# Patient Record
Sex: Female | Born: 1985 | Race: Black or African American | Hispanic: No | Marital: Married | Smoking: Never smoker
Health system: Southern US, Community
[De-identification: ages and names within clinical notes are randomized; demographics above are authoritative.]

## PROBLEM LIST (undated history)

## (undated) ENCOUNTER — Inpatient Hospital Stay (HOSPITAL_COMMUNITY): Payer: Self-pay

## (undated) DIAGNOSIS — B009 Herpesviral infection, unspecified: Secondary | ICD-10-CM

## (undated) DIAGNOSIS — F419 Anxiety disorder, unspecified: Secondary | ICD-10-CM

## (undated) DIAGNOSIS — I2699 Other pulmonary embolism without acute cor pulmonale: Secondary | ICD-10-CM

## (undated) DIAGNOSIS — I10 Essential (primary) hypertension: Secondary | ICD-10-CM

## (undated) DIAGNOSIS — IMO0002 Reserved for concepts with insufficient information to code with codable children: Secondary | ICD-10-CM

## (undated) DIAGNOSIS — R0602 Shortness of breath: Secondary | ICD-10-CM

## (undated) DIAGNOSIS — J189 Pneumonia, unspecified organism: Secondary | ICD-10-CM

## (undated) DIAGNOSIS — A64 Unspecified sexually transmitted disease: Secondary | ICD-10-CM

## (undated) DIAGNOSIS — M329 Systemic lupus erythematosus, unspecified: Secondary | ICD-10-CM

## (undated) DIAGNOSIS — N390 Urinary tract infection, site not specified: Secondary | ICD-10-CM

## (undated) DIAGNOSIS — B999 Unspecified infectious disease: Secondary | ICD-10-CM

## (undated) DIAGNOSIS — F32A Depression, unspecified: Secondary | ICD-10-CM

## (undated) DIAGNOSIS — R51 Headache: Secondary | ICD-10-CM

## (undated) HISTORY — DX: Unspecified sexually transmitted disease: A64

## (undated) HISTORY — DX: Shortness of breath: R06.02

## (undated) HISTORY — DX: Systemic lupus erythematosus, unspecified: M32.9

## (undated) HISTORY — PX: WISDOM TOOTH EXTRACTION: SHX21

## (undated) HISTORY — DX: Herpesviral infection, unspecified: B00.9

## (undated) HISTORY — DX: Other pulmonary embolism without acute cor pulmonale: I26.99

## (undated) HISTORY — DX: Reserved for concepts with insufficient information to code with codable children: IMO0002

## (undated) HISTORY — PX: ADENOIDECTOMY: SUR15

## (undated) SURGERY — SALPINGECTOMY, ROBOT-ASSISTED
Anesthesia: General | Laterality: Bilateral

---
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
## 2008-09-29 ENCOUNTER — Emergency Department (HOSPITAL_COMMUNITY): Admission: EM | Admit: 2008-09-29 | Discharge: 2008-09-29 | Payer: Self-pay | Admitting: Emergency Medicine

## 2008-11-15 ENCOUNTER — Emergency Department (HOSPITAL_COMMUNITY): Admission: EM | Admit: 2008-11-15 | Discharge: 2008-11-15 | Payer: Self-pay | Admitting: Emergency Medicine

## 2008-11-21 ENCOUNTER — Inpatient Hospital Stay (HOSPITAL_COMMUNITY): Admission: AD | Admit: 2008-11-21 | Discharge: 2008-11-21 | Payer: Self-pay | Admitting: Family Medicine

## 2008-11-22 ENCOUNTER — Inpatient Hospital Stay (HOSPITAL_COMMUNITY): Admission: AD | Admit: 2008-11-22 | Discharge: 2008-11-22 | Payer: Self-pay | Admitting: Family Medicine

## 2008-11-23 ENCOUNTER — Inpatient Hospital Stay (HOSPITAL_COMMUNITY): Admission: AD | Admit: 2008-11-23 | Discharge: 2008-11-23 | Payer: Self-pay | Admitting: Obstetrics & Gynecology

## 2008-12-10 ENCOUNTER — Ambulatory Visit: Payer: Self-pay | Admitting: Obstetrics and Gynecology

## 2008-12-17 ENCOUNTER — Ambulatory Visit: Payer: Self-pay | Admitting: Obstetrics and Gynecology

## 2009-01-16 ENCOUNTER — Ambulatory Visit: Payer: Self-pay | Admitting: Physician Assistant

## 2009-01-16 ENCOUNTER — Inpatient Hospital Stay (HOSPITAL_COMMUNITY): Admission: AD | Admit: 2009-01-16 | Discharge: 2009-01-16 | Payer: Self-pay | Admitting: Obstetrics and Gynecology

## 2009-02-21 DIAGNOSIS — A64 Unspecified sexually transmitted disease: Secondary | ICD-10-CM

## 2009-02-21 HISTORY — DX: Unspecified sexually transmitted disease: A64

## 2009-03-17 ENCOUNTER — Encounter: Payer: Self-pay | Admitting: Women's Health

## 2009-03-17 ENCOUNTER — Ambulatory Visit: Payer: Self-pay | Admitting: Women's Health

## 2009-03-17 ENCOUNTER — Other Ambulatory Visit: Admission: RE | Admit: 2009-03-17 | Discharge: 2009-03-17 | Payer: Self-pay | Admitting: Obstetrics and Gynecology

## 2009-04-21 ENCOUNTER — Ambulatory Visit: Payer: Self-pay | Admitting: Women's Health

## 2009-06-08 ENCOUNTER — Ambulatory Visit: Payer: Self-pay | Admitting: Women's Health

## 2009-08-19 ENCOUNTER — Inpatient Hospital Stay (HOSPITAL_COMMUNITY): Admission: AD | Admit: 2009-08-19 | Discharge: 2009-08-19 | Payer: Self-pay | Admitting: Obstetrics & Gynecology

## 2009-12-27 ENCOUNTER — Ambulatory Visit: Payer: Self-pay | Admitting: Women's Health

## 2010-04-12 ENCOUNTER — Emergency Department (HOSPITAL_COMMUNITY): Admission: EM | Admit: 2010-04-12 | Discharge: 2010-04-13 | Payer: Self-pay | Admitting: Emergency Medicine

## 2010-09-15 ENCOUNTER — Encounter: Payer: Self-pay | Admitting: Women's Health

## 2010-10-09 LAB — URINALYSIS, ROUTINE W REFLEX MICROSCOPIC
Bilirubin Urine: NEGATIVE
Glucose, UA: NEGATIVE mg/dL
Hgb urine dipstick: NEGATIVE
Specific Gravity, Urine: 1.025 (ref 1.005–1.030)
pH: 5.5 (ref 5.0–8.0)

## 2010-10-09 LAB — GC/CHLAMYDIA PROBE AMP, GENITAL: GC Probe Amp, Genital: NEGATIVE

## 2010-10-09 LAB — WET PREP, GENITAL
Trich, Wet Prep: NONE SEEN
Yeast Wet Prep HPF POC: NONE SEEN

## 2010-10-09 LAB — POCT PREGNANCY, URINE: Preg Test, Ur: NEGATIVE

## 2010-10-31 LAB — URINALYSIS, ROUTINE W REFLEX MICROSCOPIC
Bilirubin Urine: NEGATIVE
Glucose, UA: NEGATIVE mg/dL
Hgb urine dipstick: NEGATIVE
Protein, ur: NEGATIVE mg/dL
Specific Gravity, Urine: >1.03 — ABNORMAL HIGH (ref 1.005–1.030)
Urobilinogen, UA: 1 mg/dL (ref 0.0–1.0)

## 2010-10-31 LAB — WET PREP, GENITAL
Trich, Wet Prep: NONE SEEN
Yeast Wet Prep HPF POC: NONE SEEN

## 2010-10-31 LAB — GC/CHLAMYDIA PROBE AMP, GENITAL: GC Probe Amp, Genital: NEGATIVE

## 2010-11-01 LAB — URINALYSIS, ROUTINE W REFLEX MICROSCOPIC
Glucose, UA: NEGATIVE mg/dL
Glucose, UA: NEGATIVE mg/dL
Ketones, ur: NEGATIVE mg/dL
Leukocytes, UA: NEGATIVE
Protein, ur: NEGATIVE mg/dL
Protein, ur: NEGATIVE mg/dL
Specific Gravity, Urine: 1.02 (ref 1.005–1.030)
Urobilinogen, UA: 0.2 mg/dL (ref 0.0–1.0)
pH: 6 (ref 5.0–8.0)

## 2010-11-01 LAB — URINE MICROSCOPIC-ADD ON

## 2010-11-01 LAB — CBC
Hemoglobin: 13.1 g/dL (ref 12.0–15.0)
RBC: 4.31 MIL/uL (ref 3.87–5.11)
WBC: 5.3 10*3/uL (ref 4.0–10.5)

## 2010-11-01 LAB — HCG, QUANTITATIVE, PREGNANCY: hCG, Beta Chain, Quant, S: 638 m[IU]/mL — ABNORMAL HIGH (ref <5)

## 2010-11-02 LAB — HCG, QUANTITATIVE, PREGNANCY: hCG, Beta Chain, Quant, S: 2670 m[IU]/mL — ABNORMAL HIGH (ref <5)

## 2010-11-02 LAB — URINE MICROSCOPIC-ADD ON

## 2010-11-02 LAB — COMPREHENSIVE METABOLIC PANEL
CO2: 28 mEq/L (ref 19–32)
Calcium: 9.2 mg/dL (ref 8.4–10.5)
Creatinine, Ser: 0.64 mg/dL (ref 0.4–1.2)
GFR calc Af Amer: >60 mL/min (ref >60)
GFR calc non Af Amer: >60 mL/min (ref >60)
Glucose, Bld: 91 mg/dL (ref 70–99)
Sodium: 137 mEq/L (ref 135–145)
Total Protein: 7.5 g/dL (ref 6.0–8.3)

## 2010-11-02 LAB — URINALYSIS, ROUTINE W REFLEX MICROSCOPIC
Glucose, UA: NEGATIVE mg/dL
Ketones, ur: NEGATIVE mg/dL
Nitrite: NEGATIVE
Specific Gravity, Urine: 1.026 (ref 1.005–1.030)
pH: 7.5 (ref 5.0–8.0)

## 2010-11-02 LAB — GC/CHLAMYDIA PROBE AMP, GENITAL
Chlamydia, DNA Probe: NEGATIVE
GC Probe Amp, Genital: NEGATIVE

## 2010-11-02 LAB — DIFFERENTIAL
Lymphocytes Relative: 43 % (ref 12–46)
Lymphs Abs: 3 10*3/uL (ref 0.7–4.0)
Monocytes Relative: 7 % (ref 3–12)
Neutrophils Relative %: 46 % (ref 43–77)

## 2010-11-02 LAB — CBC
MCHC: 33.3 g/dL (ref 30.0–36.0)
MCV: 89.5 fL (ref 78.0–100.0)
RBC: 4.23 MIL/uL (ref 3.87–5.11)
RDW: 14.4 % (ref 11.5–15.5)

## 2010-11-02 LAB — WET PREP, GENITAL: Yeast Wet Prep HPF POC: NONE SEEN

## 2010-11-03 LAB — POCT PREGNANCY, URINE: Preg Test, Ur: NEGATIVE

## 2010-12-06 NOTE — Group Therapy Note (Signed)
NAME:  Hayley Webb, Hayley Webb NO.:  [id number]   MEDICAL RECORD NO.:  [id number]          PATIENT TYPE:  WOC   LOCATION:  [facility]                   FACILITY:  [facility]   PHYSICIAN:  Argentina Donovan, MD        DATE OF BIRTH:  1986/03/30   DATE OF SERVICE:                                  CLINIC NOTE   REASON FOR VISIT:  Followup SAB on Nov 23, 2008.   HISTORY:  This is a G1, P0-0-1-0, who desires pregnancy and was seen Nov 21, 2008, cramping, had a viable IUP 5 weeks 6 days.  Then on Nov 23, 2008, she was seen with heavy bleeding and her ultrasound then showed  distended endocervical canal with 1.6 cm debris consistent with SAB in  progress.  Since that time, she continued to have some bleeding and  spotting until it stopped a week ago.  Then after a few days, she again  had some moderate amount of bleeding for couple of days, and then light  spotting which stopped yesterday.  She has not seen anything today.   ALLERGIES:  None.   MEDICATIONS:  Albuterol p.r.n.   IMMUNIZATIONS:  All the usual childhood immunizations including flu.   OB HISTORY:  Her menses are q.28 days in regular with moderate flow.  No  intermenstrual bleeding usually.  She is not using contraception.  She  has never had abnormal Pap smears or STIs.  No surgeries.   FAMILY HISTORY:  Significant for grandmother with diabetes.  Grandfather  with hypertension.  Grandmother, paternal, and grandmother, maternal  both with pancreas and lung cancer.   PAST MEDICAL HISTORY:  Essentially negative except for asthma.   SOCIAL HISTORY:  She lives with her mother and brother, is employed,  nonsmoker, nondrinker.  Drinks 1-2 caffeinated beverages a day.  No  history of abuse.   REVIEW OF SYSTEMS:  Essentially negative, although she has had some  nausea, vomiting with the pregnancy.  As noted above, has had some brown  vaginal spotting that she feels as a bad odor.   PHYSICAL EXAMINATION:  VITAL SIGNS:  BP  124/85, weight 286 pounds,  height 5 feet 8 inches.  GENERAL:  Pleasant, NAD.  HEENT:  Normocephalic.  ABDOMEN:  Obese, soft, and nontender.  PELVIC:  NEFG, BUS negative.  Speculum exam, vagina and cervix clean  except for scant of amount brown old blood noted. On insertion of Q-Tip  at external os, essentially no vaginal discharge.  Bimanual cervix,  nulliparous, long, closed.  Uterus, NSSP.  Adnexa, no tenderness or  masses.   ASSESSMENT:  Status post completed spontaneous abortion.   PLAN:  RX, prenatal vitamin to start one a day, advised to keep a  menstrual calendar and to wait one normal cycle before attempting  pregnancy again. Of note, she plans to go to [facility] when  she becomes pregnant.     ______________________________  Caren Griffins, [name]    ______________________________  Argentina Donovan, MD    DP/MEDQ  D:  12/10/2008  T:  12/11/2008  Job:  [id number]

## 2011-01-08 ENCOUNTER — Emergency Department (HOSPITAL_COMMUNITY)
Admission: EM | Admit: 2011-01-08 | Discharge: 2011-01-08 | Disposition: A | Discharge status: Home / Self Care | Payer: BC Managed Care – PPO | Attending: Emergency Medicine | Admitting: Emergency Medicine

## 2011-01-08 ENCOUNTER — Emergency Department (HOSPITAL_COMMUNITY): Payer: BC Managed Care – PPO

## 2011-01-08 DIAGNOSIS — IMO0001 Reserved for inherently not codable concepts without codable children: Secondary | ICD-10-CM | POA: Insufficient documentation

## 2011-01-08 DIAGNOSIS — J069 Acute upper respiratory infection, unspecified: Secondary | ICD-10-CM | POA: Insufficient documentation

## 2011-01-08 DIAGNOSIS — J45909 Unspecified asthma, uncomplicated: Secondary | ICD-10-CM | POA: Insufficient documentation

## 2011-01-08 DIAGNOSIS — M329 Systemic lupus erythematosus, unspecified: Secondary | ICD-10-CM | POA: Insufficient documentation

## 2011-01-08 DIAGNOSIS — R0602 Shortness of breath: Secondary | ICD-10-CM | POA: Insufficient documentation

## 2011-01-26 ENCOUNTER — Ambulatory Visit (INDEPENDENT_AMBULATORY_CARE_PROVIDER_SITE_OTHER): Payer: BC Managed Care – PPO | Admitting: Gynecology

## 2011-01-26 DIAGNOSIS — N898 Other specified noninflammatory disorders of vagina: Secondary | ICD-10-CM

## 2011-01-26 DIAGNOSIS — Z113 Encounter for screening for infections with a predominantly sexual mode of transmission: Secondary | ICD-10-CM

## 2011-01-26 DIAGNOSIS — R82998 Other abnormal findings in urine: Secondary | ICD-10-CM

## 2011-01-26 DIAGNOSIS — N949 Unspecified condition associated with female genital organs and menstrual cycle: Secondary | ICD-10-CM

## 2011-01-26 DIAGNOSIS — B373 Candidiasis of vulva and vagina: Secondary | ICD-10-CM

## 2011-02-03 ENCOUNTER — Encounter: Payer: BC Managed Care – PPO | Admitting: Women's Health

## 2011-03-21 DIAGNOSIS — B009 Herpesviral infection, unspecified: Secondary | ICD-10-CM | POA: Insufficient documentation

## 2011-03-21 DIAGNOSIS — J45909 Unspecified asthma, uncomplicated: Secondary | ICD-10-CM | POA: Insufficient documentation

## 2011-03-29 ENCOUNTER — Other Ambulatory Visit: Payer: Self-pay

## 2011-03-29 ENCOUNTER — Ambulatory Visit (INDEPENDENT_AMBULATORY_CARE_PROVIDER_SITE_OTHER): Payer: BC Managed Care – PPO | Admitting: Women's Health

## 2011-03-29 ENCOUNTER — Encounter: Payer: Self-pay | Admitting: Women's Health

## 2011-03-29 ENCOUNTER — Other Ambulatory Visit (HOSPITAL_COMMUNITY)
Admission: RE | Admit: 2011-03-29 | Discharge: 2011-03-29 | Disposition: A | Discharge status: Home / Self Care | Payer: BC Managed Care – PPO | Source: Ambulatory Visit | Attending: Women's Health | Admitting: Women's Health

## 2011-03-29 VITALS — BP 130/72 | Ht 69.25 in | Wt 310.0 lb

## 2011-03-29 DIAGNOSIS — F419 Anxiety disorder, unspecified: Secondary | ICD-10-CM

## 2011-03-29 DIAGNOSIS — Z23 Encounter for immunization: Secondary | ICD-10-CM

## 2011-03-29 DIAGNOSIS — N920 Excessive and frequent menstruation with regular cycle: Secondary | ICD-10-CM

## 2011-03-29 DIAGNOSIS — Z01419 Encounter for gynecological examination (general) (routine) without abnormal findings: Secondary | ICD-10-CM | POA: Insufficient documentation

## 2011-03-29 DIAGNOSIS — F411 Generalized anxiety disorder: Secondary | ICD-10-CM

## 2011-03-29 DIAGNOSIS — B009 Herpesviral infection, unspecified: Secondary | ICD-10-CM

## 2011-03-29 DIAGNOSIS — B373 Candidiasis of vulva and vagina: Secondary | ICD-10-CM

## 2011-03-29 DIAGNOSIS — N760 Acute vaginitis: Secondary | ICD-10-CM

## 2011-03-29 DIAGNOSIS — R110 Nausea: Secondary | ICD-10-CM

## 2011-03-29 DIAGNOSIS — A499 Bacterial infection, unspecified: Secondary | ICD-10-CM

## 2011-03-29 DIAGNOSIS — M329 Systemic lupus erythematosus, unspecified: Secondary | ICD-10-CM | POA: Insufficient documentation

## 2011-03-29 DIAGNOSIS — N898 Other specified noninflammatory disorders of vagina: Secondary | ICD-10-CM

## 2011-03-29 DIAGNOSIS — Z113 Encounter for screening for infections with a predominantly sexual mode of transmission: Secondary | ICD-10-CM

## 2011-03-29 MED ORDER — METRONIDAZOLE 0.75 % VA GEL: VAGINAL | Status: AC

## 2011-03-29 MED ORDER — CITALOPRAM HYDROBROMIDE 40 MG PO TABS: 40.0000 mg | ORAL_TABLET | Freq: Every day | ORAL | Status: DC

## 2011-03-29 MED ORDER — VALACYCLOVIR HCL 500 MG PO TABS: 500.0000 mg | ORAL_TABLET | Freq: Two times a day (BID) | ORAL | Status: DC

## 2011-03-29 MED ORDER — FLUCONAZOLE 150 MG PO TABS: 150.0000 mg | ORAL_TABLET | Freq: Once | ORAL | Status: AC

## 2011-03-29 NOTE — Progress Notes (Signed)
Hayley Webb 08/21/1985 [id number]    History:    The patient presents for annual exam.  She has been diagnosed with lupus this past year. She has a history of asthma and depression. She is a CNA in an Alzheimer's unit.  Past medical history, past surgical history, family history and social history were all reviewed and documented in the EPIC chart.   ROS:  A  ROS was performed and pertinent positives and negatives are included in the history.  Exam:  There were no vitals filed for this visit.  General appearance: Obese Head/Neck:  Normal, without cervical or supraclavicular adenopathy. Thyroid:  Symmetrical, normal in size, without palpable masses or nodularity. Respiratory  Effort:  Normal  Auscultation:  Clear without wheezing or rhonchi Cardiovascular  Auscultation:  Regular rate, without rubs, murmurs or gallops  Edema/varicosities:  Not grossly evident Abdominal  Soft,nontender, without masses, guarding or rebound.  Liver/spleen:  No organomegaly noted  Hernia:  None appreciated  Skin  Inspection:  Grossly normal  Palpation:  Grossly normal Neurologic/psychiatric  Orientation:  Normal with appropriate conversation.  Mood/affect:  Normal  Genitourinary    Breasts: Examined lying and sitting.     Right: Without masses, retractions, discharge or axillary adenopathy.     Left: Without masses, retractions, discharge or axillary adenopathy.   Inguinal/mons:  Normal without inguinal adenopathy  External genitalia:  Normal  BUS/Urethra/Skene's glands:  Normal  Bladder:  Normal  Vagina:  Normal  Cervix:  Normal  Uterus:  difficult to palpate size due to obesity,  Midline and mobile  Adnexa/parametria:     Rt: Without masses or tenderness.   Lt: Without masses or tenderness.  Anus and perineum: Normal  Digital rectal exam: Normal sphincter tone without palpated masses or tenderness  Assessment/Plan:  25 y.o. SBF G1P0 for annual exam.  Monthly 7 day cycle 4 of which  are heavy, condoms for contraception. She states her cycles have become heavier in the past 2 years. Also complained of increased discharge.  BV and yeast, obesity, Lupus, Asthma, Depression  Plan: MetroGel vaginal cream 1 applicator at bedtime x5, and Diflucan 150 by mouth x1 dose. Prescriptions and instructions were given and reviewed. She does have a history of lupus and is on plaque on no twice a day, and did review Mirena IUDs, to help  menorrhagia and for contraception. Reviewed slight risk for perforation hemorrhage infection. Will schedule an ultrasound after her next cycle to assess size of uterus due to an inadequate pelvic exam. She does have a history of HSV, rare outbreaks, prescription for Valtrex 500 by mouth twice a day for 3-5 days when necessary was given. Third and final Gardasil was given today. SBEs, exercise, cutting calories, Weight Watchers for weight loss was encouraged. Pap, GC and Chlamydia only today declines need for HIV hep B,C or RPR she has had them drawn at work and were negative. She is currently on Celexa 40 daily which has kept her anxiety depression stable, and plaqunil for lupus per PC. Labs and medications are through her primary care.  Harrington Challenger [facility], 9:34 AM 03/29/2011

## 2011-03-30 LAB — POCT URINE PREGNANCY: Preg Test, Ur: NEGATIVE

## 2011-07-24 ENCOUNTER — Emergency Department (HOSPITAL_COMMUNITY)
Admission: EM | Admit: 2011-07-24 | Discharge: 2011-07-25 | Discharge status: Against Medical Advice | Payer: BC Managed Care – PPO | Attending: Emergency Medicine | Admitting: Emergency Medicine

## 2011-07-24 ENCOUNTER — Encounter (HOSPITAL_COMMUNITY): Payer: Self-pay | Admitting: Nurse Practitioner

## 2011-07-24 DIAGNOSIS — Z0389 Encounter for observation for other suspected diseases and conditions ruled out: Secondary | ICD-10-CM | POA: Insufficient documentation

## 2011-07-24 NOTE — ED Notes (Signed)
Pt c/o body aches, chills, cough onset yesterday. A&Ox4, resp e/u

## 2011-07-24 NOTE — ED Notes (Signed)
Called patient no answer.

## 2011-09-10 ENCOUNTER — Inpatient Hospital Stay (HOSPITAL_COMMUNITY): Payer: BC Managed Care – PPO

## 2011-09-10 ENCOUNTER — Encounter (HOSPITAL_COMMUNITY): Payer: Self-pay | Admitting: *Deleted

## 2011-09-10 ENCOUNTER — Inpatient Hospital Stay (HOSPITAL_COMMUNITY)
Admission: AD | Admit: 2011-09-10 | Discharge: 2011-09-10 | Disposition: A | Discharge status: Home / Self Care | Payer: BC Managed Care – PPO | Source: Ambulatory Visit | Attending: Gynecology | Admitting: Gynecology

## 2011-09-10 DIAGNOSIS — B009 Herpesviral infection, unspecified: Secondary | ICD-10-CM | POA: Insufficient documentation

## 2011-09-10 DIAGNOSIS — J45909 Unspecified asthma, uncomplicated: Secondary | ICD-10-CM | POA: Insufficient documentation

## 2011-09-10 DIAGNOSIS — N938 Other specified abnormal uterine and vaginal bleeding: Secondary | ICD-10-CM | POA: Insufficient documentation

## 2011-09-10 DIAGNOSIS — N949 Unspecified condition associated with female genital organs and menstrual cycle: Secondary | ICD-10-CM | POA: Insufficient documentation

## 2011-09-10 DIAGNOSIS — N926 Irregular menstruation, unspecified: Secondary | ICD-10-CM

## 2011-09-10 DIAGNOSIS — M329 Systemic lupus erythematosus, unspecified: Secondary | ICD-10-CM | POA: Insufficient documentation

## 2011-09-10 LAB — URINALYSIS, ROUTINE W REFLEX MICROSCOPIC
Ketones, ur: NEGATIVE mg/dL
Leukocytes, UA: NEGATIVE
Nitrite: NEGATIVE
Protein, ur: NEGATIVE mg/dL
Urobilinogen, UA: 0.2 mg/dL (ref 0.0–1.0)
pH: 6 (ref 5.0–8.0)

## 2011-09-10 LAB — WET PREP, GENITAL
Clue Cells Wet Prep HPF POC: NONE SEEN
Trich, Wet Prep: NONE SEEN

## 2011-09-10 LAB — CBC
MCHC: 31.6 g/dL (ref 30.0–36.0)
Platelets: 335 10*3/uL (ref 150–400)
RDW: 13.9 % (ref 11.5–15.5)
WBC: 6 10*3/uL (ref 4.0–10.5)

## 2011-09-10 MED ORDER — IBUPROFEN 800 MG PO TABS: 800.0000 mg | ORAL_TABLET | Freq: Three times a day (TID) | ORAL | Status: AC | PRN

## 2011-09-10 MED ORDER — OXYCODONE-ACETAMINOPHEN 5-325 MG PO TABS
1.0000 | ORAL_TABLET | Freq: Once | ORAL | Status: AC
  Administered 2011-09-10: 1 via ORAL
  Filled 2011-09-10: qty 2

## 2011-09-10 MED ORDER — KETOROLAC TROMETHAMINE 60 MG/2ML IM SOLN
60.0000 mg | Freq: Once | INTRAMUSCULAR | Status: AC
  Administered 2011-09-10: 60 mg via INTRAMUSCULAR
  Filled 2011-09-10: qty 2

## 2011-09-10 NOTE — ED Provider Notes (Signed)
History     Chief Complaint  Patient presents with  . Vaginal Bleeding  . Abdominal Pain  . Back Pain   HPI Care assumed from Pamelia Hoit, NP, please see her note.     Past Medical History  Diagnosis Date  . 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 HSV infection   . Asthma   . Lupus     Past Surgical History  Procedure Date  . Tonsillectomy and adenoidectomy 1998  . Mouth surgery     2 teeth removed- 1 wisdom and 1 in front of it    Family History  Problem Relation Age of Onset  . Diabetes Maternal Aunt   . Cancer Maternal Grandmother 72    COLON CA  . Diabetes Maternal Grandmother   . Hypertension Maternal Grandfather   . Asthma Mother   . Asthma Brother   . Anesthesia problems Neg Hx   . Hypotension Neg Hx   . Malignant hyperthermia Neg Hx   . Pseudochol deficiency Neg Hx     History  Substance Use Topics  . Smoking status: Never Smoker   . Smokeless tobacco: Never Used  . Alcohol Use: No    Allergies:  Allergies  Allergen Reactions  . Prednisone Swelling and Rash    Prescriptions prior to admission  Medication Sig Dispense Refill  . albuterol (PROVENTIL HFA;VENTOLIN HFA) 108 (90 BASE) MCG/ACT inhaler Inhale 2 puffs into the lungs every 6 (six) hours as needed. For shortness of breath       . citalopram (CELEXA) 40 MG tablet Take 40 mg by mouth daily.        . diphenhydramine-acetaminophen (TYLENOL PM) 25-500 MG TABS Take 1 tablet by mouth at bedtime as needed. For sleep      . Hydroxychloroquine Sulfate (PLAQUENIL PO) Take 10 mg by mouth daily.       . methylPREDNISolone (MEDROL) 8 MG tablet Take 8 mg by mouth daily.        ROS Physical Exam   Blood pressure 136/83, pulse 83, temperature 97.1 F (36.2 C), temperature source Oral, resp. rate 18, height 5\' 9"  (1.753 m), last menstrual period 08/26/2011.  Physical Exam  MAU Course  Procedures Results for orders placed during the hospital encounter of 09/10/11 (from  the past 24 hour(s))  URINALYSIS, ROUTINE W REFLEX MICROSCOPIC     Status: Abnormal   Collection Time   09/10/11  4:25 PM      Component Value Range   Color, Urine YELLOW  YELLOW    APPearance HAZY (*) CLEAR    Specific Gravity, Urine >1.030 (*) 1.005 - 1.030    pH 6.0  5.0 - 8.0    Glucose, UA NEGATIVE  NEGATIVE (mg/dL)   Hgb urine dipstick LARGE (*) NEGATIVE    Bilirubin Urine NEGATIVE  NEGATIVE    Ketones, ur NEGATIVE  NEGATIVE (mg/dL)   Protein, ur NEGATIVE  NEGATIVE (mg/dL)   Urobilinogen, UA 0.2  0.0 - 1.0 (mg/dL)   Nitrite NEGATIVE  NEGATIVE    Leukocytes, UA NEGATIVE  NEGATIVE   URINE MICROSCOPIC-ADD ON     Status: Abnormal   Collection Time   09/10/11  4:25 PM      Component Value Range   Squamous Epithelial / LPF FEW (*) RARE    WBC, UA 0-2  <3 (WBC/hpf)   RBC / HPF 21-50  <3 (RBC/hpf)   Bacteria, UA FEW (*) RARE   POCT PREGNANCY, URINE  Status: Normal   Collection Time   09/10/11  4:29 PM      Component Value Range   Preg Test, Ur NEGATIVE  NEGATIVE   WET PREP, GENITAL     Status: Abnormal   Collection Time   09/10/11  5:27 PM      Component Value Range   Yeast Wet Prep HPF POC NONE SEEN  NONE SEEN    Trich, Wet Prep NONE SEEN  NONE SEEN    Clue Cells Wet Prep HPF POC NONE SEEN  NONE SEEN    WBC, Wet Prep HPF POC FEW (*) NONE SEEN   CBC     Status: Abnormal   Collection Time   09/10/11  5:45 PM      Component Value Range   WBC 6.0  4.0 - 10.5 (K/uL)   RBC 4.09  3.87 - 5.11 (MIL/uL)   Hemoglobin 11.5 (*) 12.0 - 15.0 (g/dL)   HCT 16.1  09.6 - 04.5 (%)   MCV 89.0  78.0 - 100.0 (fL)   MCH 28.1  26.0 - 34.0 (pg)   MCHC 31.6  30.0 - 36.0 (g/dL)   RDW 40.9  81.1 - 91.4 (%)   Platelets 335  150 - 400 (K/uL)   US Transvaginal Non-ob  09/10/2011  *RADIOLOGY REPORT*  Clinical Data: Hematuria.  Pelvic pain.  TRANSABDOMINAL AND TRANSVAGINAL ULTRASOUND OF PELVIS Technique:  Both transabdominal and transvaginal ultrasound examinations of the pelvis were performed.  Transabdominal technique was performed for global imaging of the pelvis including uterus, ovaries, adnexal regions, and pelvic cul-de-sac.  Comparison: Previous OB ultrasound.   It was necessary to proceed with endovaginal exam following the transabdominal exam to visualize the .  Findings:  Uterus: Normal in size and appearance  Endometrium: Normal in thickness and appearance  Right ovary:  Normal appearance/no adnexal mass  Left ovary: Normal appearance/no adnexal mass  Other findings: No free fluid  IMPRESSION: Normal study. No evidence of pelvic mass or other significant abnormality.  Original Report Authenticated By: Darrol Angel, M.D.   US Pelvis Complete  09/10/2011  *RADIOLOGY REPORT*  Clinical Data: Hematuria.  Pelvic pain.  TRANSABDOMINAL AND TRANSVAGINAL ULTRASOUND OF PELVIS Technique:  Both transabdominal and transvaginal ultrasound examinations of the pelvis were performed. Transabdominal technique was performed for global imaging of the pelvis including uterus, ovaries, adnexal regions, and pelvic cul-de-sac.  Comparison: Previous OB ultrasound.   It was necessary to proceed with endovaginal exam following the transabdominal exam to visualize the .  Findings:  Uterus: Normal in size and appearance  Endometrium: Normal in thickness and appearance  Right ovary:  Normal appearance/no adnexal mass  Left ovary: Normal appearance/no adnexal mass  Other findings: No free fluid  IMPRESSION: Normal study. No evidence of pelvic mass or other significant abnormality.  Original Report Authenticated By: Darrol Angel, M.D.   Pain improved with Toradol 60 mg IM, but increased after ultrasound, gave Percocet 5/325 x 1 prior to d/c  Attempted to contact Dr. Tresa Res by pager - no response, pt not acute, all results normal, she states that she usually sees Maryelizabeth Rowan, NP in the office and could follow up with her    Assessment and Plan  26 y.o. G1P0010 with DUB, pelvic pain - normal labs and u/s  today D/C home with Motrin 800 mg tid for pain F/U in office if pain does not improve or if irregular bleeding continues FRAZIER,NATALIE 09/10/2011, 8:30 PM I never received a page despite confirming beeper  was turned on and MAU has my correct contact info.

## 2011-09-10 NOTE — Progress Notes (Signed)
Pt reports lmp as 08/26/2011. Pt now reports bleeding again on Friday 2/15. Pt states that there is blood in her urine. Pt reports sharp pain on L side of abdomen since today. Back pain for one month.

## 2011-09-10 NOTE — Discharge Instructions (Signed)
Today, your labs and ultrasound are completely normal. You can take Motrin for your pain. If your symptoms continue or worsen, you should follow up with your regular provider in the office.

## 2011-09-10 NOTE — ED Provider Notes (Signed)
History     Chief Complaint  Patient presents with  . Vaginal Bleeding  . Abdominal Pain  . Back Pain   HPI Pt is not pregnant and presents with abnormal uterine bleeding with LMP 2/2 bled 6 days normal heavy; on 2/15 she started bleeding with urination with clots- dark clots/  Her cramping started today.  She took a Tylenol PM last night which helped her sleep but has not taken anything for pain today.  She denies burning or pain with urination.  Pt's history is significant for Lupus and is on Plaquenil and Medrol.  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   Past Medical History  Diagnosis Date  . 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 HSV infection   . Asthma   . Lupus     Past Surgical History  Procedure Date  . Tonsillectomy and adenoidectomy 1998  . Mouth surgery     2 teeth removed- 1 wisdom and 1 in front of it    Family History  Problem Relation Age of Onset  . Diabetes Maternal Aunt   . Cancer Maternal Grandmother 72    COLON CA  . Diabetes Maternal Grandmother   . Hypertension Maternal Grandfather   . Asthma Mother   . Asthma Brother   . Anesthesia problems Neg Hx   . Hypotension Neg Hx   . Malignant hyperthermia Neg Hx   . Pseudochol deficiency Neg Hx     History  Substance Use Topics  . Smoking status: Never Smoker   . Smokeless tobacco: Never Used  . Alcohol Use: No    Allergies:  Allergies  Allergen Reactions  . Prednisone Swelling and Rash    Prescriptions prior to admission  Medication Sig Dispense Refill  . albuterol (PROVENTIL HFA;VENTOLIN HFA) 108 (90 BASE) MCG/ACT inhaler Inhale 2 puffs into the lungs every 6 (six) hours as needed. For shortness of breath       . citalopram (CELEXA) 40 MG tablet Take 40 mg by mouth daily.        . diphenhydramine-acetaminophen (TYLENOL PM) 25-500 MG TABS Take 1 tablet by mouth at bedtime as needed. For sleep      . Hydroxychloroquine Sulfate (PLAQUENIL PO) Take 10 mg by mouth daily.         . methylPREDNISolone (MEDROL) 8 MG tablet Take 8 mg by mouth daily.        Review of Systems  Constitutional: Negative for fever and chills.  Gastrointestinal: Negative for nausea, vomiting, abdominal pain, diarrhea and constipation.  Genitourinary: Negative for dysuria, urgency and frequency.   Physical Exam   Blood pressure 136/83, pulse 83, temperature 97.1 F (36.2 C), temperature source Oral, resp. rate 18, height 5\' 9"  (1.753 m), last menstrual period 08/26/2011.  Physical Exam  Constitutional: She is oriented to person, place, and time. She appears well-developed and well-nourished.  HENT:  Head: Normocephalic.  Eyes: Pupils are equal, round, and reactive to light.  Neck: Normal range of motion. Neck supple.  Cardiovascular: Normal rate.   Respiratory: Effort normal.  GI: Soft. She exhibits no distension. There is no tenderness. There is no rebound and no guarding.  Genitourinary:       Mod amount of blood in vault; cervix clean; uterus NSSC adnexal tenderness bilaterally without rebound  Musculoskeletal: Normal range of motion.  Neurological: She is alert and oriented to person, place, and time.  Skin: Skin is warm and dry.  Psychiatric: She has a normal  mood and affect.    MAU Course  Procedures  Care transferred to Georges Mouse, [name]   Assessment and Plan    [facility] 09/10/2011, 5:12 PM

## 2011-09-11 LAB — GC/CHLAMYDIA PROBE AMP, GENITAL: Chlamydia, DNA Probe: NEGATIVE

## 2011-10-10 ENCOUNTER — Other Ambulatory Visit: Payer: Self-pay

## 2011-10-10 ENCOUNTER — Encounter (HOSPITAL_COMMUNITY): Payer: Self-pay | Admitting: Emergency Medicine

## 2011-10-10 ENCOUNTER — Emergency Department (HOSPITAL_COMMUNITY): Payer: BC Managed Care – PPO

## 2011-10-10 ENCOUNTER — Emergency Department (HOSPITAL_COMMUNITY)
Admission: EM | Admit: 2011-10-10 | Discharge: 2011-10-10 | Discharge status: Against Medical Advice | Payer: BC Managed Care – PPO | Attending: Emergency Medicine | Admitting: Emergency Medicine

## 2011-10-10 DIAGNOSIS — R0602 Shortness of breath: Secondary | ICD-10-CM | POA: Insufficient documentation

## 2011-10-10 DIAGNOSIS — R079 Chest pain, unspecified: Secondary | ICD-10-CM | POA: Insufficient documentation

## 2011-10-10 HISTORY — DX: Depression, unspecified: F32.A

## 2011-10-10 LAB — COMPREHENSIVE METABOLIC PANEL
AST: 14 U/L (ref 0–37)
Albumin: 3.5 g/dL (ref 3.5–5.2)
Alkaline Phosphatase: 107 U/L (ref 39–117)
BUN: 9 mg/dL (ref 6–23)
Chloride: 100 mEq/L (ref 96–112)
Creatinine, Ser: 0.85 mg/dL (ref 0.50–1.10)
Potassium: 4 mEq/L (ref 3.5–5.1)
Total Bilirubin: 0.2 mg/dL — ABNORMAL LOW (ref 0.3–1.2)
Total Protein: 7.5 g/dL (ref 6.0–8.3)

## 2011-10-10 LAB — TROPONIN I: Troponin I: <0.3 ng/mL (ref <0.30)

## 2011-10-10 NOTE — ED Notes (Signed)
Pt's aunt reported that pt's oxygen was low; looked in chart, and pt was previously 99% on RA in triage; updated aunt and rechecked spo2; spo2 100% and HR 81

## 2011-10-10 NOTE — ED Notes (Signed)
Chest pain that started yesterday some sob has some nausea has hx of lupus

## 2011-10-10 NOTE — ED Notes (Signed)
Patient spoke with Charge RN, signed AMA form.   Patient to come back in an hour to be examined.

## 2011-11-15 ENCOUNTER — Ambulatory Visit (INDEPENDENT_AMBULATORY_CARE_PROVIDER_SITE_OTHER): Payer: BC Managed Care – PPO | Admitting: Women's Health

## 2011-11-15 ENCOUNTER — Encounter: Payer: Self-pay | Admitting: Women's Health

## 2011-11-15 DIAGNOSIS — N926 Irregular menstruation, unspecified: Secondary | ICD-10-CM

## 2011-11-15 DIAGNOSIS — Z8742 Personal history of other diseases of the female genital tract: Secondary | ICD-10-CM

## 2011-11-15 LAB — TSH: TSH: 1.211 u[IU]/mL (ref 0.350–4.500)

## 2011-11-15 NOTE — Patient Instructions (Signed)
Lupus Lupus (also called systemic lupus erythematosus, SLE) is a disorder of the body's natural defense system (immune system). In lupus, the immune system attacks various areas of the body (autoimmune disease). CAUSES The cause is unknown. However, lupus runs in families. Certain genes can make you more likely to develop lupus. It is 10 times more common in women than in men. Lupus is also more common in African Americans and Asians. Other factors also play a role, such as viruses (Epstein-Barr virus, EBV), stress, hormones, cigarette smoke, and certain drugs. SYMPTOMS Lupus can affect many parts of the body, including the joints, skin, kidneys, lungs, heart, nervous system, and blood vessels. The signs and symptoms of lupus differ from person to person. The disease can range from mild to life-threatening. Typical features of lupus include:  Butterfly-shaped rash over the face.   Arthritis involving one or more joints.   Kidney disease.   Fever, weight loss, hair loss, fatigue.   Poor circulation in the fingers and toes (Raynaud's disease).   Chest pain when taking deep breaths. Abdominal pain may also occur.   Skin rash in areas exposed to the sun.   Sores in the mouth and nose.  DIAGNOSIS Diagnosing lupus can take a long time and is often difficult. An exam and an accurate account of your symptoms and health problems is very important. Blood tests are necessary, though no single test can confirm or rule out lupus. Most people with lupus test positive for antinuclear antibodies (ANA) on a blood test. Additional blood tests, a urine test (urinalysis), and sometimes a kidney or skin tissue sample (biopsy) can help to confirm or rule out lupus. TREATMENT There is no cure for lupus. Your caregiver will develop a treatment plan based on your age, sex, health, symptoms, and lifestyle. The goals are to prevent flares, to treat them when they do occur, and to minimize organ damage and  complications. How the disease may affect each person varies widely. Most people with lupus can live normal lives, but this disorder must be carefully monitored. Treatment must be adjusted as necessary to prevent serious complications. Medicines used for treatment:  Nonsteroidal anti-inflammatory drugs (NSAIDs) decrease inflammation and can help with chest pain, joint pain, and fevers. Examples include ibuprofen and naproxen.   Antimalarial drugs were designed to treat malaria. They also treat fatigue, joint pain, skin rashes, and inflammation of the lungs in patients with lupus.   Corticosteroids are powerful hormones that rapidly suppress inflammation. The lowest dose with the highest benefit will be chosen. They can be given by cream, pills, injections, and through the vein (intravenously).   Immunosuppressive drugs block the making of immune cells. They may be used for kidney or nerve disease.  HOME CARE INSTRUCTIONS  Exercise. Low-impact activities can usually help keep joints flexible without being too strenuous.   Rest after periods of exercise.   Avoid excessive sun exposure.   Follow proper nutrition and take supplements as recommended by your caregiver.   Stress management can be helpful.  SEEK MEDICAL CARE IF:  You have increased fatigue.   You develop pain.   You develop a rash.   You have an oral temperature above 102 F (38.9 C).   You develop abdominal discomfort.   You develop a headache.   You experience dizziness.  FOR MORE INFORMATION National Institute of Neurological Disorders and Stroke: www.ninds.nih.gov American College of Rheumatology: www.rheumatology.org National Institute of Arthritis and Musculoskeletal and Skin Diseases: www.niams.nih.gov Document Released: 06/30/2002 Document Revised:   06/29/2011 Document Reviewed: 10/21/2009 Exit[employer] Patient Information 2012 [employer]. 

## 2011-11-15 NOTE — Progress Notes (Signed)
Patient ID: Hayley Webb, female   DOB: 06/27/1986, 26 y.o.   MRN: [id number] Presents for followup, was noted to have a 2 cm left ovarian cyst most likely follicular in nature that was noted on a CT scan performed on 11/10/11 for left lower abdominal pain. CT scan showed normal liver, spleen, gallbladder, pancreas, adrenal glands, kidneys, with no inflammation noted at the appendix. History of monthly cycles, states last several months cycles are closer than every 28 days, LMP 2 weeks ago.  Uses condoms for contraception, negative cultures with same partner, diagnosed with lupus and is having some problems/does have followup scheduled. Currently on plaquenil.  Exam: No CVAT, abdomen obese nontender, external genitalia within normal limits, speculum exam cervix is pink healthy with no visible blood. Bimanual no CMT or fullness noted.  Left ovarian cyst Irregular cycles  Plan: TSH and prolactin. Return to office in 2 months week after cycle for repeat ultrasound. Reviewed ultrasound better  for assessing ovarian cysts. Encouraged MVI daily, Hemoglobin 11.5 at ER visit. Instructed to keep menstrual record, continue condoms, Plan B emergency contraception reviewed.

## 2011-12-27 ENCOUNTER — Ambulatory Visit (INDEPENDENT_AMBULATORY_CARE_PROVIDER_SITE_OTHER): Payer: BC Managed Care – PPO | Admitting: Women's Health

## 2011-12-27 ENCOUNTER — Encounter: Payer: Self-pay | Admitting: Women's Health

## 2011-12-27 DIAGNOSIS — Z113 Encounter for screening for infections with a predominantly sexual mode of transmission: Secondary | ICD-10-CM

## 2011-12-27 DIAGNOSIS — N898 Other specified noninflammatory disorders of vagina: Secondary | ICD-10-CM

## 2011-12-27 LAB — WET PREP FOR TRICH, YEAST, CLUE
Clue Cells Wet Prep HPF POC: NONE SEEN
Trich, Wet Prep: NONE SEEN
Yeast Wet Prep HPF POC: NONE SEEN

## 2011-12-27 LAB — RPR

## 2011-12-27 NOTE — Progress Notes (Signed)
Patient ID: Hayley Webb, female   DOB: 04-06-1986, 26 y.o.   MRN: [id number] Presents with the complaint of a discharge, states partner has been unfaithful. Having regular monthly cycle, used condoms prior. Denies any urinary symptoms or fever. Occasional slight lower abdominal cramping.  Exam: External genitalia within normal limits, speculum exam cervix pink healthy without lesion and minimal discharge no odor noted. Wet prep negative. GC Chlamydia culture taken and is pending. Bimanual no CMT or adnexal fullness or tenderness. Morbid obesity  STD screen  Plan: GC/Chlamydia, HIV, hep B and C., RPR. Reviewed importance of condoms if become sexually active. Discussed cutting calories and increasing exercise for weight loss for health.

## 2011-12-28 ENCOUNTER — Other Ambulatory Visit: Payer: Self-pay | Admitting: Women's Health

## 2011-12-28 DIAGNOSIS — B999 Unspecified infectious disease: Secondary | ICD-10-CM

## 2011-12-28 LAB — GC/CHLAMYDIA PROBE AMP, GENITAL: GC Probe Amp, Genital: NEGATIVE

## 2011-12-28 LAB — HEPATITIS B SURFACE ANTIGEN: Hepatitis B Surface Ag: NEGATIVE

## 2011-12-28 MED ORDER — AZITHROMYCIN 1 G PO PACK: 1.0000 | PACK | Freq: Once | ORAL | Status: DC

## 2011-12-28 NOTE — Progress Notes (Signed)
Quick Note:  Telephone call: informed positive chlamydia. HIV, hepatitis B,C, RPR negative. Need to inform ex to be treated, abstain, RTO in 3 weeks for TOC. Zithromax 1 gm po to be called in. ______

## 2011-12-30 ENCOUNTER — Telehealth: Payer: Self-pay | Admitting: Gynecology

## 2011-12-30 MED ORDER — DOXYCYCLINE MONOHYDRATE 100 MG PO TABS: 100.0000 mg | ORAL_TABLET | Freq: Two times a day (BID) | ORAL | Status: AC

## 2011-12-30 NOTE — Telephone Encounter (Signed)
Patient calls that azithromycin is too expensive.  Will prescribe doxycycline 100 mg bid x 7.

## 2012-01-01 ENCOUNTER — Other Ambulatory Visit: Payer: Self-pay | Admitting: Women's Health

## 2012-01-18 ENCOUNTER — Ambulatory Visit (INDEPENDENT_AMBULATORY_CARE_PROVIDER_SITE_OTHER): Payer: BC Managed Care – PPO | Admitting: Women's Health

## 2012-01-18 ENCOUNTER — Encounter: Payer: Self-pay | Admitting: Women's Health

## 2012-01-18 DIAGNOSIS — Z113 Encounter for screening for infections with a predominantly sexual mode of transmission: Secondary | ICD-10-CM

## 2012-01-18 DIAGNOSIS — A64 Unspecified sexually transmitted disease: Secondary | ICD-10-CM | POA: Insufficient documentation

## 2012-01-18 DIAGNOSIS — N83209 Unspecified ovarian cyst, unspecified side: Secondary | ICD-10-CM

## 2012-01-18 HISTORY — DX: Unspecified sexually transmitted disease: A64

## 2012-01-18 NOTE — Progress Notes (Signed)
Presents for test of cure for Chlamydia. Diagnosed 12/27/11, treated with Azithromycin 1 gm. Denies urinary symptoms or vaginal discharge. Monthly cycles/7days/condoms. Partner was treated. Intercourse X 1 since diagnosis/different partner/condom. Hx  Left ovarian cyst, found CT scan 11/10/11. Hx lupus/plaquenil per rheumatologist.   Exam: External genitalia wnl. Speculum exam: Small amount of white discharge around cervix,GC/Chlaymydia cultures taken.  Test of cure for Chlamydia Hx Left ovarian cyst  Plan: GC/Chlamydia pending. Will call with results. Ultrasound to check resolution of left ovarian cyst, will call to schedule. Encouraged condoms for infection control and pregnancy prevention.

## 2012-01-19 LAB — GC/CHLAMYDIA PROBE AMP, GENITAL: GC Probe Amp, Genital: NEGATIVE

## 2012-01-22 ENCOUNTER — Other Ambulatory Visit: Payer: Self-pay | Admitting: Women's Health

## 2012-01-22 DIAGNOSIS — B999 Unspecified infectious disease: Secondary | ICD-10-CM

## 2012-01-22 MED ORDER — AZITHROMYCIN 500 MG PO TABS: 1000.0000 mg | ORAL_TABLET | Freq: Every day | ORAL | Status: AC

## 2012-02-08 ENCOUNTER — Encounter: Payer: Self-pay | Admitting: *Deleted

## 2012-02-08 NOTE — Progress Notes (Signed)
Patient ID: Hayley Webb, female   DOB: 02/18/1986, 26 y.o.   MRN: [id number] Positive Chlamydia reported on both 6/7 results and 7/1 results

## 2012-02-12 ENCOUNTER — Ambulatory Visit (INDEPENDENT_AMBULATORY_CARE_PROVIDER_SITE_OTHER): Payer: BC Managed Care – PPO | Admitting: Women's Health

## 2012-02-12 ENCOUNTER — Encounter: Payer: Self-pay | Admitting: Women's Health

## 2012-02-12 DIAGNOSIS — N949 Unspecified condition associated with female genital organs and menstrual cycle: Secondary | ICD-10-CM

## 2012-02-12 DIAGNOSIS — Z113 Encounter for screening for infections with a predominantly sexual mode of transmission: Secondary | ICD-10-CM

## 2012-02-12 DIAGNOSIS — N898 Other specified noninflammatory disorders of vagina: Secondary | ICD-10-CM

## 2012-02-12 NOTE — Progress Notes (Signed)
Patient ID: Hayley Webb, female   DOB: [birth year], 26 y.o.   MRN: [id number] Presents for a test of cure Chlamydia,  has abstained, finished medication, informed partner for treatment. Also states has had vaginal discharge with odor. Denies any abdominal pain or UTI symptoms.  Exam: External genitalia within normal limits, speculum exam cervix is pink healthy without discharge or lesion test of cure GC/Chlamydia culture taken and is pending. Wet prep was negative. Bimanual no CMT or adnexal fullness or tenderness.  Plan: Continue condoms, reviewed importance of consistent use for birth control and infection control. Reviewed normality of exam and wet prep.

## 2012-02-13 LAB — GC/CHLAMYDIA PROBE AMP, GENITAL
Chlamydia, DNA Probe: NEGATIVE
GC Probe Amp, Genital: NEGATIVE

## 2012-04-05 ENCOUNTER — Ambulatory Visit (INDEPENDENT_AMBULATORY_CARE_PROVIDER_SITE_OTHER): Payer: BC Managed Care – PPO | Admitting: Women's Health

## 2012-04-05 ENCOUNTER — Encounter: Payer: Self-pay | Admitting: Women's Health

## 2012-04-05 VITALS — BP 116/98 | Ht 69.0 in | Wt 327.0 lb

## 2012-04-05 DIAGNOSIS — Z01419 Encounter for gynecological examination (general) (routine) without abnormal findings: Secondary | ICD-10-CM

## 2012-04-05 DIAGNOSIS — N898 Other specified noninflammatory disorders of vagina: Secondary | ICD-10-CM

## 2012-04-05 LAB — WET PREP FOR TRICH, YEAST, CLUE
Trich, Wet Prep: NONE SEEN
WBC, Wet Prep HPF POC: NONE SEEN

## 2012-04-05 MED ORDER — METRONIDAZOLE 0.75 % VA GEL: VAGINAL | Status: AC

## 2012-04-05 NOTE — Progress Notes (Signed)
Hayley Webb 27-Jun-1986 [id number]    History:    The patient presents for annual exam.  Monthly cycle/condoms. New partner. History of normal Paps. Gardasil series completed 2012. History of lupus on plaquenil.  Past medical history, past surgical history, family history and social history were all reviewed and documented in the EPIC chart. Works as a Lawyer. History of positive GC 2010, Chlamydia 2012.   ROS:  A  ROS was performed and pertinent positives and negatives are included in the history.  Exam:  Filed Vitals:   04/05/12 0923  BP: 116/98    General appearance:  Obese  Head/Neck:  Normal, without cervical or supraclavicular adenopathy. Thyroid:  Symmetrical, normal in size, without palpable masses or nodularity. Respiratory  Effort:  Normal  Auscultation:  Clear without wheezing or rhonchi Cardiovascular  Auscultation:  Regular rate, without rubs, murmurs or gallops  Edema/varicosities:  Not grossly evident Abdominal  Soft,nontender, without masses, guarding or rebound.  Liver/spleen:  No organomegaly noted  Hernia:  None appreciated  Skin  Inspection:  Grossly normal  Palpation:  Grossly normal Neurologic/psychiatric  Orientation:  Normal with appropriate conversation.  Mood/affect:  Normal  Genitourinary    Breasts: Examined lying and sitting.     Right: Without masses, retractions, discharge or axillary adenopathy.     Left: Without masses, retractions, discharge or axillary adenopathy.   Inguinal/mons:  Normal without inguinal adenopathy  External genitalia:  Normal  BUS/Urethra/Skene's glands:  Normal  Bladder:  Normal  Vagina:  Wet prep positive for amines, clue cells and TNTC bacteria   Cervix:  Normal  Uterus:   normal in size, shape and contour.  Midline and mobile/ limited to 2 obesity  Adnexa/parametria:     Rt: Without masses or tenderness.   Lt: Without masses or tenderness.  Anus and perineum: Normal  Digital rectal exam: Normal sphincter  tone without palpated masses or tenderness  Assessment/Plan:  26 y.o. SBF G1 P0  for annual exam with complaint of a vaginal discharge.  BV STD screen Contraception management Lupus-primary care labs and meds Morbid obesity  Plan: MetroGel vaginal cream 1 applicator at bedtime x5, prescription, proper use reviewed, instructed  to call if no relief of symptoms. Contraception options reviewed, reviewed Mirena IUD, reviewed slight risk for infection, perforation, hemorrhage. Reviewed place with a.. Will check coverage, continue condoms, land the emergency contraception reviewed.  SBE's, exercise, importance of weight loss reviewed, Weight Watchers encouraged. MVI daily. No Pap, history of normal Paps, and new screening guidelines reviewed. GC/Chlamydia, declines need for HIV hepatitis or RPR.   [facility], 10:52 AM 04/05/2012

## 2012-04-05 NOTE — Patient Instructions (Signed)

## 2012-04-06 LAB — GC/CHLAMYDIA PROBE AMP, GENITAL
Chlamydia, DNA Probe: NEGATIVE
GC Probe Amp, Genital: NEGATIVE

## 2012-05-20 ENCOUNTER — Emergency Department (HOSPITAL_COMMUNITY): Admission: EM | Admit: 2012-05-20 | Payer: BC Managed Care – PPO | Source: Home / Self Care

## 2012-06-05 ENCOUNTER — Ambulatory Visit (INDEPENDENT_AMBULATORY_CARE_PROVIDER_SITE_OTHER): Payer: BC Managed Care – PPO | Admitting: Women's Health

## 2012-06-05 ENCOUNTER — Encounter: Payer: Self-pay | Admitting: Women's Health

## 2012-06-05 DIAGNOSIS — N898 Other specified noninflammatory disorders of vagina: Secondary | ICD-10-CM

## 2012-06-05 DIAGNOSIS — R350 Frequency of micturition: Secondary | ICD-10-CM

## 2012-06-05 LAB — URINALYSIS W MICROSCOPIC + REFLEX CULTURE
Casts: NONE SEEN
Ketones, ur: 15 mg/dL — AB
Leukocytes, UA: NEGATIVE
Nitrite: NEGATIVE
Urobilinogen, UA: 0.2 mg/dL (ref 0.0–1.0)
pH: 5.5 (ref 5.0–8.0)

## 2012-06-05 LAB — WET PREP FOR TRICH, YEAST, CLUE
Trich, Wet Prep: NONE SEEN
Yeast Wet Prep HPF POC: NONE SEEN

## 2012-06-05 MED ORDER — METRONIDAZOLE 0.75 % VA GEL: VAGINAL | Status: DC

## 2012-06-05 NOTE — Patient Instructions (Addendum)
Bacterial Vaginosis Bacterial vaginosis (BV) is a vaginal infection where the normal balance of bacteria in the vagina is disrupted. The normal balance is then replaced by an overgrowth of certain bacteria. There are several different kinds of bacteria that can cause BV. BV is the most common vaginal infection in women of childbearing age. CAUSES   The cause of BV is not fully understood. BV develops when there is an increase or imbalance of harmful bacteria.  Some activities or behaviors can upset the normal balance of bacteria in the vagina and put women at increased risk including:  Having a new sex partner or multiple sex partners.  Douching.  Using an intrauterine device (IUD) for contraception.  It is not clear what role sexual activity plays in the development of BV. However, women that have never had sexual intercourse are rarely infected with BV. Women do not get BV from toilet seats, bedding, swimming pools or from touching objects around them.  SYMPTOMS   Grey vaginal discharge.  A fish-like odor with discharge, especially after sexual intercourse.  Itching or burning of the vagina and vulva.  Burning or pain with urination.  Some women have no signs or symptoms at all. DIAGNOSIS  Your caregiver must examine the vagina for signs of BV. Your caregiver will perform lab tests and look at the sample of vaginal fluid through a microscope. They will look for bacteria and abnormal cells (clue cells), a pH test higher than 4.5, and a positive amine test all associated with BV.  RISKS AND COMPLICATIONS   Pelvic inflammatory disease (PID).  Infections following gynecology surgery.  Developing HIV.  Developing herpes virus. TREATMENT  Sometimes BV will clear up without treatment. However, all women with symptoms of BV should be treated to avoid complications, especially if gynecology surgery is planned. Female partners generally do not need to be treated. However, BV may spread  between female sex partners so treatment is helpful in preventing a recurrence of BV.   BV may be treated with antibiotics. The antibiotics come in either pill or vaginal cream forms. Either can be used with nonpregnant or pregnant women, but the recommended dosages differ. These antibiotics are not harmful to the baby.  BV can recur after treatment. If this happens, a second round of antibiotics will often be prescribed.  Treatment is important for pregnant women. If not treated, BV can cause a premature delivery, especially for a pregnant woman who had a premature birth in the past. All pregnant women who have symptoms of BV should be checked and treated.  For chronic reoccurrence of BV, treatment with a type of prescribed gel vaginally twice a week is helpful. HOME CARE INSTRUCTIONS   Finish all medication as directed by your caregiver.  Do not have sex until treatment is completed.  Tell your sexual partner that you have a vaginal infection. They should see their caregiver and be treated if they have problems, such as a mild rash or itching.  Practice safe sex. Use condoms. Only have 1 sex partner. PREVENTION  Basic prevention steps can help reduce the risk of upsetting the natural balance of bacteria in the vagina and developing BV:  Do not have sexual intercourse (be abstinent).  Do not douche.  Use all of the medicine prescribed for treatment of BV, even if the signs and symptoms go away.  Tell your sex partner if you have BV. That way, they can be treated, if needed, to prevent reoccurrence. SEEK MEDICAL CARE IF:     Your symptoms are not improving after 3 days of treatment.  You have increased discharge, pain, or fever. MAKE SURE YOU:   Understand these instructions.  Will watch your condition.  Will get help right away if you are not doing well or get worse. FOR MORE INFORMATION  Division of STD Prevention (DSTDP), Centers for Disease Control and Prevention:  www.cdc.gov/std American Social Health Association (ASHA): www.ashastd.org  Document Released: 07/10/2005 Document Revised: 10/02/2011 Document Reviewed: 12/31/2008 ExitCare Patient Information 2013 [employer].  

## 2012-06-05 NOTE — Progress Notes (Signed)
Patient ID: Hayley Webb, female   DOB: [birth year], 26 y.o.   MRN: [id number] Presents with the complaint of bilateral lower pelvic pain/cramping rates "8" today, states pain was worse last week when it started. States has minimal discharge with slight odor. Has had some recent problems with constipation. Reports increased  urinary frequency. Denies any pain or burning with urination, fever, same partner. Condoms for contraception. Cycle due next week. History of lupus.  Exam: Appears well. UA: Negative leukocytes, few bacteria. External genitalia within normal limits, speculum exam moderate amount of a white adherent discharge with odor noted. Wet prep positive for amines, clues, TNTC bacteria. Bimanual no CMT, slight tenderness in right lower quadrant, abdomen obese difficult to palpate.  BV Questionable ovulation pain versus ovarian cyst versus constipation  Plan: Samples of MiraLax given instructed instructed to increase fiber rich foods and diet. MetroGel vaginal cream 1 applicator at bedtime x5, alcohol precautions reviewed. Urine culture pending. Continue condoms for contraception. Reviewed if pelvic pain continues after cycle to return to office for ultrasound.

## 2012-06-07 LAB — URINE CULTURE: Colony Count: NO GROWTH

## 2012-06-19 ENCOUNTER — Encounter (HOSPITAL_COMMUNITY): Payer: Self-pay

## 2012-06-19 ENCOUNTER — Inpatient Hospital Stay (HOSPITAL_COMMUNITY)
Admission: AD | Admit: 2012-06-19 | Discharge: 2012-06-19 | Disposition: A | Discharge status: Home / Self Care | Payer: BC Managed Care – PPO | Source: Ambulatory Visit | Attending: Family Medicine | Admitting: Family Medicine

## 2012-06-19 DIAGNOSIS — B379 Candidiasis, unspecified: Secondary | ICD-10-CM

## 2012-06-19 DIAGNOSIS — L293 Anogenital pruritus, unspecified: Secondary | ICD-10-CM | POA: Insufficient documentation

## 2012-06-19 DIAGNOSIS — B373 Candidiasis of vulva and vagina: Secondary | ICD-10-CM | POA: Insufficient documentation

## 2012-06-19 DIAGNOSIS — B49 Unspecified mycosis: Secondary | ICD-10-CM

## 2012-06-19 DIAGNOSIS — B3731 Acute candidiasis of vulva and vagina: Secondary | ICD-10-CM | POA: Insufficient documentation

## 2012-06-19 HISTORY — DX: Essential (primary) hypertension: I10

## 2012-06-19 LAB — WET PREP, GENITAL: Trich, Wet Prep: NONE SEEN

## 2012-06-19 MED ORDER — FLUCONAZOLE 150 MG PO TABS: 150.0000 mg | ORAL_TABLET | Freq: Once | ORAL | Status: DC

## 2012-06-19 NOTE — MAU Note (Signed)
Pt states ex boyfriend's sister told her he put small hole in condom and has been sleeping with multiple people. Pt was unaware of this info until yesterday. Last intercourse was Sunday. Pt notes itching and burning, was recently dx'd with bv and given metrogel. Same s/s have returned. Denies abnormal vaginal discharge. Does note abnormal vaginal odor.

## 2012-06-19 NOTE — MAU Provider Note (Signed)
Chart reviewed and agree with management and plan.  

## 2012-06-19 NOTE — MAU Provider Note (Signed)
History     CSN: [id number]  Arrival date and time: 06/19/12 0740   First Provider Initiated Contact with Patient 06/19/12 [phone]      Chief Complaint  Patient presents with  . Exposure to STD   HPI Hayley Webb 26 y.o. Comes to MAU with vaginal itching.  Had a condom break last weekend.  Began having odor 2 days ago and itching internally and externally yesterday.  Is worried and came to be checked.  OB History    Grav Para Term Preterm Abortions TAB SAB Ect Mult Living   1    1  1          Past Medical History  Diagnosis Date  . 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 HSV infection   . Asthma   . Lupus   . Depression   . Hypertension     Past Surgical History  Procedure Date  . Tonsillectomy and adenoidectomy 1998  . Mouth surgery     2 teeth removed- 1 wisdom and 1 in front of it    Family History  Problem Relation Age of Onset  . Diabetes Maternal Aunt   . Cancer Maternal Grandmother 72    COLON CA  . Diabetes Maternal Grandmother   . Hypertension Maternal Grandfather   . Asthma Mother   . Asthma Brother   . Anesthesia problems Neg Hx   . Hypotension Neg Hx   . Malignant hyperthermia Neg Hx   . Pseudochol deficiency Neg Hx     History  Substance Use Topics  . Smoking status: Never Smoker   . Smokeless tobacco: Never Used  . Alcohol Use: No    Allergies:  Allergies  Allergen Reactions  . Bactrim Anaphylaxis and Hives  . Prednisone Swelling and Rash    Prescriptions prior to admission  Medication Sig Dispense Refill  . citalopram (CELEXA) 40 MG tablet Take 40 mg by mouth daily.        . cyclobenzaprine (FLEXERIL) 10 MG tablet Take 10 mg by mouth 3 (three) times daily as needed. For muscle spasms      . diphenhydramine-acetaminophen (TYLENOL PM) 25-500 MG TABS Take 1 tablet by mouth at bedtime as needed. For sleep      . Hydroxychloroquine Sulfate (PLAQUENIL PO) Take 10 mg by mouth daily.       . methylPREDNISolone  (MEDROL) 8 MG tablet Take 8 mg by mouth daily.      . metroNIDAZOLE (METROGEL VAGINAL) 0.75 % vaginal gel 1 applicator per vagina at HS x 5  70 g  0  . Prenatal Vit-Fe Fumarate-FA (PRENATAL MULTIVITAMIN) TABS Take 1 tablet by mouth daily.      Marland Kitchen albuterol (PROVENTIL HFA;VENTOLIN HFA) 108 (90 BASE) MCG/ACT inhaler Inhale 2 puffs into the lungs every 6 (six) hours as needed. For shortness of breath         Review of Systems  Constitutional: Negative for fever.  Gastrointestinal: Negative for nausea, vomiting, abdominal pain, diarrhea and constipation.  Genitourinary:       No vaginal discharge. No vaginal bleeding. No dysuria. Vaginal itching   Physical Exam   Blood pressure 146/90, pulse 91, temperature 97.8 F (36.6 C), temperature source Oral, resp. rate 20, height 5' 8.5" (1.74 m), weight 146.512 kg (323 lb), last menstrual period 05/17/2012, SpO2 100.00%.  Physical Exam  Nursing note and vitals reviewed. Constitutional: She is oriented to person, place, and time. She appears well-developed and well-nourished. No  distress.       obese  HENT:  Head: Normocephalic.  Eyes: EOM are normal.  Neck: Neck supple.  GI: Soft. Bowel sounds are normal. There is no tenderness.  Genitourinary:       Speculum exam: Vulva - negative Vagina - Small amount of creamy discharge, some adherent discharge, no odor Cervix - No contact bleeding Bimanual exam: Cervix closed Uterus - exam limited by habitus Adnexa - exam limited by habitus GC/Chlam, wet prep done Chaperone present for exam.  Musculoskeletal: Normal range of motion.  Neurological: She is alert and oriented to person, place, and time.  Skin: Skin is warm and dry.  Psychiatric: She has a normal mood and affect.    MAU Course  Procedures  MDM Results for orders placed during the hospital encounter of 06/19/12 (from the past 24 hour(s))  POCT PREGNANCY, URINE     Status: Normal   Collection Time   06/19/12  8:08 AM       Component Value Range   Preg Test, Ur NEGATIVE  NEGATIVE  WET PREP, GENITAL     Status: Abnormal   Collection Time   06/19/12  9:22 AM      Component Value Range   Yeast Wet Prep HPF POC NONE SEEN  NONE SEEN   Trich, Wet Prep NONE SEEN  NONE SEEN   Clue Cells Wet Prep HPF POC FEW (*) NONE SEEN   WBC, Wet Prep HPF POC FEW (*) NONE SEEN    Assessment and Plan  Vaginal itching - clinical yeast  Plan Cultures pending Rx diflucan 150 mg PO single dose Tiombe Tomeo 06/19/2012, 9:39 AM

## 2012-06-19 NOTE — MAU Note (Signed)
Patient states she might have been exposed to STI's. Has been having vaginal itching and odor.

## 2012-06-21 LAB — GC/CHLAMYDIA PROBE AMP
CT Probe RNA: NEGATIVE
GC Probe RNA: NEGATIVE

## 2012-07-12 ENCOUNTER — Other Ambulatory Visit: Payer: BC Managed Care – PPO

## 2012-07-12 ENCOUNTER — Ambulatory Visit: Payer: BC Managed Care – PPO | Admitting: Women's Health

## 2012-07-21 ENCOUNTER — Emergency Department (HOSPITAL_BASED_OUTPATIENT_CLINIC_OR_DEPARTMENT_OTHER)
Admission: EM | Admit: 2012-07-21 | Discharge: 2012-07-21 | Disposition: A | Discharge status: Home / Self Care | Payer: BC Managed Care – PPO | Attending: Emergency Medicine | Admitting: Emergency Medicine

## 2012-07-21 ENCOUNTER — Emergency Department (HOSPITAL_BASED_OUTPATIENT_CLINIC_OR_DEPARTMENT_OTHER): Payer: BC Managed Care – PPO

## 2012-07-21 ENCOUNTER — Encounter (HOSPITAL_BASED_OUTPATIENT_CLINIC_OR_DEPARTMENT_OTHER): Payer: Self-pay

## 2012-07-21 DIAGNOSIS — J189 Pneumonia, unspecified organism: Secondary | ICD-10-CM

## 2012-07-21 DIAGNOSIS — Z79899 Other long term (current) drug therapy: Secondary | ICD-10-CM | POA: Insufficient documentation

## 2012-07-21 DIAGNOSIS — F3289 Other specified depressive episodes: Secondary | ICD-10-CM | POA: Insufficient documentation

## 2012-07-21 DIAGNOSIS — J45901 Unspecified asthma with (acute) exacerbation: Secondary | ICD-10-CM | POA: Insufficient documentation

## 2012-07-21 DIAGNOSIS — R1110 Vomiting, unspecified: Secondary | ICD-10-CM | POA: Insufficient documentation

## 2012-07-21 DIAGNOSIS — F329 Major depressive disorder, single episode, unspecified: Secondary | ICD-10-CM | POA: Insufficient documentation

## 2012-07-21 DIAGNOSIS — Z8619 Personal history of other infectious and parasitic diseases: Secondary | ICD-10-CM | POA: Insufficient documentation

## 2012-07-21 DIAGNOSIS — A64 Unspecified sexually transmitted disease: Secondary | ICD-10-CM | POA: Insufficient documentation

## 2012-07-21 DIAGNOSIS — I10 Essential (primary) hypertension: Secondary | ICD-10-CM | POA: Insufficient documentation

## 2012-07-21 DIAGNOSIS — B009 Herpesviral infection, unspecified: Secondary | ICD-10-CM | POA: Insufficient documentation

## 2012-07-21 LAB — URINALYSIS, ROUTINE W REFLEX MICROSCOPIC
Glucose, UA: NEGATIVE mg/dL
Ketones, ur: NEGATIVE mg/dL
Leukocytes, UA: NEGATIVE
Nitrite: NEGATIVE
Protein, ur: NEGATIVE mg/dL
pH: 7 (ref 5.0–8.0)

## 2012-07-21 LAB — PREGNANCY, URINE: Preg Test, Ur: NEGATIVE

## 2012-07-21 MED ORDER — AMOXICILLIN-POT CLAVULANATE 875-125 MG PO TABS: 1.0000 | ORAL_TABLET | Freq: Two times a day (BID) | ORAL | Status: DC

## 2012-07-21 MED ORDER — GUAIFENESIN ER 600 MG PO TB12: 1200.0000 mg | ORAL_TABLET | Freq: Two times a day (BID) | ORAL | Status: DC

## 2012-07-21 NOTE — ED Provider Notes (Addendum)
History     CSN: [id number]  Arrival date & time 07/21/12  0138   First MD Initiated Contact with Patient 07/21/12 0255      Chief Complaint  Patient presents with  . chest fullness-vomiting-hx of asthma     (Consider location/radiation/quality/duration/timing/severity/associated sxs/prior treatment) Patient is a 26 y.o. female presenting with wheezing. The history is provided by the patient. No language interpreter was used.  Wheezing  The current episode started yesterday. The onset was gradual. The problem occurs continuously. The problem has been resolved. The problem is moderate. Nothing relieves the symptoms. Nothing aggravates the symptoms. Associated symptoms include cough and wheezing. Pertinent negatives include no chest pressure, no fever and no shortness of breath. There was no intake of a foreign body. She has not inhaled smoke recently. Her past medical history is significant for asthma. She has been behaving normally. Urine output has been normal. The last void occurred less than 6 hours ago. She has received no recent medical care.  Has been congested in both nose and chest since yesterday with a "rattle" in her chest.  Had an episode of vomiting 2 days ago.  No f/c/r.  Eating and drinking normally.  No OCP no swelling of the lower extremities  Past Medical History  Diagnosis Date  . 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 HSV infection   . Asthma   . Lupus   . Depression   . Hypertension     Past Surgical History  Procedure Date  . Tonsillectomy and adenoidectomy 1998  . Mouth surgery     2 teeth removed- 1 wisdom and 1 in front of it    Family History  Problem Relation Age of Onset  . Diabetes Maternal Aunt   . Cancer Maternal Grandmother 72    COLON CA  . Diabetes Maternal Grandmother   . Hypertension Maternal Grandfather   . Asthma Mother   . Asthma Brother   . Anesthesia problems Neg Hx   . Hypotension Neg Hx   . Malignant  hyperthermia Neg Hx   . Pseudochol deficiency Neg Hx     History  Substance Use Topics  . Smoking status: Never Smoker   . Smokeless tobacco: Never Used  . Alcohol Use: No    OB History    Grav Para Term Preterm Abortions TAB SAB Ect Mult Living   1    1  1          Review of Systems  Constitutional: Negative for fever.  Respiratory: Positive for cough and wheezing. Negative for shortness of breath.   All other systems reviewed and are negative.    Allergies  Bactrim and Prednisone  Home Medications   Current Outpatient Rx  Name  Route  Sig  Dispense  Refill  . ALBUTEROL SULFATE HFA 108 (90 BASE) MCG/ACT IN AERS   Inhalation   Inhale 2 puffs into the lungs every 6 (six) hours as needed. For shortness of breath          . CITALOPRAM HYDROBROMIDE 40 MG PO TABS   Oral   Take 40 mg by mouth daily.           Marland Kitchen PLAQUENIL PO   Oral   Take 10 mg by mouth daily.          . METHYLPREDNISOLONE 8 MG PO TABS   Oral   Take 8 mg by mouth daily.         Marland Kitchen  PRENATAL MULTIVITAMIN CH   Oral   Take 1 tablet by mouth daily.           BP 147/101  Pulse 93  Temp 98 F (36.7 C) (Oral)  Resp 18  Wt 315 lb (142.883 kg)  SpO2 100%  LMP 06/22/2012  Physical Exam  Constitutional: She is oriented to person, place, and time. She appears well-developed and well-nourished. No distress.  HENT:  Head: Normocephalic and atraumatic.  Mouth/Throat: Oropharynx is clear and moist.  Eyes: Conjunctivae normal are normal. Pupils are equal, round, and reactive to light.  Neck: Normal range of motion. Neck supple.  Cardiovascular: Normal rate, regular rhythm and intact distal pulses.   Pulmonary/Chest: Effort normal and breath sounds normal. No stridor. She has no wheezes. She has no rales.  Abdominal: Soft. Bowel sounds are normal. There is no tenderness. There is no rebound and no guarding.  Musculoskeletal: Normal range of motion.  Lymphadenopathy:    She has no cervical  adenopathy.  Neurological: She is alert and oriented to person, place, and time.  Skin: Skin is warm and dry.  Psychiatric: She has a normal mood and affect.    ED Course  Procedures (including critical care time)  Labs Reviewed  URINALYSIS, ROUTINE W REFLEX MICROSCOPIC - Abnormal; Notable for the following:    Specific Gravity, Urine 1.040 (*)     All other components within normal limits  PREGNANCY, URINE   Dg Chest 2 View  07/21/2012  *RADIOLOGY REPORT*  Clinical Data: Generalized body aches and cough.  History of asthma.  CHEST - 2 VIEW  Comparison: Chest radiograph performed 10/10/2011  Findings: The lungs are well-aerated.  Minimal right upper lung zone opacity could reflect mild pneumonia.  There is no evidence of pleural effusion or pneumothorax.  The heart is normal in size; the mediastinal contour is within normal limits.  No acute osseous abnormalities are seen.  IMPRESSION: Minimal right upper lung zone opacity could reflect mild pneumonia.   Original Report Authenticated By: Tonia Ghent, M.D.      No diagnosis found.    MDM  Perc negative Wells 0  COugh and congestion started yesterday, will treat for community acquired pneumonia.  Return for wheezing chest pain shortness of breath or any concerns.  Follow up Monday with your regular doctor for a recheck        Illa Enlow K Duane Earnshaw-Rasch, MD 07/21/12 0348  Jazsmine Macari K Jahzion Brogden-Rasch, MD 07/21/12 [phone]

## 2012-07-21 NOTE — ED Notes (Signed)
Patent reports that she developed chest tightness this past Wednesday, dry cough, reports vomiting x 1 day. No other associated symptoms.

## 2012-08-08 ENCOUNTER — Encounter: Payer: Self-pay | Admitting: Women's Health

## 2012-08-08 ENCOUNTER — Ambulatory Visit (INDEPENDENT_AMBULATORY_CARE_PROVIDER_SITE_OTHER): Payer: BC Managed Care – PPO | Admitting: Women's Health

## 2012-08-08 DIAGNOSIS — N898 Other specified noninflammatory disorders of vagina: Secondary | ICD-10-CM

## 2012-08-08 LAB — WET PREP FOR TRICH, YEAST, CLUE

## 2012-08-08 MED ORDER — METRONIDAZOLE 500 MG PO TABS: ORAL_TABLET | ORAL | Status: DC

## 2012-08-08 NOTE — Patient Instructions (Addendum)
Trichomoniasis Trichomoniasis is an infection, caused by the Trichomonas organism, that affects both women and men. In women, the outer female genitalia and the vagina are affected. In men, the penis is mainly affected, but the prostate and other reproductive organs can also be involved. Trichomoniasis is a sexually transmitted disease (STD) and is most often passed to another person through sexual contact. The majority of people who get trichomoniasis do so from a sexual encounter and are also at risk for other STDs. CAUSES   Sexual intercourse with an infected partner.  It can be present in swimming pools or hot tubs. SYMPTOMS   Abnormal gray-green frothy vaginal discharge in women.  Vaginal itching and irritation in women.  Itching and irritation of the area outside the vagina in women.  Penile discharge with or without pain in males.  Inflammation of the urethra (urethritis), causing painful urination.  Bleeding after sexual intercourse. RELATED COMPLICATIONS  Pelvic inflammatory disease.  Infection of the uterus (endometritis).  Infertility.  Tubal (ectopic) pregnancy.  It can be associated with other STDs, including gonorrhea and chlamydia, hepatitis B, and HIV. COMPLICATIONS DURING PREGNANCY  Early (premature) delivery.  Premature rupture of the membranes (PROM).  Low birth weight. DIAGNOSIS   Visualization of Trichomonas under the microscope from the vagina discharge.  Ph of the vagina greater than 4.5, tested with a test tape.  Trich Rapid Test.  Culture of the organism, but this is not usually needed.  It may be found on a Pap test.  Having a "strawberry cervix,"which means the cervix looks very red like a strawberry. TREATMENT   You may be given medication to fight the infection. Inform your caregiver if you could be or are pregnant. Some medications used to treat the infection should not be taken during pregnancy.  Over-the-counter medications or  creams to decrease itching or irritation may be recommended.  Your sexual partner will need to be treated if infected. HOME CARE INSTRUCTIONS   Take all medication prescribed by your caregiver.  Take over-the-counter medication for itching or irritation as directed by your caregiver.  Do not have sexual intercourse while you have the infection.  Do not douche or wear tampons.  Discuss your infection with your partner, as your partner may have acquired the infection from you. Or, your partner may have been the person who transmitted the infection to you.  Have your sex partner examined and treated if necessary.  Practice safe, informed, and protected sex.  See your caregiver for other STD testing. SEEK MEDICAL CARE IF:   You still have symptoms after you finish the medication.  You have an oral temperature above 102 F (38.9 C).  You develop belly (abdominal) pain.  You have pain when you urinate.  You have bleeding after sexual intercourse.  You develop a rash.  The medication makes you sick or makes you throw up (vomit). Document Released: 01/03/2001 Document Revised: 10/02/2011 Document Reviewed: 01/29/2009 Exit[employer] Patient Information 2013 [employer].  

## 2012-08-08 NOTE — Progress Notes (Signed)
Patient ID: LENIA HOUSLEY, female   DOB: 1985/11/30, 27 y.o.   MRN: [id number] Presents with the complaint of a vaginal discharge with slight odor. Same partner negative cultures. Contraceptives with condoms. Denies urinary symptoms, fever or abdominal pain.  Exam: External genitalia erythematous at introitus, speculum exam scant amount of a white discharge, wet prep positive for trichomonas. Bimanual no CMT or adnexal tenderness.  Trichomonas  Plan: Flagyl 2 g by mouth for patient and partner. Instructed to abstain, alcohol precautions reviewed, instructed to call if no relief of discharge.

## 2012-09-02 ENCOUNTER — Encounter (HOSPITAL_COMMUNITY): Payer: Self-pay | Admitting: *Deleted

## 2012-09-02 ENCOUNTER — Emergency Department (HOSPITAL_COMMUNITY)
Admission: EM | Admit: 2012-09-02 | Discharge: 2012-09-02 | Disposition: A | Discharge status: Home / Self Care | Payer: BC Managed Care – PPO | Attending: Emergency Medicine | Admitting: Emergency Medicine

## 2012-09-02 ENCOUNTER — Emergency Department (HOSPITAL_COMMUNITY): Payer: BC Managed Care – PPO

## 2012-09-02 DIAGNOSIS — R059 Cough, unspecified: Secondary | ICD-10-CM | POA: Insufficient documentation

## 2012-09-02 DIAGNOSIS — J45909 Unspecified asthma, uncomplicated: Secondary | ICD-10-CM | POA: Insufficient documentation

## 2012-09-02 DIAGNOSIS — M329 Systemic lupus erythematosus, unspecified: Secondary | ICD-10-CM | POA: Insufficient documentation

## 2012-09-02 DIAGNOSIS — R0789 Other chest pain: Secondary | ICD-10-CM

## 2012-09-02 DIAGNOSIS — R05 Cough: Secondary | ICD-10-CM | POA: Insufficient documentation

## 2012-09-02 DIAGNOSIS — Z8659 Personal history of other mental and behavioral disorders: Secondary | ICD-10-CM | POA: Insufficient documentation

## 2012-09-02 DIAGNOSIS — I10 Essential (primary) hypertension: Secondary | ICD-10-CM | POA: Insufficient documentation

## 2012-09-02 DIAGNOSIS — Z79899 Other long term (current) drug therapy: Secondary | ICD-10-CM | POA: Insufficient documentation

## 2012-09-02 DIAGNOSIS — R071 Chest pain on breathing: Secondary | ICD-10-CM | POA: Insufficient documentation

## 2012-09-02 DIAGNOSIS — Z8619 Personal history of other infectious and parasitic diseases: Secondary | ICD-10-CM | POA: Insufficient documentation

## 2012-09-02 MED ORDER — IBUPROFEN 200 MG PO TABS
600.0000 mg | ORAL_TABLET | Freq: Once | ORAL | Status: AC
  Administered 2012-09-02: 600 mg via ORAL
  Filled 2012-09-02: qty 3

## 2012-09-02 NOTE — ED Notes (Signed)
Pt states is having chest pain with productive cough x 2 days; states pain is all the time; no fever  ;

## 2012-09-02 NOTE — ED Notes (Signed)
Pt states she has been having sternal chest pain w/ productive cough x 2d. States that the pain is intermittent and gets worse when she takes a deep breath or coughes. Pt also states that sitting up helps reduce the pain. Pt has been taking Mucinex for the congestion.

## 2012-09-05 NOTE — ED Provider Notes (Signed)
History    26yf with CP. Intermittent for past 2d. No pain at rest. Pain in L/center of chest when coughs, laughs or breaths deeply. Feels best when still and sitting up. Denies trauma. Productive cough. No sob. No fever or chills. No unusual leg pain or swelling.   CSN: [id number]  Arrival date & time 09/02/12  0324   First MD Initiated Contact with Patient 09/02/12 [phone]      Chief Complaint  Patient presents with  . Cough  . chest wall pain     (Consider location/radiation/quality/duration/timing/severity/associated sxs/prior treatment) HPI  Past Medical History  Diagnosis Date  . 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 HSV infection   . Asthma   . Lupus   . Depression   . Hypertension     Past Surgical History  Procedure Laterality Date  . Tonsillectomy and adenoidectomy  1998  . Mouth surgery      2 teeth removed- 1 wisdom and 1 in front of it    Family History  Problem Relation Age of Onset  . Diabetes Maternal Aunt   . Cancer Maternal Grandmother 72    COLON CA  . Diabetes Maternal Grandmother   . Hypertension Maternal Grandfather   . Asthma Mother   . Asthma Brother   . Anesthesia problems Neg Hx   . Hypotension Neg Hx   . Malignant hyperthermia Neg Hx   . Pseudochol deficiency Neg Hx     History  Substance Use Topics  . Smoking status: Never Smoker   . Smokeless tobacco: Never Used  . Alcohol Use: No    OB History   Grav Para Term Preterm Abortions TAB SAB Ect Mult Living   1    1  1          Review of Systems  All systems reviewed and negative, other than as noted in HPI.   Allergies  Bactrim and Prednisone  Home Medications   Current Outpatient Rx  Name  Route  Sig  Dispense  Refill  . albuterol (PROVENTIL HFA;VENTOLIN HFA) 108 (90 BASE) MCG/ACT inhaler   Inhalation   Inhale 2 puffs into the lungs every 6 (six) hours as needed. For shortness of breath          . hydroxychloroquine (PLAQUENIL) 200 MG  tablet   Oral   Take 200 mg by mouth daily.         . methylPREDNISolone (MEDROL) 8 MG tablet   Oral   Take 8 mg by mouth daily.         . Prenatal Vit-Fe Fumarate-FA (PRENATAL MULTIVITAMIN) TABS   Oral   Take 1 tablet by mouth daily.           BP 127/79  Pulse 71  Temp(Src) 97.8 F (36.6 C) (Oral)  Resp 18  Ht 5\' 9"  (1.753 m)  Wt 280 lb (127.007 kg)  BMI 41.33 kg/m2  SpO2 100%  LMP 08/19/2012  Physical Exam  Nursing note and vitals reviewed. Constitutional: She appears well-developed and well-nourished. No distress.  HENT:  Head: Normocephalic and atraumatic.  Eyes: Conjunctivae are normal. Right eye exhibits no discharge. Left eye exhibits no discharge.  Neck: Neck supple.  Cardiovascular: Normal rate, regular rhythm and normal heart sounds.  Exam reveals no gallop and no friction rub.   No murmur heard. Pulmonary/Chest: Effort normal and breath sounds normal. No respiratory distress. She exhibits tenderness.  Tenderness to palpation along L mid/upper parasternal border  Abdominal: Soft. She exhibits no distension. There is no tenderness.  Musculoskeletal: She exhibits no edema and no tenderness.  Lower extremities symmetric as compared to each other. No calf tenderness. Negative Homan's. No palpable cords.   Neurological: She is alert.  Skin: Skin is warm and dry.  Psychiatric: She has a normal mood and affect. Her behavior is normal. Thought content normal.    ED Course  Procedures (including critical care time)  Labs Reviewed - No data to display No results found.  Dg Chest 2 View  09/02/2012  *RADIOLOGY REPORT*  Clinical Data: Chest pain for 24 hours.  CHEST - 2 VIEW  Comparison: Chest radiograph performed 07/21/2012  Findings: The lungs are well-aerated and clear.  There is no evidence of focal opacification, pleural effusion or pneumothorax.  The heart is normal in size; the mediastinal contour is within normal limits.  No acute osseous abnormalities  are seen.  IMPRESSION: No acute cardiopulmonary process seen.   Original Report Authenticated By: Tonia Ghent, M.D.     EKG:  Rhythm: normal sinus Rate: 82 Axis: normal Intervals: normal ST segments: NS ST changes   1. Chest wall pain       MDM  26yf with CP. Very likely chest wall pain with such reproducibility with palpation. No concerning skin lesions noted. CXR clear. EKG reassuring. Low suspicion for PE. Plan symptomatic tx. Emergent return precautions discussed.         Raeford Razor, MD 09/05/12 [phone]

## 2012-09-20 ENCOUNTER — Encounter: Payer: Self-pay | Admitting: Gynecology

## 2012-09-20 ENCOUNTER — Ambulatory Visit (INDEPENDENT_AMBULATORY_CARE_PROVIDER_SITE_OTHER): Payer: BC Managed Care – PPO | Admitting: Gynecology

## 2012-09-20 DIAGNOSIS — Z113 Encounter for screening for infections with a predominantly sexual mode of transmission: Secondary | ICD-10-CM

## 2012-09-20 DIAGNOSIS — IMO0001 Reserved for inherently not codable concepts without codable children: Secondary | ICD-10-CM

## 2012-09-20 DIAGNOSIS — N390 Urinary tract infection, site not specified: Secondary | ICD-10-CM

## 2012-09-20 DIAGNOSIS — N912 Amenorrhea, unspecified: Secondary | ICD-10-CM

## 2012-09-20 DIAGNOSIS — R300 Dysuria: Secondary | ICD-10-CM

## 2012-09-20 DIAGNOSIS — N760 Acute vaginitis: Secondary | ICD-10-CM

## 2012-09-20 DIAGNOSIS — R102 Pelvic and perineal pain: Secondary | ICD-10-CM

## 2012-09-20 DIAGNOSIS — N898 Other specified noninflammatory disorders of vagina: Secondary | ICD-10-CM

## 2012-09-20 DIAGNOSIS — R350 Frequency of micturition: Secondary | ICD-10-CM

## 2012-09-20 DIAGNOSIS — A499 Bacterial infection, unspecified: Secondary | ICD-10-CM

## 2012-09-20 DIAGNOSIS — N949 Unspecified condition associated with female genital organs and menstrual cycle: Secondary | ICD-10-CM | POA: Insufficient documentation

## 2012-09-20 LAB — URINALYSIS W MICROSCOPIC + REFLEX CULTURE
Bilirubin Urine: NEGATIVE
Nitrite: NEGATIVE
Protein, ur: NEGATIVE mg/dL
Specific Gravity, Urine: >1.03 — ABNORMAL HIGH (ref 1.005–1.030)
Urobilinogen, UA: 0.2 mg/dL (ref 0.0–1.0)

## 2012-09-20 LAB — PREGNANCY, URINE: Preg Test, Ur: NEGATIVE

## 2012-09-20 LAB — WET PREP FOR TRICH, YEAST, CLUE: Trich, Wet Prep: NONE SEEN

## 2012-09-20 MED ORDER — NITROFURANTOIN MONOHYD MACRO 100 MG PO CAPS: 100.0000 mg | ORAL_CAPSULE | Freq: Two times a day (BID) | ORAL | Status: DC

## 2012-09-20 MED ORDER — METRONIDAZOLE 500 MG PO TABS: 500.0000 mg | ORAL_TABLET | Freq: Two times a day (BID) | ORAL | Status: DC

## 2012-09-20 MED ORDER — METRONIDAZOLE 0.75 % VA GEL: 1.0000 | Freq: Two times a day (BID) | VAGINAL | Status: DC

## 2012-09-20 NOTE — Progress Notes (Signed)
The patient is a 27 year old who presented to the office complaining of the past few days of dysuria and frequency. She was also having vaginal discharge since last week and tried over-the-counter antifungal agent for 2 days. She has had the same sexual partner for 3 months. She states she has been using condoms at times they do not use condoms. Her last menstrual period was January 27. She had some nausea but denied any fever chills or vomiting. Patient denied any back pain. She states that for several months she's been having left lower quadrant pains worse and more frequent in the past few weeks.  Exam: Back: No CVA tenderness Abdomen: Soft nontender no rebound or guarding Pelvic: Bartholin urethra Skene glands within normal limits Vagina: White fishy odor discharge was noted Cervix: No lesions or discharge Bimanual exam: Uterus upper limits of normal no palpable masses or tenderness adnexa no palpable masses or tenderness although limited due to patient's abdominal girth and wait  Urinalysis: Many bacteria, 3-6 WBC, 0-2 RBC  Urine pregnancy test negative  Wet prep: Positive amine, moderate clue cell, too numerous to count bacteria  Assessment/plan: Problem #1 bacterial vaginosis patient will be placed on MetroGel vaginal cream to apply twice a day for 5 days. Problem #2 urinary tract infection patient will be placed on Macrobid one by mouth twice a day for 7 days. She was given samples of Uribell to take 1 by mouth 4 times a day for 2 days for bladder spasm and dysuria. She was encouraged to increase her fluid intake. Problem #3 chronic left lower quadrant pain worsening over the past few weeks. Limited pelvic exam due to patient's abdominal girth. Patient will return to the office next week for an ultrasound for better assessment of her uterus and ovaries. Problem #4 patient today's late on her menses soon to start urine pregnancy test negative. Problem #5: STD screen pending at time of  this dictation

## 2012-09-20 NOTE — Patient Instructions (Addendum)
Bacterial Vaginosis Bacterial vaginosis (BV) is a vaginal infection where the normal balance of bacteria in the vagina is disrupted. The normal balance is then replaced by an overgrowth of certain bacteria. There are several different kinds of bacteria that can cause BV. BV is the most common vaginal infection in women of childbearing age. CAUSES   The cause of BV is not fully understood. BV develops when there is an increase or imbalance of harmful bacteria.  Some activities or behaviors can upset the normal balance of bacteria in the vagina and put women at increased risk including:  Having a new sex partner or multiple sex partners.  Douching.  Using an intrauterine device (IUD) for contraception.  It is not clear what role sexual activity plays in the development of BV. However, women that have never had sexual intercourse are rarely infected with BV. Women do not get BV from toilet seats, bedding, swimming pools or from touching objects around them.  SYMPTOMS   Grey vaginal discharge.  A fish-like odor with discharge, especially after sexual intercourse.  Itching or burning of the vagina and vulva.  Burning or pain with urination.  Some women have no signs or symptoms at all. DIAGNOSIS  Your caregiver must examine the vagina for signs of BV. Your caregiver will perform lab tests and look at the sample of vaginal fluid through a microscope. They will look for bacteria and abnormal cells (clue cells), a pH test higher than 4.5, and a positive amine test all associated with BV.  RISKS AND COMPLICATIONS   Pelvic inflammatory disease (PID).  Infections following gynecology surgery.  Developing HIV.  Developing herpes virus. TREATMENT  Sometimes BV will clear up without treatment. However, all women with symptoms of BV should be treated to avoid complications, especially if gynecology surgery is planned. Female partners generally do not need to be treated. However, BV may spread  between female sex partners so treatment is helpful in preventing a recurrence of BV.   BV may be treated with antibiotics. The antibiotics come in either pill or vaginal cream forms. Either can be used with nonpregnant or pregnant women, but the recommended dosages differ. These antibiotics are not harmful to the baby.  BV can recur after treatment. If this happens, a second round of antibiotics will often be prescribed.  Treatment is important for pregnant women. If not treated, BV can cause a premature delivery, especially for a pregnant woman who had a premature birth in the past. All pregnant women who have symptoms of BV should be checked and treated.  For chronic reoccurrence of BV, treatment with a type of prescribed gel vaginally twice a week is helpful. HOME CARE INSTRUCTIONS   Finish all medication as directed by your caregiver.  Do not have sex until treatment is completed.  Tell your sexual partner that you have a vaginal infection. They should see their caregiver and be treated if they have problems, such as a mild rash or itching.  Practice safe sex. Use condoms. Only have 1 sex partner. PREVENTION  Basic prevention steps can help reduce the risk of upsetting the natural balance of bacteria in the vagina and developing BV:  Do not have sexual intercourse (be abstinent).  Do not douche.  Use all of the medicine prescribed for treatment of BV, even if the signs and symptoms go away.  Tell your sex partner if you have BV. That way, they can be treated, if needed, to prevent reoccurrence. SEEK MEDICAL CARE IF:     prescribed for treatment of BV, even if the signs and symptoms go away.   Tell your sex partner if you have BV. That way, they can be treated, if needed, to prevent reoccurrence.  SEEK MEDICAL CARE IF:    Your symptoms are not improving after 3 days of treatment.   You have increased discharge, pain, or fever.  MAKE SURE YOU:    Understand these instructions.   Will watch your condition.   Will get help right away if you are not doing well or get worse.  FOR MORE INFORMATION   Division of STD Prevention (DSTDP), Centers for Disease Control and Prevention:  www.cdc.gov/std  American Social Health Association (ASHA): www.ashastd.org   Document Released: 07/10/2005 Document Revised: 10/02/2011 Document Reviewed: 12/31/2008  ExitCare Patient Information 2013 [employer].  Urinary Tract Infection  Urinary tract infections (UTIs) can develop anywhere along your urinary tract. Your urinary tract is your body's drainage system for removing wastes and extra water. Your urinary tract includes two kidneys, two ureters, a bladder, and a urethra. Your kidneys are a pair of bean-shaped organs. Each kidney is about the size of your fist. They are located below your ribs, one on each side of your spine.  CAUSES  Infections are caused by microbes, which are microscopic organisms, including fungi, viruses, and bacteria. These organisms are so small that they can only be seen through a microscope. Bacteria are the microbes that most commonly cause UTIs.  SYMPTOMS   Symptoms of UTIs may vary by age and gender of the patient and by the location of the infection. Symptoms in young women typically include a frequent and intense urge to urinate and a painful, burning feeling in the bladder or urethra during urination. Older women and men are more likely to be tired, shaky, and weak and have muscle aches and abdominal pain. A fever may mean the infection is in your kidneys. Other symptoms of a kidney infection include pain in your back or sides below the ribs, nausea, and vomiting.  DIAGNOSIS  To diagnose a UTI, your caregiver will ask you about your symptoms. Your caregiver also will ask to provide a urine sample. The urine sample will be tested for bacteria and white blood cells. White blood cells are made by your body to help fight infection.  TREATMENT   Typically, UTIs can be treated with medication. Because most UTIs are caused by a bacterial infection, they usually can be treated with the use of antibiotics. The choice of antibiotic and length of treatment depend on your symptoms  and the type of bacteria causing your infection.  HOME CARE INSTRUCTIONS   If you were prescribed antibiotics, take them exactly as your caregiver instructs you. Finish the medication even if you feel better after you have only taken some of the medication.   Drink enough water and fluids to keep your urine clear or pale yellow.   Avoid caffeine, tea, and carbonated beverages. They tend to irritate your bladder.   Empty your bladder often. Avoid holding urine for long periods of time.   Empty your bladder before and after sexual intercourse.   After a bowel movement, women should cleanse from front to back. Use each tissue only once.  SEEK MEDICAL CARE IF:    You have back pain.   You develop a fever.   Your symptoms do not begin to resolve within 3 days.  SEEK IMMEDIATE MEDICAL CARE IF:    You have severe back pain or lower

## 2012-09-21 LAB — GC/CHLAMYDIA PROBE AMP
CT Probe RNA: NEGATIVE
GC Probe RNA: NEGATIVE

## 2012-09-21 LAB — URINE CULTURE: Colony Count: 7000

## 2012-10-02 ENCOUNTER — Ambulatory Visit (INDEPENDENT_AMBULATORY_CARE_PROVIDER_SITE_OTHER): Payer: BC Managed Care – PPO | Admitting: Gynecology

## 2012-10-02 ENCOUNTER — Other Ambulatory Visit: Payer: Self-pay | Admitting: Gynecology

## 2012-10-02 ENCOUNTER — Ambulatory Visit (INDEPENDENT_AMBULATORY_CARE_PROVIDER_SITE_OTHER): Payer: BC Managed Care – PPO

## 2012-10-02 DIAGNOSIS — R635 Abnormal weight gain: Secondary | ICD-10-CM | POA: Insufficient documentation

## 2012-10-02 DIAGNOSIS — R102 Pelvic and perineal pain: Secondary | ICD-10-CM

## 2012-10-02 DIAGNOSIS — N979 Female infertility, unspecified: Secondary | ICD-10-CM

## 2012-10-02 DIAGNOSIS — L680 Hirsutism: Secondary | ICD-10-CM

## 2012-10-02 DIAGNOSIS — R198 Other specified symptoms and signs involving the digestive system and abdomen: Secondary | ICD-10-CM

## 2012-10-02 DIAGNOSIS — R1900 Intra-abdominal and pelvic swelling, mass and lump, unspecified site: Secondary | ICD-10-CM

## 2012-10-02 DIAGNOSIS — N949 Unspecified condition associated with female genital organs and menstrual cycle: Secondary | ICD-10-CM

## 2012-10-02 DIAGNOSIS — N920 Excessive and frequent menstruation with regular cycle: Secondary | ICD-10-CM

## 2012-10-02 DIAGNOSIS — N8300 Follicular cyst of ovary, unspecified side: Secondary | ICD-10-CM

## 2012-10-02 HISTORY — DX: Hirsutism: L68.0

## 2012-10-02 MED ORDER — DOXYCYCLINE HYCLATE 100 MG PO CAPS: 100.0000 mg | ORAL_CAPSULE | Freq: Two times a day (BID) | ORAL | Status: DC

## 2012-10-02 NOTE — Patient Instructions (Addendum)
Hysterosalpingography  Hysterosalpingography is a procedure using an X-ray and contrast dye to look at the inside of your uterus and fallopian tubes. The contrast dye is injected into the uterus through the vagina and cervix while X-ray pictures are taken. This procedure may help your caregiver determine whether you have uterine tumors, adhesions, or structural abnormalities. It is commonly used to help determine reasons why a woman is unable to have children (infertile).  LET YOUR CAREGIVER KNOW ABOUT:  · Allergies to medicine or food, especially shellfish.  · Medicines taken, including vitamins, herbs, eyedrops, over-the-counter medicines, and creams.  · Use of steroids (by mouth or creams).  · Previous problems with anesthetics or numbing medicines.  · History of bleeding problems or blood clots.  · Previous pelvic surgery.  · Other health problems, including diabetes and kidney problems.  · Possibility of pregnancy.  · Any allergies to iodine or X-ray dye (contrast dye).  · Any recent pelvic infections or sexually transmitted diseases (STDs).  RISKS AND COMPLICATIONS   · Infection in the lining of the uterus (endometritis) or fallopian tubes (salpingitis).  · Damage or puncturing of the uterus or fallopian tubes.  · An allergic reaction to the contrast dye used to perform the X-ray.  BEFORE THE PROCEDURE   · Schedule the procedure after your period stops, but before your next ovulation. This is usually between day 5 and 10 of your last period. Day 1 is the first day of your period.  · Ask your caregiver about changing or stopping your regular medicines.  · You may eat and drink as normal.  · You may be given a medicine to relax you (sedative) or an over-the-counter pain medicine to lessen any discomfort during the procedure.  · Empty your bladder before the procedure begins.  PROCEDURE  · You will lie down on an X-ray table with your feet in stirrups.  · A device called a speculum will be placed into your  vagina. This allows your caregiver to see inside your vagina to the cervix.  · The cervix will be washed with a special soap.  · A thin, flexible tube will be passed through the cervix into the uterus.  · Contrast dye will be put into this tube.  · Several X-rays will be taken as the contrast dye spreads through the uterus and fallopian tubes.  · The tube will be taken out after the procedure.  · The procedure usually lasts about 15 to 30 minutes.  AFTER THE PROCEDURE   · Most of the contrast dye will flow out naturally. You may want to wear a sanitary pad.  · You may have cramping and spotting. This should go away in 24 hours.  · Ask when your test results will be ready. Make sure you get your test results.   Document Released: 08/12/2004 Document Revised: 10/02/2011 Document Reviewed: 05/30/2011  ExitCare® Patient Information ©2013 [employer].

## 2012-10-02 NOTE — Progress Notes (Signed)
Patient presented to the office today to discuss an ultrasound that was ordered from last visit on February 28 as a result of chronic lower pelvic discomfort. Review of her record indicated she was treated for trichomoniasis on 08/08/2012 with Flagyl 500 mg twice a day for 7 days and then an office visit February 28 she was treated for bacterial vaginosis with MetroGel vaginal cream for 5 days. Due to her abdominal girth she is here to have an ultrasound with the following results noted: Uterus measured 8.5 x 4.3 x 3.2 cm with endometrial stripe of 5.7 mm. Right and left ovary numerous small follicles.  Patient has voiced today that she has been using unprotected intercourse for several years with the same partner. Her partner has had children with another partner in the past. Patient also has had history of Chlamydia infection and HSV in the past as well. She had one pregnancy resulting in a miscarriage with a prior relationship.  Patient just completed her last menstrual cycle a few days ago. We are going to schedule a sonohysterogram for assessment of her fallopian tube to see if this is the cause of her infertility. We are also going to schedule a semen analysis for her partner and she will see me in one week after the above tests are completed. She also had voiced that she has noticed some scant hair underneath her chin nipple and peri-umbilicus region. We are going to draw the following labs today: 17 hydroxyprogesterone, DHEAS, total testosterone, as well as hemoglobin A1c and TSH. Although patient is menstruating she has a PCO S. Like appearance. She will be placed on Vibramycin 100 mg twice a day the day before the procedure the day of the procedure and the day after the procedure for prophylaxis.

## 2012-10-03 ENCOUNTER — Other Ambulatory Visit: Payer: Self-pay | Admitting: Gynecology

## 2012-10-03 DIAGNOSIS — R7309 Other abnormal glucose: Secondary | ICD-10-CM

## 2012-10-03 LAB — TESTOSTERONE: Testosterone: 49 ng/dL (ref 10–70)

## 2012-10-03 LAB — TSH: TSH: 0.98 u[IU]/mL (ref 0.350–4.500)

## 2012-10-04 ENCOUNTER — Other Ambulatory Visit: Payer: BC Managed Care – PPO

## 2012-10-07 ENCOUNTER — Telehealth: Payer: Self-pay | Admitting: *Deleted

## 2012-10-07 NOTE — Telephone Encounter (Signed)
Spoke with patient and HSG couldn't be schedule due to pt LMP. I told her to call back on 1st day of her cycle and we will schedule HSG. Pt informed not to take Vibramycin 100 mg .

## 2012-10-07 NOTE — Telephone Encounter (Signed)
Message copied by Aura Camps on Mon Oct 07, 2012  9:51 AM ------      Message from: Ok Edwards      Created: Wed Oct 02, 2012 12:52 PM       Hayley Webb patient needs to be scheduled for HSG at [facility]. She finished her menses 3 days ago. Check with Sherrilyn Rist to schedule SA for partner. I need to see her in consultation the week after the test. ------

## 2012-10-08 LAB — 17-HYDROXYPROGESTERONE: 17-OH-Progesterone, LC/MS/MS: 22 ng/dL

## 2012-10-18 ENCOUNTER — Ambulatory Visit (INDEPENDENT_AMBULATORY_CARE_PROVIDER_SITE_OTHER): Payer: BC Managed Care – PPO | Admitting: Gynecology

## 2012-10-18 ENCOUNTER — Encounter: Payer: Self-pay | Admitting: Gynecology

## 2012-10-18 DIAGNOSIS — R300 Dysuria: Secondary | ICD-10-CM

## 2012-10-18 DIAGNOSIS — N899 Noninflammatory disorder of vagina, unspecified: Secondary | ICD-10-CM

## 2012-10-18 DIAGNOSIS — N760 Acute vaginitis: Secondary | ICD-10-CM

## 2012-10-18 DIAGNOSIS — A499 Bacterial infection, unspecified: Secondary | ICD-10-CM

## 2012-10-18 DIAGNOSIS — B9689 Other specified bacterial agents as the cause of diseases classified elsewhere: Secondary | ICD-10-CM

## 2012-10-18 DIAGNOSIS — N898 Other specified noninflammatory disorders of vagina: Secondary | ICD-10-CM

## 2012-10-18 LAB — URINALYSIS W MICROSCOPIC + REFLEX CULTURE
Bacteria, UA: NEGATIVE
Bilirubin Urine: NEGATIVE
Nitrite: NEGATIVE
Protein, ur: 30 mg/dL — AB
Specific Gravity, Urine: >1.03 — ABNORMAL HIGH (ref 1.005–1.030)
Urobilinogen, UA: 0.2 mg/dL (ref 0.0–1.0)

## 2012-10-18 LAB — WET PREP FOR TRICH, YEAST, CLUE: Trich, Wet Prep: NONE SEEN

## 2012-10-18 MED ORDER — CLINDAMYCIN HCL 300 MG PO CAPS: 300.0000 mg | ORAL_CAPSULE | Freq: Three times a day (TID) | ORAL | Status: DC

## 2012-10-18 MED ORDER — FLUCONAZOLE 100 MG PO TABS: ORAL_TABLET | ORAL | Status: DC

## 2012-10-18 NOTE — Progress Notes (Signed)
Patient presented to the office today complaining of vaginal burning irritation. She was seen in February 12 of this year for an ultrasound as a result of patient having complaint of chronic nonspecific low pelvic discomfort. The ultrasound was normal. Review of her record indicated she was treated for trichomoniasis on 08/08/2012 with Flagyl 500 mg twice a day for 7 days and then an office visit February 28 she was treated for bacterial vaginosis with MetroGel vaginal cream for 5 days. She is sexually active and not using any form of contraception with her fianc. Patient also has had history of Chlamydia infection and HSV in the past as well. Every 28 2014 patient had a negative GC and Chlamydia culture.  Exam today: Bartholin urethra Skene was within normal limits Vagina: Thick white discharge with hyperemic vaginal mucosa Cervix: Same as above Uterus: Not examined Adnexa: Not examined Rectal exam: Not examined  Urinalysis 7-10 WBC, 3-6 RBC, many bacteria urine culture pending.  Wet prep moderate yeast, many clue cells, too numerous to count WBC, too numerous to count bacteria,, Positive Amine  Assessment/plan: Since patient's last menstrual period was early part of this month and she has been having unprotected intercourse for her yeast infection she could use Monistat for 3 days vaginally. For her BV she will be placed on clindamycin 300 mg one by mouth twice a day for 7 days. If her period or discharge and she does not complete the Monistat I have given her prescription of Diflucan 100 mg which she can take only if she starts her menses. Will notify her if there is any growth on her urine culture.

## 2012-10-18 NOTE — Patient Instructions (Addendum)
Monilial Vaginitis Vaginitis in a soreness, swelling and redness (inflammation) of the vagina and vulva. Monilial vaginitis is not a sexually transmitted infection. CAUSES  Yeast vaginitis is caused by yeast (candida) that is normally found in your vagina. With a yeast infection, the candida has overgrown in number to a point that upsets the chemical balance. SYMPTOMS   White, thick vaginal discharge.  Swelling, itching, redness and irritation of the vagina and possibly the lips of the vagina (vulva).  Burning or painful urination.  Painful intercourse. DIAGNOSIS  Things that may contribute to monilial vaginitis are:  Postmenopausal and virginal states.  Pregnancy.  Infections.  Being tired, sick or stressed, especially if you had monilial vaginitis in the past.  Diabetes. Good control will help lower the chance.  Birth control pills.  Tight fitting garments.  Using bubble bath, feminine sprays, douches or deodorant tampons.  Taking certain medications that kill germs (antibiotics).  Sporadic recurrence can occur if you become ill. TREATMENT  Your caregiver will give you medication.  There are several kinds of anti monilial vaginal creams and suppositories specific for monilial vaginitis. For recurrent yeast infections, use a suppository or cream in the vagina 2 times a week, or as directed.  Anti-monilial or steroid cream for the itching or irritation of the vulva may also be used. Get your caregiver's permission.  Painting the vagina with methylene blue solution may help if the monilial cream does not work.  Eating yogurt may help prevent monilial vaginitis. HOME CARE INSTRUCTIONS   Finish all medication as prescribed.  Do not have sex until treatment is completed or after your caregiver tells you it is okay.  Take warm sitz baths.  Do not douche.  Do not use tampons, especially scented ones.  Wear cotton underwear.  Avoid tight pants and panty  hose.  Tell your sexual partner that you have a yeast infection. They should go to their caregiver if they have symptoms such as mild rash or itching.  Your sexual partner should be treated as well if your infection is difficult to eliminate.  Practice safer sex. Use condoms.  Some vaginal medications cause latex condoms to fail. Vaginal medications that harm condoms are:  Cleocin cream.  Butoconazole (Femstat).  Terconazole (Terazol) vaginal suppository.  Miconazole (Monistat) (may be purchased over the counter). SEEK MEDICAL CARE IF:   You have a temperature by mouth above 102 F (38.9 C).  The infection is getting worse after 2 days of treatment.  The infection is not getting better after 3 days of treatment.  You develop blisters in or around your vagina.  You develop vaginal bleeding, and it is not your menstrual period.  You have pain when you urinate.  You develop intestinal problems.  You have pain with sexual intercourse. Document Released: 04/19/2005 Document Revised: 10/02/2011 Document Reviewed: 01/01/2009 [facility] Patient Information 2013 [place], [place].   Bacterial Vaginosis Bacterial vaginosis (BV) is a vaginal infection where the normal balance of bacteria in the vagina is disrupted. The normal balance is then replaced by an overgrowth of certain bacteria. There are several different kinds of bacteria that can cause BV. BV is the most common vaginal infection in women of childbearing age. CAUSES   The cause of BV is not fully understood. BV develops when there is an increase or imbalance of harmful bacteria.  Some activities or behaviors can upset the normal balance of bacteria in the vagina and put women at increased risk including:  Having a new sex partner  or multiple sex partners.  Douching.  Using an intrauterine device (IUD) for contraception.  It is not clear what role sexual activity plays in the development of BV. However, women that  have never had sexual intercourse are rarely infected with BV. Women do not get BV from toilet seats, bedding, swimming pools or from touching objects around them.  SYMPTOMS   Grey vaginal discharge.  A fish-like odor with discharge, especially after sexual intercourse.  Itching or burning of the vagina and vulva.  Burning or pain with urination.  Some women have no signs or symptoms at all. DIAGNOSIS  Your caregiver must examine the vagina for signs of BV. Your caregiver will perform lab tests and look at the sample of vaginal fluid through a microscope. They will look for bacteria and abnormal cells (clue cells), a pH test higher than 4.5, and a positive amine test all associated with BV.  RISKS AND COMPLICATIONS   Pelvic inflammatory disease (PID).  Infections following gynecology surgery.  Developing HIV.  Developing herpes virus. TREATMENT  Sometimes BV will clear up without treatment. However, all women with symptoms of BV should be treated to avoid complications, especially if gynecology surgery is planned. Female partners generally do not need to be treated. However, BV may spread between female sex partners so treatment is helpful in preventing a recurrence of BV.   BV may be treated with antibiotics. The antibiotics come in either pill or vaginal cream forms. Either can be used with nonpregnant or pregnant women, but the recommended dosages differ. These antibiotics are not harmful to the baby.  BV can recur after treatment. If this happens, a second round of antibiotics will often be prescribed.  Treatment is important for pregnant women. If not treated, BV can cause a premature delivery, especially for a pregnant woman who had a premature birth in the past. All pregnant women who have symptoms of BV should be checked and treated.  For chronic reoccurrence of BV, treatment with a type of prescribed gel vaginally twice a week is helpful. HOME CARE INSTRUCTIONS   Finish all  medication as directed by your caregiver.  Do not have sex until treatment is completed.  Tell your sexual partner that you have a vaginal infection. They should see their caregiver and be treated if they have problems, such as a mild rash or itching.  Practice safe sex. Use condoms. Only have 1 sex partner. PREVENTION  Basic prevention steps can help reduce the risk of upsetting the natural balance of bacteria in the vagina and developing BV:  Do not have sexual intercourse (be abstinent).  Do not douche.  Use all of the medicine prescribed for treatment of BV, even if the signs and symptoms go away.  Tell your sex partner if you have BV. That way, they can be treated, if needed, to prevent reoccurrence. SEEK MEDICAL CARE IF:   Your symptoms are not improving after 3 days of treatment.  You have increased discharge, pain, or fever. MAKE SURE YOU:   Understand these instructions.  Will watch your condition.  Will get help right away if you are not doing well or get worse. FOR MORE INFORMATION  Division of STD Prevention (DSTDP), Centers for Disease Control and Prevention: [employer] American Social Health Association (ASHA): www.ashastd.org  Document Released: 07/10/2005 Document Revised: 10/02/2011 Document Reviewed: 12/31/2008 [facility] Patient Information 2013 [place], [place].

## 2012-10-19 LAB — URINE CULTURE: Organism ID, Bacteria: NO GROWTH

## 2012-11-01 ENCOUNTER — Other Ambulatory Visit: Payer: Self-pay

## 2012-11-25 ENCOUNTER — Encounter (HOSPITAL_BASED_OUTPATIENT_CLINIC_OR_DEPARTMENT_OTHER): Payer: Self-pay | Admitting: *Deleted

## 2012-11-25 ENCOUNTER — Emergency Department (HOSPITAL_BASED_OUTPATIENT_CLINIC_OR_DEPARTMENT_OTHER)
Admission: EM | Admit: 2012-11-25 | Discharge: 2012-11-26 | Disposition: A | Discharge status: Home / Self Care | Payer: BC Managed Care – PPO | Attending: Emergency Medicine | Admitting: Emergency Medicine

## 2012-11-25 DIAGNOSIS — M791 Myalgia, unspecified site: Secondary | ICD-10-CM

## 2012-11-25 DIAGNOSIS — J45909 Unspecified asthma, uncomplicated: Secondary | ICD-10-CM | POA: Insufficient documentation

## 2012-11-25 DIAGNOSIS — IMO0001 Reserved for inherently not codable concepts without codable children: Secondary | ICD-10-CM | POA: Insufficient documentation

## 2012-11-25 DIAGNOSIS — F3289 Other specified depressive episodes: Secondary | ICD-10-CM | POA: Insufficient documentation

## 2012-11-25 DIAGNOSIS — Z79899 Other long term (current) drug therapy: Secondary | ICD-10-CM | POA: Insufficient documentation

## 2012-11-25 DIAGNOSIS — I10 Essential (primary) hypertension: Secondary | ICD-10-CM | POA: Insufficient documentation

## 2012-11-25 DIAGNOSIS — Z3202 Encounter for pregnancy test, result negative: Secondary | ICD-10-CM | POA: Insufficient documentation

## 2012-11-25 DIAGNOSIS — Z8739 Personal history of other diseases of the musculoskeletal system and connective tissue: Secondary | ICD-10-CM | POA: Insufficient documentation

## 2012-11-25 DIAGNOSIS — F329 Major depressive disorder, single episode, unspecified: Secondary | ICD-10-CM | POA: Insufficient documentation

## 2012-11-25 DIAGNOSIS — Z8619 Personal history of other infectious and parasitic diseases: Secondary | ICD-10-CM | POA: Insufficient documentation

## 2012-11-25 LAB — PREGNANCY, URINE: Preg Test, Ur: NEGATIVE

## 2012-11-25 LAB — URINALYSIS, ROUTINE W REFLEX MICROSCOPIC
Bilirubin Urine: NEGATIVE
Ketones, ur: NEGATIVE mg/dL
Leukocytes, UA: NEGATIVE
Nitrite: NEGATIVE
Specific Gravity, Urine: 1.03 (ref 1.005–1.030)
Urobilinogen, UA: 1 mg/dL (ref 0.0–1.0)

## 2012-11-25 LAB — CBC WITH DIFFERENTIAL/PLATELET
Basophils Absolute: 0 10*3/uL (ref 0.0–0.1)
Basophils Relative: 1 % (ref 0–1)
Lymphocytes Relative: 36 % (ref 12–46)
MCHC: 33.2 g/dL (ref 30.0–36.0)
Neutro Abs: 3.2 10*3/uL (ref 1.7–7.7)
Neutrophils Relative %: 53 % (ref 43–77)
Platelets: 325 10*3/uL (ref 150–400)
RDW: 13.6 % (ref 11.5–15.5)
WBC: 5.9 10*3/uL (ref 4.0–10.5)

## 2012-11-25 LAB — COMPREHENSIVE METABOLIC PANEL
ALT: 14 U/L (ref 0–35)
AST: 19 U/L (ref 0–37)
Albumin: 3.8 g/dL (ref 3.5–5.2)
Chloride: 101 mEq/L (ref 96–112)
Creatinine, Ser: 0.8 mg/dL (ref 0.50–1.10)
Potassium: 3.5 mEq/L (ref 3.5–5.1)
Sodium: 139 mEq/L (ref 135–145)
Total Bilirubin: 0.2 mg/dL — ABNORMAL LOW (ref 0.3–1.2)

## 2012-11-25 LAB — D-DIMER, QUANTITATIVE: D-Dimer, Quant: 0.36 ug/mL-FEU (ref 0.00–0.48)

## 2012-11-25 MED ORDER — OXYCODONE-ACETAMINOPHEN 5-325 MG PO TABS
2.0000 | ORAL_TABLET | Freq: Once | ORAL | Status: AC
  Administered 2012-11-25: 2 via ORAL
  Filled 2012-11-25 (×2): qty 2

## 2012-11-25 MED ORDER — IBUPROFEN 800 MG PO TABS
800.0000 mg | ORAL_TABLET | Freq: Once | ORAL | Status: AC
  Administered 2012-11-25: 800 mg via ORAL
  Filled 2012-11-25: qty 1

## 2012-11-25 NOTE — ED Notes (Signed)
Pain in her legs feet and lower back x 1 week.

## 2012-11-25 NOTE — ED Provider Notes (Signed)
History  This chart was scribed for Glynn Octave, MD by Shari Heritage, ED Scribe. The patient was seen in room MH12/MH12. Patient's care was started at 2252.   CSN: [id number]  Arrival date & time 11/25/12  2150   First MD Initiated Contact with Patient 11/25/12 2252      Chief Complaint  Patient presents with  . Leg Pain    The history is provided by the patient. No language interpreter was used.     HPI Comments: Hayley Webb is a 27 y.o. female who presents to the Emergency Department complaining of moderate to severe, constant aching pain to entirety of both legs onset 1 week ago. Pain is worse with ambulation. She states that she has been having pain with bowel movements. Patient denies fever, vomiting, dysuria, hematuria, chest pain, shortness of breath, decreased activity bowel incontinence or bladder incontinence. Patient reports that she saw her doctor on Friday who prescribed a muscle relaxant, but she denies any relief after taking. She denies any history of DVT/PE. She hasn't been taking prednisone. has a medical history of lupus, hypertension, asthma and depression. She takes Plaquenil to treat Lupus.  Rheumatologist - Francee Gentile  Past Medical History  Diagnosis Date  . 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 HSV infection   . Asthma   . Lupus   . Depression   . Hypertension     Past Surgical History  Procedure Laterality Date  . Tonsillectomy and adenoidectomy  1998  . Mouth surgery      2 teeth removed- 1 wisdom and 1 in front of it    Family History  Problem Relation Age of Onset  . Diabetes Maternal Aunt   . Cancer Maternal Grandmother 72    COLON CA  . Diabetes Maternal Grandmother   . Hypertension Maternal Grandfather   . Asthma Mother   . Asthma Brother   . Anesthesia problems Neg Hx   . Hypotension Neg Hx   . Malignant hyperthermia Neg Hx   . Pseudochol deficiency Neg Hx     History  Substance Use Topics  .  Smoking status: Never Smoker   . Smokeless tobacco: Never Used  . Alcohol Use: No    OB History   Grav Para Term Preterm Abortions TAB SAB Ect Mult Living   1    1  1          Review of Systems A complete 10 system review of systems was obtained and all systems are negative except as noted in the HPI and PMH.   Allergies  Bactrim and Prednisone  Home Medications   Current Outpatient Rx  Name  Route  Sig  Dispense  Refill  . albuterol (PROVENTIL HFA;VENTOLIN HFA) 108 (90 BASE) MCG/ACT inhaler   Inhalation   Inhale 2 puffs into the lungs every 6 (six) hours as needed. For shortness of breath          . Biotin 1 MG CAPS   Oral   Take by mouth.         . clindamycin (CLEOCIN) 300 MG capsule   Oral   Take 1 capsule (300 mg total) by mouth 3 (three) times daily.   14 capsule   0   . doxycycline (VIBRAMYCIN) 100 MG capsule   Oral   Take 1 capsule (100 mg total) by mouth 2 (two) times daily. Take one tablet twice a day for three days starting the day before the  procedure.   6 capsule   0   . fluconazole (DIFLUCAN) 100 MG tablet      Take one tablet today may repeat in 2 days   2 tablet   1   . HYDROcodone-acetaminophen (NORCO/VICODIN) 5-325 MG per tablet   Oral   Take 2 tablets by mouth every 4 (four) hours as needed for pain.   10 tablet   0   . hydroxychloroquine (PLAQUENIL) 200 MG tablet   Oral   Take 200 mg by mouth daily.         Marland Kitchen ibuprofen (ADVIL,MOTRIN) 800 MG tablet   Oral   Take 1 tablet (800 mg total) by mouth 3 (three) times daily.   21 tablet   0   . methylPREDNISolone (MEDROL) 8 MG tablet   Oral   Take 8 mg by mouth daily.         . metroNIDAZOLE (METROGEL VAGINAL) 0.75 % vaginal gel   Vaginal   Place 1 Applicatorful vaginally 2 (two) times daily. For 5 days   70 g   0   . nitrofurantoin, macrocrystal-monohydrate, (MACROBID) 100 MG capsule   Oral   Take 1 capsule (100 mg total) by mouth 2 (two) times daily.   14 capsule   0    . Prenatal Vit-Fe Fumarate-FA (PRENATAL MULTIVITAMIN) TABS   Oral   Take 1 tablet by mouth daily.           BP 141/94  Pulse 86  Temp(Src) 97.9 F (36.6 C) (Oral)  Resp 18  Wt 280 lb (127.007 kg)  BMI 41.33 kg/m2  SpO2 98%  Physical Exam  Constitutional: She is oriented to person, place, and time. She appears well-developed and well-nourished.  HENT:  Head: Normocephalic and atraumatic.  Mouth/Throat: Oropharynx is clear and moist.  Eyes: Conjunctivae and EOM are normal. Pupils are equal, round, and reactive to light.  Neck: Normal range of motion. Neck supple.  Cardiovascular: Normal rate and regular rhythm.   Pulmonary/Chest: Effort normal and breath sounds normal.  Abdominal: Soft. There is no tenderness.  Musculoskeletal: Normal range of motion.  No calf asymmetry. No palpable cords. No erythema. Full ROM of all joints.   Neurological: She is alert and oriented to person, place, and time.  5/5 strength in bilateral lower extremities. Ankle plantar and dorsiflexion intact. Great toe extension intact bilaterally. +2 DP and PT pulses. +2 patellar reflexes bilaterally. Normal gait.  Skin: Skin is warm and dry. No rash noted.  Psychiatric: She has a normal mood and affect. Her behavior is normal.    ED Course  Procedures (including critical care time) DIAGNOSTIC STUDIES: Oxygen Saturation is 98% on room air, normal by my interpretation.    COORDINATION OF CARE: 10:53 PM- Patient informed of current plan for treatment and evaluation and agrees with plan at this time.   12:03 AM- Patient states she is having continued tingling but other discomfort improved after Percocet and ibuprofen. Updated patient on results of labs which did not indicate DVT/PE. Suspect nerve related pain. Will treat. Patient advised to follow up with rheumatologist.   Labs Reviewed  COMPREHENSIVE METABOLIC PANEL - Abnormal; Notable for the following:    Total Bilirubin 0.2 (*)    All other  components within normal limits  CK - Abnormal; Notable for the following:    Total CK 220 (*)    All other components within normal limits  PREGNANCY, URINE  URINALYSIS, ROUTINE W REFLEX MICROSCOPIC  CBC WITH DIFFERENTIAL  D-DIMER, QUANTITATIVE     1. Myalgia       MDM  Bilateral lower extremity muscle aches with tingling for the past one week. No injury. No back pain contrary to triage note. No incontinence, weakness, fever chills. No injury. No chest pain or SOB.  Full range of motion of all major joints. No erythema.  electrolytes normal. CK 220. D-dimer negative.   Suspect neuropathic pain.  DVT unlikely with negative D-dimer.  Electrolytes wnl. CK only mildly elevated. Short course of pain meds given, followup with rheumatologist.  I personally performed the services described in this documentation, which was scribed in my presence. The recorded information has been reviewed and is accurate.   Glynn Octave, MD 11/26/12 [phone]

## 2012-11-26 MED ORDER — IBUPROFEN 800 MG PO TABS: 800.0000 mg | ORAL_TABLET | Freq: Three times a day (TID) | ORAL | Status: DC

## 2012-11-26 MED ORDER — HYDROCODONE-ACETAMINOPHEN 5-325 MG PO TABS: 2.0000 | ORAL_TABLET | ORAL | Status: DC | PRN

## 2012-11-26 NOTE — ED Notes (Signed)
MD at bedside. 

## 2012-12-20 ENCOUNTER — Ambulatory Visit: Payer: BC Managed Care – PPO | Admitting: Gynecology

## 2012-12-26 ENCOUNTER — Encounter: Payer: Self-pay | Admitting: Women's Health

## 2012-12-26 ENCOUNTER — Ambulatory Visit (INDEPENDENT_AMBULATORY_CARE_PROVIDER_SITE_OTHER): Payer: BC Managed Care – PPO | Admitting: Women's Health

## 2012-12-26 DIAGNOSIS — B9689 Other specified bacterial agents as the cause of diseases classified elsewhere: Secondary | ICD-10-CM

## 2012-12-26 DIAGNOSIS — A499 Bacterial infection, unspecified: Secondary | ICD-10-CM

## 2012-12-26 DIAGNOSIS — N760 Acute vaginitis: Secondary | ICD-10-CM

## 2012-12-26 LAB — WET PREP FOR TRICH, YEAST, CLUE

## 2012-12-26 MED ORDER — METRONIDAZOLE 0.75 % VA GEL: VAGINAL | Status: DC

## 2012-12-26 NOTE — Patient Instructions (Addendum)
Bacterial Vaginosis Bacterial vaginosis (BV) is a vaginal infection where the normal balance of bacteria in the vagina is disrupted. The normal balance is then replaced by an overgrowth of certain bacteria. There are several different kinds of bacteria that can cause BV. BV is the most common vaginal infection in women of childbearing age. CAUSES   The cause of BV is not fully understood. BV develops when there is an increase or imbalance of harmful bacteria.  Some activities or behaviors can upset the normal balance of bacteria in the vagina and put women at increased risk including:  Having a new sex partner or multiple sex partners.  Douching.  Using an intrauterine device (IUD) for contraception.  It is not clear what role sexual activity plays in the development of BV. However, women that have never had sexual intercourse are rarely infected with BV. Women do not get BV from toilet seats, bedding, swimming pools or from touching objects around them.  SYMPTOMS   Grey vaginal discharge.  A fish-like odor with discharge, especially after sexual intercourse.  Itching or burning of the vagina and vulva.  Burning or pain with urination.  Some women have no signs or symptoms at all. DIAGNOSIS  Your caregiver must examine the vagina for signs of BV. Your caregiver will perform lab tests and look at the sample of vaginal fluid through a microscope. They will look for bacteria and abnormal cells (clue cells), a pH test higher than 4.5, and a positive amine test all associated with BV.  RISKS AND COMPLICATIONS   Pelvic inflammatory disease (PID).  Infections following gynecology surgery.  Developing HIV.  Developing herpes virus. TREATMENT  Sometimes BV will clear up without treatment. However, all women with symptoms of BV should be treated to avoid complications, especially if gynecology surgery is planned. Female partners generally do not need to be treated. However, BV may spread  between female sex partners so treatment is helpful in preventing a recurrence of BV.   BV may be treated with antibiotics. The antibiotics come in either pill or vaginal cream forms. Either can be used with nonpregnant or pregnant women, but the recommended dosages differ. These antibiotics are not harmful to the baby.  BV can recur after treatment. If this happens, a second round of antibiotics will often be prescribed.  Treatment is important for pregnant women. If not treated, BV can cause a premature delivery, especially for a pregnant woman who had a premature birth in the past. All pregnant women who have symptoms of BV should be checked and treated.  For chronic reoccurrence of BV, treatment with a type of prescribed gel vaginally twice a week is helpful. HOME CARE INSTRUCTIONS   Finish all medication as directed by your caregiver.  Do not have sex until treatment is completed.  Tell your sexual partner that you have a vaginal infection. They should see their caregiver and be treated if they have problems, such as a mild rash or itching.  Practice safe sex. Use condoms. Only have 1 sex partner. PREVENTION  Basic prevention steps can help reduce the risk of upsetting the natural balance of bacteria in the vagina and developing BV:  Do not have sexual intercourse (be abstinent).  Do not douche.  Use all of the medicine prescribed for treatment of BV, even if the signs and symptoms go away.  Tell your sex partner if you have BV. That way, they can be treated, if needed, to prevent reoccurrence. SEEK MEDICAL CARE IF:     Your symptoms are not improving after 3 days of treatment.  You have increased discharge, pain, or fever. MAKE SURE YOU:   Understand these instructions.  Will watch your condition.  Will get help right away if you are not doing well or get worse. FOR MORE INFORMATION  Division of STD Prevention (DSTDP), Centers for Disease Control and Prevention:  www.cdc.gov/std American Social Health Association (ASHA): www.ashastd.org  Document Released: 07/10/2005 Document Revised: 10/02/2011 Document Reviewed: 12/31/2008 [employer] Patient Information 2014 [employer].  

## 2012-12-26 NOTE — Progress Notes (Signed)
Patient ID: Hayley Webb, female   DOB: [birth year], 27 y.o.   MRN: [id number] Presents with complaint of external and vaginal irritation with itching denies discharge, no urinary symptoms. Same partner greater than one year with a negative STD screen February 2014. Lupus on plaquenil. Condoms consistently for contraception.  Exam: Obese, external genitalia mild erythema at introitus, speculum exam scant discharge with  erythema, wet prep positive for clues and bacteria. Bimanual Limited no CMT.  Bacteria vaginosis  Plan: MetroGel vaginal cream 1 applicator at bedtime x5, instructed to call if no relief of irritation. Contraception options reviewed, will continue condoms, Mirena IUD information given and reviewed.

## 2012-12-26 NOTE — Addendum Note (Signed)
Addended by: WEBB, JENNIFER L on: 12/26/2012 03:24 PM   Modules accepted: Orders  

## 2013-02-25 ENCOUNTER — Ambulatory Visit (INDEPENDENT_AMBULATORY_CARE_PROVIDER_SITE_OTHER): Payer: BC Managed Care – PPO | Admitting: Gynecology

## 2013-02-25 ENCOUNTER — Encounter: Payer: Self-pay | Admitting: Gynecology

## 2013-02-25 DIAGNOSIS — N898 Other specified noninflammatory disorders of vagina: Secondary | ICD-10-CM

## 2013-02-25 DIAGNOSIS — N912 Amenorrhea, unspecified: Secondary | ICD-10-CM

## 2013-02-25 DIAGNOSIS — Z113 Encounter for screening for infections with a predominantly sexual mode of transmission: Secondary | ICD-10-CM

## 2013-02-25 LAB — WET PREP FOR TRICH, YEAST, CLUE: Trich, Wet Prep: NONE SEEN

## 2013-02-25 LAB — PREGNANCY, URINE: Preg Test, Ur: POSITIVE

## 2013-02-25 NOTE — Addendum Note (Signed)
Addended by: Dayna Barker on: 02/25/2013 11:08 AM   Modules accepted: Orders

## 2013-02-25 NOTE — Patient Instructions (Signed)
Make a new OB appointment with an obstetrical group in [place] over the next 4 weeks. Continue on her prenatal vitamin.

## 2013-02-25 NOTE — Progress Notes (Signed)
Patient presents with LMP 01/20/2013 and positive home pregnancy test x2. Notes a slight vaginal discharge. Was treated for bacterial vaginosis by Harriett Sine with MetroGel early June. The course was stopped early because of her menses and then she continued will remain after her menses. Seemed to transiently get better but now has returned.  Exam with Selena Batten assistant External BUS vagina grossly normal with scant discharge. Cervix normal. GC/chlamydia done Uterus grossly normal midline mobile nontender. Adnexa without masses or tenderness  Wet prep negative. UPT positive  Assessment and plan: Vaginal discharge. Wet prep unimpressive. Given that she is [redacted] weeks pregnant at this point a asked her to hold on treatment and will monitor. If her discharge which were sent or develop other symptoms such as over then we may consider treatment at that time. Patient is comfortable with this. STD screen was GC chlamydia done. UPT positive. Options to do ultrasound now for viability versus referral to an obstetrical group discussed. She's not having any pain or bleeding and prefers to wait and schedule an appointment to see an obstetrician for ongoing care. Names were provided in the [place] area. She will call to make an appointment in knows I want her seen over the next 4 weeks or so. On a prenatal vitamin and will continue on this.

## 2013-02-26 ENCOUNTER — Telehealth: Payer: Self-pay

## 2013-02-26 LAB — GC/CHLAMYDIA PROBE AMP: GC Probe RNA: NEGATIVE

## 2013-02-26 NOTE — Telephone Encounter (Signed)
Patient saw you yesterday and found out she was pregnant.  She said she is having a problem with "gas" and is feeling very bloated and is constipated. She is asking what is safe for her to take to help her feel better.

## 2013-02-26 NOTE — Telephone Encounter (Signed)
I would start with Metamucil or FiberCon and drink plenty of fluids.

## 2013-02-26 NOTE — Telephone Encounter (Signed)
Left detailed message in voicemail.

## 2013-02-27 ENCOUNTER — Encounter (HOSPITAL_COMMUNITY): Payer: Self-pay | Admitting: Emergency Medicine

## 2013-02-27 ENCOUNTER — Emergency Department (HOSPITAL_COMMUNITY)
Admission: EM | Admit: 2013-02-27 | Discharge: 2013-02-28 | Disposition: A | Discharge status: Home / Self Care | Payer: BC Managed Care – PPO | Attending: Emergency Medicine | Admitting: Emergency Medicine

## 2013-02-27 DIAGNOSIS — Z8619 Personal history of other infectious and parasitic diseases: Secondary | ICD-10-CM | POA: Insufficient documentation

## 2013-02-27 DIAGNOSIS — J45909 Unspecified asthma, uncomplicated: Secondary | ICD-10-CM | POA: Insufficient documentation

## 2013-02-27 DIAGNOSIS — R350 Frequency of micturition: Secondary | ICD-10-CM | POA: Insufficient documentation

## 2013-02-27 DIAGNOSIS — R110 Nausea: Secondary | ICD-10-CM | POA: Insufficient documentation

## 2013-02-27 DIAGNOSIS — Z8739 Personal history of other diseases of the musculoskeletal system and connective tissue: Secondary | ICD-10-CM | POA: Insufficient documentation

## 2013-02-27 DIAGNOSIS — R079 Chest pain, unspecified: Secondary | ICD-10-CM

## 2013-02-27 DIAGNOSIS — Z79899 Other long term (current) drug therapy: Secondary | ICD-10-CM | POA: Insufficient documentation

## 2013-02-27 DIAGNOSIS — I10 Essential (primary) hypertension: Secondary | ICD-10-CM | POA: Insufficient documentation

## 2013-02-27 DIAGNOSIS — R0602 Shortness of breath: Secondary | ICD-10-CM | POA: Insufficient documentation

## 2013-02-27 DIAGNOSIS — F411 Generalized anxiety disorder: Secondary | ICD-10-CM | POA: Insufficient documentation

## 2013-02-27 DIAGNOSIS — Z8659 Personal history of other mental and behavioral disorders: Secondary | ICD-10-CM | POA: Insufficient documentation

## 2013-02-27 DIAGNOSIS — F419 Anxiety disorder, unspecified: Secondary | ICD-10-CM

## 2013-02-27 NOTE — ED Notes (Signed)
[id number]<AV> Expected date:02/27/13<BR> Expected time:11:25 PM<BR> Means of arrival:Ambulance<BR> Comments:<BR> ems

## 2013-02-27 NOTE — ED Notes (Signed)
Pt here via ems for c/o anxiety attack per ems pt is 5 wks pregnancy

## 2013-02-28 ENCOUNTER — Other Ambulatory Visit: Payer: Self-pay

## 2013-02-28 LAB — URINALYSIS, ROUTINE W REFLEX MICROSCOPIC
Bilirubin Urine: NEGATIVE
Glucose, UA: NEGATIVE mg/dL
Hgb urine dipstick: NEGATIVE
Ketones, ur: NEGATIVE mg/dL
Leukocytes, UA: NEGATIVE
Nitrite: NEGATIVE
Protein, ur: NEGATIVE mg/dL
Specific Gravity, Urine: 1.027 (ref 1.005–1.030)
Urobilinogen, UA: 1 mg/dL (ref 0.0–1.0)
pH: 7.5 (ref 5.0–8.0)

## 2013-02-28 MED ORDER — ACETAMINOPHEN 325 MG PO TABS
650.0000 mg | ORAL_TABLET | Freq: Once | ORAL | Status: AC
  Administered 2013-02-28: 650 mg via ORAL
  Filled 2013-02-28: qty 2

## 2013-02-28 NOTE — ED Provider Notes (Signed)
Medical screening examination/treatment/procedure(s) were performed by non-physician practitioner and as supervising physician I was immediately available for consultation/collaboration.  Olivia Mackie, MD 02/28/13 [phone]

## 2013-02-28 NOTE — ED Provider Notes (Signed)
CSN: [id number]     Arrival date & time 02/27/13  2350 History     First MD Initiated Contact with Patient 02/28/13 [phone]     Chief Complaint  Patient presents with  . Anxiety   (Consider location/radiation/quality/duration/timing/severity/associated sxs/prior Treatment) HPI Pt is a 27yo female BIB ems c/o anxiety attack.  Pt c/o SOB that started around 2200 this evening, associated with left sided, pinpoint chest pain, constant, sharp in nature, 7/10, radiated intermittently down left arm with some numbness and tingling.  Pt states SOB has resolved since being in ED but CP is still there.  Reports hx of anxiety attacks but states this did not feel like her normal attack because she usually feels like "the world is ending." Denies hx of heart problems.  Does have asthma but has not needed to use inhaler recently.  Denies fever.   Past Medical History  Diagnosis Date  . 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 HSV infection   . Asthma   . Lupus   . Depression   . Hypertension    Past Surgical History  Procedure Laterality Date  . Tonsillectomy and adenoidectomy  1998  . Mouth surgery      2 teeth removed- 1 wisdom and 1 in front of it   Family History  Problem Relation Age of Onset  . Diabetes Maternal Aunt   . Cancer Maternal Grandmother 72    COLON CA  . Diabetes Maternal Grandmother   . Hypertension Maternal Grandfather   . Asthma Mother   . Asthma Brother   . Anesthesia problems Neg Hx   . Hypotension Neg Hx   . Malignant hyperthermia Neg Hx   . Pseudochol deficiency Neg Hx    History  Substance Use Topics  . Smoking status: Never Smoker   . Smokeless tobacco: Never Used  . Alcohol Use: No   OB History   Grav Para Term Preterm Abortions TAB SAB Ect Mult Living   2    1  1         Review of Systems  Constitutional: Negative for fever.  Respiratory: Positive for shortness of breath. Negative for cough, chest tightness and wheezing.    Cardiovascular: Positive for chest pain.  Gastrointestinal: Positive for nausea. Negative for vomiting, abdominal pain and diarrhea.  Genitourinary: Positive for frequency. Negative for dysuria.  All other systems reviewed and are negative.    Allergies  Bactrim; Other; Pineapple; and Prednisone  Home Medications   Current Outpatient Rx  Name  Route  Sig  Dispense  Refill  . methylPREDNISolone (MEDROL) 4 MG tablet   Oral   Take 4 mg by mouth daily.         . Prenatal Vit-Fe Fumarate-FA (PRENATAL MULTIVITAMIN) TABS   Oral   Take 1 tablet by mouth daily.         Marland Kitchen albuterol (PROVENTIL HFA;VENTOLIN HFA) 108 (90 BASE) MCG/ACT inhaler   Inhalation   Inhale 2 puffs into the lungs every 6 (six) hours as needed. For shortness of breath          . hydroxychloroquine (PLAQUENIL) 200 MG tablet   Oral   Take 200 mg by mouth daily.          BP 129/83  Pulse 91  Resp 13  Ht 5\' 9"  (1.753 m)  Wt 320 lb (145.151 kg)  BMI 47.23 kg/m2  SpO2 100%  LMP 01/20/2013 Physical Exam  Nursing note and vitals reviewed.  Constitutional: She appears well-developed and well-nourished. No distress.  Pt sitting upright, comfortably in exam bed, NAD.  HENT:  Head: Normocephalic and atraumatic.  Eyes: Conjunctivae are normal. No scleral icterus.  Neck: Normal range of motion.  Cardiovascular: Normal rate, regular rhythm and normal heart sounds.   Pulmonary/Chest: Effort normal and breath sounds normal. No respiratory distress. She has no wheezes. She has no rales. She exhibits no tenderness.  No respiratory distress, able to speak in full sentences.   Abdominal: Soft. Bowel sounds are normal. She exhibits no distension and no mass. There is no tenderness. There is no rebound and no guarding.  Musculoskeletal: Normal range of motion.  Neurological: She is alert.  Skin: Skin is warm and dry. She is not diaphoretic.  Psychiatric: She has a normal mood and affect. Her behavior is normal.     ED Course   Procedures (including critical care time)  Labs Reviewed  URINALYSIS, ROUTINE W REFLEX MICROSCOPIC    Date: 02/28/2013  Rate: 86  Rhythm: normal sinus rhythm  QRS Axis: normal  Intervals: normal  ST/T Wave abnormalities: normal  Conduction Disutrbances:none  Narrative Interpretation:   Old EKG Reviewed: unchanged    No results found. 1. Anxiety   2. Chest pain     MDM  Pt presenting with symptoms of anxiety.  Will tx CP with acetaminophen check UA and reevaluate.  Vitals: unremarkable. EKG: NSR.  UA: unremarkable Tx: acetaminophen.    2:39 AM Pt states she is feeling better and feels comfortable being discharged home.   Will discharge pt home and have her f/u with Dr. Jacqlyn Larsen, PCP, and her OB/GYN for ongoing prenatal care.  Return precautions given.  Pt verbalized understanding and agreement with tx plan. Vitals: unremarkable. Discharged in stable condition.    Discussed pt with attending during ED encounter.   Junius Finner, PA-C 02/28/13 [phone]

## 2013-03-01 ENCOUNTER — Inpatient Hospital Stay (HOSPITAL_COMMUNITY)
Admission: AD | Admit: 2013-03-01 | Discharge: 2013-03-01 | Disposition: A | Discharge status: Home / Self Care | Payer: BC Managed Care – PPO | Source: Ambulatory Visit | Attending: Gynecology | Admitting: Gynecology

## 2013-03-01 ENCOUNTER — Encounter (HOSPITAL_COMMUNITY): Payer: Self-pay | Admitting: *Deleted

## 2013-03-01 DIAGNOSIS — O99891 Other specified diseases and conditions complicating pregnancy: Secondary | ICD-10-CM | POA: Insufficient documentation

## 2013-03-01 DIAGNOSIS — R350 Frequency of micturition: Secondary | ICD-10-CM | POA: Insufficient documentation

## 2013-03-01 DIAGNOSIS — K5900 Constipation, unspecified: Secondary | ICD-10-CM | POA: Insufficient documentation

## 2013-03-01 DIAGNOSIS — N949 Unspecified condition associated with female genital organs and menstrual cycle: Secondary | ICD-10-CM | POA: Insufficient documentation

## 2013-03-01 LAB — URINALYSIS, ROUTINE W REFLEX MICROSCOPIC
Nitrite: NEGATIVE
Specific Gravity, Urine: 1.02 (ref 1.005–1.030)
Urobilinogen, UA: 0.2 mg/dL (ref 0.0–1.0)

## 2013-03-01 NOTE — MAU Note (Addendum)
Pt reports she had sharp pains in her vagina this morning. Feels like there is a lump on the inside. Denies vag bleeding or discharge.

## 2013-03-01 NOTE — MAU Provider Note (Signed)
History     CSN: [id number]  Arrival date and time: 03/01/13 6295   First Provider Initiated Contact with Patient 03/01/13 0825      Chief Complaint  Patient presents with  . Groin Swelling   HPI Hayley Webb 27 y.o. [redacted] week gestation.  Begins prenatal care later this week with [facility].  Has been urinating frequently in the past 24 hours.  Felt a lump inside the vagina and is very worried.  Denies vaginal bleeding or problems with vaginal discharge.  No itching or burning.   OB History   Grav Para Term Preterm Abortions TAB SAB Ect Mult Living   2    1  1          Past Medical History  Diagnosis Date  . 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 HSV infection   . Asthma   . Lupus   . Depression   . Hypertension   Trichomonas in Jan. 2014  Past Surgical History  Procedure Laterality Date  . Tonsillectomy and adenoidectomy  1998  . Mouth surgery      2 teeth removed- 1 wisdom and 1 in front of it    Family History  Problem Relation Age of Onset  . Diabetes Maternal Aunt   . Cancer Maternal Grandmother 72    COLON CA  . Diabetes Maternal Grandmother   . Hypertension Maternal Grandfather   . Asthma Mother   . Asthma Brother   . Anesthesia problems Neg Hx   . Hypotension Neg Hx   . Malignant hyperthermia Neg Hx   . Pseudochol deficiency Neg Hx     History  Substance Use Topics  . Smoking status: Never Smoker   . Smokeless tobacco: Never Used  . Alcohol Use: No    Allergies:  Allergies  Allergen Reactions  . Bactrim Anaphylaxis and Hives  . Prednisone Anaphylaxis and Hives  . Other Other (See Comments)    Shrimp:throat itching "can eat other shellfish"  . Pineapple Itching    Prescriptions prior to admission  Medication Sig Dispense Refill  . Prenatal Vit-Fe Fumarate-FA (PRENATAL MULTIVITAMIN) TABS Take 1 tablet by mouth daily.      . Simethicone (GAS-X PO) Take 1 tablet by mouth daily as needed (gas).      Marland Kitchen  albuterol (PROVENTIL HFA;VENTOLIN HFA) 108 (90 BASE) MCG/ACT inhaler Inhale 2 puffs into the lungs every 6 (six) hours as needed. For shortness of breath         Review of Systems  Constitutional: Negative for fever.  Gastrointestinal: Positive for constipation. Negative for nausea, vomiting, abdominal pain and diarrhea.  Genitourinary: Positive for frequency. Negative for hematuria.       No vaginal discharge. No vaginal bleeding. No dysuria.   Physical Exam   Blood pressure 129/73, pulse 89, temperature 97.4 F (36.3 C), temperature source Oral, resp. rate 18, height 5\' 9"  (1.753 m), last menstrual period 01/20/2013.  Physical Exam  Nursing note and vitals reviewed. Constitutional: She is oriented to person, place, and time. She appears well-developed and well-nourished. No distress.  HENT:  Head: Normocephalic.  Eyes: EOM are normal.  Neck: Neck supple.  GI: Soft. There is no tenderness. There is no rebound and no guarding.  Genitourinary:  Speculum exam: Vulva:  No abnormalities seen Vagina - Minimal amount of creamy discharge, no odor Cervix - No contact bleeding, appears closed Bimanual exam: Vagina - very prominent "cording" of urethral tissue, no masses, nontender  Cervix closed Uterus unable to size due to habitus Adnexa unable to size due to habitus Chaperone present for exam.  Musculoskeletal: Normal range of motion.  Neurological: She is alert and oriented to person, place, and time.  Skin: Skin is warm and dry.  Psychiatric: She has a normal mood and affect.    MAU Course  Procedures Results for orders placed during the hospital encounter of 03/01/13 (from the past 24 hour(s))  URINALYSIS, ROUTINE W REFLEX MICROSCOPIC     Status: None   Collection Time    03/01/13  7:30 AM      Result Value Range   Color, Urine YELLOW  YELLOW   APPearance CLEAR  CLEAR   Specific Gravity, Urine 1.020  1.005 - 1.030   pH 7.0  5.0 - 8.0   Glucose, UA NEGATIVE  NEGATIVE  mg/dL   Hgb urine dipstick NEGATIVE  NEGATIVE   Bilirubin Urine NEGATIVE  NEGATIVE   Ketones, ur NEGATIVE  NEGATIVE mg/dL   Protein, ur NEGATIVE  NEGATIVE mg/dL   Urobilinogen, UA 0.2  0.0 - 1.0 mg/dL   Nitrite NEGATIVE  NEGATIVE   Leukocytes, UA NEGATIVE  NEGATIVE    MDM Was seen at [facility] on Tuesday and had STD testing done there.  Will not repeat today.  Vaginal exam within normal limits.  Will be rechecked at prenatal visit.  Assessment and Plan  Early pregnancy Normal exam No UTI found Urinary frequency Constipation  Plan Client will check with provider for STD results Keep appointment for prenatal care this week. No smoking, no drugs, no alcohol.  Take a prenatal vitamin one by mouth every day.  Eat small frequent snacks to avoid nausea.  Begin prenatal care as soon as possible. Reviewed stool softeners and other measures to decrease constipation. Jacoby Ritsema 03/01/2013, 8:39 AM

## 2013-03-03 NOTE — MAU Provider Note (Signed)
Note reviewed, agree with plan.  Nazariah Cadet, MD  

## 2013-03-05 LAB — OB RESULTS CONSOLE RPR: RPR: NONREACTIVE

## 2013-03-05 LAB — OB RESULTS CONSOLE RUBELLA ANTIBODY, IGM: Rubella: IMMUNE

## 2013-03-05 LAB — OB RESULTS CONSOLE ABO/RH: RH Type: POSITIVE

## 2013-03-05 LAB — OB RESULTS CONSOLE GC/CHLAMYDIA
Chlamydia: NEGATIVE
Gonorrhea: NEGATIVE

## 2013-03-05 LAB — OB RESULTS CONSOLE ANTIBODY SCREEN: Antibody Screen: NEGATIVE

## 2013-04-25 ENCOUNTER — Inpatient Hospital Stay (HOSPITAL_COMMUNITY)
Admission: AD | Admit: 2013-04-25 | Discharge: 2013-04-25 | Disposition: A | Discharge status: Home / Self Care | Payer: BC Managed Care – PPO | Source: Ambulatory Visit | Attending: Obstetrics and Gynecology | Admitting: Obstetrics and Gynecology

## 2013-04-25 ENCOUNTER — Encounter (HOSPITAL_COMMUNITY): Payer: Self-pay | Admitting: *Deleted

## 2013-04-25 DIAGNOSIS — E162 Hypoglycemia, unspecified: Secondary | ICD-10-CM | POA: Diagnosis not present

## 2013-04-25 DIAGNOSIS — R42 Dizziness and giddiness: Secondary | ICD-10-CM | POA: Diagnosis not present

## 2013-04-25 DIAGNOSIS — O99891 Other specified diseases and conditions complicating pregnancy: Secondary | ICD-10-CM | POA: Insufficient documentation

## 2013-04-25 DIAGNOSIS — O210 Mild hyperemesis gravidarum: Secondary | ICD-10-CM | POA: Insufficient documentation

## 2013-04-25 LAB — URINE MICROSCOPIC-ADD ON

## 2013-04-25 LAB — URINALYSIS, ROUTINE W REFLEX MICROSCOPIC
Leukocytes, UA: NEGATIVE
Protein, ur: NEGATIVE mg/dL
Urobilinogen, UA: 0.2 mg/dL (ref 0.0–1.0)

## 2013-04-25 MED ORDER — DOXYLAMINE-PYRIDOXINE 10-10 MG PO TBEC: 2.0000 | DELAYED_RELEASE_TABLET | Freq: Every day | ORAL | Status: DC

## 2013-04-25 NOTE — MAU Note (Signed)
Patient presents to MAU with c/o of increasing nausea, weakness and dizziness x 2 days. Denies pain at this time.

## 2013-04-25 NOTE — MAU Provider Note (Signed)
History   27yo G2P0 at [redacted]w[redacted]d presents unannounced for dizziness and nausea.  Nausea has been persistent since the beginning of the pregnancy.  The dizziness began within the last couple of days.  Reports that she has not been eating well or staying hydrated, d/t many food aversions at this time.  Denies VB, recent fever, resp or GI c/o's, UTI s/s.  Chief Complaint  Patient presents with  . Nausea  . Dizziness  . Fatigue    OB History   Grav Para Term Preterm Abortions TAB SAB Ect Mult Living   2    1  1          Past Medical History  Diagnosis Date  . 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 HSV infection   . Asthma   . Lupus   . Depression   . Hypertension     Past Surgical History  Procedure Laterality Date  . Tonsillectomy and adenoidectomy  1998  . Mouth surgery      2 teeth removed- 1 wisdom and 1 in front of it    Family History  Problem Relation Age of Onset  . Diabetes Maternal Aunt   . Cancer Maternal Grandmother 72    COLON CA  . Diabetes Maternal Grandmother   . Hypertension Maternal Grandfather   . Asthma Mother   . Asthma Brother   . Anesthesia problems Neg Hx   . Hypotension Neg Hx   . Malignant hyperthermia Neg Hx   . Pseudochol deficiency Neg Hx     History  Substance Use Topics  . Smoking status: Never Smoker   . Smokeless tobacco: Never Used  . Alcohol Use: No    Allergies:  Allergies  Allergen Reactions  . Bactrim Anaphylaxis and Hives  . Prednisone Anaphylaxis and Hives  . Other Other (See Comments)    Shrimp:throat itching "can eat other shellfish"  . Pineapple Itching    Prescriptions prior to admission  Medication Sig Dispense Refill  . bisacodyl (DULCOLAX) 5 MG EC tablet Take 5 mg by mouth daily as needed for constipation.      . calcium carbonate (TUMS - DOSED IN MG ELEMENTAL CALCIUM) 500 MG chewable tablet Chew 2 tablets by mouth daily as needed for heartburn.      . diphenhydramine-acetaminophen  (TYLENOL PM) 25-500 MG TABS Take 0.5 tablets by mouth at bedtime as needed (For sleep.).      Marland Kitchen Menthol-Methyl Salicylate (ICY HOT EXTRA STRENGTH) 10-30 % CREA Apply 1 application topically daily as needed (For joint pain.).      Marland Kitchen Prenatal Vit-Fe Fumarate-FA (PRENATAL MULTIVITAMIN) TABS Take 1 tablet by mouth daily.      Marland Kitchen albuterol (PROVENTIL HFA;VENTOLIN HFA) 108 (90 BASE) MCG/ACT inhaler Inhale 2 puffs into the lungs every 6 (six) hours as needed. For shortness of breath         ROS: see HPI above, all other systems are negative  Physical Exam   Blood pressure 138/94, pulse 98, temperature 97.6 F (36.4 C), temperature source Oral, resp. rate 20, height 5' 8.5" (1.74 m), weight 317 lb 12.8 oz (144.153 kg), last menstrual period 01/20/2013, SpO2 100.00%.  Chest: Clear Heart: RRR Abdomen: gravid, NT Extremities: WNL  FHT: 140 doppler UCs: none  ED Course  IUP at [redacted]w[redacted]d Nausea of pregnancy Hypoglycemia Poor nutrition d/t food aversions  D/c home with extensive nutritional education, encouraging protein throughout the day to maintain a euglycemic state Diclegis rx Encouraged to call  if sx get worse F/u 10/10 at already scheduled NOB w/u   Haroldine Laws [name], MSN 04/25/2013 7:09 PM

## 2013-05-01 ENCOUNTER — Encounter (HOSPITAL_COMMUNITY): Payer: Self-pay | Admitting: Obstetrics and Gynecology

## 2013-05-09 ENCOUNTER — Ambulatory Visit (HOSPITAL_COMMUNITY)
Admission: RE | Admit: 2013-05-09 | Discharge: 2013-05-09 | Disposition: A | Discharge status: Home / Self Care | Payer: BC Managed Care – PPO | Source: Ambulatory Visit | Attending: Obstetrics and Gynecology | Admitting: Obstetrics and Gynecology

## 2013-05-09 ENCOUNTER — Encounter (HOSPITAL_COMMUNITY): Payer: Self-pay

## 2013-05-09 VITALS — BP 154/108 | HR 86 | Wt 317.8 lb

## 2013-05-09 DIAGNOSIS — M329 Systemic lupus erythematosus, unspecified: Secondary | ICD-10-CM | POA: Insufficient documentation

## 2013-05-09 DIAGNOSIS — E669 Obesity, unspecified: Secondary | ICD-10-CM | POA: Insufficient documentation

## 2013-05-09 DIAGNOSIS — O99891 Other specified diseases and conditions complicating pregnancy: Secondary | ICD-10-CM | POA: Insufficient documentation

## 2013-05-09 HISTORY — DX: Morbid (severe) obesity due to excess calories: E66.01

## 2013-05-09 NOTE — Addendum Note (Signed)
Encounter addended by: Molly Maduro, MD on: 05/09/2013  4:19 PM<BR>     Documentation filed: Notes Section

## 2013-05-09 NOTE — Progress Notes (Addendum)
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  The patient is a 27 y.o. G2P0010,at [redacted]w[redacted]d with an EDD of 11/03/2013.  [name] reports being diagnosed with SLE in 2011 after having her fingernail fall off and several hand arthralgias.  She mainly has flares with her hands and knees.  She reports having increased flare frequency in pregnancy with 2/week the most recent being yesterday.  She does report that the severity of her flares has been mild though and they have been manageable without taking any prescription medications.  She continues to be followed with rheumatology.  [name] is here today to discuss the implications of this and the positive SSA and SSB antibodies.   Review of Systems A comprehensive review of systems was negative.  Patient History OB History  Gravida Para Term Preterm AB SAB TAB Ectopic Multiple Living  2    1 1         # Outcome Date GA Lbr Len/2nd Weight Sex Delivery Anes PTL Lv  2 CUR           1 SAB               Past Medical History  Diagnosis Date  . 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 HSV infection   . Asthma   . Lupus   . Depression   . Hypertension     Past Surgical History  Procedure Laterality Date  . Tonsillectomy and adenoidectomy  1998  . Mouth surgery      2 teeth removed- 1 wisdom and 1 in front of it    History   Social History  . Marital Status: Single    Spouse Name: N/A    Number of Children: N/A  . Years of Education: N/A   Social History Main Topics  . Smoking status: Never  Smoker   . Smokeless tobacco: Never Used  . Alcohol Use: No  . Drug Use: No  . Sexual Activity: Yes    Partners: Male    Birth Control/ Protection: Condom   Other Topics Concern  . None   Social History Narrative  . None    Family History  Problem Relation Age of Onset  . Diabetes Maternal Aunt   . Cancer Maternal Grandmother 72    COLON CA  . Diabetes Maternal Grandmother   . Hypertension Maternal Grandfather   . Asthma Mother   . Asthma Brother   . Anesthesia problems Neg Hx   . Hypotension Neg Hx   . Malignant hyperthermia Neg Hx   . Pseudochol deficiency Neg Hx    In addition, the patient has no family history of mental retardation, birth defects, or genetic diseases.  Physical Examination Filed Vitals:   05/09/13 1342  BP: 154/108, 135/97  Pulse:    General appearance - alert, well appearing, and in no distress Mental status - alert, oriented to person, place, and time  Assessment and Recommendations 1. SLE- [name] was counseled that SLE in pregnancy can have a variable course.  She has no history renal  compromise (Creatinine WNL 5 months ago) which is a good prognostic indicator although 24 hour urine protein collection should be performed for baseline. The patient understands the risks of disease progression during pregnancy, risk of fetal heart block, and risk of neonatal Lupus. It was explained that patients with SLE have an increased risk of morbidity and a 20 fold increase in mortality during pregnancy compared to the general population. In addition Lupus patients are at increased risk for developing preeclampsia, growth restriction, and oligohydramnios and should be monitored very closely for development of any of these. Fetal testing in the form of twice weekly fetal non-stress test should be instituted at [redacted] weeks gestation until delivery in addition to serial ultrasound evaluation of fetal growth every 3-4 weeks.  2. +SSA/SSB antibodies- Anti-SSA and SSB  (Ro and La) are associated with congenital heart block and she will require frequent evaluation and possible steroid administration during the pregnancy if fetal heart block develops.  ,She will need to be evaluated by fetal cardiology beginning at 16 weeks for fetal heart block and continue weekly until [redacted] weeks gestation with pediatric cardiology with interval fetal growth evaluation every 3-4 weeks with either her primary obgyn or our office. Neonatal lupus syndrome - Neonatal lupus is a passively transferred autoimmune disease that occurs in some babies born to mothers with anti-Ro and/or anti-La antibodies. The most serious complication in the neonate is complete heart block, which occurs in approximately 1 to 2 percent of such pregnancies and in 15 percent of subsequent pregnancies in a mother who has given birth to a child with congenital heart block. Neonatal lupus accounts for 90 to 95 percent of cases of congenital heart block occurring in utero or in the neonatal period.    3. Obesity- Obesity (BMI > 30) and overweight (25 < BMI < 30) afflicts over 67% of Americans and is the fastest growing health problem in the [place]. Obesity in pregnancy is associated with an increased risk of both early fetal loss and stillbirth. In a [employer], the risk of stillbirth was 1/200 for non-obese mothers and increases to 1/100 for morbid obesity (BMI > 40). The reason for this increased still-birth risk with advancing gestation is likely multifactorial, but associated with placental dysfunction. The obese gravida is also at increased risk for preeclampsia, gestational or overt diabetes, other endocrinopathies (hypothyroidism), anesthetic complications related to sleep apnea, psychiatric disease including depression, risk for dysfunctional labor leading to a higher Cesarean delivery rate, and increased risk for thromboembolic disease.  Therefore, we recommend: early glucola (this patient already had  this and passed with 117) thyroid function, preeclampsia "panel" with 24 hour urine, first trimester aneuploidy screening which was within normal limits in this patient. Ultrasound evaluation for growth surveillance with NST or BPP beginning at [redacted] weeks gestation. Serial compression devices during labor/post-partum until ambulating (versus lovenox or heparin) and limited weight gain during gestation should also be recommended.   50 minutes was spent with the patient greater than 50% of which was face to face counseling discussing the above.   Molly Maduro, MD

## 2013-05-09 NOTE — ED Notes (Signed)
Dr. Casper Harrison aware of BP findings. No additional orders given.

## 2013-05-12 ENCOUNTER — Encounter (HOSPITAL_COMMUNITY): Payer: Self-pay | Admitting: *Deleted

## 2013-05-12 ENCOUNTER — Inpatient Hospital Stay (HOSPITAL_COMMUNITY)
Admission: AD | Admit: 2013-05-12 | Discharge: 2013-05-12 | Disposition: A | Discharge status: Home / Self Care | Payer: BC Managed Care – PPO | Source: Ambulatory Visit | Attending: Obstetrics and Gynecology | Admitting: Obstetrics and Gynecology

## 2013-05-12 DIAGNOSIS — R51 Headache: Secondary | ICD-10-CM | POA: Insufficient documentation

## 2013-05-12 DIAGNOSIS — O99891 Other specified diseases and conditions complicating pregnancy: Secondary | ICD-10-CM | POA: Insufficient documentation

## 2013-05-12 HISTORY — DX: Anxiety disorder, unspecified: F41.9

## 2013-05-12 HISTORY — DX: Headache: R51

## 2013-05-12 HISTORY — DX: Unspecified infectious disease: B99.9

## 2013-05-12 LAB — COMPREHENSIVE METABOLIC PANEL
Albumin: 3.3 g/dL — ABNORMAL LOW (ref 3.5–5.2)
BUN: 8 mg/dL (ref 6–23)
Chloride: 103 mEq/L (ref 96–112)
Creatinine, Ser: 0.55 mg/dL (ref 0.50–1.10)
Total Bilirubin: 0.2 mg/dL — ABNORMAL LOW (ref 0.3–1.2)
Total Protein: 7.1 g/dL (ref 6.0–8.3)

## 2013-05-12 LAB — URINALYSIS, ROUTINE W REFLEX MICROSCOPIC
Glucose, UA: NEGATIVE mg/dL
Leukocytes, UA: NEGATIVE
Nitrite: NEGATIVE
Protein, ur: NEGATIVE mg/dL
Urobilinogen, UA: 0.2 mg/dL (ref 0.0–1.0)

## 2013-05-12 LAB — CBC
HCT: 36.8 % (ref 36.0–46.0)
MCHC: 33.7 g/dL (ref 30.0–36.0)
MCV: 83.8 fL (ref 78.0–100.0)
RDW: 14.1 % (ref 11.5–15.5)

## 2013-05-12 MED ORDER — ACETAMINOPHEN 325 MG PO TABS
650.0000 mg | ORAL_TABLET | Freq: Once | ORAL | Status: AC
  Administered 2013-05-12: 650 mg via ORAL
  Filled 2013-05-12: qty 2

## 2013-05-12 MED ORDER — OXYCODONE-ACETAMINOPHEN 5-325 MG PO TABS: 1.0000 | ORAL_TABLET | Freq: Once | ORAL | Status: DC

## 2013-05-12 MED ORDER — PROMETHAZINE HCL 25 MG PO TABS: 25.0000 mg | ORAL_TABLET | Freq: Once | ORAL | Status: DC

## 2013-05-12 MED ORDER — ONDANSETRON HCL 4 MG PO TABS: 4.0000 mg | ORAL_TABLET | Freq: Three times a day (TID) | ORAL | Status: DC | PRN

## 2013-05-12 NOTE — MAU Provider Note (Signed)
History     CSN: [id number]  Arrival date and time: 05/12/13 1753   None     Chief Complaint  Patient presents with  . Headache  . Dizziness  . Blurred Vision   HPI Comments: Hayley Webb 27 y.o. [id number] presents to MAU for headache that has been off and on for 3 weeks. She has pain on both side of her head, no photophobia, some nausea and dizziness. She has a history of anxiety and depression.      Patient is a 27 y.o. female presenting with headaches.  Headache The primary symptoms include headaches, dizziness, nausea and vomiting.  Dizziness also occurs with blurred vision, nausea and vomiting.      Past Medical History  Diagnosis Date  . 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 HSV infection   . Asthma   . Lupus     dx age 89  . Headache(784.0)   . Hypertension     hx of elevations, never been on meds  . Infection     UTI  . Anxiety     doing good now    Past Surgical History  Procedure Laterality Date  . Tonsillectomy and adenoidectomy  1998  . Mouth surgery      2 teeth removed- 1 wisdom and 1 in front of it  . Wisdom tooth extraction      Family History  Problem Relation Age of Onset  . Diabetes Maternal Aunt   . Cancer Maternal Grandmother 72    COLON CA  . Diabetes Maternal Grandmother   . Hypertension Maternal Grandfather   . Cancer Maternal Grandfather     prostate  . Asthma Mother   . Asthma Brother   . Anesthesia problems Neg Hx   . Hypotension Neg Hx   . Malignant hyperthermia Neg Hx   . Pseudochol deficiency Neg Hx   . Cancer Paternal Grandmother     breast    History  Substance Use Topics  . Smoking status: Never Smoker   . Smokeless tobacco: Never Used  . Alcohol Use: Yes     Comment: not with preg    Allergies:  Allergies  Allergen Reactions  . Bactrim Anaphylaxis and Hives  . Prednisone Anaphylaxis and Hives  . Other Other (See Comments)    Shrimp:throat itching "can eat other shellfish"   . Pineapple Itching    Prescriptions prior to admission  Medication Sig Dispense Refill  . albuterol (PROVENTIL HFA;VENTOLIN HFA) 108 (90 BASE) MCG/ACT inhaler Inhale 2 puffs into the lungs every 6 (six) hours as needed. For shortness of breath       . bisacodyl (DULCOLAX) 5 MG EC tablet Take 5 mg by mouth daily as needed for constipation.      . calcium carbonate (TUMS - DOSED IN MG ELEMENTAL CALCIUM) 500 MG chewable tablet Chew 2 tablets by mouth daily as needed for heartburn.      . diphenhydramine-acetaminophen (TYLENOL PM) 25-500 MG TABS Take 0.5 tablets by mouth at bedtime as needed (For sleep.).      Marland Kitchen Doxylamine-Pyridoxine (DICLEGIS) 10-10 MG TBEC Take 2 tablets by mouth at bedtime.  100 tablet  2  . Menthol-Methyl Salicylate (ICY HOT EXTRA STRENGTH) 10-30 % CREA Apply 1 application topically daily as needed (For joint pain.).      Marland Kitchen Prenatal Vit-Fe Fumarate-FA (PRENATAL MULTIVITAMIN) TABS Take 1 tablet by mouth daily.        Review of Systems  Constitutional: Negative.   Eyes: Positive for blurred vision.  Respiratory: Negative.   Cardiovascular: Negative.   Gastrointestinal: Positive for nausea and vomiting.  Skin: Negative.   Neurological: Positive for dizziness and headaches.  Psychiatric/Behavioral: The patient is nervous/anxious.    Physical Exam   Blood pressure 123/86, pulse 92, temperature 98.6 F (37 C), temperature source Oral, resp. rate 20, height 5\' 9"  (1.753 m), weight 315 lb 3.2 oz (142.974 kg), last menstrual period 01/20/2013, SpO2 100.00%.  Physical Exam  Constitutional: She is oriented to person, place, and time. She appears well-developed and well-nourished. No distress.  HENT:  Head: Normocephalic.  Cardiovascular: Normal rate, regular rhythm and normal heart sounds.   Respiratory: Effort normal and breath sounds normal. No respiratory distress. She has no wheezes. She has no rales. She exhibits no tenderness.  GI: Soft. Bowel sounds are normal.  There is no tenderness.  Genitourinary:  Not done  Musculoskeletal: Normal range of motion.  Neurological: She is alert and oriented to person, place, and time. She has normal reflexes.  Skin: Skin is warm and dry.  Psychiatric: Her mood appears anxious.   Results for orders placed during the hospital encounter of 05/12/13 (from the past 24 hour(s))  URINALYSIS, ROUTINE W REFLEX MICROSCOPIC     Status: None   Collection Time    05/12/13  6:00 PM      Result Value Range   Color, Urine YELLOW  YELLOW   APPearance CLEAR  CLEAR   Specific Gravity, Urine 1.020  1.005 - 1.030   pH 7.0  5.0 - 8.0   Glucose, UA NEGATIVE  NEGATIVE mg/dL   Hgb urine dipstick NEGATIVE  NEGATIVE   Bilirubin Urine NEGATIVE  NEGATIVE   Ketones, ur NEGATIVE  NEGATIVE mg/dL   Protein, ur NEGATIVE  NEGATIVE mg/dL   Urobilinogen, UA 0.2  0.0 - 1.0 mg/dL   Nitrite NEGATIVE  NEGATIVE   Leukocytes, UA NEGATIVE  NEGATIVE  CBC     Status: None   Collection Time    05/12/13  6:04 PM      Result Value Range   WBC 5.9  4.0 - 10.5 K/uL   RBC 4.39  3.87 - 5.11 MIL/uL   Hemoglobin 12.4  12.0 - 15.0 g/dL   HCT 21.3  08.6 - 57.8 %   MCV 83.8  78.0 - 100.0 fL   MCH 28.2  26.0 - 34.0 pg   MCHC 33.7  30.0 - 36.0 g/dL   RDW 46.9  62.9 - 52.8 %   Platelets 288  150 - 400 K/uL  COMPREHENSIVE METABOLIC PANEL     Status: Abnormal   Collection Time    05/12/13  6:04 PM      Result Value Range   Sodium 132 (*) 135 - 145 mEq/L   Potassium 3.7  3.5 - 5.1 mEq/L   Chloride 103  96 - 112 mEq/L   CO2 20  19 - 32 mEq/L   Glucose, Bld 90  70 - 99 mg/dL   BUN 8  6 - 23 mg/dL   Creatinine, Ser 4.13  0.50 - 1.10 mg/dL   Calcium 9.5  8.4 - 24.4 mg/dL   Total Protein 7.1  6.0 - 8.3 g/dL   Albumin 3.3 (*) 3.5 - 5.2 g/dL   AST 15  0 - 37 U/L   ALT 14  0 - 35 U/L   Alkaline Phosphatase 68  39 - 117 U/L   Total Bilirubin 0.2 (*) 0.3 -  1.2 mg/dL   GFR calc non Af Amer >90  >90 mL/min   GFR calc Af Amer >90  >90 mL/min      MAU  Course  Procedures  MDM  Passed care to Haroldine Laws, CNMW at 8pm We discussed her being placed on an SSRI for anxiety/ depression  Assessment and Plan    Carolynn Serve 05/12/2013, 7:47 PM

## 2013-05-12 NOTE — MAU Note (Signed)
Patient states she started having dizziness, headache and blurred vision this am when she woke up this am. Micah Flesher to work but has not gotten better, usually gets better after eating. Denies bleeding or leaking or discharge.

## 2013-05-12 NOTE — MAU Note (Signed)
Increase in headache occurrence last few weeks, only took tylenol once .  Did quit caffeine with pregnancy.

## 2013-05-12 NOTE — MAU Provider Note (Signed)
History   27yo, G2P0010 at [redacted]w[redacted]d who presents with a mild HA behind "her checks and eyes" for the last few weeks.  Has not taken medication for these HAs.  Also reports some mild blurry vision.  Denies VB, UCs, LOF, recent fever, resp or GI c/o's, UTI s/s.  Chief Complaint  Patient presents with  . Headache  . Dizziness  . Blurred Vision   The history is provided by the patient.    OB History   Grav Para Term Preterm Abortions TAB SAB Ect Mult Living   2    1  1          Past Medical History  Diagnosis Date  . 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 HSV infection   . Asthma   . Lupus     dx age 74  . Headache(784.0)   . Hypertension     hx of elevations, never been on meds  . Infection     UTI  . Anxiety     doing good now    Past Surgical History  Procedure Laterality Date  . Tonsillectomy and adenoidectomy  1998  . Mouth surgery      2 teeth removed- 1 wisdom and 1 in front of it  . Wisdom tooth extraction      Family History  Problem Relation Age of Onset  . Diabetes Maternal Aunt   . Cancer Maternal Grandmother 72    COLON CA  . Diabetes Maternal Grandmother   . Hypertension Maternal Grandfather   . Cancer Maternal Grandfather     prostate  . Asthma Mother   . Asthma Brother   . Anesthesia problems Neg Hx   . Hypotension Neg Hx   . Malignant hyperthermia Neg Hx   . Pseudochol deficiency Neg Hx   . Cancer Paternal Grandmother     breast    History  Substance Use Topics  . Smoking status: Never Smoker   . Smokeless tobacco: Never Used  . Alcohol Use: Yes     Comment: not with preg    Allergies:  Allergies  Allergen Reactions  . Bactrim Anaphylaxis and Hives  . Prednisone Anaphylaxis and Hives  . Other Other (See Comments)    Shrimp:throat itching "can eat other shellfish"  . Pineapple Itching    Prescriptions prior to admission  Medication Sig Dispense Refill  . albuterol (PROVENTIL HFA;VENTOLIN HFA) 108 (90  BASE) MCG/ACT inhaler Inhale 2 puffs into the lungs every 6 (six) hours as needed. For shortness of breath       . bisacodyl (DULCOLAX) 5 MG EC tablet Take 5 mg by mouth daily as needed for constipation.      . calcium carbonate (TUMS - DOSED IN MG ELEMENTAL CALCIUM) 500 MG chewable tablet Chew 2 tablets by mouth daily as needed for heartburn.      . diphenhydramine-acetaminophen (TYLENOL PM) 25-500 MG TABS Take 0.5 tablets by mouth at bedtime as needed (For sleep.).      Marland Kitchen Doxylamine-Pyridoxine (DICLEGIS) 10-10 MG TBEC Take 2 tablets by mouth at bedtime.  100 tablet  2  . Menthol-Methyl Salicylate (ICY HOT EXTRA STRENGTH) 10-30 % CREA Apply 1 application topically daily as needed (For joint pain.).      Marland Kitchen Prenatal Vit-Fe Fumarate-FA (PRENATAL MULTIVITAMIN) TABS Take 1 tablet by mouth daily.        ROS: see HPI above, all other systems are negative  Physical Exam   Blood pressure 123/86,  pulse 92, temperature 98.6 F (37 C), temperature source Oral, resp. rate 20, height 5\' 9"  (1.753 m), weight 315 lb 3.2 oz (142.974 kg), last menstrual period 01/20/2013, SpO2 100.00%.  Chest: Clear Heart: RRR Abdomen: gravid, NT Extremities: WNL  Pelvic: Deferred  FHT: Doppler 150 UCs: none  ED Course  IUP at [redacted]w[redacted]d HA likely d/t allergies  Tylenol 650mg  once in MAU D/C home with precautions Encouraged pt to take OTC allergy medication F/u 11/6 for ROB in the office   Haroldine Laws [name], MSN 05/12/2013 7:44 PM

## 2013-05-27 NOTE — Addendum Note (Signed)
Encounter addended by: Alessandra Bevels. Chase Picket, RN on: 05/27/2013 10:54 AM<BR>     Documentation filed: Charges VN

## 2013-05-29 ENCOUNTER — Other Ambulatory Visit: Payer: Self-pay

## 2013-05-30 ENCOUNTER — Other Ambulatory Visit: Payer: Self-pay

## 2013-05-30 DIAGNOSIS — M329 Systemic lupus erythematosus, unspecified: Secondary | ICD-10-CM | POA: Insufficient documentation

## 2013-06-05 ENCOUNTER — Other Ambulatory Visit: Payer: Self-pay | Admitting: *Deleted

## 2013-06-05 ENCOUNTER — Ambulatory Visit (HOSPITAL_COMMUNITY)
Admission: RE | Admit: 2013-06-05 | Discharge: 2013-06-05 | Disposition: A | Discharge status: Home / Self Care | Payer: BC Managed Care – PPO | Source: Ambulatory Visit | Attending: Cardiovascular Disease | Admitting: Cardiovascular Disease

## 2013-06-05 DIAGNOSIS — M79609 Pain in unspecified limb: Secondary | ICD-10-CM

## 2013-06-05 DIAGNOSIS — M79605 Pain in left leg: Secondary | ICD-10-CM

## 2013-06-05 DIAGNOSIS — I82402 Acute embolism and thrombosis of unspecified deep veins of left lower extremity: Secondary | ICD-10-CM

## 2013-06-05 NOTE — Progress Notes (Signed)
Left Lower Extremity Venous Duplex Completed. Negative for DVT and SVT. °Brianna L Mazza,RVT °

## 2013-07-01 ENCOUNTER — Other Ambulatory Visit: Payer: Self-pay

## 2013-07-01 ENCOUNTER — Ambulatory Visit (HOSPITAL_COMMUNITY)
Admission: RE | Admit: 2013-07-01 | Discharge: 2013-07-01 | Disposition: A | Discharge status: Home / Self Care | Payer: BC Managed Care – PPO | Source: Ambulatory Visit | Attending: Obstetrics and Gynecology | Admitting: Obstetrics and Gynecology

## 2013-07-01 DIAGNOSIS — M329 Systemic lupus erythematosus, unspecified: Secondary | ICD-10-CM | POA: Insufficient documentation

## 2013-07-01 DIAGNOSIS — O99891 Other specified diseases and conditions complicating pregnancy: Secondary | ICD-10-CM | POA: Insufficient documentation

## 2013-07-01 NOTE — Progress Notes (Signed)
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  The patient is a 27 y.o. G2P0010,at [redacted]w[redacted]d with an EDD of 11/03/2013, Alternate EDD Entry dating method.  Hayley Webb was seen here at the [facility] for a consult with Dr. Casper Harrison regarding her SLE and the implications of the SS/A and SS/B antibodies on 05/09/13.  For full details of that consult please see the accompanying note in [facility].  Hayley Webb returns today after a recent visit with Rheumatology where she reported progressively worsening joint pain in her left leg and both upper extremities and was told some of her antibodies levels were more elevated.  She reports that she was told that hydroxychloroquine, which she has taken with good results in the past, was not safe to use in pregnancy.  She also reports she was told to take prednisone even though she is allergic to that medication.  She returns again for another consult to discuss her recent test result and to see what medications that are safe in pregnancy she can take for relief of her joint pain/  Review of Systems Pertinent items are noted in HPI.  Patient History OB History  Gravida Para Term Preterm AB SAB TAB Ectopic Multiple Living  2    1 1         # Outcome Date GA Lbr Len/2nd Weight Sex Delivery Anes PTL Lv  2 CUR           1 SAB               Past Medical History  Diagnosis Date  . 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 HSV infection   . Asthma   . Lupus     dx age 64  . Headache(784.0)   . Hypertension     hx of elevations, never been on meds  . Infection     UTI  .  Anxiety     doing good now    Past Surgical History  Procedure Laterality Date  . Tonsillectomy and adenoidectomy  1998  . Mouth surgery      2 teeth removed- 1 wisdom and 1 in front of it  . Wisdom tooth extraction      History   Social History  . Marital Status: Single    Spouse Name: N/A    Number of Children: N/A  . Years of Education: N/A   Social History Main Topics  . Smoking status: Never Smoker   . Smokeless tobacco: Never Used  . Alcohol Use: Yes     Comment: not with preg  . Drug Use: No  . Sexual Activity: Yes    Partners: Male    Birth Control/ Protection: Condom   Other Topics Concern  . Not on file   Social History Narrative  . No narrative on file    Family History  Problem Relation Age of Onset  . Diabetes Maternal Aunt   . Cancer Maternal Grandmother 72    COLON CA  . Diabetes Maternal Grandmother   . Hypertension Maternal Grandfather   . Cancer Maternal Grandfather  prostate  . Asthma Mother   . Asthma Brother   . Anesthesia problems Neg Hx   . Hypotension Neg Hx   . Malignant hyperthermia Neg Hx   . Pseudochol deficiency Neg Hx   . Cancer Paternal Grandmother     breast   In addition, the patient has no family history of mental retardation, birth defects, or genetic diseases.  Assessment and Recommendations 1.  SLE.  Review of Hayley Webb's recent lab works shows a persistent elevation of SS/A and SS/B but the remainder of her autoantibodies, including anti-dsDNA are negative.  However, her CRP is highly elevated.  Atlhough this is a non-specific marker it does suggest active inflammation.  Her anti-dsDNA is normal which often more specifically correlates with lupus activity.  I have ordered a C3 and C4 level which are also known to be more specific markers of lupus activity.  I recommend that she restart hydroxychloroquine as it is safe to use in pregnancy.  I would hold off on starting steroids, reserving their use if  hydroxychloroquine fails to control her joint pain.  If her C3 and C4 levels are decreased they can be used to monitor her disease activity.  I recommend repeating these approximately one month after starting hydroxychloroquine to see if therapy restores the vales to normal.  Moving forward her pregnancy should be managed as described in Dr. Timothy Lasso consult note from 05/09/13. 2. SS/A and SS/B antibodies.  The persistence of these antibodies will not alter clinical management, as they are often present from extending periods of time and in some cases indefinitely.  Hayley Webb has started fetal echo surveillance for congenital heart block and should continue to do so.    I spent 30 minutes with Hayley Webb today of which 50% was face-to-face counseling.  Thank you for referring Hayley Webb to the [facility].  We are happy to see her at any time if desired, simply call to schedule a follow up visit.  Do not hesitate to contact us with questions.   Rema Fendt, MD

## 2013-07-01 NOTE — Addendum Note (Signed)
Encounter addended by: Alessandra Bevels. Chase Picket, RN on: 07/01/2013 12:40 PM<BR>     Documentation filed: Charges VN, Episodes, Chief Complaint Section

## 2013-07-02 ENCOUNTER — Emergency Department (HOSPITAL_BASED_OUTPATIENT_CLINIC_OR_DEPARTMENT_OTHER)
Admission: EM | Admit: 2013-07-02 | Discharge: 2013-07-02 | Disposition: A | Discharge status: Home / Self Care | Payer: BC Managed Care – PPO | Attending: Emergency Medicine | Admitting: Emergency Medicine

## 2013-07-02 ENCOUNTER — Encounter (HOSPITAL_BASED_OUTPATIENT_CLINIC_OR_DEPARTMENT_OTHER): Payer: Self-pay | Admitting: Emergency Medicine

## 2013-07-02 DIAGNOSIS — Z8739 Personal history of other diseases of the musculoskeletal system and connective tissue: Secondary | ICD-10-CM | POA: Insufficient documentation

## 2013-07-02 DIAGNOSIS — O210 Mild hyperemesis gravidarum: Secondary | ICD-10-CM | POA: Insufficient documentation

## 2013-07-02 DIAGNOSIS — O169 Unspecified maternal hypertension, unspecified trimester: Secondary | ICD-10-CM | POA: Insufficient documentation

## 2013-07-02 DIAGNOSIS — Z87440 Personal history of urinary (tract) infections: Secondary | ICD-10-CM | POA: Insufficient documentation

## 2013-07-02 DIAGNOSIS — Z8659 Personal history of other mental and behavioral disorders: Secondary | ICD-10-CM | POA: Insufficient documentation

## 2013-07-02 DIAGNOSIS — R05 Cough: Secondary | ICD-10-CM

## 2013-07-02 DIAGNOSIS — Z8619 Personal history of other infectious and parasitic diseases: Secondary | ICD-10-CM | POA: Insufficient documentation

## 2013-07-02 DIAGNOSIS — J45909 Unspecified asthma, uncomplicated: Secondary | ICD-10-CM | POA: Insufficient documentation

## 2013-07-02 DIAGNOSIS — R109 Unspecified abdominal pain: Secondary | ICD-10-CM | POA: Insufficient documentation

## 2013-07-02 DIAGNOSIS — Z79899 Other long term (current) drug therapy: Secondary | ICD-10-CM | POA: Insufficient documentation

## 2013-07-02 DIAGNOSIS — O219 Vomiting of pregnancy, unspecified: Secondary | ICD-10-CM

## 2013-07-02 DIAGNOSIS — R059 Cough, unspecified: Secondary | ICD-10-CM

## 2013-07-02 LAB — URINALYSIS, ROUTINE W REFLEX MICROSCOPIC
Glucose, UA: NEGATIVE mg/dL
Hgb urine dipstick: NEGATIVE
Leukocytes, UA: NEGATIVE
Nitrite: NEGATIVE
Protein, ur: NEGATIVE mg/dL
Specific Gravity, Urine: 1.024 (ref 1.005–1.030)

## 2013-07-02 MED ORDER — GUAIFENESIN-DM 100-10 MG/5ML PO SYRP: 5.0000 mL | ORAL_SOLUTION | ORAL | Status: DC | PRN

## 2013-07-02 MED ORDER — ALBUTEROL SULFATE HFA 108 (90 BASE) MCG/ACT IN AERS: 1.0000 | INHALATION_SPRAY | Freq: Four times a day (QID) | RESPIRATORY_TRACT | Status: DC | PRN

## 2013-07-02 MED ORDER — ONDANSETRON 4 MG PO TBDP
4.0000 mg | ORAL_TABLET | Freq: Once | ORAL | Status: DC
  Filled 2013-07-02: qty 1

## 2013-07-02 NOTE — ED Provider Notes (Signed)
CSN: [id number]     Arrival date & time 07/02/13  1734 History   First MD Initiated Contact with Patient 07/02/13 1743     Chief Complaint  Patient presents with  . Emesis    HPI  Patient presents with concern of one episode of emesis that was posttussive, and ongoing cough. Patient is [redacted] weeks pregnant. She states that the pregnancy has been unremarkable thus far. Patient had ultrasound today, just prior to emergency evaluation.  An ultrasound was performed due to the patient's history of lupus, for fetal cardiac evaluation. Patient states that today she has had minor cough, 2 episodes of production of green sputum. Following one of these episodes the patient had an episode of emesis. She denies ongoing nausea, any dyspnea, chest pain, lightheadedness, syncope, fever, chills. The cough has been present for several days. No attempts at relief with anything.  Past Medical History  Diagnosis Date  . 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 HSV infection   . Asthma   . Lupus     dx age 37  . Headache(784.0)   . Hypertension     hx of elevations, never been on meds  . Infection     UTI  . Anxiety     doing good now   Past Surgical History  Procedure Laterality Date  . Tonsillectomy and adenoidectomy  1998  . Mouth surgery      2 teeth removed- 1 wisdom and 1 in front of it  . Wisdom tooth extraction     Family History  Problem Relation Age of Onset  . Diabetes Maternal Aunt   . Cancer Maternal Grandmother 72    COLON CA  . Diabetes Maternal Grandmother   . Hypertension Maternal Grandfather   . Cancer Maternal Grandfather     prostate  . Asthma Mother   . Asthma Brother   . Anesthesia problems Neg Hx   . Hypotension Neg Hx   . Malignant hyperthermia Neg Hx   . Pseudochol deficiency Neg Hx   . Cancer Paternal Grandmother     breast   History  Substance Use Topics  . Smoking status: Never Smoker   . Smokeless tobacco: Never Used  . Alcohol  Use: Yes     Comment: not with preg   OB History   Grav Para Term Preterm Abortions TAB SAB Ect Mult Living   2    1  1         Review of Systems  Constitutional:       Per HPI, otherwise negative  HENT:       Per HPI, otherwise negative  Respiratory: Positive for cough. Negative for chest tightness, shortness of breath and wheezing.   Cardiovascular:       Per HPI, otherwise negative  Gastrointestinal: Positive for nausea, vomiting and abdominal pain. Negative for diarrhea.  Endocrine:       Negative aside from HPI  Genitourinary:       Neg aside from HPI   Musculoskeletal:       Per HPI, otherwise negative  Skin: Negative.   Neurological: Negative for syncope.    Allergies  Bactrim; Prednisone; Other; and Pineapple  Home Medications   Current Outpatient Rx  Name  Route  Sig  Dispense  Refill  . acetaminophen (TYLENOL) 325 MG tablet   Oral   Take 650 mg by mouth every 6 (six) hours as needed for pain (For headache.).         Marland Kitchen  albuterol (PROVENTIL HFA;VENTOLIN HFA) 108 (90 BASE) MCG/ACT inhaler   Inhalation   Inhale 1-2 puffs into the lungs every 6 (six) hours as needed. For shortness of breath   3.7 g   2   . bisacodyl (DULCOLAX) 5 MG EC tablet   Oral   Take 5 mg by mouth daily as needed for constipation.         . calcium carbonate (TUMS - DOSED IN MG ELEMENTAL CALCIUM) 500 MG chewable tablet   Oral   Chew 2 tablets by mouth daily as needed for heartburn.         Marland Kitchen guaiFENesin-dextromethorphan (ROBITUSSIN DM) 100-10 MG/5ML syrup   Oral   Take 5 mLs by mouth every 4 (four) hours as needed for cough.   118 mL   0   . Menthol-Methyl Salicylate (ICY HOT EXTRA STRENGTH) 10-30 % CREA   Apply externally   Apply 1 application topically daily as needed (For joint pain.).         Marland Kitchen ondansetron (ZOFRAN) 4 MG tablet   Oral   Take 1 tablet (4 mg total) by mouth every 8 (eight) hours as needed for nausea.   20 tablet   0   . Prenatal Vit-Fe Fumarate-FA  (PRENATAL MULTIVITAMIN) TABS   Oral   Take 1 tablet by mouth daily.          BP 133/85  Pulse 93  Temp(Src) 97.9 F (36.6 C) (Oral)  Resp 18  Ht 5\' 9"  (1.753 m)  Wt 316 lb (143.337 kg)  BMI 46.64 kg/m2  SpO2 100%  LMP 01/20/2013 Physical Exam  Nursing note and vitals reviewed. Constitutional: She is oriented to person, place, and time. She appears well-developed and well-nourished. No distress.  HENT:  Head: Normocephalic and atraumatic.  Eyes: Conjunctivae and EOM are normal.  Cardiovascular: Normal rate and regular rhythm.   Pulmonary/Chest: Effort normal and breath sounds normal. No stridor. No respiratory distress.  Abdominal: She exhibits no distension.  soft, non-tender gravid abdomen  Musculoskeletal: She exhibits no edema.  Neurological: She is alert and oriented to person, place, and time. No cranial nerve deficit.  Skin: Skin is warm and dry.  Psychiatric: She has a normal mood and affect.    ED Course  Procedures (including critical care time) Labs Review Labs Reviewed  URINALYSIS, ROUTINE W REFLEX MICROSCOPIC - Abnormal; Notable for the following:    Color, Urine AMBER (*)    Bilirubin Urine SMALL (*)    Ketones, ur >80 (*)    All other components within normal limits   Imaging Review No results found.  EKG Interpretation   None      Following return of the patient's urinalysis discussed the findings with her and her mother.  Patient is intolerant of oral hydration, and with no ongoing nausea, nor any vomiting, she will have oral rehydration, IV fluid resuscitation not required. We discussed the need for next OB followup. We had a lengthy conversation on the absence of clinical signs of pneumonia, which is the patient's primary concern.   MDM   1. Cough   2. Nausea and vomiting in pregnancy    This [redacted] weeks pregnant female presents with concerns of cough, one episode of emesis.  On exam she is awake and alert, in no distress, hemodynamically  stable, with no hypoxia, tachycardia, tachypnea.  There is no clinical evidence of pneumonia.  Patient had ultrasound that just prior to my evaluation which is reassuring for fetal status. With  ketonuria, I encouraged fluid resuscitation at home, next a followup period with no evidence of distress, no nausea or vomiting, there is low suspicion for imminent decompensation.  All results were discussed the patient and her mother prior to discharge.    Gerhard Munch, MD 07/02/13 Ernestina [place]

## 2013-07-02 NOTE — ED Notes (Signed)
Pt reports that she is [redacted] weeks pregnant.  Reports that she has had chest and nasal congestion that started today after waking up from a nap.  Reports vomiting x 2 with no nausea and no pain today.  No distress noted.

## 2013-07-07 ENCOUNTER — Inpatient Hospital Stay (HOSPITAL_COMMUNITY)
Admission: AD | Admit: 2013-07-07 | Discharge: 2013-07-07 | Disposition: A | Discharge status: Home / Self Care | Payer: BC Managed Care – PPO | Source: Ambulatory Visit | Attending: Obstetrics and Gynecology | Admitting: Obstetrics and Gynecology

## 2013-07-07 ENCOUNTER — Encounter (HOSPITAL_COMMUNITY): Payer: Self-pay | Admitting: *Deleted

## 2013-07-07 DIAGNOSIS — R1032 Left lower quadrant pain: Secondary | ICD-10-CM | POA: Insufficient documentation

## 2013-07-07 DIAGNOSIS — O99891 Other specified diseases and conditions complicating pregnancy: Secondary | ICD-10-CM | POA: Insufficient documentation

## 2013-07-07 DIAGNOSIS — R300 Dysuria: Secondary | ICD-10-CM | POA: Insufficient documentation

## 2013-07-07 DIAGNOSIS — M545 Low back pain, unspecified: Secondary | ICD-10-CM | POA: Insufficient documentation

## 2013-07-07 LAB — URINALYSIS, ROUTINE W REFLEX MICROSCOPIC
Glucose, UA: NEGATIVE mg/dL
Ketones, ur: NEGATIVE mg/dL
Leukocytes, UA: NEGATIVE
Nitrite: NEGATIVE
Protein, ur: NEGATIVE mg/dL
Urobilinogen, UA: 0.2 mg/dL (ref 0.0–1.0)
pH: 6 (ref 5.0–8.0)

## 2013-07-07 MED ORDER — IBUPROFEN 600 MG PO TABS
600.0000 mg | ORAL_TABLET | Freq: Once | ORAL | Status: AC
  Administered 2013-07-07: 600 mg via ORAL
  Filled 2013-07-07: qty 1

## 2013-07-07 NOTE — MAU Note (Signed)
Pt reports sharp pains left lower abd and left lower back x 3 hours, denies bleeding. Pain with urination.

## 2013-07-07 NOTE — MAU Provider Note (Signed)
History     CSN: [id number]  Arrival date and time: 07/07/13 0211   None     Chief Complaint  Patient presents with  . Dysuria  . Abdominal Pain   HPI Comments: Pt is a G2P0 at 23wks arrives after calling [name] on call c/o LLQ pain that was initially intermittent, now feels constant. Pt denies ctx, VB or LOF. Reports +FM.   Dysuria   Abdominal Pain Associated symptoms include dysuria.      Past Medical History  Diagnosis Date  . 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 HSV infection   . Asthma   . Lupus     dx age 71  . Headache(784.0)   . Hypertension     hx of elevations, never been on meds  . Infection     UTI  . Anxiety     doing good now    Past Surgical History  Procedure Laterality Date  . Tonsillectomy and adenoidectomy  1998  . Mouth surgery      2 teeth removed- 1 wisdom and 1 in front of it  . Wisdom tooth extraction      Family History  Problem Relation Age of Onset  . Diabetes Maternal Aunt   . Cancer Maternal Grandmother 72    COLON CA  . Diabetes Maternal Grandmother   . Hypertension Maternal Grandfather   . Cancer Maternal Grandfather     prostate  . Asthma Mother   . Asthma Brother   . Anesthesia problems Neg Hx   . Hypotension Neg Hx   . Malignant hyperthermia Neg Hx   . Pseudochol deficiency Neg Hx   . Cancer Paternal Grandmother     breast    History  Substance Use Topics  . Smoking status: Never Smoker   . Smokeless tobacco: Never Used  . Alcohol Use: Yes     Comment: not with preg    Allergies:  Allergies  Allergen Reactions  . Bactrim Anaphylaxis and Hives  . Prednisone Anaphylaxis and Hives  . Other Other (See Comments)    Shrimp:throat itching "can eat other shellfish"  . Pineapple Itching    Prescriptions prior to admission  Medication Sig Dispense Refill  . acetaminophen (TYLENOL) 325 MG tablet Take 650 mg by mouth every 6 (six) hours as needed for pain (For headache.).      Marland Kitchen  Menthol-Methyl Salicylate (ICY HOT EXTRA STRENGTH) 10-30 % CREA Apply 1 application topically daily as needed (For joint pain.).      Marland Kitchen Prenatal Vit-Fe Fumarate-FA (PRENATAL MULTIVITAMIN) TABS Take 1 tablet by mouth daily.      Marland Kitchen albuterol (PROVENTIL HFA;VENTOLIN HFA) 108 (90 BASE) MCG/ACT inhaler Inhale 1-2 puffs into the lungs every 6 (six) hours as needed. For shortness of breath  3.7 g  2  . bisacodyl (DULCOLAX) 5 MG EC tablet Take 5 mg by mouth daily as needed for constipation.      . calcium carbonate (TUMS - DOSED IN MG ELEMENTAL CALCIUM) 500 MG chewable tablet Chew 2 tablets by mouth daily as needed for heartburn.      Marland Kitchen guaiFENesin-dextromethorphan (ROBITUSSIN DM) 100-10 MG/5ML syrup Take 5 mLs by mouth every 4 (four) hours as needed for cough.  118 mL  0  . ondansetron (ZOFRAN) 4 MG tablet Take 1 tablet (4 mg total) by mouth every 8 (eight) hours as needed for nausea.  20 tablet  0    Review of Systems  Gastrointestinal: Positive  for abdominal pain.  Genitourinary: Positive for dysuria.  All other systems reviewed and are negative.   Physical Exam   Blood pressure 131/79, pulse 96, temperature 97.3 F (36.3 C), temperature source Oral, resp. rate 20, height 5\' 9"  (1.753 m), weight 316 lb (143.337 kg), last menstrual period 01/20/2013, SpO2 100.00%.  Physical Exam  Nursing note and vitals reviewed. Constitutional: She is oriented to person, place, and time. She appears well-developed and well-nourished.  HENT:  Head: Normocephalic.  Eyes: Pupils are equal, round, and reactive to light.  Neck: Normal range of motion.  Cardiovascular: Normal rate, regular rhythm and normal heart sounds.   Respiratory: Effort normal and breath sounds normal.  GI: Soft. Bowel sounds are normal.  Genitourinary: Vagina normal.  Musculoskeletal: Normal range of motion.  Neurological: She is alert and oriented to person, place, and time. She has normal reflexes.  Skin: Skin is warm and dry.   Psychiatric: She has a normal mood and affect. Her behavior is normal.    MAU Course  Procedures      Assessment and Plan  IUP at 23wks FHR reassuring for GA toco quiet UA WNL, will send for culture secondary to pt c/o dysuria  Will give Motrin 600mg  PO Check cervix   Tavin Vernet M 07/07/2013, 3:12 AM   Addendum: at 0330  Cervix cl/th/high Motrin PO given Likely RLP Will dc home if motrin helps  S.Sonu Kruckenberg, [name]   Addendum at 59  S: pt reports much improvement of pain after PO motrin  O: VSS  A: IUP at 23wks, round ligament pain, SLE,   P: dc home w instructions to take motrin 600mg  PO q6h prn, rv'd comfort measures for RLP rv'd PTL  Keep f/u as scheduled   S.Adilynne Fitzwater, [name]

## 2013-07-08 LAB — CULTURE, OB URINE: Culture: NO GROWTH

## 2013-07-10 DIAGNOSIS — M329 Systemic lupus erythematosus, unspecified: Secondary | ICD-10-CM | POA: Insufficient documentation

## 2013-07-12 ENCOUNTER — Encounter (HOSPITAL_COMMUNITY): Payer: Self-pay | Admitting: *Deleted

## 2013-07-12 ENCOUNTER — Inpatient Hospital Stay (HOSPITAL_COMMUNITY)
Admission: AD | Admit: 2013-07-12 | Discharge: 2013-07-13 | DRG: 778 | Disposition: A | Discharge status: Home / Self Care | Payer: BC Managed Care – PPO | Source: Ambulatory Visit | Attending: Obstetrics and Gynecology | Admitting: Obstetrics and Gynecology

## 2013-07-12 DIAGNOSIS — O4700 False labor before 37 completed weeks of gestation, unspecified trimester: Principal | ICD-10-CM | POA: Diagnosis present

## 2013-07-12 DIAGNOSIS — A64 Unspecified sexually transmitted disease: Secondary | ICD-10-CM

## 2013-07-12 DIAGNOSIS — O4702 False labor before 37 completed weeks of gestation, second trimester: Secondary | ICD-10-CM

## 2013-07-12 DIAGNOSIS — L680 Hirsutism: Secondary | ICD-10-CM

## 2013-07-12 DIAGNOSIS — M329 Systemic lupus erythematosus, unspecified: Secondary | ICD-10-CM | POA: Diagnosis present

## 2013-07-12 DIAGNOSIS — O479 False labor, unspecified: Secondary | ICD-10-CM | POA: Diagnosis present

## 2013-07-12 DIAGNOSIS — O358XX0 Maternal care for other (suspected) fetal abnormality and damage, not applicable or unspecified: Secondary | ICD-10-CM | POA: Diagnosis present

## 2013-07-12 DIAGNOSIS — R109 Unspecified abdominal pain: Secondary | ICD-10-CM | POA: Diagnosis present

## 2013-07-12 DIAGNOSIS — B009 Herpesviral infection, unspecified: Secondary | ICD-10-CM

## 2013-07-12 DIAGNOSIS — R635 Abnormal weight gain: Secondary | ICD-10-CM

## 2013-07-12 DIAGNOSIS — R102 Pelvic and perineal pain: Secondary | ICD-10-CM

## 2013-07-12 DIAGNOSIS — O99891 Other specified diseases and conditions complicating pregnancy: Secondary | ICD-10-CM | POA: Diagnosis present

## 2013-07-12 LAB — URINE MICROSCOPIC-ADD ON: WBC, UA: NONE SEEN WBC/hpf (ref <3)

## 2013-07-12 LAB — URINALYSIS, ROUTINE W REFLEX MICROSCOPIC
Glucose, UA: NEGATIVE mg/dL
Ketones, ur: NEGATIVE mg/dL
Leukocytes, UA: NEGATIVE
pH: 6 (ref 5.0–8.0)

## 2013-07-12 NOTE — MAU Note (Signed)
Pt reports she was at [facility] for the past 2 days to receive IV IG for her Lupus. Reports abd cramping increasing and noticed some pink spotting. Cervix was checke last night and it was closed had mild ctx through out the night but now are more intense.

## 2013-07-13 ENCOUNTER — Encounter (HOSPITAL_COMMUNITY): Payer: Self-pay | Admitting: Obstetrics and Gynecology

## 2013-07-13 ENCOUNTER — Inpatient Hospital Stay (HOSPITAL_COMMUNITY): Payer: BC Managed Care – PPO

## 2013-07-13 DIAGNOSIS — O4700 False labor before 37 completed weeks of gestation, unspecified trimester: Secondary | ICD-10-CM | POA: Diagnosis present

## 2013-07-13 DIAGNOSIS — O479 False labor, unspecified: Secondary | ICD-10-CM | POA: Diagnosis present

## 2013-07-13 LAB — WET PREP, GENITAL
Trich, Wet Prep: NONE SEEN
Yeast Wet Prep HPF POC: NONE SEEN

## 2013-07-13 MED ORDER — NIFEDIPINE 10 MG PO CAPS
20.0000 mg | ORAL_CAPSULE | Freq: Once | ORAL | Status: AC
  Administered 2013-07-13: 20 mg via ORAL
  Filled 2013-07-13: qty 2

## 2013-07-13 MED ORDER — NIFEDIPINE 10 MG PO CAPS
10.0000 mg | ORAL_CAPSULE | ORAL | Status: DC | PRN
Start: 2013-07-13 — End: 2013-07-13

## 2013-07-13 MED ORDER — PRENATAL MULTIVITAMIN CH
1.0000 | ORAL_TABLET | Freq: Every day | ORAL | Status: DC
  Administered 2013-07-13: 1 via ORAL
  Filled 2013-07-13: qty 1

## 2013-07-13 MED ORDER — NIFEDIPINE 10 MG PO CAPS
10.0000 mg | ORAL_CAPSULE | Freq: Four times a day (QID) | ORAL | Status: DC
  Administered 2013-07-13: 10 mg via ORAL
  Filled 2013-07-13: qty 1

## 2013-07-13 MED ORDER — HYDROXYCHLOROQUINE SULFATE 200 MG PO TABS
100.0000 mg | ORAL_TABLET | Freq: Every day | ORAL | Status: DC
  Administered 2013-07-13: 100 mg via ORAL
  Filled 2013-07-13: qty 0.5

## 2013-07-13 MED ORDER — LACTATED RINGERS IV SOLN: INTRAVENOUS | Status: DC

## 2013-07-13 MED ORDER — ACETAMINOPHEN 325 MG PO TABS
650.0000 mg | ORAL_TABLET | Freq: Four times a day (QID) | ORAL | Status: DC | PRN
  Administered 2013-07-13: 650 mg via ORAL
  Filled 2013-07-13: qty 2

## 2013-07-13 MED ORDER — ZOLPIDEM TARTRATE 5 MG PO TABS: 5.0000 mg | ORAL_TABLET | Freq: Every evening | ORAL | Status: DC | PRN

## 2013-07-13 MED ORDER — CALCIUM CARBONATE ANTACID 500 MG PO CHEW: 2.0000 | CHEWABLE_TABLET | Freq: Every day | ORAL | Status: DC | PRN

## 2013-07-13 MED ORDER — ALBUTEROL SULFATE HFA 108 (90 BASE) MCG/ACT IN AERS: 1.0000 | INHALATION_SPRAY | Freq: Four times a day (QID) | RESPIRATORY_TRACT | Status: DC | PRN

## 2013-07-13 MED ORDER — DOCUSATE SODIUM 100 MG PO CAPS
100.0000 mg | ORAL_CAPSULE | Freq: Every day | ORAL | Status: DC
  Filled 2013-07-13 (×2): qty 1

## 2013-07-13 MED ORDER — NIFEDIPINE 10 MG PO CAPS: 10.0000 mg | ORAL_CAPSULE | ORAL | Status: DC | PRN

## 2013-07-13 MED ORDER — DEXAMETHASONE 4 MG PO TABS
4.0000 mg | ORAL_TABLET | Freq: Every day | ORAL | Status: DC
  Administered 2013-07-13: 4 mg via ORAL
  Filled 2013-07-13: qty 1

## 2013-07-13 MED ORDER — ONDANSETRON HCL 4 MG PO TABS: 4.0000 mg | ORAL_TABLET | Freq: Three times a day (TID) | ORAL | Status: DC | PRN

## 2013-07-13 MED ORDER — LACTATED RINGERS IV SOLN
INTRAVENOUS | Status: DC
  Administered 2013-07-13 (×2): via INTRAVENOUS

## 2013-07-13 MED ORDER — NIFEDIPINE 10 MG PO CAPS
10.0000 mg | ORAL_CAPSULE | ORAL | Status: DC | PRN
  Administered 2013-07-13 (×2): 10 mg via ORAL
  Filled 2013-07-13 (×2): qty 1

## 2013-07-13 NOTE — Progress Notes (Signed)
Pt. Is stable and ready to be discharged home. All discharge instructions and prescriptions reviewed with the patient and all questions answered. Pt.'s mother at bedside to review discharge instructions as well. Pt. Will f/u with Dr. Estanislado Pandy in 2 days. All belongings with the patient. Pt. Wheeled out via wheelchair to her mother's car.

## 2013-07-13 NOTE — Progress Notes (Signed)
Utilization Review completed.  

## 2013-07-13 NOTE — Discharge Summary (Signed)
Physician Discharge Summary  Patient ID: Hayley Webb MRN: [id number] DOB/AGE: May 14, 1986 27 y.o.  Admit date: 07/12/2013 Discharge date: 07/13/2013  Admission Diagnoses: 1 preterm contractions 2 lupus 3 fetal heart block   Discharge Diagnoses: Same  Active Problems:   Preterm contractions   Discharged Condition: good  Hospital Course: pt presented at 23 wks and 6 days complaining of contractions. Cervix was closed on exam cervical length was 3.3 cm . Contractions resolved with iv fluids and procardia . Pt was discharged on on procardia and modified bedrest   Consults: None  Significant Diagnostic Studies: radiology: Ultrasound: per cnm cervical length 3.3 cm.   Treatments: IV hydration  Discharge Exam: Blood pressure 120/66, pulse 91, temperature 97.4 F (36.3 C), temperature source Oral, resp. rate 18, height 5\' 9"  (1.753 m), weight 137.893 kg (304 lb), last menstrual period 01/20/2013. General appearance: alert and cooperative GI: soft, non-tender; bowel sounds normal; no masses,  no organomegaly Extremities: extremities normal, atraumatic, no cyanosis or edema  Disposition: 01-Home or Self Care  Discharge Orders   Future Orders Complete By Expires   Discharge activity:  As directed    Comments:     Modified bedrest   Discharge diet:  No restrictions  As directed    Do not have sex or do anything that might make you have an orgasm  As directed    Fetal Kick Count:  Lie on our left side for one hour after a meal, and count the number of times your baby kicks.  If it is less than 5 times, get up, move around and drink some juice.  Repeat the test 30 minutes later.  If it is still less than 5 kicks in an hour, notify your doctor.  As directed    Notify physician for a general feeling that "something is not right"  As directed    Notify physician for increase or change in vaginal discharge  As directed    Notify physician for intestinal cramps, with or without diarrhea,  sometimes described as "gas pain"  As directed    Notify physician for leaking of fluid  As directed    Notify physician for low, dull backache, unrelieved by heat or Tylenol  As directed    Notify physician for menstrual like cramps  As directed    Notify physician for pelvic pressure  As directed    Notify physician for uterine contractions.  These may be painless and feel like the uterus is tightening or the baby is  "balling up"  As directed    Notify physician for vaginal bleeding  As directed    PRETERM LABOR:  Includes any of the follwing symptoms that occur between 20 - [redacted] weeks gestation.  If these symptoms are not stopped, preterm labor can result in preterm delivery, placing your baby at risk  As directed        Medication List         acetaminophen 325 MG tablet  Commonly known as:  TYLENOL  Take 650 mg by mouth every 6 (six) hours as needed for pain (For headache.).     albuterol 108 (90 BASE) MCG/ACT inhaler  Commonly known as:  PROVENTIL HFA;VENTOLIN HFA  Inhale 1-2 puffs into the lungs every 6 (six) hours as needed. For shortness of breath     calcium carbonate 500 MG chewable tablet  Commonly known as:  TUMS - dosed in mg elemental calcium  Chew 2 tablets by mouth daily as needed for heartburn.  dexamethasone 4 MG tablet  Commonly known as:  DECADRON  Take 4 mg by mouth daily.     guaiFENesin-dextromethorphan 100-10 MG/5ML syrup  Commonly known as:  ROBITUSSIN DM  Take 5 mLs by mouth every 4 (four) hours as needed for cough.     hydroxychloroquine 200 MG tablet  Commonly known as:  PLAQUENIL  Take 100 mg by mouth daily.     ICY HOT EXTRA STRENGTH 10-30 % Crea  Apply 1 application topically daily as needed (For joint pain.).     NIFEdipine 10 MG capsule  Commonly known as:  PROCARDIA  Take 1 capsule (10 mg total) by mouth every 4 (four) hours as needed (contractions).     prenatal multivitamin Tabs tablet  Take 1 tablet by mouth daily.            Follow-up Information   Follow up with [facility] A, MD. Schedule an appointment as soon as possible for a visit in 2 days.   Specialty:  Obstetrics and Gynecology   Contact information:   [address] [place] [address] [phone]       Signed: Jessee Avers. 07/13/2013, 11:42 AM

## 2013-07-13 NOTE — MAU Provider Note (Signed)
History   CSN: [id number]  Arrival date and time: 07/12/13 2242   None     Chief Complaint  Patient presents with  . Abdominal Cramping   HPI Pt presents to MAU at 23w 6d with c/o abdominal/pelvic cramping approximately every 5 mins over the past few hours.  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  Pt states that she had cramping in the early morning hours today and she was eval with SVE and treated with IV fluids.  She also reports having a small amt of vag spotting earlier this evening.  Denies recent intercourse.  Denies ROM.  Has noted vag discharge but denies itch/burn/odor.  Notes normal fetal movement pattern.  She contacted [facility] MFM prior to presenting and was told to come to [facility]    OB History   Grav Para Term Preterm Abortions TAB SAB Ect Mult Living   2    1  1          Past Medical History  Diagnosis Date  . 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 HSV infection   . Asthma   . Lupus     dx age 9  . Headache(784.0)   . Hypertension     hx of elevations, never been on meds  . Infection     UTI  . Anxiety     doing good now    Past Surgical History  Procedure Laterality Date  . Tonsillectomy and adenoidectomy  1998  . Mouth surgery      2 teeth removed- 1 wisdom and 1 in front of it  . Wisdom tooth extraction      Family History  Problem Relation Age of Onset  . Diabetes Maternal Aunt   . Cancer Maternal Grandmother 72    COLON CA  . Diabetes Maternal Grandmother   . Hypertension Maternal Grandfather   . Cancer Maternal Grandfather     prostate  . Asthma Mother   . Asthma Brother   . Anesthesia problems Neg Hx   . Hypotension Neg Hx   . Malignant hyperthermia Neg Hx   . Pseudochol deficiency Neg Hx   . Cancer Paternal Grandmother     breast    History  Substance  Use Topics  . Smoking status: Never Smoker   . Smokeless tobacco: Never Used  . Alcohol Use: Yes     Comment: not with preg    Allergies:  Allergies  Allergen Reactions  . Bactrim Anaphylaxis and Hives  . Prednisone Anaphylaxis and Hives  . Sulfamethoxazole-Tmp Ds Anaphylaxis  . Other Other (See Comments)    Shrimp:throat itching "can eat other shellfish"  . Pineapple Itching    Prescriptions prior to admission  Medication Sig Dispense Refill  . dexamethasone (DECADRON) 4 MG tablet 4 mg.      . hydroxychloroquine (PLAQUENIL) 200 MG tablet 100 mg.      . acetaminophen (TYLENOL) 325 MG tablet Take 650 mg by mouth every 6 (six) hours as needed for pain (For headache.).      Marland Kitchen albuterol (PROVENTIL HFA;VENTOLIN HFA) 108 (90 BASE) MCG/ACT inhaler Inhale 1-2 puffs into the lungs every 6 (six) hours as needed. For shortness of breath  3.7 g  2  . calcium carbonate (TUMS - DOSED IN MG ELEMENTAL CALCIUM) 500 MG chewable tablet Chew 2  tablets by mouth daily as needed for heartburn.      Marland Kitchen guaiFENesin-dextromethorphan (ROBITUSSIN DM) 100-10 MG/5ML syrup Take 5 mLs by mouth every 4 (four) hours as needed for cough.  118 mL  0  . Menthol-Methyl Salicylate (ICY HOT EXTRA STRENGTH) 10-30 % CREA Apply 1 application topically daily as needed (For joint pain.).      Marland Kitchen Multiple Vitamin (THERA) TABS 1 tablet.      . ondansetron (ZOFRAN) 4 MG tablet Take 1 tablet (4 mg total) by mouth every 8 (eight) hours as needed for nausea.  20 tablet  0  . Prenatal Vit-Fe Fumarate-FA (PRENATAL MULTIVITAMIN) TABS Take 1 tablet by mouth daily.        Review of Systems  Constitutional: Negative.   HENT: Negative.   Eyes: Negative.   Respiratory: Negative.   Cardiovascular: Negative.   Gastrointestinal: Negative.   Genitourinary: Negative.   Musculoskeletal: Positive for joint pain.  Skin: Negative.   Neurological: Negative.   Endo/Heme/Allergies: Negative.   Psychiatric/Behavioral: Negative.    Physical  Exam   Blood pressure 131/81, pulse 88, temperature 98.2 F (36.8 C), temperature source Oral, resp. rate 18, last menstrual period 01/20/2013.  Physical Exam  Nursing note and vitals reviewed. Constitutional: She is oriented to person, place, and time. She appears well-developed and well-nourished.  HENT:  Head: Normocephalic and atraumatic.  Right Ear: External ear normal.  Left Ear: External ear normal.  Nose: Nose normal.  Eyes: Pupils are equal, round, and reactive to light.  Neck: Normal range of motion. Neck supple.  Cardiovascular: Normal rate, regular rhythm and intact distal pulses.   Respiratory: Effort normal and breath sounds normal.  GI: Soft. Bowel sounds are normal. She exhibits no distension. There is no tenderness. There is no rebound and no guarding.  Genitourinary: Uterus normal. Vaginal discharge found.  Ut soft, NT.  Mod amt white discharge in vault.  Clinically c/w yeast.  SVE: closed/80% effaced/-3/? Presentation/posterior.    Musculoskeletal: Normal range of motion.  Neurological: She is alert and oriented to person, place, and time. She has normal reflexes.  Skin: Skin is warm and dry.  Psychiatric: She has a normal mood and affect. Her behavior is normal.   FHR baseline 145bpm.  Variability: Moderate.  FHR appropriate for gestational age. Toco:  UCs every 2-4 mins and mild to palpation.  MAU Course  Procedures GC/Chl obtained and pending Ultrasound to eval EFW and cervical length IV fluids Procardia No FFN obtained due to SVE in less than 24hrs prior to admission. Results for orders placed during the hospital encounter of 07/12/13 (from the past 24 hour(s))  URINALYSIS, ROUTINE W REFLEX MICROSCOPIC     Status: Abnormal   Collection Time    07/12/13 10:50 PM      Result Value Range   Color, Urine YELLOW  YELLOW   APPearance CLEAR  CLEAR   Specific Gravity, Urine 1.025  1.005 - 1.030   pH 6.0  5.0 - 8.0   Glucose, UA NEGATIVE  NEGATIVE mg/dL   Hgb  urine dipstick TRACE (*) NEGATIVE   Bilirubin Urine NEGATIVE  NEGATIVE   Ketones, ur NEGATIVE  NEGATIVE mg/dL   Protein, ur NEGATIVE  NEGATIVE mg/dL   Urobilinogen, UA 0.2  0.0 - 1.0 mg/dL   Nitrite NEGATIVE  NEGATIVE   Leukocytes, UA NEGATIVE  NEGATIVE  URINE MICROSCOPIC-ADD ON     Status: Abnormal   Collection Time    07/12/13 10:50 PM  Result Value Range   Squamous Epithelial / LPF RARE  RARE   WBC, UA    <3 WBC/hpf   Value: NO FORMED ELEMENTS SEEN ON URINE MICROSCOPIC EXAMINATION   RBC / HPF 0-2  <3 RBC/hpf   Bacteria, UA FEW (*) RARE  WET PREP, GENITAL     Status: Abnormal   Collection Time    07/13/13 12:05 AM      Result Value Range   Yeast Wet Prep HPF POC NONE SEEN  NONE SEEN   Trich, Wet Prep NONE SEEN  NONE SEEN   Clue Cells Wet Prep HPF POC FEW (*) NONE SEEN   WBC, Wet Prep HPF POC FEW (*) NONE SEEN   Assessment and Plan  IUP at 23w 6d Preterm contractions SLE/+SSA/+SSB Fetal first degree heartblock (per MFM)   Consult obtained with Dr. [name] Dopp.   Will await ultrasound results for further disposition - Cx length 3.39cm and EFW 647gm, normal amniotic fluid Due to persistent cramping/contractions, will admit to antepartum for observation.    Lamone Ferrelli O. 07/13/2013, 12:12 AM

## 2013-07-13 NOTE — H&P (Addendum)
Hayley Webb is a 27 y.o. female presenting for contractions at 23w 6d.  History  Pt presents to MAU at 23w 6d with c/o abdominal/pelvic cramping approximately every 5 mins over the past few hours. 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 Pt states that she had cramping in the early morning hours today and she was eval with SVE and treated with IV fluids. She also reports having a small amt of vag spotting earlier this evening. Denies recent intercourse. Denies ROM. Has noted vag discharge but denies itch/burn/odor. Notes normal fetal movement pattern. She contacted [facility] MFM prior to presenting and was told to come to [facility]. Negative U/A obtained at [facility] earlier today.   OB History   Grav Para Term Preterm Abortions TAB SAB Ect Mult Living   2    1  1         Past Medical History  Diagnosis Date  . 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 HSV infection   . Asthma   . Lupus     dx age 25  . Headache(784.0)   . Hypertension     hx of elevations, never been on meds  . Infection     UTI  . Anxiety     doing good now   Past Surgical History  Procedure Laterality Date  . Tonsillectomy and adenoidectomy  1998  . Mouth surgery      2 teeth removed- 1 wisdom and 1 in front of it  . Wisdom tooth extraction     Family History: family history includes Asthma in her brother and mother; Cancer in her maternal grandfather and paternal grandmother; Cancer (age of onset: 67) in her maternal grandmother; Diabetes in her maternal aunt and maternal grandmother; Hypertension in her maternal grandfather. There is no history of Anesthesia problems, Hypotension, Malignant hyperthermia, or Pseudochol deficiency. Social History:  reports that she has never smoked. She has never used smokeless tobacco. She reports  that she drinks alcohol. She reports that she does not use illicit drugs.  Hx Present Preg:  Entered prenatal care at [redacted]wks gestation.  Pt referred to MFM at [redacted]wks gestation.  Pt has been followed weekly by peds cardiology since 16wks due to +SSA/+SSB.  Pt with complaints of joint pain throughout her pregnancy course to date.    Prenatal Transfer Tool  Maternal Diabetes: No Genetic Screening: Declined Maternal Ultrasounds/Referrals: Normal Fetal Ultrasounds or other Referrals:  Fetal echo, Referred to Materal Fetal Medicine  Maternal Substance Abuse:  No Significant Maternal Medications:  Meds include: Other: Plaquenil, Dexamethasone Significant Maternal Lab Results:  Lab values include: Other: SLE/+SSA/+SSB Other Comments:  None  Review of Systems  Constitutional: Negative.   HENT: Negative.   Eyes: Negative.   Respiratory: Negative.   Cardiovascular: Negative.   Gastrointestinal: Negative.   Genitourinary: Negative.   Musculoskeletal: Positive for joint pain.  Skin: Negative.   Neurological: Negative.   Endo/Heme/Allergies: Negative.   Psychiatric/Behavioral: Negative.     Blood pressure 131/81, pulse 88, temperature 98.2 F (36.8 C), temperature source Oral, resp. rate 18, last menstrual period 01/20/2013. Exam Physical Exam  Nursing note and vitals reviewed. Constitutional: She is oriented to person, place, and time. She appears well-developed and well-nourished.  HENT:  Head: Normocephalic and atraumatic.  Right Ear: External ear normal.  Left Ear: External  ear normal.  Nose: Nose normal.  Eyes: Pupils are equal, round, and reactive to light.  Neck: Normal range of motion. Neck supple.  Cardiovascular: Normal rate, regular rhythm and intact distal pulses.   Respiratory: Effort normal and breath sounds normal.  GI: Soft. Bowel sounds are normal. She exhibits no distension. There is no tenderness. There is no rebound and no guarding.  Genitourinary: Uterus normal.  Vaginal discharge found.  Ut soft, NT.  Mod amt white discharge in vault.  Clinically c/w yeast.  SVE: closed/80% effaced/-3/? Presentation/posterior.    Musculoskeletal: Normal range of motion.  Neurological: She is alert and oriented to person, place, and time. She has normal reflexes.  Skin: Skin is warm and dry.  Psychiatric: She has a normal mood and affect. Her behavior is normal.   FHR baseline 145 bpm; variabilty-moderate; fetal heart rate appropriate for gestational age Toco: UCs every 2-4 mins, mild to palpation.   Prenatal labs: ABO, Rh: A positive Antibody: Negative Rubella: Immune RPR: Non reactive HBsAg: Negative HIV: Non reactive GBS: N/A  Assessment/Plan: IUP at 23w 6d Preterm contractions  Per consult with Dr. [name] Dopp, will place in observation on Antepartum for IVF and procardia for preterm contractions.     SMITH,NONA O. 07/13/2013, 2:51 AM   As above cervical length of ultrasound 3.3 cm.  Patient seen and examined  FHR reassuring with baseline of 140 no decelerations  Toco no contractions  A/P 23 wks 6 days with preterm contractions resolved with procardia  Pt home on modified bedrest with procardia and encouraged IV fluids  Follow up with [facility] in 2-3 days.

## 2013-07-14 LAB — GC/CHLAMYDIA PROBE AMP: CT Probe RNA: NEGATIVE

## 2013-07-15 ENCOUNTER — Ambulatory Visit (HOSPITAL_COMMUNITY): Admission: RE | Admit: 2013-07-15 | Payer: BC Managed Care – PPO | Source: Ambulatory Visit

## 2013-07-21 ENCOUNTER — Ambulatory Visit (HOSPITAL_COMMUNITY)
Admission: RE | Admit: 2013-07-21 | Discharge: 2013-07-21 | Disposition: A | Discharge status: Home / Self Care | Payer: BC Managed Care – PPO | Source: Ambulatory Visit | Attending: Obstetrics and Gynecology | Admitting: Obstetrics and Gynecology

## 2013-07-24 ENCOUNTER — Inpatient Hospital Stay (HOSPITAL_COMMUNITY)
Admission: AD | Admit: 2013-07-24 | Discharge: 2013-07-25 | Disposition: A | Discharge status: Home / Self Care | Payer: BC Managed Care – PPO | Source: Ambulatory Visit | Attending: Obstetrics and Gynecology | Admitting: Obstetrics and Gynecology

## 2013-07-24 ENCOUNTER — Encounter (HOSPITAL_COMMUNITY): Payer: Self-pay | Admitting: *Deleted

## 2013-07-24 DIAGNOSIS — R109 Unspecified abdominal pain: Secondary | ICD-10-CM | POA: Insufficient documentation

## 2013-07-24 DIAGNOSIS — O4700 False labor before 37 completed weeks of gestation, unspecified trimester: Secondary | ICD-10-CM | POA: Insufficient documentation

## 2013-07-24 DIAGNOSIS — O26859 Spotting complicating pregnancy, unspecified trimester: Secondary | ICD-10-CM | POA: Insufficient documentation

## 2013-07-24 LAB — URINALYSIS, ROUTINE W REFLEX MICROSCOPIC
BILIRUBIN URINE: NEGATIVE
Glucose, UA: NEGATIVE mg/dL
Hgb urine dipstick: NEGATIVE
KETONES UR: 40 mg/dL — AB
Leukocytes, UA: NEGATIVE
Nitrite: NEGATIVE
PROTEIN: NEGATIVE mg/dL
Specific Gravity, Urine: 1.01 (ref 1.005–1.030)
UROBILINOGEN UA: 0.2 mg/dL (ref 0.0–1.0)
pH: 6 (ref 5.0–8.0)

## 2013-07-24 LAB — WET PREP, GENITAL
CLUE CELLS WET PREP: NONE SEEN
Trich, Wet Prep: NONE SEEN
Yeast Wet Prep HPF POC: NONE SEEN

## 2013-07-24 LAB — FETAL FIBRONECTIN: FETAL FIBRONECTIN: NEGATIVE

## 2013-07-24 MED ORDER — NIFEDIPINE 10 MG PO CAPS
10.0000 mg | ORAL_CAPSULE | ORAL | Status: AC | PRN
  Administered 2013-07-24 (×4): 10 mg via ORAL
  Filled 2013-07-24 (×4): qty 1

## 2013-07-24 MED ORDER — LACTATED RINGERS IV SOLN: INTRAVENOUS | Status: DC

## 2013-07-24 MED ORDER — LACTATED RINGERS IV BOLUS (SEPSIS)
500.0000 mL | Freq: Once | INTRAVENOUS | Status: AC
  Administered 2013-07-24: 500 mL via INTRAVENOUS

## 2013-07-24 NOTE — MAU Note (Signed)
Cramping started at 1400. Noticed pink mucous around 1600.

## 2013-07-24 NOTE — Progress Notes (Signed)
Received orders for FFN & wet prep. States will come down to see patient.

## 2013-07-24 NOTE — L&D Delivery Note (Signed)
Operative Delivery Note At 5:38 PM a viable female was delivered via Vaginal, [employer], operational(Extractor).  Presentation: vertex; ; Station: +4 - crowning.  Loose nuchal cord noted with delivery of head, reduced over head prior to delivery of body.  Pt was complete and +3 to +4, pt pushed adequately however variables were noted with each UC.  Dr. Charlotta Newtonzan was called into room for VAVB.  Verbal consent: obtained from patient.  Risks and benefits discussed in detail.  Risks include, but are not limited to the risks of anesthesia, bleeding, infection, damage to maternal tissues, fetal cephalhematoma.  There is also the risk of inability to effect vaginal delivery of the head, or shoulder dystocia that cannot be resolved by established maneuvers, leading to the need for emergency cesarean section.  APGAR: 9, 9; weight .   Placenta status: Intact, Spontaneous; to Pathology.   Cord: 3 vessels with the following complications: None.  Cord pH: not collected  Anesthesia: Epidural  Instruments: see Dr. Lawana Chamberszan's additional note Episiotomy: None Lacerations: 1st degree Suture Repair: 3.0 vicryl Est. Blood Loss (mL): 300  Mom to postpartum.  Baby to Nursery for observation and EKG per MFM consult.  Routine PP orders Breastfeeding Placenta to pathology Circ in office    Hayley Webb, Hayley Webb 10/25/2013, 6:12 PM

## 2013-07-24 NOTE — MAU Provider Note (Signed)
History   27yo, G2P0010 at [redacted]w[redacted]d presents to MAU for cramping which started at 1400. Noticed pink mucous around 1600.  Denies LOF, recent fever, resp or GI c/o's, UTI or PIH s/s. GFM.    Chief Complaint  Patient presents with  . Abdominal Cramping  . pink mucous    HPI  OB History   Grav Para Term Preterm Abortions TAB SAB Ect Mult Living   2    1  1          Past Medical History  Diagnosis Date  . 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 HSV infection   . Asthma   . Lupus     dx age 50  . Headache(784.0)   . Hypertension     hx of elevations, never been on meds  . Infection     UTI  . Anxiety     doing good now    Past Surgical History  Procedure Laterality Date  . Tonsillectomy and adenoidectomy  1998  . Mouth surgery      2 teeth removed- 1 wisdom and 1 in front of it  . Wisdom tooth extraction      Family History  Problem Relation Age of Onset  . Diabetes Maternal Aunt   . Cancer Maternal Grandmother 72    COLON CA  . Diabetes Maternal Grandmother   . Hypertension Maternal Grandfather   . Cancer Maternal Grandfather     prostate  . Asthma Mother   . Asthma Brother   . Anesthesia problems Neg Hx   . Hypotension Neg Hx   . Malignant hyperthermia Neg Hx   . Pseudochol deficiency Neg Hx   . Cancer Paternal Grandmother     breast    History  Substance Use Topics  . Smoking status: Never Smoker   . Smokeless tobacco: Never Used  . Alcohol Use: Yes     Comment: not with preg    Allergies:  Allergies  Allergen Reactions  . Bactrim Anaphylaxis and Hives  . Prednisone Anaphylaxis and Hives  . Sulfamethoxazole-Tmp Ds Anaphylaxis  . Other Other (See Comments)    Shrimp:throat itching "can eat other shellfish"  . Pineapple Itching    Prescriptions prior to admission  Medication Sig Dispense Refill  . albuterol (PROVENTIL HFA;VENTOLIN HFA) 108 (90 BASE) MCG/ACT inhaler Inhale 1-2 puffs into the lungs every 6 (six) hours as  needed. For shortness of breath  3.7 g  2  . calcium carbonate (TUMS - DOSED IN MG ELEMENTAL CALCIUM) 500 MG chewable tablet Chew 2 tablets by mouth daily as needed for heartburn.      . dexamethasone (DECADRON) 4 MG tablet Take 4 mg by mouth daily.      . diphenhydramine-acetaminophen (TYLENOL PM) 25-500 MG TABS Take 2 tablets by mouth daily as needed (sleep).      Marland Kitchen guaiFENesin-dextromethorphan (ROBITUSSIN DM) 100-10 MG/5ML syrup Take 5 mLs by mouth every 4 (four) hours as needed for cough.  118 mL  0  . hydroxychloroquine (PLAQUENIL) 200 MG tablet Take 100 mg by mouth daily.      Marland Kitchen NIFEdipine (PROCARDIA) 10 MG capsule Take 1 capsule (10 mg total) by mouth every 4 (four) hours as needed (contractions).  30 capsule  0  . Prenatal Vit-Fe Fumarate-FA (PRENATAL MULTIVITAMIN) TABS Take 1 tablet by mouth daily.        ROS ROS: see HPI above, all other systems are negative  Physical Exam   Blood  pressure 134/87, pulse 114, temperature 97.4 F (36.3 C), temperature source Oral, resp. rate 18, height 5\' 9"  (1.753 m), weight 301 lb (136.533 kg), last menstrual period 01/20/2013.  Physical Exam Chest: Clear Heart: RRR Abdomen: gravid, NT Extremities: WNL  Pelvic exam: normal external genitalia, vulva, vagina, cervix, uterus and adnexa. Cervix: closed / TH / High  FHT: Doppler 135 UCs: Occasional, mild  ED Course  IUP at 3972w3d Cramping and pink spotting  UA Wet prep - neg FFN - neg  C/w Dr. Estanislado Pandyivard Procardia 20 mg Q 6 until 28 weeks Discharge home with precautions Follow up on 1/7 at an already scheduled [facility]   Haroldine LawsOXLEY, Syeda Prickett [name], MSN 07/24/2013 9:15 PM

## 2013-07-25 ENCOUNTER — Encounter: Payer: Self-pay | Admitting: Obstetrics and Gynecology

## 2013-07-25 ENCOUNTER — Inpatient Hospital Stay (HOSPITAL_COMMUNITY)
Admission: AD | Admit: 2013-07-25 | Discharge: 2013-07-26 | Disposition: A | Discharge status: Home / Self Care | Payer: BC Managed Care – PPO | Source: Ambulatory Visit | Attending: Obstetrics and Gynecology | Admitting: Obstetrics and Gynecology

## 2013-07-25 DIAGNOSIS — O4700 False labor before 37 completed weeks of gestation, unspecified trimester: Secondary | ICD-10-CM | POA: Insufficient documentation

## 2013-07-25 MED ORDER — BETAMETHASONE SOD PHOS & ACET 6 (3-3) MG/ML IJ SUSP
12.0000 mg | Freq: Once | INTRAMUSCULAR | Status: AC
  Administered 2013-07-25: 12 mg via INTRAMUSCULAR
  Filled 2013-07-25: qty 2

## 2013-07-25 MED ORDER — NIFEDIPINE 10 MG PO CAPS: 20.0000 mg | ORAL_CAPSULE | Freq: Four times a day (QID) | ORAL | Status: DC

## 2013-07-25 NOTE — Discharge Instructions (Signed)
Preterm Labor Information Preterm labor is when labor starts at less than 37 weeks of pregnancy. The normal length of a pregnancy is 39 to 41 weeks. CAUSES Often, there is no identifiable underlying cause as to why a woman goes into preterm labor. One of the most common known causes of preterm labor is infection. Infections of the uterus, cervix, vagina, amniotic sac, bladder, kidney, or even the lungs (pneumonia) can cause labor to start. Other suspected causes of preterm labor include:   Urogenital infections, such as yeast infections and bacterial vaginosis.   Uterine abnormalities (uterine shape, uterine septum, fibroids, or bleeding from the placenta).   A cervix that has been operated on (it may fail to stay closed).   Malformations in the fetus.   Multiple gestations (twins, triplets, and so on).   Breakage of the amniotic sac.  RISK FACTORS  Having a previous history of preterm labor.   Having premature rupture of membranes (PROM).   Having a placenta that covers the opening of the cervix (placenta previa).   Having a placenta that separates from the uterus (placental abruption).   Having a cervix that is too weak to hold the fetus in the uterus (incompetent cervix).   Having too much fluid in the amniotic sac (polyhydramnios).   Taking illegal drugs or smoking while pregnant.   Not gaining enough weight while pregnant.   Being younger than 18 and older than 26 years old.   Having a low socioeconomic status.   Being African American. SYMPTOMS Signs and symptoms of preterm labor include:   Menstrual-like cramps, abdominal pain, or back pain.  Uterine contractions that are regular, as frequent as six in an hour, regardless of their intensity (may be mild or painful).  Contractions that start on the top of the uterus and spread down to the lower abdomen and back.   A sense of increased pelvic pressure.   A watery or bloody mucus discharge that  comes from the vagina.  TREATMENT Depending on the length of the pregnancy and other circumstances, your health care provider may suggest bed rest. If necessary, there are medicines that can be given to stop contractions and to mature the fetal lungs. If labor happens before 34 weeks of pregnancy, a prolonged hospital stay may be recommended. Treatment depends on the condition of both you and the fetus.  WHAT SHOULD YOU DO IF YOU THINK YOU ARE IN PRETERM LABOR? Call your health care provider right away. You will need to go to the hospital to get checked immediately. HOW CAN YOU PREVENT PRETERM LABOR IN FUTURE PREGNANCIES? You should:   Stop smoking if you smoke.  Maintain healthy weight gain and avoid chemicals and drugs that are not necessary.  Be watchful for any type of infection.  Inform your health care provider if you have a known history of preterm labor. Document Released: 09/30/2003 Document Revised: 03/12/2013 Document Reviewed: 08/12/2012 [employer] Patient Information 2014 [employer].    

## 2013-07-26 MED ORDER — BETAMETHASONE SOD PHOS & ACET 6 (3-3) MG/ML IJ SUSP
12.5000 mg | Freq: Once | INTRAMUSCULAR | Status: AC
  Administered 2013-07-26: 12.5 mg via INTRAMUSCULAR
  Filled 2013-07-26: qty 2.1

## 2013-07-26 NOTE — MAU Note (Signed)
Here for 2nd Betamethasone inj.

## 2013-07-26 NOTE — MAU Note (Signed)
Manfred ArchV. Latham [name] on unit and aware of pt's B/P and pulse. Pt given water and reminded to drink lots of flds daily. Inj given and pt home

## 2013-08-08 ENCOUNTER — Encounter: Payer: Self-pay | Admitting: Obstetrics and Gynecology

## 2013-08-12 ENCOUNTER — Inpatient Hospital Stay (HOSPITAL_COMMUNITY)
Admission: AD | Admit: 2013-08-12 | Discharge: 2013-08-13 | DRG: 778 | Disposition: A | Discharge status: Home / Self Care | Payer: Medicaid Other | Source: Ambulatory Visit | Attending: Obstetrics and Gynecology | Admitting: Obstetrics and Gynecology

## 2013-08-12 ENCOUNTER — Inpatient Hospital Stay (HOSPITAL_COMMUNITY): Payer: Medicaid Other

## 2013-08-12 ENCOUNTER — Encounter (HOSPITAL_COMMUNITY): Payer: Self-pay

## 2013-08-12 DIAGNOSIS — O99419 Diseases of the circulatory system complicating pregnancy, unspecified trimester: Secondary | ICD-10-CM

## 2013-08-12 DIAGNOSIS — O4700 False labor before 37 completed weeks of gestation, unspecified trimester: Principal | ICD-10-CM | POA: Diagnosis present

## 2013-08-12 DIAGNOSIS — Z6841 Body Mass Index (BMI) 40.0 and over, adult: Secondary | ICD-10-CM

## 2013-08-12 DIAGNOSIS — I440 Atrioventricular block, first degree: Secondary | ICD-10-CM | POA: Diagnosis present

## 2013-08-12 DIAGNOSIS — R109 Unspecified abdominal pain: Secondary | ICD-10-CM | POA: Diagnosis present

## 2013-08-12 DIAGNOSIS — M329 Systemic lupus erythematosus, unspecified: Secondary | ICD-10-CM | POA: Diagnosis present

## 2013-08-12 DIAGNOSIS — O99210 Obesity complicating pregnancy, unspecified trimester: Secondary | ICD-10-CM

## 2013-08-12 DIAGNOSIS — O99891 Other specified diseases and conditions complicating pregnancy: Secondary | ICD-10-CM | POA: Diagnosis present

## 2013-08-12 DIAGNOSIS — O9989 Other specified diseases and conditions complicating pregnancy, childbirth and the puerperium: Secondary | ICD-10-CM

## 2013-08-12 DIAGNOSIS — I2510 Atherosclerotic heart disease of native coronary artery without angina pectoris: Secondary | ICD-10-CM | POA: Diagnosis present

## 2013-08-12 DIAGNOSIS — E669 Obesity, unspecified: Secondary | ICD-10-CM | POA: Diagnosis present

## 2013-08-12 LAB — WET PREP, GENITAL
CLUE CELLS WET PREP: NONE SEEN
Trich, Wet Prep: NONE SEEN
Yeast Wet Prep HPF POC: NONE SEEN

## 2013-08-12 LAB — URINE MICROSCOPIC-ADD ON

## 2013-08-12 LAB — URINALYSIS, ROUTINE W REFLEX MICROSCOPIC
Bilirubin Urine: NEGATIVE
Glucose, UA: NEGATIVE mg/dL
Ketones, ur: 15 mg/dL — AB
LEUKOCYTES UA: NEGATIVE
Nitrite: NEGATIVE
PH: 6.5 (ref 5.0–8.0)
Protein, ur: NEGATIVE mg/dL
SPECIFIC GRAVITY, URINE: 1.02 (ref 1.005–1.030)
Urobilinogen, UA: 0.2 mg/dL (ref 0.0–1.0)

## 2013-08-12 LAB — CBC
HCT: 33.5 % — ABNORMAL LOW (ref 36.0–46.0)
Hemoglobin: 11.2 g/dL — ABNORMAL LOW (ref 12.0–15.0)
MCH: 28.7 pg (ref 26.0–34.0)
MCHC: 33.4 g/dL (ref 30.0–36.0)
MCV: 85.9 fL (ref 78.0–100.0)
Platelets: 231 10*3/uL (ref 150–400)
RBC: 3.9 MIL/uL (ref 3.87–5.11)
RDW: 14.1 % (ref 11.5–15.5)
WBC: 6.8 10*3/uL (ref 4.0–10.5)

## 2013-08-12 LAB — FETAL FIBRONECTIN: FETAL FIBRONECTIN: NEGATIVE

## 2013-08-12 MED ORDER — ZOLPIDEM TARTRATE 5 MG PO TABS: 5.0000 mg | ORAL_TABLET | Freq: Every evening | ORAL | Status: DC | PRN

## 2013-08-12 MED ORDER — ACETAMINOPHEN 325 MG PO TABS: 650.0000 mg | ORAL_TABLET | ORAL | Status: DC | PRN

## 2013-08-12 MED ORDER — NIFEDIPINE 10 MG PO CAPS
10.0000 mg | ORAL_CAPSULE | Freq: Four times a day (QID) | ORAL | Status: DC
  Administered 2013-08-12 – 2013-08-13 (×4): 10 mg via ORAL
  Filled 2013-08-12 (×4): qty 1

## 2013-08-12 MED ORDER — NIFEDIPINE 10 MG PO CAPS
10.0000 mg | ORAL_CAPSULE | ORAL | Status: AC | PRN
  Administered 2013-08-12 (×4): 10 mg via ORAL
  Filled 2013-08-12 (×4): qty 1

## 2013-08-12 MED ORDER — PRENATAL MULTIVITAMIN CH
1.0000 | ORAL_TABLET | Freq: Every day | ORAL | Status: DC
  Administered 2013-08-13: 1 via ORAL
  Filled 2013-08-12: qty 1

## 2013-08-12 MED ORDER — ACETAMINOPHEN 325 MG PO TABS
650.0000 mg | ORAL_TABLET | Freq: Four times a day (QID) | ORAL | Status: DC | PRN
  Administered 2013-08-12: 650 mg via ORAL
  Filled 2013-08-12: qty 2

## 2013-08-12 MED ORDER — CALCIUM CARBONATE ANTACID 500 MG PO CHEW: 2.0000 | CHEWABLE_TABLET | ORAL | Status: DC | PRN

## 2013-08-12 MED ORDER — DEXAMETHASONE 4 MG PO TABS
4.0000 mg | ORAL_TABLET | Freq: Every day | ORAL | Status: DC
  Administered 2013-08-12 – 2013-08-13 (×2): 4 mg via ORAL
  Filled 2013-08-12 (×2): qty 1

## 2013-08-12 MED ORDER — NIFEDIPINE 10 MG PO CAPS
20.0000 mg | ORAL_CAPSULE | Freq: Once | ORAL | Status: AC
  Administered 2013-08-12: 20 mg via ORAL
  Filled 2013-08-12: qty 2

## 2013-08-12 MED ORDER — HYDROXYCHLOROQUINE SULFATE 200 MG PO TABS
100.0000 mg | ORAL_TABLET | Freq: Every day | ORAL | Status: DC
  Administered 2013-08-12: 16:00:00 via ORAL
  Administered 2013-08-13: 100 mg via ORAL
  Filled 2013-08-12 (×2): qty 0.5

## 2013-08-12 MED ORDER — ALBUTEROL SULFATE HFA 108 (90 BASE) MCG/ACT IN AERS: 1.0000 | INHALATION_SPRAY | Freq: Four times a day (QID) | RESPIRATORY_TRACT | Status: DC | PRN

## 2013-08-12 MED ORDER — ALBUTEROL SULFATE (2.5 MG/3ML) 0.083% IN NEBU
2.5000 mg | INHALATION_SOLUTION | Freq: Four times a day (QID) | RESPIRATORY_TRACT | Status: DC | PRN
Start: 2013-08-12 — End: 2013-08-13
  Filled 2013-08-12: qty 3

## 2013-08-12 MED ORDER — OXYCODONE-ACETAMINOPHEN 5-325 MG PO TABS
1.0000 | ORAL_TABLET | ORAL | Status: DC | PRN
  Administered 2013-08-12 – 2013-08-13 (×2): 2 via ORAL
  Filled 2013-08-12 (×2): qty 2

## 2013-08-12 MED ORDER — DOCUSATE SODIUM 100 MG PO CAPS: 100.0000 mg | ORAL_CAPSULE | Freq: Every day | ORAL | Status: DC

## 2013-08-12 MED ORDER — LACTATED RINGERS IV SOLN
INTRAVENOUS | Status: DC
  Administered 2013-08-12 – 2013-08-13 (×3): via INTRAVENOUS

## 2013-08-12 NOTE — MAU Note (Signed)
Bedside sono by cnm confirms vtx .

## 2013-08-12 NOTE — MAU Note (Signed)
"  don't know if it the way I am laying, but it feels a little hard to breath". Pt repositioned, HOB elevated. o2 sat applied. Feels easier per pt

## 2013-08-12 NOTE — MAU Note (Addendum)
Discharge noted since Sat, no bleeding or leaking. Increased crampiness and pressure, last dose Procardia 2130

## 2013-08-12 NOTE — MAU Provider Note (Signed)
History   28 yo G2P0010 at 6928 1/7 weeks presented unannounced c/o cramping and vaginal pressure x 4 days, worse during the night.  On Procardia prn, with last dose at 9:30pm.  Reports increased d/c this week, denies leaking or bleeding,  Reports +FM.  Was admitted 12/21 for PT contractions s/p IV IG x 24 hours--had normal cervical length, with resolution of UCs with IV hydration and Procardia.  FFN negative 07/24/13.  No recent IC.  Reports last US 1 week ago, with normal growth.  Patient Active Problem List   Diagnosis Date Noted  . Preterm contractions 07/13/2013  . Systemic lupus erythematosus 07/10/2013  . Obesity-BMI 47 05/09/2013  . Hirsutism 10/02/2012  . Weight gain 10/02/2012  . Female pelvic pain 09/20/2012  . STD (female) 01/18/2012  . Lupus   . Asthma   . 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 Being followed by MFM.    Chief Complaint  Patient presents with  . Vaginal Pain  . Contractions   HPI:  See above  OB History   Grav Para Term Preterm Abortions TAB SAB Ect Mult Living   2    1  1          Past Medical History  Diagnosis Date  . 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 HSV infection   . Asthma   . Lupus     dx age 28  . Headache(784.0)   . Hypertension     hx of elevations, never been on meds  . Infection     UTI  . Anxiety     doing good now    Past Surgical History  Procedure Laterality Date  . Tonsillectomy and adenoidectomy  1998  . Mouth surgery      2 teeth removed- 1 wisdom and 1 in front of it  . Wisdom tooth extraction      Family History  Problem Relation Age of Onset  . Diabetes Maternal Aunt   . Cancer Maternal Grandmother 72    COLON CA  . Diabetes Maternal Grandmother   . Hypertension Maternal Grandfather   . Cancer Maternal Grandfather     prostate  . Asthma Mother   . Asthma Brother   . Anesthesia problems Neg Hx   . Hypotension Neg Hx   .  Malignant hyperthermia Neg Hx   . Pseudochol deficiency Neg Hx   . Cancer Paternal Grandmother     breast    History  Substance Use Topics  . Smoking status: Never Smoker   . Smokeless tobacco: Never Used  . Alcohol Use: No     Comment: not with preg    Allergies:  Allergies  Allergen Reactions  . Bactrim Anaphylaxis and Hives  . Prednisone Anaphylaxis and Hives  . Sulfamethoxazole-Tmp Ds Anaphylaxis  . Other Other (See Comments)    Shrimp:throat itching "can eat other shellfish"  . Pineapple Itching    Prescriptions prior to admission  Medication Sig Dispense Refill  . albuterol (PROVENTIL HFA;VENTOLIN HFA) 108 (90 BASE) MCG/ACT inhaler Inhale 1-2 puffs into the lungs every 6 (six) hours as needed. For shortness of breath  3.7 g  2  . calcium carbonate (TUMS - DOSED IN MG ELEMENTAL CALCIUM) 500 MG chewable tablet Chew 2 tablets by mouth daily as needed for heartburn.      . dexamethasone (DECADRON) 4 MG tablet Take 4 mg by mouth  daily.      . diphenhydramine-acetaminophen (TYLENOL PM) 25-500 MG TABS Take 2 tablets by mouth daily as needed (sleep).      . hydroxychloroquine (PLAQUENIL) 200 MG tablet Take 100 mg by mouth daily.      Marland Kitchen NIFEdipine (PROCARDIA) 10 MG capsule Take 2 capsules (20 mg total) by mouth every 6 (six) hours.  30 capsule  0  . Prenatal Vit-Fe Fumarate-FA (PRENATAL MULTIVITAMIN) TABS Take 1 tablet by mouth daily.        ROS:  Cramping, d/c, +FM Physical Exam   Blood pressure 127/79, pulse 107, temperature 97.7 F (36.5 C), temperature source Oral, resp. rate 18, height 5\' 9"  (1.753 m), weight 306 lb (138.801 kg), last menstrual period 01/20/2013, SpO2 98.00%.  Physical Exam Chest clear Heart RRR without murmur Abd gravid, NT Pelvic--small amount white, mucusy d/c in vault.  Cervix 1 cm, thick, ? Presenting part.  Vtx presentation verified by BS [place]. Ext WNL  FHR Category 2--10 beat accels, no decel, decreased variability on initial tracing. UCs  irritability noted, with occasional more defined UCs.  ED Course  IUP at 28 1/7 weeks PT uterine activity Lupus  Plan: PO hydration FFN GC, chlamydia, wet prep, GBS UA Procardia regimen in MAU. Dr. Normand Sloop will f/u.   Nigel Bridgeman [name], [place] 08/12/2013 8:23 AM

## 2013-08-12 NOTE — MAU Note (Signed)
Pt reports vaginal pressure x 4 days, contractions off/on for a while but worsening this am. Denies bleeding.

## 2013-08-12 NOTE — H&P (Signed)
28 yo G2P0010 at 28 1/7 weeks presented unannounced c/o cramping and vaginal pressure x 4 days, worse during the night. On Procardia prn, with last dose at 9:30pm. Reports increased d/c this week, denies leaking or bleeding, Reports +FM. Was admitted 12/21 for PT contractions s/p IV IG x 24 hours--had normal cervical length, with resolution of UCs with IV hydration and Procardia.  FFN negative 07/24/13.  No recent IC.  Reports last [place] 1 week ago, with normal growth.  Patient Active Problem List    Diagnosis  Date Noted   .  Preterm contractions  07/13/2013   .  Systemic lupus erythematosus  07/10/2013   .  Obesity-BMI 47  05/09/2013   .  Hirsutism  10/02/2012   .  Weight gain  10/02/2012   .  Female pelvic pain  09/20/2012   .  STD (female)  01/18/2012   .  Lupus    .  Asthma    .  HSV infection    Received [facility] at [facility] on 12/20 for fetus with 1st degree heart block.  Being followed by MFM.  Chief Complaint   Patient presents with   .  Vaginal Pain   .  Contractions    HPI: See above  OB History    Grav  Para  Term  Preterm  Abortions  TAB  SAB  Ect  Mult  Living    2     1   1          Past Medical History   Diagnosis  Date   .  STD (sexually transmitted disease)  02/2009     POSITIVE GC   .  HSV infection    .  Asthma    .  Lupus      dx age 71   .  Headache(784.0)    .  Hypertension      hx of elevations, never been on meds   .  Infection      UTI   .  Anxiety      doing good now    Past Surgical History   Procedure  Laterality  Date   .  Tonsillectomy and adenoidectomy   1998   .  Mouth surgery       2 teeth removed- 1 wisdom and 1 in front of it   .  Wisdom tooth extraction      Family History   Problem  Relation  Age of Onset   .  Diabetes  Maternal Aunt    .  Cancer  Maternal Grandmother  72     COLON CA   .  Diabetes  Maternal Grandmother    .  Hypertension  Maternal Grandfather    .  Cancer  Maternal Grandfather      prostate   .  Asthma   Mother    .  Asthma  Brother    .  Anesthesia problems  Neg Hx    .  Hypotension  Neg Hx    .  Malignant hyperthermia  Neg Hx    .  Pseudochol deficiency  Neg Hx    .  Cancer  Paternal Grandmother      breast    History   Substance Use Topics   .  Smoking status:  Never Smoker   .  Smokeless tobacco:  Never Used   .  Alcohol Use:  No      Comment: not with preg  Allergies:  Allergies   Allergen  Reactions   .  Bactrim  Anaphylaxis and Hives   .  Prednisone  Anaphylaxis and Hives   .  Sulfamethoxazole-Tmp Ds  Anaphylaxis   .  Other  Other (See Comments)     Shrimp:throat itching  "can eat other shellfish"   .  Pineapple  Itching    Prescriptions prior to admission   Medication  Sig  Dispense  Refill   .  albuterol (PROVENTIL HFA;VENTOLIN HFA) 108 (90 BASE) MCG/ACT inhaler  Inhale 1-2 puffs into the lungs every 6 (six) hours as needed. For shortness of breath  3.7 g  2   .  calcium carbonate (TUMS - DOSED IN MG ELEMENTAL CALCIUM) 500 MG chewable tablet  Chew 2 tablets by mouth daily as needed for heartburn.     .  dexamethasone (DECADRON) 4 MG tablet  Take 4 mg by mouth daily.     .  diphenhydramine-acetaminophen (TYLENOL PM) 25-500 MG TABS  Take 2 tablets by mouth daily as needed (sleep).     .  hydroxychloroquine (PLAQUENIL) 200 MG tablet  Take 100 mg by mouth daily.     Marland Kitchen.  NIFEdipine (PROCARDIA) 10 MG capsule  Take 2 capsules (20 mg total) by mouth every 6 (six) hours.  30 capsule  0   .  Prenatal Vit-Fe Fumarate-FA (PRENATAL MULTIVITAMIN) TABS  Take 1 tablet by mouth daily.      ROS: Cramping, d/c, +FM  Physical Exam   Blood pressure 127/79, pulse 107, temperature 97.7 F (36.5 C), temperature source Oral, resp. rate 18, height 5\' 9"  (1.753 m), weight 306 lb (138.801 kg), last menstrual period 01/20/2013, SpO2 98.00%.  Physical Exam  Chest clear  Heart RRR without murmur  Abd gravid, NT Pelvic--small amount white, mucusy d/c in vault. Cervix 1 cm, thick, ? Presenting  part. Vtx presentation verified by BS US.  Ext WNL  FHR Category 2--10 beat accels, no decel, decreased variability on initial tracing.  UCs irritability noted, with occasional more defined UCs.  ED Course   IUP at 28 1/7 weeks  PT uterine activity  Lupus  Plan: Pt still uncomfortable with contractions She received steroids last week Will bring in for observation, procardia and IVF FFN neg

## 2013-08-12 NOTE — Progress Notes (Signed)
Pt still c/o headache and requesting more pain medication

## 2013-08-12 NOTE — MAU Note (Signed)
Pt called out, had been up to the bathroom, starting to feel pain and pressure again.  Ctx noted on monitor. Waiting on MD call back.

## 2013-08-12 NOTE — Progress Notes (Signed)
Pt off the cardio after reassurring FHR  

## 2013-08-13 ENCOUNTER — Ambulatory Visit (HOSPITAL_COMMUNITY)
Admission: RE | Admit: 2013-08-13 | Discharge: 2013-08-13 | Disposition: A | Discharge status: Home / Self Care | Payer: Medicaid Other | Source: Ambulatory Visit | Attending: Obstetrics and Gynecology | Admitting: Obstetrics and Gynecology

## 2013-08-13 DIAGNOSIS — M329 Systemic lupus erythematosus, unspecified: Secondary | ICD-10-CM

## 2013-08-13 LAB — GC/CHLAMYDIA PROBE AMP
CT Probe RNA: NEGATIVE
GC PROBE AMP APTIMA: NEGATIVE

## 2013-08-13 MED ORDER — DOCUSATE SODIUM 100 MG PO CAPS: 100.0000 mg | ORAL_CAPSULE | Freq: Every day | ORAL | Status: DC

## 2013-08-13 MED ORDER — ZOLPIDEM TARTRATE 5 MG PO TABS: 5.0000 mg | ORAL_TABLET | Freq: Every evening | ORAL | Status: DC | PRN

## 2013-08-13 MED ORDER — NIFEDIPINE 10 MG PO CAPS
10.0000 mg | ORAL_CAPSULE | Freq: Once | ORAL | Status: AC
  Administered 2013-08-13: 10 mg via ORAL
  Filled 2013-08-13: qty 1

## 2013-08-13 MED ORDER — DSS 100 MG PO CAPS: 100.0000 mg | ORAL_CAPSULE | Freq: Every day | ORAL | Status: DC

## 2013-08-13 MED ORDER — DOCUSATE SODIUM 100 MG PO CAPS
100.0000 mg | ORAL_CAPSULE | Freq: Once | ORAL | Status: AC
  Administered 2013-08-13: 100 mg via ORAL
  Filled 2013-08-13: qty 1

## 2013-08-13 NOTE — Discharge Instructions (Signed)
Follow up with pediatric cardiology as scheduled With MFM 09/10/13 at 10:00 Preterm Labor Information Preterm labor is when labor starts at less than 37 weeks of pregnancy. The normal length of a pregnancy is 39 to 41 weeks. CAUSES Often, there is no identifiable underlying cause as to why a woman goes into preterm labor. One of the most common known causes of preterm labor is infection. Infections of the uterus, cervix, vagina, amniotic sac, bladder, kidney, or even the lungs (pneumonia) can cause labor to start. Other suspected causes of preterm labor include:   Urogenital infections, such as yeast infections and bacterial vaginosis.   Uterine abnormalities (uterine shape, uterine septum, fibroids, or bleeding from the placenta).   A cervix that has been operated on (it may fail to stay closed).   Malformations in the fetus.   Multiple gestations (twins, triplets, and so on).   Breakage of the amniotic sac.  RISK FACTORS  Having a previous history of preterm labor.   Having premature rupture of membranes (PROM).   Having a placenta that covers the opening of the cervix (placenta previa).   Having a placenta that separates from the uterus (placental abruption).   Having a cervix that is too weak to hold the fetus in the uterus (incompetent cervix).   Having too much fluid in the amniotic sac (polyhydramnios).   Taking illegal drugs or smoking while pregnant.   Not gaining enough weight while pregnant.   Being younger than 3818 and older than 26 years old.   Having a low socioeconomic status.   Being African American. SYMPTOMS Signs and symptoms of preterm labor include:   Menstrual-like cramps, abdominal pain, or back pain.  Uterine contractions that are regular, as frequent as six in an hour, regardless of their intensity (may be mild or painful).  Contractions that start on the top of the uterus and spread down to the lower abdomen and back.   A  sense of increased pelvic pressure.   A watery or bloody mucus discharge that comes from the vagina.  TREATMENT Depending on the length of the pregnancy and other circumstances, your health care provider may suggest bed rest. If necessary, there are medicines that can be given to stop contractions and to mature the fetal lungs. If labor happens before 34 weeks of pregnancy, a prolonged hospital stay may be recommended. Treatment depends on the condition of both you and the fetus.  WHAT SHOULD YOU DO IF YOU THINK YOU ARE IN PRETERM LABOR? Call your health care provider right away. You will need to go to the hospital to get checked immediately. HOW CAN YOU PREVENT PRETERM LABOR IN FUTURE PREGNANCIES? You should:   Stop smoking if you smoke.  Maintain healthy weight gain and avoid chemicals and drugs that are not necessary.  Be watchful for any type of infection.  Inform your health care provider if you have a known history of preterm labor. Document Released: 09/30/2003 Document Revised: 03/12/2013 Document Reviewed: 08/12/2012 [facility] Patient Information 2014 [place], [place].

## 2013-08-13 NOTE — Consult Note (Signed)
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 She is currently admitted due to preterm contractions and pelvic pressure/ pain.  Her fetal fibronectin at the time of admission was negative and she was noted to have a normal cervical length by transvaginal ultrasound.  [name] is currently followed by Rheumatology - she is currently on Plaquenil 200 mg daily.  Additionally, she is followed by [facility] cardiology - she completed a course of IVIG and is currently on daily Dexamethasone - the mechanical PR intervals have improved on her current medications.  [name] reports that she had a Lupus exacerbation in December 2014 - associated with hand/ finger pain and swelling, dry mouth and overall feeling very tired.  She reports subjectively that her symptoms have improved on Plaquenil that was started at that time.  [name] also reports that she has some baseline proteinuria, but no significant manifestations of Lupus nephritis.  Thus far in her pregnancy, her blood pressures have been well controlled without medications and serial growth scans have been appropriate.  OB History: OB History   Grav Para Term Preterm Abortions TAB SAB Ect Mult Living   2    1  1          PMH:  Past Medical History  Diagnosis Date  . 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 HSV infection   . Asthma   . Lupus     dx age 56  . Headache(784.0)   . Hypertension     hx of elevations, never been on meds  . Infection     UTI  . Anxiety     doing good now    PSH:  Past Surgical History  Procedure Laterality Date  . Tonsillectomy and adenoidectomy  1998  . Mouth surgery      2 teeth removed- 1  wisdom and 1 in front of it  . Wisdom tooth extraction     Meds:  Current Outpatient Prescriptions on File Prior to Encounter  Medication Sig Dispense Refill  . albuterol (PROVENTIL HFA;VENTOLIN HFA) 108 (90 BASE) MCG/ACT inhaler Inhale 1-2 puffs into the lungs every 6 (six) hours as needed. For shortness of breath  3.7 g  2  . calcium carbonate (TUMS - DOSED IN MG ELEMENTAL CALCIUM) 500 MG chewable tablet Chew 2 tablets by mouth daily as needed for heartburn.      . dexamethasone (DECADRON) 4 MG tablet Take 4 mg by mouth daily.      . diphenhydramine-acetaminophen (TYLENOL PM) 25-500 MG TABS Take 2 tablets by mouth daily as needed (sleep).      . docusate sodium 100 MG CAPS Take 100 mg by mouth daily.  10 capsule  0  . hydroxychloroquine (PLAQUENIL) 200 MG tablet Take 100 mg by mouth daily.      Marland Kitchen NIFEdipine (PROCARDIA) 10 MG capsule Take 2 capsules (20 mg total) by mouth every 6 (six) hours.  30 capsule  0  . Prenatal Vit-Fe Fumarate-FA (PRENATAL MULTIVITAMIN) TABS Take 1 tablet by mouth daily.      Marland Kitchen zolpidem (AMBIEN) 5 MG tablet Take 1 tablet (5 mg total) by mouth at bedtime as needed for sleep.  30 tablet  0   No current facility-administered medications on file prior to encounter.   Allergies:  Allergies  Allergen Reactions  . Bactrim Anaphylaxis and Hives  . Prednisone Anaphylaxis and Hives  . Sulfamethoxazole-Tmp Ds Anaphylaxis  . Other Other (See Comments)    Shrimp:throat itching "can eat other shellfish"  . Pineapple Itching   FH:  Family History  Problem Relation Age of Onset  . Diabetes Maternal Aunt   . Cancer Maternal Grandmother 72    COLON CA  . Diabetes Maternal Grandmother   . Hypertension Maternal Grandfather   . Cancer Maternal Grandfather     prostate  . Asthma Mother   . Asthma Brother   . Anesthesia problems Neg Hx   . Hypotension Neg Hx   . Malignant hyperthermia Neg Hx   . Pseudochol deficiency Neg Hx   . Cancer Paternal Grandmother     breast    Soc:  History  Smoking status  . Never Smoker   Smokeless tobacco  . Never Used   History  Alcohol Use No    Comment: not with preg    Review of Systems: no vaginal bleeding or cramping/contractions, no LOF, no nausea/vomiting. All other systems reviewed and are negative.  PE: 138/70, 109,18   Labs:  CBC    Component Value Date/Time   WBC 6.8 08/12/2013 1150   RBC 3.90 08/12/2013 1150   HGB 11.2* 08/12/2013 1150   HCT 33.5* 08/12/2013 1150   PLT 231 08/12/2013 1150   MCV 85.9 08/12/2013 1150   MCH 28.7 08/12/2013 1150   MCHC 33.4 08/12/2013 1150   RDW 14.1 08/12/2013 1150   LYMPHSABS 2.1 11/25/2012 2310   MONOABS 0.4 11/25/2012 2310   EOSABS 0.3 11/25/2012 2310   BASOSABS 0.0 11/25/2012 2310   C3 (12/09) - 165 (90-180) C4 (12/09) - 39 (10-40)   A/P: 1) Single IUP at 28 2/7 weeks         2) Preterm contractions - feel that risk of preterm delivery presently is low given a negative FFN and normal cervical length.  She is currently on Procardia for preterm contractions         3) SLE on Plaquenil          4) First degree fetal heart block - improved s/p single course of IVIG and po Dexamethasone  Recommendations  1) Feel that the patient is stable regarding preterm delivery standpoint and may be discharged - completed a course of betamethasone on 1/2 and 1/3 2) Recommend continued serial growth scans every 4 weeks due to SLE - please contact our office if you would prefer that these procedures be performed in our office 3) Antepartum fetal testing beginning at 32 week; if testing is otherwise reassuring, would recommend delivery at [redacted] weeks gestation. 4) Concur with checking C3/C4 values today - were minimally suppressed in December - this can be used to monitor lupus activity and my be useful in distinguishing SLE exacerbation compared to preeclampsia.  Recommend continued follow up with Rheumatology.  Would anticipate some benefit in lupus activity with the addition of po  dexamethasone for first degree fetal heart block. 5) Continued close follow up with Peds cardiology - if dexamethasone is continued for the remainder of pregnancy, would check (or repeat) glucose tolerance testing as she may be at higher risk for glucose intolerance.   Thank you for the opportunity to be a part of the care of Marin Wisner. Please contact our office if we  can be of further assistance.   I spent approximately 30 minutes with this patient with over 50% of time spent in face-to-face counseling.  Alpha GulaPaul Tanay Massiah, MD Maternal-Fetal Medicine

## 2013-08-13 NOTE — Discharge Summary (Signed)
  Obstetric Discharge Summary Reason for Admission: preterm labor Prenatal Procedures: NST and ultrasound Intrapartum Procedures: na Postpartum Procedures: none Complications-Operative and Postpartum: none Hemoglobin  Date Value Range Status  08/12/2013 11.2* 12.0 - 15.0 g/dL Final     HCT  Date Value Range Status  08/12/2013 33.5* 36.0 - 46.0 % Final    Physical Exam:  Physical Examination: General appearance - alert, well appearing, and in no distress Mental status - alert, oriented to person, place, and time Chest - clear to auscultation, no wheezes, rales or rhonchi, symmetric air entry Heart - normal rate and regular rhythm Abdomen - soft, nontender, nondistended, no masses or organomegaly gravid Extremities - Homan's sign negative bilaterally  Pt was admitted for contractions and dilation of 1 cm.  She was given procardia and IVF.  She had a limited US which demonstrated normal fluid and vtx.  She will fu with MFM in one week. She has an appt in our office with growth US in 2 weeks.  We will refer pt to rheumatology per her request to change her current physician.  She will go home on modified bed rest and procardia with preterm labor precautions   Discharge Diagnoses: Premature labor  Discharge Information: Date: 08/13/2013 Activity: pelvic rest and modified bed rest Diet: routine Medications: PNV and Colace Condition: stable Instructions: refer to practice specific booklet Discharge to: home Follow-up Information   [facility] On 08/28/2013. (at 11:00 am for US and visit at 1130)    Specialty:  Obstetrics and Gynecology   Contact information:   [address] [place] [id number] [phone]      Newborn Data: <<This patient has not yet delivered during this pregnancy.>> Home with pt still pregnant.  Hayley Webb A 08/13/2013, 2:30 PM

## 2013-08-13 NOTE — Progress Notes (Signed)
Ur chart reviewed completed.

## 2013-08-13 NOTE — Progress Notes (Signed)
Pt. Is stable and ready to be discharged. All belongings are with the patient. Discharge instructions and medications reviewed. All questions answered. Pt. Verbalizes understanding of modified bedrest and will f/u with MD Feb. 5. Pt. Wheeled out via wheelchair to [employer].

## 2013-08-13 NOTE — Progress Notes (Signed)
  [facility] and pt feeling like she needs to push after going to the bathroom.  Pt reports cramping is slightly worse right now.     Objective: BP 107/65  Pulse 92  Temp(Src) 98.2 F (36.8 C) (Oral)  Resp 18  Ht 5\' 9"  (1.753 m)  Wt 306 lb (138.801 kg)  BMI 45.17 kg/m2  SpO2 98%  LMP 01/20/2013 I/O last 3 completed shifts: In: 1615.8 [P.O.:720; I.V.:895.8] Out: 650 [Urine:650]    FHT:  BL 135  UC:   Uterine irritability noted on toco but no UCs, pt reports cramping q 2-3 min  SVE:   Dilation: 1 Effacement (%): 50 Station: -3 Exam by:: [employer]ennifer Berlin Mokry  Assessment / Plan: IUP at 8263w2d Admitted for PT uterine activity Continues to cramp in lower abdomen and back Lupus On Procardia 10 mg Q 6 hr  SVE BS u/s for presentation-vertex C/w Dr. Su Hiltoberts - Procardia 10 mg dose now   Merie Wulf 08/13/2013, 1:22 AM

## 2013-08-14 ENCOUNTER — Inpatient Hospital Stay (HOSPITAL_COMMUNITY)
Admission: AD | Admit: 2013-08-14 | Discharge: 2013-08-15 | Disposition: A | Discharge status: Home / Self Care | Payer: BC Managed Care – PPO | Source: Ambulatory Visit | Attending: Obstetrics and Gynecology | Admitting: Obstetrics and Gynecology

## 2013-08-14 ENCOUNTER — Encounter (HOSPITAL_COMMUNITY): Payer: Self-pay | Admitting: *Deleted

## 2013-08-14 DIAGNOSIS — M329 Systemic lupus erythematosus, unspecified: Secondary | ICD-10-CM | POA: Insufficient documentation

## 2013-08-14 DIAGNOSIS — I2510 Atherosclerotic heart disease of native coronary artery without angina pectoris: Secondary | ICD-10-CM | POA: Insufficient documentation

## 2013-08-14 DIAGNOSIS — O99419 Diseases of the circulatory system complicating pregnancy, unspecified trimester: Secondary | ICD-10-CM

## 2013-08-14 DIAGNOSIS — I440 Atrioventricular block, first degree: Secondary | ICD-10-CM | POA: Insufficient documentation

## 2013-08-14 DIAGNOSIS — O4700 False labor before 37 completed weeks of gestation, unspecified trimester: Secondary | ICD-10-CM | POA: Insufficient documentation

## 2013-08-14 LAB — EXTRACTABLE NUCLEAR ANTIGEN ANTIBODY
ENA SM AB SER-ACNC: NEGATIVE
SM/RNP: NEGATIVE
SSA (Ro) (ENA) Antibody, IgG: >8
SSB (La) (ENA) Antibody, IgG: 1.1
Scleroderma (Scl-70) (ENA) Antibody, IgG: <1
ds DNA Ab: <1 IU/mL

## 2013-08-14 LAB — CULTURE, BETA STREP (GROUP B ONLY)

## 2013-08-14 LAB — C4 COMPLEMENT: Complement C4, Body Fluid: 39 mg/dL (ref 10–40)

## 2013-08-14 LAB — C3 COMPLEMENT: C3 COMPLEMENT: 174 mg/dL (ref 90–180)

## 2013-08-14 MED ORDER — LACTATED RINGERS IV SOLN: INTRAVENOUS | Status: DC

## 2013-08-14 MED ORDER — NIFEDIPINE 10 MG PO CAPS
20.0000 mg | ORAL_CAPSULE | Freq: Once | ORAL | Status: AC
  Administered 2013-08-14: 20 mg via ORAL
  Filled 2013-08-14: qty 2

## 2013-08-14 MED ORDER — LACTATED RINGERS IV BOLUS (SEPSIS)
500.0000 mL | Freq: Once | INTRAVENOUS | Status: AC
  Administered 2013-08-14: 1000 mL via INTRAVENOUS

## 2013-08-14 NOTE — MAU Note (Signed)
Pt G2 P0, at 28.3wks having contractions since this am and clear watery discharge at 1630.  Pt discharged from antenatal 08/13/2013, SVE 1cm.

## 2013-08-14 NOTE — MAU Note (Signed)
Pt discharged home from Antenatal yesterday. Pt states she has been having contractions Pt placed on the monitor.Pt states she has been having contractions since last night.Pt states she have gotten worse

## 2013-08-14 NOTE — MAU Provider Note (Signed)
History   27yo, G2P0010 at 3166w3d presents with UCs about Q 5 min since about 1200 today.  Reports UCs since last night but became more intense at 1200.  Recent Antenatal admission.  Discharged on 08/13/13 on modified bedrest with Procardia 20 mg Q 6 hours.  Pregnancy is complicated by SLE with SSB antibodies and 1st degree fetal heart block.  MFM consult on 1/21.  Recent labs: 1/20 FFN = Neg GC/CT = Neg/Neg Wet prep = Neg GBS = pos 1/21 Extractable Nuclear antigen ab Complement, total C4 Complement C3 Complement C2 Complement  Chief Complaint  Patient presents with  . Contractions   HPI  OB History   Grav Para Term Preterm Abortions TAB SAB Ect Mult Living   2    1  1          Past Medical History  Diagnosis Date  . 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 HSV infection   . Asthma   . Lupus     dx age 28  . Headache(784.0)   . Hypertension     hx of elevations, never been on meds  . Infection     UTI  . Anxiety     doing good now    Past Surgical History  Procedure Laterality Date  . Tonsillectomy and adenoidectomy  1998  . Mouth surgery      2 teeth removed- 1 wisdom and 1 in front of it  . Wisdom tooth extraction      Family History  Problem Relation Age of Onset  . Diabetes Maternal Aunt   . Cancer Maternal Grandmother 72    COLON CA  . Diabetes Maternal Grandmother   . Hypertension Maternal Grandfather   . Cancer Maternal Grandfather     prostate  . Asthma Mother   . Asthma Brother   . Anesthesia problems Neg Hx   . Hypotension Neg Hx   . Malignant hyperthermia Neg Hx   . Pseudochol deficiency Neg Hx   . Cancer Paternal Grandmother     breast    History  Substance Use Topics  . Smoking status: Never Smoker   . Smokeless tobacco: Never Used  . Alcohol Use: No     Comment: not with preg    Allergies:  Allergies  Allergen Reactions  . Bactrim Anaphylaxis and Hives  . Prednisone Anaphylaxis and Hives  . Other  Itching    Shrimp:throat itching "can eat other shellfish"  . Pineapple Itching    Prescriptions prior to admission  Medication Sig Dispense Refill  . albuterol (PROVENTIL HFA;VENTOLIN HFA) 108 (90 BASE) MCG/ACT inhaler Inhale 1-2 puffs into the lungs every 6 (six) hours as needed for wheezing or shortness of breath.      . calcium carbonate (TUMS - DOSED IN MG ELEMENTAL CALCIUM) 500 MG chewable tablet Chew 2 tablets by mouth 3 (three) times daily as needed for indigestion or heartburn.      . dexamethasone (DECADRON) 4 MG tablet Take 4 mg by mouth daily.      . diphenhydramine-acetaminophen (TYLENOL PM) 25-500 MG TABS Take 2 tablets by mouth daily as needed (for sleep).       . docusate sodium 100 MG CAPS Take 100 mg by mouth daily.  10 capsule  0  . Hydroxychloroquine Sulfate (PLAQUENIL PO) Take 100 mg by mouth daily.      Marland Kitchen. NIFEdipine (PROCARDIA) 10 MG capsule Take 2 capsules (20 mg total) by mouth every 6 (  six) hours.  30 capsule  0  . Prenatal Vit-Fe Fumarate-FA (PRENATAL MULTIVITAMIN) TABS Take 1 tablet by mouth daily.        ROS ROS: see HPI above, all other systems are negative  Physical Exam   Blood pressure 133/78, pulse 103, temperature 98 F (36.7 C), temperature source Oral, resp. rate 18, height 5\' 9"  (1.753 m), weight 308 lb 9.6 oz (139.98 kg), last menstrual period 01/20/2013.  Physical Exam Chest: Clear Heart: RRR Abdomen: gravid, NT Extremities: WNL  Dilation: 1 Effacement (%): 30 Cervical Position: Middle Station: -2 Presentation: Vertex Exam by:: Haroldine Laws [name]  Dilation: Fingertip Effacement (%): 30 Cervical Position: Middle Station: -2;-3 Presentation: Vertex Exam by:: J Hardy Harcum [name]  FHT: Reassuring for GA UCs: Rare  ED Course  IUP at [redacted]w[redacted]d PTL evaluation  IVF Scheduled dose of Procardia due while in MAU, dose given  No cervical change while in MAU  F/u in office scheduled 2/5, encouraged pt to call office and see if she could be seen  sooner.     Haroldine Laws [name], MSN 08/14/2013 8:33 PM

## 2013-08-14 NOTE — Discharge Instructions (Signed)
Preterm Labor Information Preterm labor is when labor starts at less than 37 weeks of pregnancy. The normal length of a pregnancy is 39 to 41 weeks. CAUSES Often, there is no identifiable underlying cause as to why a woman goes into preterm labor. One of the most common known causes of preterm labor is infection. Infections of the uterus, cervix, vagina, amniotic sac, bladder, kidney, or even the lungs (pneumonia) can cause labor to start. Other suspected causes of preterm labor include:   Urogenital infections, such as yeast infections and bacterial vaginosis.   Uterine abnormalities (uterine shape, uterine septum, fibroids, or bleeding from the placenta).   A cervix that has been operated on (it may fail to stay closed).   Malformations in the fetus.   Multiple gestations (twins, triplets, and so on).   Breakage of the amniotic sac.  RISK FACTORS  Having a previous history of preterm labor.   Having premature rupture of membranes (PROM).   Having a placenta that covers the opening of the cervix (placenta previa).   Having a placenta that separates from the uterus (placental abruption).   Having a cervix that is too weak to hold the fetus in the uterus (incompetent cervix).   Having too much fluid in the amniotic sac (polyhydramnios).   Taking illegal drugs or smoking while pregnant.   Not gaining enough weight while pregnant.   Being younger than 18 and older than 26 years old.   Having a low socioeconomic status.   Being African American. SYMPTOMS Signs and symptoms of preterm labor include:   Menstrual-like cramps, abdominal pain, or back pain.  Uterine contractions that are regular, as frequent as six in an hour, regardless of their intensity (may be mild or painful).  Contractions that start on the top of the uterus and spread down to the lower abdomen and back.   A sense of increased pelvic pressure.   A watery or bloody mucus discharge that  comes from the vagina.  TREATMENT Depending on the length of the pregnancy and other circumstances, your health care provider may suggest bed rest. If necessary, there are medicines that can be given to stop contractions and to mature the fetal lungs. If labor happens before 34 weeks of pregnancy, a prolonged hospital stay may be recommended. Treatment depends on the condition of both you and the fetus.  WHAT SHOULD YOU DO IF YOU THINK YOU ARE IN PRETERM LABOR? Call your health care provider right away. You will need to go to the hospital to get checked immediately. HOW CAN YOU PREVENT PRETERM LABOR IN FUTURE PREGNANCIES? You should:   Stop smoking if you smoke.  Maintain healthy weight gain and avoid chemicals and drugs that are not necessary.  Be watchful for any type of infection.  Inform your health care provider if you have a known history of preterm labor. Document Released: 09/30/2003 Document Revised: 03/12/2013 Document Reviewed: 08/12/2012 [employer] Patient Information 2014 [employer].    

## 2013-08-15 LAB — COMPLEMENT, TOTAL: Compl, Total (CH50): >60 U/mL — ABNORMAL HIGH (ref 31–60)

## 2013-08-18 LAB — C2 COMPLEMENT: C2 COMPLEMENT: 3.4 mg/dL (ref 1.6–3.5)

## 2013-09-09 ENCOUNTER — Other Ambulatory Visit: Payer: Self-pay | Admitting: Obstetrics and Gynecology

## 2013-09-09 DIAGNOSIS — O35BXX0 Maternal care for other (suspected) fetal abnormality and damage, fetal cardiac anomalies, not applicable or unspecified: Secondary | ICD-10-CM

## 2013-09-09 DIAGNOSIS — O9989 Other specified diseases and conditions complicating pregnancy, childbirth and the puerperium: Principal | ICD-10-CM

## 2013-09-09 DIAGNOSIS — O358XX0 Maternal care for other (suspected) fetal abnormality and damage, not applicable or unspecified: Secondary | ICD-10-CM

## 2013-09-09 DIAGNOSIS — M329 Systemic lupus erythematosus, unspecified: Secondary | ICD-10-CM

## 2013-09-10 ENCOUNTER — Ambulatory Visit (HOSPITAL_COMMUNITY)
Admission: RE | Admit: 2013-09-10 | Discharge: 2013-09-10 | Disposition: A | Discharge status: Home / Self Care | Payer: Medicaid Other | Attending: Obstetrics and Gynecology | Admitting: Obstetrics and Gynecology

## 2013-09-10 ENCOUNTER — Other Ambulatory Visit: Payer: Self-pay | Admitting: Obstetrics and Gynecology

## 2013-09-10 VITALS — BP 124/83 | HR 92 | Wt 311.0 lb

## 2013-09-10 DIAGNOSIS — O9989 Other specified diseases and conditions complicating pregnancy, childbirth and the puerperium: Principal | ICD-10-CM

## 2013-09-10 DIAGNOSIS — A6000 Herpesviral infection of urogenital system, unspecified: Secondary | ICD-10-CM | POA: Insufficient documentation

## 2013-09-10 DIAGNOSIS — O358XX0 Maternal care for other (suspected) fetal abnormality and damage, not applicable or unspecified: Secondary | ICD-10-CM

## 2013-09-10 DIAGNOSIS — O10019 Pre-existing essential hypertension complicating pregnancy, unspecified trimester: Secondary | ICD-10-CM | POA: Insufficient documentation

## 2013-09-10 DIAGNOSIS — O99210 Obesity complicating pregnancy, unspecified trimester: Secondary | ICD-10-CM

## 2013-09-10 DIAGNOSIS — O35BXX0 Maternal care for other (suspected) fetal abnormality and damage, fetal cardiac anomalies, not applicable or unspecified: Secondary | ICD-10-CM

## 2013-09-10 DIAGNOSIS — IMO0002 Reserved for concepts with insufficient information to code with codable children: Secondary | ICD-10-CM

## 2013-09-10 DIAGNOSIS — O98519 Other viral diseases complicating pregnancy, unspecified trimester: Secondary | ICD-10-CM | POA: Insufficient documentation

## 2013-09-10 DIAGNOSIS — O368390 Maternal care for abnormalities of the fetal heart rate or rhythm, unspecified trimester, not applicable or unspecified: Secondary | ICD-10-CM | POA: Insufficient documentation

## 2013-09-10 DIAGNOSIS — E669 Obesity, unspecified: Secondary | ICD-10-CM | POA: Insufficient documentation

## 2013-09-10 DIAGNOSIS — O99891 Other specified diseases and conditions complicating pregnancy: Secondary | ICD-10-CM | POA: Insufficient documentation

## 2013-09-10 DIAGNOSIS — J45909 Unspecified asthma, uncomplicated: Secondary | ICD-10-CM | POA: Insufficient documentation

## 2013-09-10 DIAGNOSIS — M329 Systemic lupus erythematosus, unspecified: Secondary | ICD-10-CM

## 2013-09-20 ENCOUNTER — Encounter (HOSPITAL_COMMUNITY): Payer: Self-pay | Admitting: *Deleted

## 2013-09-20 ENCOUNTER — Inpatient Hospital Stay (HOSPITAL_COMMUNITY)
Admission: AD | Admit: 2013-09-20 | Discharge: 2013-09-20 | Disposition: A | Discharge status: Home / Self Care | Payer: Medicaid Other | Source: Ambulatory Visit | Attending: Obstetrics and Gynecology | Admitting: Obstetrics and Gynecology

## 2013-09-20 DIAGNOSIS — M329 Systemic lupus erythematosus, unspecified: Secondary | ICD-10-CM | POA: Insufficient documentation

## 2013-09-20 DIAGNOSIS — O4700 False labor before 37 completed weeks of gestation, unspecified trimester: Secondary | ICD-10-CM | POA: Insufficient documentation

## 2013-09-20 DIAGNOSIS — J45909 Unspecified asthma, uncomplicated: Secondary | ICD-10-CM | POA: Insufficient documentation

## 2013-09-20 LAB — URINALYSIS, ROUTINE W REFLEX MICROSCOPIC
BILIRUBIN URINE: NEGATIVE
GLUCOSE, UA: NEGATIVE mg/dL
HGB URINE DIPSTICK: NEGATIVE
KETONES UR: NEGATIVE mg/dL
LEUKOCYTES UA: NEGATIVE
Nitrite: NEGATIVE
PROTEIN: NEGATIVE mg/dL
Specific Gravity, Urine: 1.02 (ref 1.005–1.030)
Urobilinogen, UA: 0.2 mg/dL (ref 0.0–1.0)
pH: 6.5 (ref 5.0–8.0)

## 2013-09-20 LAB — WET PREP, GENITAL
Clue Cells Wet Prep HPF POC: NONE SEEN
Trich, Wet Prep: NONE SEEN
Yeast Wet Prep HPF POC: NONE SEEN

## 2013-09-20 LAB — OB RESULTS CONSOLE GBS: STREP GROUP B AG: NEGATIVE

## 2013-09-20 LAB — FETAL FIBRONECTIN: FETAL FIBRONECTIN: NEGATIVE

## 2013-09-20 MED ORDER — NIFEDIPINE 10 MG PO CAPS: 10.0000 mg | ORAL_CAPSULE | Freq: Three times a day (TID) | ORAL | Status: DC

## 2013-09-20 MED ORDER — NIFEDIPINE 10 MG PO CAPS
10.0000 mg | ORAL_CAPSULE | ORAL | Status: DC | PRN
  Administered 2013-09-20 (×2): 10 mg via ORAL
  Filled 2013-09-20 (×2): qty 1

## 2013-09-20 NOTE — MAU Note (Signed)
I've been having contractions and take procardia. Took Procardia 2hrs ago and did not help. Baby has first degree heartblock. I'm 1cm

## 2013-09-20 NOTE — MAU Provider Note (Signed)
History     CSN: [id number]  Arrival date and time: 09/20/13 [id number]   First Provider Initiated Contact with Patient 09/20/13 2113      Chief Complaint  Patient presents with  . Contractions   HPI Comments: Pt is a G2P0 at 6165w5d, arrives unannounced w c/o ctx since yesterday, now more regular and painful. Denies any VB or LOF, reports GFM, c/o some discharge. Has been on BR since 27wks for PTL, states she's only taken procardia once today, hasn't taken it for a few days.        Past Medical History  Diagnosis Date  . 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 HSV infection   . Asthma   . Lupus     dx age 28  . Headache(784.0)   . Hypertension     hx of elevations, never been on meds  . Infection     UTI  . Anxiety     doing good now    Past Surgical History  Procedure Laterality Date  . Tonsillectomy and adenoidectomy  1998  . Mouth surgery      2 teeth removed- 1 wisdom and 1 in front of it  . Wisdom tooth extraction      Family History  Problem Relation Age of Onset  . Diabetes Maternal Aunt   . Cancer Maternal Grandmother 72    COLON CA  . Diabetes Maternal Grandmother   . Hypertension Maternal Grandfather   . Cancer Maternal Grandfather     prostate  . Asthma Mother   . Asthma Brother   . Anesthesia problems Neg Hx   . Hypotension Neg Hx   . Malignant hyperthermia Neg Hx   . Pseudochol deficiency Neg Hx   . Cancer Paternal Grandmother     breast    History  Substance Use Topics  . Smoking status: Never Smoker   . Smokeless tobacco: Never Used  . Alcohol Use: No     Comment: not with preg    Allergies:  Allergies  Allergen Reactions  . Bactrim Anaphylaxis and Hives  . Prednisone Anaphylaxis and Hives  . Other Itching    Shrimp:throat itching "can eat other shellfish"  . Pineapple Itching    Prescriptions prior to admission  Medication Sig Dispense Refill  . albuterol (PROVENTIL HFA;VENTOLIN HFA) 108 (90 BASE)  MCG/ACT inhaler Inhale 1-2 puffs into the lungs every 6 (six) hours as needed for wheezing or shortness of breath.      . calcium carbonate (TUMS - DOSED IN MG ELEMENTAL CALCIUM) 500 MG chewable tablet Chew 2 tablets by mouth 3 (three) times daily as needed for indigestion or heartburn.      . dexamethasone (DECADRON) 4 MG tablet Take 4 mg by mouth daily.      Marland Kitchen. docusate sodium 100 MG CAPS Take 100 mg by mouth daily.  10 capsule  0  . HYDROcodone-acetaminophen (NORCO/VICODIN) 5-325 MG per tablet Take 1 tablet by mouth every 6 (six) hours as needed for moderate pain.      Marland Kitchen. Hydroxychloroquine Sulfate (PLAQUENIL PO) Take 100 mg by mouth daily.      Marland Kitchen. NIFEdipine (PROCARDIA) 10 MG capsule Take 2 capsules (20 mg total) by mouth every 6 (six) hours.  30 capsule  0  . Prenatal Vit-Fe Fumarate-FA (PRENATAL MULTIVITAMIN) TABS Take 1 tablet by mouth daily.        Review of Systems  All other systems reviewed and are negative.  Physical Exam   Blood pressure 129/81, pulse 115, temperature 98 F (36.7 C), resp. rate 20, last menstrual period 01/20/2013, SpO2 98.00%.  Physical Exam  Nursing note and vitals reviewed. Constitutional: She is oriented to person, place, and time. She appears well-developed and well-nourished.  HENT:  Head: Normocephalic.  Eyes: Pupils are equal, round, and reactive to light.  Neck: Normal range of motion.  Cardiovascular:  Tachycardia noted  Respiratory: Effort normal.  GI: Soft. She exhibits no distension. There is no tenderness.  Genitourinary: Vagina normal.  Spec exam, sm amt d/c,  VE FT/th/high   Musculoskeletal: Normal range of motion.  Neurological: She is alert and oriented to person, place, and time. She has normal reflexes.  Skin: Skin is warm and dry.  Psychiatric: She has a normal mood and affect. Her behavior is normal.    MAU Course  Procedures   Assessment and Plan  IUP at [redacted]w[redacted]d FHR cat 1 toco - none FFN neg Wet prep neg GBS sent UA  clear  Pt given 2 doses of procardia Feels better dc'd home in stable condition rv'd [facility]KC and labor F/u office routine    Juliyah Mergen M 09/20/2013, 9:18 PM

## 2013-09-20 NOTE — MAU Note (Signed)
Pt called out and stated felt SOB and like may pass out. Pt was alittle flat in bed. HOB up and helped to sit up more. Felt better after sitting up.

## 2013-09-20 NOTE — Discharge Instructions (Signed)
Fetal Movement Counts °Patient Name: __________________________________________________ Patient Due Date: ____________________ °Performing a fetal movement count is highly recommended in high-risk pregnancies, but it is good for every pregnant woman to do. Your caregiver may ask you to start counting fetal movements at 28 weeks of the pregnancy. Fetal movements often increase: °· After eating a full meal. °· After physical activity. °· After eating or drinking something sweet or cold. °· At rest. °Pay attention to when you feel the baby is most active. This will help you notice a pattern of your baby's sleep and wake cycles and what factors contribute to an increase in fetal movement. It is important to perform a fetal movement count at the same time each day when your baby is normally most active.  °HOW TO COUNT FETAL MOVEMENTS °1. Find a quiet and comfortable area to sit or lie down on your left side. Lying on your left side provides the best blood and oxygen circulation to your baby. °2. Write down the day and time on a sheet of paper or in a journal. °3. Start counting kicks, flutters, swishes, rolls, or jabs in a 2 hour period. You should feel at least 10 movements within 2 hours. °4. If you do not feel 10 movements in 2 hours, wait 2 3 hours and count again. Look for a change in the pattern or not enough counts in 2 hours. °SEEK MEDICAL CARE IF: °· You feel less than 10 counts in 2 hours, tried twice. °· There is no movement in over an hour. °· The pattern is changing or taking longer each day to reach 10 counts in 2 hours. °· You feel the baby is not moving as he or she usually does. °Date: ____________ Movements: ____________ Start time: ____________ Finish time: ____________  °Date: ____________ Movements: ____________ Start time: ____________ Finish time: ____________ °Date: ____________ Movements: ____________ Start time: ____________ Finish time: ____________ °Date: ____________ Movements: ____________  Start time: ____________ Finish time: ____________ °Date: ____________ Movements: ____________ Start time: ____________ Finish time: ____________ °Date: ____________ Movements: ____________ Start time: ____________ Finish time: ____________ °Date: ____________ Movements: ____________ Start time: ____________ Finish time: ____________ °Date: ____________ Movements: ____________ Start time: ____________ Finish time: ____________  °Date: ____________ Movements: ____________ Start time: ____________ Finish time: ____________ °Date: ____________ Movements: ____________ Start time: ____________ Finish time: ____________ °Date: ____________ Movements: ____________ Start time: ____________ Finish time: ____________ °Date: ____________ Movements: ____________ Start time: ____________ Finish time: ____________ °Date: ____________ Movements: ____________ Start time: ____________ Finish time: ____________ °Date: ____________ Movements: ____________ Start time: ____________ Finish time: ____________ °Date: ____________ Movements: ____________ Start time: ____________ Finish time: ____________  °Date: ____________ Movements: ____________ Start time: ____________ Finish time: ____________ °Date: ____________ Movements: ____________ Start time: ____________ Finish time: ____________ °Date: ____________ Movements: ____________ Start time: ____________ Finish time: ____________ °Date: ____________ Movements: ____________ Start time: ____________ Finish time: ____________ °Date: ____________ Movements: ____________ Start time: ____________ Finish time: ____________ °Date: ____________ Movements: ____________ Start time: ____________ Finish time: ____________ °Date: ____________ Movements: ____________ Start time: ____________ Finish time: ____________  °Date: ____________ Movements: ____________ Start time: ____________ Finish time: ____________ °Date: ____________ Movements: ____________ Start time: ____________ Finish time:  ____________ °Date: ____________ Movements: ____________ Start time: ____________ Finish time: ____________ °Date: ____________ Movements: ____________ Start time: ____________ Finish time: ____________ °Date: ____________ Movements: ____________ Start time: ____________ Finish time: ____________ °Date: ____________ Movements: ____________ Start time: ____________ Finish time: ____________ °Date: ____________ Movements: ____________ Start time: ____________ Finish time: ____________  °Date: ____________ Movements: ____________ Start time: ____________ Finish   time: ____________ °Date: ____________ Movements: ____________ Start time: ____________ Finish time: ____________ °Date: ____________ Movements: ____________ Start time: ____________ Finish time: ____________ °Date: ____________ Movements: ____________ Start time: ____________ Finish time: ____________ °Date: ____________ Movements: ____________ Start time: ____________ Finish time: ____________ °Date: ____________ Movements: ____________ Start time: ____________ Finish time: ____________ °Date: ____________ Movements: ____________ Start time: ____________ Finish time: ____________  °Date: ____________ Movements: ____________ Start time: ____________ Finish time: ____________ °Date: ____________ Movements: ____________ Start time: ____________ Finish time: ____________ °Date: ____________ Movements: ____________ Start time: ____________ Finish time: ____________ °Date: ____________ Movements: ____________ Start time: ____________ Finish time: ____________ °Date: ____________ Movements: ____________ Start time: ____________ Finish time: ____________ °Date: ____________ Movements: ____________ Start time: ____________ Finish time: ____________ °Date: ____________ Movements: ____________ Start time: ____________ Finish time: ____________  °Date: ____________ Movements: ____________ Start time: ____________ Finish time: ____________ °Date: ____________ Movements:  ____________ Start time: ____________ Finish time: ____________ °Date: ____________ Movements: ____________ Start time: ____________ Finish time: ____________ °Date: ____________ Movements: ____________ Start time: ____________ Finish time: ____________ °Date: ____________ Movements: ____________ Start time: ____________ Finish time: ____________ °Date: ____________ Movements: ____________ Start time: ____________ Finish time: ____________ °Date: ____________ Movements: ____________ Start time: ____________ Finish time: ____________  °Date: ____________ Movements: ____________ Start time: ____________ Finish time: ____________ °Date: ____________ Movements: ____________ Start time: ____________ Finish time: ____________ °Date: ____________ Movements: ____________ Start time: ____________ Finish time: ____________ °Date: ____________ Movements: ____________ Start time: ____________ Finish time: ____________ °Date: ____________ Movements: ____________ Start time: ____________ Finish time: ____________ °Date: ____________ Movements: ____________ Start time: ____________ Finish time: ____________ °Document Released: 08/09/2006 Document Revised: 06/26/2012 Document Reviewed: 05/06/2012 °[employer]® Patient Information ©2014 [employer]. ° °Preterm Labor Information °Preterm labor is when labor starts at less than 37 weeks of pregnancy. The normal length of a pregnancy is 39 to 41 weeks. °CAUSES °Often, there is no identifiable underlying cause as to why a woman goes into preterm labor. One of the most common known causes of preterm labor is infection. Infections of the uterus, cervix, vagina, amniotic sac, bladder, kidney, or even the lungs (pneumonia) can cause labor to start. Other suspected causes of preterm labor include:  °· Urogenital infections, such as yeast infections and bacterial vaginosis.   °· Uterine abnormalities (uterine shape, uterine septum, fibroids, or bleeding from the placenta).   °· A cervix that  has been operated on (it may fail to stay closed).   °· Malformations in the fetus.   °· Multiple gestations (twins, triplets, and so on).   °· Breakage of the amniotic sac.   °RISK FACTORS °· Having a previous history of preterm labor.   °· Having premature rupture of membranes (PROM).   °· Having a placenta that covers the opening of the cervix (placenta previa).   °· Having a placenta that separates from the uterus (placental abruption).   °· Having a cervix that is too weak to hold the fetus in the uterus (incompetent cervix).   °· Having too much fluid in the amniotic sac (polyhydramnios).   °· Taking illegal drugs or smoking while pregnant.   °· Not gaining enough weight while pregnant.   °· Being younger than 18 and older than 26 years old.   °· Having a low socioeconomic status.   °· Being African American. °SYMPTOMS °Signs and symptoms of preterm labor include:  °· Menstrual-like cramps, abdominal pain, or back pain. °· Uterine contractions that are regular, as frequent as six in an hour, regardless of their intensity (may be mild or painful). °· Contractions that start on the top of the uterus and spread down to the lower abdomen and   back.   °· A sense of increased pelvic pressure.   °· A watery or bloody mucus discharge that comes from the vagina.   °TREATMENT °Depending on the length of the pregnancy and other circumstances, your health care provider may suggest bed rest. If necessary, there are medicines that can be given to stop contractions and to mature the fetal lungs. If labor happens before 34 weeks of pregnancy, a prolonged hospital stay may be recommended. Treatment depends on the condition of both you and the fetus.  °WHAT SHOULD YOU DO IF YOU THINK YOU ARE IN PRETERM LABOR? °Call your health care provider right away. You will need to go to the hospital to get checked immediately. °HOW CAN YOU PREVENT PRETERM LABOR IN FUTURE PREGNANCIES? °You should:  °· Stop smoking if you smoke.  °· Maintain  healthy weight gain and avoid chemicals and drugs that are not necessary. °· Be watchful for any type of infection. °· Inform your health care provider if you have a known history of preterm labor. °Document Released: 09/30/2003 Document Revised: 03/12/2013 Document Reviewed: 08/12/2012 °[employer]® Patient Information ©2014 [employer]. ° °

## 2013-09-22 LAB — CULTURE, OB URINE

## 2013-09-22 LAB — GC/CHLAMYDIA PROBE AMP
CT Probe RNA: NEGATIVE
GC Probe RNA: NEGATIVE

## 2013-09-23 LAB — CULTURE, BETA STREP (GROUP B ONLY)

## 2013-09-24 ENCOUNTER — Other Ambulatory Visit: Payer: Self-pay | Admitting: Obstetrics and Gynecology

## 2013-09-24 ENCOUNTER — Encounter (HOSPITAL_COMMUNITY): Payer: Self-pay | Admitting: *Deleted

## 2013-09-24 ENCOUNTER — Inpatient Hospital Stay (HOSPITAL_COMMUNITY)
Admission: AD | Admit: 2013-09-24 | Discharge: 2013-09-24 | Disposition: A | Discharge status: Home / Self Care | Payer: Medicaid Other | Source: Ambulatory Visit | Attending: Obstetrics and Gynecology | Admitting: Obstetrics and Gynecology

## 2013-09-24 DIAGNOSIS — O358XX0 Maternal care for other (suspected) fetal abnormality and damage, not applicable or unspecified: Secondary | ICD-10-CM | POA: Insufficient documentation

## 2013-09-24 DIAGNOSIS — O4700 False labor before 37 completed weeks of gestation, unspecified trimester: Secondary | ICD-10-CM | POA: Insufficient documentation

## 2013-09-24 DIAGNOSIS — O212 Late vomiting of pregnancy: Secondary | ICD-10-CM | POA: Insufficient documentation

## 2013-09-24 LAB — URINALYSIS, ROUTINE W REFLEX MICROSCOPIC
Bilirubin Urine: NEGATIVE
Glucose, UA: NEGATIVE mg/dL
Hgb urine dipstick: NEGATIVE
KETONES UR: 15 mg/dL — AB
LEUKOCYTES UA: NEGATIVE
Nitrite: NEGATIVE
PROTEIN: NEGATIVE mg/dL
Specific Gravity, Urine: 1.015 (ref 1.005–1.030)
UROBILINOGEN UA: 0.2 mg/dL (ref 0.0–1.0)
pH: 6.5 (ref 5.0–8.0)

## 2013-09-24 MED ORDER — NIFEDIPINE 10 MG PO CAPS
10.0000 mg | ORAL_CAPSULE | Freq: Once | ORAL | Status: AC
  Administered 2013-09-24: 10 mg via ORAL
  Filled 2013-09-24: qty 1

## 2013-09-24 MED ORDER — ZOLPIDEM TARTRATE 5 MG PO TABS: 5.0000 mg | ORAL_TABLET | Freq: Every evening | ORAL | Status: DC | PRN

## 2013-09-24 MED ORDER — BUTORPHANOL TARTRATE 1 MG/ML IJ SOLN
2.0000 mg | Freq: Once | INTRAMUSCULAR | Status: AC
  Administered 2013-09-24: 2 mg via INTRAVENOUS
  Filled 2013-09-24: qty 2

## 2013-09-24 MED ORDER — LACTATED RINGERS IV BOLUS (SEPSIS)
500.0000 mL | Freq: Once | INTRAVENOUS | Status: AC
  Administered 2013-09-24: 1000 mL via INTRAVENOUS

## 2013-09-24 NOTE — Discharge Instructions (Signed)
Preterm Labor Information °Preterm labor is when labor starts before you are [redacted] weeks pregnant. The normal length of pregnancy is 39 to 41 weeks.  °CAUSES  °The cause of preterm labor is not often known. The most common known cause is infection. °RISK FACTORS °· Having a history of preterm labor. °· Having your water break before it should. °· Having a placenta that covers the opening of the cervix. °· Having a placenta that breaks away from the uterus. °· Having a cervix that is too weak to hold the baby in the uterus. °· Having too much fluid in the amniotic sac. °· Taking drugs or smoking while pregnant. °· Not gaining enough weight while pregnant. °· Being younger than 18 and older than 26 years old. °· Having a low income. °· Being African American. °SYMPTOMS °· Period-like cramps, belly (abdominal) pain, or back pain. °· Contractions that are regular, as often as six in an hour. They may be mild or painful. °· Contractions that start at the top of the belly. They then move to the lower belly and back. °· Lower belly pressure that seems to get stronger. °· Bleeding from the vagina. °· Fluid leaking from the vagina. °TREATMENT  °Treatment depends on: °· Your condition. °· The condition of your baby. °· How many weeks pregnant you are. °Your doctor may have you: °· Take medicine to stop contractions. °· Stay in bed except to use the restroom (bed rest). °· Stay in the hospital. °WHAT SHOULD YOU DO IF YOU THINK YOU ARE IN PRETERM LABOR? °Call your doctor right away. You need to go to the hospital right away.  °HOW CAN YOU PREVENT PRETERM LABOR IN FUTURE PREGNANCIES? °· Stop smoking, if you smoke. °· Maintain healthy weight gain. °· Do not take drugs or be around chemicals that are not needed. °· Tell your doctor if you think you have an infection. °· Tell your doctor if you had a preterm labor before. °Document Released: 10/06/2008 Document Revised: 04/30/2013 Document Reviewed: 10/06/2008 °[employer]® Patient  Information ©2014 [employer]. ° °

## 2013-09-24 NOTE — MAU Note (Addendum)
PT SAYS SHE STARTED HURTING BAD  AT 0530.   PT WAS HERE ON Sunday FOR UC- VE - 1/2 CM.   HAS HX OF HSV-  DENIES ANY OUTBREAK NOW- NOT TAKING  ANY MEDS.   DENIES MRSA.   TAKES PROCARDIA  Q4 HRS-  BUT PT SAYS  SHE TAKES IT WHEN SHE NEEDS ITS- BECAUSE IT DROPS HER BP -    LAST TAKEN WAS ON Monday AT 5PM-  DID NOT REALIZE UNTIL THIS AM  THAT SHE DID NOT HAVE ANYMORE TABS.

## 2013-09-24 NOTE — MAU Provider Note (Addendum)
History     CSN: [id number]  Arrival date and time: 09/24/13 [id number]   None     No chief complaint on file.  HPI Comments: Pt is G2P0 at 2884w2d arrives after calling [name] w c/o ctx since yesterday, now about 2-4 min apart and stronger, denies any VB or LOF. Reports GFM. Pt was seen on 2/28 and FFN was neg. Pt has been on BR and procardia since about 27wks, pt states she hasn't taken procardia since Monday because she doesn't like the way it makes her feel and now she states she's doesn't have anymore procardia at home.  C/o nausea, last ate at 4am, reports reg BM's.  Pt requesting pain meds, also asking if she can eat. States she took 1/2 vicodin around 2am, without much relief.      Past Medical History  Diagnosis Date  . 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 HSV infection   . Asthma   . Lupus     dx age 28  . Headache(784.0)   . Hypertension     hx of elevations, never been on meds  . Infection     UTI  . Anxiety     doing good now    Past Surgical History  Procedure Laterality Date  . Tonsillectomy and adenoidectomy  1998  . Mouth surgery      2 teeth removed- 1 wisdom and 1 in front of it  . Wisdom tooth extraction      Family History  Problem Relation Age of Onset  . Diabetes Maternal Aunt   . Cancer Maternal Grandmother 72    COLON CA  . Diabetes Maternal Grandmother   . Hypertension Maternal Grandfather   . Cancer Maternal Grandfather     prostate  . Asthma Mother   . Asthma Brother   . Anesthesia problems Neg Hx   . Hypotension Neg Hx   . Malignant hyperthermia Neg Hx   . Pseudochol deficiency Neg Hx   . Cancer Paternal Grandmother     breast    History  Substance Use Topics  . Smoking status: Never Smoker   . Smokeless tobacco: Never Used  . Alcohol Use: No     Comment: not with preg    Allergies:  Allergies  Allergen Reactions  . Bactrim Anaphylaxis and Hives  . Prednisone Anaphylaxis and Hives  . Other Itching   Shrimp:throat itching "can eat other shellfish"  . Pineapple Itching    Prescriptions prior to admission  Medication Sig Dispense Refill  . albuterol (PROVENTIL HFA;VENTOLIN HFA) 108 (90 BASE) MCG/ACT inhaler Inhale 1-2 puffs into the lungs every 6 (six) hours as needed for wheezing or shortness of breath.      . calcium carbonate (TUMS - DOSED IN MG ELEMENTAL CALCIUM) 500 MG chewable tablet Chew 2 tablets by mouth 3 (three) times daily as needed for indigestion or heartburn.      Marland Kitchen. HYDROcodone-acetaminophen (NORCO/VICODIN) 5-325 MG per tablet Take 1 tablet by mouth every 6 (six) hours as needed for moderate pain.      Marland Kitchen. Hydroxychloroquine Sulfate (PLAQUENIL PO) Take 100 mg by mouth daily.      Marland Kitchen. NIFEdipine (PROCARDIA) 10 MG capsule Take 2 capsules (20 mg total) by mouth every 6 (six) hours.  30 capsule  0  . Prenatal Vit-Fe Fumarate-FA (PRENATAL MULTIVITAMIN) TABS Take 1 tablet by mouth daily.      Marland Kitchen. docusate sodium 100 MG CAPS Take 100 mg by  mouth daily.  10 capsule  0    Review of Systems  Respiratory: Positive for shortness of breath.        When abdomen gets tight, feels more difficulty taking a deep breath  Cardiovascular: Negative for chest pain.  Gastrointestinal: Positive for nausea and abdominal pain.  Neurological: Positive for weakness.  Psychiatric/Behavioral: The patient is nervous/anxious.   All other systems reviewed and are negative.   Physical Exam   Blood pressure 123/81, pulse 123, temperature 97.5 F (36.4 C), temperature source Oral, resp. rate 20, height 5\' 8"  (1.727 m), weight 314 lb 2 oz (142.486 kg), last menstrual period 01/20/2013.  Physical Exam  Nursing note and vitals reviewed. Constitutional: She is oriented to person, place, and time. She appears well-developed and well-nourished. She appears distressed.  Wincing and appears in pain   HENT:  Head: Normocephalic.  Eyes: Pupils are equal, round, and reactive to light.  Neck: Normal range of motion.   Cardiovascular: Regular rhythm and normal heart sounds.   Tachycardia   Respiratory: Effort normal and breath sounds normal.  GI: Soft. Bowel sounds are normal. She exhibits no distension. There is no tenderness.  Genitourinary: Vagina normal.  Vag exam, 1.5/70/-1, very posterior, vtx   Musculoskeletal: Normal range of motion.  Neurological: She is alert and oriented to person, place, and time. She has normal reflexes.  Skin: Skin is warm and dry.  Psychiatric: She has a normal mood and affect. Her behavior is normal.    MAU Course  Procedures    Assessment and Plan  IUP at [redacted]w[redacted]d Preterm ctx SLE Infant w heart block - has f/u appt w MFM and ped cardiology tmrw  FHR cat 1 toco initially w ctx 2-3 min, then tracing irritability  UA - 15ketons, otherwise nl  Will start IVF's w LR bolus Stadol 2mg  IVP  Dr Estanislado Pandy to assume care   LILLARD,SHELLEY M 09/24/2013, 7:20 AM   Stadol resolved pain for 1 hour and recurred. Patient was given Procardia 10 mg and now feels much better D/C home with Ambien Follow-up as scheduled

## 2013-09-28 ENCOUNTER — Encounter (HOSPITAL_COMMUNITY): Payer: Self-pay

## 2013-09-28 ENCOUNTER — Inpatient Hospital Stay (HOSPITAL_COMMUNITY)
Admission: AD | Admit: 2013-09-28 | Discharge: 2013-09-28 | Disposition: A | Discharge status: Home / Self Care | Payer: Medicaid Other | Source: Ambulatory Visit | Attending: Obstetrics and Gynecology | Admitting: Obstetrics and Gynecology

## 2013-09-28 ENCOUNTER — Inpatient Hospital Stay (HOSPITAL_COMMUNITY): Payer: Medicaid Other

## 2013-09-28 DIAGNOSIS — O358XX0 Maternal care for other (suspected) fetal abnormality and damage, not applicable or unspecified: Secondary | ICD-10-CM | POA: Diagnosis present

## 2013-09-28 DIAGNOSIS — M329 Systemic lupus erythematosus, unspecified: Secondary | ICD-10-CM | POA: Insufficient documentation

## 2013-09-28 DIAGNOSIS — O4700 False labor before 37 completed weeks of gestation, unspecified trimester: Secondary | ICD-10-CM | POA: Insufficient documentation

## 2013-09-28 DIAGNOSIS — O99891 Other specified diseases and conditions complicating pregnancy: Secondary | ICD-10-CM | POA: Insufficient documentation

## 2013-09-28 DIAGNOSIS — O9989 Other specified diseases and conditions complicating pregnancy, childbirth and the puerperium: Principal | ICD-10-CM

## 2013-09-28 DIAGNOSIS — O35BXX0 Maternal care for other (suspected) fetal abnormality and damage, fetal cardiac anomalies, not applicable or unspecified: Secondary | ICD-10-CM | POA: Diagnosis present

## 2013-09-28 DIAGNOSIS — O368190 Decreased fetal movements, unspecified trimester, not applicable or unspecified: Secondary | ICD-10-CM | POA: Insufficient documentation

## 2013-09-28 LAB — URINALYSIS, ROUTINE W REFLEX MICROSCOPIC
BILIRUBIN URINE: NEGATIVE
GLUCOSE, UA: NEGATIVE mg/dL
KETONES UR: NEGATIVE mg/dL
Nitrite: NEGATIVE
PH: 6 (ref 5.0–8.0)
Protein, ur: NEGATIVE mg/dL
SPECIFIC GRAVITY, URINE: 1.02 (ref 1.005–1.030)
Urobilinogen, UA: 1 mg/dL (ref 0.0–1.0)

## 2013-09-28 LAB — URINE MICROSCOPIC-ADD ON

## 2013-09-28 LAB — WET PREP, GENITAL
Clue Cells Wet Prep HPF POC: NONE SEEN
Trich, Wet Prep: NONE SEEN
YEAST WET PREP: NONE SEEN

## 2013-09-28 MED ORDER — OXYCODONE-ACETAMINOPHEN 5-325 MG PO TABS
1.0000 | ORAL_TABLET | ORAL | Status: DC | PRN
  Administered 2013-09-28: 1 via ORAL
  Filled 2013-09-28: qty 1

## 2013-09-28 MED ORDER — OXYCODONE-ACETAMINOPHEN 5-325 MG PO TABS: 1.0000 | ORAL_TABLET | ORAL | Status: DC | PRN

## 2013-09-28 NOTE — MAU Note (Signed)
Pt states here for decreased fetal movement and is having a lupus flare up. Denies bleeding, does note vaginal discharge that has been more mucus like.

## 2013-09-28 NOTE — Discharge Instructions (Signed)
Monitor fetal movement every day--call if less than 10 movements during your waking hours.

## 2013-09-28 NOTE — MAU Provider Note (Signed)
History   28 yo G2P0010 at 7 6/7 weeks presented after calling c/o decreased FM today (noted FM x 2 today) and continuing episodes of contractions.  Seen 2/28 and 3/4 in MAU for UCs, with negative FFN, GBS, urine culture, and pelvic cultures on 2/28.  Cervix FT, thick, high on 2/28; cervix 1.5, 70, vtx, -1, very posterior on 09/24/13.  Has been on Procardia, but this made her tongue swell when she took it today.  Has also had Vicodin for use, but reports makes her feel the same way, with similar rxn to Benadryl.    Reports feeling crampy, then with more defined UCs 4-6x/hr.  Has appt with cardiologist tomorrow--anticipates a recommendation for timing of delivery, based on presence of 1st degree fetal heart block.  Per original MFM consult in early pregnancy, currently anticipate induction at 39 weeks, unless circumstances change.  Received betamethasone course 1/2 and 1/3.  Patient Active Problem List   Diagnosis Date Noted  . 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 Preterm labor 08/12/2013  . Obesity-BMI 47 05/09/2013  . Hirsutism 10/02/2012  . STD (female) 01/18/2012  . Lupus   . Asthma   . HSV infection      Chief Complaint  Patient presents with  . Decreased Fetal Movement  . Lupus   HPI:  See above  OB History   Grav Para Term Preterm Abortions TAB SAB Ect Mult Living   2    1  1          Past Medical History  Diagnosis Date  . 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 HSV infection   . Asthma   . Lupus     dx age 89  . Headache(784.0)   . Hypertension     hx of elevations, never been on meds  . Infection     UTI  . Anxiety     doing good now    Past Surgical History  Procedure Laterality Date  . Tonsillectomy and adenoidectomy  1998  . Mouth surgery      2 teeth removed- 1 wisdom and 1 in front of it  . Wisdom tooth extraction      Family History  Problem Relation  Age of Onset  . Diabetes Maternal Aunt   . Cancer Maternal Grandmother 72    COLON CA  . Diabetes Maternal Grandmother   . Hypertension Maternal Grandfather   . Cancer Maternal Grandfather     prostate  . Asthma Mother   . Asthma Brother   . Anesthesia problems Neg Hx   . Hypotension Neg Hx   . Malignant hyperthermia Neg Hx   . Pseudochol deficiency Neg Hx   . Cancer Paternal Grandmother     breast    History  Substance Use Topics  . Smoking status: Never Smoker   . Smokeless tobacco: Never Used  . Alcohol Use: No     Comment: not with preg    Allergies:  Allergies  Allergen Reactions  . Bactrim Anaphylaxis and Hives  . Prednisone Anaphylaxis and Hives  . Other Itching    Shrimp:throat itching "can eat other shellfish"  . Pineapple Itching  . Procardia [Nifedipine] Swelling    Swelling of tongue  . Vicodin [Hydrocodone-Acetaminophen] Hives and Swelling    Prescriptions prior to admission  Medication Sig Dispense Refill  . calcium carbonate (TUMS - DOSED IN MG ELEMENTAL CALCIUM) 500 MG  chewable tablet Chew 2 tablets by mouth 3 (three) times daily as needed for indigestion or heartburn.      . diphenhydrAMINE (BENADRYL) 25 MG tablet Take 25 mg by mouth every 6 (six) hours as needed for allergies.      Marland Kitchen HYDROcodone-acetaminophen (NORCO/VICODIN) 5-325 MG per tablet Take 1 tablet by mouth every 6 (six) hours as needed for moderate pain.      Marland Kitchen Hydroxychloroquine Sulfate (PLAQUENIL PO) Take 100 mg by mouth daily.      Marland Kitchen NIFEdipine (PROCARDIA) 10 MG capsule Take 2 capsules (20 mg total) by mouth every 6 (six) hours.  30 capsule  0  . Prenatal Vit-Fe Fumarate-FA (PRENATAL MULTIVITAMIN) TABS Take 1 tablet by mouth daily.      Marland Kitchen albuterol (PROVENTIL HFA;VENTOLIN HFA) 108 (90 BASE) MCG/ACT inhaler Inhale 1-2 puffs into the lungs every 6 (six) hours as needed for wheezing or shortness of breath.      . zolpidem (AMBIEN) 5 MG tablet Take 1 tablet (5 mg total) by mouth at bedtime  as needed for sleep.  12 tablet  0    ROS:  Decreased FM, contractions Physical Exam   Blood pressure 126/85, pulse 110, temperature 97.7 F (36.5 C), temperature source Oral, resp. rate 18, height 5\' 8"  (1.727 m), last menstrual period 01/20/2013.  Physical Exam Chest clear Heart RRR without murmur Abd gravid, NT Pelvic--mucusy d/c in vault, and thin white d/c, cervix 1.5 cm, 70%, vtx, -1 (no change from exam on 09/24/13). Ext WNL  FHR Category 2 at present--baseline 140, minimal variability, no decels, 2 accels in 30 min. UCs:  None on monitor  ED Course  IUP at 34 6/7 weeks Decreased FM PT UCs Lupus 1st degree heart block of fetus  Plan: NST/BPP Wet prep Monitor for contractions   Analee Montee [name], [place] 09/28/2013 3:57 PM  Addendum: Returned from US--Vtx, AFI WNL, BPP 8/8  FHR now reactive, with patient feeling much FM. No UCs.  Results for orders placed during the hospital encounter of 09/28/13 (from the past 24 hour(s))  URINALYSIS, ROUTINE W REFLEX MICROSCOPIC     Status: Abnormal   Collection Time    09/28/13  2:53 PM      Result Value Ref Range   Color, Urine YELLOW  YELLOW   APPearance CLEAR  CLEAR   Specific Gravity, Urine 1.020  1.005 - 1.030   pH 6.0  5.0 - 8.0   Glucose, UA NEGATIVE  NEGATIVE mg/dL   Hgb urine dipstick TRACE (*) NEGATIVE   Bilirubin Urine NEGATIVE  NEGATIVE   Ketones, ur NEGATIVE  NEGATIVE mg/dL   Protein, ur NEGATIVE  NEGATIVE mg/dL   Urobilinogen, UA 1.0  0.0 - 1.0 mg/dL   Nitrite NEGATIVE  NEGATIVE   Leukocytes, UA SMALL (*) NEGATIVE  URINE MICROSCOPIC-ADD ON     Status: Abnormal   Collection Time    09/28/13  2:53 PM      Result Value Ref Range   Squamous Epithelial / LPF MANY (*) RARE   WBC, UA 7-10  <3 WBC/hpf   RBC / HPF 0-2  <3 RBC/hpf   Bacteria, UA MANY (*) RARE   Urine-Other MUCOUS PRESENT    WET PREP, GENITAL     Status: Abnormal   Collection Time    09/28/13  3:30 PM      Result Value Ref Range   Yeast Wet  Prep HPF POC NONE SEEN  NONE SEEN   Trich, Wet Prep NONE SEEN  NONE SEEN   Clue Cells Wet Prep HPF POC NONE SEEN  NONE SEEN   WBC, Wet Prep HPF POC FEW (*) NONE SEEN    Consulted with Dr. Su Hiltoberts Will d/c home with [facility] instructions and s/s labor reviewed. Encouraged hydration and rest. Reviewed original recommendations from MFM--patient will keep scheduled [facility] cardiology on Wednesday [facility]with [facility] scheduled). Changed Vicodin Rx to Percocet (patient has tolerated before without difficulty). Rx Percocet 5/325 1 po q 4 hours prn pain, #30, no refills.  Nigel BridgemanVicki Hameed Kolar, [name] 09/28/13 6p

## 2013-09-29 NOTE — Progress Notes (Signed)
MFM ultrasound  Indication: 28 yr old G2P0010 at 532w6d with lupus, +ssA and ssB antibodies, and fetal 1st degree heart block now with decreased fetal movement and contractions for BPP. Remote read.  Findings: 1. Single intrauterine pregnancy. 2. Posterior placenta without evidence of previa. 3. Normal amniotic fluid index. 4. Normal biophysical profile of 8/8.  Recommendations: 1. Lupus: - previously counseled - recommend fetal growth every 4 weeks - recommend antenatal testing - recommend close surveillance for the development of signs/symptoms of preeclampsia 2. Fetal 1st degree heart block: - followed by Pediatric Cardiology: 3. Normal BPP  Eulis FosterKristen Kerin Kren, MD

## 2013-10-01 ENCOUNTER — Ambulatory Visit (HOSPITAL_COMMUNITY)
Admission: RE | Admit: 2013-10-01 | Discharge: 2013-10-01 | Disposition: A | Discharge status: Home / Self Care | Payer: Medicaid Other | Source: Ambulatory Visit | Attending: Obstetrics and Gynecology | Admitting: Obstetrics and Gynecology

## 2013-10-01 DIAGNOSIS — O358XX0 Maternal care for other (suspected) fetal abnormality and damage, not applicable or unspecified: Secondary | ICD-10-CM

## 2013-10-01 DIAGNOSIS — IMO0002 Reserved for concepts with insufficient information to code with codable children: Secondary | ICD-10-CM

## 2013-10-01 DIAGNOSIS — O99891 Other specified diseases and conditions complicating pregnancy: Secondary | ICD-10-CM | POA: Insufficient documentation

## 2013-10-01 DIAGNOSIS — E669 Obesity, unspecified: Secondary | ICD-10-CM | POA: Insufficient documentation

## 2013-10-01 DIAGNOSIS — M329 Systemic lupus erythematosus, unspecified: Secondary | ICD-10-CM

## 2013-10-01 DIAGNOSIS — O35BXX0 Maternal care for other (suspected) fetal abnormality and damage, fetal cardiac anomalies, not applicable or unspecified: Secondary | ICD-10-CM

## 2013-10-01 DIAGNOSIS — O10019 Pre-existing essential hypertension complicating pregnancy, unspecified trimester: Secondary | ICD-10-CM | POA: Insufficient documentation

## 2013-10-01 DIAGNOSIS — O368390 Maternal care for abnormalities of the fetal heart rate or rhythm, unspecified trimester, not applicable or unspecified: Secondary | ICD-10-CM | POA: Insufficient documentation

## 2013-10-01 DIAGNOSIS — O9989 Other specified diseases and conditions complicating pregnancy, childbirth and the puerperium: Secondary | ICD-10-CM

## 2013-10-01 DIAGNOSIS — O99210 Obesity complicating pregnancy, unspecified trimester: Secondary | ICD-10-CM

## 2013-10-01 DIAGNOSIS — J45909 Unspecified asthma, uncomplicated: Secondary | ICD-10-CM | POA: Insufficient documentation

## 2013-10-01 DIAGNOSIS — A6000 Herpesviral infection of urogenital system, unspecified: Secondary | ICD-10-CM | POA: Insufficient documentation

## 2013-10-09 ENCOUNTER — Encounter (HOSPITAL_COMMUNITY): Payer: Self-pay | Admitting: *Deleted

## 2013-10-09 ENCOUNTER — Inpatient Hospital Stay (HOSPITAL_COMMUNITY)
Admission: AD | Admit: 2013-10-09 | Discharge: 2013-10-09 | Disposition: A | Discharge status: Home / Self Care | Payer: Medicaid Other | Source: Ambulatory Visit | Attending: Obstetrics and Gynecology | Admitting: Obstetrics and Gynecology

## 2013-10-09 ENCOUNTER — Inpatient Hospital Stay (HOSPITAL_COMMUNITY)
Admission: AD | Admit: 2013-10-09 | Discharge: 2013-10-10 | Disposition: A | Discharge status: Home / Self Care | Payer: Medicaid Other | Source: Ambulatory Visit | Attending: Obstetrics and Gynecology | Admitting: Obstetrics and Gynecology

## 2013-10-09 DIAGNOSIS — O4700 False labor before 37 completed weeks of gestation, unspecified trimester: Secondary | ICD-10-CM | POA: Insufficient documentation

## 2013-10-09 DIAGNOSIS — M329 Systemic lupus erythematosus, unspecified: Secondary | ICD-10-CM | POA: Insufficient documentation

## 2013-10-09 DIAGNOSIS — J45909 Unspecified asthma, uncomplicated: Secondary | ICD-10-CM | POA: Insufficient documentation

## 2013-10-09 DIAGNOSIS — O98519 Other viral diseases complicating pregnancy, unspecified trimester: Secondary | ICD-10-CM | POA: Insufficient documentation

## 2013-10-09 DIAGNOSIS — O99891 Other specified diseases and conditions complicating pregnancy: Secondary | ICD-10-CM | POA: Insufficient documentation

## 2013-10-09 DIAGNOSIS — A6000 Herpesviral infection of urogenital system, unspecified: Secondary | ICD-10-CM | POA: Insufficient documentation

## 2013-10-09 DIAGNOSIS — O9989 Other specified diseases and conditions complicating pregnancy, childbirth and the puerperium: Secondary | ICD-10-CM

## 2013-10-09 LAB — URINALYSIS, ROUTINE W REFLEX MICROSCOPIC
Bilirubin Urine: NEGATIVE
Glucose, UA: NEGATIVE mg/dL
Hgb urine dipstick: NEGATIVE
KETONES UR: 15 mg/dL — AB
Leukocytes, UA: NEGATIVE
Nitrite: NEGATIVE
Protein, ur: NEGATIVE mg/dL
Specific Gravity, Urine: 1.015 (ref 1.005–1.030)
UROBILINOGEN UA: 0.2 mg/dL (ref 0.0–1.0)
pH: 6 (ref 5.0–8.0)

## 2013-10-09 LAB — WET PREP, GENITAL
Clue Cells Wet Prep HPF POC: NONE SEEN
Trich, Wet Prep: NONE SEEN
Yeast Wet Prep HPF POC: NONE SEEN

## 2013-10-09 NOTE — MAU Note (Signed)
Pt states she" feels water dripping after I go to the bathroom and it doesn't look like urine and it was dripping when I was in bed"

## 2013-10-09 NOTE — MAU Provider Note (Signed)
History   27yo, G2P0010 at [redacted]w[redacted]d presents with c/o LOF.  Denies VB, UCs, recent fever, resp or GI c/o's, UTI or PIH s/s. GFM.  Chief Complaint  Patient presents with  . Labor Eval  . Rupture of Membranes   HPI  OB History   Grav Para Term Preterm Abortions TAB SAB Ect Mult Living   2    1  1          Past Medical History  Diagnosis Date  . 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 HSV infection   . Asthma   . Lupus     dx age 82  . Headache(784.0)   . Hypertension     hx of elevations, never been on meds  . Infection     UTI  . Anxiety     doing good now    Past Surgical History  Procedure Laterality Date  . Tonsillectomy and adenoidectomy  1998  . Mouth surgery      2 teeth removed- 1 wisdom and 1 in front of it  . Wisdom tooth extraction      Family History  Problem Relation Age of Onset  . Diabetes Maternal Aunt   . Cancer Maternal Grandmother 72    COLON CA  . Diabetes Maternal Grandmother   . Hypertension Maternal Grandfather   . Cancer Maternal Grandfather     prostate  . Asthma Mother   . Asthma Brother   . Anesthesia problems Neg Hx   . Hypotension Neg Hx   . Malignant hyperthermia Neg Hx   . Pseudochol deficiency Neg Hx   . Cancer Paternal Grandmother     breast    History  Substance Use Topics  . Smoking status: Never Smoker   . Smokeless tobacco: Never Used  . Alcohol Use: No     Comment: not with preg    Allergies:  Allergies  Allergen Reactions  . Bactrim Anaphylaxis and Hives  . Prednisone Anaphylaxis and Hives  . Other Itching    Shrimp:throat itching "can eat other shellfish"  . Pineapple Itching  . Procardia [Nifedipine] Swelling    Swelling of tongue  . Vicodin [Hydrocodone-Acetaminophen] Hives and Swelling    Can take Percocet without difficulty    Prescriptions prior to admission  Medication Sig Dispense Refill  . albuterol (PROVENTIL HFA;VENTOLIN HFA) 108 (90 BASE) MCG/ACT inhaler Inhale 1-2  puffs into the lungs every 6 (six) hours as needed for wheezing or shortness of breath.      . calcium carbonate (TUMS - DOSED IN MG ELEMENTAL CALCIUM) 500 MG chewable tablet Chew 2 tablets by mouth 3 (three) times daily as needed for indigestion or heartburn.      . diphenhydrAMINE (BENADRYL) 25 MG tablet Take 25 mg by mouth every 6 (six) hours as needed for allergies.      . Hydroxychloroquine Sulfate (PLAQUENIL PO) Take 100 mg by mouth daily.      Marland Kitchen oxyCODONE-acetaminophen (PERCOCET/ROXICET) 5-325 MG per tablet Take 1 tablet by mouth every 4 (four) hours as needed for severe pain.  30 tablet  0  . Prenatal Vit-Fe Fumarate-FA (PRENATAL MULTIVITAMIN) TABS Take 1 tablet by mouth daily.      Marland Kitchen zolpidem (AMBIEN) 5 MG tablet Take 1 tablet (5 mg total) by mouth at bedtime as needed for sleep.  12 tablet  0    ROS ROS: see HPI above, all other systems are negative  Physical Exam   Blood pressure  133/88, pulse 97, temperature 97.5 F (36.4 C), temperature source Oral, resp. rate 20, height 5\' 8"  (1.727 m), weight 142.429 kg (314 lb), last menstrual period 01/20/2013, SpO2 100.00%.  Physical Exam Chest: Clear Heart: RRR Abdomen: gravid, NT Extremities: WNL  Pelvic: 1.5 / 70 / -1 Posterior  FHT: Reactive NST UCs: Occational   ED Course  IUP at 5885w3d Labor check  No fluid available for fern slide Wet prep neg  No cervical change  D/c home with precautions F/u - next ROB and NST scheduled 3/20    Haroldine LawsOXLEY, Dannell Gortney [name], MSN 10/09/2013 3:20 AM

## 2013-10-09 NOTE — MAU Note (Addendum)
PT  SAYS SHE WAS HERE LAST NIGHT   .  VE 1 CM.      HAS HX  HSV-   BLOOD TEST-  IN 2011.    IS TAKING VALTREX.    SAYS UC HURT BAD   AT 9PM.

## 2013-10-09 NOTE — MAU Note (Signed)
Pt presents with complaint of pelvic pressure and contractions.

## 2013-10-09 NOTE — Discharge Instructions (Signed)

## 2013-10-10 NOTE — MAU Provider Note (Signed)
History   27yo, G2P0010 at [redacted]w[redacted]d presents with c/o frequent and intense UCs since 2100.  Denies VB, LOF, recent fever, resp or GI c/o's, UTI or PIH s/s. GFM.   No chief complaint on file.  HPI  OB History   Grav Para Term Preterm Abortions TAB SAB Ect Mult Living   2    1  1          Past Medical History  Diagnosis Date  . 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 HSV infection   . Asthma   . Lupus     dx age 88  . Headache(784.0)   . Hypertension     hx of elevations, never been on meds  . Infection     UTI  . Anxiety     doing good now    Past Surgical History  Procedure Laterality Date  . Tonsillectomy and adenoidectomy  1998  . Mouth surgery      2 teeth removed- 1 wisdom and 1 in front of it  . Wisdom tooth extraction      Family History  Problem Relation Age of Onset  . Diabetes Maternal Aunt   . Cancer Maternal Grandmother 72    COLON CA  . Diabetes Maternal Grandmother   . Hypertension Maternal Grandfather   . Cancer Maternal Grandfather     prostate  . Asthma Mother   . Asthma Brother   . Anesthesia problems Neg Hx   . Hypotension Neg Hx   . Malignant hyperthermia Neg Hx   . Pseudochol deficiency Neg Hx   . Cancer Paternal Grandmother     breast    History  Substance Use Topics  . Smoking status: Never Smoker   . Smokeless tobacco: Never Used  . Alcohol Use: No     Comment: not with preg    Allergies:  Allergies  Allergen Reactions  . Bactrim Anaphylaxis and Hives  . Prednisone Anaphylaxis and Hives  . Other Itching    Shrimp:throat itching "can eat other shellfish"  . Pineapple Itching  . Procardia [Nifedipine] Swelling    Swelling of tongue  . Vicodin [Hydrocodone-Acetaminophen] Hives and Swelling    Can take Percocet without difficulty    Prescriptions prior to admission  Medication Sig Dispense Refill  . albuterol (PROVENTIL HFA;VENTOLIN HFA) 108 (90 BASE) MCG/ACT inhaler Inhale 1-2 puffs into the lungs  every 6 (six) hours as needed for wheezing or shortness of breath.      . calcium carbonate (TUMS - DOSED IN MG ELEMENTAL CALCIUM) 500 MG chewable tablet Chew 2 tablets by mouth 3 (three) times daily as needed for indigestion or heartburn.      . diphenhydrAMINE (BENADRYL) 25 MG tablet Take 25 mg by mouth every 6 (six) hours as needed for allergies.      . Hydroxychloroquine Sulfate (PLAQUENIL PO) Take 100 mg by mouth daily.      Marland Kitchen oxyCODONE-acetaminophen (PERCOCET/ROXICET) 5-325 MG per tablet Take 1 tablet by mouth every 4 (four) hours as needed for severe pain.  30 tablet  0  . Prenatal Vit-Fe Fumarate-FA (PRENATAL MULTIVITAMIN) TABS Take 1 tablet by mouth daily.      Marland Kitchen zolpidem (AMBIEN) 5 MG tablet Take 1 tablet (5 mg total) by mouth at bedtime as needed for sleep.  12 tablet  0    ROS ROS: see HPI above, all other systems are negative  Physical Exam   Blood pressure 120/85, pulse 98, temperature 98  F (36.7 C), temperature source Oral, resp. rate 18, height 5\' 8"  (1.727 m), weight 142.94 kg (315 lb 2 oz), last menstrual period 01/20/2013.  Physical Exam Chest: Clear Heart: RRR Abdomen: gravid, NT Extremities: WNL  Dilation: 1 Effacement (%): 80 Cervical Position: Posterior Station: -1 Presentation: Vertex Exam by:: Annamary RummageJ. Staphanie Harbison, [name]  FHT: Reactive NST UCs: None traced on toco   ED Course  IUP at 5744w4d Labor check  No cervical change  D/c home with precaution    Haroldine LawsOXLEY, Jesi Jurgens [name], MSN 10/10/2013 12:49 AM

## 2013-10-10 NOTE — Discharge Instructions (Signed)

## 2013-10-12 ENCOUNTER — Encounter (HOSPITAL_COMMUNITY): Payer: Self-pay | Admitting: *Deleted

## 2013-10-12 ENCOUNTER — Inpatient Hospital Stay (HOSPITAL_COMMUNITY)
Admission: AD | Admit: 2013-10-12 | Discharge: 2013-10-12 | Disposition: A | Discharge status: Home / Self Care | Payer: Medicaid Other | Source: Ambulatory Visit | Attending: Obstetrics and Gynecology | Admitting: Obstetrics and Gynecology

## 2013-10-12 DIAGNOSIS — O4700 False labor before 37 completed weeks of gestation, unspecified trimester: Secondary | ICD-10-CM | POA: Insufficient documentation

## 2013-10-12 NOTE — MAU Note (Signed)
Pt G2 P0 at 36.6wk with contractions every 1-892min since 1830.  Denies leaking or bleeding.

## 2013-10-12 NOTE — Discharge Instructions (Signed)

## 2013-10-15 ENCOUNTER — Observation Stay (HOSPITAL_COMMUNITY): Payer: Medicaid Other

## 2013-10-15 ENCOUNTER — Encounter (HOSPITAL_COMMUNITY): Payer: Self-pay | Admitting: *Deleted

## 2013-10-15 ENCOUNTER — Observation Stay (HOSPITAL_COMMUNITY)
Admission: AD | Admit: 2013-10-15 | Discharge: 2013-10-16 | Disposition: A | Discharge status: Home / Self Care | Payer: Medicaid Other | Source: Ambulatory Visit | Attending: Obstetrics and Gynecology | Admitting: Obstetrics and Gynecology

## 2013-10-15 DIAGNOSIS — O368390 Maternal care for abnormalities of the fetal heart rate or rhythm, unspecified trimester, not applicable or unspecified: Principal | ICD-10-CM | POA: Insufficient documentation

## 2013-10-15 MED ORDER — CALCIUM CARBONATE ANTACID 500 MG PO CHEW
2.0000 | CHEWABLE_TABLET | ORAL | Status: DC | PRN
  Filled 2013-10-15: qty 2

## 2013-10-15 MED ORDER — ALBUTEROL SULFATE (2.5 MG/3ML) 0.083% IN NEBU: 3.0000 mL | INHALATION_SOLUTION | Freq: Four times a day (QID) | RESPIRATORY_TRACT | Status: DC | PRN

## 2013-10-15 MED ORDER — OXYCODONE-ACETAMINOPHEN 5-325 MG PO TABS: 1.0000 | ORAL_TABLET | ORAL | Status: DC | PRN

## 2013-10-15 MED ORDER — HYDROXYCHLOROQUINE SULFATE 200 MG PO TABS
100.0000 mg | ORAL_TABLET | Freq: Every day | ORAL | Status: DC
  Administered 2013-10-15 – 2013-10-16 (×2): 100 mg via ORAL
  Filled 2013-10-15 (×2): qty 0.5

## 2013-10-15 MED ORDER — PRENATAL MULTIVITAMIN CH
1.0000 | ORAL_TABLET | Freq: Every day | ORAL | Status: DC
  Administered 2013-10-16: 1 via ORAL
  Filled 2013-10-15 (×2): qty 1

## 2013-10-15 MED ORDER — DIPHENHYDRAMINE HCL 25 MG PO TABS
25.0000 mg | ORAL_TABLET | Freq: Four times a day (QID) | ORAL | Status: DC | PRN
  Filled 2013-10-15: qty 1

## 2013-10-15 MED ORDER — DOCUSATE SODIUM 100 MG PO CAPS
100.0000 mg | ORAL_CAPSULE | Freq: Every day | ORAL | Status: DC
  Administered 2013-10-15 – 2013-10-16 (×2): 100 mg via ORAL
  Filled 2013-10-15 (×4): qty 1

## 2013-10-15 MED ORDER — ZOLPIDEM TARTRATE 5 MG PO TABS
5.0000 mg | ORAL_TABLET | Freq: Every evening | ORAL | Status: DC | PRN
  Administered 2013-10-16: 5 mg via ORAL
  Filled 2013-10-15: qty 1

## 2013-10-15 NOTE — MAU Note (Signed)
Pt sent from MD office for monitoring, BPP was 6/8, also non-reactive NST.  Pt denies bleeding or LOF.

## 2013-10-15 NOTE — Progress Notes (Signed)
Antenatal Unit called to give report on pt for admission. Unable to give report, Nurse  Unable to take report.

## 2013-10-15 NOTE — MAU Note (Signed)
Pt sent over from office for monitoring

## 2013-10-15 NOTE — Progress Notes (Signed)
Pt states she was having spotting 2 days ago

## 2013-10-16 NOTE — Discharge Summary (Signed)
Obstetric Discharge Summary Reason for Admission: NRF Testing-Failed BPP Prenatal Procedures: none Intrapartum Procedures: Not Delivered-Extended Monitoring Postpartum Procedures: N/A Complications-Operative and Postpartum: N/A Hemoglobin  Date Value Ref Range Status  08/12/2013 11.2* 12.0 - 15.0 g/dL Final     HCT  Date Value Ref Range Status  08/12/2013 33.5* 36.0 - 46.0 % Final    Physical Exam:  General: alert and no distress  Discharge Diagnoses: Extended Fetal Monitoring-Not Delivered  Discharge Information: Date: 10/16/2013 Activity: unrestricted Diet: routine Medications: Vicodin and Continue home meds as prescribed Condition: stable Instructions: refer to practice specific booklet and Precautions Discharge to: home Follow-up Information   [facility]. (Our office will contact you to schedule F/U appts.)    Specialty:  Obstetrics and Gynecology   Contact information:   [address] [place] [id number] [phone]      Newborn Data: <<This patient has not yet delivered during this pregnancy.>>  Hayley Webb LYNN 10/16/2013, 8:17 AM

## 2013-10-16 NOTE — Discharge Instructions (Signed)
Fetal Movement Counts Patient Name: __________________________________________________ Patient Due Date: ____________________ Performing a fetal movement count is highly recommended in high-risk pregnancies, but it is good for every pregnant woman to do. Your caregiver may ask you to start counting fetal movements at 28 weeks of the pregnancy. Fetal movements often increase:  After eating a full meal.  After physical activity.  After eating or drinking something sweet or cold.  At rest. Pay attention to when you feel the baby is most active. This will help you notice a pattern of your baby's sleep and wake cycles and what factors contribute to an increase in fetal movement. It is important to perform a fetal movement count at the same time each day when your baby is normally most active.  HOW TO COUNT FETAL MOVEMENTS 1. Find a quiet and comfortable area to sit or lie down on your left side. Lying on your left side provides the best blood and oxygen circulation to your baby. 2. Write down the day and time on a sheet of paper or in a journal. 3. Start counting kicks, flutters, swishes, rolls, or jabs in a 2 hour period. You should feel at least 10 movements within 2 hours. 4. If you do not feel 10 movements in 2 hours, wait 2 3 hours and count again. Look for a change in the pattern or not enough counts in 2 hours. SEEK MEDICAL CARE IF:  You feel less than 10 counts in 2 hours, tried twice.  There is no movement in over an hour.  The pattern is changing or taking longer each day to reach 10 counts in 2 hours.  You feel the baby is not moving as he or she usually does. Date: ____________ Movements: ____________ Start time: ____________ Finish time: ____________  Date: ____________ Movements: ____________ Start time: ____________ Finish time: ____________ Date: ____________ Movements: ____________ Start time: ____________ Finish time: ____________ Date: ____________ Movements: ____________  Start time: ____________ Finish time: ____________ Date: ____________ Movements: ____________ Start time: ____________ Finish time: ____________ Date: ____________ Movements: ____________ Start time: ____________ Finish time: ____________ Date: ____________ Movements: ____________ Start time: ____________ Finish time: ____________ Date: ____________ Movements: ____________ Start time: ____________ Finish time: ____________  Date: ____________ Movements: ____________ Start time: ____________ Finish time: ____________ Date: ____________ Movements: ____________ Start time: ____________ Finish time: ____________ Date: ____________ Movements: ____________ Start time: ____________ Finish time: ____________ Date: ____________ Movements: ____________ Start time: ____________ Finish time: ____________ Date: ____________ Movements: ____________ Start time: ____________ Finish time: ____________ Date: ____________ Movements: ____________ Start time: ____________ Finish time: ____________ Date: ____________ Movements: ____________ Start time: ____________ Finish time: ____________  Date: ____________ Movements: ____________ Start time: ____________ Finish time: ____________ Date: ____________ Movements: ____________ Start time: ____________ Finish time: ____________ Date: ____________ Movements: ____________ Start time: ____________ Finish time: ____________ Date: ____________ Movements: ____________ Start time: ____________ Finish time: ____________ Date: ____________ Movements: ____________ Start time: ____________ Finish time: ____________ Date: ____________ Movements: ____________ Start time: ____________ Finish time: ____________ Date: ____________ Movements: ____________ Start time: ____________ Finish time: ____________  Date: ____________ Movements: ____________ Start time: ____________ Finish time: ____________ Date: ____________ Movements: ____________ Start time: ____________ Finish time:  ____________ Date: ____________ Movements: ____________ Start time: ____________ Finish time: ____________ Date: ____________ Movements: ____________ Start time: ____________ Finish time: ____________ Date: ____________ Movements: ____________ Start time: ____________ Finish time: ____________ Date: ____________ Movements: ____________ Start time: ____________ Finish time: ____________ Date: ____________ Movements: ____________ Start time: ____________ Finish time: ____________  Date: ____________ Movements: ____________ Start time: ____________ Finish   time: ____________ Date: ____________ Movements: ____________ Start time: ____________ Finish time: ____________ Date: ____________ Movements: ____________ Start time: ____________ Finish time: ____________ Date: ____________ Movements: ____________ Start time: ____________ Finish time: ____________ Date: ____________ Movements: ____________ Start time: ____________ Finish time: ____________ Date: ____________ Movements: ____________ Start time: ____________ Finish time: ____________ Date: ____________ Movements: ____________ Start time: ____________ Finish time: ____________  Date: ____________ Movements: ____________ Start time: ____________ Finish time: ____________ Date: ____________ Movements: ____________ Start time: ____________ Finish time: ____________ Date: ____________ Movements: ____________ Start time: ____________ Finish time: ____________ Date: ____________ Movements: ____________ Start time: ____________ Finish time: ____________ Date: ____________ Movements: ____________ Start time: ____________ Finish time: ____________ Date: ____________ Movements: ____________ Start time: ____________ Finish time: ____________ Date: ____________ Movements: ____________ Start time: ____________ Finish time: ____________  Date: ____________ Movements: ____________ Start time: ____________ Finish time: ____________ Date: ____________ Movements:  ____________ Start time: ____________ Finish time: ____________ Date: ____________ Movements: ____________ Start time: ____________ Finish time: ____________ Date: ____________ Movements: ____________ Start time: ____________ Finish time: ____________ Date: ____________ Movements: ____________ Start time: ____________ Finish time: ____________ Date: ____________ Movements: ____________ Start time: ____________ Finish time: ____________ Date: ____________ Movements: ____________ Start time: ____________ Finish time: ____________  Date: ____________ Movements: ____________ Start time: ____________ Finish time: ____________ Date: ____________ Movements: ____________ Start time: ____________ Finish time: ____________ Date: ____________ Movements: ____________ Start time: ____________ Finish time: ____________ Date: ____________ Movements: ____________ Start time: ____________ Finish time: ____________ Date: ____________ Movements: ____________ Start time: ____________ Finish time: ____________ Date: ____________ Movements: ____________ Start time: ____________ Finish time: ____________ Document Released: 08/09/2006 Document Revised: 06/26/2012 Document Reviewed: 05/06/2012 [employer] Patient Information 2014 [employer].  

## 2013-10-16 NOTE — Progress Notes (Signed)
Pt given discharge instructions and verlbalizes understanding 

## 2013-10-23 ENCOUNTER — Inpatient Hospital Stay (HOSPITAL_COMMUNITY): Payer: Medicaid Other

## 2013-10-23 ENCOUNTER — Inpatient Hospital Stay (HOSPITAL_COMMUNITY)
Admission: AD | Admit: 2013-10-23 | Discharge: 2013-10-23 | Disposition: A | Discharge status: Home / Self Care | Payer: Medicaid Other | Source: Ambulatory Visit | Attending: Obstetrics and Gynecology | Admitting: Obstetrics and Gynecology

## 2013-10-23 ENCOUNTER — Encounter (HOSPITAL_COMMUNITY): Payer: Self-pay | Admitting: *Deleted

## 2013-10-23 DIAGNOSIS — O368390 Maternal care for abnormalities of the fetal heart rate or rhythm, unspecified trimester, not applicable or unspecified: Secondary | ICD-10-CM

## 2013-10-23 DIAGNOSIS — O98519 Other viral diseases complicating pregnancy, unspecified trimester: Secondary | ICD-10-CM | POA: Diagnosis not present

## 2013-10-23 DIAGNOSIS — O358XX0 Maternal care for other (suspected) fetal abnormality and damage, not applicable or unspecified: Secondary | ICD-10-CM | POA: Diagnosis not present

## 2013-10-23 DIAGNOSIS — E669 Obesity, unspecified: Secondary | ICD-10-CM | POA: Diagnosis not present

## 2013-10-23 DIAGNOSIS — O99210 Obesity complicating pregnancy, unspecified trimester: Secondary | ICD-10-CM | POA: Diagnosis not present

## 2013-10-23 DIAGNOSIS — O9989 Other specified diseases and conditions complicating pregnancy, childbirth and the puerperium: Principal | ICD-10-CM

## 2013-10-23 DIAGNOSIS — A6000 Herpesviral infection of urogenital system, unspecified: Secondary | ICD-10-CM | POA: Insufficient documentation

## 2013-10-23 DIAGNOSIS — M329 Systemic lupus erythematosus, unspecified: Secondary | ICD-10-CM | POA: Insufficient documentation

## 2013-10-23 DIAGNOSIS — O99891 Other specified diseases and conditions complicating pregnancy: Secondary | ICD-10-CM | POA: Diagnosis present

## 2013-10-23 NOTE — OB Triage Note (Signed)
Pt sent here from office for NST.

## 2013-10-23 NOTE — Progress Notes (Signed)
S:Patient presents from office for BPP after NR NST.    O: BPP 8/8.  FHR-140 VSS   A: Lupus HSV Infection Morbid Obesity per BMI Fetal heart defect/disease   P: Scheduled for induction on 4/7.   Discussed IOL procedure.  All questions and concerns addressed.   BPP on Monday.   DC to home. Call if you have any questions or concerns prior to your next visit.   Erhard Senske LYNN, [name]

## 2013-10-23 NOTE — Discharge Instructions (Signed)
Braxton Hicks Contractions Pregnancy is commonly associated with contractions of the uterus throughout the pregnancy. Towards the end of pregnancy (32 to 34 weeks), these contractions [facility] Willa Rough) can develop more often and may become more forceful. This is not true labor because these contractions do not result in opening (dilatation) and thinning of the cervix. They are sometimes difficult to tell apart from true labor because these contractions can be forceful and people have different pain tolerances. You should not feel embarrassed if you go to the hospital with false labor. Sometimes, the only way to tell if you are in true labor is for your caregiver to follow the changes in the cervix. How to tell the difference between true and false labor:  False labor.  The contractions of false labor are usually shorter, irregular and not as hard as those of true labor.  They are often felt in the front of the lower abdomen and in the groin.  They may leave with walking around or changing positions while lying down.  They get weaker and are shorter lasting as time goes on.  These contractions are usually irregular.  They do not usually become progressively stronger, regular and closer together as with true labor. True labor.Labor Induction  Labor induction is when steps are taken to cause a pregnant woman to begin the labor process. Most women go into labor on their own between 37 weeks and 42 weeks of the pregnancy. When this does not happen or when there is a medical need, methods may be used to induce labor. Labor induction causes a pregnant woman's uterus to contract. It also causes the cervix to soften (ripen), open (dilate), and thin out (efface). Usually, labor is not induced before 39 weeks of the pregnancy unless there is a problem with the baby or mother.  Before inducing labor, your health care provider will consider a number of factors, including the following: The medical condition of  you and the baby.  How many weeks along you are.  The status of the baby's lung maturity.  The condition of the cervix.  The position of the baby.  WHAT ARE THE REASONS FOR LABOR INDUCTION? Labor may be induced for the following reasons: The health of the baby or mother is at risk.  The pregnancy is overdue by 1 week or more.  The water breaks but labor does not start on its own.  The mother has a health condition or serious illness, such as high blood pressure, infection, placental abruption, or diabetes. The amniotic fluid amounts are low around the baby.  The baby is distressed.  Convenience or wanting the baby to be born on a certain date is not a reason for inducing labor. WHAT METHODS ARE USED FOR LABOR INDUCTION? Several methods of labor induction may be used, such as:  Prostaglandin medicine. This medicine causes the cervix to dilate and ripen. The medicine will also start contractions. It can be taken by mouth or by inserting a suppository into the vagina.  Inserting a thin tube (catheter) with a balloon on the end into the vagina to dilate the cervix. Once inserted, the balloon is expanded with water, which causes the cervix to open.  Stripping the membranes. Your health care provider separates amniotic sac tissue from the cervix, causing the cervix to be stretched and causing the release of a hormone called progesterone. This may cause the uterus to contract. It is often done during an office visit. You will be sent home to wait  for the contractions to begin. You will then come in for an induction.  Breaking the water. Your health care provider makes a hole in the amniotic sac using a small instrument. Once the amniotic sac breaks, contractions should begin. This may still take hours to see an effect.  Medicine to trigger or strengthen contractions. This medicine is given through an IV access tube inserted into a vein in your arm.  All of the methods of induction,  besides stripping the membranes, will be done in the hospital. Induction is done in the hospital so that you and the baby can be carefully monitored.  HOW LONG DOES IT TAKE FOR LABOR TO BE INDUCED? Some inductions can take up to 2 3 days. Depending on the cervix, it usually takes less time. It takes longer when you are induced early in the pregnancy or if this is your first pregnancy. If a mother is still pregnant and the induction has been going on for 2 3 days, either the mother will be sent home or a cesarean delivery will be needed. WHAT ARE THE RISKS ASSOCIATED WITH LABOR INDUCTION? Some of the risks of induction include:  Changes in fetal heart rate, such as too high, too low, or erratic.  Fetal distress.  Chance of infection for the mother and baby.  Increased chance of having a cesarean delivery.  Breaking off (abruption) of the placenta from the uterus (rare).  Uterine rupture (very rare).  When induction is needed for medical reasons, the benefits of induction may outweigh the risks. WHAT ARE SOME REASONS FOR NOT INDUCING LABOR? Labor induction should not be done if:  It is shown that your baby does not tolerate labor.  You have had previous surgeries on your uterus, such as a myomectomy or the removal of fibroids.  Your placenta lies very low in the uterus and blocks the opening of the cervix (placenta previa).  Your baby is not in a head-down position.  The umbilical cord drops down into the birth canal in front of the baby. This could cut off the baby's blood and oxygen supply.  You have had a previous cesarean delivery.  There are unusual circumstances, such as the baby being extremely premature.  Document Released: 11/29/2006 Document Revised: 03/12/2013 Document Reviewed: 02/06/2013 [facility] Patient Information 2014 [place], [place].    Contractions in true labor last 30 to 70 seconds, become very regular, usually become more intense, and increase in  frequency.  They do not go away with walking.  The discomfort is usually felt in the top of the uterus and spreads to the lower abdomen and low back.  True labor can be determined by your caregiver with an exam. This will show that the cervix is dilating and getting thinner. If there are no prenatal problems or other health problems associated with the pregnancy, it is completely safe to be sent home with false labor and await the onset of true labor. HOME CARE INSTRUCTIONS   Keep up with your usual exercises and instructions.  Take medications as directed.  Keep your regular prenatal appointment.  Eat and drink lightly if you think you are going into labor.  If BH contractions are making you uncomfortable:  Change your activity position from lying down or resting to walking/walking to resting.  Sit and rest in a tub of warm water.  Drink 2 to 3 glasses of water. Dehydration may cause B-H contractions.  Do slow and deep breathing several times an hour. SEEK IMMEDIATE MEDICAL  CARE IF:   Your contractions continue to become stronger, more regular, and closer together.  You have a gushing, burst or leaking of fluid from the vagina.  An oral temperature above 102 F (38.9 C) develops.  You have passage of blood-tinged mucus.  You develop vaginal bleeding.  You develop continuous belly (abdominal) pain.  You have low back pain that you never had before.  You feel the baby's head pushing down causing pelvic pressure.  The baby is not moving as much as it used to. Document Released: 07/10/2005 Document Revised: 10/02/2011 Document Reviewed: 04/21/2013 [facility] Patient Information 2014 [place].

## 2013-10-24 ENCOUNTER — Inpatient Hospital Stay (HOSPITAL_COMMUNITY)
Admission: AD | Admit: 2013-10-24 | Discharge: 2013-10-27 | DRG: 774 | Disposition: A | Discharge status: Home / Self Care | Payer: Medicaid Other | Source: Ambulatory Visit | Attending: Obstetrics and Gynecology | Admitting: Obstetrics and Gynecology

## 2013-10-24 ENCOUNTER — Telehealth (HOSPITAL_COMMUNITY): Payer: Self-pay | Admitting: *Deleted

## 2013-10-24 DIAGNOSIS — O358XX0 Maternal care for other (suspected) fetal abnormality and damage, not applicable or unspecified: Secondary | ICD-10-CM | POA: Diagnosis present

## 2013-10-24 DIAGNOSIS — O99892 Other specified diseases and conditions complicating childbirth: Secondary | ICD-10-CM | POA: Diagnosis present

## 2013-10-24 DIAGNOSIS — A6000 Herpesviral infection of urogenital system, unspecified: Secondary | ICD-10-CM | POA: Diagnosis present

## 2013-10-24 DIAGNOSIS — M329 Systemic lupus erythematosus, unspecified: Secondary | ICD-10-CM | POA: Diagnosis present

## 2013-10-24 DIAGNOSIS — L680 Hirsutism: Secondary | ICD-10-CM | POA: Diagnosis present

## 2013-10-24 DIAGNOSIS — O1002 Pre-existing essential hypertension complicating childbirth: Secondary | ICD-10-CM | POA: Diagnosis present

## 2013-10-24 DIAGNOSIS — R000 Tachycardia, unspecified: Secondary | ICD-10-CM | POA: Diagnosis present

## 2013-10-24 DIAGNOSIS — O99214 Obesity complicating childbirth: Secondary | ICD-10-CM

## 2013-10-24 DIAGNOSIS — E669 Obesity, unspecified: Secondary | ICD-10-CM | POA: Diagnosis present

## 2013-10-24 DIAGNOSIS — Z22330 Carrier of Group B streptococcus: Secondary | ICD-10-CM

## 2013-10-24 DIAGNOSIS — O98519 Other viral diseases complicating pregnancy, unspecified trimester: Secondary | ICD-10-CM | POA: Diagnosis present

## 2013-10-24 DIAGNOSIS — O9989 Other specified diseases and conditions complicating pregnancy, childbirth and the puerperium: Secondary | ICD-10-CM

## 2013-10-24 NOTE — MAU Note (Signed)
Contractions all day, worse this evening, every minutes. Bloody discharge. Denies LOF. Positive fetal movement.

## 2013-10-24 NOTE — Telephone Encounter (Signed)
Preadmission screen  

## 2013-10-25 ENCOUNTER — Inpatient Hospital Stay (HOSPITAL_COMMUNITY): Payer: Medicaid Other | Admitting: Anesthesiology

## 2013-10-25 ENCOUNTER — Encounter (HOSPITAL_COMMUNITY): Payer: Self-pay

## 2013-10-25 ENCOUNTER — Encounter (HOSPITAL_COMMUNITY): Payer: Medicaid Other | Admitting: Anesthesiology

## 2013-10-25 LAB — CBC
HCT: 35.3 % — ABNORMAL LOW (ref 36.0–46.0)
HEMOGLOBIN: 12.1 g/dL (ref 12.0–15.0)
MCH: 29.7 pg (ref 26.0–34.0)
MCHC: 34.3 g/dL (ref 30.0–36.0)
MCV: 86.7 fL (ref 78.0–100.0)
Platelets: 284 10*3/uL (ref 150–400)
RBC: 4.07 MIL/uL (ref 3.87–5.11)
RDW: 14.3 % (ref 11.5–15.5)
WBC: 6.5 10*3/uL (ref 4.0–10.5)

## 2013-10-25 LAB — PROTEIN / CREATININE RATIO, URINE
Creatinine, Urine: 297.06 mg/dL
Protein Creatinine Ratio: 0.1 (ref 0.00–0.15)
TOTAL PROTEIN, URINE: 28.4 mg/dL

## 2013-10-25 LAB — COMPREHENSIVE METABOLIC PANEL
ALT: 11 U/L (ref 0–35)
AST: 19 U/L (ref 0–37)
Albumin: 2.9 g/dL — ABNORMAL LOW (ref 3.5–5.2)
Alkaline Phosphatase: 93 U/L (ref 39–117)
BILIRUBIN TOTAL: 0.3 mg/dL (ref 0.3–1.2)
BUN: 9 mg/dL (ref 6–23)
CALCIUM: 9.6 mg/dL (ref 8.4–10.5)
CO2: 19 mEq/L (ref 19–32)
Chloride: 100 mEq/L (ref 96–112)
Creatinine, Ser: 0.56 mg/dL (ref 0.50–1.10)
GFR calc non Af Amer: >90 mL/min (ref >90)
GLUCOSE: 102 mg/dL — AB (ref 70–99)
Potassium: 3.8 mEq/L (ref 3.7–5.3)
Sodium: 136 mEq/L — ABNORMAL LOW (ref 137–147)
Total Protein: 6.7 g/dL (ref 6.0–8.3)

## 2013-10-25 LAB — ABO/RH: ABO/RH(D): O POS

## 2013-10-25 LAB — RPR: RPR Ser Ql: NONREACTIVE

## 2013-10-25 LAB — TYPE AND SCREEN
ABO/RH(D): O POS
ANTIBODY SCREEN: NEGATIVE

## 2013-10-25 LAB — LACTATE DEHYDROGENASE: LDH: 190 U/L (ref 94–250)

## 2013-10-25 LAB — URIC ACID: Uric Acid, Serum: 3.3 mg/dL (ref 2.4–7.0)

## 2013-10-25 LAB — OB RESULTS CONSOLE GBS: GBS: POSITIVE

## 2013-10-25 MED ORDER — LABETALOL HCL 5 MG/ML IV SOLN: 10.0000 mg | INTRAVENOUS | Status: DC | PRN

## 2013-10-25 MED ORDER — DIPHENHYDRAMINE HCL 50 MG/ML IJ SOLN
12.5000 mg | INTRAMUSCULAR | Status: DC | PRN
  Administered 2013-10-25: 12.5 mg via INTRAVENOUS
  Filled 2013-10-25: qty 1

## 2013-10-25 MED ORDER — OXYTOCIN 40 UNITS IN LACTATED RINGERS INFUSION - SIMPLE MED
62.5000 mL/h | INTRAVENOUS | Status: DC
  Administered 2013-10-25: 62.5 mL/h via INTRAVENOUS

## 2013-10-25 MED ORDER — SENNOSIDES-DOCUSATE SODIUM 8.6-50 MG PO TABS
2.0000 | ORAL_TABLET | ORAL | Status: DC
  Administered 2013-10-25 – 2013-10-27 (×2): 2 via ORAL
  Filled 2013-10-25 (×2): qty 2

## 2013-10-25 MED ORDER — PHENYLEPHRINE 40 MCG/ML (10ML) SYRINGE FOR IV PUSH (FOR BLOOD PRESSURE SUPPORT)
80.0000 ug | PREFILLED_SYRINGE | INTRAVENOUS | Status: DC | PRN
  Filled 2013-10-25: qty 10
  Filled 2013-10-25: qty 2

## 2013-10-25 MED ORDER — FENTANYL 2.5 MCG/ML BUPIVACAINE 1/10 % EPIDURAL INFUSION (WH - ANES)
14.0000 mL/h | INTRAMUSCULAR | Status: DC | PRN
  Administered 2013-10-25: 14 mL/h via EPIDURAL
  Filled 2013-10-25 (×2): qty 125

## 2013-10-25 MED ORDER — OXYCODONE-ACETAMINOPHEN 5-325 MG PO TABS: 1.0000 | ORAL_TABLET | ORAL | Status: DC | PRN

## 2013-10-25 MED ORDER — BENZOCAINE-MENTHOL 20-0.5 % EX AERO
1.0000 "application " | INHALATION_SPRAY | CUTANEOUS | Status: DC | PRN
  Filled 2013-10-25: qty 56

## 2013-10-25 MED ORDER — PRENATAL MULTIVITAMIN CH
1.0000 | ORAL_TABLET | Freq: Every day | ORAL | Status: DC
  Administered 2013-10-26: 1 via ORAL
  Filled 2013-10-25: qty 1

## 2013-10-25 MED ORDER — PHENYLEPHRINE 40 MCG/ML (10ML) SYRINGE FOR IV PUSH (FOR BLOOD PRESSURE SUPPORT)
80.0000 ug | PREFILLED_SYRINGE | INTRAVENOUS | Status: DC | PRN
Start: 2013-10-25 — End: 2013-10-25
  Filled 2013-10-25: qty 2

## 2013-10-25 MED ORDER — PENICILLIN G POTASSIUM 5000000 UNITS IJ SOLR
2.5000 10*6.[IU] | INTRAMUSCULAR | Status: DC
  Administered 2013-10-25 (×4): 2.5 10*6.[IU] via INTRAVENOUS
  Filled 2013-10-25 (×7): qty 2.5

## 2013-10-25 MED ORDER — DEXAMETHASONE 4 MG PO TABS
4.0000 mg | ORAL_TABLET | Freq: Once | ORAL | Status: AC
  Administered 2013-10-25: 4 mg via ORAL
  Filled 2013-10-25: qty 1

## 2013-10-25 MED ORDER — TERBUTALINE SULFATE 1 MG/ML IJ SOLN: 0.2500 mg | Freq: Once | INTRAMUSCULAR | Status: DC | PRN

## 2013-10-25 MED ORDER — ONDANSETRON HCL 4 MG PO TABS: 4.0000 mg | ORAL_TABLET | ORAL | Status: DC | PRN

## 2013-10-25 MED ORDER — CITRIC ACID-SODIUM CITRATE 334-500 MG/5ML PO SOLN: 30.0000 mL | ORAL | Status: DC | PRN

## 2013-10-25 MED ORDER — LANOLIN HYDROUS EX OINT: TOPICAL_OINTMENT | CUTANEOUS | Status: DC | PRN

## 2013-10-25 MED ORDER — LACTATED RINGERS IV SOLN
INTRAVENOUS | Status: DC
  Administered 2013-10-25: 15:00:00 via INTRAUTERINE

## 2013-10-25 MED ORDER — WITCH HAZEL-GLYCERIN EX PADS: 1.0000 "application " | MEDICATED_PAD | CUTANEOUS | Status: DC | PRN

## 2013-10-25 MED ORDER — LACTATED RINGERS IV SOLN: 500.0000 mL | Freq: Once | INTRAVENOUS | Status: DC

## 2013-10-25 MED ORDER — ZOLPIDEM TARTRATE 5 MG PO TABS: 5.0000 mg | ORAL_TABLET | Freq: Every evening | ORAL | Status: DC | PRN

## 2013-10-25 MED ORDER — OXYTOCIN BOLUS FROM INFUSION: 500.0000 mL | INTRAVENOUS | Status: DC

## 2013-10-25 MED ORDER — IBUPROFEN 600 MG PO TABS
600.0000 mg | ORAL_TABLET | Freq: Four times a day (QID) | ORAL | Status: DC
  Administered 2013-10-25 – 2013-10-27 (×6): 600 mg via ORAL
  Filled 2013-10-25 (×6): qty 1

## 2013-10-25 MED ORDER — LACTATED RINGERS IV SOLN
500.0000 mL | INTRAVENOUS | Status: DC | PRN
  Administered 2013-10-25: 500 mL via INTRAVENOUS

## 2013-10-25 MED ORDER — ONDANSETRON HCL 4 MG/2ML IJ SOLN: 4.0000 mg | INTRAMUSCULAR | Status: DC | PRN

## 2013-10-25 MED ORDER — ACETAMINOPHEN 325 MG PO TABS
650.0000 mg | ORAL_TABLET | ORAL | Status: DC | PRN
  Filled 2013-10-25: qty 2

## 2013-10-25 MED ORDER — FENTANYL 2.5 MCG/ML BUPIVACAINE 1/10 % EPIDURAL INFUSION (WH - ANES)
INTRAMUSCULAR | Status: DC | PRN
  Administered 2013-10-25: 14 mL/h via EPIDURAL

## 2013-10-25 MED ORDER — SIMETHICONE 80 MG PO CHEW: 80.0000 mg | CHEWABLE_TABLET | ORAL | Status: DC | PRN

## 2013-10-25 MED ORDER — FLEET ENEMA 7-19 GM/118ML RE ENEM: 1.0000 | ENEMA | RECTAL | Status: DC | PRN

## 2013-10-25 MED ORDER — LIDOCAINE HCL (PF) 1 % IJ SOLN
30.0000 mL | INTRAMUSCULAR | Status: DC | PRN
  Filled 2013-10-25: qty 30

## 2013-10-25 MED ORDER — DIPHENHYDRAMINE HCL 25 MG PO CAPS: 25.0000 mg | ORAL_CAPSULE | Freq: Four times a day (QID) | ORAL | Status: DC | PRN

## 2013-10-25 MED ORDER — LACTATED RINGERS IV SOLN
INTRAVENOUS | Status: DC
  Administered 2013-10-25 (×4): via INTRAVENOUS

## 2013-10-25 MED ORDER — EPHEDRINE 5 MG/ML INJ
10.0000 mg | INTRAVENOUS | Status: DC | PRN
  Filled 2013-10-25: qty 2
  Filled 2013-10-25: qty 4

## 2013-10-25 MED ORDER — DIBUCAINE 1 % RE OINT: 1.0000 "application " | TOPICAL_OINTMENT | RECTAL | Status: DC | PRN

## 2013-10-25 MED ORDER — OXYTOCIN 40 UNITS IN LACTATED RINGERS INFUSION - SIMPLE MED
1.0000 m[IU]/min | INTRAVENOUS | Status: DC
  Administered 2013-10-25: 1 m[IU]/min via INTRAVENOUS
  Filled 2013-10-25: qty 1000

## 2013-10-25 MED ORDER — ONDANSETRON HCL 4 MG/2ML IJ SOLN: 4.0000 mg | Freq: Four times a day (QID) | INTRAMUSCULAR | Status: DC | PRN

## 2013-10-25 MED ORDER — LIDOCAINE HCL (PF) 1 % IJ SOLN
INTRAMUSCULAR | Status: DC | PRN
  Administered 2013-10-25: 8 mL
  Administered 2013-10-25: 9 mL

## 2013-10-25 MED ORDER — BUTORPHANOL TARTRATE 1 MG/ML IJ SOLN: 1.0000 mg | INTRAMUSCULAR | Status: DC | PRN

## 2013-10-25 MED ORDER — METHYLPREDNISOLONE SODIUM SUCC 40 MG IJ SOLR
25.0000 mg | Freq: Four times a day (QID) | INTRAMUSCULAR | Status: DC
  Administered 2013-10-25 – 2013-10-26 (×4): 25 mg via INTRAVENOUS
  Filled 2013-10-25 (×5): qty 0.63

## 2013-10-25 MED ORDER — PENICILLIN G POTASSIUM 5000000 UNITS IJ SOLR
5.0000 10*6.[IU] | Freq: Once | INTRAVENOUS | Status: AC
  Administered 2013-10-25: 5 10*6.[IU] via INTRAVENOUS
  Filled 2013-10-25: qty 5

## 2013-10-25 MED ORDER — HYDROXYCHLOROQUINE SULFATE 200 MG PO TABS
100.0000 mg | ORAL_TABLET | Freq: Every day | ORAL | Status: DC
  Administered 2013-10-25 – 2013-10-27 (×3): 100 mg via ORAL
  Filled 2013-10-25 (×3): qty 0.5

## 2013-10-25 MED ORDER — EPHEDRINE 5 MG/ML INJ
10.0000 mg | INTRAVENOUS | Status: DC | PRN
  Filled 2013-10-25: qty 2

## 2013-10-25 MED ORDER — TETANUS-DIPHTH-ACELL PERTUSSIS 5-2.5-18.5 LF-MCG/0.5 IM SUSP
0.5000 mL | Freq: Once | INTRAMUSCULAR | Status: AC
  Administered 2013-10-26: 0.5 mL via INTRAMUSCULAR
  Filled 2013-10-25: qty 0.5

## 2013-10-25 MED ORDER — IBUPROFEN 600 MG PO TABS: 600.0000 mg | ORAL_TABLET | Freq: Four times a day (QID) | ORAL | Status: DC | PRN

## 2013-10-25 NOTE — Progress Notes (Signed)
Called to delivery room due to recurrent variable decels to 60bpm for 1min with contractions.  SVE: C/C/+4 with head visible at the introitus.  Kiwi vacuum applied during contraction with pushing, 2 pulls with delivery of fetal head.  Tight nuchal reduced followed by delivery of viable female infant.  For further information please see Delivery summary.  Myna HidalgoJennifer Kenishia Plack, DO [phone] (pager) [phone] (office)

## 2013-10-25 NOTE — Progress Notes (Addendum)
  Subjective: Pt feeling pressure.  Family at bedside and supportive.  Discussed R&B of IUPC and FSE, pt verbalizes understanding and wishes to proceed.  Objective: BP 141/89  Pulse 109  Temp(Src) 98.5 F (36.9 C) (Oral)  Resp 16  Ht 5\' 8"  (1.727 m)  Wt 314 lb (142.429 kg)  BMI 47.75 kg/m2  SpO2 100%  LMP 01/20/2013      FHT: Category II UC:   regular, every 1-2 minutes  Dilation: 8 cm Effacement (%): 100 Cervical Position: Posterior Station: -1 Presentation: Vertex Exam by:: JOxley,cnm  Reviewed strip with Dr. Charlotta Newtonzan Augmentation of labor, progressing well  Labor: Progressing on Pitocin, Pitocin at 13 miliU  Preeclampsia: no signs or symptoms of toxicity  Fetal Wellbeing: Category II  Pain Control: Epidural  I/D: GBS pos; PCN; SROM at 0539; Afebrile    Anticipated MOD: NSVD    Grason Brailsford 10/25/2013, 7:23 AM

## 2013-10-25 NOTE — Progress Notes (Signed)
  Subjective: Contractions still painful, now slightly more irregular in pattern.  Objective: BP 132/72  Pulse 89  Temp(Src) 97.8 F (36.6 C) (Oral)  Resp 18  Ht 5\' 8"  (1.727 m)  Wt 314 lb (142.429 kg)  BMI 47.75 kg/m2  LMP 01/20/2013      Filed Vitals:   10/25/13 0051 10/25/13 0102 10/25/13 0305 10/25/13 0405  BP: 144/98  133/72 132/72  Pulse: 116  93 89  Temp: 97.7 F (36.5 C)  97.8 F (36.6 C)   TempSrc: Oral  Oral   Resp: 18  16 18   Height:  5\' 8"  (1.727 m)    Weight:  314 lb (142.429 kg)       FHT: Category 1 UC:   irregular, every 2-4 minutes SVE:   Dilation: 2 Effacement (%): 70 Station: -1 Exam by:: Chip BoerVicki [name]  Cervix very posterior  Assessment:  Latent phase Lupus Elevated BP GBS positive  Plan: Start augmentation with low-dose pitocin. Pain medication prn.  Nigel BridgemanLATHAM, Klein Willcox 10/25/2013, 4:48 AM

## 2013-10-25 NOTE — Lactation Note (Signed)
This note was copied from the chart of Hayley Webb. Lactation Consultation Note  Patient Name: Hayley Webb QMVHQ'IToday's Date: 10/25/2013 Reason for consult: Initial assessment Called by RN to see Mom as she was having trouble latching baby, he will not sustain the latch. Mom has large breasts, nipples are semi-flat, short nipple shaft but very compressible. Prior to my arrival RN had Mom pre-pump using hand pump on left breast and Mom received approx 3 ml of EBM. Baby sleepy but would suckle off an on with suck exam with my finger. He is noted to tongue thrust. With full assist and breast compression baby was able to latch and take a few suckles, but if LC did not continue full assist baby would lose latch and fall asleep. Demonstrated to Mom how to give supplement EBM via curved tipped syringe. BF basics reviewed with Mom. Encouraged to BF with feeding ques, but if baby is not latching she can hand express or pump and give baby back EBM as we did today. Advised Mom as baby gets more awake his ability to latch may improve, if not we have other tools we can use to help with latch. Lactation brochure left for review. Advised of OP services and support group. Advised to call for assist as needed.   Maternal Data Formula Feeding for Exclusion: No Infant to breast within first hour of birth: No Breastfeeding delayed due to:: Infant status (Plan by MFM - infant for observation & EKG) Has patient been taught Hand Expression?: No Does the patient have breastfeeding experience prior to this delivery?: No  Feeding Feeding Type: Breast Fed Length of feed: 10 min (off and on)  LATCH Score/Interventions Latch: Repeated attempts needed to sustain latch, nipple held in mouth throughout feeding, stimulation needed to elicit sucking reflex. Intervention(s): Adjust position;Assist with latch;Breast massage;Breast compression  Audible Swallowing: None  Type of Nipple: Flat  Comfort (Breast/Nipple): Soft  / non-tender     Hold (Positioning): Full assist, staff holds infant at breast  LATCH Score: 4  Lactation Tools Discussed/Used Tools: Pump;Other (comment) (curved tipped syringe) Breast pump type: Manual   Consult Status Consult Status: Follow-up Date: 10/26/13 Follow-up type: In-patient    Alfred LevinsGranger, Marlayna Bannister Ann 10/25/2013, 10:06 PM

## 2013-10-25 NOTE — H&P (Signed)
Hayley Webb is a 28 y.o. female, G2P0010 at 7438 5/7 weeks, presenting for contractions and bloody show since early afternoon.  Denies leaking, reports +FM.    Has been followed at MFM and Pediatric Cardiology--on Plaquenil 200 mg daily. [facility] and has been on Dexamethsone daily--to complete course today.  [facility] under "Care Everywhere" tab--can find progress notes under "summary" and "documents" subsection.  Recommendations from Dr. Viviano SimasMaurer (ped cardiology), baby should get an EKG after delivery.  If fetal heart block is present after delivery, baby to be treated with steroids for at least 4-6 weeks.   Patient Active Problem List   Diagnosis Date Noted  . Antepartum non-reassuring fetal heart rate or rhythm affecting care of mother 10/15/2013  . 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 Preterm labor 08/12/2013  . Obesity-BMI 47 05/09/2013  . Hirsutism 10/02/2012  . STD (female) 01/18/2012  . Lupus   . Asthma   . HSV infection   Fetal heart block has resolved per fetal echo of 10/01/13  History of present pregnancy: Patient entered care at 8 1/7 weeks.   EDC of 11/03/13 was established by 7 week US.   Anatomy scan:  19 weeks, with normal findings and an posterior placenta.   Additional US evaluations:   Growth q 4 weeks--WNL 37 2/7 weeks--EFW 2869 gm, 46%ile, AFI 14.5, 55%ile, vtx, AC measuring 1 week behind.  BPP + NST  6/10--sent to MAU for monitoring.   Significant prenatal events:  Last lupus flare 09/28/13, seen in MAU.  Doppler studies at 19 weeks for leg pain--WNL.    Declined flu vaccine.  Evaluated for PTL in January, on Procardia prn.  Received betamethasone 1/2, 1/3.  GBS positive in January.  Started on Valtrex at 34 weeks, with no hx of outbreaks.  Plan made for induction of labor at 39 weeks. Last evaluation:  3/31--had  periodic spotting.  Cervix 2 cm, 70%, vtx, -1, BP 90/68.    Pregnancy Care Plan: MFM consult 05/09/13, due to lupus, +SSA/SSB antibodies, and obesity, with following recommendations: 24 hour urine and PIH labs as baseline--WNL NST twice weekly beginning at 32 weeks, with BPP if non-reactive. US for growth and fluid q 3-4 weeks--normal growth noted during pregnancy. Referred to pediatric cardiology at 16 weeks for risk of fetal heart block and continue weekly with pediatric cardiology until 28 weeks: 1st degree heart block s/p IV IGG and steroid treatment.  C3 and C4 elevated 07/18/14, normal 07/2013. Early glucola WNL Thyroid testing WNL SCDs during labor/post-partum until ambulating (vs Lovenox or heparin) Recommended limited weight gain GBS positive at 28 weeks--Rx in labor BMZ x 2 doses at 26 weeks.   OB History   Grav Para Term Preterm Abortions TAB SAB Ect Mult Living   2    1  1        2010--SAB at 6 weeks.  Past Medical History  Diagnosis Date  . 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 HSV infection   . Asthma   . Lupus     dx age 28  . Headache(784.0)   . Hypertension     hx of elevations, never been on meds  . Infection     UTI  . Anxiety     doing good now   Past Surgical History  Procedure Laterality Date  .  Tonsillectomy and adenoidectomy  1998  . Mouth surgery      2 teeth removed- 1 wisdom and 1 in front of it  . Wisdom tooth extraction     Family History: family history includes Asthma in her brother and mother; Cancer in her maternal grandfather and paternal grandmother; Cancer (age of onset: 56) in her maternal grandmother; Diabetes in her maternal aunt and maternal grandmother; Hypertension in her maternal grandfather. There is no history of Anesthesia problems, Hypotension, Malignant hyperthermia, or Pseudochol deficiency.  Social History:  reports that she has never smoked. She has never used smokeless tobacco. She reports that she  does not drink alcohol or use illicit drugs.  Her mother is present and supportive.  Patient is employed as a Museum/gallery conservator.  FOB is also present and supportive.   Prenatal Transfer Tool  Maternal Diabetes: No Genetic Screening: Normal 1st trimester screen, declined AFP Maternal Ultrasounds/Referrals: Normal Fetal Ultrasounds or other Referrals:  Fetal echo WNL 10/01/13, with resolution of 1st degree fetal heart block (was treated with IVIG and Dexamethasone) Maternal Substance Abuse:  No Significant Maternal Medications:  Meds include: Other: Plaquenil, Dexamethasone course 4 mg daily--has single dose left to complete course. Significant Maternal Lab Results: Lab values include: Group B Strep positive    ROS:  Contractions, bloody show, +FM.  Allergies  Allergen Reactions  . Bactrim Anaphylaxis and Hives  . Prednisone Anaphylaxis and Hives  . Other Itching    Shrimp:throat itching "can eat other shellfish"  . Pineapple Itching  . Procardia [Nifedipine] Swelling    Swelling of tongue  . Vicodin [Hydrocodone-Acetaminophen] Hives and Swelling    Can take Percocet without difficulty     Dilation: 2 Effacement (%): 70 Station: -1 Exam by:: Nigel Bridgeman [name] Last menstrual period 01/20/2013.  Chest clear Heart RRR without murmur Abd gravid, NT, FH 39 cm Pelvic: As above Ext: WNL  FHR: Category 2--decreased variability, no decels. UCs:  q 3 min, moderate  Prenatal labs: ABO, Rh: O/Positive/-- (08/13 0000) Antibody: Negative (08/13 0000) Rubella:    RPR: Nonreactive (08/13 0000)  HBsAg: Negative (08/13 0000)  HIV: Non-reactive (08/13 0000)  GBS: Positive (04/04 0000) Sickle cell/Hgb electrophoresis:  WNL Pap:  03/2012 WNL GC:  Negative 02/2013, 05/2013, 08/2013, 09/2013 Chlamydia:   Negative 02/2013, 05/2013, 08/2013, 09/2013 Genetic screenings:  Normal 1st trimester screen, declined AFP Glucola:  WNL Other:  C3 191 and C4 44 (elevated) 07/18/14; Complement C3 and  C4 WNL 08/13/2013, SSRO +, 24 hour urine protein negative 08/07/13.  FFN negative 07/15/14.  + Ecoli urine culture  Hgb 12.1 at NOB, 12.7 at glucola TSH WNL 111/11/14 PIH labs WNL 05/29/14 24 hour urine WNL 08/07/13.   Assessment/Plan: IUP at 38 5/7 weeks Early labor GBS positive Elevated BP Lupus Fetus with 1st degree heart block--resolved on last echo 10/01/13. Hx HSV, no recent or current lesions  Plan: Admitted to [facility] per consult with Dr. Charlotta Newton Routine [facility] orders PIH labs, protein/creatnine ratio Plans epidural    Hayley Webb, [place], [place] 10/25/2013, 12:18 AM

## 2013-10-25 NOTE — Anesthesia Preprocedure Evaluation (Signed)
Anesthesia Evaluation  Patient identified by MRN, date of birth, ID band Patient awake    Reviewed: Allergy & Precautions, H&P , NPO status , Patient's Chart, lab work & pertinent test results  Airway Mallampati: II TM Distance: >3 FB Neck ROM: full    Dental no notable dental hx.    Pulmonary    Pulmonary exam normal       Cardiovascular hypertension, negative cardio ROS      Neuro/Psych    GI/Hepatic negative GI ROS, Neg liver ROS,   Endo/Other  Morbid obesity  Renal/GU negative Renal ROS     Musculoskeletal   Abdominal (+) + obese,   Peds  Hematology negative hematology ROS (+)   Anesthesia Other Findings   Reproductive/Obstetrics (+) Pregnancy                           Anesthesia Physical Anesthesia Plan  ASA: III  Anesthesia Plan: Epidural   Post-op Pain Management:    Induction:   Airway Management Planned:   Additional Equipment:   Intra-op Plan:   Post-operative Plan:   Informed Consent: I have reviewed the patients History and Physical, chart, labs and discussed the procedure including the risks, benefits and alternatives for the proposed anesthesia with the patient or authorized representative who has indicated his/her understanding and acceptance.     Plan Discussed with:   Anesthesia Plan Comments:         Anesthesia Quick Evaluation

## 2013-10-25 NOTE — Anesthesia Procedure Notes (Signed)
Epidural Patient location during procedure: OB Start time: 10/25/2013 6:08 AM End time: 10/25/2013 6:12 AM  Staffing Anesthesiologist: Leilani AbleHATCHETT, Malvina Schadler Performed by: anesthesiologist   Preanesthetic Checklist Completed: patient identified, surgical consent, pre-op evaluation, timeout performed, IV checked, risks and benefits discussed and monitors and equipment checked  Epidural Patient position: sitting Prep: site prepped and draped and DuraPrep Patient monitoring: continuous pulse ox and blood pressure Approach: midline Location: L3-L4 Injection technique: LOR air  Needle:  Needle type: Tuohy  Needle gauge: 17 G Needle length: 9 cm and 9 Needle insertion depth: 9 cm Catheter type: closed end flexible Catheter size: 19 Gauge Catheter at skin depth: 15 cm Test dose: negative and Other  Assessment Sensory level: T9 Events: blood not aspirated, injection not painful, no injection resistance, negative IV test and no paresthesia  Additional Notes Reason for block:procedure for pain

## 2013-10-25 NOTE — Progress Notes (Signed)
Subjective: Breathing with contractions, coping well.  Declines need for epidural at present. Family at bedside, FOB also accompanying.  Patient and FOB not currently in relationship.  Objective: BP 144/98  Pulse 116  Temp(Src) 97.7 F (36.5 C) (Oral)  Resp 18  Ht 5\' 8"  (1.727 m)  Wt 314 lb (142.429 kg)  BMI 47.75 kg/m2  LMP 01/20/2013      Filed Vitals:   10/25/13 0018 10/25/13 0051 10/25/13 0102  BP: 142/96 144/98   Pulse: 123 116   Temp:  97.7 F (36.5 C)   TempSrc:  Oral   Resp:  18   Height:   5\' 8"  (1.727 m)  Weight:   314 lb (142.429 kg)   Received 1st dose PCN at 0107  FHT: Category 1 UC:   irregular, every 2-5 minutes, moderate--difficult to trace due to body habitus. SVE:  Deferred at present  Results for orders placed during the hospital encounter of 10/24/13 (from the past 24 hour(s))  OB RESULTS CONSOLE GBS     Status: None   Collection Time    10/25/13 12:00 AM      Result Value Ref Range   GBS Positive    CBC     Status: Abnormal   Collection Time    10/25/13  1:00 AM      Result Value Ref Range   WBC 6.5  4.0 - 10.5 K/uL   RBC 4.07  3.87 - 5.11 MIL/uL   Hemoglobin 12.1  12.0 - 15.0 g/dL   HCT 16.1 (*) 09.6 - 04.5 %   MCV 86.7  78.0 - 100.0 fL   MCH 29.7  26.0 - 34.0 pg   MCHC 34.3  30.0 - 36.0 g/dL   RDW 40.9  81.1 - 91.4 %   Platelets 284  150 - 400 K/uL  COMPREHENSIVE METABOLIC PANEL     Status: Abnormal   Collection Time    10/25/13  1:00 AM      Result Value Ref Range   Sodium 136 (*) 137 - 147 mEq/L   Potassium 3.8  3.7 - 5.3 mEq/L   Chloride 100  96 - 112 mEq/L   CO2 19  19 - 32 mEq/L   Glucose, Bld 102 (*) 70 - 99 mg/dL   BUN 9  6 - 23 mg/dL   Creatinine, Ser 7.82  0.50 - 1.10 mg/dL   Calcium 9.6  8.4 - 95.6 mg/dL   Total Protein 6.7  6.0 - 8.3 g/dL   Albumin 2.9 (*) 3.5 - 5.2 g/dL   AST 19  0 - 37 U/L   ALT 11  0 - 35 U/L   Alkaline Phosphatase 93  39 - 117 U/L   Total Bilirubin 0.3  0.3 - 1.2 mg/dL   GFR calc non Af Amer  >90  >90 mL/min   GFR calc Af Amer >90  >90 mL/min  LACTATE DEHYDROGENASE     Status: None   Collection Time    10/25/13  1:00 AM      Result Value Ref Range   LDH 190  94 - 250 U/L  URIC ACID     Status: None   Collection Time    10/25/13  1:00 AM      Result Value Ref Range   Uric Acid, Serum 3.3  2.4 - 7.0 mg/dL  PROTEIN / CREATININE RATIO, URINE     Status: None   Collection Time    10/25/13  1:25 AM  Result Value Ref Range   Creatinine, Urine 297.06     Total Protein, Urine 28.4     PROTEIN CREATININE RATIO 0.10  0.00 - 0.15    Assessment:  Early labor GBS positive Mild hypertension--normal labs Lupus  Plan: Continue to observe at present Recheck cervix 4-4:30am for update--plan augmentation if no change.  Hayley Webb 10/25/2013, 3:00 AM

## 2013-10-25 NOTE — Progress Notes (Addendum)
  Subjective: Epidural just placed.  SROM occurred at 0535, with clear fluid with bloody show intermixed.  Objective: BP 148/74  Pulse 123  Temp(Src) 98.1 F (36.7 C) (Oral)  Resp 18  Ht 5\' 8"  (1.727 m)  Wt 314 lb (142.429 kg)  BMI 47.75 kg/m2  SpO2 100%  LMP 01/20/2013      Filed Vitals:   10/25/13 0613 10/25/13 0615 10/25/13 0617 10/25/13 0618  BP: 156/102 148/78 148/74   Pulse: 120 117 156 123  Temp:      TempSrc:      Resp:      Height:      Weight:      SpO2:    100%     FHT: Category 1 UC:   regular, every 3 minutes SVE:   Deferred at present Pitocin at 2 mu/min  Assessment:  Early labor GBS positive Lupus Elevated BP Maternal tachycardia  Plan: Await completion of initial epidural monitoring. Labetalol prn for elevated BP Monitor maternal HR. Per consult with Dr. Charlotta Newtonzan, will give Dexamethasone 4 mg dose today to complete that course, and Plaquenil 100 mg po q day. Dr. Charlotta Newtonzan will determine if stress dose of steroid is needed during labor.  Nigel BridgemanLATHAM, Hayley Webb 10/25/2013, 6:21 AM

## 2013-10-25 NOTE — MAU Note (Signed)
Assisted J. Oxley, [name] with access to this record/FHR strip/order entry while we were in OR and she was sterile.

## 2013-10-25 NOTE — Progress Notes (Signed)
  Subjective: Pt is comfortable with epidural.  Family at bedside and supportive.  Objective: BP 141/89  Pulse 109  Temp(Src) 98.5 F (36.9 C) (Oral)  Resp 16  Ht 5\' 8"  (1.727 m)  Wt 314 lb (142.429 kg)  BMI 47.75 kg/m2  SpO2 100%  LMP 01/20/2013      FHT: Category I UC:   regular, every 2-3 minutes  SVE:   Dilation: 2 Effacement (%): 70 Station: -1 Exam by:: Chip BoerVicki [name]     Augmentation of labor, progressing well  Labor: Progressing on Pitocin, Pitocin at 3 miliU  Preeclampsia: no signs or symptoms of toxicity, labs stable and [facility]elol order [facility]  Fetal Wellbeing: Category I  Pain Control: Epidural  I/D: GBS pos; PCN x 2 doses; SROM at 0539; Afebrile  Anticipated MOD: NSVD    Mckinnon Glick 10/25/2013, 7:23 AM

## 2013-10-25 NOTE — Progress Notes (Signed)
  Subjective: Feeling pressure, encouraged to push epidural button.  Family at bedside and supportive.  Objective: BP 141/89  Pulse 109  Temp(Src) 98.5 F (36.9 C) (Oral)  Resp 16  Ht 5\' 8"  (1.727 m)  Wt 314 lb (142.429 kg)  BMI 47.75 kg/m2  SpO2 100%  LMP 01/20/2013      FHT: Category II UC:   regular, every 1-2 minutes  Dilation: Lip/rim Effacement (%): 100 Cervical Position: Posterior Station: +2 Presentation: Vertex Exam by:: JOxley,cnm   Augmentation of labor, progressing well  Labor: Progressing on Pitocin, Pitocin at 6 miliU  Preeclampsia: no signs or symptoms of toxicity  Fetal Wellbeing: Category II  Pain Control: Epidural  I/D: GBS pos; PCN; SROM at 0539; Afebrile    Anticipated MOD: NSVD    Hayley Webb 10/25/2013, 7:23 AM

## 2013-10-25 NOTE — Progress Notes (Signed)
  Subjective: Pt is comfortable with epidural.  Family at bedside and supportive.  Pt reports some pressure.  Objective: BP 141/89  Pulse 109  Temp(Src) 98.5 F (36.9 C) (Oral)  Resp 16  Ht 5\' 8"  (1.727 m)  Wt 314 lb (142.429 kg)  BMI 47.75 kg/m2  SpO2 100%  LMP 01/20/2013      FHT: Category I UC:   regular, every 1-3 minutes Dilation: 4 Effacement (%): 100 Cervical Position: Posterior Station: -1 Presentation: Vertex Exam by:: j Cherylyn Sundby,cnm  Augmentation of labor, progressing well  Labor: Progressing on Pitocin, Pitocin 13 miliU Preeclampsia: no signs or symptoms of toxicity  Fetal Wellbeing: Category I  Pain Control: Epidural  I/D: GBS pos; PCN x 2 doses; SROM at 0539; Afebrile    Anticipated MOD: NSVD    Hayley Webb 10/25/2013, 7:23 AM

## 2013-10-26 LAB — CBC
HEMATOCRIT: 33.4 % — AB (ref 36.0–46.0)
HEMOGLOBIN: 11.4 g/dL — AB (ref 12.0–15.0)
MCH: 29.8 pg (ref 26.0–34.0)
MCHC: 34.1 g/dL (ref 30.0–36.0)
MCV: 87.2 fL (ref 78.0–100.0)
Platelets: 296 10*3/uL (ref 150–400)
RBC: 3.83 MIL/uL — ABNORMAL LOW (ref 3.87–5.11)
RDW: 14.4 % (ref 11.5–15.5)
WBC: 17.1 10*3/uL — AB (ref 4.0–10.5)

## 2013-10-26 MED ORDER — DEXAMETHASONE 4 MG PO TABS
4.0000 mg | ORAL_TABLET | Freq: Four times a day (QID) | ORAL | Status: AC
  Administered 2013-10-26 (×2): 4 mg via ORAL
  Filled 2013-10-26 (×2): qty 1

## 2013-10-26 MED ORDER — OXYCODONE-ACETAMINOPHEN 5-325 MG PO TABS
1.0000 | ORAL_TABLET | ORAL | Status: DC | PRN
  Administered 2013-10-26 (×2): 1 via ORAL
  Filled 2013-10-26: qty 1

## 2013-10-26 NOTE — Progress Notes (Signed)
Clinical Social Work Department PSYCHOSOCIAL ASSESSMENT - MATERNAL/CHILD 10/26/2013  Patient:  Hayley Webb,Hayley Webb  Account Number:  [id number]  Admit Date:  10/24/2013  Childs Name:   Hayley Webb    Clinical Social Worker:   , LCSW   Date/Time:  10/26/2013 04:00 PM  Date Referred:  10/25/2013      Referred reason  Behavioral Health Issues   Other referral source:    I:  FAMILY / HOME ENVIRONMENT Child's legal guardian:  PARENT  Guardian - Name Guardian - Age Guardian - Address  Hayley Webb,[name] [address]  [place], [place] [address]  Hayley Webb 30    Other household support members/support persons Other support:    II  PSYCHOSOCIAL DATA Information Source:    Financial and Community Resources Employment:   Both parents employed   Financial resources:  Medicaid If Medicaid - County:   Other  WIC  Food Stamps   School / Grade:   Maternity Care Coordinator / Child Services Coordination / Early Interventions:  Cultural issues impacting care:    III  STRENGTHS Strengths  Supportive family/friends  Home prepared for Child (including basic supplies)  Adequate Resources   Strength comment:    IV  RISK FACTORS AND CURRENT PROBLEMS Current Problem:       V  SOCIAL WORK ASSESSMENT Acknowledged order for Social Work consult to assess mother's history of anxiety.  Met mother who is a single parent with no other dependents.   Mother states that she and FOB are no longer in a relationship and that although he is listed as the support person, and has been to the hospital, he is of little support.  She describes her pregnancy as being stressful due to FOB behavior.  Mother states that she was also stress because she was told during pregnancy that her baby had a heart block and FOB was of no support.  Allowed her to vent.  Provided supportive feedback. Informed that security was called yesterday because he started yelling in the room.  She reports no hx of  domestic violence.   Maternal grandmother is living with her and is supportive, and will take care of newborn when she returns to work.   Mother reports hx of anxiety noting that she was diagnosed with this around the time that she was also diagnosed with lupus.   She denies any hx of psychiatric hospitalization, or need for anti-anxiety medication. Mother reports no currently symptoms of anxiety or depression.   Discussed signs/symptoms of PP Depression, and provided her with information/resources if needed. She also denies any hx of substance abuse.  No acute social concerns noted at this time.   Informed parents of CSW availability.      VI SOCIAL WORK PLAN Social Work Plan  No Further Intervention Required / No Barriers to Discharge     

## 2013-10-26 NOTE — Progress Notes (Signed)
Patient ID: Hayley Sheffieldndrea Foster, female   DOB: [birth year], 28 y.o.   MRN: [id number] Post Partum Day 1, VAVD, w 1st degree laceration  Subjective: no complaints, up ad lib without syncope, voiding, tolerating PO,  Pain well controlled with po meds,  BF initiated  Mood stable, bonding well Contraception: undecided but considering mirena    Objective: Blood pressure 133/82, pulse 101, temperature 98.3 F (36.8 C), temperature source Oral, resp. rate 18, height 5\' 8"  (1.727 m), weight 314 lb (142.429 kg), last menstrual period 01/20/2013, SpO2 100.00%, unknown if currently breastfeeding.  Physical Exam:  General: NAD  Lochia: appropriate Uterine Fundus: firm Perineum: healing  DVT Evaluation: No evidence of DVT seen on physical exam. Negative Homan's sign Mild bilateral LEE    Recent Labs  10/25/13 0100 10/26/13 0607  HGB 12.1 11.4*  HCT 35.3* 33.4*    Assessment/Plan:  Stable PPD1 Lupus - stable, has plan to f/u rheumatology, was in process of switching to new group C/w Dr Charlotta Newtonzan, will stay on steroids until 24hrs after   Plan for discharge tomorrow and Breastfeeding Plans outpatient circ       LOS: 2 days   Jakhai Fant M 10/26/2013, 9:35 AM

## 2013-10-26 NOTE — Lactation Note (Signed)
This note was copied from the chart of Hayley Webb. Lactation Consultation Note  Patient Name: Hayley Webb ZOXWR'UToday's Date: 10/26/2013 Reason for consult: Follow-up assessment  Mom reports baby is nursing well, denies tenderness, reports hearing swallows. Baby asleep at this visit. Advised Mom to call if she would like assist.  Maternal Data    Feeding Feeding Type: Breast Fed Length of feed: 23 min  LATCH Score/Interventions                      Lactation Tools Discussed/Used     Consult Status Date: 10/27/13 Follow-up type: In-patient    Alfred LevinsGranger, Gurjot Brisco Ann 10/26/2013, 9:04 PM

## 2013-10-26 NOTE — Anesthesia Postprocedure Evaluation (Signed)
Anesthesia Post Note  Patient: Hayley Webb  Procedure(s) Performed: * No procedures listed *  Anesthesia type: Epidural  Patient location: Mother/Baby  Post pain: Pain level controlled  Post assessment: Post-op Vital signs reviewed  Last Vitals:  Filed Vitals:   10/26/13 0645  BP: 133/82  Pulse: 101  Temp: 36.8 C  Resp: 18    Post vital signs: Reviewed  Level of consciousness:alert  Complications: No apparent anesthesia complications

## 2013-10-27 NOTE — Progress Notes (Signed)
Ur chart review completed post discharge.  

## 2013-10-27 NOTE — Discharge Summary (Signed)
Vaginal Delivery Discharge Summary  Hayley Webb  DOB:    [birth year] MRN:    [id number] CSN:    [id number]  Date of admission:                  10/24/2013   Date of discharge:                   10/27/2013  Procedures this admission:  Date of Delivery: 10/25/2013  Newborn Data:  Live born female  Birth Weight: 6 lb 11.9 oz (3060 g) APGAR: 9, 9  Home with mother. Name: [facility]  Circumcision Plan: Outpatient  History of Present Illness:  Ms. Kajuana Shareef is a 28 y.o. female, G2P1011, who presents at [redacted]w[redacted]d weeks gestation. The patient has been followed at the [facility] division of [facility]. She was admitted onset of labor. Her pregnancy has been complicated by:  Patient Active Problem List   Diagnosis Date Noted  . Vaginal delivery--VE assist 10/25/2013  . 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 Obesity-BMI 47 05/09/2013  . Hirsutism 10/02/2012  . STD (female) 01/18/2012  . Lupus   . Asthma   . HSV infection     Hospital course:  The patient was admitted for active labor.   Her labor was not complicated. She proceeded to have a vaginal delivery of a healthy infant. Her delivery was not complicated, but assisted with vacuum extraction. Her postpartum course was not complicated.  She was discharged to home on postpartum day 2 doing well.  Feeding:  breast  Contraception:  IUD  Discharge hemoglobin:  Hemoglobin  Date Value Ref Range Status  10/26/2013 11.4* 12.0 - 15.0 g/dL Final     HCT  Date Value Ref Range Status  10/26/2013 33.4* 36.0 - 46.0 % Final    Discharge Physical Exam:   General: alert, cooperative and no distress Lochia: appropriate Uterine Fundus: firm, U/-2 Incision: N/A DVT Evaluation: No evidence of DVT seen on physical exam. Negative Homan's sign. Calf/Ankle edema is present.  Intrapartum Procedures: vacuum and GBS  prophylaxis Postpartum Procedures: none Complications-Operative and Postpartum: 1st degree perineal laceration  Discharge Diagnoses: Term Pregnancy-delivered  Discharge Information:  Activity:           pelvic rest Diet:                routine Medications: None Condition:      stable Instructions:   Postpartum Care After Vaginal Delivery  After you deliver your newborn (postpartum period), the usual stay in the hospital is 24 72 hours. If there were problems with your labor or delivery, or if you have other medical problems, you might be in the hospital longer.  While you are in the hospital, you will receive help and instructions on how to care for yourself and your newborn during the postpartum period.  While you are in the hospital:  Be sure to tell your nurses if you have pain or discomfort, as well as where you feel the pain and what makes the pain worse.  If you had an incision made near your vagina (episiotomy) or if you had some tearing during delivery, the nurses may put ice packs on your episiotomy or tear. The ice packs may help to reduce the pain and swelling.  If you are breastfeeding, you may feel uncomfortable contractions of your uterus for a couple of weeks. This is normal. The contractions  help your uterus get back to normal size.  It is normal to have some bleeding after delivery.  For the first 1 3 days after delivery, the flow is red and the amount may be similar to a period.  It is common for the flow to start and stop.  In the first few days, you may pass some small clots. Let your nurses know if you begin to pass large clots or your flow increases.  Do not  flush blood clots down the toilet before having the nurse look at them.  During the next 3 10 days after delivery, your flow should become more watery and pink or brown-tinged in color.  Ten to fourteen days after delivery, your flow should be a small amount of yellowish-white discharge.  The amount  of your flow will decrease over the first few weeks after delivery. Your flow may stop in 6 8 weeks. Most women have had their flow stop by 12 weeks after delivery.  You should change your sanitary pads frequently.  Wash your hands thoroughly with soap and water for at least 20 seconds after changing pads, using the toilet, or before holding or feeding your newborn.  You should feel like you need to empty your bladder within the first 6 8 hours after delivery.  In case you become weak, lightheaded, or faint, call your nurse before you get out of bed for the first time and before you take a shower for the first time.  Within the first few days after delivery, your breasts may begin to feel tender and full. This is called engorgement. Breast tenderness usually goes away within 48 72 hours after engorgement occurs. You may also notice milk leaking from your breasts. If you are not breastfeeding, do not stimulate your breasts. Breast stimulation can make your breasts produce more milk.  Spending as much time as possible with your newborn is very important. During this time, you and your newborn can feel close and get to know each other. Having your newborn stay in your room (rooming in) will help to strengthen the bond with your newborn. It will give you time to get to know your newborn and become comfortable caring for your newborn.  Your hormones change after delivery. Sometimes the hormone changes can temporarily cause you to feel sad or tearful. These feelings should not last more than a few days. If these feelings last longer than that, you should talk to your caregiver.  If desired, talk to your caregiver about methods of family planning or contraception.  Talk to your caregiver about immunizations. Your caregiver may want you to have the following immunizations before leaving the hospital:  Tetanus, diphtheria, and pertussis (Tdap) or tetanus and diphtheria (Td) immunization. It is very  important that you and your family (including grandparents) or others caring for your newborn are up-to-date with the Tdap or Td immunizations. The Tdap or Td immunization can help protect your newborn from getting ill.  Rubella immunization.  Varicella (chickenpox) immunization.  Influenza immunization. You should receive this annual immunization if you did not receive the immunization during your pregnancy. Document Released: 05/07/2007 Document Revised: 04/03/2012 Document Reviewed: 03/06/2012 [facility] Patient Information 2014 [place], [place].   Postpartum Depression and Baby Blues  The postpartum period begins right after the birth of a baby. During this time, there is often a great amount of joy and excitement. It is also a time of considerable changes in the life of the parent(s). Regardless of how many  times a mother gives birth, each child brings new challenges and dynamics to the family. It is not unusual to have feelings of excitement accompanied by confusing shifts in moods, emotions, and thoughts. All mothers are at risk of developing postpartum depression or the "baby blues." These mood changes can occur right after giving birth, or they may occur many months after giving birth. The baby blues or postpartum depression can be mild or severe. Additionally, postpartum depression can resolve rather quickly, or it can be a long-term condition. CAUSES Elevated hormones and their rapid decline are thought to be a main cause of postpartum depression and the baby blues. There are a number of hormones that radically change during and after pregnancy. Estrogen and progesterone usually decrease immediately after delivering your baby. The level of thyroid hormone and various cortisol steroids also rapidly drop. Other factors that play a major role in these changes include major life events and genetics.  RISK FACTORS If you have any of the following risks for the baby blues or postpartum depression,  know what symptoms to watch out for during the postpartum period. Risk factors that may increase the likelihood of getting the baby blues or postpartum depression include:  Havinga personal or family history of depression.  Having depression while being pregnant.  Having premenstrual or oral contraceptive-associated mood issues.  Having exceptional life stress.  Having marital conflict.  Lacking a social support network.  Having a baby with special needs.  Having health problems such as diabetes. SYMPTOMS Baby blues symptoms include:  Brief fluctuations in mood, such as going from extreme happiness to sadness.  Decreased concentration.  Difficulty sleeping.  Crying spells, tearfulness.  Irritability.  Anxiety. Postpartum depression symptoms typically begin within the first month after giving birth. These symptoms include:  Difficulty sleeping or excessive sleepiness.  Marked weight loss.  Agitation.  Feelings of worthlessness.  Lack of interest in activity or food. Postpartum psychosis is a very concerning condition and can be dangerous. Fortunately, it is rare. Displaying any of the following symptoms is cause for immediate medical attention. Postpartum psychosis symptoms include:  Hallucinations and delusions.  Bizarre or disorganized behavior.  Confusion or disorientation. DIAGNOSIS  A diagnosis is made by an evaluation of your symptoms. There are no medical or lab tests that lead to a diagnosis, but there are various questionnaires that a caregiver may use to identify those with the baby blues, postpartum depression, or psychosis. Often times, a screening tool called the [place] Postnatal Depression Scale is used to diagnose depression in the postpartum period.  TREATMENT The baby blues usually goes away on its own in 1 to 2 weeks. Social support is often all that is needed. You should be encouraged to get adequate sleep and rest. Occasionally, you may be  given medicines to help you sleep.  Postpartum depression requires treatment as it can last several months or longer if it is not treated. Treatment may include individual or group therapy, medicine, or both to address any social, physiological, and psychological factors that may play a role in the depression. Regular exercise, a healthy diet, rest, and social support may also be strongly recommended.  Postpartum psychosis is more serious and needs treatment right away. Hospitalization is often needed. HOME CARE INSTRUCTIONS  Get as much rest as you can. Nap when the baby sleeps.  Exercise regularly. Some women find yoga and walking to be beneficial.  Eat a balanced and nourishing diet.  Do little things that you enjoy. Have a  cup of tea, take a bubble bath, read your favorite magazine, or listen to your favorite music.  Avoid alcohol.  Ask for help with household chores, cooking, grocery shopping, or running errands as needed. Do not try to do everything.  Talk to people close to you about how you are feeling. Get support from your partner, family members, friends, or other new moms.  Try to stay positive in how you think. Think about the things you are grateful for.  Do not spend a lot of time alone.  Only take medicine as directed by your caregiver.  Keep all your postpartum appointments.  Let your caregiver know if you have any concerns. SEEK MEDICAL CARE IF: You are having a reaction or problems with your medicine. SEEK IMMEDIATE MEDICAL CARE IF:  You have suicidal feelings.  You feel you may harm the baby or someone else. Document Released: 04/13/2004 Document Revised: 10/02/2011 Document Reviewed: 05/16/2011 [facility] Patient Information 2014 [place], [place].   Discharge to: home  Follow-up Information   Follow up with [facility] In 5 weeks. (Call if you have any questions or concerns prior to your visit. )    Specialty:  Obstetrics and  Gynecology   Contact information:   [address] [place] [address] [phone]       Gerrit Heck [facility] 10/27/2013

## 2013-10-27 NOTE — Discharge Instructions (Signed)
Levonorgestrel intrauterine device (IUD) What is this medicine? LEVONORGESTREL IUD (LEE voe nor jes trel) is a contraceptive (birth control) device. The device is placed inside the uterus by a healthcare professional. It is used to prevent pregnancy and can also be used to treat heavy bleeding that occurs during your period. Depending on the device, it can be used for 3 to 5 years. This medicine may be used for other purposes; ask your health care provider or pharmacist if you have questions. COMMON BRAND NAME(S): Gretta Cool What should I tell my health care provider before I take this medicine? They need to know if you have any of these conditions: -abnormal Pap smear -cancer of the breast, uterus, or cervix -diabetes -endometritis -genital or pelvic infection now or in the past -have more than one sexual partner or your partner has more than one partner -heart disease -history of an ectopic or tubal pregnancy -immune system problems -IUD in place -liver disease or tumor -problems with blood clots or take blood-thinners -use intravenous drugs -uterus of unusual shape -vaginal bleeding that has not been explained -an unusual or allergic reaction to levonorgestrel, other hormones, silicone, or polyethylene, medicines, foods, dyes, or preservatives -pregnant or trying to get pregnant -breast-feeding How should I use this medicine? This device is placed inside the uterus by a health care professional. Talk to your pediatrician regarding the use of this medicine in children. Special care may be needed. Overdosage: If you think you have taken too much of this medicine contact a poison control center or emergency room at once. NOTE: This medicine is only for you. Do not share this medicine with others. What if I miss a dose? This does not apply. What may interact with this medicine? Do not take this medicine with any of the following  medications: -amprenavir -bosentan -fosamprenavir This medicine may also interact with the following medications: -aprepitant -barbiturate medicines for inducing sleep or treating seizures -bexarotene -griseofulvin -medicines to treat seizures like carbamazepine, ethotoin, felbamate, oxcarbazepine, phenytoin, topiramate -modafinil -pioglitazone -rifabutin -rifampin -rifapentine -some medicines to treat HIV infection like atazanavir, indinavir, lopinavir, nelfinavir, tipranavir, ritonavir -St. John's wort -warfarin This list may not describe all possible interactions. Give your health care provider a list of all the medicines, herbs, non-prescription drugs, or dietary supplements you use. Also tell them if you smoke, drink alcohol, or use illegal drugs. Some items may interact with your medicine. What should I watch for while using this medicine? Visit your doctor or health care professional for regular check ups. See your doctor if you or your partner has sexual contact with others, becomes HIV positive, or gets a sexual transmitted disease. This product does not protect you against HIV infection (AIDS) or other sexually transmitted diseases. You can check the placement of the IUD yourself by reaching up to the top of your vagina with clean fingers to feel the threads. Do not pull on the threads. It is a good habit to check placement after each menstrual period. Call your doctor right away if you feel more of the IUD than just the threads or if you cannot feel the threads at all. The IUD may come out by itself. You may become pregnant if the device comes out. If you notice that the IUD has come out use a backup birth control method like condoms and call your health care provider. Using tampons will not change the position of the IUD and are okay to use during your period. What side effects may I  notice from receiving this medicine? Side effects that you should report to your doctor or  health care professional as soon as possible: -allergic reactions like skin rash, itching or hives, swelling of the face, lips, or tongue -fever, flu-like symptoms -genital sores -high blood pressure -no menstrual period for 6 weeks during use -pain, swelling, warmth in the leg -pelvic pain or tenderness -severe or sudden headache -signs of pregnancy -stomach cramping -sudden shortness of breath -trouble with balance, talking, or walking -unusual vaginal bleeding, discharge -yellowing of the eyes or skin Side effects that usually do not require medical attention (report to your doctor or health care professional if they continue or are bothersome): -acne -breast pain -change in sex drive or performance -changes in weight -cramping, dizziness, or faintness while the device is being inserted -headache -irregular menstrual bleeding within first 3 to 6 months of use -nausea This list may not describe all possible side effects. Call your doctor for medical advice about side effects. You may report side effects to FDA at 1-800-FDA-1088. Where should I keep my medicine? This does not apply. NOTE: This sheet is a summary. It may not cover all possible information. If you have questions about this medicine, talk to your doctor, pharmacist, or health care provider.  2014, [employer]vier/Gold Standard. (2011-08-10 13:54:04) Postpartum Depression and Baby Blues The postpartum period begins right after the birth of a baby. During this time, there is often a great amount of joy and excitement. It is also a time of considerable changes in the life of the parent(s). Regardless of how many times a mother gives birth, each child brings new challenges and dynamics to the family. It is not unusual to have feelings of excitement accompanied by confusing shifts in moods, emotions, and thoughts. All mothers are at risk of developing postpartum depression or the "baby blues." These mood changes can occur right  after giving birth, or they may occur many months after giving birth. The baby blues or postpartum depression can be mild or severe. Additionally, postpartum depression can resolve rather quickly, or it can be a long-term condition. CAUSES Elevated hormones and their rapid decline are thought to be a main cause of postpartum depression and the baby blues. There are a number of hormones that radically change during and after pregnancy. Estrogen and progesterone usually decrease immediately after delivering your baby. The level of thyroid hormone and various cortisol steroids also rapidly drop. Other factors that play a major role in these changes include major life events and genetics.  RISK FACTORS If you have any of the following risks for the baby blues or postpartum depression, know what symptoms to watch out for during the postpartum period. Risk factors that may increase the likelihood of getting the baby blues or postpartum depression include:  Havinga personal or family history of depression.  Having depression while being pregnant.  Having premenstrual or oral contraceptive-associated mood issues.  Having exceptional life stress.  Having marital conflict.  Lacking a social support network.  Having a baby with special needs.  Having health problems such as diabetes. SYMPTOMS Baby blues symptoms include:  Brief fluctuations in mood, such as going from extreme happiness to sadness.  Decreased concentration.  Difficulty sleeping.  Crying spells, tearfulness.  Irritability.  Anxiety. Postpartum depression symptoms typically begin within the first month after giving birth. These symptoms include:  Difficulty sleeping or excessive sleepiness.  Marked weight loss.  Agitation.  Feelings of worthlessness.  Lack of interest in activity or food. Postpartum  psychosis is a very concerning condition and can be dangerous. Fortunately, it is rare. Displaying any of the following  symptoms is cause for immediate medical attention. Postpartum psychosis symptoms include:  Hallucinations and delusions.  Bizarre or disorganized behavior.  Confusion or disorientation. DIAGNOSIS  A diagnosis is made by an evaluation of your symptoms. There are no medical or lab tests that lead to a diagnosis, but there are various questionnaires that a caregiver may use to identify those with the baby blues, postpartum depression, or psychosis. Often times, a screening tool called the [place] Postnatal Depression Scale is used to diagnose depression in the postpartum period.  TREATMENT The baby blues usually goes away on its own in 1 to 2 weeks. Social support is often all that is needed. You should be encouraged to get adequate sleep and rest. Occasionally, you may be given medicines to help you sleep.  Postpartum depression requires treatment as it can last several months or longer if it is not treated. Treatment may include individual or group therapy, medicine, or both to address any social, physiological, and psychological factors that may play a role in the depression. Regular exercise, a healthy diet, rest, and social support may also be strongly recommended.  Postpartum psychosis is more serious and needs treatment right away. Hospitalization is often needed. HOME CARE INSTRUCTIONS  Get as much rest as you can. Nap when the baby sleeps.  Exercise regularly. Some women find yoga and walking to be beneficial.  Eat a balanced and nourishing diet.  Do little things that you enjoy. Have a cup of tea, take a bubble bath, read your favorite magazine, or listen to your favorite music.  Avoid alcohol.  Ask for help with household chores, cooking, grocery shopping, or running errands as needed. Do not try to do everything.  Talk to people close to you about how you are feeling. Get support from your partner, family members, friends, or other new moms.  Try to stay positive in how you  think. Think about the things you are grateful for.  Do not spend a lot of time alone.  Only take medicine as directed by your caregiver.  Keep all your postpartum appointments.  Let your caregiver know if you have any concerns. SEEK MEDICAL CARE IF: You are having a reaction or problems with your medicine. SEEK IMMEDIATE MEDICAL CARE IF:  You have suicidal feelings.  You feel you may harm the baby or someone else. Document Released: 04/13/2004 Document Revised: 10/02/2011 Document Reviewed: 04/21/2013 [facility] Patient Information 2014 [place], [place]. Postpartum Care After Vaginal Delivery After you deliver your newborn (postpartum period), the usual stay in the hospital is 24 72 hours. If there were problems with your labor or delivery, or if you have other medical problems, you might be in the hospital longer.  While you are in the hospital, you will receive help and instructions on how to care for yourself and your newborn during the postpartum period.  While you are in the hospital:  Be sure to tell your nurses if you have pain or discomfort, as well as where you feel the pain and what makes the pain worse.  If you had an incision made near your vagina (episiotomy) or if you had some tearing during delivery, the nurses may put ice packs on your episiotomy or tear. The ice packs may help to reduce the pain and swelling.  If you are breastfeeding, you may feel uncomfortable contractions of your uterus for a couple of  weeks. This is normal. The contractions help your uterus get back to normal size.  It is normal to have some bleeding after delivery.  For the first 1 3 days after delivery, the flow is red and the amount may be similar to a period.  It is common for the flow to start and stop.  In the first few days, you may pass some small clots. Let your nurses know if you begin to pass large clots or your flow increases.  Do not  flush blood clots down the toilet before having  the nurse look at them.  During the next 3 10 days after delivery, your flow should become more watery and pink or brown-tinged in color.  Ten to fourteen days after delivery, your flow should be a small amount of yellowish-white discharge.  The amount of your flow will decrease over the first few weeks after delivery. Your flow may stop in 6 8 weeks. Most women have had their flow stop by 12 weeks after delivery.  You should change your sanitary pads frequently.  Wash your hands thoroughly with soap and water for at least 20 seconds after changing pads, using the toilet, or before holding or feeding your newborn.  You should feel like you need to empty your bladder within the first 6 8 hours after delivery.  In case you become weak, lightheaded, or faint, call your nurse before you get out of bed for the first time and before you take a shower for the first time.  Within the first few days after delivery, your breasts may begin to feel tender and full. This is called engorgement. Breast tenderness usually goes away within 48 72 hours after engorgement occurs. You may also notice milk leaking from your breasts. If you are not breastfeeding, do not stimulate your breasts. Breast stimulation can make your breasts produce more milk.  Spending as much time as possible with your newborn is very important. During this time, you and your newborn can feel close and get to know each other. Having your newborn stay in your room (rooming in) will help to strengthen the bond with your newborn. It will give you time to get to know your newborn and become comfortable caring for your newborn.  Your hormones change after delivery. Sometimes the hormone changes can temporarily cause you to feel sad or tearful. These feelings should not last more than a few days. If these feelings last longer than that, you should talk to your caregiver.  If desired, talk to your caregiver about methods of family planning or  contraception.  Talk to your caregiver about immunizations. Your caregiver may want you to have the following immunizations before leaving the hospital:  Tetanus, diphtheria, and pertussis (Tdap) or tetanus and diphtheria (Td) immunization. It is very important that you and your family (including grandparents) or others caring for your newborn are up-to-date with the Tdap or Td immunizations. The Tdap or Td immunization can help protect your newborn from getting ill.  Rubella immunization.  Varicella (chickenpox) immunization.  Influenza immunization. You should receive this annual immunization if you did not receive the immunization during your pregnancy. Document Released: 05/07/2007 Document Revised: 04/03/2012 Document Reviewed: 03/06/2012 [facility] Patient Information 2014 [place], [place].

## 2013-10-28 ENCOUNTER — Inpatient Hospital Stay (HOSPITAL_COMMUNITY): Admission: RE | Admit: 2013-10-28 | Payer: Medicaid Other | Source: Ambulatory Visit

## 2014-03-08 ENCOUNTER — Encounter (HOSPITAL_BASED_OUTPATIENT_CLINIC_OR_DEPARTMENT_OTHER): Payer: Self-pay | Admitting: Emergency Medicine

## 2014-03-08 ENCOUNTER — Emergency Department (HOSPITAL_BASED_OUTPATIENT_CLINIC_OR_DEPARTMENT_OTHER)
Admission: EM | Admit: 2014-03-08 | Discharge: 2014-03-08 | Disposition: A | Discharge status: Home / Self Care | Payer: Medicaid Other | Attending: Emergency Medicine | Admitting: Emergency Medicine

## 2014-03-08 DIAGNOSIS — J45909 Unspecified asthma, uncomplicated: Secondary | ICD-10-CM | POA: Insufficient documentation

## 2014-03-08 DIAGNOSIS — R52 Pain, unspecified: Secondary | ICD-10-CM | POA: Diagnosis present

## 2014-03-08 DIAGNOSIS — M329 Systemic lupus erythematosus, unspecified: Secondary | ICD-10-CM | POA: Diagnosis not present

## 2014-03-08 DIAGNOSIS — Z8659 Personal history of other mental and behavioral disorders: Secondary | ICD-10-CM | POA: Diagnosis not present

## 2014-03-08 DIAGNOSIS — Z8619 Personal history of other infectious and parasitic diseases: Secondary | ICD-10-CM | POA: Diagnosis not present

## 2014-03-08 DIAGNOSIS — Z79899 Other long term (current) drug therapy: Secondary | ICD-10-CM | POA: Insufficient documentation

## 2014-03-08 DIAGNOSIS — R5381 Other malaise: Secondary | ICD-10-CM | POA: Insufficient documentation

## 2014-03-08 DIAGNOSIS — R5383 Other fatigue: Secondary | ICD-10-CM

## 2014-03-08 MED ORDER — ACETAMINOPHEN 500 MG PO TABS
1000.0000 mg | ORAL_TABLET | Freq: Once | ORAL | Status: AC
  Administered 2014-03-08: 1000 mg via ORAL
  Filled 2014-03-08: qty 2

## 2014-03-08 MED ORDER — METHYLPREDNISOLONE 4 MG PO KIT: PACK | ORAL | Status: DC

## 2014-03-08 NOTE — ED Notes (Signed)
Pt reports lupus flair up that began yesterday - pt c/o generalized body aches and fatigue. Pt also admits to rash to feet as well pt associates w/ lupus flair up.

## 2014-03-08 NOTE — Discharge Instructions (Signed)
Take the Medrol dosepak as prescribed. Take Tylenol as directed for aches. Followup with your primary care physician if not improved by the end of this week. Return if your condition worsens for any reason

## 2014-03-08 NOTE — ED Notes (Signed)
Pt discharged to home with family. NAD.  

## 2014-03-08 NOTE — ED Provider Notes (Signed)
CSN: [id number]     Arrival date & time 03/08/14  2006 History   This chart was scribed for Doug Sou, MD by Julian Hy, ED Scribe. The patient was seen in MH07/[facility]07. The patient's care was started at 10:42 PM.     Chief Complaint  Patient presents with  . Generalized Body Aches  . Fatigue   Patient is a 28 y.o. female presenting with weakness. The history is provided by the patient. No language interpreter was used.  Weakness This is a chronic problem. The current episode started 12 to 24 hours ago. The problem occurs constantly. The problem has been gradually worsening.   HPI Comments: Hayley Webb is a 28 y.o. female who presents to the Emergency Department complaining of intermittent, generalized body aches onset 4 months ago. Pt also reports associated generalized weakness. Pt reports this feels like a Lupus flare-up. Pt reports her symptoms have increased since having her son 4 months ago. Pt reports her flare-ups have lasted longer since having her son increasing from 2-3 days to up to 2 weeks. Pt reports she previously saw a rheumatologist but has not seen her due to a change in insurance. Pt reports she is allergic to Prednisone, Vicodin, and Procardia. She does use Medrol dose packs for her lupus flareups. Pt reports tongue swelling when she takes VIcodin and Prednisone. Pt denies she takes ibuprofen due to kidney issues. Pt reports she has asthma. Pt reports she takes Plaquenil daily. Pt reports she was taken off of Prednisone due to side effects and was put on Medrol.  Pt reports she has had Lupus since 2011.  LMP: 7/24 and normal PCP: Doreen Salvage, PA-C  Past Medical History  Diagnosis Date  . 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 HSV infection   . Asthma   . Lupus     dx age 27  . Headache(784.0)   . Infection     UTI  . Anxiety     doing good now   Past Surgical History  Procedure Laterality Date  . Tonsillectomy and adenoidectomy   1998  . Mouth surgery      2 teeth removed- 1 wisdom and 1 in front of it  . Wisdom tooth extraction     Family History  Problem Relation Age of Onset  . Diabetes Maternal Aunt   . Cancer Maternal Grandmother 72    COLON CA  . Diabetes Maternal Grandmother   . Hypertension Maternal Grandfather   . Cancer Maternal Grandfather     prostate  . Asthma Mother   . Asthma Brother   . Anesthesia problems Neg Hx   . Hypotension Neg Hx   . Malignant hyperthermia Neg Hx   . Pseudochol deficiency Neg Hx   . Cancer Paternal Grandmother     breast   History  Substance Use Topics  . Smoking status: Never Smoker   . Smokeless tobacco: Never Used  . Alcohol Use: No     Comment: not with preg   OB History   Grav Para Term Preterm Abortions TAB SAB Ect Mult Living   2 1 1  1  1   1      Review of Systems  HENT: Negative.   Respiratory: Negative.   Cardiovascular: Negative.   Gastrointestinal: Negative.   Musculoskeletal: Positive for myalgias.  Skin: Negative.   Neurological: Positive for weakness.  Psychiatric/Behavioral: Negative.   All other systems reviewed and are negative.  Allergies  Bactrim; Prednisone; Other; Pineapple; Procardia; and Vicodin  Home Medications   Prior to Admission medications   Medication Sig Start Date End Date Taking? Authorizing Provider  albuterol (PROVENTIL HFA;VENTOLIN HFA) 108 (90 BASE) MCG/ACT inhaler Inhale 1-2 puffs into the lungs every 6 (six) hours as needed for wheezing or shortness of breath.   Yes Historical Provider, MD  Hydroxychloroquine Sulfate (PLAQUENIL PO) Take 100 mg by mouth daily.   Yes Historical Provider, MD  Prenatal Vit-Fe Fumarate-FA (PRENATAL MULTIVITAMIN) TABS Take 1 tablet by mouth daily.   Yes Historical Provider, MD  oxyCODONE-acetaminophen (PERCOCET/ROXICET) 5-325 MG per tablet Take 1 tablet by mouth every 4 (four) hours as needed for severe pain. 09/28/13   Nigel BridgemanVicki Latham, [name]   Triage Vitals: BP 139/90  Pulse 88   Temp(Src) 97.9 F (36.6 C) (Oral)  Resp 18  Ht 5\' 8"  (1.727 m)  Wt 296 lb (134.265 kg)  BMI 45.02 kg/m2  SpO2 100%  LMP 02/13/2014 Physical Exam  Nursing note and vitals reviewed. Constitutional: She is oriented to person, place, and time. She appears well-developed and well-nourished.  HENT:  Head: Normocephalic and atraumatic.  Eyes: Conjunctivae are normal. Pupils are equal, round, and reactive to light.  Neck: Neck supple. No tracheal deviation present. No thyromegaly present.  Cardiovascular: Normal rate and regular rhythm.   No murmur heard. Pulmonary/Chest: Effort normal and breath sounds normal.  Abdominal: Soft. Bowel sounds are normal. She exhibits no distension. There is no tenderness.  obese  Musculoskeletal: Normal range of motion. She exhibits no edema and no tenderness.  Neurological: She is alert and oriented to person, place, and time. No cranial nerve deficit. Coordination normal.  Gait normal call walks without limp. Motor strength 5 over 5 overall  Skin: Skin is warm and dry. No rash noted.  Psychiatric: She has a normal mood and affect.    ED Course  Procedures (including critical care time) DIAGNOSTIC STUDIES: Oxygen Saturation is 100% on RA, normal by my interpretation.    COORDINATION OF CARE: 10:45 PM- Patient informed of current plan for treatment and evaluation and agrees with plan at this time.  MDM   Final diagnoses:  None   Plan Tylenol for pain prescription Medrol Dosepak. Followup with primary care doctor this week  Dx  Lupus exacerbation     Doug SouSam Isobella Ascher, MD 03/08/14 [id number]

## 2014-03-22 ENCOUNTER — Inpatient Hospital Stay (HOSPITAL_COMMUNITY)
Admission: AD | Admit: 2014-03-22 | Discharge: 2014-03-23 | Disposition: A | Discharge status: Home / Self Care | Payer: Medicaid Other | Source: Ambulatory Visit | Attending: Obstetrics & Gynecology | Admitting: Obstetrics & Gynecology

## 2014-03-22 ENCOUNTER — Encounter (HOSPITAL_COMMUNITY): Payer: Self-pay | Admitting: *Deleted

## 2014-03-22 DIAGNOSIS — R109 Unspecified abdominal pain: Secondary | ICD-10-CM | POA: Diagnosis present

## 2014-03-22 DIAGNOSIS — N898 Other specified noninflammatory disorders of vagina: Secondary | ICD-10-CM | POA: Diagnosis not present

## 2014-03-22 LAB — URINALYSIS, ROUTINE W REFLEX MICROSCOPIC
Bilirubin Urine: NEGATIVE
Glucose, UA: NEGATIVE mg/dL
Hgb urine dipstick: NEGATIVE
Ketones, ur: NEGATIVE mg/dL
LEUKOCYTES UA: NEGATIVE
Nitrite: NEGATIVE
PROTEIN: NEGATIVE mg/dL
SPECIFIC GRAVITY, URINE: 1.02 (ref 1.005–1.030)
UROBILINOGEN UA: 1 mg/dL (ref 0.0–1.0)
pH: 6 (ref 5.0–8.0)

## 2014-03-22 LAB — POCT PREGNANCY, URINE: PREG TEST UR: NEGATIVE

## 2014-03-22 NOTE — MAU Note (Signed)
Pt reports lower abd pain and lower back pain off/on for 3 months but now she has burning with urination and vaginal itching.

## 2014-03-22 NOTE — MAU Provider Note (Signed)
History     CSN: [id number]  Arrival date and time: 03/22/14 2222   First Provider Initiated Contact with Patient 03/22/14 2358      Chief Complaint  Patient presents with  . Abdominal Pain  . Back Pain  . Vaginal Itching   HPI  Hayley Webb is a 28 y.o. G2P1011 who presents today with lower abdominal pain x 3 months. She states that she has also developed with discharge and burning for about 2 weeks. She called her PCP, and they would not see her due to her insurance, and she was told to come here for evaluation.   Past Medical History  Diagnosis Date  . 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 HSV infection   . Asthma   . Lupus     dx age 77  . Headache(784.0)   . Infection     UTI  . Anxiety     doing good now    Past Surgical History  Procedure Laterality Date  . Tonsillectomy and adenoidectomy  1998  . Mouth surgery      2 teeth removed- 1 wisdom and 1 in front of it  . Wisdom tooth extraction      Family History  Problem Relation Age of Onset  . Diabetes Maternal Aunt   . Cancer Maternal Grandmother 72    COLON CA  . Diabetes Maternal Grandmother   . Hypertension Maternal Grandfather   . Cancer Maternal Grandfather     prostate  . Asthma Mother   . Asthma Brother   . Anesthesia problems Neg Hx   . Hypotension Neg Hx   . Malignant hyperthermia Neg Hx   . Pseudochol deficiency Neg Hx   . Cancer Paternal Grandmother     breast    History  Substance Use Topics  . Smoking status: Never Smoker   . Smokeless tobacco: Never Used  . Alcohol Use: No     Comment: not with preg    Allergies:  Allergies  Allergen Reactions  . Bactrim Anaphylaxis and Hives  . Prednisone Anaphylaxis and Hives    Has been on Dexamethasone without issue.  . Other Itching    Shrimp:throat itching "can eat other shellfish"  . Pineapple Itching  . Procardia [Nifedipine] Swelling    Swelling of tongue  . Vicodin [Hydrocodone-Acetaminophen] Hives  and Swelling    Can take Percocet without difficulty    Prescriptions prior to admission  Medication Sig Dispense Refill  . albuterol (PROVENTIL HFA;VENTOLIN HFA) 108 (90 BASE) MCG/ACT inhaler Inhale 1-2 puffs into the lungs every 6 (six) hours as needed for wheezing or shortness of breath.      . Hydroxychloroquine Sulfate (PLAQUENIL PO) Take 100 mg by mouth daily.      . methylPREDNISolone (MEDROL DOSEPAK) 4 MG tablet Take as directed on packet #21 tablets  21 tablet  0  . Prenatal Vit-Fe Fumarate-FA (PRENATAL MULTIVITAMIN) TABS Take 1 tablet by mouth daily.      Marland Kitchen oxyCODONE-acetaminophen (PERCOCET/ROXICET) 5-325 MG per tablet Take 1 tablet by mouth every 4 (four) hours as needed for severe pain.  30 tablet  0    ROS Physical Exam   Blood pressure 140/96, pulse 86, temperature 98.3 F (36.8 C), temperature source Oral, resp. rate 18, height 5' 8.5" (1.74 m), weight 135.626 kg (299 lb), last menstrual period 02/13/2014, SpO2 100.00%, unknown if currently breastfeeding.  Physical Exam  Nursing note and vitals reviewed. Constitutional: She is oriented to  person, place, and time. She appears well-developed and well-nourished. No distress.  Cardiovascular: Normal rate.   Respiratory: Effort normal.  GI: Soft. There is no tenderness. There is no rebound.  Genitourinary:   External: no lesion Vagina: small amount of white discharge Cervix: pink, smooth, no CMT Uterus: NSSC Adnexa: NT   Neurological: She is alert and oriented to person, place, and time.  Skin: Skin is warm and dry.  Psychiatric: She has a normal mood and affect.    MAU Course  Procedures  Results for orders placed during the hospital encounter of 03/22/14 (from the past 24 hour(s))  URINALYSIS, ROUTINE W REFLEX MICROSCOPIC     Status: None   Collection Time    03/22/14 10:35 PM      Result Value Ref Range   Color, Urine YELLOW  YELLOW   APPearance CLEAR  CLEAR   Specific Gravity, Urine 1.020  1.005 - 1.030    pH 6.0  5.0 - 8.0   Glucose, UA NEGATIVE  NEGATIVE mg/dL   Hgb urine dipstick NEGATIVE  NEGATIVE   Bilirubin Urine NEGATIVE  NEGATIVE   Ketones, ur NEGATIVE  NEGATIVE mg/dL   Protein, ur NEGATIVE  NEGATIVE mg/dL   Urobilinogen, UA 1.0  0.0 - 1.0 mg/dL   Nitrite NEGATIVE  NEGATIVE   Leukocytes, UA NEGATIVE  NEGATIVE  POCT PREGNANCY, URINE     Status: None   Collection Time    03/22/14 10:43 PM      Result Value Ref Range   Preg Test, Ur NEGATIVE  NEGATIVE  WET PREP, GENITAL     Status: Abnormal   Collection Time    03/23/14 12:25 AM      Result Value Ref Range   Yeast Wet Prep HPF POC NONE SEEN  NONE SEEN   Trich, Wet Prep NONE SEEN  NONE SEEN   Clue Cells Wet Prep HPF POC NONE SEEN  NONE SEEN   WBC, Wet Prep HPF POC FEW (*) NONE SEEN    Assessment and Plan   1. Vaginal discharge    Return to MAU as needed  Follow-up Information   Schedule an appointment as soon as possible for a visit with [facility].   Specialty:  Obstetrics and Gynecology   Contact information:   [address] [place] [address] [phone]       [name] 03/22/2014, 11:59 PM

## 2014-03-23 DIAGNOSIS — N898 Other specified noninflammatory disorders of vagina: Secondary | ICD-10-CM

## 2014-03-23 LAB — WET PREP, GENITAL
Clue Cells Wet Prep HPF POC: NONE SEEN
Trich, Wet Prep: NONE SEEN
YEAST WET PREP: NONE SEEN

## 2014-03-24 LAB — URINE CULTURE

## 2014-03-24 LAB — GC/CHLAMYDIA PROBE AMP
CT Probe RNA: NEGATIVE
GC Probe RNA: NEGATIVE

## 2014-05-11 ENCOUNTER — Encounter (HOSPITAL_COMMUNITY): Payer: Self-pay | Admitting: *Deleted

## 2014-05-11 ENCOUNTER — Inpatient Hospital Stay (HOSPITAL_COMMUNITY)
Admission: AD | Admit: 2014-05-11 | Discharge: 2014-05-11 | Disposition: A | Discharge status: Home / Self Care | Payer: Medicaid Other | Source: Ambulatory Visit | Attending: Family Medicine | Admitting: Family Medicine

## 2014-05-11 DIAGNOSIS — N760 Acute vaginitis: Secondary | ICD-10-CM | POA: Diagnosis not present

## 2014-05-11 DIAGNOSIS — N898 Other specified noninflammatory disorders of vagina: Secondary | ICD-10-CM | POA: Diagnosis present

## 2014-05-11 DIAGNOSIS — B9689 Other specified bacterial agents as the cause of diseases classified elsewhere: Secondary | ICD-10-CM | POA: Diagnosis not present

## 2014-05-11 DIAGNOSIS — A499 Bacterial infection, unspecified: Secondary | ICD-10-CM

## 2014-05-11 LAB — URINE MICROSCOPIC-ADD ON

## 2014-05-11 LAB — URINALYSIS, ROUTINE W REFLEX MICROSCOPIC
Bilirubin Urine: NEGATIVE
Glucose, UA: NEGATIVE mg/dL
KETONES UR: NEGATIVE mg/dL
Leukocytes, UA: NEGATIVE
Nitrite: NEGATIVE
PROTEIN: NEGATIVE mg/dL
Specific Gravity, Urine: 1.02 (ref 1.005–1.030)
Urobilinogen, UA: 1 mg/dL (ref 0.0–1.0)
pH: 6 (ref 5.0–8.0)

## 2014-05-11 LAB — WET PREP, GENITAL
TRICH WET PREP: NONE SEEN
YEAST WET PREP: NONE SEEN

## 2014-05-11 LAB — POCT PREGNANCY, URINE: Preg Test, Ur: NEGATIVE

## 2014-05-11 MED ORDER — METRONIDAZOLE 500 MG PO TABS: 500.0000 mg | ORAL_TABLET | Freq: Two times a day (BID) | ORAL | Status: DC

## 2014-05-11 NOTE — MAU Provider Note (Signed)
Attestation of Attending Supervision of Advanced Practitioner (PA/[name]/NP): Evaluation and management procedures were performed by the Advanced Practitioner under my supervision and collaboration.  I have reviewed the Advanced Practitioner's note and chart, and I agree with the management and plan.  Jacob Stinson, DO Attending Physician Faculty Practice, [facility]  

## 2014-05-11 NOTE — MAU Provider Note (Signed)
History     CSN: [id number]  Arrival date and time: 05/11/14 1821   First Provider Initiated Contact with Patient 05/11/14 1919      Chief Complaint  Patient presents with  . Dysuria  . Vaginal Discharge   HPI Hayley Webb 28 y.o. [id number] presents to MAU complaining of vaginal discharge with odor x 2 weeks.  Today with wiping she noticed bleeding.  She also has abdominal pain, dysuria.  She denies fever, weakness, chills, sweats, nausea, vomiting.  LMP was 05/01/14.  No contraceptive at present.  Not presently breastfeeding.   OB History   Grav Para Term Preterm Abortions TAB SAB Ect Mult Living   2 1 1  1  1   1       Past Medical History  Diagnosis Date  . 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 HSV infection   . Asthma   . Lupus     dx age 28  . Headache(784.0)   . Infection     UTI  . Anxiety     doing good now    Past Surgical History  Procedure Laterality Date  . Tonsillectomy and adenoidectomy  1998  . Mouth surgery      2 teeth removed- 1 wisdom and 1 in front of it  . Wisdom tooth extraction      Family History  Problem Relation Age of Onset  . Diabetes Maternal Aunt   . Cancer Maternal Grandmother 72    COLON CA  . Diabetes Maternal Grandmother   . Hypertension Maternal Grandfather   . Cancer Maternal Grandfather     prostate  . Asthma Mother   . Asthma Brother   . Anesthesia problems Neg Hx   . Hypotension Neg Hx   . Malignant hyperthermia Neg Hx   . Pseudochol deficiency Neg Hx   . Cancer Paternal Grandmother     breast    History  Substance Use Topics  . Smoking status: Never Smoker   . Smokeless tobacco: Never Used  . Alcohol Use: No     Comment: not with preg    Allergies:  Allergies  Allergen Reactions  . Bactrim Anaphylaxis and Hives  . Prednisone Anaphylaxis and Hives    Has been on Dexamethasone without issue.  . Other Itching    Shrimp:throat itching "can eat other shellfish"  . Pineapple  Itching  . Procardia [Nifedipine] Swelling    Swelling of tongue  . Vicodin [Hydrocodone-Acetaminophen] Hives and Swelling    Can take Percocet without difficulty    Prescriptions prior to admission  Medication Sig Dispense Refill  . albuterol (PROVENTIL HFA;VENTOLIN HFA) 108 (90 BASE) MCG/ACT inhaler Inhale 1-2 puffs into the lungs every 6 (six) hours as needed for wheezing or shortness of breath.      . Hydroxychloroquine Sulfate (PLAQUENIL PO) Take 100 mg by mouth daily.      . methylPREDNISolone (MEDROL DOSEPAK) 4 MG tablet Take as directed on packet #21 tablets  21 tablet  0  . oxyCODONE-acetaminophen (PERCOCET/ROXICET) 5-325 MG per tablet Take 1 tablet by mouth every 4 (four) hours as needed for severe pain.  30 tablet  0  . Prenatal Vit-Fe Fumarate-FA (PRENATAL MULTIVITAMIN) TABS Take 1 tablet by mouth daily.        ROS Pertinent HPI in ROS Physical Exam   Blood pressure 156/95, pulse 80, temperature 98.2 F (36.8 C), temperature source Oral, resp. rate 20, height 5\' 9"  (1.753 m), weight 304  lb (137.893 kg), last menstrual period 05/01/2014, SpO2 100.00%, unknown if currently breastfeeding.  Physical Exam  Constitutional: She is oriented to person, place, and time. She appears well-developed and well-nourished.  HENT:  Head: Normocephalic and atraumatic.  Eyes: EOM are normal.  Neck: Normal range of motion.  Cardiovascular: Normal rate and regular rhythm.  Exam reveals no gallop and no friction rub.   No murmur heard. Respiratory: Breath sounds normal. No respiratory distress.  GI: Soft. Bowel sounds are normal. She exhibits no distension. There is no tenderness.  Genitourinary:  Thin white malodorous homogenous discharge present in vagina. Small amt of blood also seen in vagina.  No CMT, no adnexal mass or tenderness  Musculoskeletal: Normal range of motion.  Neurological: She is alert and oriented to person, place, and time.  Skin: Skin is warm and dry.  Psychiatric:  She has a normal mood and affect.   Results for orders placed during the hospital encounter of 05/11/14 (from the past 24 hour(s))  URINALYSIS, ROUTINE W REFLEX MICROSCOPIC     Status: Abnormal   Collection Time    05/11/14  7:05 PM      Result Value Ref Range   Color, Urine YELLOW  YELLOW   APPearance CLEAR  CLEAR   Specific Gravity, Urine 1.020  1.005 - 1.030   pH 6.0  5.0 - 8.0   Glucose, UA NEGATIVE  NEGATIVE mg/dL   Hgb urine dipstick TRACE (*) NEGATIVE   Bilirubin Urine NEGATIVE  NEGATIVE   Ketones, ur NEGATIVE  NEGATIVE mg/dL   Protein, ur NEGATIVE  NEGATIVE mg/dL   Urobilinogen, UA 1.0  0.0 - 1.0 mg/dL   Nitrite NEGATIVE  NEGATIVE   Leukocytes, UA NEGATIVE  NEGATIVE  URINE MICROSCOPIC-ADD ON     Status: Abnormal   Collection Time    05/11/14  7:05 PM      Result Value Ref Range   Squamous Epithelial / LPF RARE  RARE   WBC, UA 3-6  <3 WBC/hpf   RBC / HPF 3-6  <3 RBC/hpf   Bacteria, UA FEW (*) RARE  POCT PREGNANCY, URINE     Status: None   Collection Time    05/11/14  7:12 PM      Result Value Ref Range   Preg Test, Ur NEGATIVE  NEGATIVE  WET PREP, GENITAL     Status: Abnormal   Collection Time    05/11/14  7:49 PM      Result Value Ref Range   Yeast Wet Prep HPF POC NONE SEEN  NONE SEEN   Trich, Wet Prep NONE SEEN  NONE SEEN   Clue Cells Wet Prep HPF POC FEW (*) NONE SEEN   WBC, Wet Prep HPF POC FEW (*) NONE SEEN    MAU Course  Procedures  MDM Exam and wet prep consistent with BV  Assessment and Plan  A: 1. Bacterial vaginosis    P: Discharge to home Flagyl x 1 week No sex/alcohol x  10 days Follow up with PCP for HTN management Return to MAU for emergency  Bertram Denvereague Clark, Karen E 05/11/2014, 7:21 PM

## 2014-05-11 NOTE — MAU Note (Signed)
Patient states she has had a milky vaginal discharge with an odor for 2 weeks. Today states she started having pain with urinating and had blood on a pad and tissue with wiping. Not sure if in urine or vaginal. Denies N/V/D.

## 2014-05-11 NOTE — Discharge Instructions (Signed)
Bacterial Vaginosis Bacterial vaginosis is a vaginal infection that occurs when the normal balance of bacteria in the vagina is disrupted. It results from an overgrowth of certain bacteria. This is the most common vaginal infection in women of childbearing age. Treatment is important to prevent complications, especially in pregnant women, as it can cause a premature delivery. CAUSES  Bacterial vaginosis is caused by an increase in harmful bacteria that are normally present in smaller amounts in the vagina. Several different kinds of bacteria can cause bacterial vaginosis. However, the reason that the condition develops is not fully understood. RISK FACTORS Certain activities or behaviors can put you at an increased risk of developing bacterial vaginosis, including:  Having a new sex partner or multiple sex partners.  Douching.  Using an intrauterine device (IUD) for contraception. Women do not get bacterial vaginosis from toilet seats, bedding, swimming pools, or contact with objects around them. SIGNS AND SYMPTOMS  Some women with bacterial vaginosis have no signs or symptoms. Common symptoms include:  Grey vaginal discharge.  A fishlike odor with discharge, especially after sexual intercourse.  Itching or burning of the vagina and vulva.  Burning or pain with urination. DIAGNOSIS  Your health care provider will take a medical history and examine the vagina for signs of bacterial vaginosis. A sample of vaginal fluid may be taken. Your health care provider will look at this sample under a microscope to check for bacteria and abnormal cells. A vaginal pH test may also be done.  TREATMENT  Bacterial vaginosis may be treated with antibiotic medicines. These may be given in the form of a pill or a vaginal cream. A second round of antibiotics may be prescribed if the condition comes back after treatment.  HOME CARE INSTRUCTIONS   Only take over-the-counter or prescription medicines as  directed by your health care provider.  If antibiotic medicine was prescribed, take it as directed. Make sure you finish it even if you start to feel better.  Do not have sex until treatment is completed.  Tell all sexual partners that you have a vaginal infection. They should see their health care provider and be treated if they have problems, such as a mild rash or itching.  Practice safe sex by using condoms and only having one sex partner. SEEK MEDICAL CARE IF:   Your symptoms are not improving after 3 days of treatment.  You have increased discharge or pain.  You have a fever. MAKE SURE YOU:   Understand these instructions.  Will watch your condition.  Will get help right away if you are not doing well or get worse. FOR MORE INFORMATION  Centers for Disease Control and Prevention, Division of STD Prevention: www.cdc.gov/std American Sexual Health Association (ASHA): www.ashastd.org  Document Released: 07/10/2005 Document Revised: 04/30/2013 Document Reviewed: 02/19/2013 Exit[employer] Patient Information 2015 [employer]. This information is not intended to replace advice given to you by your health care provider. Make sure you discuss any questions you have with your health care provider.  

## 2014-05-12 LAB — GC/CHLAMYDIA PROBE AMP
CT PROBE, AMP APTIMA: NEGATIVE
GC Probe RNA: NEGATIVE

## 2014-05-12 LAB — RPR

## 2014-05-12 LAB — HIV ANTIBODY (ROUTINE TESTING W REFLEX): HIV 1&2 Ab, 4th Generation: NONREACTIVE

## 2014-05-25 ENCOUNTER — Encounter (HOSPITAL_COMMUNITY): Payer: Self-pay | Admitting: *Deleted

## 2014-09-01 ENCOUNTER — Inpatient Hospital Stay (HOSPITAL_COMMUNITY)
Admission: AD | Admit: 2014-09-01 | Discharge: 2014-09-01 | Disposition: A | Discharge status: Home / Self Care | Payer: Medicaid Other | Source: Ambulatory Visit | Attending: Family Medicine | Admitting: Family Medicine

## 2014-09-01 ENCOUNTER — Inpatient Hospital Stay (HOSPITAL_COMMUNITY): Payer: Medicaid Other

## 2014-09-01 ENCOUNTER — Encounter (HOSPITAL_COMMUNITY): Payer: Self-pay

## 2014-09-01 DIAGNOSIS — R1032 Left lower quadrant pain: Secondary | ICD-10-CM | POA: Insufficient documentation

## 2014-09-01 DIAGNOSIS — N898 Other specified noninflammatory disorders of vagina: Secondary | ICD-10-CM | POA: Diagnosis not present

## 2014-09-01 LAB — WET PREP, GENITAL
Trich, Wet Prep: NONE SEEN
Yeast Wet Prep HPF POC: NONE SEEN

## 2014-09-01 LAB — URINE MICROSCOPIC-ADD ON

## 2014-09-01 LAB — POCT PREGNANCY, URINE: Preg Test, Ur: POSITIVE — AB

## 2014-09-01 LAB — CBC
HCT: 38.6 % (ref 36.0–46.0)
Hemoglobin: 12.9 g/dL (ref 12.0–15.0)
MCH: 28.6 pg (ref 26.0–34.0)
MCHC: 33.4 g/dL (ref 30.0–36.0)
MCV: 85.6 fL (ref 78.0–100.0)
PLATELETS: 340 10*3/uL (ref 150–400)
RBC: 4.51 MIL/uL (ref 3.87–5.11)
RDW: 13.8 % (ref 11.5–15.5)
WBC: 6.2 10*3/uL (ref 4.0–10.5)

## 2014-09-01 LAB — URINALYSIS, ROUTINE W REFLEX MICROSCOPIC
Bilirubin Urine: NEGATIVE
GLUCOSE, UA: NEGATIVE mg/dL
Ketones, ur: NEGATIVE mg/dL
Leukocytes, UA: NEGATIVE
NITRITE: NEGATIVE
PH: 6 (ref 5.0–8.0)
PROTEIN: NEGATIVE mg/dL
Specific Gravity, Urine: >1.03 — ABNORMAL HIGH (ref 1.005–1.030)
UROBILINOGEN UA: 0.2 mg/dL (ref 0.0–1.0)

## 2014-09-01 LAB — HCG, QUANTITATIVE, PREGNANCY

## 2014-09-01 LAB — ABO/RH: ABO/RH(D): O POS

## 2014-09-01 NOTE — MAU Note (Signed)
Vaginal discharge; x 2 days; clear with foul odor. LLQ pain x2-3 days; cramping. Denies vaginal bleeding. Denies urinary complaints. LMP 08/17/14.

## 2014-09-01 NOTE — Discharge Instructions (Signed)
Etonogestrel implant What is this medicine? ETONOGESTREL (et oh noe JES trel) is a contraceptive (birth control) device. It is used to prevent pregnancy. It can be used for up to 3 years. This medicine may be used for other purposes; ask your health care provider or pharmacist if you have questions. COMMON BRAND NAME(S): Implanon, Nexplanon What should I tell my health care provider before I take this medicine? They need to know if you have any of these conditions: -abnormal vaginal bleeding -blood vessel disease or blood clots -cancer of the breast, cervix, or liver -depression -diabetes -gallbladder disease -headaches -heart disease or recent heart attack -high blood pressure -high cholesterol -kidney disease -liver disease -renal disease -seizures -tobacco smoker -an unusual or allergic reaction to etonogestrel, other hormones, anesthetics or antiseptics, medicines, foods, dyes, or preservatives -pregnant or trying to get pregnant -breast-feeding How should I use this medicine? This device is inserted just under the skin on the inner side of your upper arm by a health care professional. Talk to your pediatrician regarding the use of this medicine in children. Special care may be needed. Overdosage: If you think you've taken too much of this medicine contact a poison control center or emergency room at once. Overdosage: If you think you have taken too much of this medicine contact a poison control center or emergency room at once. NOTE: This medicine is only for you. Do not share this medicine with others. What if I miss a dose? This does not apply. What may interact with this medicine? Do not take this medicine with any of the following medications: -amprenavir -bosentan -fosamprenavir This medicine may also interact with the following medications: -barbiturate medicines for inducing sleep or treating seizures -certain medicines for fungal infections like ketoconazole and  itraconazole -griseofulvin -medicines to treat seizures like carbamazepine, felbamate, oxcarbazepine, phenytoin, topiramate -modafinil -phenylbutazone -rifampin -some medicines to treat HIV infection like atazanavir, indinavir, lopinavir, nelfinavir, tipranavir, ritonavir -St. John's wort This list may not describe all possible interactions. Give your health care provider a list of all the medicines, herbs, non-prescription drugs, or dietary supplements you use. Also tell them if you smoke, drink alcohol, or use illegal drugs. Some items may interact with your medicine. What should I watch for while using this medicine? This product does not protect you against HIV infection (AIDS) or other sexually transmitted diseases. You should be able to feel the implant by pressing your fingertips over the skin where it was inserted. Tell your doctor if you cannot feel the implant. What side effects may I notice from receiving this medicine? Side effects that you should report to your doctor or health care professional as soon as possible: -allergic reactions like skin rash, itching or hives, swelling of the face, lips, or tongue -breast lumps -changes in vision -confusion, trouble speaking or understanding -dark urine -depressed mood -general ill feeling or flu-like symptoms -light-colored stools -loss of appetite, nausea -right upper belly pain -severe headaches -severe pain, swelling, or tenderness in the abdomen -shortness of breath, chest pain, swelling in a leg -signs of pregnancy -sudden numbness or weakness of the face, arm or leg -trouble walking, dizziness, loss of balance or coordination -unusual vaginal bleeding, discharge -unusually weak or tired -yellowing of the eyes or skin Side effects that usually do not require medical attention (Report these to your doctor or health care professional if they continue or are bothersome.): -acne -breast pain -changes in  weight -cough -fever or chills -headache -irregular menstrual bleeding -itching, burning, and   vaginal discharge -pain or difficulty passing urine -sore throat This list may not describe all possible side effects. Call your doctor for medical advice about side effects. You may report side effects to FDA at 1-800-FDA-1088. Where should I keep my medicine? This drug is given in a hospital or clinic and will not be stored at home. NOTE: This sheet is a summary. It may not cover all possible information. If you have questions about this medicine, talk to your doctor, pharmacist, or health care provider.  2015, Elsevier/Gold Standard. (2012-01-15 15:37:45) Levonorgestrel intrauterine device (IUD) What is this medicine? LEVONORGESTREL IUD (LEE voe nor jes trel) is a contraceptive (birth control) device. The device is placed inside the uterus by a healthcare professional. It is used to prevent pregnancy and can also be used to treat heavy bleeding that occurs during your period. Depending on the device, it can be used for 3 to 5 years. This medicine may be used for other purposes; ask your health care provider or pharmacist if you have questions. COMMON BRAND NAME(S): LILETTA, Mirena, Skyla What should I tell my health care provider before I take this medicine? They need to know if you have any of these conditions: -abnormal Pap smear -cancer of the breast, uterus, or cervix -diabetes -endometritis -genital or pelvic infection now or in the past -have more than one sexual partner or your partner has more than one partner -heart disease -history of an ectopic or tubal pregnancy -immune system problems -IUD in place -liver disease or tumor -problems with blood clots or take blood-thinners -use intravenous drugs -uterus of unusual shape -vaginal bleeding that has not been explained -an unusual or allergic reaction to levonorgestrel, other hormones, silicone, or polyethylene, medicines,  foods, dyes, or preservatives -pregnant or trying to get pregnant -breast-feeding How should I use this medicine? This device is placed inside the uterus by a health care professional. Talk to your pediatrician regarding the use of this medicine in children. Special care may be needed. Overdosage: If you think you have taken too much of this medicine contact a poison control center or emergency room at once. NOTE: This medicine is only for you. Do not share this medicine with others. What if I miss a dose? This does not apply. What may interact with this medicine? Do not take this medicine with any of the following medications: -amprenavir -bosentan -fosamprenavir This medicine may also interact with the following medications: -aprepitant -barbiturate medicines for inducing sleep or treating seizures -bexarotene -griseofulvin -medicines to treat seizures like carbamazepine, ethotoin, felbamate, oxcarbazepine, phenytoin, topiramate -modafinil -pioglitazone -rifabutin -rifampin -rifapentine -some medicines to treat HIV infection like atazanavir, indinavir, lopinavir, nelfinavir, tipranavir, ritonavir -St. John's wort -warfarin This list may not describe all possible interactions. Give your health care provider a list of all the medicines, herbs, non-prescription drugs, or dietary supplements you use. Also tell them if you smoke, drink alcohol, or use illegal drugs. Some items may interact with your medicine. What should I watch for while using this medicine? Visit your doctor or health care professional for regular check ups. See your doctor if you or your partner has sexual contact with others, becomes HIV positive, or gets a sexual transmitted disease. This product does not protect you against HIV infection (AIDS) or other sexually transmitted diseases. You can check the placement of the IUD yourself by reaching up to the top of your vagina with clean fingers to feel the threads. Do  not pull on the threads. It is a good   habit to check placement after each menstrual period. Call your doctor right away if you feel more of the IUD than just the threads or if you cannot feel the threads at all. The IUD may come out by itself. You may become pregnant if the device comes out. If you notice that the IUD has come out use a backup birth control method like condoms and call your health care provider. Using tampons will not change the position of the IUD and are okay to use during your period. What side effects may I notice from receiving this medicine? Side effects that you should report to your doctor or health care professional as soon as possible: -allergic reactions like skin rash, itching or hives, swelling of the face, lips, or tongue -fever, flu-like symptoms -genital sores -high blood pressure -no menstrual period for 6 weeks during use -pain, swelling, warmth in the leg -pelvic pain or tenderness -severe or sudden headache -signs of pregnancy -stomach cramping -sudden shortness of breath -trouble with balance, talking, or walking -unusual vaginal bleeding, discharge -yellowing of the eyes or skin Side effects that usually do not require medical attention (report to your doctor or health care professional if they continue or are bothersome): -acne -breast pain -change in sex drive or performance -changes in weight -cramping, dizziness, or faintness while the device is being inserted -headache -irregular menstrual bleeding within first 3 to 6 months of use -nausea This list may not describe all possible side effects. Call your doctor for medical advice about side effects. You may report side effects to FDA at 1-800-FDA-1088. Where should I keep my medicine? This does not apply. NOTE: This sheet is a summary. It may not cover all possible information. If you have questions about this medicine, talk to your doctor, pharmacist, or health care provider.  2015,  Elsevier/Gold Standard. (2011-08-10 13:54:04)  

## 2014-09-01 NOTE — MAU Provider Note (Signed)
History     CSN: [id number]  Arrival date and time: 09/01/14 1925   None     Chief Complaint  Patient presents with  . Abdominal Pain   HPI  Hayley Webb is a 29 y.o. G3P1011 at [id number] hwo presents today with LLQ pain and back pain for a couple of days. She denies any vaginal bleeding . She states her LMP was 08/17/14 and she states that was a normal period.   Past Medical History  Diagnosis Date  . 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 HSV infection   . Asthma   . Lupus     dx age 89  . Headache(784.0)   . Infection     UTI  . Anxiety     doing good now    Past Surgical History  Procedure Laterality Date  . Tonsillectomy and adenoidectomy  1998  . Mouth surgery      2 teeth removed- 1 wisdom and 1 in front of it  . Wisdom tooth extraction      Family History  Problem Relation Age of Onset  . Diabetes Maternal Aunt   . Cancer Maternal Grandmother 72    COLON CA  . Diabetes Maternal Grandmother   . Hypertension Maternal Grandfather   . Cancer Maternal Grandfather     prostate  . Asthma Mother   . Asthma Brother   . Anesthesia problems Neg Hx   . Hypotension Neg Hx   . Malignant hyperthermia Neg Hx   . Pseudochol deficiency Neg Hx   . Cancer Paternal Grandmother     breast    History  Substance Use Topics  . Smoking status: Never Smoker   . Smokeless tobacco: Never Used  . Alcohol Use: No     Comment: not with preg    Allergies:  Allergies  Allergen Reactions  . Bactrim Anaphylaxis and Hives  . Prednisone Anaphylaxis and Hives    Has been on Dexamethasone without issue.  . Other Itching    Shrimp:throat itching "can eat other shellfish"  . Pineapple Itching  . Procardia [Nifedipine] Swelling    Swelling of tongue  . Vicodin [Hydrocodone-Acetaminophen] Hives and Swelling    Can take Percocet without difficulty    Prescriptions prior to admission  Medication Sig Dispense Refill Last Dose  . albuterol (PROVENTIL  HFA;VENTOLIN HFA) 108 (90 BASE) MCG/ACT inhaler Inhale 1-2 puffs into the lungs every 6 (six) hours as needed for wheezing or shortness of breath.   Past Month at Unknown time  . aspirin-acetaminophen-caffeine (EXCEDRIN MIGRAINE) 250-250-65 MG per tablet Take 2 tablets by mouth every 6 (six) hours as needed for headache or migraine.   Past Week at Unknown time  . Hydroxychloroquine Sulfate (PLAQUENIL PO) Take 100 mg by mouth daily.   05/11/2014 at 0800  . metroNIDAZOLE (FLAGYL) 500 MG tablet Take 1 tablet (500 mg total) by mouth 2 (two) times daily. 14 tablet 0   . traMADol (ULTRAM) 50 MG tablet Take 50-150 mg by mouth daily as needed for severe pain.   Past Week at Unknown time    ROS Physical Exam   Blood pressure 153/97, pulse 77, temperature 97.9 F (36.6 C), temperature source Oral, resp. rate 20, weight 142.089 kg (313 lb 4 oz), last menstrual period 08/17/2014, SpO2 100 %, not currently breastfeeding.  Physical Exam  Nursing note and vitals reviewed. Constitutional: She is oriented to person, place, and time. She appears well-developed and well-nourished. No distress.  Cardiovascular: Normal rate.   Respiratory: Effort normal.  GI: Soft. There is no tenderness. There is no rebound.  Neurological: She is alert and oriented to person, place, and time.  Skin: Skin is warm and dry.  Psychiatric: She has a normal mood and affect.    MAU Course  Procedures  Results for orders placed or performed during the hospital encounter of 09/01/14 (from the past 24 hour(s))  Urinalysis, Routine w reflex microscopic     Status: Abnormal   Collection Time: 09/01/14  7:42 PM  Result Value Ref Range   Color, Urine YELLOW YELLOW   APPearance CLEAR CLEAR   Specific Gravity, Urine >1.030 (H) 1.005 - 1.030   pH 6.0 5.0 - 8.0   Glucose, UA NEGATIVE NEGATIVE mg/dL   Hgb urine dipstick TRACE (A) NEGATIVE   Bilirubin Urine NEGATIVE NEGATIVE   Ketones, ur NEGATIVE NEGATIVE mg/dL   Protein, ur  NEGATIVE NEGATIVE mg/dL   Urobilinogen, UA 0.2 0.0 - 1.0 mg/dL   Nitrite NEGATIVE NEGATIVE   Leukocytes, UA NEGATIVE NEGATIVE  Urine microscopic-add on     Status: Abnormal   Collection Time: 09/01/14  7:42 PM  Result Value Ref Range   Squamous Epithelial / LPF RARE RARE   RBC / HPF 0-2 <3 RBC/hpf   Bacteria, UA FEW (A) RARE   Urine-Other MUCOUS PRESENT   Pregnancy, urine POC     Status: Abnormal   Collection Time: 09/01/14  7:50 PM  Result Value Ref Range   Preg Test, Ur POSITIVE (A) NEGATIVE  CBC     Status: None   Collection Time: 09/01/14  8:22 PM  Result Value Ref Range   WBC 6.2 4.0 - 10.5 K/uL   RBC 4.51 3.87 - 5.11 MIL/uL   Hemoglobin 12.9 12.0 - 15.0 g/dL   HCT 19.1 47.8 - 29.5 %   MCV 85.6 78.0 - 100.0 fL   MCH 28.6 26.0 - 34.0 pg   MCHC 33.4 30.0 - 36.0 g/dL   RDW 62.1 30.8 - 65.7 %   Platelets 340 150 - 400 K/uL  ABO/Rh     Status: None   Collection Time: 09/01/14  8:22 PM  Result Value Ref Range   ABO/RH(D) O POS   hCG, quantitative, pregnancy     Status: None   Collection Time: 09/01/14  8:22 PM  Result Value Ref Range   hCG, Beta Chain, Quant, S <1 <5 mIU/mL  Wet prep, genital     Status: Abnormal   Collection Time: 09/01/14 10:55 PM  Result Value Ref Range   Yeast Wet Prep HPF POC NONE SEEN NONE SEEN   Trich, Wet Prep NONE SEEN NONE SEEN   Clue Cells Wet Prep HPF POC FEW (A) NONE SEEN   WBC, Wet Prep HPF POC FEW (A) NONE SEEN   DW the patient re: false positive UPT. HCG was negative. Reassured patient that she is not pregnant. D/W the patient at length about contraception choices. She plans to call her PCP ASAP to start something for contraception.   Assessment and Plan   1. Vaginal discharge    Contraception options discussed at length Patient given information on Mirena and Nexplanon   Follow-up Information    Follow up with [facility] [place].   Why:  As needed   Contact information:   [address] [facility] [place]  [address] [phone]     '  Hayley Webb 09/01/2014, 8:38 PM

## 2014-09-01 NOTE — MAU Note (Signed)
Pt states she has been having abdominal pain that started 2 days ago

## 2014-09-02 LAB — GC/CHLAMYDIA PROBE AMP (~~LOC~~) NOT AT ARMC
CHLAMYDIA, DNA PROBE: NEGATIVE
NEISSERIA GONORRHEA: NEGATIVE

## 2014-09-03 LAB — HIV ANTIBODY (ROUTINE TESTING W REFLEX): HIV SCREEN 4TH GENERATION: NONREACTIVE

## 2014-09-03 LAB — POCT PREGNANCY, URINE: Preg Test, Ur: NEGATIVE

## 2014-09-17 ENCOUNTER — Encounter (HOSPITAL_COMMUNITY): Payer: Self-pay

## 2014-09-17 ENCOUNTER — Emergency Department (HOSPITAL_COMMUNITY)
Admission: EM | Admit: 2014-09-17 | Discharge: 2014-09-17 | Disposition: A | Discharge status: Home / Self Care | Payer: Medicaid Other | Attending: Emergency Medicine | Admitting: Emergency Medicine

## 2014-09-17 DIAGNOSIS — Z79899 Other long term (current) drug therapy: Secondary | ICD-10-CM | POA: Diagnosis not present

## 2014-09-17 DIAGNOSIS — Z3202 Encounter for pregnancy test, result negative: Secondary | ICD-10-CM | POA: Diagnosis not present

## 2014-09-17 DIAGNOSIS — J45909 Unspecified asthma, uncomplicated: Secondary | ICD-10-CM | POA: Insufficient documentation

## 2014-09-17 DIAGNOSIS — Z8659 Personal history of other mental and behavioral disorders: Secondary | ICD-10-CM | POA: Diagnosis not present

## 2014-09-17 DIAGNOSIS — Z792 Long term (current) use of antibiotics: Secondary | ICD-10-CM | POA: Diagnosis not present

## 2014-09-17 DIAGNOSIS — M329 Systemic lupus erythematosus, unspecified: Secondary | ICD-10-CM | POA: Diagnosis present

## 2014-09-17 DIAGNOSIS — N898 Other specified noninflammatory disorders of vagina: Secondary | ICD-10-CM | POA: Diagnosis not present

## 2014-09-17 DIAGNOSIS — Z8619 Personal history of other infectious and parasitic diseases: Secondary | ICD-10-CM | POA: Diagnosis not present

## 2014-09-17 DIAGNOSIS — N390 Urinary tract infection, site not specified: Secondary | ICD-10-CM

## 2014-09-17 LAB — CBC WITH DIFFERENTIAL/PLATELET
BASOS PCT: 0 % (ref 0–1)
Basophils Absolute: 0 10*3/uL (ref 0.0–0.1)
Eosinophils Absolute: 0.2 10*3/uL (ref 0.0–0.7)
Eosinophils Relative: 3 % (ref 0–5)
HCT: 38.9 % (ref 36.0–46.0)
HEMOGLOBIN: 12.4 g/dL (ref 12.0–15.0)
LYMPHS ABS: 3 10*3/uL (ref 0.7–4.0)
Lymphocytes Relative: 45 % (ref 12–46)
MCH: 27.6 pg (ref 26.0–34.0)
MCHC: 31.9 g/dL (ref 30.0–36.0)
MCV: 86.4 fL (ref 78.0–100.0)
Monocytes Absolute: 0.4 10*3/uL (ref 0.1–1.0)
Monocytes Relative: 7 % (ref 3–12)
NEUTROS PCT: 45 % (ref 43–77)
Neutro Abs: 3 10*3/uL (ref 1.7–7.7)
PLATELETS: 354 10*3/uL (ref 150–400)
RBC: 4.5 MIL/uL (ref 3.87–5.11)
RDW: 14 % (ref 11.5–15.5)
WBC: 6.6 10*3/uL (ref 4.0–10.5)

## 2014-09-17 LAB — WET PREP, GENITAL
CLUE CELLS WET PREP: NONE SEEN
Trich, Wet Prep: NONE SEEN
YEAST WET PREP: NONE SEEN

## 2014-09-17 LAB — URINALYSIS, ROUTINE W REFLEX MICROSCOPIC
Bilirubin Urine: NEGATIVE
Glucose, UA: NEGATIVE mg/dL
Hgb urine dipstick: NEGATIVE
Ketones, ur: NEGATIVE mg/dL
Leukocytes, UA: NEGATIVE
Nitrite: NEGATIVE
Protein, ur: NEGATIVE mg/dL
Specific Gravity, Urine: 1.036 — ABNORMAL HIGH (ref 1.005–1.030)
Urobilinogen, UA: 1 mg/dL (ref 0.0–1.0)
pH: 6.5 (ref 5.0–8.0)

## 2014-09-17 LAB — POC URINE PREG, ED: PREG TEST UR: NEGATIVE

## 2014-09-17 LAB — I-STAT CHEM 8, ED
BUN: 16 mg/dL (ref 6–23)
CALCIUM ION: 1.13 mmol/L (ref 1.12–1.23)
Chloride: 105 mmol/L (ref 96–112)
Creatinine, Ser: 0.7 mg/dL (ref 0.50–1.10)
Glucose, Bld: 86 mg/dL (ref 70–99)
HEMATOCRIT: 39 % (ref 36.0–46.0)
HEMOGLOBIN: 13.3 g/dL (ref 12.0–15.0)
POTASSIUM: 3.6 mmol/L (ref 3.5–5.1)
Sodium: 140 mmol/L (ref 135–145)
TCO2: 20 mmol/L (ref 0–100)

## 2014-09-17 MED ORDER — DOXYCYCLINE HYCLATE 100 MG PO CAPS: 100.0000 mg | ORAL_CAPSULE | Freq: Two times a day (BID) | ORAL | Status: DC

## 2014-09-17 MED ORDER — OXYCODONE-ACETAMINOPHEN 5-325 MG PO TABS: 1.0000 | ORAL_TABLET | Freq: Once | ORAL | Status: DC

## 2014-09-17 MED ORDER — CEPHALEXIN 500 MG PO CAPS: 500.0000 mg | ORAL_CAPSULE | Freq: Four times a day (QID) | ORAL | Status: DC

## 2014-09-17 MED ORDER — OXYCODONE-ACETAMINOPHEN 5-325 MG PO TABS: 1.0000 | ORAL_TABLET | Freq: Four times a day (QID) | ORAL | Status: DC | PRN

## 2014-09-17 MED ORDER — HYDROCODONE-ACETAMINOPHEN 5-325 MG PO TABS: 1.0000 | ORAL_TABLET | Freq: Four times a day (QID) | ORAL | Status: DC | PRN

## 2014-09-17 MED ORDER — OXYCODONE-ACETAMINOPHEN 5-325 MG PO TABS
1.0000 | ORAL_TABLET | Freq: Once | ORAL | Status: AC
  Administered 2014-09-17: 1 via ORAL
  Filled 2014-09-17: qty 1

## 2014-09-17 MED ORDER — FLUCONAZOLE 150 MG PO TABS: 150.0000 mg | ORAL_TABLET | Freq: Every day | ORAL | Status: AC

## 2014-09-17 NOTE — ED Notes (Signed)
Patient ambulated to restroom unassisted.

## 2014-09-17 NOTE — ED Notes (Addendum)
Patient reports she is having a lupus excacerbation with severe bilateral kidney pain.  Also ncomplains of generalized pain to all extremities.

## 2014-09-17 NOTE — ED Notes (Signed)
Patient states she is here for a lupus flare up. Patient states this symptoms started on Tuesday and worsened on Wednesday night. Patient also c/o bilateral kidney pain. Patient states she feels like she "still needs to go" after she uses the restroom. Patient is alert and oriented, mother at bedside.

## 2014-09-17 NOTE — Discharge Instructions (Signed)
We are sending you home with meds for a Urinary tract infection. Take pain meds as needed.  DOXYCLINE to be taken only if symptoms not getting better, despite taking the antibiotics. DIFLUCAN to be taken only if there is YEAST infection.  ALL OF THESE MEDICATION AFFECTS LACTATION, so we recommend STOPPING THE BREAST FEEDING and not to resume until cleared by your doctors.   Urinary Tract Infection Urinary tract infections (UTIs) can develop anywhere along your urinary tract. Your urinary tract is your body's drainage system for removing wastes and extra water. Your urinary tract includes two kidneys, two ureters, a bladder, and a urethra. Your kidneys are a pair of bean-shaped organs. Each kidney is about the size of your fist. They are located below your ribs, one on each side of your spine. CAUSES Infections are caused by microbes, which are microscopic organisms, including fungi, viruses, and bacteria. These organisms are so small that they can only be seen through a microscope. Bacteria are the microbes that most commonly cause UTIs. SYMPTOMS  Symptoms of UTIs may vary by age and gender of the patient and by the location of the infection. Symptoms in young women typically include a frequent and intense urge to urinate and a painful, burning feeling in the bladder or urethra during urination. Older women and men are more likely to be tired, shaky, and weak and have muscle aches and abdominal pain. A fever may mean the infection is in your kidneys. Other symptoms of a kidney infection include pain in your back or sides below the ribs, nausea, and vomiting. DIAGNOSIS To diagnose a UTI, your caregiver will ask you about your symptoms. Your caregiver also will ask to provide a urine sample. The urine sample will be tested for bacteria and white blood cells. White blood cells are made by your body to help fight infection. TREATMENT  Typically, UTIs can be treated with medication. Because most UTIs  are caused by a bacterial infection, they usually can be treated with the use of antibiotics. The choice of antibiotic and length of treatment depend on your symptoms and the type of bacteria causing your infection. HOME CARE INSTRUCTIONS  If you were prescribed antibiotics, take them exactly as your caregiver instructs you. Finish the medication even if you feel better after you have only taken some of the medication.  Drink enough water and fluids to keep your urine clear or pale yellow.  Avoid caffeine, tea, and carbonated beverages. They tend to irritate your bladder.  Empty your bladder often. Avoid holding urine for long periods of time.  Empty your bladder before and after sexual intercourse.  After a bowel movement, women should cleanse from front to back. Use each tissue only once. SEEK MEDICAL CARE IF:   You have back pain.  You develop a fever.  Your symptoms do not begin to resolve within 3 days. SEEK IMMEDIATE MEDICAL CARE IF:   You have severe back pain or lower abdominal pain.  You develop chills.  You have nausea or vomiting.  You have continued burning or discomfort with urination. MAKE SURE YOU:   Understand these instructions.  Will watch your condition.  Will get help right away if you are not doing well or get worse. Document Released: 04/19/2005 Document Revised: 01/09/2012 Document Reviewed: 08/18/2011 [facility] LPExitCare Patient Information 2015 [place], [place]. This information is not intended to replace advice given to you by your health care provider. Make sure you discuss any questions you have with your health care provider.

## 2014-09-17 NOTE — ED Provider Notes (Signed)
CSN: [id number]     Arrival date & time 09/17/14  0113 History   First MD Initiated Contact with Patient 09/17/14 [phone]     Chief Complaint  Patient presents with  . Lupus     (Consider location/radiation/quality/duration/timing/severity/associated sxs/prior Treatment) HPI Comments: Pt comes in with cc of LUPUS FLARE UP. Pt is on immunosupressive therapy for the SLE. Reports that she started having some back pain, both sides 3 days ago. Pt also has urgency to urinate. In addition, she feels that her couple of joints are hurting more than usual. There is no n/v/f/c. Pt hasnt seen her Rheumatologist since September. Pt has had similar pain symptoms with SLE, and usually she is treated with pain meds.   ROS 10 Systems reviewed and are negative for acute change except as noted in the HPI.     The history is provided by the patient.    Past Medical History  Diagnosis Date  . 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 HSV infection   . Asthma   . Lupus     dx age 78  . Headache(784.0)   . Infection     UTI  . Anxiety     doing good now   Past Surgical History  Procedure Laterality Date  . Tonsillectomy and adenoidectomy  1998  . Mouth surgery      2 teeth removed- 1 wisdom and 1 in front of it  . Wisdom tooth extraction     Family History  Problem Relation Age of Onset  . Diabetes Maternal Aunt   . Cancer Maternal Grandmother 72    COLON CA  . Diabetes Maternal Grandmother   . Hypertension Maternal Grandfather   . Cancer Maternal Grandfather     prostate  . Asthma Mother   . Asthma Brother   . Anesthesia problems Neg Hx   . Hypotension Neg Hx   . Malignant hyperthermia Neg Hx   . Pseudochol deficiency Neg Hx   . Cancer Paternal Grandmother     breast   History  Substance Use Topics  . Smoking status: Never Smoker   . Smokeless tobacco: Never Used  . Alcohol Use: No     Comment: not with preg   OB History    Gravida Para Term Preterm AB  TAB SAB Ectopic Multiple Living   Review of Systems  Genitourinary: Positive for flank pain and pelvic pain.  All other systems reviewed and are negative.     Allergies  Bactrim; Prednisone; Other; Pineapple; Procardia; and Vicodin  Home Medications   Prior to Admission medications   Medication Sig Start Date End Date Taking? Authorizing Provider  albuterol (PROVENTIL HFA;VENTOLIN HFA) 108 (90 BASE) MCG/ACT inhaler Inhale 1-2 puffs into the lungs every 6 (six) hours as needed for wheezing or shortness of breath.   Yes Historical Provider, MD  aspirin-acetaminophen-caffeine (EXCEDRIN MIGRAINE) [phone] MG per tablet Take 2 tablets by mouth every 6 (six) hours as needed for headache or migraine.   Yes Historical Provider, MD  Hydroxychloroquine Sulfate (PLAQUENIL PO) Take 100 mg by mouth daily.   Yes Historical Provider, MD  methylPREDNISolone (MEDROL) 4 MG tablet Take 4 mg by mouth daily.   Yes Historical Provider, MD  cephALEXin (KEFLEX) 500 MG capsule Take 1 capsule (500 mg total) by mouth 4 (four) times daily. 09/17/14   Derwood Kaplan, MD  doxycycline (VIBRAMYCIN) 100  MG capsule Take 1 capsule (100 mg total) by mouth 2 (two) times daily. 09/17/14   Derwood KaplanAnkit Odyssey Vasbinder, MD  fluconazole (DIFLUCAN) 150 MG tablet Take 1 tablet (150 mg total) by mouth daily. 09/17/14 09/24/14  Derwood KaplanAnkit Sakira Dahmer, MD  HYDROcodone-acetaminophen (NORCO/VICODIN) 5-325 MG per tablet Take 1 tablet by mouth every 6 (six) hours as needed. 09/17/14   Derwood KaplanAnkit Ricci Dirocco, MD  metroNIDAZOLE (FLAGYL) 500 MG tablet Take 1 tablet (500 mg total) by mouth 2 (two) times daily. Patient not taking: Reported on 09/17/2014 05/11/14   Bertram DenverKaren E Teague Clark, PA-C  oxyCODONE-acetaminophen (PERCOCET/ROXICET) 5-325 MG per tablet Take 1 tablet by mouth every 6 (six) hours as needed for severe pain. 09/17/14   Connor Meacham Rhunette CroftNanavati, MD   BP 129/90 mmHg  Pulse 83  Temp(Src) 97.8 F (36.6 C) (Oral)  Resp 18  SpO2 97%  LMP 08/17/2014  (Exact Date) Physical Exam  Constitutional: She is oriented to person, place, and time. She appears well-developed.  HENT:  Head: Normocephalic and atraumatic.  Eyes: Conjunctivae and EOM are normal. Pupils are equal, round, and reactive to light.  Neck: Normal range of motion. Neck supple.  Cardiovascular: Normal rate, regular rhythm, normal heart sounds and intact distal pulses.   No murmur heard. Pulmonary/Chest: Effort normal. No respiratory distress. She has no wheezes.  Abdominal: Soft. Bowel sounds are normal. She exhibits no distension. There is tenderness. There is no rebound and no guarding.  Lower quadrant and diffuse lower back tenderness  Genitourinary: Vagina normal and uterus normal.  External exam - normal, no lesions Speculum exam: Pt has some white discharge, no blood Bimanual exam: Patient has DISCOMFORT with cervical manipulation, no adnexal tenderness or fullness and cervical os is closed  Neurological: She is alert and oriented to person, place, and time.  Skin: Skin is warm and dry.  Nursing note and vitals reviewed.   ED Course  Procedures (including critical care time) Labs Review Labs Reviewed  WET PREP, GENITAL - Abnormal; Notable for the following:    WBC, Wet Prep HPF POC FEW (*)    All other components within normal limits  URINALYSIS, ROUTINE W REFLEX MICROSCOPIC - Abnormal; Notable for the following:    Specific Gravity, Urine 1.036 (*)    All other components within normal limits  CBC WITH DIFFERENTIAL/PLATELET  POC URINE PREG, ED  I-STAT CHEM 8, ED  GC/CHLAMYDIA PROBE AMP ([place])    Imaging Review No results found.   EKG Interpretation None      MDM   Final diagnoses:  UTI (lower urinary tract infection)  Lupus   Pt comes in with cc of back pain, has hx of SLE. UA is clean, however, pt has classic UTI like sx, and she is immunocompromised. Will treat as a clinical UTI given that the UA has 85% sensitivity. Pelvic exam - pt  has some cervical discomfort, certainly no cervical motion tenderness - in case keflex doesn't help her, she has been given doxy. Pain meds prescribed. 0 SIRS criteria, no n/v/f/c. Pt has no CVA tenderness, so unlikely to be pyelo, as there are no systemic signs. Return precautions discussed.   Derwood KaplanAnkit Folasade Mooty, MD 09/17/14 [phone]

## 2014-09-18 LAB — GC/CHLAMYDIA PROBE AMP (~~LOC~~) NOT AT ARMC
Chlamydia: NEGATIVE
Neisseria Gonorrhea: NEGATIVE

## 2014-10-12 ENCOUNTER — Other Ambulatory Visit: Payer: Self-pay

## 2014-10-12 ENCOUNTER — Encounter (HOSPITAL_BASED_OUTPATIENT_CLINIC_OR_DEPARTMENT_OTHER): Payer: Self-pay | Admitting: Emergency Medicine

## 2014-10-12 ENCOUNTER — Emergency Department (HOSPITAL_BASED_OUTPATIENT_CLINIC_OR_DEPARTMENT_OTHER)
Admission: EM | Admit: 2014-10-12 | Discharge: 2014-10-13 | Disposition: A | Discharge status: Home / Self Care | Payer: Medicaid Other | Attending: Emergency Medicine | Admitting: Emergency Medicine

## 2014-10-12 ENCOUNTER — Emergency Department (HOSPITAL_BASED_OUTPATIENT_CLINIC_OR_DEPARTMENT_OTHER): Payer: Medicaid Other

## 2014-10-12 DIAGNOSIS — Z8619 Personal history of other infectious and parasitic diseases: Secondary | ICD-10-CM | POA: Diagnosis not present

## 2014-10-12 DIAGNOSIS — Z79899 Other long term (current) drug therapy: Secondary | ICD-10-CM | POA: Diagnosis not present

## 2014-10-12 DIAGNOSIS — Z8659 Personal history of other mental and behavioral disorders: Secondary | ICD-10-CM | POA: Insufficient documentation

## 2014-10-12 DIAGNOSIS — Z792 Long term (current) use of antibiotics: Secondary | ICD-10-CM | POA: Diagnosis not present

## 2014-10-12 DIAGNOSIS — J029 Acute pharyngitis, unspecified: Secondary | ICD-10-CM | POA: Diagnosis present

## 2014-10-12 DIAGNOSIS — J45901 Unspecified asthma with (acute) exacerbation: Secondary | ICD-10-CM

## 2014-10-12 DIAGNOSIS — Z87440 Personal history of urinary (tract) infections: Secondary | ICD-10-CM | POA: Diagnosis not present

## 2014-10-12 LAB — RAPID STREP SCREEN (MED CTR MEBANE ONLY): Streptococcus, Group A Screen (Direct): NEGATIVE

## 2014-10-12 LAB — BASIC METABOLIC PANEL
ANION GAP: 9 (ref 5–15)
BUN: 17 mg/dL (ref 6–23)
CALCIUM: 8.9 mg/dL (ref 8.4–10.5)
CO2: 22 mmol/L (ref 19–32)
CREATININE: 0.68 mg/dL (ref 0.50–1.10)
Chloride: 105 mmol/L (ref 96–112)
Glucose, Bld: 98 mg/dL (ref 70–99)
Potassium: 3.7 mmol/L (ref 3.5–5.1)
SODIUM: 136 mmol/L (ref 135–145)

## 2014-10-12 LAB — CBC WITH DIFFERENTIAL/PLATELET
BASOS ABS: 0 10*3/uL (ref 0.0–0.1)
BASOS PCT: 0 % (ref 0–1)
EOS ABS: 0.4 10*3/uL (ref 0.0–0.7)
EOS PCT: 5 % (ref 0–5)
HEMATOCRIT: 39.4 % (ref 36.0–46.0)
Hemoglobin: 12.8 g/dL (ref 12.0–15.0)
Lymphocytes Relative: 38 % (ref 12–46)
Lymphs Abs: 3.1 10*3/uL (ref 0.7–4.0)
MCH: 27.6 pg (ref 26.0–34.0)
MCHC: 32.5 g/dL (ref 30.0–36.0)
MCV: 84.9 fL (ref 78.0–100.0)
MONO ABS: 0.6 10*3/uL (ref 0.1–1.0)
Monocytes Relative: 7 % (ref 3–12)
Neutro Abs: 4.1 10*3/uL (ref 1.7–7.7)
Neutrophils Relative %: 50 % (ref 43–77)
PLATELETS: 314 10*3/uL (ref 150–400)
RBC: 4.64 MIL/uL (ref 3.87–5.11)
RDW: 14 % (ref 11.5–15.5)
WBC: 8.2 10*3/uL (ref 4.0–10.5)

## 2014-10-12 LAB — TROPONIN I: Troponin I: <0.03 ng/mL (ref <0.031)

## 2014-10-12 MED ORDER — IPRATROPIUM-ALBUTEROL 0.5-2.5 (3) MG/3ML IN SOLN
3.0000 mL | Freq: Once | RESPIRATORY_TRACT | Status: AC
  Administered 2014-10-12: 3 mL via RESPIRATORY_TRACT
  Filled 2014-10-12: qty 3

## 2014-10-12 MED ORDER — IPRATROPIUM BROMIDE 0.02 % IN SOLN: 0.5000 mg | Freq: Once | RESPIRATORY_TRACT | Status: DC

## 2014-10-12 MED ORDER — IBUPROFEN 800 MG PO TABS
800.0000 mg | ORAL_TABLET | Freq: Once | ORAL | Status: AC
  Administered 2014-10-12: 800 mg via ORAL
  Filled 2014-10-12: qty 1

## 2014-10-12 MED ORDER — ALBUTEROL SULFATE (2.5 MG/3ML) 0.083% IN NEBU: 5.0000 mg | INHALATION_SOLUTION | Freq: Once | RESPIRATORY_TRACT | Status: DC

## 2014-10-12 MED ORDER — DEXAMETHASONE SODIUM PHOSPHATE 10 MG/ML IJ SOLN
10.0000 mg | Freq: Once | INTRAMUSCULAR | Status: AC
  Administered 2014-10-12: 10 mg via INTRAMUSCULAR
  Filled 2014-10-12: qty 1

## 2014-10-12 MED ORDER — ALBUTEROL SULFATE (2.5 MG/3ML) 0.083% IN NEBU
2.5000 mg | INHALATION_SOLUTION | Freq: Once | RESPIRATORY_TRACT | Status: AC
  Administered 2014-10-12: 2.5 mg via RESPIRATORY_TRACT
  Filled 2014-10-12: qty 3

## 2014-10-12 NOTE — ED Provider Notes (Signed)
CSN: [id number]     Arrival date & time 10/12/14  2002 History   First MD Initiated Contact with Patient 10/12/14 2138     Chief Complaint  Patient presents with  . URI     (Consider location/radiation/quality/duration/timing/severity/associated sxs/prior Treatment) HPI Hayley Webb is a 29 year old female with past medical history of asthma, lupus presents the ER complaining of chest congestion, chest tightness, dyspnea on exertion, nasal congestion, sore throat. Patient states her symptoms began acutely on Saturday with an associated fever of 102F. She reports her fever, sore throat and nasal congestion have since begun to subside, however she is felt a constant chest tightness, dyspnea on exertion as well as intermittent wheezing since Saturday. She reports using her home albuterol inhaler and home albuterol nebulizer with mild relief.  Past Medical History  Diagnosis Date  . 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 HSV infection   . Asthma   . Lupus     dx age 27  . Headache(784.0)   . Infection     UTI  . Anxiety     doing good now   Past Surgical History  Procedure Laterality Date  . Tonsillectomy and adenoidectomy  1998  . Mouth surgery      2 teeth removed- 1 wisdom and 1 in front of it  . Wisdom tooth extraction     Family History  Problem Relation Age of Onset  . Diabetes Maternal Aunt   . Cancer Maternal Grandmother 72    COLON CA  . Diabetes Maternal Grandmother   . Hypertension Maternal Grandfather   . Cancer Maternal Grandfather     prostate  . Asthma Mother   . Asthma Brother   . Anesthesia problems Neg Hx   . Hypotension Neg Hx   . Malignant hyperthermia Neg Hx   . Pseudochol deficiency Neg Hx   . Cancer Paternal Grandmother     breast   History  Substance Use Topics  . Smoking status: Never Smoker   . Smokeless tobacco: Never Used  . Alcohol Use: No     Comment: not with preg   OB History    Gravida Para Term Preterm  AB TAB SAB Ectopic Multiple Living   Review of Systems  Constitutional: Negative for fever.  HENT: Positive for sore throat. Negative for trouble swallowing.   Eyes: Negative for visual disturbance.  Respiratory: Positive for cough, chest tightness and shortness of breath.   Cardiovascular: Negative for chest pain.  Gastrointestinal: Negative for nausea, vomiting and abdominal pain.  Genitourinary: Negative for dysuria.  Musculoskeletal: Negative for neck pain.  Skin: Negative for rash.  Neurological: Negative for dizziness, weakness and numbness.  Psychiatric/Behavioral: Negative.     Allergies  Bactrim; Prednisone; Other; Pineapple; Procardia; and Vicodin  Home Medications   Prior to Admission medications   Medication Sig Start Date End Date Taking? Authorizing Provider  albuterol (PROVENTIL HFA;VENTOLIN HFA) 108 (90 BASE) MCG/ACT inhaler Inhale 1-2 puffs into the lungs every 6 (six) hours as needed for wheezing or shortness of breath.    Historical Provider, MD  aspirin-acetaminophen-caffeine (EXCEDRIN MIGRAINE) [phone] MG per tablet Take 2 tablets by mouth every 6 (six) hours as needed for headache or migraine.    Historical Provider, MD  cephALEXin (KEFLEX) 500 MG capsule Take 1 capsule (500 mg total) by mouth 4 (four) times daily. 09/17/14   Derwood Kaplan, MD  doxycycline (VIBRAMYCIN) 100 MG capsule Take 1 capsule (100 mg total) by mouth 2 (two) times daily. 09/17/14   Derwood KaplanAnkit Nanavati, MD  HYDROcodone-acetaminophen (NORCO/VICODIN) 5-325 MG per tablet Take 1 tablet by mouth every 6 (six) hours as needed. 09/17/14   Derwood KaplanAnkit Nanavati, MD  Hydroxychloroquine Sulfate (PLAQUENIL PO) Take 100 mg by mouth daily.    Historical Provider, MD  methylPREDNISolone (MEDROL) 4 MG tablet Take 4 mg by mouth daily.    Historical Provider, MD  metroNIDAZOLE (FLAGYL) 500 MG tablet Take 1 tablet (500 mg total) by mouth 2 (two) times daily. Patient not taking: Reported on 09/17/2014  05/11/14   Bertram DenverKaren E Teague Clark, PA-C  oxyCODONE-acetaminophen (PERCOCET/ROXICET) 5-325 MG per tablet Take 1 tablet by mouth every 6 (six) hours as needed for severe pain. 09/17/14   Ankit Rhunette CroftNanavati, MD   BP 143/77 mmHg  Pulse 95  Temp(Src) 98.5 F (36.9 C) (Oral)  Resp 20  Ht 5\' 8"  (1.727 m)  Wt 310 lb (140.615 kg)  BMI 47.15 kg/m2  SpO2 99%  LMP 09/20/2014 Physical Exam  Constitutional: She is oriented to person, place, and time. She appears well-developed and well-nourished. No distress.  HENT:  Head: Normocephalic and atraumatic.  Mouth/Throat: Oropharynx is clear and moist. No oropharyngeal exudate.  Eyes: Right eye exhibits no discharge. Left eye exhibits no discharge. No scleral icterus.  Neck: Normal range of motion.  Cardiovascular: Normal rate, regular rhythm and normal heart sounds.   No murmur heard. Pulmonary/Chest: Effort normal and breath sounds normal. No accessory muscle usage. No tachypnea. No respiratory distress. She has no wheezes.    Abdominal: Soft. There is no tenderness.  Musculoskeletal: Normal range of motion. She exhibits no edema or tenderness.  Neurological: She is alert and oriented to person, place, and time. No cranial nerve deficit. Coordination normal.  Skin: Skin is warm and dry. No rash noted. She is not diaphoretic.  Psychiatric: She has a normal mood and affect.  Nursing note and vitals reviewed.   ED Course  Procedures (including critical care time) Labs Review Labs Reviewed  RAPID STREP SCREEN  CULTURE, GROUP A STREP  TROPONIN I  CBC WITH DIFFERENTIAL/PLATELET  BASIC METABOLIC PANEL    Imaging Review Dg Chest 2 View  10/12/2014   CLINICAL DATA:  Coughing dyspnea and fever  EXAM: CHEST  2 VIEW  COMPARISON:  09/02/2012  FINDINGS: The heart size and mediastinal contours are within normal limits. Both lungs are clear. The visualized skeletal structures are unremarkable.  IMPRESSION: No active cardiopulmonary disease.   Electronically  Signed   By: Ellery Plunkaniel R Mitchell M.D.   On: 10/12/2014 21:24     EKG Interpretation   Date/Time:  Monday October 12 2014 22:35:59 EDT Ventricular Rate:  93 PR Interval:  148 QRS Duration: 96 QT Interval:  382 QTC Calculation: 474 R Axis:   20 Text Interpretation:  Normal sinus rhythm Normal ECG No significant change  since last tracing Confirmed by Mirian MoGentry, Matthew [phone]([id number]) on 10/12/2014  11:40:38 PM      MDM   Final diagnoses:  Asthma exacerbation   Likely pt experiencing URI which exacerbated asthma s/s.  CP pleuritic in nature.   Patient ambulated in ED with O2 saturations maintained at 100, no current signs of respiratory distress. Lung exam benign throughout ED stay.  CXR WNL.  Strep negative. Pt's chest discomfort resolved after duoneb.  No concern for ACS.  EKG without evidence of injury or ectopy.  PERC negative.  Dexamethasone given in the  ED due to pt reporting anaphylactic rxn to prednisone. Pt states they are breathing at baseline. Pt has been instructed to continue using prescribed medications and to speak with PCP about today's exacerbation.   BP 143/77 mmHg  Pulse 95  Temp(Src) 98.5 F (36.9 C) (Oral)  Resp 20  Ht  (1.727 m)  Wt 310 lb (140.615 kg)  BMI 47.15 kg/m2  SpO2 99%  LMP 09/20/2014  Signed,  Ladona Mow, PA-C 3:34 PM  Patient discussed with Dr. Mirian Mo, MD    Ladona Mow, PA-C 10/13/14 1534  Mirian Mo, MD 10/13/14 [id number]

## 2014-10-12 NOTE — ED Notes (Signed)
Pt states she has been having a cold and chest congestion with shortness of breath since Saturday.

## 2014-10-13 NOTE — ED Notes (Signed)
Pt ambulated with no assistance, o2 sat 100% ra, EDPA notified

## 2014-10-13 NOTE — Discharge Instructions (Signed)

## 2014-10-15 LAB — CULTURE, GROUP A STREP: Strep A Culture: NEGATIVE

## 2014-10-21 ENCOUNTER — Emergency Department (HOSPITAL_BASED_OUTPATIENT_CLINIC_OR_DEPARTMENT_OTHER)
Admission: EM | Admit: 2014-10-21 | Discharge: 2014-10-21 | Disposition: A | Discharge status: Home / Self Care | Payer: Medicaid Other | Attending: Emergency Medicine | Admitting: Emergency Medicine

## 2014-10-21 ENCOUNTER — Emergency Department (HOSPITAL_BASED_OUTPATIENT_CLINIC_OR_DEPARTMENT_OTHER): Payer: Medicaid Other

## 2014-10-21 DIAGNOSIS — Z79899 Other long term (current) drug therapy: Secondary | ICD-10-CM | POA: Diagnosis not present

## 2014-10-21 DIAGNOSIS — J45901 Unspecified asthma with (acute) exacerbation: Secondary | ICD-10-CM | POA: Diagnosis not present

## 2014-10-21 DIAGNOSIS — Z792 Long term (current) use of antibiotics: Secondary | ICD-10-CM | POA: Diagnosis not present

## 2014-10-21 DIAGNOSIS — Z8659 Personal history of other mental and behavioral disorders: Secondary | ICD-10-CM | POA: Diagnosis not present

## 2014-10-21 DIAGNOSIS — R0602 Shortness of breath: Secondary | ICD-10-CM | POA: Diagnosis present

## 2014-10-21 DIAGNOSIS — Z8619 Personal history of other infectious and parasitic diseases: Secondary | ICD-10-CM | POA: Diagnosis not present

## 2014-10-21 DIAGNOSIS — Z8739 Personal history of other diseases of the musculoskeletal system and connective tissue: Secondary | ICD-10-CM | POA: Insufficient documentation

## 2014-10-21 DIAGNOSIS — Z87440 Personal history of urinary (tract) infections: Secondary | ICD-10-CM | POA: Insufficient documentation

## 2014-10-21 MED ORDER — METHYLPREDNISOLONE 4 MG PO KIT: PACK | ORAL | Status: DC

## 2014-10-21 MED ORDER — IPRATROPIUM BROMIDE 0.02 % IN SOLN
0.5000 mg | RESPIRATORY_TRACT | Status: AC
  Administered 2014-10-21: 0.5 mg via RESPIRATORY_TRACT

## 2014-10-21 MED ORDER — ALBUTEROL SULFATE (2.5 MG/3ML) 0.083% IN NEBU
INHALATION_SOLUTION | RESPIRATORY_TRACT | Status: AC
  Administered 2014-10-21: 5 mg via RESPIRATORY_TRACT
  Filled 2014-10-21: qty 6

## 2014-10-21 MED ORDER — ALBUTEROL SULFATE (2.5 MG/3ML) 0.083% IN NEBU
5.0000 mg | INHALATION_SOLUTION | RESPIRATORY_TRACT | Status: AC
  Administered 2014-10-21: 5 mg via RESPIRATORY_TRACT

## 2014-10-21 MED ORDER — ALBUTEROL SULFATE (2.5 MG/3ML) 0.083% IN NEBU
5.0000 mg | INHALATION_SOLUTION | Freq: Once | RESPIRATORY_TRACT | Status: AC
  Administered 2014-10-21: 5 mg via RESPIRATORY_TRACT
  Filled 2014-10-21: qty 6

## 2014-10-21 MED ORDER — ALBUTEROL SULFATE (2.5 MG/3ML) 0.083% IN NEBU: 2.5000 mg | INHALATION_SOLUTION | RESPIRATORY_TRACT | Status: DC | PRN

## 2014-10-21 MED ORDER — DEXAMETHASONE SODIUM PHOSPHATE 10 MG/ML IJ SOLN
10.0000 mg | Freq: Once | INTRAMUSCULAR | Status: AC
  Administered 2014-10-21: 10 mg via INTRAVENOUS
  Filled 2014-10-21: qty 1

## 2014-10-21 MED ORDER — MAGNESIUM SULFATE 2 GM/50ML IV SOLN
2.0000 g | Freq: Once | INTRAVENOUS | Status: AC
  Administered 2014-10-21: 2 g via INTRAVENOUS
  Filled 2014-10-21: qty 50

## 2014-10-21 MED ORDER — IPRATROPIUM BROMIDE 0.02 % IN SOLN
RESPIRATORY_TRACT | Status: AC
  Administered 2014-10-21: 0.5 mg via RESPIRATORY_TRACT
  Filled 2014-10-21: qty 2.5

## 2014-10-21 NOTE — ED Provider Notes (Signed)
CSN: [id number]     Arrival date & time 10/21/14  1859 History  This chart was scribed for Geoffery Lyons, MD by Freida Busman, ED Scribe. This patient was seen in room MH03/MH03 and the patient's care was started 7:32 PM.    Chief Complaint  Patient presents with  . Shortness of Breath     Patient is a 29 y.o. female presenting with shortness of breath. The history is provided by the patient. No language interpreter was used.  Shortness of Breath Severity:  Moderate Onset quality:  Gradual Duration:  5 days Timing:  Constant Progression:  Unchanged Chronicity:  New Relieved by:  Nothing Ineffective treatments:  Inhaler Associated symptoms: cough   Associated symptoms: no abdominal pain, no chest pain and no fever   Cough:    Cough characteristics:  Productive   Sputum characteristics:  Green   Timing:  Constant    HPI Comments:  Hayley Webb is a 29 y.o. female with a h/o asthma presents to the Emergency Department complaining of SOB for about 5 days. She reports associated productive cough with green phlegm and central chest tightness.  She used a neb tx (that was over 30 years old) and albuterol inhaler without relief. She states her last "bad" asthma flare up was about 10 years ago and was not as bad as today's episode. She denies fever, chills, pedal edema and abdominal pain.    Past Medical History  Diagnosis Date  . 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 HSV infection   . Asthma   . Lupus     dx age 58  . Headache(784.0)   . Infection     UTI  . Anxiety     doing good now   Past Surgical History  Procedure Laterality Date  . Tonsillectomy and adenoidectomy  1998  . Mouth surgery      2 teeth removed- 1 wisdom and 1 in front of it  . Wisdom tooth extraction     Family History  Problem Relation Age of Onset  . Diabetes Maternal Aunt   . Cancer Maternal Grandmother 72    COLON CA  . Diabetes Maternal Grandmother   . Hypertension  Maternal Grandfather   . Cancer Maternal Grandfather     prostate  . Asthma Mother   . Asthma Brother   . Anesthesia problems Neg Hx   . Hypotension Neg Hx   . Malignant hyperthermia Neg Hx   . Pseudochol deficiency Neg Hx   . Cancer Paternal Grandmother     breast   History  Substance Use Topics  . Smoking status: Never Smoker   . Smokeless tobacco: Never Used  . Alcohol Use: No     Comment: not with preg   OB History    Gravida Para Term Preterm AB TAB SAB Ectopic Multiple Living   Review of Systems  Constitutional: Negative for fever.  Respiratory: Positive for cough, chest tightness and shortness of breath.   Cardiovascular: Negative for chest pain.  Gastrointestinal: Negative for abdominal pain.  All other systems reviewed and are negative.     Allergies  Bactrim; Prednisone; Other; Pineapple; Procardia; and Vicodin  Home Medications   Prior to Admission medications   Medication Sig Start Date End Date Taking? Authorizing Provider  albuterol (PROVENTIL HFA;VENTOLIN HFA) 108 (90 BASE) MCG/ACT inhaler Inhale 1-2 puffs into the lungs every 6 (  six) hours as needed for wheezing or shortness of breath.   Yes Historical Provider, MD  aspirin-acetaminophen-caffeine (EXCEDRIN MIGRAINE) [id number] MG per tablet Take 2 tablets by mouth every 6 (six) hours as needed for headache or migraine.   Yes Historical Provider, MD  Hydroxychloroquine Sulfate (PLAQUENIL PO) Take 100 mg by mouth daily.   Yes Historical Provider, MD  methylPREDNISolone (MEDROL) 4 MG tablet Take 4 mg by mouth daily.   Yes Historical Provider, MD  cephALEXin (KEFLEX) 500 MG capsule Take 1 capsule (500 mg total) by mouth 4 (four) times daily. 09/17/14   Derwood KaplanAnkit Nanavati, MD  doxycycline (VIBRAMYCIN) 100 MG capsule Take 1 capsule (100 mg total) by mouth 2 (two) times daily. 09/17/14   Derwood KaplanAnkit Nanavati, MD  HYDROcodone-acetaminophen (NORCO/VICODIN) 5-325 MG per tablet Take 1 tablet by mouth every  6 (six) hours as needed. 09/17/14   Derwood KaplanAnkit Nanavati, MD  metroNIDAZOLE (FLAGYL) 500 MG tablet Take 1 tablet (500 mg total) by mouth 2 (two) times daily. Patient not taking: Reported on 09/17/2014 05/11/14   Bertram DenverKaren E Teague Clark, PA-C  oxyCODONE-acetaminophen (PERCOCET/ROXICET) 5-325 MG per tablet Take 1 tablet by mouth every 6 (six) hours as needed for severe pain. 09/17/14   Ankit Rhunette CroftNanavati, MD   BP 163/98 mmHg  Pulse 95  Temp(Src) 98 F (36.7 C) (Oral)  Resp 20  Ht 5\' 8"  (1.727 m)  Wt 315 lb (142.883 kg)  BMI 47.91 kg/m2  SpO2 100%  LMP 10/18/2014 Physical Exam  Constitutional: She is oriented to person, place, and time. She appears well-developed and well-nourished. No distress.  HENT:  Head: Normocephalic and atraumatic.  Right Ear: Hearing normal.  Left Ear: Hearing normal.  Nose: Nose normal.  Mouth/Throat: Oropharynx is clear and moist and mucous membranes are normal.  Eyes: Conjunctivae and EOM are normal. Pupils are equal, round, and reactive to light.  Neck: Normal range of motion. Neck supple.  Cardiovascular: Normal rate, regular rhythm, S1 normal and S2 normal.  Exam reveals no gallop and no friction rub.   No murmur heard. Pulmonary/Chest: Effort normal and breath sounds normal. No respiratory distress. She has no wheezes. She exhibits no tenderness.  Abdominal: Soft. Normal appearance and bowel sounds are normal. There is no hepatosplenomegaly. There is no tenderness. There is no rebound, no guarding, no tenderness at McBurney's point and negative Murphy's sign. No hernia.  Musculoskeletal: Normal range of motion.  Neurological: She is alert and oriented to person, place, and time. She has normal strength. No cranial nerve deficit or sensory deficit. Coordination normal. GCS eye subscore is 4. GCS verbal subscore is 5. GCS motor subscore is 6.  Skin: Skin is warm, dry and intact. No rash noted. No cyanosis.  Psychiatric: She has a normal mood and affect. Her speech is normal  and behavior is normal. Thought content normal.  Nursing note and vitals reviewed.   ED Course  Procedures   DIAGNOSTIC STUDIES:  Oxygen Saturation is 100% on RA, normal by my interpretation.    COORDINATION OF CARE:  7:40 PM Discussed treatment plan with pt at bedside and pt agreed to plan.  Labs Review Labs Reviewed - No data to display  Imaging Review No results found.   EKG Interpretation None      MDM   Final diagnoses:  None    Patient with history of asthma, lupus.  She presents with wheezing and difficulty breathing for the past several days.  She has a home neb, however the albuterol solution is old  and not working.  She is feeling better and wheezing has resolved with nebs and steroids.  Saturations are in the upper 90's with no tachycardia.  She will be discharged with a medrol dose pack, albuterol solution for her nebulizer, prn return.  I personally performed the services described in this documentation, which was scribed in my presence. The recorded information has been reviewed and is accurate.     Geoffery Lyons, MD 10/22/14 [phone]

## 2014-10-21 NOTE — Discharge Instructions (Signed)
Medrol Dosepak as prescribed.  Albuterol neb every 4 hours as needed for wheezing.  Return to the ER if your symptoms significantly worsen or change.   Asthma Asthma is a recurring condition in which the airways tighten and narrow. Asthma can make it difficult to breathe. It can cause coughing, wheezing, and shortness of breath. Asthma episodes, also called asthma attacks, range from minor to life-threatening. Asthma cannot be cured, but medicines and lifestyle changes can help control it. CAUSES Asthma is believed to be caused by inherited (genetic) and environmental factors, but its exact cause is unknown. Asthma may be triggered by allergens, lung infections, or irritants in the air. Asthma triggers are different for each person. Common triggers include:   Animal dander.  Dust mites.  Cockroaches.  Pollen from trees or grass.  Mold.  Smoke.  Air pollutants such as dust, household cleaners, hair sprays, aerosol sprays, paint fumes, strong chemicals, or strong odors.  Cold air, weather changes, and winds (which increase molds and pollens in the air).  Strong emotional expressions such as crying or laughing hard.  Stress.  Certain medicines (such as aspirin) or types of drugs (such as beta-blockers).  Sulfites in foods and drinks. Foods and drinks that may contain sulfites include dried fruit, potato chips, and sparkling grape juice.  Infections or inflammatory conditions such as the flu, a cold, or an inflammation of the nasal membranes (rhinitis).  Gastroesophageal reflux disease (GERD).  Exercise or strenuous activity. SYMPTOMS Symptoms may occur immediately after asthma is triggered or many hours later. Symptoms include:  Wheezing.  Excessive nighttime or early morning coughing.  Frequent or severe coughing with a common cold.  Chest tightness.  Shortness of breath. DIAGNOSIS  The diagnosis of asthma is made by a review of your medical history and a physical  exam. Tests may also be performed. These may include:  Lung function studies. These tests show how much air you breathe in and out.  Allergy tests.  Imaging tests such as X-rays. TREATMENT  Asthma cannot be cured, but it can usually be controlled. Treatment involves identifying and avoiding your asthma triggers. It also involves medicines. There are 2 classes of medicine used for asthma treatment:   Controller medicines. These prevent asthma symptoms from occurring. They are usually taken every day.  Reliever or rescue medicines. These quickly relieve asthma symptoms. They are used as needed and provide short-term relief. Your health care provider will help you create an asthma action plan. An asthma action plan is a written plan for managing and treating your asthma attacks. It includes a list of your asthma triggers and how they may be avoided. It also includes information on when medicines should be taken and when their dosage should be changed. An action plan may also involve the use of a device called a peak flow meter. A peak flow meter measures how well the lungs are working. It helps you monitor your condition. HOME CARE INSTRUCTIONS   Take medicines only as directed by your health care provider. Speak with your health care provider if you have questions about how or when to take the medicines.  Use a peak flow meter as directed by your health care provider. Record and keep track of readings.  Understand and use the action plan to help minimize or stop an asthma attack without needing to seek medical care.  Control your home environment in the following ways to help prevent asthma attacks:  Do not smoke. Avoid being exposed to secondhand  smoke.  Change your heating and air conditioning filter regularly.  Limit your use of fireplaces and wood stoves.  Get rid of pests (such as roaches and mice) and their droppings.  Throw away plants if you see mold on them.  Clean your  floors and dust regularly. Use unscented cleaning products.  Try to have someone else vacuum for you regularly. Stay out of rooms while they are being vacuumed and for a short while afterward. If you vacuum, use a dust mask from a hardware store, a double-layered or microfilter vacuum cleaner bag, or a vacuum cleaner with a HEPA filter.  Replace carpet with wood, tile, or vinyl flooring. Carpet can trap dander and dust.  Use allergy-proof pillows, mattress covers, and box spring covers.  Wash bed sheets and blankets every week in hot water and dry them in a dryer.  Use blankets that are made of polyester or cotton.  Clean bathrooms and kitchens with bleach. If possible, have someone repaint the walls in these rooms with mold-resistant paint. Keep out of the rooms that are being cleaned and painted.  Wash hands frequently. SEEK MEDICAL CARE IF:   You have wheezing, shortness of breath, or a cough even if taking medicine to prevent attacks.  The colored mucus you cough up (sputum) is thicker than usual.  Your sputum changes from clear or white to yellow, green, gray, or bloody.  You have any problems that may be related to the medicines you are taking (such as a rash, itching, swelling, or trouble breathing).  You are using a reliever medicine more than 2-3 times per week.  Your peak flow is still at 50-79% of your personal best after following your action plan for 1 hour.  You have a fever. SEEK IMMEDIATE MEDICAL CARE IF:   You seem to be getting worse and are unresponsive to treatment during an asthma attack.  You are short of breath even at rest.  You get short of breath when doing very little physical activity.  You have difficulty eating, drinking, or talking due to asthma symptoms.  You develop chest pain.  You develop a fast heartbeat.  You have a bluish color to your lips or fingernails.  You are light-headed, dizzy, or faint.  Your peak flow is less than 50% of  your personal best. MAKE SURE YOU:   Understand these instructions.  Will watch your condition.  Will get help right away if you are not doing well or get worse. Document Released: 07/10/2005 Document Revised: 11/24/2013 Document Reviewed: 02/06/2013 [facility] Patient Information 2015 [place], [place]. This information is not intended to replace advice given to you by your health care provider. Make sure you discuss any questions you have with your health care provider.

## 2014-10-21 NOTE — ED Notes (Signed)
Pt reports shortness of breath since Saturday. Has been using inhaler and nebulizer without relief. Pt reports she was here last week for the same

## 2014-11-17 ENCOUNTER — Inpatient Hospital Stay (HOSPITAL_COMMUNITY)
Admission: AD | Admit: 2014-11-17 | Discharge: 2014-11-17 | Disposition: A | Discharge status: Home / Self Care | Payer: Managed Care, Other (non HMO) | Source: Ambulatory Visit | Attending: Obstetrics & Gynecology | Admitting: Obstetrics & Gynecology

## 2014-11-17 ENCOUNTER — Encounter (HOSPITAL_COMMUNITY): Payer: Self-pay

## 2014-11-17 DIAGNOSIS — R109 Unspecified abdominal pain: Secondary | ICD-10-CM | POA: Diagnosis not present

## 2014-11-17 DIAGNOSIS — A5901 Trichomonal vulvovaginitis: Secondary | ICD-10-CM | POA: Diagnosis not present

## 2014-11-17 DIAGNOSIS — N898 Other specified noninflammatory disorders of vagina: Secondary | ICD-10-CM | POA: Diagnosis present

## 2014-11-17 DIAGNOSIS — A599 Trichomoniasis, unspecified: Secondary | ICD-10-CM

## 2014-11-17 LAB — CBC
HCT: 37 % (ref 36.0–46.0)
Hemoglobin: 12.3 g/dL (ref 12.0–15.0)
MCH: 27.8 pg (ref 26.0–34.0)
MCHC: 33.2 g/dL (ref 30.0–36.0)
MCV: 83.5 fL (ref 78.0–100.0)
PLATELETS: 327 10*3/uL (ref 150–400)
RBC: 4.43 MIL/uL (ref 3.87–5.11)
RDW: 13.9 % (ref 11.5–15.5)
WBC: 7.8 10*3/uL (ref 4.0–10.5)

## 2014-11-17 LAB — WET PREP, GENITAL
Clue Cells Wet Prep HPF POC: NONE SEEN
Yeast Wet Prep HPF POC: NONE SEEN

## 2014-11-17 LAB — URINALYSIS, ROUTINE W REFLEX MICROSCOPIC
Bilirubin Urine: NEGATIVE
Glucose, UA: NEGATIVE mg/dL
Ketones, ur: 15 mg/dL — AB
Nitrite: NEGATIVE
PROTEIN: NEGATIVE mg/dL
Specific Gravity, Urine: >1.03 — ABNORMAL HIGH (ref 1.005–1.030)
Urobilinogen, UA: 0.2 mg/dL (ref 0.0–1.0)
pH: 6 (ref 5.0–8.0)

## 2014-11-17 LAB — HIV ANTIBODY (ROUTINE TESTING W REFLEX): HIV Screen 4th Generation wRfx: NONREACTIVE

## 2014-11-17 LAB — GC/CHLAMYDIA PROBE AMP (~~LOC~~) NOT AT ARMC
CHLAMYDIA, DNA PROBE: NEGATIVE
Neisseria Gonorrhea: NEGATIVE

## 2014-11-17 LAB — URINE MICROSCOPIC-ADD ON

## 2014-11-17 LAB — POCT PREGNANCY, URINE: Preg Test, Ur: NEGATIVE

## 2014-11-17 MED ORDER — METRONIDAZOLE 500 MG PO TABS
2000.0000 mg | ORAL_TABLET | Freq: Once | ORAL | Status: AC
  Administered 2014-11-17: 2000 mg via ORAL
  Filled 2014-11-17: qty 4

## 2014-11-17 NOTE — Discharge Instructions (Signed)

## 2014-11-17 NOTE — MAU Note (Signed)
Lower abdominal cramping x 4 days. LMP 10/16/14. No birth control. Negative pregnancy test yesterday.   Thought had yeast infection last week, took monistat. Vaginal discharge is now clear/yellow with foul odor.  Vaginal spotting on Saturday, no bleeding since then.

## 2014-11-17 NOTE — MAU Provider Note (Signed)
History     CSN: [id number]  Arrival date and time: 11/17/14 0019   First Provider Initiated Contact with Patient 11/17/14 0047      Chief Complaint  Patient presents with  . Abdominal Pain  . Possible Pregnancy  . Vaginal Discharge   HPI Comments: Hayley Webb is a 29 y.o. G3P1011 who presents today with a 4 day hx of vaginal discharge and cramping. She states that she tried monistat, and it did not help at all. She states that the discharge has an odor and is itchy.   Abdominal Pain This is a new problem. The current episode started in the past 7 days. The onset quality is sudden. The problem occurs constantly. The problem has been unchanged. The pain is located in the suprapubic region. The pain is at a severity of 5/10. The quality of the pain is cramping. The abdominal pain does not radiate. Pertinent negatives include no constipation, diarrhea, dysuria, fever, frequency, nausea or vomiting. Nothing aggravates the pain. The pain is relieved by nothing. She has tried nothing for the symptoms.  Vaginal Discharge The patient's primary symptoms include vaginal discharge. This is a new problem. The current episode started in the past 7 days. The problem occurs constantly. The problem has been unchanged. The pain is moderate. The problem affects both sides. She is not pregnant. Associated symptoms include abdominal pain. Pertinent negatives include no constipation, diarrhea, dysuria, fever, frequency, nausea, urgency or vomiting. The vaginal discharge was thin. The vaginal bleeding is spotting. She has not been passing clots. She has not been passing tissue. Nothing aggravates the symptoms. She has tried antifungals for the symptoms. The treatment provided no relief. She is sexually active. It is unknown whether or not her partner has an STD. She uses nothing for contraception.    OB History    Gravida Para Term Preterm AB TAB SAB Ectopic Multiple Living   Past  Medical History  Diagnosis Date  . 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 HSV infection   . Asthma   . Lupus     dx age 88  . Headache(784.0)   . Infection     UTI  . Anxiety     doing good now    Past Surgical History  Procedure Laterality Date  . Tonsillectomy and adenoidectomy  1998  . Mouth surgery      2 teeth removed- 1 wisdom and 1 in front of it  . Wisdom tooth extraction      Family History  Problem Relation Age of Onset  . Diabetes Maternal Aunt   . Cancer Maternal Grandmother 72    COLON CA  . Diabetes Maternal Grandmother   . Hypertension Maternal Grandfather   . Cancer Maternal Grandfather     prostate  . Asthma Mother   . Asthma Brother   . Anesthesia problems Neg Hx   . Hypotension Neg Hx   . Malignant hyperthermia Neg Hx   . Pseudochol deficiency Neg Hx   . Cancer Paternal Grandmother     breast    History  Substance Use Topics  . Smoking status: Never Smoker   . Smokeless tobacco: Never Used  . Alcohol Use: No     Comment: not with preg    Allergies:  Allergies  Allergen Reactions  . Bactrim Anaphylaxis and Hives  . Prednisone Anaphylaxis and Hives  Has been on Dexamethasone without issue.  . Other Itching    Shrimp:throat itching "can eat other shellfish"  . Pineapple Itching  . Procardia [Nifedipine] Swelling    Swelling of tongue  . Vicodin [Hydrocodone-Acetaminophen] Hives and Swelling    Can take Percocet without difficulty    Prescriptions prior to admission  Medication Sig Dispense Refill Last Dose  . albuterol (PROVENTIL) (2.5 MG/3ML) 0.083% nebulizer solution Take 3 mLs (2.5 mg total) by nebulization every 4 (four) hours as needed for wheezing or shortness of breath. 30 vial 0 Past Week at Unknown time  . aspirin-acetaminophen-caffeine (EXCEDRIN MIGRAINE) 250-250-65 MG per tablet Take 2 tablets by mouth every 6 (six) hours as needed for headache or migraine.   Past Week at Unknown time  .  Hydroxychloroquine Sulfate (PLAQUENIL PO) Take 100 mg by mouth daily.   11/16/2014 at Unknown time  . methylPREDNISolone (MEDROL DOSEPAK) 4 MG tablet Take 3 tablets daily for the first 3 days, then 2 tablets daily for 3 days then 1 tablet daily thereafter. 21 tablet 0 11/16/2014 at Unknown time  . cephALEXin (KEFLEX) 500 MG capsule Take 1 capsule (500 mg total) by mouth 4 (four) times daily. 28 capsule 0   . doxycycline (VIBRAMYCIN) 100 MG capsule Take 1 capsule (100 mg total) by mouth 2 (two) times daily. 20 capsule 0   . HYDROcodone-acetaminophen (NORCO/VICODIN) 5-325 MG per tablet Take 1 tablet by mouth every 6 (six) hours as needed. 15 tablet 0   . metroNIDAZOLE (FLAGYL) 500 MG tablet Take 1 tablet (500 mg total) by mouth 2 (two) times daily. (Patient not taking: Reported on 09/17/2014) 14 tablet 0   . oxyCODONE-acetaminophen (PERCOCET/ROXICET) 5-325 MG per tablet Take 1 tablet by mouth every 6 (six) hours as needed for severe pain. 15 tablet 0     Review of Systems  Constitutional: Negative for fever.  Gastrointestinal: Positive for abdominal pain. Negative for nausea, vomiting, diarrhea and constipation.  Genitourinary: Positive for vaginal discharge. Negative for dysuria, urgency and frequency.   Physical Exam   Blood pressure 148/101, pulse 92, temperature 98.4 F (36.9 C), temperature source Oral, resp. rate 18, height 5' 8.5" (1.74 m), weight 149.415 kg (329 lb 6.4 oz), last menstrual period 10/16/2014, SpO2 100 %, not currently breastfeeding.  Physical Exam  Nursing note and vitals reviewed. Constitutional: She is oriented to person, place, and time. She appears well-developed and well-nourished. No distress.  Cardiovascular: Normal rate.   Respiratory: Effort normal.  GI: Soft. There is no tenderness. There is no rebound.  Genitourinary:   External: no lesion Vagina: small amount of thin, yellow discharge  Cervix: erythematous and friable, no CMT Uterus: NSSC Adnexa: NT    Neurological: She is alert and oriented to person, place, and time.  Skin: Skin is warm and dry.  Psychiatric: She has a normal mood and affect.   Results for orders placed or performed during the hospital encounter of 11/17/14 (from the past 24 hour(s))  Pregnancy, urine POC     Status: None   Collection Time: 11/17/14 12:46 AM  Result Value Ref Range   Preg Test, Ur NEGATIVE NEGATIVE  Wet prep, genital     Status: Abnormal   Collection Time: 11/17/14 12:59 AM  Result Value Ref Range   Yeast Wet Prep HPF POC NONE SEEN NONE SEEN   Trich, Wet Prep FEW (A) NONE SEEN   Clue Cells Wet Prep HPF POC NONE SEEN NONE SEEN   WBC, Wet Prep HPF POC MODERATE (  A) NONE SEEN  CBC     Status: None   Collection Time: 11/17/14 12:59 AM  Result Value Ref Range   WBC 7.8 4.0 - 10.5 K/uL   RBC 4.43 3.87 - 5.11 MIL/uL   Hemoglobin 12.3 12.0 - 15.0 g/dL   HCT 95.637.0 21.336.0 - 08.646.0 %   MCV 83.5 78.0 - 100.0 fL   MCH 27.8 26.0 - 34.0 pg   MCHC 33.2 30.0 - 36.0 g/dL   RDW 57.813.9 46.911.5 - 62.915.5 %   Platelets 327 150 - 400 K/uL    MAU Course  Procedures  MDM Patient treated here in MAU with 2 g flagyl   Assessment and Plan   1. Trichomonal infection    DC home Partner treatment information given Return to MAU as needed  Follow-up Information    [facility]   Specialty:  Obstetrics and Gynecology   Why:  As needed   Contact information:   [address] [place] [id number] [phone]       Tawnya CrookHogan, Heather Donovan 11/17/2014, 12:56 AM

## 2014-12-04 ENCOUNTER — Emergency Department (HOSPITAL_BASED_OUTPATIENT_CLINIC_OR_DEPARTMENT_OTHER)
Admission: EM | Admit: 2014-12-04 | Discharge: 2014-12-05 | Disposition: A | Discharge status: Home / Self Care | Payer: Managed Care, Other (non HMO) | Attending: Emergency Medicine | Admitting: Emergency Medicine

## 2014-12-04 ENCOUNTER — Encounter (HOSPITAL_BASED_OUTPATIENT_CLINIC_OR_DEPARTMENT_OTHER): Payer: Self-pay | Admitting: *Deleted

## 2014-12-04 DIAGNOSIS — Z3202 Encounter for pregnancy test, result negative: Secondary | ICD-10-CM | POA: Insufficient documentation

## 2014-12-04 DIAGNOSIS — J45909 Unspecified asthma, uncomplicated: Secondary | ICD-10-CM | POA: Insufficient documentation

## 2014-12-04 DIAGNOSIS — Z8659 Personal history of other mental and behavioral disorders: Secondary | ICD-10-CM | POA: Insufficient documentation

## 2014-12-04 DIAGNOSIS — Z8739 Personal history of other diseases of the musculoskeletal system and connective tissue: Secondary | ICD-10-CM | POA: Insufficient documentation

## 2014-12-04 DIAGNOSIS — Z79899 Other long term (current) drug therapy: Secondary | ICD-10-CM | POA: Insufficient documentation

## 2014-12-04 DIAGNOSIS — Z7952 Long term (current) use of systemic steroids: Secondary | ICD-10-CM | POA: Insufficient documentation

## 2014-12-04 DIAGNOSIS — Z8619 Personal history of other infectious and parasitic diseases: Secondary | ICD-10-CM | POA: Insufficient documentation

## 2014-12-04 DIAGNOSIS — Z7982 Long term (current) use of aspirin: Secondary | ICD-10-CM | POA: Insufficient documentation

## 2014-12-04 DIAGNOSIS — Z87440 Personal history of urinary (tract) infections: Secondary | ICD-10-CM | POA: Insufficient documentation

## 2014-12-04 DIAGNOSIS — N730 Acute parametritis and pelvic cellulitis: Secondary | ICD-10-CM

## 2014-12-04 LAB — URINALYSIS, ROUTINE W REFLEX MICROSCOPIC
Bilirubin Urine: NEGATIVE
Glucose, UA: NEGATIVE mg/dL
Ketones, ur: 15 mg/dL — AB
Nitrite: NEGATIVE
Protein, ur: NEGATIVE mg/dL
Specific Gravity, Urine: 1.041 — ABNORMAL HIGH (ref 1.005–1.030)
Urobilinogen, UA: 1 mg/dL (ref 0.0–1.0)
pH: 5.5 (ref 5.0–8.0)

## 2014-12-04 LAB — URINE MICROSCOPIC-ADD ON

## 2014-12-04 LAB — PREGNANCY, URINE: Preg Test, Ur: NEGATIVE

## 2014-12-04 NOTE — ED Notes (Signed)
Pt c/o vaginal discharge x 1 week.  

## 2014-12-05 LAB — WET PREP, GENITAL
Clue Cells Wet Prep HPF POC: NONE SEEN
Trich, Wet Prep: NONE SEEN
YEAST WET PREP: NONE SEEN

## 2014-12-05 MED ORDER — DOXYCYCLINE HYCLATE 100 MG PO CAPS: 100.0000 mg | ORAL_CAPSULE | Freq: Two times a day (BID) | ORAL | Status: DC

## 2014-12-05 MED ORDER — CEFTRIAXONE SODIUM 250 MG IJ SOLR
250.0000 mg | INTRAMUSCULAR | Status: DC
  Administered 2014-12-05: 250 mg via INTRAMUSCULAR
  Filled 2014-12-05: qty 250

## 2014-12-05 MED ORDER — AZITHROMYCIN 250 MG PO TABS
1000.0000 mg | ORAL_TABLET | Freq: Once | ORAL | Status: AC
  Administered 2014-12-05: 1000 mg via ORAL
  Filled 2014-12-05: qty 4

## 2014-12-05 MED ORDER — LIDOCAINE HCL (PF) 1 % IJ SOLN
INTRAMUSCULAR | Status: AC
  Administered 2014-12-05: 1.2 mL
  Filled 2014-12-05: qty 5

## 2014-12-05 NOTE — ED Provider Notes (Signed)
CSN: [id number]     Arrival date & time 12/04/14  2209 History   First MD Initiated Contact with Patient 12/05/14 0011     Chief Complaint  Patient presents with  . Vaginal Discharge      HPI   patient presents for evaluation of vaginal discharge. [facility] with Trichomonas week ago. Given Flagyl. Discharged improved. She since had a menstrual cycle. Continued discharge and pain today she presents here. Past medical history Lupus. Previous history of GC. Followed at [facility] for primary care.    Past Medical History  Diagnosis Date  . 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 HSV infection   . Asthma   . Lupus     dx age 29  . Headache(784.0)   . Infection     UTI  . Anxiety     doing good now   Past Surgical History  Procedure Laterality Date  . Tonsillectomy and adenoidectomy  1998  . Mouth surgery      2 teeth removed- 1 wisdom and 1 in front of it  . Wisdom tooth extraction     Family History  Problem Relation Age of Onset  . Diabetes Maternal Aunt   . Cancer Maternal Grandmother 72    COLON CA  . Diabetes Maternal Grandmother   . Hypertension Maternal Grandfather   . Cancer Maternal Grandfather     prostate  . Asthma Mother   . Asthma Brother   . Anesthesia problems Neg Hx   . Hypotension Neg Hx   . Malignant hyperthermia Neg Hx   . Pseudochol deficiency Neg Hx   . Cancer Paternal Grandmother     breast   History  Substance Use Topics  . Smoking status: Never Smoker   . Smokeless tobacco: Never Used  . Alcohol Use: No     Comment: not with preg   OB History    Gravida Para Term Preterm AB TAB SAB Ectopic Multiple Living   3 1 1  1  1   1      Review of Systems  Constitutional: Negative for fever, chills, diaphoresis, appetite change and fatigue.  HENT: Negative for mouth sores, sore throat and trouble swallowing.   Eyes: Negative for visual disturbance.  Respiratory: Negative for  cough, chest tightness, shortness of breath and wheezing.   Cardiovascular: Negative for chest pain.  Gastrointestinal: Negative for nausea, vomiting, abdominal pain, diarrhea and abdominal distention.  Endocrine: Negative for polydipsia, polyphagia and polyuria.  Genitourinary: Positive for vaginal discharge and pelvic pain. Negative for dysuria, frequency and hematuria.  Musculoskeletal: Negative for gait problem.  Skin: Negative for color change, pallor and rash.  Neurological: Negative for dizziness, syncope, light-headedness and headaches.  Hematological: Does not bruise/bleed easily.  Psychiatric/Behavioral: Negative for behavioral problems and confusion.      Allergies  Bactrim; Prednisone; Other; Pineapple; Procardia; and Vicodin  Home Medications   Prior to Admission medications   Medication Sig Start Date End Date Taking? Authorizing Provider  albuterol (PROVENTIL) (2.5 MG/3ML) 0.083% nebulizer solution Take 3 mLs (2.5 mg total) by nebulization every 4 (four) hours as needed for wheezing or shortness of breath. 10/21/14   Geoffery Lyonsouglas Delo, MD  aspirin-acetaminophen-caffeine (EXCEDRIN MIGRAINE) 9098155027250-250-65 MG per tablet Take 2 tablets by mouth every 6 (six) hours as needed for headache or migraine.    Historical Provider, MD  doxycycline (VIBRAMYCIN) 100 MG capsule Take 1 capsule (100 mg total) by mouth 2 (  two) times daily. 12/05/14   Rolland PorterMark Winfield Caba, MD  Hydroxychloroquine Sulfate (PLAQUENIL PO) Take 100 mg by mouth daily.    Historical Provider, MD  methylPREDNISolone (MEDROL DOSEPAK) 4 MG tablet Take 3 tablets daily for the first 3 days, then 2 tablets daily for 3 days then 1 tablet daily thereafter. 10/21/14   Geoffery Lyonsouglas Delo, MD   BP 134/81 mmHg  Pulse 90  Temp(Src) 97.5 F (36.4 C) (Oral)  Resp 20  Wt 330 lb (149.687 kg)  SpO2 100%  LMP 11/21/2014 Physical Exam  Constitutional: She is oriented to person, place, and time. She appears well-developed and well-nourished. No distress.    HENT:  Head: Normocephalic.  Eyes: Conjunctivae are normal. Pupils are equal, round, and reactive to light. No scleral icterus.  Neck: Normal range of motion. Neck supple. No thyromegaly present.  Cardiovascular: Normal rate and regular rhythm.  Exam reveals no gallop and no friction rub.   No murmur heard. Pulmonary/Chest: Effort normal and breath sounds normal. No respiratory distress. She has no wheezes. She has no rales.  Abdominal: Soft. Bowel sounds are normal. She exhibits no distension. There is no tenderness. There is no rebound.  Genitourinary:    Musculoskeletal: Normal range of motion.  Neurological: She is alert and oriented to person, place, and time.  Skin: Skin is warm and dry. No rash noted.  Psychiatric: She has a normal mood and affect. Her behavior is normal.    ED Course  Procedures (including critical care time) Labs Review Labs Reviewed  WET PREP, GENITAL - Abnormal; Notable for the following:    WBC, Wet Prep HPF POC MODERATE (*)    All other components within normal limits  URINALYSIS, ROUTINE W REFLEX MICROSCOPIC - Abnormal; Notable for the following:    Color, Urine AMBER (*)    APPearance CLOUDY (*)    Specific Gravity, Urine 1.041 (*)    Hgb urine dipstick SMALL (*)    Ketones, ur 15 (*)    Leukocytes, UA SMALL (*)    All other components within normal limits  URINE MICROSCOPIC-ADD ON - Abnormal; Notable for the following:    Squamous Epithelial / LPF FEW (*)    Bacteria, UA MANY (*)    All other components within normal limits  PREGNANCY, URINE  GC/CHLAMYDIA PROBE AMP ([place])    Imaging Review No results found.   EKG Interpretation None      MDM   Final diagnoses:  PID (acute pelvic inflammatory disease)    Patient with cervical motion tenderness. Negative wet prep. Given IM Rocephin, by mouth Zithromax for discharge home 10 days doxy. Primary care follow-up for results. Partner should be treated.    Rolland PorterMark Vallerie Hentz,  MD 12/05/14 [phone]

## 2014-12-05 NOTE — Discharge Instructions (Signed)
Recheck with your physician at [facility] next week for results of your testing. Encourage your sexual partner to be evaluated and treated to prevent reinfection.   Pelvic Inflammatory Disease Pelvic inflammatory disease (PID) is an infection in some or all of the female organs. PID can be in the uterus, ovaries, fallopian tubes, or the surrounding tissues inside the lower belly area (pelvis). HOME CARE   If given, take your antibiotic medicine as told. Finish them even if you start to feel better.  Only take medicine as told by your doctor.  Do not have sex (intercourse) until treatment is done or as told by your doctor.  Tell your sex partner if you have PID. Your partner may need to be treated.  Keep all doctor visits. GET HELP RIGHT AWAY IF:   You have a fever.  You have more belly (abdominal) or lower belly pain.  You have chills.  You have pain when you pee (urinate).  You are not better after 72 hours.  You have more fluid (discharge) coming from your vagina or fluid that is not normal.  You need pain medicine from your doctor.  You throw up (vomit).  You cannot take your medicines.  Your partner has a sexually transmitted disease (STD). MAKE SURE YOU:   Understand these instructions.  Will watch your condition.  Will get help right away if you are not doing well or get worse. Document Released: 10/06/2008 Document Revised: 11/04/2012 Document Reviewed: 07/06/2011 [facility] Patient Information 2015 [place], [place]. This information is not intended to replace advice given to you by your health care provider. Make sure you discuss any questions you have with your health care provider.

## 2014-12-07 LAB — GC/CHLAMYDIA PROBE AMP (~~LOC~~) NOT AT ARMC
Chlamydia: NEGATIVE
Neisseria Gonorrhea: NEGATIVE

## 2015-01-03 ENCOUNTER — Encounter (HOSPITAL_BASED_OUTPATIENT_CLINIC_OR_DEPARTMENT_OTHER): Payer: Self-pay | Admitting: Adult Health

## 2015-01-03 ENCOUNTER — Emergency Department (HOSPITAL_BASED_OUTPATIENT_CLINIC_OR_DEPARTMENT_OTHER)
Admission: EM | Admit: 2015-01-03 | Discharge: 2015-01-03 | Disposition: A | Discharge status: Home / Self Care | Payer: Managed Care, Other (non HMO) | Attending: Emergency Medicine | Admitting: Emergency Medicine

## 2015-01-03 DIAGNOSIS — T63444A Toxic effect of venom of bees, undetermined, initial encounter: Secondary | ICD-10-CM

## 2015-01-03 DIAGNOSIS — J45909 Unspecified asthma, uncomplicated: Secondary | ICD-10-CM | POA: Insufficient documentation

## 2015-01-03 DIAGNOSIS — M329 Systemic lupus erythematosus, unspecified: Secondary | ICD-10-CM | POA: Insufficient documentation

## 2015-01-03 DIAGNOSIS — Z87440 Personal history of urinary (tract) infections: Secondary | ICD-10-CM | POA: Insufficient documentation

## 2015-01-03 DIAGNOSIS — Z8659 Personal history of other mental and behavioral disorders: Secondary | ICD-10-CM | POA: Insufficient documentation

## 2015-01-03 DIAGNOSIS — X58XXXA Exposure to other specified factors, initial encounter: Secondary | ICD-10-CM | POA: Insufficient documentation

## 2015-01-03 DIAGNOSIS — Z792 Long term (current) use of antibiotics: Secondary | ICD-10-CM | POA: Insufficient documentation

## 2015-01-03 DIAGNOSIS — Y998 Other external cause status: Secondary | ICD-10-CM | POA: Insufficient documentation

## 2015-01-03 DIAGNOSIS — Y9389 Activity, other specified: Secondary | ICD-10-CM | POA: Insufficient documentation

## 2015-01-03 DIAGNOSIS — Y9289 Other specified places as the place of occurrence of the external cause: Secondary | ICD-10-CM | POA: Insufficient documentation

## 2015-01-03 DIAGNOSIS — Z79899 Other long term (current) drug therapy: Secondary | ICD-10-CM | POA: Insufficient documentation

## 2015-01-03 MED ORDER — FAMOTIDINE 20 MG PO TABS
20.0000 mg | ORAL_TABLET | Freq: Once | ORAL | Status: AC
  Administered 2015-01-03: 20 mg via ORAL
  Filled 2015-01-03: qty 1

## 2015-01-03 MED ORDER — DIPHENHYDRAMINE HCL 25 MG PO CAPS
50.0000 mg | ORAL_CAPSULE | Freq: Once | ORAL | Status: AC
  Administered 2015-01-03: 50 mg via ORAL
  Filled 2015-01-03: qty 2

## 2015-01-03 MED ORDER — IBUPROFEN 800 MG PO TABS
800.0000 mg | ORAL_TABLET | Freq: Once | ORAL | Status: AC
  Administered 2015-01-03: 800 mg via ORAL
  Filled 2015-01-03: qty 1

## 2015-01-03 MED ORDER — DEXAMETHASONE SODIUM PHOSPHATE 10 MG/ML IJ SOLN
10.0000 mg | Freq: Once | INTRAMUSCULAR | Status: AC
  Administered 2015-01-03: 10 mg via INTRAMUSCULAR
  Filled 2015-01-03: qty 1

## 2015-01-03 MED ORDER — FAMOTIDINE 20 MG PO TABS: 20.0000 mg | ORAL_TABLET | Freq: Two times a day (BID) | ORAL | Status: DC

## 2015-01-03 NOTE — ED Notes (Signed)
Presents with red welt to right flank area-3 hours post bee sting-unknown if she has allergy to bees. SHe also c/o bilateral leg pain from Lupus.

## 2015-01-03 NOTE — Discharge Instructions (Signed)
1 to 2 tablets of 25 mg Benadryl pills every 4-6 hours as needed to a maximum of 300 mg per day. In addition, you may apply a topical hydrocortisone ointment to all affected areas except for the face.   Do not hesitate to call 911 or return to the emergency room if you develop any shortness of breath, wheezing, tongue or lip swelling.  Take acetaminophen (Tylenol) up to 975 mg (this is normally 3 over-the-counter pills) up to 3 times a day. Do not drink alcohol. Make sure your other medications do not contain acetaminophen (Read the labels!)  Please follow with your primary care doctor in the next 2 days for a check-up. They must obtain records for further management.   Do not hesitate to return to the Emergency Department for any new, worsening or concerning symptoms.   Bee, Wasp, or Hornet Sting Your caregiver has diagnosed you as having an insect sting. An insect sting appears as a red lump in the skin that sometimes has a tiny hole in the center, or it may have a stinger in the center of the wound. The most common stings are from wasps, hornets and bees. Individuals have different reactions to insect stings.  A normal reaction may cause pain, swelling, and redness around the sting site.  A localized allergic reaction may cause swelling and redness that extends beyond the sting site.  A large local reaction may continue to develop over the next 12 to 36 hours.  On occasion, the reactions can be severe (anaphylactic reaction). An anaphylactic reaction may cause wheezing; difficulty breathing; chest pain; fainting; raised, itchy, red patches on the skin; a sick feeling to your stomach (nausea); vomiting; cramping; or diarrhea. If you have had an anaphylactic reaction to an insect sting in the past, you are more likely to have one again. HOME CARE INSTRUCTIONS   With bee stings, a small sac of poison is left in the wound. Brushing across this with something such as a credit card, or anything  similar, will help remove this and decrease the amount of the reaction. This same procedure will not help a wasp sting as they do not leave behind a stinger and poison sac.  Apply a cold compress for 10 to 20 minutes every hour for 1 to 2 days, depending on severity, to reduce swelling and itching.  To lessen pain, a paste made of water and baking soda may be rubbed on the bite or sting and left on for 5 minutes.  To relieve itching and swelling, you may use take medication or apply medicated creams or lotions as directed.  Only take over-the-counter or prescription medicines for pain, discomfort, or fever as directed by your caregiver.  Wash the sting site daily with soap and water. Apply antibiotic ointment on the sting site as directed.  If you suffered a severe reaction:  If you did not require hospitalization, an adult will need to stay with you for 24 hours in case the symptoms return.  You may need to wear a medical bracelet or necklace stating the allergy.  You and your family need to learn when and how to use an anaphylaxis kit or epinephrine injection.  If you have had a severe reaction before, always carry your anaphylaxis kit with you. SEEK MEDICAL CARE IF:   None of the above helps within 2 to 3 days.  The area becomes red, warm, tender, and swollen beyond the area of the bite or sting.  You have an  oral temperature above 102 F (38.9 C). SEEK IMMEDIATE MEDICAL CARE IF:  You have symptoms of an allergic reaction which are:  Wheezing.  Difficulty breathing.  Chest pain.  Lightheadedness or fainting.  Itchy, raised, red patches on the skin.  Nausea, vomiting, cramping or diarrhea. ANY OF THESE SYMPTOMS MAY REPRESENT A SERIOUS PROBLEM THAT IS AN EMERGENCY. Do not wait to see if the symptoms will go away. Get medical help right away. Call your local emergency services (911 in [place] DO NOT drive yourself to the hospital. MAKE SURE YOU:   Understand these  instructions.  Will watch your condition.  Will get help right away if you are not doing well or get worse. Document Released: 07/10/2005 Document Revised: 10/02/2011 Document Reviewed: 12/25/2009 [facility] Patient Information 2015 [place], [place]. This information is not intended to replace advice given to you by your health care provider. Make sure you discuss any questions you have with your health care provider.

## 2015-01-03 NOTE — ED Provider Notes (Signed)
CSN: [id number]     Arrival date & time 01/03/15  1926 History   First MD Initiated Contact with Patient 01/03/15 1950     Chief Complaint  Patient presents with  . Lupus  . Insect Bite     (Consider location/radiation/quality/duration/timing/severity/associated sxs/prior Treatment) HPI  Blood pressure 152/80, pulse 97, temperature 98.3 F (36.8 C), temperature source Oral, resp. rate 20, height 5' 8.5" (1.74 m), weight 310 lb (140.615 kg), last menstrual period 11/21/2014, SpO2 99 %, not currently breastfeeding.  Hayley Webb is a 29 y.o. female complaining of bee sting to right flank area ago approximately 3 hours ago. States pain is severe, 8 out of 10, no exacerbating or alleviating factors identified no pain medication taken prior to arrival. Patient also reports less severe bilateral lower extremity pain which she thinks may be related to her lupus. Denies new medications, food, soap, lotions, laundry detergent, pets, N/V, dyspepsia, CP, SOB, wheeze, lip or tongue swelling, fever, h/o anaphylactic reaction. Last tetanus shot was within the last 5 years.  Past Medical History  Diagnosis Date  . 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 HSV infection   . Asthma   . Lupus     dx age 37  . Headache(784.0)   . Infection     UTI  . Anxiety     doing good now   Past Surgical History  Procedure Laterality Date  . Tonsillectomy and adenoidectomy  1998  . Mouth surgery      2 teeth removed- 1 wisdom and 1 in front of it  . Wisdom tooth extraction     Family History  Problem Relation Age of Onset  . Diabetes Maternal Aunt   . Cancer Maternal Grandmother 72    COLON CA  . Diabetes Maternal Grandmother   . Hypertension Maternal Grandfather   . Cancer Maternal Grandfather     prostate  . Asthma Mother   . Asthma Brother   . Anesthesia problems Neg Hx   . Hypotension Neg Hx   . Malignant hyperthermia Neg Hx   . Pseudochol deficiency Neg Hx   . Cancer  Paternal Grandmother     breast   History  Substance Use Topics  . Smoking status: Never Smoker   . Smokeless tobacco: Never Used  . Alcohol Use: No     Comment: not with preg   OB History    Gravida Para Term Preterm AB TAB SAB Ectopic Multiple Living   Review of Systems  10 systems reviewed and found to be negative, except as noted in the HPI.  Allergies  Bactrim; Prednisone; Other; Pineapple; Procardia; and Vicodin  Home Medications   Prior to Admission medications   Medication Sig Start Date End Date Taking? Authorizing Provider  albuterol (PROVENTIL) (2.5 MG/3ML) 0.083% nebulizer solution Take 3 mLs (2.5 mg total) by nebulization every 4 (four) hours as needed for wheezing or shortness of breath. 10/21/14   Geoffery Lyons, MD  aspirin-acetaminophen-caffeine (EXCEDRIN MIGRAINE) [phone] MG per tablet Take 2 tablets by mouth every 6 (six) hours as needed for headache or migraine.    Historical Provider, MD  doxycycline (VIBRAMYCIN) 100 MG capsule Take 1 capsule (100 mg total) by mouth 2 (two) times daily. 12/05/14   Rolland Porter, MD  famotidine (PEPCID) 20 MG tablet Take 1 tablet (20 mg total) by mouth 2 (two) times daily. 01/03/15  Tyrion Glaude, PA-C  Hydroxychloroquine Sulfate (PLAQUENIL PO) Take 100 mg by mouth daily.    Historical Provider, MD  methylPREDNISolone (MEDROL DOSEPAK) 4 MG tablet Take 3 tablets daily for the first 3 days, then 2 tablets daily for 3 days then 1 tablet daily thereafter. 10/21/14   Geoffery Lyons, MD   BP 152/80 mmHg  Pulse 97  Temp(Src) 98.3 F (36.8 C) (Oral)  Resp 20  Ht 5' 8.5" (1.74 m)  Wt 310 lb (140.615 kg)  BMI 46.44 kg/m2  SpO2 99%  LMP 11/21/2014 Physical Exam  Constitutional: She is oriented to person, place, and time. She appears well-developed and well-nourished. No distress.  HENT:  Head: Normocephalic and atraumatic.  Mouth/Throat: Oropharynx is clear and moist.  Eyes: Conjunctivae and EOM are normal.  Pupils are equal, round, and reactive to light.  Neck: Normal range of motion.  Cardiovascular: Normal rate, regular rhythm and intact distal pulses.   Pulmonary/Chest: Effort normal and breath sounds normal. No stridor. No respiratory distress. She has no wheezes. She has no rales. She exhibits no tenderness.  No stridor or drooling. No posterior pharynx edema, lip or tongue swelling. Pt reclining comfortably, speaking in complete sentences.   No Wheezing, excellent air movement in all fields.     Abdominal: Soft. There is no tenderness.  Musculoskeletal: Normal range of motion.  Neurological: She is alert and oriented to person, place, and time.  Skin: She is not diaphoretic.     Psychiatric: She has a normal mood and affect.  Nursing note and vitals reviewed.   ED Course  Procedures (including critical care time) Labs Review Labs Reviewed - No data to display  Imaging Review No results found.   EKG Interpretation None      MDM   Final diagnoses:  Bee sting, undetermined intent, initial encounter    Filed Vitals:   01/03/15 1937  BP: 152/80  Pulse: 97  Temp: 98.3 F (36.8 C)  TempSrc: Oral  Resp: 20  Height: 5' 8.5" (1.74 m)  Weight: 310 lb (140.615 kg)  SpO2: 99%    Medications  dexamethasone (DECADRON) injection 10 mg (10 mg Intramuscular Given 01/03/15 2010)  diphenhydrAMINE (BENADRYL) capsule 50 mg (50 mg Oral Given 01/03/15 2010)  famotidine (PEPCID) tablet 20 mg (20 mg Oral Given 01/03/15 2010)  ibuprofen (ADVIL,MOTRIN) tablet 800 mg (800 mg Oral Given 01/03/15 2010)    Hayley Webb is a pleasant 29 y.o. female presenting with bee sting to right lateral abdomen, no secondary organ involvement, no indication for epinephrine. Patient also reports an exacerbation of bilateral lower leg pain which is consistent with lupus. I've advised her to follow closely with her primary care on this. Patient has anaphylactic reaction to prednisone but has had Decadron in  the past without relief. Patient will given be given a allergy cocktail.   Evaluation does not show pathology that would require ongoing emergent intervention or inpatient treatment. Pt is hemodynamically stable and mentating appropriately. Discussed findings and plan with patient/guardian, who agrees with care plan. All questions answered. Return precautions discussed and outpatient follow up given.   New Prescriptions   FAMOTIDINE (PEPCID) 20 MG TABLET    Take 1 tablet (20 mg total) by mouth 2 (two) times daily.         Wynetta Emery, PA-C 01/03/15 2019  Nelva Nay, MD 01/03/15 2026

## 2015-03-05 ENCOUNTER — Emergency Department (HOSPITAL_BASED_OUTPATIENT_CLINIC_OR_DEPARTMENT_OTHER)
Admission: EM | Admit: 2015-03-05 | Discharge: 2015-03-05 | Disposition: A | Discharge status: Home / Self Care | Payer: Managed Care, Other (non HMO) | Attending: Emergency Medicine | Admitting: Emergency Medicine

## 2015-03-05 ENCOUNTER — Encounter (HOSPITAL_BASED_OUTPATIENT_CLINIC_OR_DEPARTMENT_OTHER): Payer: Self-pay | Admitting: *Deleted

## 2015-03-05 DIAGNOSIS — Z87440 Personal history of urinary (tract) infections: Secondary | ICD-10-CM | POA: Insufficient documentation

## 2015-03-05 DIAGNOSIS — A5901 Trichomonal vulvovaginitis: Secondary | ICD-10-CM | POA: Insufficient documentation

## 2015-03-05 DIAGNOSIS — A599 Trichomoniasis, unspecified: Secondary | ICD-10-CM

## 2015-03-05 DIAGNOSIS — Z792 Long term (current) use of antibiotics: Secondary | ICD-10-CM | POA: Insufficient documentation

## 2015-03-05 DIAGNOSIS — B9689 Other specified bacterial agents as the cause of diseases classified elsewhere: Secondary | ICD-10-CM

## 2015-03-05 DIAGNOSIS — Z3202 Encounter for pregnancy test, result negative: Secondary | ICD-10-CM | POA: Insufficient documentation

## 2015-03-05 DIAGNOSIS — J45909 Unspecified asthma, uncomplicated: Secondary | ICD-10-CM | POA: Insufficient documentation

## 2015-03-05 DIAGNOSIS — Z8659 Personal history of other mental and behavioral disorders: Secondary | ICD-10-CM | POA: Insufficient documentation

## 2015-03-05 DIAGNOSIS — Z8739 Personal history of other diseases of the musculoskeletal system and connective tissue: Secondary | ICD-10-CM | POA: Insufficient documentation

## 2015-03-05 DIAGNOSIS — N760 Acute vaginitis: Secondary | ICD-10-CM

## 2015-03-05 DIAGNOSIS — Z79899 Other long term (current) drug therapy: Secondary | ICD-10-CM | POA: Insufficient documentation

## 2015-03-05 LAB — URINALYSIS, ROUTINE W REFLEX MICROSCOPIC
BILIRUBIN URINE: NEGATIVE
Glucose, UA: NEGATIVE mg/dL
KETONES UR: NEGATIVE mg/dL
NITRITE: NEGATIVE
PROTEIN: NEGATIVE mg/dL
Specific Gravity, Urine: 1.029 (ref 1.005–1.030)
UROBILINOGEN UA: 1 mg/dL (ref 0.0–1.0)
pH: 6 (ref 5.0–8.0)

## 2015-03-05 LAB — URINE MICROSCOPIC-ADD ON

## 2015-03-05 LAB — WET PREP, GENITAL: Yeast Wet Prep HPF POC: NONE SEEN

## 2015-03-05 LAB — PREGNANCY, URINE: PREG TEST UR: NEGATIVE

## 2015-03-05 MED ORDER — LIDOCAINE HCL (PF) 1 % IJ SOLN
INTRAMUSCULAR | Status: AC
Start: 2015-03-05 — End: 2015-03-05
  Administered 2015-03-05: 2 mL
  Filled 2015-03-05: qty 5

## 2015-03-05 MED ORDER — METRONIDAZOLE 500 MG PO TABS
2000.0000 mg | ORAL_TABLET | Freq: Once | ORAL | Status: AC
  Administered 2015-03-05: 2000 mg via ORAL
  Filled 2015-03-05: qty 4

## 2015-03-05 MED ORDER — AZITHROMYCIN 250 MG PO TABS
1000.0000 mg | ORAL_TABLET | Freq: Once | ORAL | Status: AC
  Administered 2015-03-05: 1000 mg via ORAL
  Filled 2015-03-05: qty 4

## 2015-03-05 MED ORDER — ONDANSETRON 4 MG PO TBDP
4.0000 mg | ORAL_TABLET | Freq: Once | ORAL | Status: AC
Start: 2015-03-05 — End: 2015-03-05
  Administered 2015-03-05: 4 mg via ORAL
  Filled 2015-03-05: qty 1

## 2015-03-05 MED ORDER — METRONIDAZOLE 500 MG PO TABS: 500.0000 mg | ORAL_TABLET | Freq: Two times a day (BID) | ORAL | Status: DC

## 2015-03-05 MED ORDER — CEFTRIAXONE SODIUM 1 G IJ SOLR
1.0000 g | Freq: Once | INTRAMUSCULAR | Status: AC
  Administered 2015-03-05: 1 g via INTRAMUSCULAR
  Filled 2015-03-05: qty 10

## 2015-03-05 NOTE — ED Notes (Signed)
Back pain for 2 weeks

## 2015-03-05 NOTE — Discharge Instructions (Signed)
Trichomoniasis Trichomoniasis is an infection caused by an organism called Trichomonas. The infection can affect both women and men. In women, the outer female genitalia and the vagina are affected. In men, the penis is mainly affected, but the prostate and other reproductive organs can also be involved. Trichomoniasis is a sexually transmitted infection (STI) and is most often passed to another person through sexual contact.  RISK FACTORS  Having unprotected sexual intercourse.  Having sexual intercourse with an infected partner. SIGNS AND SYMPTOMS  Symptoms of trichomoniasis in women include:  Abnormal gray-green frothy vaginal discharge.  Itching and irritation of the vagina.  Itching and irritation of the area outside the vagina. Symptoms of trichomoniasis in men include:   Penile discharge with or without pain.  Pain during urination. This results from inflammation of the urethra. DIAGNOSIS  Trichomoniasis may be found during a Pap test or physical exam. Your health care provider may use one of the following methods to help diagnose this infection:  Examining vaginal discharge under a microscope. For men, urethral discharge would be examined.  Testing the pH of the vagina with a test tape.  Using a vaginal swab test that checks for the Trichomonas organism. A test is available that provides results within a few minutes.  Doing a culture test for the organism. This is not usually needed. TREATMENT   You may be given medicine to fight the infection. Women should inform their health care provider if they could be or are pregnant. Some medicines used to treat the infection should not be taken during pregnancy.  Your health care provider may recommend over-the-counter medicines or creams to decrease itching or irritation.  Your sexual partner will need to be treated if infected. HOME CARE INSTRUCTIONS   Take medicines only as directed by your health care provider.  Take  over-the-counter medicine for itching or irritation as directed by your health care provider.  Do not have sexual intercourse while you have the infection.  Women should not douche or wear tampons while they have the infection.  Discuss your infection with your partner. Your partner may have gotten the infection from you, or you may have gotten it from your partner.  Have your sex partner get examined and treated if necessary.  Practice safe, informed, and protected sex.  See your health care provider for other STI testing. SEEK MEDICAL CARE IF:   You still have symptoms after you finish your medicine.  You develop abdominal pain.  You have pain when you urinate.  You have bleeding after sexual intercourse.  You develop a rash.  Your medicine makes you sick or makes you throw up (vomit). MAKE SURE YOU:  Understand these instructions.  Will watch your condition.  Will get help right away if you are not doing well or get worse. Document Released: 01/03/2001 Document Revised: 11/24/2013 Document Reviewed: 04/21/2013 [employer] Patient Information 2015 [employer]. This information is not intended to replace advice given to you by your health care provider. Make sure you discuss any questions you have with your health care provider.   Bacterial Vaginosis Bacterial vaginosis is a vaginal infection that occurs when the normal balance of bacteria in the vagina is disrupted. It results from an overgrowth of certain bacteria. This is the most common vaginal infection in women of childbearing age. Treatment is important to prevent complications, especially in pregnant women, as it can cause a premature delivery. CAUSES  Bacterial vaginosis is caused by an increase in harmful bacteria that are   normally present in smaller amounts in the vagina. Several different kinds of bacteria can cause bacterial vaginosis. However, the reason that the condition develops is not fully  understood. RISK FACTORS Certain activities or behaviors can put you at an increased risk of developing bacterial vaginosis, including:  Having a new sex partner or multiple sex partners.  Douching.  Using an intrauterine device (IUD) for contraception. Women do not get bacterial vaginosis from toilet seats, bedding, swimming pools, or contact with objects around them. SIGNS AND SYMPTOMS  Some women with bacterial vaginosis have no signs or symptoms. Common symptoms include:  Grey vaginal discharge.  A fishlike odor with discharge, especially after sexual intercourse.  Itching or burning of the vagina and vulva.  Burning or pain with urination. DIAGNOSIS  Your health care provider will take a medical history and examine the vagina for signs of bacterial vaginosis. A sample of vaginal fluid may be taken. Your health care provider will look at this sample under a microscope to check for bacteria and abnormal cells. A vaginal pH test may also be done.  TREATMENT  Bacterial vaginosis may be treated with antibiotic medicines. These may be given in the form of a pill or a vaginal cream. A second round of antibiotics may be prescribed if the condition comes back after treatment.  HOME CARE INSTRUCTIONS   Only take over-the-counter or prescription medicines as directed by your health care provider.  If antibiotic medicine was prescribed, take it as directed. Make sure you finish it even if you start to feel better.  Do not have sex until treatment is completed.  Tell all sexual partners that you have a vaginal infection. They should see their health care provider and be treated if they have problems, such as a mild rash or itching.  Practice safe sex by using condoms and only having one sex partner. SEEK MEDICAL CARE IF:   Your symptoms are not improving after 3 days of treatment.  You have increased discharge or pain.  You have a fever. MAKE SURE YOU:   Understand these  instructions.  Will watch your condition.  Will get help right away if you are not doing well or get worse. FOR MORE INFORMATION  Centers for Disease Control and Prevention, Division of STD Prevention: www.cdc.gov/std American Sexual Health Association (ASHA): www.ashastd.org  Document Released: 07/10/2005 Document Revised: 04/30/2013 Document Reviewed: 02/19/2013 [employer] Patient Information 2015 [employer]. This information is not intended to replace advice given to you by your health care provider. Make sure you discuss any questions you have with your health care provider.  

## 2015-03-05 NOTE — ED Provider Notes (Signed)
CSN: [id number]     Arrival date & time 03/05/15  1622 History   First MD Initiated Contact with Patient 03/05/15 1725     Chief Complaint  Patient presents with  . Back Pain     (Consider location/radiation/quality/duration/timing/severity/associated sxs/prior Treatment) HPI Hayley Webb Is a 29 year old female who presents emergency Department with chief complaint of back pain. She has a past medical history of lupus, frequent urinary tract infections. The patient complains of lower back pain has been progressively worsening for the past 2 weeks. She describes bilateral flank pain, pain in her suprapubic region, she has associated dysuria and frequency. Patient also complaining of vaginal symptoms of foul odor and clear discharge. She had one episode of unprotected sexual intercourse with her partner. She denies fevers, chills, nausea, vomiting, colicky pain descriptive of ureteral colic.  Past Medical History  Diagnosis Date  . 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 HSV infection   . Asthma   . Lupus     dx age 78  . Headache(784.0)   . Infection     UTI  . Anxiety     doing good now   Past Surgical History  Procedure Laterality Date  . Tonsillectomy and adenoidectomy  1998  . Mouth surgery      2 teeth removed- 1 wisdom and 1 in front of it  . Wisdom tooth extraction     Family History  Problem Relation Age of Onset  . Diabetes Maternal Aunt   . Cancer Maternal Grandmother 72    COLON CA  . Diabetes Maternal Grandmother   . Hypertension Maternal Grandfather   . Cancer Maternal Grandfather     prostate  . Asthma Mother   . Asthma Brother   . Anesthesia problems Neg Hx   . Hypotension Neg Hx   . Malignant hyperthermia Neg Hx   . Pseudochol deficiency Neg Hx   . Cancer Paternal Grandmother     breast   Social History  Substance Use Topics  . Smoking status: Never Smoker   . Smokeless tobacco: Never Used  . Alcohol Use: No     Comment:  not with preg   OB History    Gravida Para Term Preterm AB TAB SAB Ectopic Multiple Living   Review of Systems  Ten systems reviewed and are negative for acute change, except as noted in the HPI.    Allergies  Bactrim; Prednisone; Other; Pineapple; Procardia; and Vicodin  Home Medications   Prior to Admission medications   Medication Sig Start Date End Date Taking? Authorizing Provider  albuterol (PROVENTIL) (2.5 MG/3ML) 0.083% nebulizer solution Take 3 mLs (2.5 mg total) by nebulization every 4 (four) hours as needed for wheezing or shortness of breath. 10/21/14   Geoffery Lyons, MD  aspirin-acetaminophen-caffeine (EXCEDRIN MIGRAINE) [phone] MG per tablet Take 2 tablets by mouth every 6 (six) hours as needed for headache or migraine.    Historical Provider, MD  doxycycline (VIBRAMYCIN) 100 MG capsule Take 1 capsule (100 mg total) by mouth 2 (two) times daily. 12/05/14   Rolland Porter, MD  famotidine (PEPCID) 20 MG tablet Take 1 tablet (20 mg total) by mouth 2 (two) times daily. 01/03/15   Nicole Pisciotta, PA-C  Hydroxychloroquine Sulfate (PLAQUENIL PO) Take 100 mg by mouth daily.    Historical Provider, MD  methylPREDNISolone (MEDROL DOSEPAK) 4 MG tablet Take 3 tablets daily for  the first 3 days, then 2 tablets daily for 3 days then 1 tablet daily thereafter. 10/21/14   Geoffery Lyons, MD   BP 143/94 mmHg  Pulse 92  Temp(Src) 97.8 F (36.6 C) (Oral)  Resp 16  Ht 5\' 9"  (1.753 m)  Wt 316 lb (143.337 kg)  BMI 46.64 kg/m2  SpO2 100%  LMP 02/14/2015 Physical Exam  Constitutional: She is oriented to person, place, and time. She appears well-developed and well-nourished. No distress.  HENT:  Head: Normocephalic and atraumatic.  Eyes: Conjunctivae are normal. No scleral icterus.  Neck: Normal range of motion.  Cardiovascular: Normal rate, regular rhythm and normal heart sounds.  Exam reveals no gallop and no friction rub.   No murmur heard. Pulmonary/Chest: Effort  normal and breath sounds normal. No respiratory distress.  No CVA tenderness   Abdominal: Soft. Bowel sounds are normal. She exhibits no distension and no mass. There is no tenderness. There is no guarding.  Genitourinary:  Pelvic exam: normal external genitalia, vulva, vagina, cervix, uterus and adnexa. Mild diffuse tenderness noted.   Neurological: She is alert and oriented to person, place, and time.  Skin: Skin is warm and dry. She is not diaphoretic.  Nursing note and vitals reviewed.   ED Course  Procedures (including critical care time) Labs Review Labs Reviewed  URINALYSIS, ROUTINE W REFLEX MICROSCOPIC (NOT AT [facility]) - Abnormal; Notable for the following:    APPearance CLOUDY (*)    Hgb urine dipstick TRACE (*)    Leukocytes, UA MODERATE (*)    All other components within normal limits  URINE MICROSCOPIC-ADD ON - Abnormal; Notable for the following:    Squamous Epithelial / LPF FEW (*)    Bacteria, UA FEW (*)    All other components within normal limits  URINE CULTURE  WET PREP, GENITAL  PREGNANCY, URINE  HIV ANTIBODY (ROUTINE TESTING)  RPR  GC/CHLAMYDIA PROBE AMP ([place]) NOT AT [facility]    Imaging Review No results found. Lucila [place], personally reviewed and evaluated these images and lab results as part of my medical decision-making.   EKG Interpretation None      MDM   Final diagnoses:  Trichomonal infection  BV (bacterial vaginosis)    Patient with positive trichomoniasis and bacterial vaginosis. Patient treated prophylactically with azithromycin and Rocephin.  GC chlamydia pending. She is safe for discharge at this time  Arthor Captain, PA-C 03/08/15 1033  Doug Sou, MD 03/08/15 [phone]

## 2015-03-06 LAB — RPR: RPR Ser Ql: NONREACTIVE

## 2015-03-06 LAB — HIV ANTIBODY (ROUTINE TESTING W REFLEX): HIV SCREEN 4TH GENERATION: NONREACTIVE

## 2015-03-07 LAB — URINE CULTURE
CULTURE: NO GROWTH
Special Requests: NORMAL

## 2015-03-08 LAB — GC/CHLAMYDIA PROBE AMP (~~LOC~~) NOT AT ARMC
CHLAMYDIA, DNA PROBE: NEGATIVE
NEISSERIA GONORRHEA: NEGATIVE

## 2015-03-12 ENCOUNTER — Encounter (HOSPITAL_BASED_OUTPATIENT_CLINIC_OR_DEPARTMENT_OTHER): Payer: Self-pay | Admitting: *Deleted

## 2015-03-12 ENCOUNTER — Emergency Department (HOSPITAL_BASED_OUTPATIENT_CLINIC_OR_DEPARTMENT_OTHER)
Admission: EM | Admit: 2015-03-12 | Discharge: 2015-03-12 | Disposition: A | Discharge status: Home / Self Care | Payer: Managed Care, Other (non HMO) | Attending: Emergency Medicine | Admitting: Emergency Medicine

## 2015-03-12 DIAGNOSIS — Z8659 Personal history of other mental and behavioral disorders: Secondary | ICD-10-CM | POA: Insufficient documentation

## 2015-03-12 DIAGNOSIS — Z8739 Personal history of other diseases of the musculoskeletal system and connective tissue: Secondary | ICD-10-CM | POA: Insufficient documentation

## 2015-03-12 DIAGNOSIS — J45909 Unspecified asthma, uncomplicated: Secondary | ICD-10-CM | POA: Insufficient documentation

## 2015-03-12 DIAGNOSIS — L293 Anogenital pruritus, unspecified: Secondary | ICD-10-CM | POA: Insufficient documentation

## 2015-03-12 DIAGNOSIS — Z8619 Personal history of other infectious and parasitic diseases: Secondary | ICD-10-CM | POA: Insufficient documentation

## 2015-03-12 DIAGNOSIS — Z87440 Personal history of urinary (tract) infections: Secondary | ICD-10-CM | POA: Insufficient documentation

## 2015-03-12 DIAGNOSIS — N898 Other specified noninflammatory disorders of vagina: Secondary | ICD-10-CM | POA: Insufficient documentation

## 2015-03-12 DIAGNOSIS — Z79899 Other long term (current) drug therapy: Secondary | ICD-10-CM | POA: Insufficient documentation

## 2015-03-12 MED ORDER — FLUCONAZOLE 150 MG PO TABS: 150.0000 mg | ORAL_TABLET | Freq: Once | ORAL | Status: DC

## 2015-03-12 NOTE — Discharge Instructions (Signed)
Take the prescribed medication as directed. °Return to the ED for new or worsening symptoms. ° °

## 2015-03-12 NOTE — ED Provider Notes (Signed)
CSN: [id number]     Arrival date & time 03/12/15  1831 History   First MD Initiated Contact with Patient 03/12/15 1838     Chief Complaint  Patient presents with  . Vaginal Discharge     (Consider location/radiation/quality/duration/timing/severity/associated sxs/prior Treatment) The history is provided by the patient and medical records.   This is a 29 year old female with history of herpes, asthma, lupus, headaches, anxiety, presenting to the ED for vaginal discharge and irritation. Patient was seen in the ED last week for STD screening was diagnosed with trichomonas. She was started on Flagyl for trichomonas and was also treated for gonorrhea and chlamydia with Rocephin and azithromycin in the ED. She states since this time she has developed vaginal itching and irritation with "cottage cheeselike" vaginal discharge. Patient states she has had a yeast infection in the past with similar symptoms. She denies any new sexual encounter since her visit last week. She does not have any concern for STD at this time. No concern for pregnancy. No abdominal pain, pelvic pain, nausea, vomiting, or diarrhea. No intervention tried prior to arrival.  Past Medical History  Diagnosis Date  . 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 HSV infection   . Asthma   . Lupus     dx age 37  . Headache(784.0)   . Infection     UTI  . Anxiety     doing good now   Past Surgical History  Procedure Laterality Date  . Tonsillectomy and adenoidectomy  1998  . Mouth surgery      2 teeth removed- 1 wisdom and 1 in front of it  . Wisdom tooth extraction     Family History  Problem Relation Age of Onset  . Diabetes Maternal Aunt   . Cancer Maternal Grandmother 72    COLON CA  . Diabetes Maternal Grandmother   . Hypertension Maternal Grandfather   . Cancer Maternal Grandfather     prostate  . Asthma Mother   . Asthma Brother   . Anesthesia problems Neg Hx   . Hypotension Neg Hx   .  Malignant hyperthermia Neg Hx   . Pseudochol deficiency Neg Hx   . Cancer Paternal Grandmother     breast   Social History  Substance Use Topics  . Smoking status: Never Smoker   . Smokeless tobacco: Never Used  . Alcohol Use: No     Comment: not with preg   OB History    Gravida Para Term Preterm AB TAB SAB Ectopic Multiple Living   3 1 1  1  1   1      Review of Systems  Genitourinary: Positive for vaginal discharge and vaginal pain (itching).  All other systems reviewed and are negative.     Allergies  Bactrim; Prednisone; Other; Pineapple; Procardia; and Vicodin  Home Medications   Prior to Admission medications   Medication Sig Start Date End Date Taking? Authorizing Provider  aspirin-acetaminophen-caffeine (EXCEDRIN MIGRAINE) [phone] MG per tablet Take 2 tablets by mouth every 6 (six) hours as needed for headache or migraine.    Historical Provider, MD  Hydroxychloroquine Sulfate (PLAQUENIL PO) Take 100 mg by mouth daily.    Historical Provider, MD   BP 127/89 mmHg  Pulse 84  Temp(Src) 97.8 F (36.6 C)  Resp 18  Ht 5' 8.5" (1.74 m)  Wt 315 lb (142.883 kg)  BMI 47.19 kg/m2  SpO2 100%  LMP 02/14/2015   Physical Exam  Constitutional: She is oriented to person, place, and time. She appears well-developed and well-nourished.  HENT:  Head: Normocephalic and atraumatic.  Mouth/Throat: Oropharynx is clear and moist.  Eyes: Conjunctivae and EOM are normal. Pupils are equal, round, and reactive to light.  Neck: Normal range of motion.  Cardiovascular: Normal rate, regular rhythm and normal heart sounds.   Pulmonary/Chest: Effort normal and breath sounds normal.  Abdominal: Soft. Bowel sounds are normal.  Musculoskeletal: Normal range of motion.  Neurological: She is alert and oriented to person, place, and time.  Skin: Skin is warm and dry.  Psychiatric: She has a normal mood and affect.  Nursing note and vitals reviewed.   ED Course  Procedures (including  critical care time) Labs Review Labs Reviewed - No data to display  Imaging Review No results found. I have personally reviewed and evaluated these images and lab results as part of my medical decision-making.   EKG Interpretation None      MDM   Final diagnoses:  Vaginal itching  Vaginal discharge   29 year old female with vaginal itching and cottage cheese vaginal discharge. She was treated with antibiotics a week ago for STDs.  I have reviewed her results, she tested positive for trichomonas but coronary chlamydia was negative. She was treated appropriately with Flagyl. Patient is not had any new sexual encounter since this time and does not have concern for STD currently. Feel it is reasonable to forego repeat pelvic exam at this time-- patient in agreement with this. Patient be treated presumptively with Diflucan.  Discussed plan with patient, he/she acknowledged understanding and agreed with plan of care.  Return precautions given for new or worsening symptoms.  Garlon Hatchet, PA-C 03/12/15 1856  Rolan Bucco, MD 03/12/15 [phone]

## 2015-03-12 NOTE — ED Notes (Signed)
Dx with STD last week, states that now she has a yeast infection.

## 2015-04-28 ENCOUNTER — Emergency Department (HOSPITAL_BASED_OUTPATIENT_CLINIC_OR_DEPARTMENT_OTHER)
Admission: EM | Admit: 2015-04-28 | Discharge: 2015-04-28 | Disposition: A | Discharge status: Home / Self Care | Payer: Managed Care, Other (non HMO) | Attending: Emergency Medicine | Admitting: Emergency Medicine

## 2015-04-28 ENCOUNTER — Encounter (HOSPITAL_BASED_OUTPATIENT_CLINIC_OR_DEPARTMENT_OTHER): Payer: Self-pay

## 2015-04-28 DIAGNOSIS — Z8659 Personal history of other mental and behavioral disorders: Secondary | ICD-10-CM | POA: Insufficient documentation

## 2015-04-28 DIAGNOSIS — Z8619 Personal history of other infectious and parasitic diseases: Secondary | ICD-10-CM | POA: Insufficient documentation

## 2015-04-28 DIAGNOSIS — N3000 Acute cystitis without hematuria: Secondary | ICD-10-CM | POA: Insufficient documentation

## 2015-04-28 DIAGNOSIS — Z8739 Personal history of other diseases of the musculoskeletal system and connective tissue: Secondary | ICD-10-CM | POA: Insufficient documentation

## 2015-04-28 DIAGNOSIS — Z79899 Other long term (current) drug therapy: Secondary | ICD-10-CM | POA: Insufficient documentation

## 2015-04-28 DIAGNOSIS — N72 Inflammatory disease of cervix uteri: Secondary | ICD-10-CM | POA: Insufficient documentation

## 2015-04-28 DIAGNOSIS — Z3202 Encounter for pregnancy test, result negative: Secondary | ICD-10-CM | POA: Insufficient documentation

## 2015-04-28 DIAGNOSIS — J45909 Unspecified asthma, uncomplicated: Secondary | ICD-10-CM | POA: Insufficient documentation

## 2015-04-28 LAB — URINALYSIS, ROUTINE W REFLEX MICROSCOPIC
BILIRUBIN URINE: NEGATIVE
Glucose, UA: NEGATIVE mg/dL
KETONES UR: NEGATIVE mg/dL
NITRITE: NEGATIVE
Protein, ur: NEGATIVE mg/dL
SPECIFIC GRAVITY, URINE: 1.014 (ref 1.005–1.030)
UROBILINOGEN UA: 1 mg/dL (ref 0.0–1.0)
pH: 6.5 (ref 5.0–8.0)

## 2015-04-28 LAB — WET PREP, GENITAL: YEAST WET PREP: NONE SEEN

## 2015-04-28 LAB — URINE MICROSCOPIC-ADD ON

## 2015-04-28 LAB — PREGNANCY, URINE: PREG TEST UR: NEGATIVE

## 2015-04-28 MED ORDER — AZITHROMYCIN 250 MG PO TABS
1000.0000 mg | ORAL_TABLET | Freq: Every day | ORAL | Status: DC
  Administered 2015-04-28: 1000 mg via ORAL
  Filled 2015-04-28: qty 4

## 2015-04-28 MED ORDER — CEPHALEXIN 500 MG PO CAPS: 500.0000 mg | ORAL_CAPSULE | Freq: Three times a day (TID) | ORAL | Status: DC

## 2015-04-28 MED ORDER — LIDOCAINE HCL (PF) 1 % IJ SOLN
INTRAMUSCULAR | Status: AC
  Administered 2015-04-28: 20:00:00
  Filled 2015-04-28: qty 5

## 2015-04-28 MED ORDER — CEFTRIAXONE SODIUM 250 MG IJ SOLR
250.0000 mg | Freq: Once | INTRAMUSCULAR | Status: AC
  Administered 2015-04-28: 250 mg via INTRAMUSCULAR
  Filled 2015-04-28: qty 250

## 2015-04-28 MED ORDER — METRONIDAZOLE 0.75 % VA GEL: 1.0000 | Freq: Two times a day (BID) | VAGINAL | Status: DC

## 2015-04-28 MED ORDER — METRONIDAZOLE 500 MG PO TABS
2000.0000 mg | ORAL_TABLET | Freq: Once | ORAL | Status: AC
  Administered 2015-04-28: 2000 mg via ORAL
  Filled 2015-04-28: qty 4

## 2015-04-28 NOTE — Discharge Instructions (Signed)
Cervicitis °Cervicitis is a soreness and swelling (inflammation) of the cervix. Your cervix is located at the bottom of your uterus. It opens up to the vagina. °CAUSES  °· Sexually transmitted infections (STIs).   °· Allergic reaction.   °· Medicines or birth control devices that are put in the vagina.   °· Injury to the cervix.   °· Bacterial infections.   °RISK FACTORS °You are at greater risk if you: °· Have unprotected sexual intercourse. °· Have sexual intercourse with many partners. °· Began sexual intercourse at an early age. °· Have a history of STIs. °SYMPTOMS  °There may be no symptoms. If symptoms occur, they may include:  °· Gray, white, yellow, or bad-smelling vaginal discharge.   °· Pain or itching of the area outside the vagina.   °· Painful sexual intercourse.   °· Lower abdominal or lower back pain, especially during intercourse.   °· Frequent urination.   °· Abnormal vaginal bleeding between periods, after sexual intercourse, or after menopause.   °· Pressure or a heavy feeling in the pelvis.   °DIAGNOSIS  °Diagnosis is made after a pelvic exam. Other tests may include:  °· Examination of any discharge under a microscope (wet prep).   °· A Pap test.   °TREATMENT  °Treatment will depend on the cause of cervicitis. If it is caused by an STI, both you and your partner will need to be treated. Antibiotic medicines will be given.  °HOME CARE INSTRUCTIONS  °· Do not have sexual intercourse until your health care provider says it is okay.   °· Do not have sexual intercourse until your partner has been treated, if your cervicitis is caused by an STI.   °· Take your antibiotics as directed. Finish them even if you start to feel better.   °SEEK MEDICAL CARE IF: °· Your symptoms come back.   °· You have a fever.   °MAKE SURE YOU:  °· Understand these instructions. °· Will watch your condition. °· Will get help right away if you are not doing well or get worse. °  °This information is not intended to replace  advice given to you by your health care provider. Make sure you discuss any questions you have with your health care provider. °  °Document Released: 07/10/2005 Document Revised: 07/15/2013 Document Reviewed: 01/01/2013 °Elsevier Interactive Patient Education ©2016 [employer] ° °

## 2015-04-28 NOTE — ED Notes (Signed)
MD at bedside. 

## 2015-04-28 NOTE — ED Notes (Signed)
Pt c/o lower abdominal pain for the last two weeks with odorous, clear vaginal discharge and urinary frequency.
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
## 2015-04-29 LAB — GC/CHLAMYDIA PROBE AMP (~~LOC~~) NOT AT ARMC
Chlamydia: NEGATIVE
Neisseria Gonorrhea: NEGATIVE

## 2015-07-27 ENCOUNTER — Ambulatory Visit (HOSPITAL_COMMUNITY)
Admission: EM | Admit: 2015-07-27 | Discharge: 2015-07-27 | Disposition: A | Discharge status: Home / Self Care | Payer: No Typology Code available for payment source | Source: Ambulatory Visit | Attending: Emergency Medicine | Admitting: Emergency Medicine

## 2015-07-27 ENCOUNTER — Encounter (HOSPITAL_BASED_OUTPATIENT_CLINIC_OR_DEPARTMENT_OTHER): Payer: Self-pay | Admitting: *Deleted

## 2015-07-27 ENCOUNTER — Emergency Department (HOSPITAL_BASED_OUTPATIENT_CLINIC_OR_DEPARTMENT_OTHER)
Admission: EM | Admit: 2015-07-27 | Discharge: 2015-07-27 | Disposition: A | Discharge status: Home / Self Care | Payer: Managed Care, Other (non HMO) | Attending: Emergency Medicine | Admitting: Emergency Medicine

## 2015-07-27 DIAGNOSIS — Y9289 Other specified places as the place of occurrence of the external cause: Secondary | ICD-10-CM | POA: Insufficient documentation

## 2015-07-27 DIAGNOSIS — Z0441 Encounter for examination and observation following alleged adult rape: Secondary | ICD-10-CM | POA: Insufficient documentation

## 2015-07-27 DIAGNOSIS — T7421XA Adult sexual abuse, confirmed, initial encounter: Secondary | ICD-10-CM | POA: Insufficient documentation

## 2015-07-27 DIAGNOSIS — Y998 Other external cause status: Secondary | ICD-10-CM | POA: Insufficient documentation

## 2015-07-27 DIAGNOSIS — Z79899 Other long term (current) drug therapy: Secondary | ICD-10-CM | POA: Insufficient documentation

## 2015-07-27 DIAGNOSIS — Z8619 Personal history of other infectious and parasitic diseases: Secondary | ICD-10-CM | POA: Insufficient documentation

## 2015-07-27 DIAGNOSIS — Y9389 Activity, other specified: Secondary | ICD-10-CM | POA: Insufficient documentation

## 2015-07-27 DIAGNOSIS — J45909 Unspecified asthma, uncomplicated: Secondary | ICD-10-CM | POA: Insufficient documentation

## 2015-07-27 DIAGNOSIS — Z87440 Personal history of urinary (tract) infections: Secondary | ICD-10-CM | POA: Insufficient documentation

## 2015-07-27 DIAGNOSIS — Z7951 Long term (current) use of inhaled steroids: Secondary | ICD-10-CM | POA: Insufficient documentation

## 2015-07-27 DIAGNOSIS — Z3202 Encounter for pregnancy test, result negative: Secondary | ICD-10-CM | POA: Insufficient documentation

## 2015-07-27 DIAGNOSIS — Z8739 Personal history of other diseases of the musculoskeletal system and connective tissue: Secondary | ICD-10-CM | POA: Insufficient documentation

## 2015-07-27 DIAGNOSIS — Z792 Long term (current) use of antibiotics: Secondary | ICD-10-CM | POA: Insufficient documentation

## 2015-07-27 DIAGNOSIS — Z8659 Personal history of other mental and behavioral disorders: Secondary | ICD-10-CM | POA: Insufficient documentation

## 2015-07-27 MED ORDER — ULIPRISTAL ACETATE 30 MG PO TABS
30.0000 mg | ORAL_TABLET | Freq: Once | ORAL | Status: AC
  Administered 2015-07-27: 30 mg via ORAL
  Filled 2015-07-27: qty 1

## 2015-07-27 MED ORDER — AZITHROMYCIN 1 G PO PACK
1.0000 g | PACK | Freq: Once | ORAL | Status: AC
  Administered 2015-07-27: 1 g via ORAL

## 2015-07-27 MED ORDER — PROMETHAZINE HCL 25 MG PO TABS
25.0000 mg | ORAL_TABLET | Freq: Four times a day (QID) | ORAL | Status: DC | PRN
  Administered 2015-07-27: 25 mg via ORAL

## 2015-07-27 MED ORDER — IBUPROFEN 800 MG PO TABS
800.0000 mg | ORAL_TABLET | Freq: Once | ORAL | Status: AC
  Administered 2015-07-27: 800 mg via ORAL
  Filled 2015-07-27: qty 1

## 2015-07-27 MED ORDER — METRONIDAZOLE 500 MG PO TABS
2000.0000 mg | ORAL_TABLET | Freq: Once | ORAL | Status: AC
  Administered 2015-07-27: 2000 mg via ORAL

## 2015-07-27 MED ORDER — CEFIXIME 400 MG PO TABS
400.0000 mg | ORAL_TABLET | Freq: Once | ORAL | Status: AC
  Administered 2015-07-27: 400 mg via ORAL

## 2015-07-27 NOTE — ED Notes (Signed)
Called patient in waiting room and no answer.  Unable to obtain vital signs at this time
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
## 2015-07-27 NOTE — ED Notes (Signed)
Call the Sane nurse, she on the [address] on her way here.

## 2015-07-27 NOTE — ED Notes (Signed)
Pt taken to SANE room 

## 2015-07-27 NOTE — ED Notes (Signed)
Patient was placed in conference room for privacy.

## 2015-07-27 NOTE — ED Notes (Signed)
This pt is sitting in the conference room. Waiting for EDP evaluation and SANE nurse arrival.
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
## 2015-07-28 LAB — POC URINE PREG, ED: Preg Test, Ur: NEGATIVE

## 2015-09-14 ENCOUNTER — Emergency Department (HOSPITAL_BASED_OUTPATIENT_CLINIC_OR_DEPARTMENT_OTHER): Payer: Self-pay

## 2015-09-14 ENCOUNTER — Emergency Department (HOSPITAL_BASED_OUTPATIENT_CLINIC_OR_DEPARTMENT_OTHER)
Admission: EM | Admit: 2015-09-14 | Discharge: 2015-09-14 | Disposition: A | Discharge status: Home / Self Care | Payer: Self-pay | Attending: Emergency Medicine | Admitting: Emergency Medicine

## 2015-09-14 ENCOUNTER — Encounter (HOSPITAL_BASED_OUTPATIENT_CLINIC_OR_DEPARTMENT_OTHER): Payer: Self-pay | Admitting: Emergency Medicine

## 2015-09-14 DIAGNOSIS — Z7952 Long term (current) use of systemic steroids: Secondary | ICD-10-CM | POA: Insufficient documentation

## 2015-09-14 DIAGNOSIS — Z8619 Personal history of other infectious and parasitic diseases: Secondary | ICD-10-CM | POA: Insufficient documentation

## 2015-09-14 DIAGNOSIS — R000 Tachycardia, unspecified: Secondary | ICD-10-CM | POA: Insufficient documentation

## 2015-09-14 DIAGNOSIS — Z8739 Personal history of other diseases of the musculoskeletal system and connective tissue: Secondary | ICD-10-CM | POA: Insufficient documentation

## 2015-09-14 DIAGNOSIS — J111 Influenza due to unidentified influenza virus with other respiratory manifestations: Secondary | ICD-10-CM | POA: Insufficient documentation

## 2015-09-14 DIAGNOSIS — Z8659 Personal history of other mental and behavioral disorders: Secondary | ICD-10-CM | POA: Insufficient documentation

## 2015-09-14 DIAGNOSIS — Z79899 Other long term (current) drug therapy: Secondary | ICD-10-CM | POA: Insufficient documentation

## 2015-09-14 DIAGNOSIS — J45901 Unspecified asthma with (acute) exacerbation: Secondary | ICD-10-CM | POA: Insufficient documentation

## 2015-09-14 DIAGNOSIS — Z792 Long term (current) use of antibiotics: Secondary | ICD-10-CM | POA: Insufficient documentation

## 2015-09-14 DIAGNOSIS — Z87440 Personal history of urinary (tract) infections: Secondary | ICD-10-CM | POA: Insufficient documentation

## 2015-09-14 MED ORDER — ALBUTEROL SULFATE HFA 108 (90 BASE) MCG/ACT IN AERS
1.0000 | INHALATION_SPRAY | Freq: Four times a day (QID) | RESPIRATORY_TRACT | Status: DC | PRN
  Administered 2015-09-14: 2 via RESPIRATORY_TRACT
  Filled 2015-09-14: qty 6.7

## 2015-09-14 MED ORDER — OSELTAMIVIR PHOSPHATE 75 MG PO CAPS: 75.0000 mg | ORAL_CAPSULE | Freq: Two times a day (BID) | ORAL | Status: DC

## 2015-09-14 MED ORDER — DEXAMETHASONE 6 MG PO TABS: 6.0000 mg | ORAL_TABLET | Freq: Once | ORAL | Status: DC

## 2015-09-14 MED ORDER — ALBUTEROL SULFATE (2.5 MG/3ML) 0.083% IN NEBU
2.5000 mg | INHALATION_SOLUTION | Freq: Once | RESPIRATORY_TRACT | Status: AC
  Administered 2015-09-14: 2.5 mg via RESPIRATORY_TRACT
  Filled 2015-09-14: qty 3

## 2015-09-14 MED ORDER — IPRATROPIUM-ALBUTEROL 0.5-2.5 (3) MG/3ML IN SOLN
3.0000 mL | Freq: Once | RESPIRATORY_TRACT | Status: AC
  Administered 2015-09-14: 3 mL via RESPIRATORY_TRACT
  Filled 2015-09-14: qty 3

## 2015-09-14 MED ORDER — DEXAMETHASONE 6 MG PO TABS
10.0000 mg | ORAL_TABLET | Freq: Once | ORAL | Status: AC
  Administered 2015-09-14: 10 mg via ORAL
  Filled 2015-09-14: qty 1

## 2015-09-14 NOTE — ED Notes (Signed)
Patient transported to X-ray 

## 2015-09-14 NOTE — Discharge Instructions (Signed)
Asthma, Adult Asthma is a condition of the lungs in which the airways tighten and narrow. Asthma can make it hard to breathe. Asthma cannot be cured, but medicine and lifestyle changes can help control it. Asthma may be started (triggered) by:  Animal skin flakes (dander).  Dust.  Cockroaches.  Pollen.  Mold.  Smoke.  Cleaning products.  Hair sprays or aerosol sprays.  Paint fumes or strong smells.  Cold air, weather changes, and winds.  Crying or laughing hard.  Stress.  Certain medicines or drugs.  Foods, such as dried fruit, potato chips, and sparkling grape juice.  Infections or conditions (colds, flu).  Exercise.  Certain medical conditions or diseases.  Exercise or tiring activities. HOME CARE   Take medicine as told by your doctor.  Use a peak flow meter as told by your doctor. A peak flow meter is a tool that measures how well the lungs are working.  Record and keep track of the peak flow meter's readings.  Understand and use the asthma action plan. An asthma action plan is a written plan for taking care of your asthma and treating your attacks.  To help prevent asthma attacks:  Do not smoke. Stay away from secondhand smoke.  Change your heating and air conditioning filter often.  Limit your use of fireplaces and wood stoves.  Get rid of pests (such as roaches and mice) and their droppings.  Throw away plants if you see mold on them.  Clean your floors. Dust regularly. Use cleaning products that do not smell.  Have someone vacuum when you are not home. Use a vacuum cleaner with a HEPA filter if possible.  Replace carpet with wood, tile, or vinyl flooring. Carpet can trap animal skin flakes and dust.  Use allergy-proof pillows, mattress covers, and box spring covers.  Wash bed sheets and blankets every week in hot water and dry them in a dryer.  Use blankets that are made of polyester or cotton.  Clean bathrooms and kitchens with bleach.  If possible, have someone repaint the walls in these rooms with mold-resistant paint. Keep out of the rooms that are being cleaned and painted.  Wash hands often. GET HELP IF:  You have make a whistling sound when breaking (wheeze), have shortness of breath, or have a cough even if taking medicine to prevent attacks.  The colored mucus you cough up (sputum) is thicker than usual.  The colored mucus you cough up changes from clear or white to yellow, green, gray, or bloody.  You have problems from the medicine you are taking such as:  A rash.  Itching.  Swelling.  Trouble breathing.  You need reliever medicines more than 2-3 times a week.  Your peak flow measurement is still at 50-79% of your personal best after following the action plan for 1 hour.  You have a fever. GET HELP RIGHT AWAY IF:   You seem to be worse and are not responding to medicine during an asthma attack.  You are short of breath even at rest.  You get short of breath when doing very little activity.  You have trouble eating, drinking, or talking.  You have chest pain.  You have a fast heartbeat.  Your lips or fingernails start to turn blue.  You are light-headed, dizzy, or faint.  Your peak flow is less than 50% of your personal best.   This information is not intended to replace advice given to you by your health care provider. Make sure   you discuss any questions you have with your health care provider.   Document Released: 12/27/2007 Document Revised: 03/31/2015 Document Reviewed: 02/06/2013 Elsevier Interactive Patient Education 2016 [employer]  

## 2015-09-14 NOTE — ED Provider Notes (Signed)
CSN: [id number]     Arrival date & time 09/14/15  1933 History  By signing my name below, I, Marisue Humble, attest that this documentation has been prepared under the direction and in the presence of Gwyneth Sprout, MD . Electronically Signed: Marisue Humble, Scribe. 09/14/2015. 8:50 PM.   Chief Complaint  Patient presents with  . Shortness of Breath   The history is provided by the patient. No language interpreter was used.   HPI Comments:  Hayley Webb is a 30 y.o. female with PMHx of asthma who presents to the Emergency Department complaining of shortness of breath and chills onset last night. Pt reports associated fever, tmax 102, yesterday; fever is currently alleviated. She also reports sharp left side chest pain, worse with movement or when lying on her left side onset ~1200 today. Pt notes productive cough worsened with deep breath, mild headache, and sore throat. Pt has been using nebulizer every 30 minutes at home with mild temporary relief.  She reports sick contacts at work. Pt denies vomiting.  Past Medical History  Diagnosis Date  . 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 HSV infection   . Asthma   . Lupus ([facility])     dx age 40  . Headache(784.0)   . Infection     UTI  . Anxiety     doing good now   Past Surgical History  Procedure Laterality Date  . Tonsillectomy and adenoidectomy  1998  . Mouth surgery      2 teeth removed- 1 wisdom and 1 in front of it  . Wisdom tooth extraction     Family History  Problem Relation Age of Onset  . Diabetes Maternal Aunt   . Cancer Maternal Grandmother 72    COLON CA  . Diabetes Maternal Grandmother   . Hypertension Maternal Grandfather   . Cancer Maternal Grandfather     prostate  . Asthma Mother   . Asthma Brother   . Anesthesia problems Neg Hx   . Hypotension Neg Hx   . Malignant hyperthermia Neg Hx   . Pseudochol deficiency Neg Hx   . Cancer Paternal Grandmother     breast   Social  History  Substance Use Topics  . Smoking status: Never Smoker   . Smokeless tobacco: Never Used  . Alcohol Use: No     Comment: not with preg   OB History    Gravida Para Term Preterm AB TAB SAB Ectopic Multiple Living   3 1 1  1  1   1      Review of Systems  Constitutional: Positive for fever and chills.  HENT: Positive for sore throat.   Respiratory: Positive for cough and shortness of breath.   Cardiovascular: Positive for chest pain.  Gastrointestinal: Negative for vomiting.  Neurological: Positive for headaches.  All other systems reviewed and are negative.  Allergies  Bactrim; Prednisone; Other; Pineapple; Procardia; and Vicodin  Home Medications   Prior to Admission medications   Medication Sig Start Date End Date Taking? Authorizing Provider  albuterol (PROVENTIL HFA;VENTOLIN HFA) 108 (90 Base) MCG/ACT inhaler Inhale 2 puffs into the lungs every 6 (six) hours as needed for wheezing or shortness of breath.   Yes Historical Provider, MD  albuterol (PROVENTIL) (2.5 MG/3ML) 0.083% nebulizer solution Take 2.5 mg by nebulization every 6 (six) hours as needed for wheezing or shortness of breath.   Yes Historical Provider, MD  methylPREDNISolone (MEDROL) 2 MG tablet Take 2  mg by mouth daily.   Yes Historical Provider, MD  aspirin-acetaminophen-caffeine (EXCEDRIN MIGRAINE) [phone] MG per tablet Take 2 tablets by mouth every 6 (six) hours as needed for headache or migraine.    Historical Provider, MD  cephALEXin (KEFLEX) 500 MG capsule Take 1 capsule (500 mg total) by mouth 3 (three) times daily. 04/28/15   Elwin Mocha, MD  fluconazole (DIFLUCAN) 150 MG tablet Take 1 tablet (150 mg total) by mouth once. Repeat in 72 hours if needed. 03/12/15   Garlon Hatchet, PA-C  Hydroxychloroquine Sulfate (PLAQUENIL PO) Take 100 mg by mouth daily.    Historical Provider, MD  metroNIDAZOLE (METROGEL VAGINAL) 0.75 % vaginal gel Place 1 Applicatorful vaginally 2 (two) times daily. 04/28/15   Elwin Mocha, MD   BP 145/96 mmHg  Pulse 115  Temp(Src) 98.8 F (37.1 C) (Oral)  Resp 20  Ht 5' 8.5" (1.74 m)  Wt 323 lb (146.512 kg)  BMI 48.39 kg/m2  SpO2 97%  LMP 09/04/2015 Physical Exam  Constitutional: She appears well-developed and well-nourished. No distress.  HENT:  Head: Normocephalic and atraumatic.  Mouth/Throat: No oropharyngeal exudate.  mild erythema in pharynx  Eyes: Conjunctivae and EOM are normal. Pupils are equal, round, and reactive to light. Right eye exhibits no discharge. Left eye exhibits no discharge. No scleral icterus.  Neck: Normal range of motion. Neck supple. No JVD present. No thyromegaly present.  Cardiovascular: Regular rhythm, normal heart sounds and intact distal pulses.  Tachycardia present.  Exam reveals no gallop and no friction rub.   No murmur heard. Pulmonary/Chest: Effort normal and breath sounds normal. No respiratory distress. She has no wheezes. She has no rales.  Abdominal: Soft. Bowel sounds are normal. She exhibits no distension and no mass. There is no tenderness.  Musculoskeletal: Normal range of motion. She exhibits no edema or tenderness.  Lymphadenopathy:    She has no cervical adenopathy.  Neurological: She is alert. Coordination normal.  Skin: Skin is warm and dry. No rash noted. She is not diaphoretic. No erythema.  Psychiatric: She has a normal mood and affect. Her behavior is normal.  Nursing note and vitals reviewed.  ED Course  Procedures  DIAGNOSTIC STUDIES:  Oxygen Saturation is 97% on RA, normal by my interpretation.    COORDINATION OF CARE:  8:35 PM Will administer steroids prior to discharge. Discussed treatment plan with pt at bedside and pt agreed to plan.  Labs Review Labs Reviewed - No data to display  Imaging Review Dg Chest 2 View  09/14/2015  CLINICAL DATA:  Shortness of breath since yesterday, left chest pain today EXAM: CHEST  2 VIEW COMPARISON:  10/21/2014 FINDINGS: The heart size and mediastinal  contours are within normal limits. Both lungs are clear. The visualized skeletal structures are unremarkable. IMPRESSION: No active cardiopulmonary disease. Electronically Signed   By: Esperanza Heir M.D.   On: 09/14/2015 20:22   I have personally reviewed and evaluated these images as part of my medical decision-making.   EKG Interpretation   Date/Time:  Tuesday September 14 2015 19:43:27 EST Ventricular Rate:  118 PR Interval:  142 QRS Duration: 74 QT Interval:  326 QTC Calculation: 456 R Axis:   75 Text Interpretation:  Sinus tachycardia Otherwise normal ECG No  significant change since last tracing Confirmed by Anitra Lauth  MD, Alphonzo Lemmings  [phone]) on 09/14/2015 7:50:49 PM      MDM   Final diagnoses:  Asthma exacerbation  Flu   Pt with symptoms consistent with influenza that  started yesterday leading to left pleuritic chest pain and asthma exacerbation today.  Pt had temp of 102 last night. Wheezing on arrival but alleviated after 1 neb here.  Normal exam here other than tachycardia.  No signs of breathing difficulty  No signs of strep pharyngitis, otitis or abnormal abdominal findings.   CXR wnl and ekg with sinus tachy.  Low suspicion for dissection, MI or PE. Will continue antipyretica and rest and fluids and return for any further problems.  I personally performed the services described in this documentation, which was scribed in my presence.  The recorded information has been reviewed and considered.    Gwyneth Sprout, MD 09/15/15 [phone]

## 2015-09-14 NOTE — ED Notes (Signed)
Pt c/o cough and SOB since last night with a fever of 102 yesterday.  No fevers or antipyretics today, and pt has been using nebulizer at home without relief.  Pt c/o sharp left side chest pain, worse with movement and worse when lying on left side or flat on back.  Also cough with deep inspiration.  Pt is not on BCPs, no smoking, no long trips.

## 2015-09-14 NOTE — ED Notes (Signed)
Patient states that she is having SOB with increase in Neb. Treatments at home. The patient reports that it hurts in the center of her chest to the left side. Pain increases with movement and laying on her left side

## 2015-11-03 ENCOUNTER — Encounter (HOSPITAL_BASED_OUTPATIENT_CLINIC_OR_DEPARTMENT_OTHER): Payer: Self-pay

## 2015-11-03 ENCOUNTER — Emergency Department (HOSPITAL_BASED_OUTPATIENT_CLINIC_OR_DEPARTMENT_OTHER)
Admission: EM | Admit: 2015-11-03 | Discharge: 2015-11-03 | Disposition: A | Discharge status: Home / Self Care | Payer: BLUE CROSS/BLUE SHIELD | Attending: Emergency Medicine | Admitting: Emergency Medicine

## 2015-11-03 DIAGNOSIS — Y939 Activity, unspecified: Secondary | ICD-10-CM | POA: Insufficient documentation

## 2015-11-03 DIAGNOSIS — Y929 Unspecified place or not applicable: Secondary | ICD-10-CM | POA: Diagnosis not present

## 2015-11-03 DIAGNOSIS — S39012A Strain of muscle, fascia and tendon of lower back, initial encounter: Secondary | ICD-10-CM | POA: Diagnosis not present

## 2015-11-03 DIAGNOSIS — S3992XA Unspecified injury of lower back, initial encounter: Secondary | ICD-10-CM | POA: Diagnosis present

## 2015-11-03 DIAGNOSIS — Y999 Unspecified external cause status: Secondary | ICD-10-CM | POA: Insufficient documentation

## 2015-11-03 DIAGNOSIS — J45909 Unspecified asthma, uncomplicated: Secondary | ICD-10-CM | POA: Diagnosis not present

## 2015-11-03 MED ORDER — METHOCARBAMOL 500 MG PO TABS: 1000.0000 mg | ORAL_TABLET | Freq: Four times a day (QID) | ORAL | Status: DC | PRN

## 2015-11-03 MED ORDER — METHOCARBAMOL 500 MG PO TABS
1000.0000 mg | ORAL_TABLET | Freq: Once | ORAL | Status: AC
  Administered 2015-11-03: 1000 mg via ORAL
  Filled 2015-11-03: qty 2
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
## 2015-11-03 NOTE — Discharge Instructions (Signed)
You can also take  tylenol (acetaminophen) 975mg  (this is 3 over the counter pills) four times a day. Do not drink alcohol or combine with other medications that have acetaminophen as an ingredient (Read the labels!).    For breakthrough pain you may take Robaxin. Do not drink alcohol, drive or operate heavy machinery when taking Robaxin.    Low Back Strain With Rehab A strain is an injury in which a tendon or muscle is torn. The muscles and tendons of the lower back are vulnerable to strains. However, these muscles and tendons are very strong and require a great force to be injured. Strains are classified into three categories. Grade 1 strains cause pain, but the tendon is not lengthened. Grade 2 strains include a lengthened ligament, due to the ligament being stretched or partially ruptured. With grade 2 strains there is still function, although the function may be decreased. Grade 3 strains involve a complete tear of the tendon or muscle, and function is usually impaired. SYMPTOMS   Pain in the lower back.  Pain that affects one side more than the other.  Pain that gets worse with movement and may be felt in the hip, buttocks, or back of the thigh.  Muscle spasms of the muscles in the back.  Swelling along the muscles of the back.  Loss of strength of the back muscles.  Crackling sound (crepitation) when the muscles are touched. CAUSES  Lower back strains occur when a force is placed on the muscles or tendons that is greater than they can handle. Common causes of injury include:  Prolonged overuse of the muscle-tendon units in the lower back, usually from incorrect posture.  A single violent injury or force applied to the back. RISK INCREASES WITH:  Sports that involve twisting forces on the spine or a lot of bending at the waist (football, rugby, weightlifting, bowling, golf, tennis, speed skating, racquetball, swimming, running, gymnastics, diving).  Poor strength and  flexibility.  Failure to warm up properly before activity.  Family history of lower back pain or disk disorders.  Previous back injury or surgery (especially fusion).  Poor posture with lifting, especially heavy objects.  Prolonged sitting, especially with poor posture. PREVENTION   Learn and use proper posture when sitting or lifting (maintain proper posture when sitting, lift using the knees and legs, not at the waist).  Warm up and stretch properly before activity.  Allow for adequate recovery between workouts.  Maintain physical fitness:  Strength, flexibility, and endurance.  Cardiovascular fitness. PROGNOSIS  If treated properly, lower back strains usually heal within 6 weeks. RELATED COMPLICATIONS   Recurring symptoms, resulting in a chronic problem.  Chronic inflammation, scarring, and partial muscle-tendon tear.  Delayed healing or resolution of symptoms.  Prolonged disability. TREATMENT  Treatment first involves the use of ice and medicine, to reduce pain and inflammation. The use of strengthening and stretching exercises may help reduce pain with activity. These exercises may be performed at home or with a therapist. Severe injuries may require referral to a therapist for further evaluation and treatment, such as ultrasound. Your caregiver may advise that you wear a back brace or corset, to help reduce pain and discomfort. Often, prolonged bed rest results in greater harm then benefit. Corticosteroid injections may be recommended. However, these should be reserved for the most serious cases. It is important to avoid using your back when lifting objects. At night, sleep on your back on a firm mattress with a pillow placed under your  knees. If non-surgical treatment is unsuccessful, surgery may be needed.  MEDICATION   If pain medicine is needed, nonsteroidal anti-inflammatory medicines (aspirin and ibuprofen), or other minor pain relievers (acetaminophen), are often  advised.  Do not take pain medicine for 7 days before surgery.  Prescription pain relievers may be given, if your caregiver thinks they are needed. Use only as directed and only as much as you need.  Ointments applied to the skin may be helpful.  Corticosteroid injections may be given by your caregiver. These injections should be reserved for the most serious cases, because they may only be given a certain number of times. HEAT AND COLD  Cold treatment (icing) should be applied for 10 to 15 minutes every 2 to 3 hours for inflammation and pain, and immediately after activity that aggravates your symptoms. Use ice packs or an ice massage.  Heat treatment may be used before performing stretching and strengthening activities prescribed by your caregiver, physical therapist, or athletic trainer. Use a heat pack or a warm water soak. SEEK MEDICAL CARE IF:   Symptoms get worse or do not improve in 2 to 4 weeks, despite treatment.  You develop numbness, weakness, or loss of bowel or bladder function.  New, unexplained symptoms develop. (Drugs used in treatment may produce side effects.) EXERCISES  RANGE OF MOTION (ROM) AND STRETCHING EXERCISES - Low Back Strain Most people with lower back pain will find that their symptoms get worse with excessive bending forward (flexion) or arching at the lower back (extension). The exercises which will help resolve your symptoms will focus on the opposite motion.  Your physician, physical therapist or athletic trainer will help you determine which exercises will be most helpful to resolve your lower back pain. Do not complete any exercises without first consulting with your caregiver. Discontinue any exercises which make your symptoms worse until you speak to your caregiver.  If you have pain, numbness or tingling which travels down into your buttocks, leg or foot, the goal of the therapy is for these symptoms to move closer to your back and eventually resolve.  Sometimes, these leg symptoms will get better, but your lower back pain may worsen. This is typically an indication of progress in your rehabilitation. Be very alert to any changes in your symptoms and the activities in which you participated in the 24 hours prior to the change. Sharing this information with your caregiver will allow him/her to most efficiently treat your condition.  These exercises may help you when beginning to rehabilitate your injury. Your symptoms may resolve with or without further involvement from your physician, physical therapist or athletic trainer. While completing these exercises, remember:  Restoring tissue flexibility helps normal motion to return to the joints. This allows healthier, less painful movement and activity.  An effective stretch should be held for at least 30 seconds.  A stretch should never be painful. You should only feel a gentle lengthening or release in the stretched tissue. FLEXION RANGE OF MOTION AND STRETCHING EXERCISES: STRETCH - Flexion, Single Knee to Chest   Lie on a firm bed or floor with both legs extended in front of you.  Keeping one leg in contact with the floor, bring your opposite knee to your chest. Hold your leg in place by either grabbing behind your thigh or at your knee.  Pull until you feel a gentle stretch in your lower back. Hold __________ seconds.  Slowly release your grasp and repeat the exercise with the opposite side.  Repeat __________ times. Complete this exercise __________ times per day.  STRETCH - Flexion, Double Knee to Chest   Lie on a firm bed or floor with both legs extended in front of you.  Keeping one leg in contact with the floor, bring your opposite knee to your chest.  Tense your stomach muscles to support your back and then lift your other knee to your chest. Hold your legs in place by either grabbing behind your thighs or at your knees.  Pull both knees toward your chest until you feel a gentle  stretch in your lower back. Hold __________ seconds.  Tense your stomach muscles and slowly return one leg at a time to the floor. Repeat __________ times. Complete this exercise __________ times per day.  STRETCH - Low Trunk Rotation  Lie on a firm bed or floor. Keeping your legs in front of you, bend your knees so they are both pointed toward the ceiling and your feet are flat on the floor.  Extend your arms out to the side. This will stabilize your upper body by keeping your shoulders in contact with the floor.  Gently and slowly drop both knees together to one side until you feel a gentle stretch in your lower back. Hold for __________ seconds.  Tense your stomach muscles to support your lower back as you bring your knees back to the starting position. Repeat the exercise to the other side. Repeat __________ times. Complete this exercise __________ times per day  EXTENSION RANGE OF MOTION AND FLEXIBILITY EXERCISES: STRETCH - Extension, Prone on Elbows   Lie on your stomach on the floor, a bed will be too soft. Place your palms about shoulder width apart and at the height of your head.  Place your elbows under your shoulders. If this is too painful, stack pillows under your chest.  Allow your body to relax so that your hips drop lower and make contact more completely with the floor.  Hold this position for __________ seconds.  Slowly return to lying flat on the floor. Repeat __________ times. Complete this exercise __________ times per day.  RANGE OF MOTION - Extension, Prone Press Ups  Lie on your stomach on the floor, a bed will be too soft. Place your palms about shoulder width apart and at the height of your head.  Keeping your back as relaxed as possible, slowly straighten your elbows while keeping your hips on the floor. You may adjust the placement of your hands to maximize your comfort. As you gain motion, your hands will come more underneath your shoulders.  Hold this  position __________ seconds.  Slowly return to lying flat on the floor. Repeat __________ times. Complete this exercise __________ times per day.  RANGE OF MOTION- Quadruped, Neutral Spine   Assume a hands and knees position on a firm surface. Keep your hands under your shoulders and your knees under your hips. You may place padding under your knees for comfort.  Drop your head and point your tail bone toward the ground below you. This will round out your lower back like an angry cat. Hold this position for __________ seconds.  Slowly lift your head and release your tail bone so that your back sags into a large arch, like an old horse.  Hold this position for __________ seconds.  Repeat this until you feel limber in your lower back.  Now, find your "sweet spot." This will be the most comfortable position somewhere between the two previous positions. This  is your neutral spine. Once you have found this position, tense your stomach muscles to support your lower back.  Hold this position for __________ seconds. Repeat __________ times. Complete this exercise __________ times per day.  STRENGTHENING EXERCISES - Low Back Strain These exercises may help you when beginning to rehabilitate your injury. These exercises should be done near your "sweet spot." This is the neutral, low-back arch, somewhere between fully rounded and fully arched, that is your least painful position. When performed in this safe range of motion, these exercises can be used for people who have either a flexion or extension based injury. These exercises may resolve your symptoms with or without further involvement from your physician, physical therapist or athletic trainer. While completing these exercises, remember:   Muscles can gain both the endurance and the strength needed for everyday activities through controlled exercises.  Complete these exercises as instructed by your physician, physical therapist or athletic  trainer. Increase the resistance and repetitions only as guided.  You may experience muscle soreness or fatigue, but the pain or discomfort you are trying to eliminate should never worsen during these exercises. If this pain does worsen, stop and make certain you are following the directions exactly. If the pain is still present after adjustments, discontinue the exercise until you can discuss the trouble with your caregiver. STRENGTHENING - Deep Abdominals, Pelvic Tilt  Lie on a firm bed or floor. Keeping your legs in front of you, bend your knees so they are both pointed toward the ceiling and your feet are flat on the floor.  Tense your lower abdominal muscles to press your lower back into the floor. This motion will rotate your pelvis so that your tail bone is scooping upwards rather than pointing at your feet or into the floor.  With a gentle tension and even breathing, hold this position for __________ seconds. Repeat __________ times. Complete this exercise __________ times per day.  STRENGTHENING - Abdominals, Crunches   Lie on a firm bed or floor. Keeping your legs in front of you, bend your knees so they are both pointed toward the ceiling and your feet are flat on the floor. Cross your arms over your chest.  Slightly tip your chin down without bending your neck.  Tense your abdominals and slowly lift your trunk high enough to just clear your shoulder blades. Lifting higher can put excessive stress on the lower back and does not further strengthen your abdominal muscles.  Control your return to the starting position. Repeat __________ times. Complete this exercise __________ times per day.  STRENGTHENING - Quadruped, Opposite UE/LE Lift   Assume a hands and knees position on a firm surface. Keep your hands under your shoulders and your knees under your hips. You may place padding under your knees for comfort.  Find your neutral spine and gently tense your abdominal muscles so that  you can maintain this position. Your shoulders and hips should form a rectangle that is parallel with the floor and is not twisted.  Keeping your trunk steady, lift your right hand no higher than your shoulder and then your left leg no higher than your hip. Make sure you are not holding your breath. Hold this position __________ seconds.  Continuing to keep your abdominal muscles tense and your back steady, slowly return to your starting position. Repeat with the opposite arm and leg. Repeat __________ times. Complete this exercise __________ times per day.  STRENGTHENING - Lower Abdominals, Double Knee Lift  Lie  on a firm bed or floor. Keeping your legs in front of you, bend your knees so they are both pointed toward the ceiling and your feet are flat on the floor.  Tense your abdominal muscles to brace your lower back and slowly lift both of your knees until they come over your hips. Be certain not to hold your breath.  Hold __________ seconds. Using your abdominal muscles, return to the starting position in a slow and controlled manner. Repeat __________ times. Complete this exercise __________ times per day.  POSTURE AND BODY MECHANICS CONSIDERATIONS - Low Back Strain Keeping correct posture when sitting, standing or completing your activities will reduce the stress put on different body tissues, allowing injured tissues a chance to heal and limiting painful experiences. The following are general guidelines for improved posture. Your physician or physical therapist will provide you with any instructions specific to your needs. While reading these guidelines, remember:  The exercises prescribed by your provider will help you have the flexibility and strength to maintain correct postures.  The correct posture provides the best environment for your joints to work. All of your joints have less wear and tear when properly supported by a spine with good posture. This means you will experience a  healthier, less painful body.  Correct posture must be practiced with all of your activities, especially prolonged sitting and standing. Correct posture is as important when doing repetitive low-stress activities (typing) as it is when doing a single heavy-load activity (lifting). RESTING POSITIONS Consider which positions are most painful for you when choosing a resting position. If you have pain with flexion-based activities (sitting, bending, stooping, squatting), choose a position that allows you to rest in a less flexed posture. You would want to avoid curling into a fetal position on your side. If your pain worsens with extension-based activities (prolonged standing, working overhead), avoid resting in an extended position such as sleeping on your stomach. Most people will find more comfort when they rest with their spine in a more neutral position, neither too rounded nor too arched. Lying on a non-sagging bed on your side with a pillow between your knees, or on your back with a pillow under your knees will often provide some relief. Keep in mind, being in any one position for a prolonged period of time, no matter how correct your posture, can still lead to stiffness. PROPER SITTING POSTURE In order to minimize stress and discomfort on your spine, you must sit with correct posture. Sitting with good posture should be effortless for a healthy body. Returning to good posture is a gradual process. Many people can work toward this most comfortably by using various supports until they have the flexibility and strength to maintain this posture on their own. When sitting with proper posture, your ears will fall over your shoulders and your shoulders will fall over your hips. You should use the back of the chair to support your upper back. Your lower back will be in a neutral position, just slightly arched. You may place a small pillow or folded towel at the base of your lower back for support.  When working  at a desk, create an environment that supports good, upright posture. Without extra support, muscles tire, which leads to excessive strain on joints and other tissues. Keep these recommendations in mind: CHAIR:  A chair should be able to slide under your desk when your back makes contact with the back of the chair. This allows you to work closely.  The chair's height should allow your eyes to be level with the upper part of your monitor and your hands to be slightly lower than your elbows. BODY POSITION  Your feet should make contact with the floor. If this is not possible, use a foot rest.  Keep your ears over your shoulders. This will reduce stress on your neck and lower back. INCORRECT SITTING POSTURES  If you are feeling tired and unable to assume a healthy sitting posture, do not slouch or slump. This puts excessive strain on your back tissues, causing more damage and pain. Healthier options include:  Using more support, like a lumbar pillow.  Switching tasks to something that requires you to be upright or walking.  Talking a brief walk.  Lying down to rest in a neutral-spine position. PROLONGED STANDING WHILE SLIGHTLY LEANING FORWARD  When completing a task that requires you to lean forward while standing in one place for a long time, place either foot up on a stationary 2-4 inch high object to help maintain the best posture. When both feet are on the ground, the lower back tends to lose its slight inward curve. If this curve flattens (or becomes too large), then the back and your other joints will experience too much stress, tire more quickly, and can cause pain. CORRECT STANDING POSTURES Proper standing posture should be assumed with all daily activities, even if they only take a few moments, like when brushing your teeth. As in sitting, your ears should fall over your shoulders and your shoulders should fall over your hips. You should keep a slight tension in your abdominal muscles  to brace your spine. Your tailbone should point down to the ground, not behind your body, resulting in an over-extended swayback posture.  INCORRECT STANDING POSTURES  Common incorrect standing postures include a forward head, locked knees and/or an excessive swayback. WALKING Walk with an upright posture. Your ears, shoulders and hips should all line-up. PROLONGED ACTIVITY IN A FLEXED POSITION When completing a task that requires you to bend forward at your waist or lean over a low surface, try to find a way to stabilize 3 out of 4 of your limbs. You can place a hand or elbow on your thigh or rest a knee on the surface you are reaching across. This will provide you more stability so that your muscles do not fatigue as quickly. By keeping your knees relaxed, or slightly bent, you will also reduce stress across your lower back. CORRECT LIFTING TECHNIQUES DO :   Assume a wide stance. This will provide you more stability and the opportunity to get as close as possible to the object which you are lifting.  Tense your abdominals to brace your spine. Bend at the knees and hips. Keeping your back locked in a neutral-spine position, lift using your leg muscles. Lift with your legs, keeping your back straight.  Test the weight of unknown objects before attempting to lift them.  Try to keep your elbows locked down at your sides in order get the best strength from your shoulders when carrying an object.  Always ask for help when lifting heavy or awkward objects. INCORRECT LIFTING TECHNIQUES DO NOT:   Lock your knees when lifting, even if it is a small object.  Bend and twist. Pivot at your feet or move your feet when needing to change directions.  Assume that you can safely pick up even a paper clip without proper posture.   This information is not intended to  replace advice given to you by your health care provider. Make sure you discuss any questions you have with your health care provider.     Document Released: 07/10/2005 Document Revised: 07/31/2014 Document Reviewed: 10/22/2008 [employer] Patient Education [employer]
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
## 2016-02-05 ENCOUNTER — Emergency Department (HOSPITAL_BASED_OUTPATIENT_CLINIC_OR_DEPARTMENT_OTHER)
Admission: EM | Admit: 2016-02-05 | Discharge: 2016-02-05 | Disposition: A | Discharge status: Home / Self Care | Payer: BLUE CROSS/BLUE SHIELD | Attending: Emergency Medicine | Admitting: Emergency Medicine

## 2016-02-05 DIAGNOSIS — J45909 Unspecified asthma, uncomplicated: Secondary | ICD-10-CM | POA: Diagnosis not present

## 2016-02-05 DIAGNOSIS — Z7982 Long term (current) use of aspirin: Secondary | ICD-10-CM | POA: Diagnosis not present

## 2016-02-05 DIAGNOSIS — Z79899 Other long term (current) drug therapy: Secondary | ICD-10-CM | POA: Insufficient documentation

## 2016-02-05 DIAGNOSIS — Z7952 Long term (current) use of systemic steroids: Secondary | ICD-10-CM | POA: Insufficient documentation

## 2016-02-05 DIAGNOSIS — A599 Trichomoniasis, unspecified: Secondary | ICD-10-CM | POA: Diagnosis not present

## 2016-02-05 DIAGNOSIS — R1031 Right lower quadrant pain: Secondary | ICD-10-CM | POA: Diagnosis present

## 2016-02-05 LAB — URINALYSIS, ROUTINE W REFLEX MICROSCOPIC
GLUCOSE, UA: NEGATIVE mg/dL
KETONES UR: NEGATIVE mg/dL
Nitrite: NEGATIVE
PROTEIN: NEGATIVE mg/dL
Specific Gravity, Urine: 1.042 — ABNORMAL HIGH (ref 1.005–1.030)
pH: 5.5 (ref 5.0–8.0)

## 2016-02-05 LAB — WET PREP, GENITAL
SPERM: NONE SEEN
YEAST WET PREP: NONE SEEN

## 2016-02-05 LAB — URINE MICROSCOPIC-ADD ON

## 2016-02-05 LAB — PREGNANCY, URINE: PREG TEST UR: NEGATIVE

## 2016-02-05 MED ORDER — METRONIDAZOLE 500 MG PO TABS
2000.0000 mg | ORAL_TABLET | Freq: Once | ORAL | Status: AC
  Administered 2016-02-05: 2000 mg via ORAL
  Filled 2016-02-05: qty 4

## 2016-02-05 MED ORDER — AZITHROMYCIN 1 G PO PACK
1.0000 g | PACK | Freq: Once | ORAL | Status: AC
  Administered 2016-02-05: 1 g via ORAL
  Filled 2016-02-05: qty 1

## 2016-02-05 MED ORDER — CEFTRIAXONE SODIUM 250 MG IJ SOLR
250.0000 mg | Freq: Once | INTRAMUSCULAR | Status: AC
  Administered 2016-02-05: 250 mg via INTRAMUSCULAR
  Filled 2016-02-05: qty 250

## 2016-02-05 NOTE — ED Notes (Signed)
C/o RLQ abd pain and right flank pain reports clear discharge with odor

## 2016-02-05 NOTE — ED Provider Notes (Signed)
CSN: [id number]     Arrival date & time 02/05/16  0212 History   First MD Initiated Contact with Patient 02/05/16 0353     Chief Complaint  Patient presents with  . Abdominal Pain     (Consider location/radiation/quality/duration/timing/severity/associated sxs/prior Treatment) Patient is a 30 y.o. female presenting with abdominal pain. The history is provided by the patient.  Abdominal Pain Pain location:  Suprapubic Pain quality: aching   Pain radiates to:  Does not radiate Pain severity:  Moderate Onset quality:  Gradual Timing:  Constant Progression:  Unchanged Chronicity:  New Context: not trauma   Relieved by:  Nothing Worsened by:  Nothing tried Associated symptoms: vaginal discharge   Associated symptoms: no hematuria   Vaginal discharge:    Quality:  White   Onset quality:  Gradual   Duration:  3 weeks   Timing:  Constant   Progression:  Unchanged   Chronicity:  New Risk factors: no alcohol abuse and not pregnant     Past Medical History  Diagnosis Date  . 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 HSV infection   . Asthma   . Lupus (HCC)     dx age 30  . Headache(784.0)   . Infection     UTI  . Anxiety     doing good now   Past Surgical History  Procedure Laterality Date  . Tonsillectomy and adenoidectomy  1998  . Mouth surgery      2 teeth removed- 1 wisdom and 1 in front of it  . Wisdom tooth extraction     Family History  Problem Relation Age of Onset  . Diabetes Maternal Aunt   . Cancer Maternal Grandmother 72    COLON CA  . Diabetes Maternal Grandmother   . Hypertension Maternal Grandfather   . Cancer Maternal Grandfather     prostate  . Asthma Mother   . Asthma Brother   . Anesthesia problems Neg Hx   . Hypotension Neg Hx   . Malignant hyperthermia Neg Hx   . Pseudochol deficiency Neg Hx   . Cancer Paternal Grandmother     breast   Social History  Substance Use Topics  . Smoking status: Never Smoker   .  Smokeless tobacco: Never Used  . Alcohol Use: No   OB History    Gravida Para Term Preterm AB TAB SAB Ectopic Multiple Living   3 1 1  1  1   1      Review of Systems  Gastrointestinal: Positive for abdominal pain.  Genitourinary: Positive for vaginal discharge. Negative for hematuria, flank pain and genital sores.  All other systems reviewed and are negative.     Allergies  Bactrim; Prednisone; Nsaids; Other; Pineapple; Procardia; and Vicodin  Home Medications   Prior to Admission medications   Medication Sig Start Date End Date Taking? Authorizing Provider  albuterol (PROVENTIL HFA;VENTOLIN HFA) 108 (90 Base) MCG/ACT inhaler Inhale 2 puffs into the lungs every 6 (six) hours as needed for wheezing or shortness of breath.    Historical Provider, MD  albuterol (PROVENTIL) (2.5 MG/3ML) 0.083% nebulizer solution Take 2.5 mg by nebulization every 6 (six) hours as needed for wheezing or shortness of breath.    Historical Provider, MD  aspirin-acetaminophen-caffeine (EXCEDRIN MIGRAINE) [id number] MG per tablet Take 2 tablets by mouth every 6 (six) hours as needed for headache or migraine.    Historical Provider, MD  Hydroxychloroquine Sulfate (PLAQUENIL PO) Take 100 mg by mouth  daily.    Historical Provider, MD  methocarbamol (ROBAXIN) 500 MG tablet Take 2 tablets (1,000 mg total) by mouth 4 (four) times daily as needed (Pain). 11/03/15   Nicole Pisciotta, PA-C  methylPREDNISolone (MEDROL) 2 MG tablet Take 2 mg by mouth daily.    Historical Provider, MD   BP 147/103 mmHg  Pulse 95  Temp(Src) 98 F (36.7 C) (Oral)  Resp 18  Ht  (1.727 m)  Wt 335 lb (151.955 kg)  BMI 50.95 kg/m2  SpO2 99%  LMP 01/23/2016 Physical Exam  Constitutional: She is oriented to person, place, and time. She appears well-developed and well-nourished. No distress.  HENT:  Head: Normocephalic and atraumatic.  Mouth/Throat: Oropharynx is clear and moist.  Eyes: Conjunctivae are normal. Pupils are equal,  round, and reactive to light.  Neck: Normal range of motion. Neck supple.  Cardiovascular: Normal rate, regular rhythm and intact distal pulses.   Pulmonary/Chest: Effort normal and breath sounds normal. No respiratory distress. She has no wheezes. She has no rales.  Abdominal: Soft. Bowel sounds are normal. There is no tenderness. There is no rebound and no guarding.  Genitourinary: Vaginal discharge found.  Scant white discharge, chaperone present  Musculoskeletal: Normal range of motion.  Neurological: She is alert and oriented to person, place, and time.  Skin: Skin is warm and dry.  Psychiatric: She has a normal mood and affect.    ED Course  Procedures (including critical care time) Labs Review Labs Reviewed  URINALYSIS, ROUTINE W REFLEX MICROSCOPIC (NOT AT [facility]) - Abnormal; Notable for the following:    Color, Urine AMBER (*)    APPearance CLOUDY (*)    Specific Gravity, Urine 1.042 (*)    Hgb urine dipstick SMALL (*)    Bilirubin Urine SMALL (*)    Leukocytes, UA SMALL (*)    All other components within normal limits  URINE MICROSCOPIC-ADD ON - Abnormal; Notable for the following:    Squamous Epithelial / LPF 6-30 (*)    Bacteria, UA FEW (*)    All other components within normal limits  WET PREP, GENITAL  PREGNANCY, URINE  GC/CHLAMYDIA PROBE AMP ([place]) NOT AT [facility]    Imaging Review No results found. I have personally reviewed and evaluated these images and lab results as part of my medical decision-making.   EKG Interpretation None      MDM   Final diagnoses:  None    Filed Vitals:   02/05/16 0220  BP: 147/103  Pulse: 95  Temp: 98 F (36.7 C)  Resp: 18    Results for orders placed or performed during the hospital encounter of 02/05/16  Wet prep, genital  Result Value Ref Range   Yeast Wet Prep HPF POC NONE SEEN NONE SEEN   Trich, Wet Prep PRESENT (A) NONE SEEN   Clue Cells Wet Prep HPF POC PRESENT (A) NONE SEEN   WBC, Wet Prep HPF POC  MANY (A) NONE SEEN   Sperm NONE SEEN   Urinalysis, Routine w reflex microscopic (not at [facility])  Result Value Ref Range   Color, Urine AMBER (A) YELLOW   APPearance CLOUDY (A) CLEAR   Specific Gravity, Urine 1.042 (H) 1.005 - 1.030   pH 5.5 5.0 - 8.0   Glucose, UA NEGATIVE NEGATIVE mg/dL   Hgb urine dipstick SMALL (A) NEGATIVE   Bilirubin Urine SMALL (A) NEGATIVE   Ketones, ur NEGATIVE NEGATIVE mg/dL   Protein, ur NEGATIVE NEGATIVE mg/dL   Nitrite NEGATIVE NEGATIVE  Leukocytes, UA SMALL (A) NEGATIVE  Pregnancy, urine  Result Value Ref Range   Preg Test, Ur NEGATIVE NEGATIVE  Urine microscopic-add on  Result Value Ref Range   Squamous Epithelial / LPF 6-30 (A) NONE SEEN   WBC, UA 6-30 0 - 5 WBC/hpf   RBC / HPF 0-5 0 - 5 RBC/hpf   Bacteria, UA FEW (A) NONE SEEN   Urine-Other MUCOUS PRESENT    No results found.  Medications  cefTRIAXone (ROCEPHIN) injection 250 mg (not administered)  metroNIDAZOLE (FLAGYL) tablet 2,000 mg (not administered)  azithromycin (ZITHROMAX) powder 1 g (not administered)   Follow up with your GYN for recheck in 7 days no sexual activity of any kind until 7 days after all partners treated. Based on history and exam patient has been appropriately medically screened and emergency conditions excluded. Patient is stable for discharge at this time.   Follow up provided and strict return precautions given.     Cy Blamer, MD 02/05/16 [phone]

## 2016-02-05 NOTE — Discharge Instructions (Signed)
Sexually Transmitted Disease °A sexually transmitted disease (STD) is a disease or infection that may be passed (transmitted) from person to person, usually during sexual activity. This may happen by way of saliva, semen, blood, vaginal mucus, or urine. Common STDs include: °· Gonorrhea. °· Chlamydia. °· Syphilis. °· HIV and AIDS. °· Genital herpes. °· Hepatitis B and C. °· Trichomonas. °· Human papillomavirus (HPV). °· Pubic lice. °· Scabies. °· Mites. °· Bacterial vaginosis. °WHAT ARE CAUSES OF STDs? °An STD may be caused by bacteria, a virus, or parasites. STDs are often transmitted during sexual activity if one person is infected. However, they may also be transmitted through nonsexual means. STDs may be transmitted after:  °· Sexual intercourse with an infected person. °· Sharing sex toys with an infected person. °· Sharing needles with an infected person or using unclean piercing or tattoo needles. °· Having intimate contact with the genitals, mouth, or rectal areas of an infected person. °· Exposure to infected fluids during birth. °WHAT ARE THE SIGNS AND SYMPTOMS OF STDs? °Different STDs have different symptoms. Some people may not have any symptoms. If symptoms are present, they may include: °· Painful or bloody urination. °· Pain in the pelvis, abdomen, vagina, anus, throat, or eyes. °· A skin rash, itching, or irritation. °· Growths, ulcerations, blisters, or sores in the genital and anal areas. °· Abnormal vaginal discharge with or without bad odor. °· Penile discharge in men. °· Fever. °· Pain or bleeding during sexual intercourse. °· Swollen glands in the groin area. °· Yellow skin and eyes (jaundice). This is seen with hepatitis. °· Swollen testicles. °· Infertility. °· Sores and blisters in the mouth. °HOW ARE STDs DIAGNOSED? °To make a diagnosis, your health care provider may: °· Take a medical history. °· Perform a physical exam. °· Take a sample of any discharge to examine. °· Swab the throat,  cervix, opening to the penis, rectum, or vagina for testing. °· Test a sample of your first morning urine. °· Perform blood tests. °· Perform a Pap test, if this applies. °· Perform a colposcopy. °· Perform a laparoscopy. °HOW ARE STDs TREATED? °Treatment depends on the STD. Some STDs may be treated but not cured. °· Chlamydia, gonorrhea, trichomonas, and syphilis can be cured with antibiotic medicine. °· Genital herpes, hepatitis, and HIV can be treated, but not cured, with prescribed medicines. The medicines lessen symptoms. °· Genital warts from HPV can be treated with medicine or by freezing, burning (electrocautery), or surgery. Warts may come back. °· HPV cannot be cured with medicine or surgery. However, abnormal areas may be removed from the cervix, vagina, or vulva. °· If your diagnosis is confirmed, your recent sexual partners need treatment. This is true even if they are symptom-free or have a negative culture or evaluation. They should not have sex until their health care providers say it is okay. °· Your health care provider may test you for infection again 3 months after treatment. °HOW CAN I REDUCE MY RISK OF GETTING AN STD? °Take these steps to reduce your risk of getting an STD: °· Use latex condoms, dental dams, and water-soluble lubricants during sexual activity. Do not use petroleum jelly or oils. °· Avoid having multiple sex partners. °· Do not have sex with someone who has other sex partners °· Do not have sex with anyone you do not know or who is at high risk for an STD. °· Avoid risky sex practices that can break your skin. °· Do not have sex   if you have open sores on your mouth or skin. °· Avoid drinking too much alcohol or taking illegal drugs. Alcohol and drugs can affect your judgment and put you in a vulnerable position. °· Avoid engaging in oral and anal sex acts. °· Get vaccinated for HPV and hepatitis. If you have not received these vaccines in the past, talk to your health care  provider about whether one or both might be right for you. °· If you are at risk of being infected with HIV, it is recommended that you take a prescription medicine daily to prevent HIV infection. This is called pre-exposure prophylaxis (PrEP). You are considered at risk if: °· You are a man who has sex with other men (MSM). °· You are a heterosexual man or woman and are sexually active with more than one partner. °· You take drugs by injection. °· You are sexually active with a partner who has HIV. °· Talk with your health care provider about whether you are at high risk of being infected with HIV. If you choose to begin PrEP, you should first be tested for HIV. You should then be tested every 3 months for as long as you are taking PrEP. °WHAT SHOULD I DO IF I THINK I HAVE AN STD? °· See your health care provider. °· Tell your sexual partner(s). They should be tested and treated for any STDs. °· Do not have sex until your health care provider says it is okay. °WHEN SHOULD I GET IMMEDIATE MEDICAL CARE? °Contact your health care provider right away if:  °· You have severe abdominal pain. °· You are a man and notice swelling or pain in your testicles. °· You are a woman and notice swelling or pain in your vagina. °  °This information is not intended to replace advice given to you by your health care provider. Make sure you discuss any questions you have with your health care provider. °  °Document Released: 09/30/2002 Document Revised: 07/31/2014 Document Reviewed: 01/28/2013 °Elsevier Interactive Patient Education ©2016 [employer] ° °Trichomoniasis °Trichomoniasis is an infection caused by an organism called Trichomonas. The infection can affect both women and men. In women, the outer female genitalia and the vagina are affected. In men, the penis is mainly affected, but the prostate and other reproductive organs can also be involved. Trichomoniasis is a sexually transmitted infection (STI) and is most often  passed to another person through sexual contact.  °RISK FACTORS °· Having unprotected sexual intercourse. °· Having sexual intercourse with an infected partner. °SIGNS AND SYMPTOMS  °Symptoms of trichomoniasis in women include: °· Abnormal gray-green frothy vaginal discharge. °· Itching and irritation of the vagina. °· Itching and irritation of the area outside the vagina. °Symptoms of trichomoniasis in men include:  °· Penile discharge with or without pain. °· Pain during urination. This results from inflammation of the urethra. °DIAGNOSIS  °Trichomoniasis may be found during a Pap test or physical exam. Your health care provider may use one of the following methods to help diagnose this infection: °· Testing the pH of the vagina with a test tape. °· Using a vaginal swab test that checks for the Trichomonas organism. A test is available that provides results within a few minutes. °· Examining a urine sample. °· Testing vaginal secretions. °Your health care provider may test you for other STIs, including HIV. °TREATMENT  °· You may be given medicine to fight the infection. Women should inform their health care provider if they could be or   are pregnant. Some medicines used to treat the infection should not be taken during pregnancy. °· Your health care provider may recommend over-the-counter medicines or creams to decrease itching or irritation. °· Your sexual partner will need to be treated if infected. °· Your health care provider may test you for infection again 3 months after treatment. °HOME CARE INSTRUCTIONS  °· Take medicines only as directed by your health care provider. °· Take over-the-counter medicine for itching or irritation as directed by your health care provider. °· Do not have sexual intercourse while you have the infection. °· Women should not douche or wear tampons while they have the infection. °· Discuss your infection with your partner. Your partner may have gotten the infection from you, or you  may have gotten it from your partner. °· Have your sex partner get examined and treated if necessary. °· Practice safe, informed, and protected sex. °· See your health care provider for other STI testing. °SEEK MEDICAL CARE IF:  °· You still have symptoms after you finish your medicine. °· You develop abdominal pain. °· You have pain when you urinate. °· You have bleeding after sexual intercourse. °· You develop a rash. °· Your medicine makes you sick or makes you throw up (vomit). °MAKE SURE YOU: °· Understand these instructions. °· Will watch your condition. °· Will get help right away if you are not doing well or get worse. °  °This information is not intended to replace advice given to you by your health care provider. Make sure you discuss any questions you have with your health care provider. °  °Document Released: 01/03/2001 Document Revised: 07/31/2014 Document Reviewed: 04/21/2013 °Elsevier Interactive Patient Education ©2016 Else[employer] Inc[employer] ° °

## 2016-02-07 LAB — GC/CHLAMYDIA PROBE AMP (~~LOC~~) NOT AT ARMC
CHLAMYDIA, DNA PROBE: NEGATIVE
Neisseria Gonorrhea: NEGATIVE

## 2016-05-04 ENCOUNTER — Inpatient Hospital Stay (HOSPITAL_COMMUNITY)
Admission: AD | Admit: 2016-05-04 | Discharge: 2016-05-05 | Disposition: A | Discharge status: Home / Self Care | Payer: BLUE CROSS/BLUE SHIELD | Source: Ambulatory Visit | Attending: Family Medicine | Admitting: Family Medicine

## 2016-05-04 ENCOUNTER — Encounter (HOSPITAL_COMMUNITY): Payer: Self-pay | Admitting: *Deleted

## 2016-05-04 DIAGNOSIS — R102 Pelvic and perineal pain: Secondary | ICD-10-CM | POA: Insufficient documentation

## 2016-05-04 DIAGNOSIS — M329 Systemic lupus erythematosus, unspecified: Secondary | ICD-10-CM | POA: Insufficient documentation

## 2016-05-04 DIAGNOSIS — Z885 Allergy status to narcotic agent status: Secondary | ICD-10-CM | POA: Insufficient documentation

## 2016-05-04 DIAGNOSIS — N940 Mittelschmerz: Secondary | ICD-10-CM | POA: Insufficient documentation

## 2016-05-04 DIAGNOSIS — Z7982 Long term (current) use of aspirin: Secondary | ICD-10-CM | POA: Insufficient documentation

## 2016-05-04 LAB — URINALYSIS, ROUTINE W REFLEX MICROSCOPIC
BILIRUBIN URINE: NEGATIVE
Glucose, UA: NEGATIVE mg/dL
KETONES UR: NEGATIVE mg/dL
Leukocytes, UA: NEGATIVE
NITRITE: NEGATIVE
PH: 6 (ref 5.0–8.0)
Protein, ur: NEGATIVE mg/dL
Specific Gravity, Urine: >1.03 — ABNORMAL HIGH (ref 1.005–1.030)

## 2016-05-04 LAB — URINE MICROSCOPIC-ADD ON

## 2016-05-04 LAB — POCT PREGNANCY, URINE: Preg Test, Ur: NEGATIVE

## 2016-05-04 NOTE — MAU Note (Signed)
Patient presents to mau with c/o lower abdmoninal and back pain that started 2 weeks ago but has gotten worse tonight. Has not taken anything for the pain. REporting vaginal odor but no change in vaginal discharge. At first thought may have yeast infection because of the odor and tried OTC cream but states odor and cramping has gotten worse. LMP 9/15.

## 2016-05-04 NOTE — MAU Note (Signed)
Pt reports discharge for 2 weeks. Odor since yesterday. Bilateral abdominal pain off and on for 2 weeks, but worse today. Pt did a vaginal cream treatment for yeast infection yesterday and today, but the cramping has been worse since using the medicine. Pt has not taken anything for the pain.  

## 2016-05-05 DIAGNOSIS — N940 Mittelschmerz: Secondary | ICD-10-CM

## 2016-05-05 LAB — WET PREP, GENITAL
Clue Cells Wet Prep HPF POC: NONE SEEN
Sperm: NONE SEEN
Trich, Wet Prep: NONE SEEN
YEAST WET PREP: NONE SEEN

## 2016-05-05 LAB — GC/CHLAMYDIA PROBE AMP (~~LOC~~) NOT AT ARMC
Chlamydia: NEGATIVE
Neisseria Gonorrhea: NEGATIVE

## 2016-05-05 NOTE — MAU Provider Note (Signed)
History     CSN: [id number]  Arrival date and time: 05/04/16 2209   First Provider Initiated Contact with Patient 05/05/16 0020      Chief Complaint  Patient presents with  . Pelvic Pain   Pelvic Pain  The patient's primary symptoms include genital itching, pelvic pain and vaginal discharge. This is a new problem. The current episode started in the past 7 days. The problem occurs constantly. The problem has been gradually worsening. Pain severity now: 5/10  The problem affects both sides. She is not pregnant. Associated symptoms include abdominal pain. Pertinent negatives include no chills, constipation, diarrhea, dysuria, fever, frequency, nausea, urgency or vomiting. The vaginal discharge was clear and malodorous. There has been no bleeding. Exacerbated by: patient used OTC yeast cream today, and states that the pain is worse after using the medication  She has tried nothing for the symptoms. She uses nothing for contraception. Menstrual history: LMP 04/07/16    Past Medical History:  Diagnosis Date  . Anxiety    doing good now  . Asthma   . Headache(784.0)   . HSV infection   . Infection    UTI  . Lupus    dx age 89  . 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 MOUTH SURGERY     2 teeth removed- 1 wisdom and 1 in front of it  . TONSILLECTOMY AND ADENOIDECTOMY  1998  . WISDOM TOOTH EXTRACTION      Family History  Problem Relation Age of Onset  . Diabetes Maternal Aunt   . Cancer Maternal Grandmother 72    COLON CA  . Diabetes Maternal Grandmother   . Hypertension Maternal Grandfather   . Cancer Maternal Grandfather     prostate  . Asthma Mother   . Asthma Brother   . Cancer Paternal Grandmother     breast  . Anesthesia problems Neg Hx   . Hypotension Neg Hx   . Malignant hyperthermia Neg Hx   . Pseudochol deficiency Neg Hx     Social History  Substance Use Topics  . Smoking status:  Never Smoker  . Smokeless tobacco: Never Used  . Alcohol use No    Allergies:  Allergies  Allergen Reactions  . Bactrim Anaphylaxis and Hives  . Prednisone Anaphylaxis and Hives    Has been on Dexamethasone without issue.  . Nsaids     Has Lupus, reccommended to avoid NSAIDs  . Other Itching    Shrimp:throat itching "can eat other shellfish"  . Pineapple Itching  . Procardia [Nifedipine] Swelling    Swelling of tongue  . Vicodin [Hydrocodone-Acetaminophen] Hives and Swelling    Can take Percocet without difficulty    Prescriptions Prior to Admission  Medication Sig Dispense Refill Last Dose  . albuterol (PROVENTIL HFA;VENTOLIN HFA) 108 (90 Base) MCG/ACT inhaler Inhale 2 puffs into the lungs every 6 (six) hours as needed for wheezing or shortness of breath.   Past Week at Unknown time  . aspirin-acetaminophen-caffeine (EXCEDRIN MIGRAINE) 250-250-65 MG per tablet Take 2 tablets by mouth every 6 (six) hours as needed for headache or migraine.   05/03/2016 at Unknown time  . Hydroxychloroquine Sulfate (PLAQUENIL PO) Take 100 mg by mouth daily.   05/03/2016 at Unknown time  . methylPREDNISolone (MEDROL) 2 MG tablet Take 2 mg by mouth daily.   05/04/2016 at Unknown time  . albuterol (PROVENTIL) (2.5 MG/3ML) 0.083% nebulizer solution Take 2.5  mg by nebulization every 6 (six) hours as needed for wheezing or shortness of breath.   More than a month at Unknown time  . methocarbamol (ROBAXIN) 500 MG tablet Take 2 tablets (1,000 mg total) by mouth 4 (four) times daily as needed (Pain). 20 tablet 0     Review of Systems  Constitutional: Negative for chills and fever.  Gastrointestinal: Positive for abdominal pain. Negative for constipation, diarrhea, nausea and vomiting.  Genitourinary: Positive for pelvic pain and vaginal discharge. Negative for dysuria, frequency and urgency.   Physical Exam   Blood pressure 129/85, pulse 95, temperature 97.7 F (36.5 C), temperature source Oral, resp.  rate 18, height 5' 8.5" (1.74 m), weight (!) 345 lb 0.6 oz (156.5 kg), last menstrual period 04/07/2016, SpO2 100 %.  Physical Exam  Nursing note and vitals reviewed. Constitutional: She is oriented to person, place, and time. She appears well-developed. No distress.  HENT:  Head: Normocephalic.  Cardiovascular: Normal rate.   Respiratory: Effort normal.  GI: Soft. There is no tenderness. There is no rebound.  Genitourinary:  Genitourinary Comments:  External: no lesion Vagina: copious amount of egg white cervical mucous  Cervix: pink, smooth, no CMT Uterus: NSSC Adnexa: NT   Neurological: She is alert and oriented to person, place, and time.  Skin: Skin is warm and dry.  Psychiatric: She has a normal mood and affect.   Results for orders placed or performed during the hospital encounter of 05/04/16 (from the past 24 hour(s))  Urinalysis, Routine w reflex microscopic (not at [facility])     Status: Abnormal   Collection Time: 05/04/16 11:05 PM  Result Value Ref Range   Color, Urine YELLOW YELLOW   APPearance CLEAR CLEAR   Specific Gravity, Urine >1.030 (H) 1.005 - 1.030   pH 6.0 5.0 - 8.0   Glucose, UA NEGATIVE NEGATIVE mg/dL   Hgb urine dipstick MODERATE (A) NEGATIVE   Bilirubin Urine NEGATIVE NEGATIVE   Ketones, ur NEGATIVE NEGATIVE mg/dL   Protein, ur NEGATIVE NEGATIVE mg/dL   Nitrite NEGATIVE NEGATIVE   Leukocytes, UA NEGATIVE NEGATIVE  Urine microscopic-add on     Status: Abnormal   Collection Time: 05/04/16 11:05 PM  Result Value Ref Range   Squamous Epithelial / LPF 0-5 (A) NONE SEEN   WBC, UA 0-5 0 - 5 WBC/hpf   RBC / HPF 6-30 0 - 5 RBC/hpf   Bacteria, UA FEW (A) NONE SEEN   Urine-Other MUCOUS PRESENT   Pregnancy, urine POC     Status: None   Collection Time: 05/04/16 11:20 PM  Result Value Ref Range   Preg Test, Ur NEGATIVE NEGATIVE  Wet prep, genital     Status: Abnormal   Collection Time: 05/05/16 12:25 AM  Result Value Ref Range   Yeast Wet Prep HPF POC NONE  SEEN NONE SEEN   Trich, Wet Prep NONE SEEN NONE SEEN   Clue Cells Wet Prep HPF POC NONE SEEN NONE SEEN   WBC, Wet Prep HPF POC FEW (A) NONE SEEN   Sperm NONE SEEN    MAU Course  Procedures  MDM   Assessment and Plan   1. Mittelschmerz    DC home Comfort measures reviewed  RX: none  Return to MAU as needed FU with OB as planned  Follow-up Information    [facility] .   Contact information: [address] [place] [address] [phone]            [name] 05/05/2016,  12:21 AM

## 2016-05-05 NOTE — Discharge Instructions (Signed)
Mittelschmerz °Mittelschmerz is pain in the lower abdomen that is felt between periods. °· The pain affects one side of the abdomen. The side that it affects may change from month to month. °· The pain may be mild or severe. °· The pain may last from minutes to hours. It does not last longer than 1-2 days. °· The pain can happen with nausea and light vaginal bleeding. °Mittelschmerz is common among women. It is caused by the growth and release of an egg from an ovary, and it is a natural part of the ovulation cycle. It often happens about two weeks after a woman's period ends. °HOME CARE INSTRUCTIONS °Pay attention to any changes in your condition. Take these actions to help with your pain: °· Try soaking in a hot bath. °· Take over-the-counter and prescription medicines only as told by your health care provider. °· Keep all follow-up visits as told by your health care provider. This is important. °SEEK MEDICAL CARE IF: °· You have very bad pain most months. °· You have abdominal pain that lasts longer than 24 hours. °· Your pain medicine is not helping. °· You have a fever. °· You have nausea or vomiting that will not go away. °· You miss your period. °· You have vaginal bleeding between your periods that is heavier than spotting. °  °This information is not intended to replace advice given to you by your health care provider. Make sure you discuss any questions you have with your health care provider. °  °Document Released: 06/30/2002 Document Revised: 03/31/2015 Document Reviewed: 10/05/2014 °Elsevier Interactive Patient Education ©2016 [employer]vier Inc. ° °

## 2016-05-10 ENCOUNTER — Encounter (HOSPITAL_COMMUNITY): Payer: Self-pay | Admitting: Emergency Medicine

## 2016-05-10 ENCOUNTER — Emergency Department (HOSPITAL_COMMUNITY): Payer: BLUE CROSS/BLUE SHIELD

## 2016-05-10 DIAGNOSIS — R079 Chest pain, unspecified: Secondary | ICD-10-CM | POA: Insufficient documentation

## 2016-05-10 DIAGNOSIS — Z5321 Procedure and treatment not carried out due to patient leaving prior to being seen by health care provider: Secondary | ICD-10-CM | POA: Insufficient documentation

## 2016-05-10 LAB — CBC
HEMATOCRIT: 37.2 % (ref 36.0–46.0)
HEMOGLOBIN: 11.9 g/dL — AB (ref 12.0–15.0)
MCH: 26.8 pg (ref 26.0–34.0)
MCHC: 32 g/dL (ref 30.0–36.0)
MCV: 83.8 fL (ref 78.0–100.0)
Platelets: 378 10*3/uL (ref 150–400)
RBC: 4.44 MIL/uL (ref 3.87–5.11)
RDW: 14.3 % (ref 11.5–15.5)
WBC: 6.7 10*3/uL (ref 4.0–10.5)

## 2016-05-10 LAB — I-STAT TROPONIN, ED: TROPONIN I, POC: 0 ng/mL (ref 0.00–0.08)

## 2016-05-10 LAB — BASIC METABOLIC PANEL
ANION GAP: 6 (ref 5–15)
BUN: 14 mg/dL (ref 6–20)
CALCIUM: 9.3 mg/dL (ref 8.9–10.3)
CO2: 26 mmol/L (ref 22–32)
Chloride: 107 mmol/L (ref 101–111)
Creatinine, Ser: 0.75 mg/dL (ref 0.44–1.00)
Glucose, Bld: 84 mg/dL (ref 65–99)
POTASSIUM: 3.8 mmol/L (ref 3.5–5.1)
SODIUM: 139 mmol/L (ref 135–145)

## 2016-05-10 NOTE — ED Triage Notes (Signed)
Pt is c/o chest pain that started yesterday and has progressively gotten worse  Throughout the day  Pt states it is a heavy pressure on the left side

## 2016-05-11 ENCOUNTER — Emergency Department (HOSPITAL_COMMUNITY)
Admission: EM | Admit: 2016-05-11 | Discharge: 2016-05-11 | Disposition: A | Discharge status: Home / Self Care | Payer: BLUE CROSS/BLUE SHIELD | Attending: Emergency Medicine | Admitting: Emergency Medicine

## 2016-05-11 NOTE — ED Triage Notes (Signed)
Called pt to take to room  No response from lobby 

## 2016-05-11 NOTE — ED Notes (Signed)
Pt called multiple time from the lobby with no response.

## 2016-05-12 ENCOUNTER — Encounter (HOSPITAL_BASED_OUTPATIENT_CLINIC_OR_DEPARTMENT_OTHER): Payer: Self-pay

## 2016-05-12 ENCOUNTER — Emergency Department (HOSPITAL_BASED_OUTPATIENT_CLINIC_OR_DEPARTMENT_OTHER)
Admission: EM | Admit: 2016-05-12 | Discharge: 2016-05-12 | Disposition: A | Discharge status: Home / Self Care | Payer: BLUE CROSS/BLUE SHIELD | Attending: Emergency Medicine | Admitting: Emergency Medicine

## 2016-05-12 DIAGNOSIS — J45909 Unspecified asthma, uncomplicated: Secondary | ICD-10-CM | POA: Insufficient documentation

## 2016-05-12 DIAGNOSIS — R0789 Other chest pain: Secondary | ICD-10-CM

## 2016-05-12 MED ORDER — AZITHROMYCIN 250 MG PO TABS: ORAL_TABLET | ORAL | 0 refills | Status: DC

## 2016-05-12 MED ORDER — ALBUTEROL SULFATE HFA 108 (90 BASE) MCG/ACT IN AERS
2.0000 | INHALATION_SPRAY | RESPIRATORY_TRACT | Status: DC | PRN
  Administered 2016-05-12: 2 via RESPIRATORY_TRACT
  Filled 2016-05-12: qty 6.7

## 2016-05-12 MED ORDER — OXYCODONE-ACETAMINOPHEN 5-325 MG PO TABS: 1.0000 | ORAL_TABLET | Freq: Four times a day (QID) | ORAL | 0 refills | Status: DC | PRN

## 2016-05-12 NOTE — ED Provider Notes (Signed)
[facility] Provider Note: Lowella DellJ. Lane Claryssa Sandner, MD, FACEP  CSN: [id number] MRN: [id number] ARRIVAL: 05/12/16 at 0111 ROOM: MH01/MH01   CHIEF COMPLAINT  Chest Pain   HISTORY OF PRESENT ILLNESS  Hayley Webb is a 30 y.o. female with a history of asthma. She is here with a three-day history of chest wall pain. The pain is located parasternally bilaterally. The pain is moderate to severe at its worst. It is exacerbated by breathing and with movement of the chest. It is also tender to palpation. It feels like it radiates into her back. She has also had shortness of breath and occasional cough and is out of her albuterol inhaler. She has been able to use her albuterol neb machine with relief of her shortness of breath. She has not had a fever but has had chills. She checked into [facility] on the 18th and a workup was done per protocol but she had to leave to take care of a sick child. Her laboratory studies were unremarkable but her chest x-ray was suggestive of a left lower lobe infiltrate. She denies nausea, vomiting or diarrhea. She has not taken anything for her chest pain.   Past Medical History:  Diagnosis Date  . Anxiety    doing good now  . Asthma   . Headache(784.0)   . HSV infection   . Infection    UTI  . Lupus    dx age 89  . 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 MOUTH SURGERY     2 teeth removed- 1 wisdom and 1 in front of it  . TONSILLECTOMY AND ADENOIDECTOMY  1998  . WISDOM TOOTH EXTRACTION      Family History  Problem Relation Age of Onset  . Diabetes Maternal Aunt   . Cancer Maternal Grandmother 72    COLON CA  . Diabetes Maternal Grandmother   . Hypertension Maternal Grandfather   . Cancer Maternal Grandfather     prostate  . Asthma Mother   . Asthma Brother   . Cancer Paternal Grandmother     breast  . Anesthesia problems Neg Hx   . Hypotension Neg Hx   . Malignant  hyperthermia Neg Hx   . Pseudochol deficiency Neg Hx     Social History  Substance Use Topics  . Smoking status: Never Smoker  . Smokeless tobacco: Never Used  . Alcohol use No    Prior to Admission medications   Medication Sig Start Date End Date Taking? Authorizing Provider  albuterol (PROVENTIL HFA;VENTOLIN HFA) 108 (90 Base) MCG/ACT inhaler Inhale 2 puffs into the lungs every 6 (six) hours as needed for wheezing or shortness of breath.    Historical Provider, MD  albuterol (PROVENTIL) (2.5 MG/3ML) 0.083% nebulizer solution Take 2.5 mg by nebulization every 6 (six) hours as needed for wheezing or shortness of breath.    Historical Provider, MD  aspirin-acetaminophen-caffeine (EXCEDRIN MIGRAINE) [phone] 6899250-250-65 MG per tablet Take 2 tablets by mouth every 6 (six) hours as needed for headache or migraine.    Historical Provider, MD  Hydroxychloroquine Sulfate (PLAQUENIL PO) Take 100 mg by mouth daily.    Historical Provider, MD  methylPREDNISolone (MEDROL) 2 MG tablet Take 2 mg by mouth daily.    Historical Provider, MD    Allergies Bactrim; Prednisone; Nsaids; Other; Pineapple; Procardia [nifedipine]; and Vicodin [hydrocodone-acetaminophen]   REVIEW OF SYSTEMS  Negative except as noted here or in  the History of Present Illness.   PHYSICAL EXAMINATION  Initial Vital Signs Blood pressure 129/82, pulse 80, temperature 98.1 F (36.7 C), temperature source Oral, resp. rate 21, height 5\' 8"  (1.727 m), weight (!) 345 lb (156.5 kg), last menstrual period 04/07/2016, SpO2 100 %.  Examination General: Well-developed, obese female in no acute distress; appearance consistent with age of record HENT: normocephalic; atraumatic Eyes: pupils equal, round and reactive to light; extraocular muscles intact Neck: supple Heart: regular rate and rhythm Lungs: Mildly decreased air movement bilaterally without wheezing Chest: Bilateral parasternal rib tenderness Abdomen: soft; nondistended; nontender;  bowel sounds present Extremities: No deformity; full range of motion; pulses normal Neurologic: Awake, alert and oriented; motor function intact in all extremities and symmetric; no facial droop Skin: Warm and dry Psychiatric: Normal mood and affect   RESULTS  Summary of this visit's results, reviewed by myself:   EKG Interpretation  Date/Time:  Friday May 12 2016 01:58:52 EDT Ventricular Rate:  91 PR Interval:    QRS Duration: 101 QT Interval:  386 QTC Calculation: 475 R Axis:   30 Text Interpretation:  Sinus rhythm RSR' in V1 or V2, probably normal variant Borderline T abnormalities, anterior leads No significant change was found Confirmed by Saqib Cazarez  MD, Jonny Ruiz ([id number]) on 05/12/2016 6:42:38 AM      Laboratory Studies: No results found for this or any previous visit (from the past 24 hour(s)). Imaging Studies: Dg Chest 2 View  Result Date: 05/10/2016 CLINICAL DATA:  Left chest pain, shortness of breath and productive cough over the last 2 days. EXAM: CHEST  2 VIEW COMPARISON:  09/14/2015 FINDINGS: Heart size is normal. Mediastinal shadows are normal. I think there is hazy infiltrate in the left lower lobe, as evidenced by increased retrocardiac density on the frontal view and decreased spinal gradation on the lateral view. No dense consolidation. No effusions. Bony structures are unremarkable. IMPRESSION: Suspicion of hazy infiltrate in the left lower lobe. While not definite, I think this is probably present. Electronically Signed   By: Paulina Fusi M.D.   On: 05/10/2016 22:19    ED COURSE  Nursing notes and initial vitals signs, including pulse oximetry, reviewed.  Vitals:   05/12/16 0124 05/12/16 0403 05/12/16 0632  BP: (!) 164/108 129/82 133/89  Pulse: 90 80 83  Resp: 18 21 17   Temp: 98.1 F (36.7 C)    TempSrc: Oral    SpO2: 100% 100% 100%  Weight: (!) 345 lb (156.5 kg)    Height: 5\' 8"  (1.727 m)     Chest pain is consistent with costochondritis. Given her  respiratory symptoms and possible infiltrate on chest x-ray with history of lupus will also treat for early pneumonia. We will avoid NSAIDs given her lupus history although she has no evidence of renal insufficiency on lab work.  PROCEDURES    ED DIAGNOSES     ICD-9-CM ICD-10-CM   1. Chest wall pain 786.52 R07.89        Paula Libra, MD 05/12/16 [phone]

## 2016-05-12 NOTE — ED Triage Notes (Signed)
Pt went to ED yesterday for chest pain and SOB, left without being seen, states she looked online and her xray was not clear and she is still having SOB and chest pain and wants to be rechecked, pt in no acute distress in triage, lungs are clear, no fevers

## 2016-07-24 DIAGNOSIS — Z86711 Personal history of pulmonary embolism: Secondary | ICD-10-CM | POA: Insufficient documentation

## 2016-08-03 DIAGNOSIS — Z79899 Other long term (current) drug therapy: Secondary | ICD-10-CM | POA: Diagnosis not present

## 2016-08-03 DIAGNOSIS — M329 Systemic lupus erythematosus, unspecified: Secondary | ICD-10-CM | POA: Diagnosis not present

## 2016-08-04 DIAGNOSIS — J069 Acute upper respiratory infection, unspecified: Secondary | ICD-10-CM | POA: Diagnosis not present

## 2016-08-21 DIAGNOSIS — Z79899 Other long term (current) drug therapy: Secondary | ICD-10-CM | POA: Diagnosis not present

## 2016-09-08 DIAGNOSIS — J45901 Unspecified asthma with (acute) exacerbation: Secondary | ICD-10-CM | POA: Diagnosis not present

## 2016-09-08 DIAGNOSIS — J9801 Acute bronchospasm: Secondary | ICD-10-CM | POA: Diagnosis not present

## 2016-09-20 DIAGNOSIS — R3129 Other microscopic hematuria: Secondary | ICD-10-CM | POA: Diagnosis not present

## 2016-09-20 DIAGNOSIS — R809 Proteinuria, unspecified: Secondary | ICD-10-CM | POA: Insufficient documentation

## 2016-09-20 DIAGNOSIS — R808 Other proteinuria: Secondary | ICD-10-CM | POA: Diagnosis not present

## 2016-09-20 DIAGNOSIS — M329 Systemic lupus erythematosus, unspecified: Secondary | ICD-10-CM | POA: Diagnosis not present

## 2016-09-22 DIAGNOSIS — R319 Hematuria, unspecified: Secondary | ICD-10-CM | POA: Diagnosis not present

## 2016-09-22 DIAGNOSIS — R3129 Other microscopic hematuria: Secondary | ICD-10-CM | POA: Diagnosis not present

## 2016-09-28 DIAGNOSIS — Z79899 Other long term (current) drug therapy: Secondary | ICD-10-CM | POA: Insufficient documentation

## 2016-11-02 DIAGNOSIS — Z124 Encounter for screening for malignant neoplasm of cervix: Secondary | ICD-10-CM | POA: Diagnosis not present

## 2016-11-02 DIAGNOSIS — Z01419 Encounter for gynecological examination (general) (routine) without abnormal findings: Secondary | ICD-10-CM | POA: Diagnosis not present

## 2016-11-02 DIAGNOSIS — R350 Frequency of micturition: Secondary | ICD-10-CM | POA: Diagnosis not present

## 2016-12-20 DIAGNOSIS — M328 Other forms of systemic lupus erythematosus: Secondary | ICD-10-CM | POA: Diagnosis not present

## 2016-12-20 DIAGNOSIS — R062 Wheezing: Secondary | ICD-10-CM | POA: Diagnosis not present

## 2016-12-25 ENCOUNTER — Emergency Department (HOSPITAL_BASED_OUTPATIENT_CLINIC_OR_DEPARTMENT_OTHER): Payer: 59

## 2016-12-25 ENCOUNTER — Observation Stay (HOSPITAL_BASED_OUTPATIENT_CLINIC_OR_DEPARTMENT_OTHER)
Admission: EM | Admit: 2016-12-25 | Discharge: 2016-12-26 | Disposition: A | Discharge status: Home / Self Care | Payer: 59 | Attending: Internal Medicine | Admitting: Internal Medicine

## 2016-12-25 ENCOUNTER — Encounter (HOSPITAL_BASED_OUTPATIENT_CLINIC_OR_DEPARTMENT_OTHER): Payer: Self-pay | Admitting: *Deleted

## 2016-12-25 DIAGNOSIS — E669 Obesity, unspecified: Secondary | ICD-10-CM | POA: Diagnosis not present

## 2016-12-25 DIAGNOSIS — I2699 Other pulmonary embolism without acute cor pulmonale: Principal | ICD-10-CM | POA: Insufficient documentation

## 2016-12-25 DIAGNOSIS — M329 Systemic lupus erythematosus, unspecified: Secondary | ICD-10-CM | POA: Diagnosis not present

## 2016-12-25 DIAGNOSIS — Z886 Allergy status to analgesic agent status: Secondary | ICD-10-CM | POA: Diagnosis not present

## 2016-12-25 DIAGNOSIS — R0602 Shortness of breath: Secondary | ICD-10-CM | POA: Diagnosis present

## 2016-12-25 DIAGNOSIS — Z6841 Body Mass Index (BMI) 40.0 and over, adult: Secondary | ICD-10-CM | POA: Diagnosis not present

## 2016-12-25 DIAGNOSIS — Z885 Allergy status to narcotic agent status: Secondary | ICD-10-CM | POA: Diagnosis not present

## 2016-12-25 DIAGNOSIS — Z91013 Allergy to seafood: Secondary | ICD-10-CM | POA: Diagnosis not present

## 2016-12-25 DIAGNOSIS — Z883 Allergy status to other anti-infective agents status: Secondary | ICD-10-CM | POA: Diagnosis not present

## 2016-12-25 DIAGNOSIS — Z79899 Other long term (current) drug therapy: Secondary | ICD-10-CM | POA: Insufficient documentation

## 2016-12-25 DIAGNOSIS — Z7952 Long term (current) use of systemic steroids: Secondary | ICD-10-CM | POA: Insufficient documentation

## 2016-12-25 DIAGNOSIS — D638 Anemia in other chronic diseases classified elsewhere: Secondary | ICD-10-CM | POA: Diagnosis not present

## 2016-12-25 DIAGNOSIS — Z91018 Allergy to other foods: Secondary | ICD-10-CM | POA: Insufficient documentation

## 2016-12-25 DIAGNOSIS — F419 Anxiety disorder, unspecified: Secondary | ICD-10-CM | POA: Diagnosis not present

## 2016-12-25 DIAGNOSIS — D6862 Lupus anticoagulant syndrome: Secondary | ICD-10-CM | POA: Diagnosis not present

## 2016-12-25 DIAGNOSIS — R0789 Other chest pain: Secondary | ICD-10-CM | POA: Insufficient documentation

## 2016-12-25 DIAGNOSIS — R079 Chest pain, unspecified: Secondary | ICD-10-CM | POA: Diagnosis not present

## 2016-12-25 DIAGNOSIS — G43909 Migraine, unspecified, not intractable, without status migrainosus: Secondary | ICD-10-CM | POA: Diagnosis not present

## 2016-12-25 LAB — BASIC METABOLIC PANEL
ANION GAP: 10 (ref 5–15)
BUN: 9 mg/dL (ref 6–20)
CALCIUM: 9.2 mg/dL (ref 8.9–10.3)
CO2: 24 mmol/L (ref 22–32)
CREATININE: 0.76 mg/dL (ref 0.44–1.00)
Chloride: 104 mmol/L (ref 101–111)
GFR calc Af Amer: >60 mL/min (ref >60)
Glucose, Bld: 94 mg/dL (ref 65–99)
Potassium: 3.7 mmol/L (ref 3.5–5.1)
Sodium: 138 mmol/L (ref 135–145)

## 2016-12-25 LAB — PREGNANCY, URINE: Preg Test, Ur: NEGATIVE

## 2016-12-25 LAB — TROPONIN I: Troponin I: <0.03 ng/mL (ref <0.03)

## 2016-12-25 LAB — CBC
HCT: 37.7 % (ref 36.0–46.0)
HEMOGLOBIN: 12.4 g/dL (ref 12.0–15.0)
MCH: 27.3 pg (ref 26.0–34.0)
MCHC: 32.9 g/dL (ref 30.0–36.0)
MCV: 82.9 fL (ref 78.0–100.0)
PLATELETS: 399 10*3/uL (ref 150–400)
RBC: 4.55 MIL/uL (ref 3.87–5.11)
RDW: 14.4 % (ref 11.5–15.5)
WBC: 5.8 10*3/uL (ref 4.0–10.5)

## 2016-12-25 LAB — D-DIMER, QUANTITATIVE (NOT AT ARMC): D DIMER QUANT: 1.05 ug{FEU}/mL — AB (ref 0.00–0.50)

## 2016-12-25 NOTE — ED Notes (Signed)
Chest pain for a week 

## 2016-12-25 NOTE — ED Triage Notes (Signed)
Chest pain x 2 weeks. States she was treated by a MD at [facility] for the flu. Went back to her MD today and was told to come here due to continued pain.

## 2016-12-26 ENCOUNTER — Emergency Department (HOSPITAL_COMMUNITY): Payer: 59

## 2016-12-26 ENCOUNTER — Observation Stay (HOSPITAL_BASED_OUTPATIENT_CLINIC_OR_DEPARTMENT_OTHER): Payer: 59

## 2016-12-26 ENCOUNTER — Encounter (HOSPITAL_COMMUNITY): Payer: Self-pay | Admitting: Radiology

## 2016-12-26 ENCOUNTER — Observation Stay (HOSPITAL_COMMUNITY): Payer: 59

## 2016-12-26 DIAGNOSIS — I2690 Septic pulmonary embolism without acute cor pulmonale: Secondary | ICD-10-CM

## 2016-12-26 DIAGNOSIS — Z6841 Body Mass Index (BMI) 40.0 and over, adult: Secondary | ICD-10-CM

## 2016-12-26 DIAGNOSIS — Z7952 Long term (current) use of systemic steroids: Secondary | ICD-10-CM

## 2016-12-26 DIAGNOSIS — L930 Discoid lupus erythematosus: Secondary | ICD-10-CM | POA: Diagnosis not present

## 2016-12-26 DIAGNOSIS — Z86711 Personal history of pulmonary embolism: Secondary | ICD-10-CM | POA: Diagnosis not present

## 2016-12-26 DIAGNOSIS — I2699 Other pulmonary embolism without acute cor pulmonale: Secondary | ICD-10-CM | POA: Diagnosis present

## 2016-12-26 DIAGNOSIS — R0602 Shortness of breath: Secondary | ICD-10-CM

## 2016-12-26 DIAGNOSIS — J45909 Unspecified asthma, uncomplicated: Secondary | ICD-10-CM | POA: Diagnosis present

## 2016-12-26 DIAGNOSIS — E876 Hypokalemia: Secondary | ICD-10-CM | POA: Diagnosis present

## 2016-12-26 DIAGNOSIS — Z825 Family history of asthma and other chronic lower respiratory diseases: Secondary | ICD-10-CM

## 2016-12-26 DIAGNOSIS — R067 Sneezing: Secondary | ICD-10-CM | POA: Diagnosis not present

## 2016-12-26 DIAGNOSIS — R0600 Dyspnea, unspecified: Secondary | ICD-10-CM | POA: Diagnosis not present

## 2016-12-26 LAB — CBC WITH DIFFERENTIAL/PLATELET
BASOS PCT: 1 %
Basophils Absolute: 0 10*3/uL (ref 0.0–0.1)
Eosinophils Absolute: 0.3 10*3/uL (ref 0.0–0.7)
Eosinophils Relative: 4 %
HEMATOCRIT: 35.6 % — AB (ref 36.0–46.0)
Hemoglobin: 11.6 g/dL — ABNORMAL LOW (ref 12.0–15.0)
LYMPHS ABS: 4.1 10*3/uL — AB (ref 0.7–4.0)
LYMPHS PCT: 57 %
MCH: 27 pg (ref 26.0–34.0)
MCHC: 32.6 g/dL (ref 30.0–36.0)
MCV: 83 fL (ref 78.0–100.0)
MONO ABS: 0.3 10*3/uL (ref 0.1–1.0)
MONOS PCT: 5 %
NEUTROS ABS: 2.4 10*3/uL (ref 1.7–7.7)
Neutrophils Relative %: 33 %
Platelets: 368 10*3/uL (ref 150–400)
RBC: 4.29 MIL/uL (ref 3.87–5.11)
RDW: 14.3 % (ref 11.5–15.5)
WBC: 7.1 10*3/uL (ref 4.0–10.5)

## 2016-12-26 LAB — PROTIME-INR
INR: 1.03
Prothrombin Time: 13.5 seconds (ref 11.4–15.2)

## 2016-12-26 LAB — BASIC METABOLIC PANEL
Anion gap: 8 (ref 5–15)
BUN: 10 mg/dL (ref 6–20)
CALCIUM: 9.5 mg/dL (ref 8.9–10.3)
CO2: 26 mmol/L (ref 22–32)
Chloride: 104 mmol/L (ref 101–111)
Creatinine, Ser: 0.72 mg/dL (ref 0.44–1.00)
GFR calc Af Amer: >60 mL/min (ref >60)
GFR calc non Af Amer: >60 mL/min (ref >60)
GLUCOSE: 99 mg/dL (ref 65–99)
Potassium: 4 mmol/L (ref 3.5–5.1)
Sodium: 138 mmol/L (ref 135–145)

## 2016-12-26 LAB — BRAIN NATRIURETIC PEPTIDE: B Natriuretic Peptide: 13.2 pg/mL (ref 0.0–100.0)

## 2016-12-26 LAB — TROPONIN I: Troponin I: <0.03 ng/mL (ref <0.03)

## 2016-12-26 LAB — LACTIC ACID, PLASMA: LACTIC ACID, VENOUS: 0.8 mmol/L (ref 0.5–1.9)

## 2016-12-26 LAB — APTT: aPTT: 31 seconds (ref 24–36)

## 2016-12-26 LAB — HIV ANTIBODY (ROUTINE TESTING W REFLEX): HIV Screen 4th Generation wRfx: NONREACTIVE

## 2016-12-26 MED ORDER — ALBUTEROL SULFATE HFA 108 (90 BASE) MCG/ACT IN AERS: 2.0000 | INHALATION_SPRAY | Freq: Four times a day (QID) | RESPIRATORY_TRACT | Status: DC | PRN

## 2016-12-26 MED ORDER — HYDROXYCHLOROQUINE SULFATE 200 MG PO TABS
200.0000 mg | ORAL_TABLET | Freq: Every day | ORAL | Status: DC
  Administered 2016-12-26: 200 mg via ORAL
  Filled 2016-12-26: qty 1

## 2016-12-26 MED ORDER — SODIUM CHLORIDE 0.9 % IV SOLN
INTRAVENOUS | Status: DC
  Administered 2016-12-26: 07:00:00 via INTRAVENOUS

## 2016-12-26 MED ORDER — IOPAMIDOL (ISOVUE-370) INJECTION 76%
INTRAVENOUS | Status: AC
  Administered 2016-12-26: 100 mL via INTRAVENOUS
  Filled 2016-12-26: qty 100

## 2016-12-26 MED ORDER — ENOXAPARIN SODIUM 150 MG/ML ~~LOC~~ SOLN
150.0000 mg | SUBCUTANEOUS | Status: AC
  Administered 2016-12-26: 150 mg via SUBCUTANEOUS
  Filled 2016-12-26: qty 1

## 2016-12-26 MED ORDER — ALBUTEROL SULFATE (2.5 MG/3ML) 0.083% IN NEBU
2.5000 mg | INHALATION_SOLUTION | Freq: Four times a day (QID) | RESPIRATORY_TRACT | Status: DC | PRN
Start: 2016-12-26 — End: 2016-12-26

## 2016-12-26 MED ORDER — RIVAROXABAN 15 MG PO TABS: 15.0000 mg | ORAL_TABLET | Freq: Two times a day (BID) | ORAL | Status: DC

## 2016-12-26 MED ORDER — ONDANSETRON HCL 4 MG PO TABS: 4.0000 mg | ORAL_TABLET | Freq: Four times a day (QID) | ORAL | Status: DC | PRN

## 2016-12-26 MED ORDER — METHYLPREDNISOLONE 2 MG PO TABS
4.0000 mg | ORAL_TABLET | Freq: Every day | ORAL | Status: DC
  Administered 2016-12-26: 4 mg via ORAL
  Filled 2016-12-26: qty 2

## 2016-12-26 MED ORDER — RIVAROXABAN 20 MG PO TABS: 20.0000 mg | ORAL_TABLET | Freq: Every day | ORAL | 1 refills | Status: DC

## 2016-12-26 MED ORDER — ENOXAPARIN SODIUM 100 MG/ML ~~LOC~~ SOLN
100.0000 mg | Freq: Two times a day (BID) | SUBCUTANEOUS | Status: DC
  Administered 2016-12-26: 100 mg via SUBCUTANEOUS
  Filled 2016-12-26: qty 1

## 2016-12-26 MED ORDER — ONDANSETRON HCL 4 MG/2ML IJ SOLN: 4.0000 mg | Freq: Four times a day (QID) | INTRAMUSCULAR | Status: DC | PRN

## 2016-12-26 MED ORDER — ENOXAPARIN SODIUM 60 MG/0.6ML ~~LOC~~ SOLN
55.0000 mg | Freq: Two times a day (BID) | SUBCUTANEOUS | Status: DC
  Administered 2016-12-26: 55 mg via SUBCUTANEOUS
  Filled 2016-12-26: qty 0.6

## 2016-12-26 MED ORDER — RIVAROXABAN (XARELTO) VTE STARTER PACK (15 & 20 MG): ORAL_TABLET | ORAL | 0 refills | Status: DC

## 2016-12-26 MED ORDER — MORPHINE SULFATE (PF) 2 MG/ML IV SOLN
2.0000 mg | INTRAVENOUS | Status: DC | PRN
  Administered 2016-12-26 (×2): 2 mg via INTRAVENOUS
  Filled 2016-12-26 (×2): qty 1

## 2016-12-26 MED ORDER — IOPAMIDOL (ISOVUE-370) INJECTION 76%
100.0000 mL | Freq: Once | INTRAVENOUS | Status: AC | PRN
  Administered 2016-12-26: 100 mL via INTRAVENOUS

## 2016-12-26 NOTE — Progress Notes (Signed)
ANTICOAGULATION CONSULT NOTE - Initial Consult  Pharmacy Consult for rivaroxaban Indication: PE  Allergies  Allergen Reactions  . Bactrim Anaphylaxis and Hives  . Prednisone Anaphylaxis and Hives    Has been on Dexamethasone without issue.  . Nsaids     Has Lupus, reccommended to avoid NSAIDs  . Other Itching    Shrimp:throat itching "can eat other shellfish"  . Pineapple Itching  . Procardia [Nifedipine] Swelling    Swelling of tongue  . Vicodin [Hydrocodone-Acetaminophen] Hives and Swelling    Can take Percocet without difficulty    Patient Measurements: Height: 5\' 9"  (175.3 cm) Weight: (!) 337 lb 11.9 oz (153.2 kg) IBW/kg (Calculated) : 66.2 Heparin Dosing Weight:   Vital Signs: Temp: 98 F (36.7 C) (06/05 1500) Temp Source: Oral (06/05 1500) BP: 135/82 (06/05 1500) Pulse Rate: 80 (06/05 1500)  Labs:  Recent Labs  12/25/16 1830 12/25/16 2140 12/26/16 0401 12/26/16 0749  HGB 12.4  --   --  11.6*  HCT 37.7  --   --  35.6*  PLT 399  --   --  368  APTT  --   --  31  --   LABPROT  --   --  13.5  --   INR  --   --  1.03  --   CREATININE 0.76  --   --  0.72  TROPONINI <0.03 <0.03  --  <0.03    Estimated Creatinine Clearance: 166.9 mL/min (by C-G formula based on SCr of 0.72 mg/dL).   Medical History: Past Medical History:  Diagnosis Date  . Anxiety    doing good now  . Asthma   . Headache(784.0)   . HSV infection   . Infection    UTI  . Lupus    dx age 31  . STD (sexually transmitted disease) 02/2009   POSITIVE GC    Medications:  Scheduled:  . hydroxychloroquine  200 mg Oral Daily  . methylPREDNISolone  4 mg Oral Daily  . [START ON 12/27/2016] rivaroxaban  15 mg Oral BID WC    Assessment: Pharmacy is consulted to dose Xarelto in 31 yo female diagnosed with PE. Pt to be discharged today  Goal of Therapy:  Monitor platelets by anticoagulation protocol: Yes   Plan:   Xarelto 15 mg PO BId with meals x 21 days, then 20 mg PO daily with  meal  Monitor for signs and symptoms of bleeding  Patient education provided.  Patient information provided.    Adalberto ColeNikola Eather Chaires, PharmD, BCPS Pager [phone] 12/26/2016 5:02 PM

## 2016-12-26 NOTE — ED Notes (Addendum)
Peripheral IV to Left a/c is secured with kerlix.  Pt is going to [facility] ED, report given to [place].

## 2016-12-26 NOTE — ED Provider Notes (Signed)
Patient with chest pain 1-2 weeks. She reports that the pain is left-sided. She reports mild associated shortness of breath. She also reports bilateral lower extremity swelling. She denies any history of PE or DVT. Denies any recent travel, surgery, or birth control.     Patient has lupus.  Patient sent from [facility] for CT PE study.  CT scan shows small PE with mild right heart strain. Will start Lovenox and admit to medicine.  CRITICAL CARE Performed by: Roxy HorsemanBROWNING, Zayan Delvecchio   Total critical care time: 31 minutes  Critical care time was exclusive of separately billable procedures and treating other patients.  Critical care was necessary to treat or prevent imminent or life-threatening deterioration.  Critical care was time spent personally by me on the following activities: development of treatment plan with patient and/or surrogate as well as nursing, discussions with consultants, evaluation of patient's response to treatment, examination of patient, obtaining history from patient or surrogate, ordering and performing treatments and interventions, ordering and review of laboratory studies, ordering and review of radiographic studies, pulse oximetry and re-evaluation of patient's condition.        Roxy HorsemanBrowning, Philemon Riedesel, PA-C 12/26/16 0401    Shon BatonHorton, Courtney F, MD 12/26/16 [phone]

## 2016-12-26 NOTE — H&P (Signed)
History and Physical    Hayley Webb ZOX:[id number] DOB: Jul 21, 1986 DOA: 12/25/2016  PCP: System, Pcp Not In  Patient coming from: Home.  Chief Complaint: Shortness of breath and chest pain.  HPI: Hayley Webb is a 31 y.o. female with history of systemic lupus erythematosus presents with complaints of shortness of breath and pleuritic-type of chest pain over the last 1 week. Patient has increasing shortness of breath on exertion. Denies any productive cough fever or chills.   ED Course: CT angiogram of the chest in the ER shows pulmonary embolism with borderline RV strain. Patient was started on Lovenox and admitted for further observation. Patient denies using any contraceptive pills or has not had any previous episodes of pulmonary embolism. No family history of pulmonary embolism. Denies any recent travel or surgery.  Review of Systems: As per HPI, rest all negative.   Past Medical History:  Diagnosis Date  . Anxiety    doing good now  . Asthma   . Headache(784.0)   . HSV infection   . Infection    UTI  . Lupus    dx age 89  . 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 MOUTH SURGERY     2 teeth removed- 1 wisdom and 1 in front of it  . TONSILLECTOMY AND ADENOIDECTOMY  1998  . WISDOM TOOTH EXTRACTION       reports that she has never smoked. She has never used smokeless tobacco. She reports that she does not drink alcohol or use drugs.  Allergies  Allergen Reactions  . Bactrim Anaphylaxis and Hives  . Prednisone Anaphylaxis and Hives    Has been on Dexamethasone without issue.  . Nsaids     Has Lupus, reccommended to avoid NSAIDs  . Other Itching    Shrimp:throat itching "can eat other shellfish"  . Pineapple Itching  . Procardia [Nifedipine] Swelling    Swelling of tongue  . Vicodin [Hydrocodone-Acetaminophen] Hives and Swelling    Can take Percocet without difficulty    Family History   Problem Relation Age of Onset  . Diabetes Maternal Aunt   . Cancer Maternal Grandmother 72       COLON CA  . Diabetes Maternal Grandmother   . Hypertension Maternal Grandfather   . Cancer Maternal Grandfather        prostate  . Asthma Mother   . Asthma Brother   . Cancer Paternal Grandmother        breast  . Anesthesia problems Neg Hx   . Hypotension Neg Hx   . Malignant hyperthermia Neg Hx   . Pseudochol deficiency Neg Hx     Prior to Admission medications   Medication Sig Start Date End Date Taking? Authorizing Provider  albuterol (PROVENTIL HFA;VENTOLIN HFA) 108 (90 Base) MCG/ACT inhaler Inhale 2 puffs into the lungs every 6 (six) hours as needed for wheezing or shortness of breath.   Yes [provider]  albuterol (PROVENTIL) (2.5 MG/3ML) 0.083% nebulizer solution Take 2.5 mg by nebulization every 6 (six) hours as needed for wheezing or shortness of breath.   Yes [provider]  aspirin-acetaminophen-caffeine (EXCEDRIN MIGRAINE) [phone] MG per tablet Take 2 tablets by mouth every 6 (six) hours as needed for headache or migraine.   Yes [provider]  azithromycin (ZITHROMAX Z-PAK) 250 MG tablet 2 po day one, then 1 daily x 4 days 05/12/16  Yes Molpus, Jonny RuizJohn, MD  Hydroxychloroquine Sulfate (PLAQUENIL PO) Take 200 mg by mouth daily.    Yes [provider]  methylPREDNISolone (MEDROL) 2 MG tablet Take 4 mg by mouth daily.    Yes [provider]  Multiple Vitamins-Minerals (MULTIVITAMIN ADULT PO) Take 1 tablet by mouth daily.   Yes [provider]  oseltamivir (TAMIFLU) 75 MG capsule Take 75 mg by mouth daily.   Yes [provider]  oxyCODONE-acetaminophen (PERCOCET) 5-325 MG tablet Take 1-2 tablets by mouth every 6 (six) hours as needed. Patient not taking: Reported on 12/26/2016 05/12/16   Paula Libra, MD    Physical Exam: Vitals:   12/25/16 2026 12/25/16 2302 12/25/16 2351 12/26/16 0520  BP: (!) 126/94 140/78  134/86 (!) 145/108  Pulse: 83 85 83 80  Resp: (!) 23 18 20 20   Temp:   98.3 F (36.8 C) 97.6 F (36.4 C)  TempSrc:   Oral Oral  SpO2: 100% 100% 100%   Weight:    (!) 153.2 kg (337 lb 11.9 oz)  Height:    5\' 9"  (1.753 m)      Constitutional: Moderately built and nourished. Vitals:   12/25/16 2026 12/25/16 2302 12/25/16 2351 12/26/16 0520  BP: (!) 126/94 140/78 134/86 (!) 145/108  Pulse: 83 85 83 80  Resp: (!) 23 18 20 20   Temp:   98.3 F (36.8 C) 97.6 F (36.4 C)  TempSrc:   Oral Oral  SpO2: 100% 100% 100%   Weight:    (!) 153.2 kg (337 lb 11.9 oz)  Height:    5\' 9"  (1.753 m)   Eyes: Anicteric no pallor. ENMT: No discharge from the ears eyes nose or mouth. Neck: No mass felt. No JVD appreciated. Respiratory: No rhonchi or crepitations. Cardiovascular: S1-S2 heard no murmurs appreciated. Abdomen: Soft nontender bowel sounds present. Musculoskeletal: No edema. No joint effusion. Skin: No rash. Skin appears warm. Neurologic: Alert awake oriented to time place and person. Moves all extremities. Psychiatric: Appears normal. Normal affect.   Labs on Admission: I have personally reviewed following labs and imaging studies  CBC:  Recent Labs Lab 12/25/16 1830  WBC 5.8  HGB 12.4  HCT 37.7  MCV 82.9  PLT 399   Basic Metabolic Panel:  Recent Labs Lab 12/25/16 1830  NA 138  K 3.7  CL 104  CO2 24  GLUCOSE 94  BUN 9  CREATININE 0.76  CALCIUM 9.2   GFR: Estimated Creatinine Clearance: 166.9 mL/min (by C-G formula based on SCr of 0.76 mg/dL). Liver Function Tests: No results for input(s): AST, ALT, ALKPHOS, BILITOT, PROT, ALBUMIN in the last 168 hours. No results for input(s): LIPASE, AMYLASE in the last 168 hours. No results for input(s): AMMONIA in the last 168 hours. Coagulation Profile:  Recent Labs Lab 12/26/16 0401  INR 1.03   Cardiac Enzymes:  Recent Labs Lab 12/25/16 1830 12/25/16 2140  TROPONINI <0.03 <0.03   BNP (last 3 results) No  results for input(s): PROBNP in the last 8760 hours. HbA1C: No results for input(s): HGBA1C in the last 72 hours. CBG: No results for input(s): GLUCAP in the last 168 hours. Lipid Profile: No results for input(s): CHOL, HDL, LDLCALC, TRIG, CHOLHDL, LDLDIRECT in the last 72 hours. Thyroid Function Tests: No results for input(s): TSH, T4TOTAL, FREET4, T3FREE, THYROIDAB in the last 72 hours. Anemia Panel: No results for input(s): VITAMINB12, FOLATE, FERRITIN, TIBC, IRON, RETICCTPCT in the last 72 hours. Urine analysis:    Component Value Date/Time   COLORURINE YELLOW 05/04/2016 2305  APPEARANCEUR CLEAR 05/04/2016 2305   LABSPEC >1.030 (H) 05/04/2016 2305   PHURINE 6.0 05/04/2016 2305   GLUCOSEU NEGATIVE 05/04/2016 2305   HGBUR MODERATE (A) 05/04/2016 2305   BILIRUBINUR NEGATIVE 05/04/2016 2305   KETONESUR NEGATIVE 05/04/2016 2305   PROTEINUR NEGATIVE 05/04/2016 2305   UROBILINOGEN 1.0 04/28/2015 1930   NITRITE NEGATIVE 05/04/2016 2305   LEUKOCYTESUR NEGATIVE 05/04/2016 2305   Sepsis Labs: @LABRCNTIP (procalcitonin:4,lacticidven:4) )No results found for this or any previous visit (from the past 240 hour(s)).   Radiological Exams on Admission: Dg Chest 2 View  Result Date: 12/25/2016 CLINICAL DATA:  Chest pain and shortness of breath. EXAM: CHEST  2 VIEW COMPARISON:  05/10/2016 FINDINGS: The heart size and mediastinal contours are within normal limits. Both lungs are clear. The visualized skeletal structures are unremarkable. IMPRESSION: No active cardiopulmonary disease. Electronically Signed   By: Francene Boyers M.D.   On: 12/25/2016 18:49   Ct Angio Chest Pe W Or Wo Contrast  Result Date: 12/26/2016 CLINICAL DATA:  Chest pain x1 week with dyspnea.  Positive D-dimer. EXAM: CT ANGIOGRAPHY CHEST WITH CONTRAST TECHNIQUE: Multidetector CT imaging of the chest was performed using the standard protocol during bolus administration of intravenous contrast. Multiplanar CT image  reconstructions and MIPs were obtained to evaluate the vascular anatomy. CONTRAST:  100 cc Isovue 370 COMPARISON:  None. FINDINGS: Cardiovascular: Study is limited by patient body habitus. There for tiny pulmonary emboli to the lower lobes representative image series 5, image 59 for example. No large central pulmonary embolus. RV/LV ratio equals 0.74. No pericardial effusion. Top normal size cardiac chambers. No aortic aneurysm or dissection. Mediastinum/Nodes: No enlarged mediastinal, hilar, or axillary lymph nodes. Thyroid gland, trachea, and esophagus demonstrate no significant findings. Lungs/Pleura: Lungs are clear. No pleural effusion or pneumothorax. Upper Abdomen: No acute abnormality. Musculoskeletal: No chest wall abnormality. No acute or significant osseous findings. Review of the MIP images confirms the above findings. IMPRESSION: Tiny nonocclusive pulmonary emboli noted to the lower lobes. Borderline right heart strain with RV/LV ratio 0.74. Critical Value/emergent results were called by telephone at the time of interpretation on 12/26/2016 at 3:02 am to clinician Ivar Drape, who verbally acknowledged these results. Electronically Signed   By: Tollie Eth M.D.   On: 12/26/2016 03:02    EKG: Independently reviewed. Normal sinus rhythm.  Assessment/Plan Principal Problem:   Pulmonary embolism (HCC) Active Problems:   Lupus    1. Pulmonary embolism unprovoked - since patient's CAT scan shows possible borderline RV strain we will check 2-D echo and continue to monitor in telemetry check troponin and BNP and lactate levels. Patient is on Lovenox dose per pharmacy. May change to oral anticoagulants if patient remains stable. Check Dopplers of the lower extremity. 2. History of lupus on steroids and Plaquenil. 3. Anemia probably related to lupus. Follow CBC.   DVT prophylaxis: Lovenox. Code Status: Full code.  Family Communication: Patient's sister.  Disposition Plan: Home.  Consults  called: None.  Admission status: Observation.    Eduard Clos MD [facility] Pager [phone].  If 7PM-7AM, please contact night-coverage [email or web address] Password [facility]  12/26/2016, 6:24 AM

## 2016-12-26 NOTE — Progress Notes (Addendum)
Pt is aware of her co-pay for Eliquis is $100.00 and Xarelto is $60.00. Pt states that she do not have a PCP. Offered her choices which she asked that an Appointment be made for her.  [facility] on Monday June 11 at 2 PM. Pt is aware.

## 2016-12-26 NOTE — Discharge Summary (Signed)
Physician Discharge Summary  Hayley Webb WUJ:[id number] DOB: 1985/12/07 DOA: 12/25/2016  PCP: System, Pcp Not In  Admit date: 12/25/2016 Discharge date: 12/26/2016  Time spent: 30 minutes  Recommendations for Outpatient Follow-up:  1. Repeat CBC to follow Hgb trend    Discharge Diagnoses:  Principal Problem:   Pulmonary embolism (HCC) Active Problems:   Lupus obesity Migraine Anemia of chronic disease   Discharge Condition: stable and improved. Will discharge home with treatment for PE with xarelto. Will arrange follow up with [facility] clinic to establish care with PCP  Diet recommendation: low calorie diet   Filed Weights   12/25/16 1818 12/26/16 0520  Weight: (!) 153.8 kg (339 lb) (!) 153.2 kg (337 lb 11.9 oz)    History of present illness:  As per H&P written by Dr. Toniann Webb on 12/26/16 31 y.o. female with history of systemic lupus erythematosus presents with complaints of shortness of breath and pleuritic-type of chest pain over the last 1 week. Patient has increasing shortness of breath on exertion. Denies any productive cough fever or chills.   ED Course: CT angiogram of the chest in the ER shows pulmonary embolism with borderline RV strain. Patient was started on Lovenox and admitted for further observation. Patient denies using any contraceptive pills or has not had any previous episodes of pulmonary embolism. No family history of pulmonary embolism.  Hospital Course:  1-Pulmonary embolism -patient with pleuritic chest pain, SOB and hypercoagulable state from Lupus -positive CTA -no hypoxia, hemodynamically stable -neg LE duplex -will discharge on xarelto; at least for 12 months, if not indefinitely  -continue PRN oxycodone for pain  2-lupus -will continue steroids and plaquenil -continue outpatient follow up with rheumatologist   3-obesity -Body mass index is 49.88 kg/m. -low calorie diet and exercise  4-hx of migraine -continue PRN tylenol -advise to  avoid NSAID's, as patient now on anticoagulation   5-anemia of chronic disease  -most likely from Lupus -will recommend CBC as an outpatient to follow Hgb trend    Procedures:  Lower extremities duplex: negative for bilateral DVT, no Baker's cyst, no SVT.    Consultations:  None   Discharge Exam: Vitals:   12/25/16 2351 12/26/16 0520  BP: 134/86 (!) 145/108  Pulse: 83 80  Resp: 20 20  Temp: 98.3 F (36.8 C) 97.6 F (36.4 C)    General: Obese, afebrile, mild chest discomfort with movement and deep breath; able to speak in full sentences and with good O2 sat. No tachypnea and no tachycardia. Cardiovascular: S1 and S2, no rubs, no gallops, no murmurs Respiratory: no wheezing, no rhonchi, good air movement, no using accessory muscles  Abd: obese, soft, NT, ND, positive BS Extremities: no edema, no cyanosis, some discomfort with palpation   Discharge Instructions   Discharge Instructions    Discharge instructions    Complete by:  As directed    Take medications as prescribed Follow up as instructed for follow up and to establish care with PCP Keep yourself well hydrated  Avoid the use of NSAID's Follow low calorie diet and lose weight     Current Discharge Medication List    START taking these medications   Details  rivaroxaban (XARELTO) 20 MG TABS tablet Take 1 tablet (20 mg total) by mouth daily with supper. Qty: 30 tablet, Refills: 1    Rivaroxaban 15 & 20 MG TBPK Take as directed on package: Start with one 15mg  tablet by mouth twice a day with food. On Day 22, switch to one 20mg   tablet once a day with food. Qty: 51 each, Refills: 0      CONTINUE these medications which have NOT CHANGED   Details  albuterol (PROVENTIL HFA;VENTOLIN HFA) 108 (90 Base) MCG/ACT inhaler Inhale 2 puffs into the lungs every 6 (six) hours as needed for wheezing or shortness of breath.    albuterol (PROVENTIL) (2.5 MG/3ML) 0.083% nebulizer solution Take 2.5 mg by nebulization every 6  (six) hours as needed for wheezing or shortness of breath.    Hydroxychloroquine Sulfate (PLAQUENIL PO) Take 200 mg by mouth daily.     methylPREDNISolone (MEDROL) 2 MG tablet Take 4 mg by mouth daily.     Multiple Vitamins-Minerals (MULTIVITAMIN ADULT PO) Take 1 tablet by mouth daily.    oxyCODONE-acetaminophen (PERCOCET) 5-325 MG tablet Take 1-2 tablets by mouth every 6 (six) hours as needed. Qty: 10 tablet, Refills: 0      STOP taking these medications     aspirin-acetaminophen-caffeine (EXCEDRIN MIGRAINE) 250-250-65 MG per tablet      azithromycin (ZITHROMAX Z-PAK) 250 MG tablet      oseltamivir (TAMIFLU) 75 MG capsule        Allergies  Allergen Reactions  . Bactrim Anaphylaxis and Hives  . Prednisone Anaphylaxis and Hives    Has been on Dexamethasone without issue.  . Nsaids     Has Lupus, reccommended to avoid NSAIDs  . Other Itching    Shrimp:throat itching "can eat other shellfish"  . Pineapple Itching  . Procardia [Nifedipine] Swelling    Swelling of tongue  . Vicodin [Hydrocodone-Acetaminophen] Hives and Swelling    Can take Percocet without difficulty   Follow-up Information    [facility] Follow up on 01/01/2017.   Specialty:  Internal Medicine Why:  Appointment Monday 01/01/2017 at 2:00 PM.  Contact information: [address] [place] [address] [phone]          The results of significant diagnostics from this hospitalization (including imaging, microbiology, ancillary and laboratory) are listed below for reference.    Significant Diagnostic Studies: Dg Chest 2 View  Result Date: 12/25/2016 CLINICAL DATA:  Chest pain and shortness of breath. EXAM: CHEST  2 VIEW COMPARISON:  05/10/2016 FINDINGS: The heart size and mediastinal contours are within normal limits. Both lungs are clear. The visualized skeletal structures are unremarkable. IMPRESSION: No active cardiopulmonary disease. Electronically Signed   By:  Francene Boyers M.D.   On: 12/25/2016 18:49   Ct Angio Chest Pe W Or Wo Contrast  Result Date: 12/26/2016 CLINICAL DATA:  Chest pain x1 week with dyspnea.  Positive D-dimer. EXAM: CT ANGIOGRAPHY CHEST WITH CONTRAST TECHNIQUE: Multidetector CT imaging of the chest was performed using the standard protocol during bolus administration of intravenous contrast. Multiplanar CT image reconstructions and MIPs were obtained to evaluate the vascular anatomy. CONTRAST:  100 cc Isovue 370 COMPARISON:  None. FINDINGS: Cardiovascular: Study is limited by patient body habitus. There for tiny pulmonary emboli to the lower lobes representative image series 5, image 59 for example. No large central pulmonary embolus. RV/LV ratio equals 0.74. No pericardial effusion. Top normal size cardiac chambers. No aortic aneurysm or dissection. Mediastinum/Nodes: No enlarged mediastinal, hilar, or axillary lymph nodes. Thyroid gland, trachea, and esophagus demonstrate no significant findings. Lungs/Pleura: Lungs are clear. No pleural effusion or pneumothorax. Upper Abdomen: No acute abnormality. Musculoskeletal: No chest wall abnormality. No acute or significant osseous findings. Review of the MIP images confirms the above findings. IMPRESSION: Tiny nonocclusive pulmonary emboli noted  to the lower lobes. Borderline right heart strain with RV/LV ratio 0.74. Critical Value/emergent results were called by telephone at the time of interpretation on 12/26/2016 at 3:02 am to clinician Ivar Drapeob Browning, who verbally acknowledged these results. Electronically Signed   By: Tollie Ethavid  Kwon M.D.   On: 12/26/2016 03:02    Microbiology: No results found for this or any previous visit (from the past 240 hour(s)).   Labs: Basic Metabolic Panel:  Recent Labs Lab 12/25/16 1830 12/26/16 0749  NA 138 138  K 3.7 4.0  CL 104 104  CO2 24 26  GLUCOSE 94 99  BUN 9 10  CREATININE 0.76 0.72  CALCIUM 9.2 9.5   CBC:  Recent Labs Lab 12/25/16 1830  12/26/16 0749  WBC 5.8 7.1  NEUTROABS  --  2.4  HGB 12.4 11.6*  HCT 37.7 35.6*  MCV 82.9 83.0  PLT 399 368   Cardiac Enzymes:  Recent Labs Lab 12/25/16 1830 12/25/16 2140 12/26/16 0749  TROPONINI <0.03 <0.03 <0.03   BNP: BNP (last 3 results)  Recent Labs  12/26/16 0749  BNP 13.2    Signed:  Vassie LollMadera, Gopal Malter MD.  [facility] 12/26/2016, 3:46 PM

## 2016-12-26 NOTE — Progress Notes (Signed)
ANTICOAGULATION CONSULT NOTE - Initial Consult  Pharmacy Consult for Enoxaparin Indication: pulmonary embolus  Allergies  Allergen Reactions  . Bactrim Anaphylaxis and Hives  . Prednisone Anaphylaxis and Hives    Has been on Dexamethasone without issue.  . Nsaids     Has Lupus, reccommended to avoid NSAIDs  . Other Itching    Shrimp:throat itching "can eat other shellfish"  . Pineapple Itching  . Procardia [Nifedipine] Swelling    Swelling of tongue  . Vicodin [Hydrocodone-Acetaminophen] Hives and Swelling    Can take Percocet without difficulty    Patient Measurements: Height: 5' 8.5" (174 cm) Weight: (!) 339 lb (153.8 kg) IBW/kg (Calculated) : 65.05 Heparin Dosing Weight:   Vital Signs: Temp: 98.3 F (36.8 C) (06/04 2351) Temp Source: Oral (06/04 2351) BP: 134/86 (06/04 2351) Pulse Rate: 83 (06/04 2351)  Labs:  Recent Labs  12/25/16 1830 12/25/16 2140  HGB 12.4  --   HCT 37.7  --   PLT 399  --   CREATININE 0.76  --   TROPONINI <0.03 <0.03    Estimated Creatinine Clearance: 166.3 mL/min (by C-G formula based on SCr of 0.76 mg/dL).   Medical History: Past Medical History:  Diagnosis Date  . Anxiety    doing good now  . Asthma   . Headache(784.0)   . HSV infection   . Infection    UTI  . Lupus    dx age 31  . STD (sexually transmitted disease) 02/2009   POSITIVE GC    Medications:  Scheduled:  . enoxaparin (LOVENOX) injection  100 mg Subcutaneous Q12H   And  . enoxaparin (LOVENOX) injection  55 mg Subcutaneous Q12H  . enoxaparin (LOVENOX) injection  150 mg Subcutaneous NOW    Assessment: Patient with PE on CT exam.  MD wants enoxaparin.  Baseline coags ordered.  No oral anticoagulants noted on med rec.   Goal of Therapy:  Anti-Xa level 0.6-1 units/ml 4hrs after LMWH dose given Monitor platelets by anticoagulation protocol: Yes   Plan:  Enoxaparin 150mg  sq x1, then 155mg  sq q12hr   Darlina GuysGrimsley Jr, Jacquenette ShoneJulian Crowford 12/26/2016,3:33 AM

## 2016-12-26 NOTE — Progress Notes (Signed)
**  Preliminary report by tech**  Bilateral lower extremity venous duplex completed. There is no evidence of deep or superficial vein thrombosis involving the right and left lower extremities. All visualized vessels appear patent and compressible. There is no evidence of Baker's cysts bilaterally.  12/26/16 9:31 AM Olen CordialGreg Latrisha Coiro RVT

## 2016-12-29 ENCOUNTER — Emergency Department (HOSPITAL_COMMUNITY): Payer: 59

## 2016-12-29 ENCOUNTER — Encounter (HOSPITAL_COMMUNITY): Payer: Self-pay | Admitting: Emergency Medicine

## 2016-12-29 ENCOUNTER — Inpatient Hospital Stay (HOSPITAL_COMMUNITY)
Admission: EM | Admit: 2016-12-29 | Discharge: 2017-01-02 | DRG: 176 | Disposition: A | Discharge status: Home / Self Care | Payer: 59 | Attending: Internal Medicine | Admitting: Internal Medicine

## 2016-12-29 ENCOUNTER — Emergency Department (HOSPITAL_COMMUNITY)
Admit: 2016-12-29 | Discharge: 2016-12-29 | Disposition: A | Discharge status: Home / Self Care | Payer: 59 | Attending: Emergency Medicine | Admitting: Emergency Medicine

## 2016-12-29 DIAGNOSIS — I2690 Septic pulmonary embolism without acute cor pulmonale: Secondary | ICD-10-CM | POA: Diagnosis not present

## 2016-12-29 DIAGNOSIS — I2699 Other pulmonary embolism without acute cor pulmonale: Secondary | ICD-10-CM | POA: Diagnosis not present

## 2016-12-29 DIAGNOSIS — M79609 Pain in unspecified limb: Secondary | ICD-10-CM | POA: Diagnosis not present

## 2016-12-29 DIAGNOSIS — L930 Discoid lupus erythematosus: Secondary | ICD-10-CM

## 2016-12-29 DIAGNOSIS — Z6841 Body Mass Index (BMI) 40.0 and over, adult: Secondary | ICD-10-CM | POA: Diagnosis not present

## 2016-12-29 DIAGNOSIS — R0602 Shortness of breath: Secondary | ICD-10-CM

## 2016-12-29 DIAGNOSIS — Z86711 Personal history of pulmonary embolism: Secondary | ICD-10-CM | POA: Diagnosis not present

## 2016-12-29 DIAGNOSIS — J45909 Unspecified asthma, uncomplicated: Secondary | ICD-10-CM | POA: Diagnosis not present

## 2016-12-29 DIAGNOSIS — R079 Chest pain, unspecified: Secondary | ICD-10-CM

## 2016-12-29 DIAGNOSIS — Z7901 Long term (current) use of anticoagulants: Secondary | ICD-10-CM | POA: Diagnosis not present

## 2016-12-29 DIAGNOSIS — IMO0002 Reserved for concepts with insufficient information to code with codable children: Secondary | ICD-10-CM | POA: Diagnosis present

## 2016-12-29 DIAGNOSIS — M7989 Other specified soft tissue disorders: Secondary | ICD-10-CM | POA: Diagnosis not present

## 2016-12-29 DIAGNOSIS — E876 Hypokalemia: Secondary | ICD-10-CM | POA: Diagnosis present

## 2016-12-29 DIAGNOSIS — Z825 Family history of asthma and other chronic lower respiratory diseases: Secondary | ICD-10-CM | POA: Diagnosis not present

## 2016-12-29 DIAGNOSIS — M329 Systemic lupus erythematosus, unspecified: Secondary | ICD-10-CM | POA: Diagnosis present

## 2016-12-29 DIAGNOSIS — Z7952 Long term (current) use of systemic steroids: Secondary | ICD-10-CM | POA: Diagnosis not present

## 2016-12-29 LAB — BASIC METABOLIC PANEL
ANION GAP: 8 (ref 5–15)
BUN: 14 mg/dL (ref 6–20)
CHLORIDE: 104 mmol/L (ref 101–111)
CO2: 25 mmol/L (ref 22–32)
Calcium: 9.3 mg/dL (ref 8.9–10.3)
Creatinine, Ser: 0.69 mg/dL (ref 0.44–1.00)
GFR calc Af Amer: >60 mL/min (ref >60)
GFR calc non Af Amer: >60 mL/min (ref >60)
GLUCOSE: 97 mg/dL (ref 65–99)
POTASSIUM: 3.8 mmol/L (ref 3.5–5.1)
Sodium: 137 mmol/L (ref 135–145)

## 2016-12-29 LAB — HEPATIC FUNCTION PANEL
ALBUMIN: 3.7 g/dL (ref 3.5–5.0)
ALK PHOS: 106 U/L (ref 38–126)
ALT: 24 U/L (ref 14–54)
AST: 29 U/L (ref 15–41)
Bilirubin, Direct: <0.1 mg/dL — ABNORMAL LOW (ref 0.1–0.5)
TOTAL PROTEIN: 7.5 g/dL (ref 6.5–8.1)
Total Bilirubin: 0.4 mg/dL (ref 0.3–1.2)

## 2016-12-29 LAB — CBC
HEMATOCRIT: 38 % (ref 36.0–46.0)
HEMOGLOBIN: 12.2 g/dL (ref 12.0–15.0)
MCH: 26.6 pg (ref 26.0–34.0)
MCHC: 32.1 g/dL (ref 30.0–36.0)
MCV: 82.8 fL (ref 78.0–100.0)
Platelets: 405 10*3/uL — ABNORMAL HIGH (ref 150–400)
RBC: 4.59 MIL/uL (ref 3.87–5.11)
RDW: 14.3 % (ref 11.5–15.5)
WBC: 5.7 10*3/uL (ref 4.0–10.5)

## 2016-12-29 LAB — APTT: aPTT: 49 seconds — ABNORMAL HIGH (ref 24–36)

## 2016-12-29 LAB — HEPARIN LEVEL (UNFRACTIONATED)

## 2016-12-29 LAB — I-STAT TROPONIN, ED: Troponin i, poc: 0 ng/mL (ref 0.00–0.08)

## 2016-12-29 LAB — PROTIME-INR
INR: 1.57
Prothrombin Time: 18.9 seconds — ABNORMAL HIGH (ref 11.4–15.2)

## 2016-12-29 MED ORDER — ADULT MULTIVITAMIN W/MINERALS CH
1.0000 | ORAL_TABLET | Freq: Every day | ORAL | Status: DC
  Administered 2016-12-30 – 2017-01-02 (×4): 1 via ORAL
  Filled 2016-12-29 (×4): qty 1

## 2016-12-29 MED ORDER — ACETAMINOPHEN 500 MG PO TABS
1000.0000 mg | ORAL_TABLET | Freq: Four times a day (QID) | ORAL | Status: DC | PRN
  Filled 2016-12-29: qty 2

## 2016-12-29 MED ORDER — SODIUM CHLORIDE 0.9% FLUSH
3.0000 mL | Freq: Two times a day (BID) | INTRAVENOUS | Status: DC
  Administered 2016-12-29: 3 mL via INTRAVENOUS

## 2016-12-29 MED ORDER — ONDANSETRON HCL 4 MG PO TABS: 4.0000 mg | ORAL_TABLET | Freq: Four times a day (QID) | ORAL | Status: DC | PRN

## 2016-12-29 MED ORDER — ALBUTEROL SULFATE (2.5 MG/3ML) 0.083% IN NEBU: 3.0000 mL | INHALATION_SOLUTION | Freq: Four times a day (QID) | RESPIRATORY_TRACT | Status: DC | PRN

## 2016-12-29 MED ORDER — COUMADIN BOOK
Freq: Once | Status: AC
  Administered 2016-12-30: 10:00:00
  Filled 2016-12-29: qty 1

## 2016-12-29 MED ORDER — MORPHINE SULFATE (PF) 2 MG/ML IV SOLN
2.0000 mg | INTRAVENOUS | Status: DC | PRN
  Administered 2016-12-29 – 2017-01-02 (×15): 2 mg via INTRAVENOUS
  Filled 2016-12-29 (×15): qty 1

## 2016-12-29 MED ORDER — METHYLPREDNISOLONE 2 MG PO TABS
4.0000 mg | ORAL_TABLET | Freq: Every day | ORAL | Status: DC
  Administered 2016-12-30 – 2017-01-02 (×4): 4 mg via ORAL
  Filled 2016-12-29 (×4): qty 2

## 2016-12-29 MED ORDER — ONDANSETRON HCL 4 MG/2ML IJ SOLN
4.0000 mg | Freq: Once | INTRAMUSCULAR | Status: AC
  Administered 2016-12-29: 4 mg via INTRAVENOUS
  Filled 2016-12-29: qty 2

## 2016-12-29 MED ORDER — SODIUM CHLORIDE 0.9 % IV SOLN: 250.0000 mL | INTRAVENOUS | Status: DC | PRN

## 2016-12-29 MED ORDER — IOPAMIDOL (ISOVUE-370) INJECTION 76%
INTRAVENOUS | Status: AC
  Administered 2016-12-29: 16:00:00
  Filled 2016-12-29: qty 100

## 2016-12-29 MED ORDER — IOPAMIDOL (ISOVUE-370) INJECTION 76%
100.0000 mL | Freq: Once | INTRAVENOUS | Status: AC | PRN
  Administered 2016-12-29: 80 mL via INTRAVENOUS
  Administered 2016-12-29: 17:00:00 via INTRAVENOUS

## 2016-12-29 MED ORDER — MORPHINE SULFATE (PF) 2 MG/ML IV SOLN
4.0000 mg | Freq: Once | INTRAVENOUS | Status: AC
  Administered 2016-12-29: 4 mg via INTRAVENOUS
  Filled 2016-12-29: qty 2

## 2016-12-29 MED ORDER — ONDANSETRON HCL 4 MG/2ML IJ SOLN
4.0000 mg | Freq: Four times a day (QID) | INTRAMUSCULAR | Status: DC | PRN
  Filled 2016-12-29: qty 2

## 2016-12-29 MED ORDER — SENNA 8.6 MG PO TABS
1.0000 | ORAL_TABLET | Freq: Two times a day (BID) | ORAL | Status: DC
  Administered 2016-12-29 – 2017-01-02 (×8): 8.6 mg via ORAL
  Filled 2016-12-29 (×8): qty 1

## 2016-12-29 MED ORDER — IOPAMIDOL (ISOVUE-370) INJECTION 76%
INTRAVENOUS | Status: AC
  Filled 2016-12-29: qty 100

## 2016-12-29 MED ORDER — WARFARIN VIDEO: Freq: Once | Status: DC

## 2016-12-29 MED ORDER — WARFARIN SODIUM 5 MG PO TABS
10.0000 mg | ORAL_TABLET | Freq: Once | ORAL | Status: AC
  Administered 2016-12-29: 10 mg via ORAL
  Filled 2016-12-29: qty 2

## 2016-12-29 MED ORDER — IOPAMIDOL (ISOVUE-370) INJECTION 76%
100.0000 mL | Freq: Once | INTRAVENOUS | Status: AC | PRN
  Administered 2016-12-29: 100 mL via INTRAVENOUS

## 2016-12-29 MED ORDER — HYDROXYCHLOROQUINE SULFATE 200 MG PO TABS
200.0000 mg | ORAL_TABLET | Freq: Every day | ORAL | Status: DC
  Administered 2016-12-30 – 2017-01-02 (×4): 200 mg via ORAL
  Filled 2016-12-29 (×4): qty 1

## 2016-12-29 MED ORDER — SODIUM CHLORIDE 0.9% FLUSH
3.0000 mL | Freq: Two times a day (BID) | INTRAVENOUS | Status: DC
  Administered 2016-12-29 – 2016-12-30 (×2): 3 mL via INTRAVENOUS

## 2016-12-29 MED ORDER — HEPARIN (PORCINE) IN NACL 100-0.45 UNIT/ML-% IJ SOLN
1900.0000 [IU]/h | INTRAMUSCULAR | Status: DC
  Administered 2016-12-29: 2000 [IU]/h via INTRAVENOUS
  Filled 2016-12-29 (×2): qty 250

## 2016-12-29 MED ORDER — SODIUM CHLORIDE 0.9% FLUSH: 3.0000 mL | INTRAVENOUS | Status: DC | PRN

## 2016-12-29 MED ORDER — DOCUSATE SODIUM 100 MG PO CAPS
100.0000 mg | ORAL_CAPSULE | Freq: Two times a day (BID) | ORAL | Status: DC
Start: 2016-12-29 — End: 2017-01-02
  Administered 2016-12-29 – 2017-01-02 (×8): 100 mg via ORAL
  Filled 2016-12-29 (×8): qty 1

## 2016-12-29 MED ORDER — WARFARIN - PHARMACIST DOSING INPATIENT
Freq: Every day | Status: DC
  Administered 2016-12-31: 18:00:00

## 2016-12-29 NOTE — Progress Notes (Addendum)
ANTICOAGULATION CONSULT NOTE  Pharmacy Consult for IV heparin + warfarin Indication: pulmonary embolus  Allergies  Allergen Reactions  . Bactrim Anaphylaxis and Hives  . Prednisone Anaphylaxis and Hives    Has been on Dexamethasone without issue.  . Nsaids     Has Lupus, reccommended to avoid NSAIDs  . Other Itching    Shrimp:throat itching "can eat other shellfish"  . Pineapple Itching  . Procardia [Nifedipine] Swelling    Swelling of tongue  . Vicodin [Hydrocodone-Acetaminophen] Hives and Swelling    Can take Percocet without difficulty    Patient Measurements: Height: 5\' 9"  (175.3 cm) Weight: (!) 337 lb (152.9 kg) IBW/kg (Calculated) : 66.2 Heparin Dosing Weight: 104 kg  Vital Signs: Temp: 97.7 F (36.5 C) (06/08 1831) Temp Source: Oral (06/08 1831) BP: 120/75 (06/08 1831) Pulse Rate: 72 (06/08 1831)  Labs:  Recent Labs  12/29/16 1335 12/29/16 1356 12/29/16 1430  HGB 12.2  --   --   HCT 38.0  --   --   PLT 405*  --   --   APTT  --  49*  --   LABPROT  --  18.9*  --   INR  --  1.57  --   HEPARINUNFRC  --   --  >2.20*  CREATININE 0.69  --   --     Estimated Creatinine Clearance: 166.8 mL/min (by C-G formula based on SCr of 0.69 mg/dL).   Medical History: Past Medical History:  Diagnosis Date  . Anxiety    doing good now  . Asthma   . Headache(784.0)   . HSV infection   . Infection    UTI  . Lupus    dx age 31  . STD (sexually transmitted disease) 02/2009   POSITIVE GC    Medications:  Prescriptions Prior to Admission  Medication Sig Dispense Refill Last Dose  . acetaminophen (TYLENOL) 500 MG tablet Take 1,000 mg by mouth every 6 (six) hours as needed.   12/28/2016 at Unknown time  . albuterol (PROVENTIL HFA;VENTOLIN HFA) 108 (90 Base) MCG/ACT inhaler Inhale 2 puffs into the lungs every 6 (six) hours as needed for wheezing or shortness of breath.   12/28/2016 at Unknown time  . albuterol (PROVENTIL) (2.5 MG/3ML) 0.083% nebulizer solution Take 2.5  mg by nebulization every 6 (six) hours as needed for wheezing or shortness of breath.   12/28/2016 at Unknown time  . Hydroxychloroquine Sulfate (PLAQUENIL PO) Take 200 mg by mouth daily.    12/29/2016 at 0930  . methylPREDNISolone (MEDROL) 2 MG tablet Take 4 mg by mouth daily.    12/29/2016 at Unknown time  . Multiple Vitamins-Minerals (MULTIVITAMIN ADULT PO) Take 1 tablet by mouth daily.   12/29/2016 at Unknown time  . Rivaroxaban 15 & 20 MG TBPK Take as directed on package: Start with one 15mg  tablet by mouth twice a day with food. On Day 22, switch to one 20mg  tablet once a day with food. (Patient taking differently: Take 15 mg by mouth 2 (two) times daily. Take as directed on package: Start with one 15mg  tablet by mouth twice a day with food. On Day 22, switch to one 20mg  tablet once a day with food.) 51 each 0 12/29/2016 at 0930   Scheduled:  . docusate sodium  100 mg Oral BID  . [START ON 12/30/2016] hydroxychloroquine  200 mg Oral Daily  . [START ON 12/30/2016] methylPREDNISolone  4 mg Oral Daily  . [START ON 12/30/2016] multivitamin with minerals  1  tablet Oral Daily  . senna  1 tablet Oral BID  . sodium chloride flush  3 mL Intravenous Q12H  . sodium chloride flush  3 mL Intravenous Q12H   PRN: sodium chloride, acetaminophen, albuterol, morphine injection, ondansetron **OR** ondansetron (ZOFRAN) IV, sodium chloride flush  Assessment: 31 yoF with PMH Lupus, asthma, and recent PE 12/25/16 discharged on Xarelto presents 6/8 with new onset CP and RLE pain with SOB/nausea. Pain from original PE had abated prior to this morning's events. Pharmacy consulted to dose warfarin with heparin bridge.   Baseline INR, aPTT: slightly elevated as expected on Xarelto  Prior anticoagulation: Xarelto 15 mg bid, last dose today 6/8 at 930  AM  CTa on 6/8 confirms old clot in RLL and reveals new clot in RUL  Significant events:  Today, 12/29/2016:  CBC: wnl  Baseline heparin level elevated as expected on  Xarelto  No bleeding or infusion issues per nursing  Interacting medications: none major  CrCl: > 90 ml/min  Diet ordered  Goal of Therapy: Heparin level 0.3-0.7 units/ml INR 2-3 Monitor platelets by anticoagulation protocol: Yes  Plan:  Heparin 2000 units IV bolus x 1  Heparin 2000 units/hr IV infusion  Check aPTT 6 hrs after start  Warfarin 10 mg x 1 today  Daily CBC and INR  Daily heparin level while artificially elevated by Xarelto. Once aPTT and heparin level correlate, may switch to heparin level only  Monitor for signs of bleeding or thrombosis  Recommended duration of heparin-warfarin overlap is 5 days AND until at least 2 consecutive therapeutic INRs achieved   Bernadene Person, PharmD Pager: [phone] 12/29/2016, 7:50 PM

## 2016-12-29 NOTE — ED Provider Notes (Signed)
[facility]-EMERGENCY DEPT Provider Note   CSN: [id number] Arrival date & time: 12/29/16  1258     History   Chief Complaint Chief Complaint  Patient presents with  . Chest Pain    HPI Hayley Webb is a 31 y.o. female.  HPI  Hayley Webb is a 31 y.o. female, with a history of PE, Lupus, and asthma, presenting to the ED with chest pain beginning around 0930 this morning. Woke up with severe right leg pain around 9 AM. Then began to have crushing left sided chest pain radiating to her left scapula, rated 9/10, constant. Accompanied by shortness of breath, nausea, and palpitations. She states her previous CP and SOB from her recent PE was significantly improved as of last night. Now endorses bilateral lower leg pain. Her left leg began hurting after arriving in the ED.   Patient states that her lupus flares usually manifest with joint aches.  Patient was diagnosed with a PE with borderline right heart strain on June 4. Patient had tiny PEs in the bilateral lower lobes. She was admitted and treated with Lovenox. Discharged on June 5 with Xarelto. Patient states she has been compliant with her Xarelto.  Denies fever, vomiting/diarrhea, peripheral edema, abdominal pain, or any other complaints.   Past Medical History:  Diagnosis Date  . Anxiety    doing good now  . Asthma   . Headache(784.0)   . HSV infection   . Infection    UTI  . Lupus    dx age 48  . 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 Pulmonary embolism (HCC) 12/26/2016  . SOB (shortness of breath)   . Vaginal delivery--VE assist 10/25/2013  . 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 Obesity-BMI 47 05/09/2013  . Hirsutism 10/02/2012  . STD (female) 01/18/2012  . Lupus   . Asthma   . HSV infection     Past Surgical History:  Procedure Laterality Date  . MOUTH SURGERY     2 teeth removed- 1 wisdom and 1 in front of it  . TONSILLECTOMY AND ADENOIDECTOMY  1998  . WISDOM TOOTH EXTRACTION      OB History    Gravida Para Term Preterm AB Living   2 1 1   1 1    SAB TAB Ectopic Multiple Live Births   1       1       Home Medications    Prior to Admission medications   Medication Sig Start Date End Date Taking? Authorizing Provider  acetaminophen (TYLENOL) 500 MG tablet Take 1,000 mg by mouth every 6 (six) hours as needed.   Yes [provider]  albuterol (PROVENTIL HFA;VENTOLIN HFA) 108 (90 Base) MCG/ACT inhaler Inhale 2 puffs into the lungs every 6 (six) hours as needed for wheezing or shortness of breath.   Yes [provider]  albuterol (PROVENTIL) (2.5 MG/3ML) 0.083% nebulizer solution Take 2.5 mg by nebulization every 6 (six) hours as needed for wheezing or shortness of breath.   Yes [provider]  Hydroxychloroquine Sulfate (PLAQUENIL PO) Take 200 mg by mouth daily.    Yes [provider]  methylPREDNISolone (MEDROL) 2 MG tablet Take 4 mg by mouth daily.    Yes [provider]  Multiple Vitamins-Minerals (MULTIVITAMIN ADULT PO) Take 1 tablet by mouth daily.   Yes [provider]  Rivaroxaban 15 & 20 MG TBPK Take as directed on package: Start with one 15mg  tablet by mouth twice a day with food. On Day 22, switch to one 20mg  tablet once a day with food. Patient taking differently: Take 15 mg by mouth 2 (two) times daily. Take as directed on package: Start with one 15mg  tablet by mouth twice a day with food. On Day 22, switch to one 20mg  tablet once a day with food. 12/26/16  Yes Vassie LollMadera, Carlos, MD    Family History Family History  Problem Relation Age of Onset  . Diabetes Maternal Aunt   . Cancer Maternal Grandmother 72       COLON CA  . Diabetes Maternal Grandmother   . Hypertension Maternal Grandfather   . Cancer Maternal Grandfather        prostate  . Asthma Mother   . Asthma Brother   .  Cancer Paternal Grandmother        breast  . Anesthesia problems Neg Hx   . Hypotension Neg Hx   . Malignant hyperthermia Neg Hx   . Pseudochol deficiency Neg Hx     Social History Social History  Substance Use Topics  . Smoking status: Never Smoker  . Smokeless tobacco: Never Used  . Alcohol use No     Allergies   Bactrim; Prednisone; Nsaids; Other; Pineapple; Procardia [nifedipine]; and Vicodin [hydrocodone-acetaminophen]  Patient states she has taken morphine in the past without difficulty.  Review of Systems Review of Systems  Constitutional: Negative for diaphoresis and fever.  Respiratory: Positive for shortness of breath. Negative for cough.   Cardiovascular: Positive for chest pain.  Gastrointestinal: Positive for nausea. Negative for abdominal pain, diarrhea and vomiting.  Musculoskeletal: Positive for myalgias.  Skin: Negative for color change.  Neurological: Negative for headaches.  All other systems reviewed and are negative.    Physical Exam Updated Vital Signs BP (!) 138/96 (BP Location: Right Arm)   Pulse 89   Temp 98.6 F (37 C) (Oral)   Resp 16   Ht 5\' 9"  (1.753 m)   Wt (!) 152.9 kg (337 lb)   LMP 12/11/2016   SpO2 97%   BMI 49.77 kg/m   Physical Exam  Constitutional: She appears well-developed and well-nourished. No distress.  HENT:  Head: Normocephalic and atraumatic.  Eyes: Conjunctivae are normal.  Neck: Neck supple.  Cardiovascular: Normal rate, regular rhythm, normal heart sounds and intact distal pulses.   Pulmonary/Chest: Breath sounds normal.  No increased work of breathing at rest. Patient becomes tachypneic and has increased work of breathing with minimal exertion.  Abdominal: Soft. There is no tenderness. There is no guarding.  Musculoskeletal: She exhibits tenderness. She exhibits no edema.  Tenderness to bilateral calves without noted swelling, erythema, or increased warmth.  Lymphadenopathy:    She has no cervical  adenopathy.  Neurological: She is alert.  Skin: Skin is warm and dry. She is not diaphoretic.  Psychiatric: She has a normal mood and affect. Her behavior is normal.  Nursing note and vitals reviewed.    ED Treatments / Results  Labs (all labs ordered are listed, but only abnormal results are displayed) Labs Reviewed  CBC - Abnormal; Notable for the following:       Result Value   Platelets 405 (*)    All other components within normal limits  APTT - Abnormal; Notable for the following:    aPTT 49 (*)    All other components within normal limits  PROTIME-INR -  Abnormal; Notable for the following:    Prothrombin Time 18.9 (*)    All other components within normal limits  HEPATIC FUNCTION PANEL - Abnormal; Notable for the following:    Bilirubin, Direct <0.1 (*)    All other components within normal limits  BASIC METABOLIC PANEL  HEPARIN LEVEL (UNFRACTIONATED)  CBC  HEPARIN LEVEL (UNFRACTIONATED)  APTT  I-STAT TROPOININ, ED    EKG  EKG Interpretation  Date/Time:  Friday December 29 2016 13:18:00 EDT Ventricular Rate:  95 PR Interval:    QRS Duration: 92 QT Interval:  363 QTC Calculation: 457 R Axis:   24 Text Interpretation:  Sinus rhythm No significant change since last tracing Confirmed by Frederick Peers [phone]) on 12/29/2016 1:23:18 PM Also confirmed by Frederick Peers [phone]), editor Rollen Sox [phone])  on 12/29/2016 2:12:14 PM       Radiology Dg Chest 2 View  Result Date: 12/29/2016 CLINICAL DATA:  Pulmonary emboli with shortness of Breath EXAM: CHEST  2 VIEW COMPARISON:  12/26/2016 FINDINGS: Cardiac shadow is within normal limits. The lungs are clear bilaterally. No bony abnormality is seen. IMPRESSION: No active cardiopulmonary disease. Electronically Signed   By: Alcide Clever M.D.   On: 12/29/2016 14:44   Ct Angio Chest Pe W Or Wo Contrast  Result Date: 12/29/2016 CLINICAL DATA:  Diagnosed with pulmonary emboli on a CTA chest on 12/26/2016, discharged on Tuesday,  returned today with increased pain 10 times worsened LEFT chest beginning this morning at 0900 hours, shortness of breath, dizziness, history asthma, lupus EXAM: CT ANGIOGRAPHY CHEST WITH CONTRAST TECHNIQUE: Multidetector CT imaging of the chest was performed using the standard protocol during bolus administration of intravenous contrast. Multiplanar CT image reconstructions and MIPs were obtained to evaluate the vascular anatomy. Initial images following contrast showed subtle full opacification of the pulmonary arterial tree. Patient was reinjected with additional contrast for repeat CTA imaging. CONTRAST:  180 cc Isovue 370 IV total between 2 injections (100 cc, 80 cc) COMPARISON:  12/26/2016 FINDINGS: Cardiovascular: Aorta normal caliber without aneurysm or dissection. Cardiac chambers unremarkable. No pericardial effusion. Pulmonary arterial opacification is adequate centrally on reinjection images. Degradation of image quality secondary to body habitus. New filling defects identified in RIGHT upper lobe pulmonary artery is series 10 images 79 and 88. Tiny LEFT upper lobe pulmonary arterial defect series 10, image 81, questionably present on previous exam. RV/LV ratio of 0.87. Mediastinum/Nodes: Esophagus unremarkable. Base of cervical region normal appearance. No definite thoracic adenopathy, with a few normal sized lymph superior mediastinal nodes identified. Lungs/Pleura: Scattered atelectasis in the lower lobes. Remaining lungs clear. No pleural effusion or pneumothorax. Upper Abdomen: Upper abdomen grossly unremarkable. Musculoskeletal: Unremarkable Review of the MIP images confirms the above findings. IMPRESSION: New pulmonary embolus in a RIGHT upper lobe pulmonary arterial branch. Tiny persistent embolus in a RIGHT lower lobe pulmonary artery. Tiny probable pulmonary embolus in LEFT upper lobe, potentially present on previous exam. Findings called to Harolyn Rutherford PA on 12/29/2016 at 1645 hours.  Electronically Signed   By: Ulyses Southward M.D.   On: 12/29/2016 16:45    Procedures Procedures (including critical care time)  Medications Ordered in ED Medications  heparin ADULT infusion 100 units/mL (25000 units/211mL sodium chloride 0.45%) (not administered)  iopamidol (ISOVUE-370) 76 % injection 100 mL (100 mLs Intravenous Contrast Given 12/29/16 1532)  ondansetron (ZOFRAN) injection 4 mg (4 mg Intravenous Given 12/29/16 1554)  morphine 2 MG/ML injection 4 mg (4 mg Intravenous Given 12/29/16 1554)  iopamidol (ISOVUE-370) 76 %  injection (  Contrast Given 12/29/16 1555)  iopamidol (ISOVUE-370) 76 % injection 100 mL ( Intravenous Contrast Given 12/29/16 1651)     Initial Impression / Assessment and Plan / ED Course  I have reviewed the triage vital signs and the nursing notes.  Pertinent labs & imaging results that were available during my care of the patient were reviewed by me and considered in my medical decision making (see chart for details).  Clinical Course as of Dec 30 1727  Fri Dec 29, 2016  1650 CT results discussed with patient. Rates her pain at 7/10.   [SJ]  1702 Spoke with Dr. Sunnie Nielsen, hospitalist, who agreed to admit the patient.  [SJ]    Clinical Course User Index [SJ] Gaynor Ferreras C, PA-C    Patient presents with some onset of chest pain and shortness of breath, worse than previous. Patient is nontoxic appearing and has no noted distress at rest. Imaging obtained to Assess possibility of worsening clot burden. No DVTs on duplex ultrasound. Patient has new PE in the right upper lobe. Heparin initiated and patient admitted.   Findings and plan of care discussed with Frederick Peers, MD. Dr. Clarene Duke personally evaluated and examined this patient.   Vitals:   12/29/16 1304 12/29/16 1310 12/29/16 1641  BP: (!) 138/96  136/81  Pulse: 89  72  Resp: 16  19  Temp: 98.6 F (37 C)  98.2 F (36.8 C)  TempSrc: Oral  Oral  SpO2: 97%  100%  Weight:  (!) 152.9 kg (337 lb)   Height:   5\' 9"  (1.753 m)      Final Clinical Impressions(s) / ED Diagnoses   Final diagnoses:  Shortness of breath  Pulmonary embolism (HCC)  Chest pain    New Prescriptions New Prescriptions   No medications on file     Concepcion Living 12/29/16 1729    Little, Ambrose Finland, MD 12/29/16 1818

## 2016-12-29 NOTE — Progress Notes (Signed)
VASCULAR LAB PRELIMINARY  PRELIMINARY  PRELIMINARY  PRELIMINARY  Bilateral lower extremity venous duplex completed.    Preliminary report:  Bilateral:  No evidence of DVT, superficial thrombosis, or Baker's Cyst. No change from study of 12/26/2016.   Lenny Fiumara, RVS 12/29/2016, 3:26 PM

## 2016-12-29 NOTE — ED Notes (Signed)
I attempted twice to collect labs and was unsuccessful 

## 2016-12-29 NOTE — ED Triage Notes (Addendum)
Pt was discharged from here on Tuesday after being diagnosed with a PE.  Returned today with increased pain (pt states 10 x worse) on the left chest that began this morning around 0900.  Endorses SOB and dizziness. Denies N&V. Pt A&O x4. Ambulatory.  Endorses taking Xarelto as prescribed.

## 2016-12-29 NOTE — ED Notes (Signed)
US at bedside

## 2016-12-29 NOTE — H&P (Signed)
History and Physical    Hayley Webb RUE:[id number] DOB: [birth year] DOA: 12/29/2016  PCP: Patient, No Pcp Per  Patient coming from: Home   I have personally briefly reviewed patient's old medical records in [facility]  Chief Complaint: chest pain   HPI: Hayley Webb is a 31 y.o. female with medical history significant of Lupus, asthma, recent diagnosed of PE 6-05, discharge on Xarelto. Patient report complaint with xarelto. She develops right LE pain that wake her up. She report that 15 mints after she started to have worsening chest pain right side, crushing pain, 9/10, associated with SOB.    ED Course: presents with chest pain, vitals stable, CT angio showed new PE right upper lobe pulmonary artery. INR 1.5,   Review of Systems: As per HPI otherwise 10 point review of systems negative.    Past Medical History:  Diagnosis Date  . Anxiety    doing good now  . Asthma   . Headache(784.0)   . HSV infection   . Infection    UTI  . Lupus    dx age 89  . 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 MOUTH SURGERY     2 teeth removed- 1 wisdom and 1 in front of it  . TONSILLECTOMY AND ADENOIDECTOMY  1998  . WISDOM TOOTH EXTRACTION       reports that she has never smoked. She has never used smokeless tobacco. She reports that she does not drink alcohol or use drugs.  Allergies  Allergen Reactions  . Bactrim Anaphylaxis and Hives  . Prednisone Anaphylaxis and Hives    Has been on Dexamethasone without issue.  . Nsaids     Has Lupus, reccommended to avoid NSAIDs  . Other Itching    Shrimp:throat itching "can eat other shellfish"  . Pineapple Itching  . Procardia [Nifedipine] Swelling    Swelling of tongue  . Vicodin [Hydrocodone-Acetaminophen] Hives and Swelling    Can take Percocet without difficulty    Family History  Problem Relation Age of Onset  . Diabetes Maternal Aunt   . Cancer  Maternal Grandmother 72       COLON CA  . Diabetes Maternal Grandmother   . Hypertension Maternal Grandfather   . Cancer Maternal Grandfather        prostate  . Asthma Mother   . Asthma Brother   . Cancer Paternal Grandmother        breast  . Anesthesia problems Neg Hx   . Hypotension Neg Hx   . Malignant hyperthermia Neg Hx   . Pseudochol deficiency Neg Hx      Prior to Admission medications   Medication Sig Start Date End Date Taking? Authorizing Provider  acetaminophen (TYLENOL) 500 MG tablet Take 1,000 mg by mouth every 6 (six) hours as needed.   Yes [provider]  albuterol (PROVENTIL HFA;VENTOLIN HFA) 108 (90 Base) MCG/ACT inhaler Inhale 2 puffs into the lungs every 6 (six) hours as needed for wheezing or shortness of breath.   Yes [provider]  albuterol (PROVENTIL) (2.5 MG/3ML) 0.083% nebulizer solution Take 2.5 mg by nebulization every 6 (six) hours as needed for wheezing or shortness of breath.   Yes [provider]  Hydroxychloroquine Sulfate (PLAQUENIL PO) Take 200 mg by mouth daily.    Yes [provider]  methylPREDNISolone (MEDROL) 2 MG tablet Take 4 mg by mouth daily.    Yes  [provider]  Multiple Vitamins-Minerals (MULTIVITAMIN ADULT PO) Take 1 tablet by mouth daily.   Yes [provider]  Rivaroxaban 15 & 20 MG TBPK Take as directed on package: Start with one 15mg  tablet by mouth twice a day with food. On Day 22, switch to one 20mg  tablet once a day with food. Patient taking differently: Take 15 mg by mouth 2 (two) times daily. Take as directed on package: Start with one 15mg  tablet by mouth twice a day with food. On Day 22, switch to one 20mg  tablet once a day with food. 12/26/16  Yes Vassie Loll, MD    Physical Exam: Vitals:   12/29/16 1304 12/29/16 1310 12/29/16 1641  BP: (!) 138/96  136/81  Pulse: 89  72  Resp: 16  19  Temp: 98.6 F (37 C)  98.2 F (36.8 C)  TempSrc: Oral  Oral  SpO2: 97%  100%   Weight:  (!) 152.9 kg (337 lb)   Height:  5\' 9"  (1.753 m)     Constitutional: NAD, calm, comfortable Vitals:   12/29/16 1304 12/29/16 1310 12/29/16 1641  BP: (!) 138/96  136/81  Pulse: 89  72  Resp: 16  19  Temp: 98.6 F (37 C)  98.2 F (36.8 C)  TempSrc: Oral  Oral  SpO2: 97%  100%  Weight:  (!) 152.9 kg (337 lb)   Height:  5\' 9"  (1.753 m)    Eyes: PERRL, lids and conjunctivae normal ENMT: Mucous membranes are moist. Posterior pharynx clear of any exudate or lesions.Normal dentition.  Neck: normal, supple, no masses, no thyromegaly Respiratory: clear to auscultation bilaterally, no wheezing, no crackles. Normal respiratory effort. No accessory muscle use.  Cardiovascular: Regular rate and rhythm, no murmurs / rubs / gallops. No extremity edema. 2+ pedal pulses. No carotid bruits.  Abdomen: no tenderness, no masses palpated. No hepatosplenomegaly. Bowel sounds positive.  Musculoskeletal: no clubbing / cyanosis. No joint deformity upper and lower extremities. Good ROM, no contractures. Normal muscle tone.  Skin: no rashes, lesions, ulcers. No induration Neurologic: CN 2-12 grossly intact. Sensation intact, DTR normal. Strength 5/5 in all 4.  Psychiatric: Normal judgment and insight. Alert and oriented x 3. Normal mood.    Labs on Admission: I have personally reviewed following labs and imaging studies  CBC:  Recent Labs Lab 12/25/16 1830 12/26/16 0749 12/29/16 1335  WBC 5.8 7.1 5.7  NEUTROABS  --  2.4  --   HGB 12.4 11.6* 12.2  HCT 37.7 35.6* 38.0  MCV 82.9 83.0 82.8  PLT 399 368 405*   Basic Metabolic Panel:  Recent Labs Lab 12/25/16 1830 12/26/16 0749 12/29/16 1335  NA 138 138 137  K 3.7 4.0 3.8  CL 104 104 104  CO2 24 26 25   GLUCOSE 94 99 97  BUN 9 10 14   CREATININE 0.76 0.72 0.69  CALCIUM 9.2 9.5 9.3   GFR: Estimated Creatinine Clearance: 166.8 mL/min (by C-G formula based on SCr of 0.69 mg/dL). Liver Function Tests:  Recent Labs Lab  12/29/16 1356  AST 29  ALT 24  ALKPHOS 106  BILITOT 0.4  PROT 7.5  ALBUMIN 3.7   No results for input(s): LIPASE, AMYLASE in the last 168 hours. No results for input(s): AMMONIA in the last 168 hours. Coagulation Profile:  Recent Labs Lab 12/26/16 0401 12/29/16 1356  INR 1.03 1.57   Cardiac Enzymes:  Recent Labs Lab 12/25/16 1830 12/25/16 2140 12/26/16 0749  TROPONINI <0.03 <0.03 <0.03   BNP (last  3 results) No results for input(s): PROBNP in the last 8760 hours. HbA1C: No results for input(s): HGBA1C in the last 72 hours. CBG: No results for input(s): GLUCAP in the last 168 hours. Lipid Profile: No results for input(s): CHOL, HDL, LDLCALC, TRIG, CHOLHDL, LDLDIRECT in the last 72 hours. Thyroid Function Tests: No results for input(s): TSH, T4TOTAL, FREET4, T3FREE, THYROIDAB in the last 72 hours. Anemia Panel: No results for input(s): VITAMINB12, FOLATE, FERRITIN, TIBC, IRON, RETICCTPCT in the last 72 hours. Urine analysis:    Component Value Date/Time   COLORURINE YELLOW 05/04/2016 2305   APPEARANCEUR CLEAR 05/04/2016 2305   LABSPEC >1.030 (H) 05/04/2016 2305   PHURINE 6.0 05/04/2016 2305   GLUCOSEU NEGATIVE 05/04/2016 2305   HGBUR MODERATE (A) 05/04/2016 2305   BILIRUBINUR NEGATIVE 05/04/2016 2305   KETONESUR NEGATIVE 05/04/2016 2305   PROTEINUR NEGATIVE 05/04/2016 2305   UROBILINOGEN 1.0 04/28/2015 1930   NITRITE NEGATIVE 05/04/2016 2305   LEUKOCYTESUR NEGATIVE 05/04/2016 2305    Radiological Exams on Admission: Dg Chest 2 View  Result Date: 12/29/2016 CLINICAL DATA:  Pulmonary emboli with shortness of Breath EXAM: CHEST  2 VIEW COMPARISON:  12/26/2016 FINDINGS: Cardiac shadow is within normal limits. The lungs are clear bilaterally. No bony abnormality is seen. IMPRESSION: No active cardiopulmonary disease. Electronically Signed   By: Alcide CleverMark  Lukens M.D.   On: 12/29/2016 14:44   Ct Angio Chest Pe W Or Wo Contrast  Result Date: 12/29/2016 CLINICAL DATA:   Diagnosed with pulmonary emboli on a CTA chest on 12/26/2016, discharged on Tuesday, returned today with increased pain 10 times worsened LEFT chest beginning this morning at 0900 hours, shortness of breath, dizziness, history asthma, lupus EXAM: CT ANGIOGRAPHY CHEST WITH CONTRAST TECHNIQUE: Multidetector CT imaging of the chest was performed using the standard protocol during bolus administration of intravenous contrast. Multiplanar CT image reconstructions and MIPs were obtained to evaluate the vascular anatomy. Initial images following contrast showed subtle full opacification of the pulmonary arterial tree. Patient was reinjected with additional contrast for repeat CTA imaging. CONTRAST:  180 cc Isovue 370 IV total between 2 injections (100 cc, 80 cc) COMPARISON:  12/26/2016 FINDINGS: Cardiovascular: Aorta normal caliber without aneurysm or dissection. Cardiac chambers unremarkable. No pericardial effusion. Pulmonary arterial opacification is adequate centrally on reinjection images. Degradation of image quality secondary to body habitus. New filling defects identified in RIGHT upper lobe pulmonary artery is series 10 images 79 and 88. Tiny LEFT upper lobe pulmonary arterial defect series 10, image 81, questionably present on previous exam. RV/LV ratio of 0.87. Mediastinum/Nodes: Esophagus unremarkable. Base of cervical region normal appearance. No definite thoracic adenopathy, with a few normal sized lymph superior mediastinal nodes identified. Lungs/Pleura: Scattered atelectasis in the lower lobes. Remaining lungs clear. No pleural effusion or pneumothorax. Upper Abdomen: Upper abdomen grossly unremarkable. Musculoskeletal: Unremarkable Review of the MIP images confirms the above findings. IMPRESSION: New pulmonary embolus in a RIGHT upper lobe pulmonary arterial branch. Tiny persistent embolus in a RIGHT lower lobe pulmonary artery. Tiny probable pulmonary embolus in LEFT upper lobe, potentially present on  previous exam. Findings called to Harolyn RutherfordShawn Joy PA on 12/29/2016 at 1645 hours. Electronically Signed   By: Ulyses SouthwardMark  Boles M.D.   On: 12/29/2016 16:45    EKG: Independently reviewed. Sinus rhythm.   Assessment/Plan Active Problems:   Pulmonary embolism (HCC)  1-Acute PE;  Patient with recent diagnosis 3 days ago, was taking xarelto. Presents with new onset chest pain. Repeated CT angio showed new PE.  I have  discussed case with hematologist on called, difficult to say this is xarelto failure, but to be safe patient should be treated with Heparin/Lovenox --coumadin. We should check lupus anticoagulant, if positive patient will need to be on baby aspirin also.  -Doppler negative for DVT -Will start Heparin gtt.  -Coumadin per pharmacy.  -IV morphine for pain controlled.    2-Lupus ; continue with Methylprednisolone, and Plaquenil.  Check lupus anticoagulant.   3-Asthma; stable.  PRN albuterol.   4-Obesity;  Counseling provided to patient.   DVT prophylaxis: heparin. Code Status: full code.  Family Communication: Mother at bedside.  Disposition Plan: home at time of discharge.  Consults called: oncologist, phone consult.  Admission status: inpatient.     Alba Cory MD [facility] Pager [phone]  If 7PM-7AM, please contact night-coverage [email or web address] Password [email or web address]  12/29/2016, 5:03 PM

## 2016-12-30 DIAGNOSIS — R0602 Shortness of breath: Secondary | ICD-10-CM

## 2016-12-30 LAB — BASIC METABOLIC PANEL
ANION GAP: 7 (ref 5–15)
BUN: 12 mg/dL (ref 6–20)
CALCIUM: 9 mg/dL (ref 8.9–10.3)
CO2: 27 mmol/L (ref 22–32)
CREATININE: 0.73 mg/dL (ref 0.44–1.00)
Chloride: 105 mmol/L (ref 101–111)
GFR calc Af Amer: >60 mL/min (ref >60)
GFR calc non Af Amer: >60 mL/min (ref >60)
GLUCOSE: 98 mg/dL (ref 65–99)
Potassium: 3.8 mmol/L (ref 3.5–5.1)
Sodium: 139 mmol/L (ref 135–145)

## 2016-12-30 LAB — PROTIME-INR
INR: 1.09
Prothrombin Time: 14.1 seconds (ref 11.4–15.2)

## 2016-12-30 LAB — CBC
HEMATOCRIT: 35.6 % — AB (ref 36.0–46.0)
HEMOGLOBIN: 11.5 g/dL — AB (ref 12.0–15.0)
MCH: 26.8 pg (ref 26.0–34.0)
MCHC: 32.3 g/dL (ref 30.0–36.0)
MCV: 83 fL (ref 78.0–100.0)
Platelets: 393 10*3/uL (ref 150–400)
RBC: 4.29 MIL/uL (ref 3.87–5.11)
RDW: 14.3 % (ref 11.5–15.5)
WBC: 7.2 10*3/uL (ref 4.0–10.5)

## 2016-12-30 LAB — HEPARIN LEVEL (UNFRACTIONATED)
HEPARIN UNFRACTIONATED: 0.9 [IU]/mL — AB (ref 0.30–0.70)
HEPARIN UNFRACTIONATED: 0.54 [IU]/mL (ref 0.30–0.70)
Heparin Unfractionated: 1.14 IU/mL — ABNORMAL HIGH (ref 0.30–0.70)

## 2016-12-30 LAB — APTT
APTT: 134 s — AB (ref 24–36)
aPTT: 109 seconds — ABNORMAL HIGH (ref 24–36)
aPTT: 98 seconds — ABNORMAL HIGH (ref 24–36)

## 2016-12-30 MED ORDER — WARFARIN SODIUM 5 MG PO TABS
10.0000 mg | ORAL_TABLET | Freq: Once | ORAL | Status: AC
  Administered 2016-12-30: 10 mg via ORAL
  Filled 2016-12-30: qty 2

## 2016-12-30 MED ORDER — OXYCODONE-ACETAMINOPHEN 5-325 MG PO TABS
1.0000 | ORAL_TABLET | Freq: Four times a day (QID) | ORAL | Status: DC | PRN
  Administered 2016-12-30 – 2017-01-01 (×3): 1 via ORAL
  Filled 2016-12-30 (×5): qty 1

## 2016-12-30 MED ORDER — HEPARIN (PORCINE) IN NACL 100-0.45 UNIT/ML-% IJ SOLN
1700.0000 [IU]/h | INTRAMUSCULAR | Status: AC
  Administered 2016-12-31: 1700 [IU]/h via INTRAVENOUS
  Filled 2016-12-30 (×2): qty 250

## 2016-12-30 MED ORDER — WARFARIN SODIUM 5 MG PO TABS
5.0000 mg | ORAL_TABLET | ORAL | Status: AC
  Administered 2016-12-30: 5 mg via ORAL
  Filled 2016-12-30: qty 1

## 2016-12-30 MED ORDER — SODIUM CHLORIDE 0.9 % IV SOLN
INTRAVENOUS | Status: DC
  Administered 2016-12-30: 75 mL/h via INTRAVENOUS
  Administered 2016-12-31: 20:00:00 via INTRAVENOUS

## 2016-12-30 NOTE — Progress Notes (Signed)
Pharmacy contacted re;  Coumadin admin and pt vomiting. Pharmacy put in an order for pt to have 5 mg Coumadin. Admin at [phone]

## 2016-12-30 NOTE — Progress Notes (Signed)
PROGRESS NOTE    Hayley Webb  JWJ:[id number] DOB: 12/31/1985 DOA: 12/29/2016 PCP: Patient, No Pcp Per    Brief Narrative: Hayley Webb is a 31 y.o. female with medical history significant of Lupus, asthma, recent diagnosed of PE 6-05, discharge on Xarelto. Patient report complaint with xarelto. She develops right LE pain that wake her up. She report that 15 mints after she started to have worsening chest pain right side, crushing pain, 9/10, associated with SOB.   ED Course: presents with chest pain, vitals stable, CT angio showed new PE right upper lobe pulmonary artery. INR 1.5.    Assessment & Plan:   Active Problems:   Lupus   Pulmonary embolism (HCC)  1-Acute PE;  Patient with recent diagnosis 3 days ago, was taking xarelto. Presents with new onset chest pain. Repeated CT angio showed new PE.  I have discussed case with hematologist on called, difficult to say this is xarelto failure, but to be safe patient should be treated with Heparin/Lovenox --coumadin. We should check lupus anticoagulant, if positive patient will need to be on baby aspirin also.  -Doppler negative for DVT Continue Heparin gtt. Will keep heparin for 24 hours more.  -Coumadin per pharmacy.  -IV morphine for pain controlled.   2-Lupus ; continue with Methylprednisolone, and Plaquenil.   lupus anticoagulant pending.   3-Asthma; stable.  PRN albuterol.   4-Obesity;  Counseling provided to patient.      DVT prophylaxis: Heparin  Code Status: full code.  Family Communication: patient  Disposition Plan: remain inpatient  Consultants:   none   Procedures:  none   Antimicrobials:  none  Subjective: Still with significant chest pain.  Report SOB.    Objective: Vitals:   12/29/16 1831 12/29/16 2201 12/30/16 0503 12/30/16 1358  BP: 120/75 (!) 103/59 109/63 (!) 107/59  Pulse: 72 75 82 80  Resp: 18 20 16 16   Temp: 97.7 F (36.5 C) 97.9 F (36.6 C) 97.9 F (36.6 C) 97.9 F (36.6  C)  TempSrc: Oral Oral Oral Oral  SpO2: 100% 97% 98% 99%  Weight:      Height:        Intake/Output Summary (Last 24 hours) at 12/30/16 1411 Last data filed at 12/30/16 0600  Gross per 24 hour  Intake              246 ml  Output                0 ml  Net              246 ml   Filed Weights   12/29/16 1310  Weight: (!) 152.9 kg (337 lb)    Examination:  General exam: Appears calm and comfortable  Respiratory system: Clear to auscultation. Respiratory effort normal. Cardiovascular system: S1 & S2 heard, RRR. No JVD, murmurs, rubs, gallops or clicks. No pedal edema. Gastrointestinal system: Abdomen is nondistended, soft and nontender. No organomegaly or masses felt. Normal bowel sounds heard. Central nervous system: Alert and oriented. No focal neurological deficits. Extremities: Symmetric 5 x 5 power. Skin: No rashes, lesions or ulcers Psychiatry: Judgement and insight appear normal. Mood & affect appropriate.     Data Reviewed: I have personally reviewed following labs and imaging studies  CBC:  Recent Labs Lab 12/25/16 1830 12/26/16 0749 12/29/16 1335 12/30/16 0238  WBC 5.8 7.1 5.7 7.2  NEUTROABS  --  2.4  --   --   HGB 12.4 11.6* 12.2 11.5*  HCT 37.7  35.6* 38.0 35.6*  MCV 82.9 83.0 82.8 83.0  PLT 399 368 405* 393   Basic Metabolic Panel:  Recent Labs Lab 12/25/16 1830 12/26/16 0749 12/29/16 1335 12/30/16 0238  NA 138 138 137 139  K 3.7 4.0 3.8 3.8  CL 104 104 104 105  CO2 24 26 25 27   GLUCOSE 94 99 97 98  BUN 9 10 14 12   CREATININE 0.76 0.72 0.69 0.73  CALCIUM 9.2 9.5 9.3 9.0   GFR: Estimated Creatinine Clearance: 166.8 mL/min (by C-G formula based on SCr of 0.73 mg/dL). Liver Function Tests:  Recent Labs Lab 12/29/16 1356  AST 29  ALT 24  ALKPHOS 106  BILITOT 0.4  PROT 7.5  ALBUMIN 3.7   No results for input(s): LIPASE, AMYLASE in the last 168 hours. No results for input(s): AMMONIA in the last 168 hours. Coagulation  Profile:  Recent Labs Lab 12/26/16 0401 12/29/16 1356 12/30/16 0238  INR 1.03 1.57 1.09   Cardiac Enzymes:  Recent Labs Lab 12/25/16 1830 12/25/16 2140 12/26/16 0749  TROPONINI <0.03 <0.03 <0.03   BNP (last 3 results) No results for input(s): PROBNP in the last 8760 hours. HbA1C: No results for input(s): HGBA1C in the last 72 hours. CBG: No results for input(s): GLUCAP in the last 168 hours. Lipid Profile: No results for input(s): CHOL, HDL, LDLCALC, TRIG, CHOLHDL, LDLDIRECT in the last 72 hours. Thyroid Function Tests: No results for input(s): TSH, T4TOTAL, FREET4, T3FREE, THYROIDAB in the last 72 hours. Anemia Panel: No results for input(s): VITAMINB12, FOLATE, FERRITIN, TIBC, IRON, RETICCTPCT in the last 72 hours. Sepsis Labs:  Recent Labs Lab 12/26/16 0749  LATICACIDVEN 0.8    No results found for this or any previous visit (from the past 240 hour(s)).       Radiology Studies: Dg Chest 2 View  Result Date: 12/29/2016 CLINICAL DATA:  Pulmonary emboli with shortness of Breath EXAM: CHEST  2 VIEW COMPARISON:  12/26/2016 FINDINGS: Cardiac shadow is within normal limits. The lungs are clear bilaterally. No bony abnormality is seen. IMPRESSION: No active cardiopulmonary disease. Electronically Signed   By: Alcide Clever M.D.   On: 12/29/2016 14:44   Ct Angio Chest Pe W Or Wo Contrast  Result Date: 12/29/2016 CLINICAL DATA:  Diagnosed with pulmonary emboli on a CTA chest on 12/26/2016, discharged on Tuesday, returned today with increased pain 10 times worsened LEFT chest beginning this morning at 0900 hours, shortness of breath, dizziness, history asthma, lupus EXAM: CT ANGIOGRAPHY CHEST WITH CONTRAST TECHNIQUE: Multidetector CT imaging of the chest was performed using the standard protocol during bolus administration of intravenous contrast. Multiplanar CT image reconstructions and MIPs were obtained to evaluate the vascular anatomy. Initial images following contrast  showed subtle full opacification of the pulmonary arterial tree. Patient was reinjected with additional contrast for repeat CTA imaging. CONTRAST:  180 cc Isovue 370 IV total between 2 injections (100 cc, 80 cc) COMPARISON:  12/26/2016 FINDINGS: Cardiovascular: Aorta normal caliber without aneurysm or dissection. Cardiac chambers unremarkable. No pericardial effusion. Pulmonary arterial opacification is adequate centrally on reinjection images. Degradation of image quality secondary to body habitus. New filling defects identified in RIGHT upper lobe pulmonary artery is series 10 images 79 and 88. Tiny LEFT upper lobe pulmonary arterial defect series 10, image 81, questionably present on previous exam. RV/LV ratio of 0.87. Mediastinum/Nodes: Esophagus unremarkable. Base of cervical region normal appearance. No definite thoracic adenopathy, with a few normal sized lymph superior mediastinal nodes identified. Lungs/Pleura: Scattered atelectasis in the  lower lobes. Remaining lungs clear. No pleural effusion or pneumothorax. Upper Abdomen: Upper abdomen grossly unremarkable. Musculoskeletal: Unremarkable Review of the MIP images confirms the above findings. IMPRESSION: New pulmonary embolus in a RIGHT upper lobe pulmonary arterial branch. Tiny persistent embolus in a RIGHT lower lobe pulmonary artery. Tiny probable pulmonary embolus in LEFT upper lobe, potentially present on previous exam. Findings called to Harolyn RutherfordShawn Joy PA on 12/29/2016 at 1645 hours. Electronically Signed   By: Ulyses SouthwardMark  Boles M.D.   On: 12/29/2016 16:45        Scheduled Meds: . docusate sodium  100 mg Oral BID  . hydroxychloroquine  200 mg Oral Daily  . methylPREDNISolone  4 mg Oral Daily  . multivitamin with minerals  1 tablet Oral Daily  . senna  1 tablet Oral BID  . sodium chloride flush  3 mL Intravenous Q12H  . sodium chloride flush  3 mL Intravenous Q12H  . warfarin  10 mg Oral ONCE-1800  . warfarin   Does not apply Once  . Warfarin -  Pharmacist Dosing Inpatient   Does not apply q1800   Continuous Infusions: . sodium chloride    . sodium chloride    . heparin       LOS: 1 day    Time spent: 35 minutes.     Alba Coryegalado, Romaine Neville A, MD [facility] Pager [phone]  If 7PM-7AM, please contact night-coverage [email or web address] Password [email or web address] 12/30/2016, 2:11 PM

## 2016-12-30 NOTE — Progress Notes (Signed)
10 mg of Coumadin was administered at 9140. At 2200 pt vomited X 2.

## 2016-12-30 NOTE — Progress Notes (Signed)
ANTICOAGULATION CONSULT NOTE - Follow Up Consult  Pharmacy Consult for Heparin Indication: pulmonary embolus  Allergies  Allergen Reactions  . Bactrim Anaphylaxis and Hives  . Prednisone Anaphylaxis and Hives    Has been on Dexamethasone without issue.  . Nsaids     Has Lupus, reccommended to avoid NSAIDs  . Other Itching    Shrimp:throat itching "can eat other shellfish"  . Pineapple Itching  . Procardia [Nifedipine] Swelling    Swelling of tongue  . Vicodin [Hydrocodone-Acetaminophen] Hives and Swelling    Can take Percocet without difficulty    Patient Measurements: Height: 5\' 9"  (175.3 cm) Weight: (!) 337 lb (152.9 kg) IBW/kg (Calculated) : 66.2 Heparin Dosing Weight:   Vital Signs: Temp: 97.9 F (36.6 C) (06/08 2201) Temp Source: Oral (06/08 2201) BP: 103/59 (06/08 2201) Pulse Rate: 75 (06/08 2201)  Labs:  Recent Labs  12/29/16 1335 12/29/16 1356 12/29/16 1430 12/30/16 0238  HGB 12.2  --   --  11.5*  HCT 38.0  --   --  35.6*  PLT 405*  --   --  393  APTT  --  49*  --  109*  LABPROT  --  18.9*  --  14.1  INR  --  1.57  --  1.09  HEPARINUNFRC  --   --  >2.20*  --   CREATININE 0.69  --   --  0.73    Estimated Creatinine Clearance: 166.8 mL/min (by C-G formula based on SCr of 0.73 mg/dL).   Medications:  Infusions:  . sodium chloride    . heparin 2,000 Units/hr (12/29/16 1739)    Assessment: Patient with heparin level (high per lab, being diluted to result) and PTT just above goal.  No heparin issues per RN.  PTT ordered with Heparin level until both correlate due to possible drug-lab interaction between oral anticoagulant (rivaroxaban, edoxaban, or apixaban) and anti-Xa level (aka heparin level)   Goal of Therapy:  Heparin level 0.3-0.7 units/ml aPTT 66-102 seconds Monitor platelets by anticoagulation protocol: Yes   Plan:  Decrease heparin to 1900 units/hr Recheck level at [address]  Jackob Crookston Jr, BroxtonJulian Crowford 12/30/2016,4:46 AM

## 2016-12-30 NOTE — Progress Notes (Signed)
ANTICOAGULATION CONSULT NOTE  Pharmacy Consult for IV heparin + warfarin Indication: pulmonary embolus  Allergies  Allergen Reactions  . Bactrim Anaphylaxis and Hives  . Prednisone Anaphylaxis and Hives    Has been on Dexamethasone without issue.  . Nsaids     Has Lupus, reccommended to avoid NSAIDs  . Other Itching    Shrimp:throat itching "can eat other shellfish"  . Pineapple Itching  . Procardia [Nifedipine] Swelling    Swelling of tongue  . Vicodin [Hydrocodone-Acetaminophen] Hives and Swelling    Can take Percocet without difficulty    Patient Measurements: Height: 5\' 9"  (175.3 cm) Weight: (!) 337 lb (152.9 kg) IBW/kg (Calculated) : 66.2 Heparin Dosing Weight: 104 kg  Vital Signs: Temp: 97.9 F (36.6 C) (06/09 0503) Temp Source: Oral (06/09 0503) BP: 109/63 (06/09 0503) Pulse Rate: 82 (06/09 0503)  Labs:  Recent Labs  12/29/16 1335 12/29/16 1356 12/29/16 1430 12/30/16 0238 12/30/16 1128  HGB 12.2  --   --  11.5*  --   HCT 38.0  --   --  35.6*  --   PLT 405*  --   --  393  --   APTT  --  49*  --  109* 134*  LABPROT  --  18.9*  --  14.1  --   INR  --  1.57  --  1.09  --   HEPARINUNFRC  --   --  >2.20* 1.14* 0.90*  CREATININE 0.69  --   --  0.73  --     Estimated Creatinine Clearance: 166.8 mL/min (by C-G formula based on SCr of 0.73 mg/dL).   Medical History: Past Medical History:  Diagnosis Date  . Anxiety    doing good now  . Asthma   . Headache(784.0)   . HSV infection   . Infection    UTI  . Lupus    dx age 31  . STD (sexually transmitted disease) 02/2009   POSITIVE GC    Medications:  Prescriptions Prior to Admission  Medication Sig Dispense Refill Last Dose  . acetaminophen (TYLENOL) 500 MG tablet Take 1,000 mg by mouth every 6 (six) hours as needed.   12/28/2016 at Unknown time  . albuterol (PROVENTIL HFA;VENTOLIN HFA) 108 (90 Base) MCG/ACT inhaler Inhale 2 puffs into the lungs every 6 (six) hours as needed for wheezing or  shortness of breath.   12/28/2016 at Unknown time  . albuterol (PROVENTIL) (2.5 MG/3ML) 0.083% nebulizer solution Take 2.5 mg by nebulization every 6 (six) hours as needed for wheezing or shortness of breath.   12/28/2016 at Unknown time  . Hydroxychloroquine Sulfate (PLAQUENIL PO) Take 200 mg by mouth daily.    12/29/2016 at 0930  . methylPREDNISolone (MEDROL) 2 MG tablet Take 4 mg by mouth daily.    12/29/2016 at Unknown time  . Multiple Vitamins-Minerals (MULTIVITAMIN ADULT PO) Take 1 tablet by mouth daily.   12/29/2016 at Unknown time  . Rivaroxaban 15 & 20 MG TBPK Take as directed on package: Start with one 15mg  tablet by mouth twice a day with food. On Day 22, switch to one 20mg  tablet once a day with food. (Patient taking differently: Take 15 mg by mouth 2 (two) times daily. Take as directed on package: Start with one 15mg  tablet by mouth twice a day with food. On Day 22, switch to one 20mg  tablet once a day with food.) 51 each 0 12/29/2016 at 0930   Scheduled:  . docusate sodium  100 mg Oral BID  .  hydroxychloroquine  200 mg Oral Daily  . methylPREDNISolone  4 mg Oral Daily  . multivitamin with minerals  1 tablet Oral Daily  . senna  1 tablet Oral BID  . sodium chloride flush  3 mL Intravenous Q12H  . sodium chloride flush  3 mL Intravenous Q12H  . warfarin  10 mg Oral ONCE-1800  . warfarin   Does not apply Once  . Warfarin - Pharmacist Dosing Inpatient   Does not apply q1800   PRN: sodium chloride, acetaminophen, albuterol, morphine injection, ondansetron **OR** ondansetron (ZOFRAN) IV, oxyCODONE-acetaminophen, sodium chloride flush  Assessment: 6 yoF with PMH Lupus, asthma, and recent PE 12/25/16 discharged on Xarelto presents 6/8 with new onset CP and RLE pain with SOB/nausea. Pain from original PE had abated prior to this morning's events. Pharmacy consulted to dose warfarin with heparin bridge.   Baseline INR, aPTT: slightly elevated as expected on Xarelto  Prior anticoagulation: Xarelto  15 mg bid, last dose 6/8 at 930  AM  CTa on 6/8 confirms old clot in RLL and reveals new clot in RUL  Significant events: 6/8 pt vomited after receiving 10mg  warfarin subsequently another 5mg  dose was given Today, 12/30/2016:  HL and APTT elevated  INR subtherapeutic  No bleeding or infusion issues per nursing  Interacting medications: none major  CrCl: > 90 ml/min  Diet ordered  Goal of Therapy: Heparin level 0.3-0.7 units/ml INR 2-3 Monitor platelets by anticoagulation protocol: Yes  Plan:  Hold heparin x 1 hour then re-start at 1700 units/hr  Heparin/APTT level in 6 hours after restart  Warfarin 10 mg x 1 today  Daily CBC and INR  Daily aPTT level while artificially elevated by Xarelto. Once aPTT and heparin level correlate, may switch to heparin level only  Monitor for signs of bleeding or thrombosis  Recommended duration of heparin-warfarin overlap is 5 days AND until at least 2 consecutive therapeutic INRs achieved   Arley Phenix RPh 12/30/2016, 1:33 PM Pager [phone]

## 2016-12-31 LAB — CBC
HCT: 36.5 % (ref 36.0–46.0)
Hemoglobin: 12 g/dL (ref 12.0–15.0)
MCH: 27 pg (ref 26.0–34.0)
MCHC: 32.9 g/dL (ref 30.0–36.0)
MCV: 82 fL (ref 78.0–100.0)
PLATELETS: 365 10*3/uL (ref 150–400)
RBC: 4.45 MIL/uL (ref 3.87–5.11)
RDW: 14.1 % (ref 11.5–15.5)
WBC: 7.8 10*3/uL (ref 4.0–10.5)

## 2016-12-31 LAB — PROTIME-INR
INR: 1.12
PROTHROMBIN TIME: 14.5 s (ref 11.4–15.2)

## 2016-12-31 LAB — HEPARIN LEVEL (UNFRACTIONATED): Heparin Unfractionated: 0.49 IU/mL (ref 0.30–0.70)

## 2016-12-31 MED ORDER — WARFARIN SODIUM 5 MG PO TABS
10.0000 mg | ORAL_TABLET | Freq: Once | ORAL | Status: AC
  Administered 2016-12-31: 10 mg via ORAL
  Filled 2016-12-31: qty 2

## 2016-12-31 MED ORDER — ENOXAPARIN SODIUM 150 MG/ML ~~LOC~~ SOLN
150.0000 mg | Freq: Two times a day (BID) | SUBCUTANEOUS | Status: DC
  Administered 2016-12-31 – 2017-01-02 (×4): 150 mg via SUBCUTANEOUS
  Filled 2016-12-31 (×4): qty 1

## 2016-12-31 MED ORDER — ENOXAPARIN (LOVENOX) PATIENT EDUCATION KIT
PACK | Freq: Once | Status: AC
  Administered 2016-12-31: 18:00:00
  Filled 2016-12-31: qty 1

## 2016-12-31 NOTE — Progress Notes (Signed)
Pt had uneventful day. Lovenox teaching. Pt did a self injection. She did well. Teach back method. She will be able to successfully administer medication at home. Mother atnd brother at bedside as well. The mother wanted to learn as she may have to be back up for the patient

## 2016-12-31 NOTE — Progress Notes (Signed)
PROGRESS NOTE    Hayley Webb  ZOX:[id number] DOB: [birth year] DOA: 12/29/2016 PCP: Patient, No Pcp Per    Brief Narrative: Hayley Webb is a 31 y.o. female with medical history significant of Lupus, asthma, recent diagnosed of PE 6-05, discharge on Xarelto. Patient report complaint with xarelto. She develops right LE pain that wake her up. She report that 15 mints after she started to have worsening chest pain right side, crushing pain, 9/10, associated with SOB.   ED Course: presents with chest pain, vitals stable, CT angio showed new PE right upper lobe pulmonary artery. INR 1.5.    Assessment & Plan:   Active Problems:   Lupus   Pulmonary embolism (HCC)  1-Acute PE;  Patient with recent diagnosis 3 days ago, was taking xarelto. Presents with new onset chest pain. Repeated CT angio showed new PE.  I have discussed case with hematologist on called, difficult to say this is xarelto failure, but to be safe patient should be treated with Heparin/Lovenox --coumadin. We should check lupus anticoagulant, if positive patient will need to be on baby aspirin also.  -Doppler negative for DVT Continue Heparin gtt. --transition to lovenox.  -Coumadin per pharmacy.  -IV morphine for pain controlled.   2-Lupus ; continue with Methylprednisolone, and Plaquenil.   lupus anticoagulant pending.   3-Asthma; stable.  PRN albuterol.   4-Obesity;  Counseling provided to patient.   5-calf, thigh pain.  Report new pain, different to prior pain.  Check doppler, if she does have DVT will need to consult hematology    DVT prophylaxis: Heparin  Code Status: full code.  Family Communication: patient  Disposition Plan: remain inpatient  Consultants:   none   Procedures:  none   Antimicrobials:  none  Subjective: Chest pain stable, not worse.  Denies worsening dyspnea.  complaining of pain right thigh and left calf.     Objective: Vitals:   12/30/16 0503 12/30/16 1358  12/30/16 2156 12/31/16 0540  BP: 109/63 (!) 107/59 126/70 109/67  Pulse: 82 80 81 78  Resp: 16 16 16 20   Temp: 97.9 F (36.6 C) 97.9 F (36.6 C) 98.5 F (36.9 C) 98.4 F (36.9 C)  TempSrc: Oral Oral Oral Oral  SpO2: 98% 99% 97% 98%  Weight:      Height:        Intake/Output Summary (Last 24 hours) at 12/31/16 1326 Last data filed at 12/31/16 0200  Gross per 24 hour  Intake           839.25 ml  Output                0 ml  Net           839.25 ml   Filed Weights   12/29/16 1310  Weight: (!) 152.9 kg (337 lb)    Examination:  General exam: NAD Respiratory system: Respiratory effort normal. CTA Cardiovascular system: S 1, S 2 RRR Gastrointestinal system: BS present, soft, nt Central nervous system: alert non focal.  Extremities: Symmetric 5 x 5 power. Skin: No rashes, lesions or ulcers     Data Reviewed: I have personally reviewed following labs and imaging studies  CBC:  Recent Labs Lab 12/25/16 1830 12/26/16 0749 12/29/16 1335 12/30/16 0238 12/31/16 0542  WBC 5.8 7.1 5.7 7.2 7.8  NEUTROABS  --  2.4  --   --   --   HGB 12.4 11.6* 12.2 11.5* 12.0  HCT 37.7 35.6* 38.0 35.6* 36.5  MCV 82.9 83.0 82.8  83.0 82.0  PLT 399 368 405* 393 365   Basic Metabolic Panel:  Recent Labs Lab 12/25/16 1830 12/26/16 0749 12/29/16 1335 12/30/16 0238  NA 138 138 137 139  K 3.7 4.0 3.8 3.8  CL 104 104 104 105  CO2 24 26 25 27   GLUCOSE 94 99 97 98  BUN 9 10 14 12   CREATININE 0.76 0.72 0.69 0.73  CALCIUM 9.2 9.5 9.3 9.0   GFR: Estimated Creatinine Clearance: 166.8 mL/min (by C-G formula based on SCr of 0.73 mg/dL). Liver Function Tests:  Recent Labs Lab 12/29/16 1356  AST 29  ALT 24  ALKPHOS 106  BILITOT 0.4  PROT 7.5  ALBUMIN 3.7   No results for input(s): LIPASE, AMYLASE in the last 168 hours. No results for input(s): AMMONIA in the last 168 hours. Coagulation Profile:  Recent Labs Lab 12/26/16 0401 12/29/16 1356 12/30/16 0238 12/31/16 0542  INR  1.03 1.57 1.09 1.12   Cardiac Enzymes:  Recent Labs Lab 12/25/16 1830 12/25/16 2140 12/26/16 0749  TROPONINI <0.03 <0.03 <0.03   BNP (last 3 results) No results for input(s): PROBNP in the last 8760 hours. HbA1C: No results for input(s): HGBA1C in the last 72 hours. CBG: No results for input(s): GLUCAP in the last 168 hours. Lipid Profile: No results for input(s): CHOL, HDL, LDLCALC, TRIG, CHOLHDL, LDLDIRECT in the last 72 hours. Thyroid Function Tests: No results for input(s): TSH, T4TOTAL, FREET4, T3FREE, THYROIDAB in the last 72 hours. Anemia Panel: No results for input(s): VITAMINB12, FOLATE, FERRITIN, TIBC, IRON, RETICCTPCT in the last 72 hours. Sepsis Labs:  Recent Labs Lab 12/26/16 0749  LATICACIDVEN 0.8    No results found for this or any previous visit (from the past 240 hour(s)).       Radiology Studies: Dg Chest 2 View  Result Date: 12/29/2016 CLINICAL DATA:  Pulmonary emboli with shortness of Breath EXAM: CHEST  2 VIEW COMPARISON:  12/26/2016 FINDINGS: Cardiac shadow is within normal limits. The lungs are clear bilaterally. No bony abnormality is seen. IMPRESSION: No active cardiopulmonary disease. Electronically Signed   By: Alcide CleverMark  Lukens M.D.   On: 12/29/2016 14:44   Ct Angio Chest Pe W Or Wo Contrast  Result Date: 12/29/2016 CLINICAL DATA:  Diagnosed with pulmonary emboli on a CTA chest on 12/26/2016, discharged on Tuesday, returned today with increased pain 10 times worsened LEFT chest beginning this morning at 0900 hours, shortness of breath, dizziness, history asthma, lupus EXAM: CT ANGIOGRAPHY CHEST WITH CONTRAST TECHNIQUE: Multidetector CT imaging of the chest was performed using the standard protocol during bolus administration of intravenous contrast. Multiplanar CT image reconstructions and MIPs were obtained to evaluate the vascular anatomy. Initial images following contrast showed subtle full opacification of the pulmonary arterial tree. Patient was  reinjected with additional contrast for repeat CTA imaging. CONTRAST:  180 cc Isovue 370 IV total between 2 injections (100 cc, 80 cc) COMPARISON:  12/26/2016 FINDINGS: Cardiovascular: Aorta normal caliber without aneurysm or dissection. Cardiac chambers unremarkable. No pericardial effusion. Pulmonary arterial opacification is adequate centrally on reinjection images. Degradation of image quality secondary to body habitus. New filling defects identified in RIGHT upper lobe pulmonary artery is series 10 images 79 and 88. Tiny LEFT upper lobe pulmonary arterial defect series 10, image 81, questionably present on previous exam. RV/LV ratio of 0.87. Mediastinum/Nodes: Esophagus unremarkable. Base of cervical region normal appearance. No definite thoracic adenopathy, with a few normal sized lymph superior mediastinal nodes identified. Lungs/Pleura: Scattered atelectasis in the lower lobes. Remaining  lungs clear. No pleural effusion or pneumothorax. Upper Abdomen: Upper abdomen grossly unremarkable. Musculoskeletal: Unremarkable Review of the MIP images confirms the above findings. IMPRESSION: New pulmonary embolus in a RIGHT upper lobe pulmonary arterial branch. Tiny persistent embolus in a RIGHT lower lobe pulmonary artery. Tiny probable pulmonary embolus in LEFT upper lobe, potentially present on previous exam. Findings called to Harolyn Rutherford PA on 12/29/2016 at 1645 hours. Electronically Signed   By: Ulyses Southward M.D.   On: 12/29/2016 16:45        Scheduled Meds: . docusate sodium  100 mg Oral BID  . hydroxychloroquine  200 mg Oral Daily  . methylPREDNISolone  4 mg Oral Daily  . multivitamin with minerals  1 tablet Oral Daily  . senna  1 tablet Oral BID  . sodium chloride flush  3 mL Intravenous Q12H  . sodium chloride flush  3 mL Intravenous Q12H  . warfarin  10 mg Oral ONCE-1800  . warfarin   Does not apply Once  . Warfarin - Pharmacist Dosing Inpatient   Does not apply q1800   Continuous  Infusions: . sodium chloride    . sodium chloride 75 mL/hr (12/30/16 1725)  . heparin 1,700 Units/hr (12/31/16 1258)     LOS: 2 days    Time spent: 35 minutes.     Alba Cory, MD [facility] Pager [phone]  If 7PM-7AM, please contact night-coverage [email or web address] Password [email or web address] 12/31/2016, 1:26 PM

## 2016-12-31 NOTE — Progress Notes (Signed)
ANTICOAGULATION CONSULT NOTE - Follow Up Consult  Pharmacy Consult for Heparin Indication: pulmonary embolus  Allergies  Allergen Reactions  . Bactrim Anaphylaxis and Hives  . Prednisone Anaphylaxis and Hives    Has been on Dexamethasone without issue.  . Nsaids     Has Lupus, reccommended to avoid NSAIDs  . Other Itching    Shrimp:throat itching "can eat other shellfish"  . Pineapple Itching  . Procardia [Nifedipine] Swelling    Swelling of tongue  . Vicodin [Hydrocodone-Acetaminophen] Hives and Swelling    Can take Percocet without difficulty    Patient Measurements: Height: 5\' 9"  (175.3 cm) Weight: (!) 337 lb (152.9 kg) IBW/kg (Calculated) : 66.2 Heparin Dosing Weight:   Vital Signs: Temp: 98.5 F (36.9 C) (06/09 2156) Temp Source: Oral (06/09 2156) BP: 126/70 (06/09 2156) Pulse Rate: 81 (06/09 2156)  Labs:  Recent Labs  12/29/16 1335  12/29/16 1356  12/30/16 0238 12/30/16 1128 12/30/16 2031  HGB 12.2  --   --   --  11.5*  --   --   HCT 38.0  --   --   --  35.6*  --   --   PLT 405*  --   --   --  393  --   --   APTT  --   < > 49*  --  109* 134* 98*  LABPROT  --   --  18.9*  --  14.1  --   --   INR  --   --  1.57  --  1.09  --   --   HEPARINUNFRC  --   --   --   < > 1.14* 0.90* 0.54  CREATININE 0.69  --   --   --  0.73  --   --   < > = values in this interval not displayed.  Estimated Creatinine Clearance: 166.8 mL/min (by C-G formula based on SCr of 0.73 mg/dL).   Medications:  Infusions:  . sodium chloride    . sodium chloride 75 mL/hr (12/30/16 1725)  . heparin 1,700 Units/hr (12/30/16 1430)    Assessment: Patient with heparin level and PTT correlating so will only use heparin level going forward.  No heparin issues noted.  Goal of Therapy:  Heparin level 0.3-0.7 units/ml Monitor platelets by anticoagulation protocol: Yes   Plan:  Continue heparin drip at current rate Recheck level with AM labs  Darlina GuysGrimsley Jr, Jacquenette ShoneJulian  Crowford 12/31/2016,2:53 AM

## 2016-12-31 NOTE — Progress Notes (Signed)
Pt C/O pain in bi back of thighs and left calf.  Dr. Carmell Austriaegaldo notified. New order for LE US.

## 2016-12-31 NOTE — Progress Notes (Addendum)
ANTICOAGULATION CONSULT NOTE  Pharmacy Consult for IV heparin + warfarin Indication: pulmonary embolus  Allergies  Allergen Reactions  . Bactrim Anaphylaxis and Hives  . Prednisone Anaphylaxis and Hives    Has been on Dexamethasone without issue.  . Nsaids     Has Lupus, reccommended to avoid NSAIDs  . Other Itching    Shrimp:throat itching "can eat other shellfish"  . Pineapple Itching  . Procardia [Nifedipine] Swelling    Swelling of tongue  . Vicodin [Hydrocodone-Acetaminophen] Hives and Swelling    Can take Percocet without difficulty    Patient Measurements: Height: 5\' 9"  (175.3 cm) Weight: (!) 337 lb (152.9 kg) IBW/kg (Calculated) : 66.2 Heparin Dosing Weight: 104 kg  Vital Signs: Temp: 98.4 F (36.9 C) (06/10 0540) Temp Source: Oral (06/10 0540) BP: 109/67 (06/10 0540) Pulse Rate: 78 (06/10 0540)  Labs:  Recent Labs  12/29/16 1335  12/29/16 1356  12/30/16 0238 12/30/16 1128 12/30/16 2031 12/31/16 0542  HGB 12.2  --   --   --  11.5*  --   --  12.0  HCT 38.0  --   --   --  35.6*  --   --  36.5  PLT 405*  --   --   --  393  --   --  365  APTT  --   < > 49*  --  109* 134* 98*  --   LABPROT  --   --  18.9*  --  14.1  --   --  14.5  INR  --   --  1.57  --  1.09  --   --  1.12  HEPARINUNFRC  --   --   --   < > 1.14* 0.90* 0.54 0.49  CREATININE 0.69  --   --   --  0.73  --   --   --   < > = values in this interval not displayed.  Estimated Creatinine Clearance: 166.8 mL/min (by C-G formula based on SCr of 0.73 mg/dL).   Medical History: Past Medical History:  Diagnosis Date  . Anxiety    doing good now  . Asthma   . Headache(784.0)   . HSV infection   . Infection    UTI  . Lupus    dx age 7  . STD (sexually transmitted disease) 02/2009   POSITIVE GC    Medications:  Prescriptions Prior to Admission  Medication Sig Dispense Refill Last Dose  . acetaminophen (TYLENOL) 500 MG tablet Take 1,000 mg by mouth every 6 (six) hours as needed.    12/28/2016 at Unknown time  . albuterol (PROVENTIL HFA;VENTOLIN HFA) 108 (90 Base) MCG/ACT inhaler Inhale 2 puffs into the lungs every 6 (six) hours as needed for wheezing or shortness of breath.   12/28/2016 at Unknown time  . albuterol (PROVENTIL) (2.5 MG/3ML) 0.083% nebulizer solution Take 2.5 mg by nebulization every 6 (six) hours as needed for wheezing or shortness of breath.   12/28/2016 at Unknown time  . Hydroxychloroquine Sulfate (PLAQUENIL PO) Take 200 mg by mouth daily.    12/29/2016 at 0930  . methylPREDNISolone (MEDROL) 2 MG tablet Take 4 mg by mouth daily.    12/29/2016 at Unknown time  . Multiple Vitamins-Minerals (MULTIVITAMIN ADULT PO) Take 1 tablet by mouth daily.   12/29/2016 at Unknown time  . Rivaroxaban 15 & 20 MG TBPK Take as directed on package: Start with one 15mg  tablet by mouth twice a day with food. On Day 22,  switch to one 20mg  tablet once a day with food. (Patient taking differently: Take 15 mg by mouth 2 (two) times daily. Take as directed on package: Start with one 15mg  tablet by mouth twice a day with food. On Day 22, switch to one 20mg  tablet once a day with food.) 51 each 0 12/29/2016 at 0930   Scheduled:  . docusate sodium  100 mg Oral BID  . hydroxychloroquine  200 mg Oral Daily  . methylPREDNISolone  4 mg Oral Daily  . multivitamin with minerals  1 tablet Oral Daily  . senna  1 tablet Oral BID  . sodium chloride flush  3 mL Intravenous Q12H  . sodium chloride flush  3 mL Intravenous Q12H  . warfarin   Does not apply Once  . Warfarin - Pharmacist Dosing Inpatient   Does not apply q1800   PRN: sodium chloride, acetaminophen, albuterol, morphine injection, ondansetron **OR** ondansetron (ZOFRAN) IV, oxyCODONE-acetaminophen, sodium chloride flush  Assessment: 89 yoF with PMH Lupus, asthma, and recent PE 12/25/16 discharged on Xarelto presents 6/8 with new onset CP and RLE pain with SOB/nausea. Pain from original PE had abated prior to this morning's events. Pharmacy consulted  to dose warfarin with heparin bridge.   Baseline INR, aPTT: slightly elevated as expected on Xarelto  Prior anticoagulation: Xarelto 15 mg bid, last dose 6/8 at 930  AM  CTa on 6/8 confirms old clot in RLL and reveals new clot in RUL  Significant events: 6/8 pt vomited after receiving 10mg  warfarin subsequently another 5mg  dose was given 6/9 Patient with heparin level and PTT correlating so will only use heparin level going forward.  Today, 12/31/2016:  HL therapeutic  INR subtherapeutic  No bleeding or infusion issues per nursing  Interacting medications: none major  CrCl: > 90 ml/min  Diet ordered  Goal of Therapy: Heparin level 0.3-0.7 units/ml INR 2-3 Monitor platelets by anticoagulation protocol: Yes  Plan:  Continue heparin drip at 1700 units/hr  Warfarin 10 mg x 1 today  Daily CBC and INR  Daily heparin level  Monitor for signs of bleeding or thrombosis  Recommended duration of heparin-warfarin overlap is 5 days AND until at least 2 consecutive therapeutic INRs achieved   Arley PhenixEllen Yann Biehn RPh 12/31/2016, 9:25 AM Pager [phone]  UPDATE: pharmacy consulted to change heparin drip to enoxaparin  Plan: -Stop heparin drip at 1700  -Stop heparin labs -At 1800 start enoxaparin 150mg  SQ q12h - Recommended duration of heparin/enoxaparin-warfarin overlap is 5 days AND until at least 2 consecutive therapeutic INRs achieved  Arley PhenixEllen Solangel Mcmanaway RPh 12/31/2016, 1:58 PM Pager [phone]

## 2017-01-01 ENCOUNTER — Inpatient Hospital Stay (HOSPITAL_COMMUNITY): Payer: 59

## 2017-01-01 ENCOUNTER — Ambulatory Visit: Payer: 59 | Admitting: Family Medicine

## 2017-01-01 DIAGNOSIS — I2690 Septic pulmonary embolism without acute cor pulmonale: Secondary | ICD-10-CM

## 2017-01-01 DIAGNOSIS — R0602 Shortness of breath: Secondary | ICD-10-CM

## 2017-01-01 DIAGNOSIS — Z86711 Personal history of pulmonary embolism: Secondary | ICD-10-CM

## 2017-01-01 DIAGNOSIS — Z7901 Long term (current) use of anticoagulants: Secondary | ICD-10-CM

## 2017-01-01 DIAGNOSIS — I2699 Other pulmonary embolism without acute cor pulmonale: Principal | ICD-10-CM

## 2017-01-01 DIAGNOSIS — M79609 Pain in unspecified limb: Secondary | ICD-10-CM

## 2017-01-01 LAB — ECHOCARDIOGRAM COMPLETE
AOPV: 0.58 m/s
AV Area VTI: 1.83 cm2
AV Peak grad: 10 mmHg
AV peak Index: 0.71
AVPKVEL: 159 cm/s
CHL CUP DOP CALC LVOT VTI: 22.3 cm
CHL CUP MV DEC (S): 204
E decel time: 204 msec
EERAT: 5.72
FS: 44 % (ref 28–44)
Height: 69 in
IVS/LV PW RATIO, ED: 1.14
LA ID, A-P, ES: 33 mm
LA diam end sys: 33 mm
LA vol A4C: 72.4 ml
LADIAMINDEX: 1.28 cm/m2
LDCA: 3.14 cm2
LV PW d: 10.4 mm — AB (ref 0.6–1.1)
LV TDI E'LATERAL: 14.9
LV TDI E'MEDIAL: 8.7
LVEEAVG: 5.72
LVEEMED: 5.72
LVELAT: 14.9 cm/s
LVOT SV: 70 mL
LVOT diameter: 20 mm
LVOT peak vel: 92.5 cm/s
Lateral S' vel: 12.7 cm/s
MV Peak grad: 3 mmHg
MV pk A vel: 62.9 m/s
MV pk E vel: 85.3 m/s
TAPSE: 31.1 mm
Weight: 5392 oz

## 2017-01-01 LAB — BASIC METABOLIC PANEL
ANION GAP: 7 (ref 5–15)
BUN: 11 mg/dL (ref 6–20)
CALCIUM: 8.5 mg/dL — AB (ref 8.9–10.3)
CO2: 25 mmol/L (ref 22–32)
Chloride: 107 mmol/L (ref 101–111)
Creatinine, Ser: 0.74 mg/dL (ref 0.44–1.00)
GFR calc non Af Amer: >60 mL/min (ref >60)
Glucose, Bld: 99 mg/dL (ref 65–99)
POTASSIUM: 3.4 mmol/L — AB (ref 3.5–5.1)
Sodium: 139 mmol/L (ref 135–145)

## 2017-01-01 LAB — TROPONIN I
Troponin I: <0.03 ng/mL (ref <0.03)
Troponin I: <0.03 ng/mL (ref <0.03)

## 2017-01-01 LAB — CBC
HEMATOCRIT: 35.6 % — AB (ref 36.0–46.0)
HEMOGLOBIN: 11.5 g/dL — AB (ref 12.0–15.0)
MCH: 26.4 pg (ref 26.0–34.0)
MCHC: 32.3 g/dL (ref 30.0–36.0)
MCV: 81.7 fL (ref 78.0–100.0)
Platelets: 343 10*3/uL (ref 150–400)
RBC: 4.36 MIL/uL (ref 3.87–5.11)
RDW: 14.2 % (ref 11.5–15.5)
WBC: 6.7 10*3/uL (ref 4.0–10.5)

## 2017-01-01 LAB — PROTIME-INR
INR: 1.22
PROTHROMBIN TIME: 15.5 s — AB (ref 11.4–15.2)

## 2017-01-01 MED ORDER — POTASSIUM CHLORIDE CRYS ER 20 MEQ PO TBCR
40.0000 meq | EXTENDED_RELEASE_TABLET | Freq: Once | ORAL | Status: AC
  Administered 2017-01-01: 40 meq via ORAL
  Filled 2017-01-01: qty 2

## 2017-01-01 MED ORDER — WARFARIN SODIUM 5 MG PO TABS
12.5000 mg | ORAL_TABLET | Freq: Once | ORAL | Status: AC
  Administered 2017-01-01: 12.5 mg via ORAL
  Filled 2017-01-01: qty 1

## 2017-01-01 NOTE — Progress Notes (Signed)
*  Preliminary Results* Bilateral lower extremity venous duplex completed. Bilateral lower extremities are negative for deep vein thrombosis. There is no evidence of Baker's cyst bilaterally.  There is no change when compared to previous studies from 12/26/16 and 12/29/16.  01/01/2017 2:06 PM Gertie FeyMichelle Ami Mally, BS, RVT, RDCS, RDMS

## 2017-01-01 NOTE — Progress Notes (Signed)
Patient called RN complaining of a pain that started in her leg and moved to her chest. Reports pain is 10/10 and that she is having difficulty breathing. Vitals retaken, prn IV morphine given, oxygen applied, EKG done and on call was notified. No new orders at this time will continue to monitor pt and pain level.

## 2017-01-01 NOTE — Progress Notes (Signed)
  Echocardiogram 2D Echocardiogram has been performed.  Hayley Webb T Hayley Webb 01/01/2017, 2:20 PM

## 2017-01-01 NOTE — Consult Note (Signed)
[facility] CONSULT NOTE  Patient Care Team: Patient, No Pcp Per as PCP - General (General Practice)  CHIEF COMPLAINTS/PURPOSE OF CONSULTATION:  PE  HISTORY OF PRESENTING ILLNESS:  Hayley Webb 31 y.o. female is here because of recent diagnosis of pulmonary embolism. Patient was first admitted with chest pain and dyspnea on 12/26/2016. CT of the chest revealed very small nonocclusive pulmonary emboli noted in both lower lobes. She was started on anticoagulation and was discharged home on Xarelto. She came in with worsening chest pain and leg pains and repeat CT chest on 12/30/2016 revealed a new PE in the right upper lobe pulmonary branch, she was placed on heparin drip and while she was on heparin she continued to have chest pain and leg pains. Initial Dopplers of the legs were negative. She was then switched to Lovenox injections and we are consulted to assist with her anticoagulation management.  When I examined the patient she was in the bed and her mother was at bedside. She told me that her grandmother had a blood clot. She has a history of lupus. Lupus anticoagulant testing is pending.  There was no history of recent long distance travel or being sedentary. No recent surgeries or procedures. I reviewed her records extensively and collaborated the history with the patient.  MEDICAL HISTORY:  Past Medical History:  Diagnosis Date  . Anxiety    doing good now  . Asthma   . Headache(784.0)   . HSV infection   . Infection    UTI  . Lupus    dx age 31  . STD (sexually transmitted disease) 02/2009   POSITIVE GC    SURGICAL HISTORY: Past Surgical History:  Procedure Laterality Date  . MOUTH SURGERY     2 teeth removed- 1 wisdom and 1 in front of it  . TONSILLECTOMY AND ADENOIDECTOMY  1998  . WISDOM TOOTH EXTRACTION      SOCIAL HISTORY: Social History   Social History  . Marital status: Single    Spouse name: N/A  . Number of children: N/A  . Years of  education: N/A   Occupational History  . Not on file.   Social History Main Topics  . Smoking status: Never Smoker  . Smokeless tobacco: Never Used  . Alcohol use No  . Drug use: No  . Sexual activity: Yes    Partners: Male    Birth control/ protection: None   Other Topics Concern  . Not on file   Social History Narrative  . No narrative on file    FAMILY HISTORY: Family History  Problem Relation Age of Onset  . Diabetes Maternal Aunt   . Cancer Maternal Grandmother 72       COLON CA  . Diabetes Maternal Grandmother   . Hypertension Maternal Grandfather   . Cancer Maternal Grandfather        prostate  . Asthma Mother   . Asthma Brother   . Cancer Paternal Grandmother        breast  . Anesthesia problems Neg Hx   . Hypotension Neg Hx   . Malignant hyperthermia Neg Hx   . Pseudochol deficiency Neg Hx     ALLERGIES:  is allergic to bactrim; prednisone; nsaids; other; pineapple; procardia [nifedipine]; and vicodin [hydrocodone-acetaminophen].  MEDICATIONS:  Current Facility-Administered Medications  Medication Dose Route Frequency Provider Last Rate Last Dose  . 0.9 %  sodium chloride infusion  250 mL Intravenous PRN Regalado, Belkys A, MD      .  acetaminophen (TYLENOL) tablet 1,000 mg  1,000 mg Oral Q6H PRN Regalado, Belkys A, MD      . albuterol (PROVENTIL) (2.5 MG/3ML) 0.083% nebulizer solution 3 mL  3 mL Inhalation Q6H PRN Regalado, Belkys A, MD      . docusate sodium (COLACE) capsule 100 mg  100 mg Oral BID Regalado, Belkys A, MD   100 mg at 01/01/17 0959  . enoxaparin (LOVENOX) injection 150 mg  150 mg Subcutaneous Q12H Regalado, Belkys A, MD   150 mg at 01/01/17 0511  . hydroxychloroquine (PLAQUENIL) tablet 200 mg  200 mg Oral Daily Regalado, Belkys A, MD   200 mg at 01/01/17 0959  . methylPREDNISolone (MEDROL) tablet 4 mg  4 mg Oral Daily Regalado, Belkys A, MD   4 mg at 01/01/17 0959  . morphine 2 MG/ML injection 2 mg  2 mg Intravenous Q3H PRN Regalado,  Belkys A, MD   2 mg at 01/01/17 1629  . multivitamin with minerals tablet 1 tablet  1 tablet Oral Daily Regalado, Belkys A, MD   1 tablet at 01/01/17 0959  . ondansetron (ZOFRAN) tablet 4 mg  4 mg Oral Q6H PRN Regalado, Belkys A, MD       Or  . ondansetron (ZOFRAN) injection 4 mg  4 mg Intravenous Q6H PRN Regalado, Belkys A, MD      . oxyCODONE-acetaminophen (PERCOCET/ROXICET) 5-325 MG per tablet 1 tablet  1 tablet Oral Q6H PRN Regalado, Belkys A, MD   1 tablet at 01/01/17 0330  . senna (SENOKOT) tablet 8.6 mg  1 tablet Oral BID Regalado, Belkys A, MD   8.6 mg at 01/01/17 0959  . sodium chloride flush (NS) 0.9 % injection 3 mL  3 mL Intravenous Q12H Regalado, Belkys A, MD   3 mL at 12/29/16 0300  . sodium chloride flush (NS) 0.9 % injection 3 mL  3 mL Intravenous Q12H Regalado, Belkys A, MD   3 mL at 12/30/16 2200  . sodium chloride flush (NS) 0.9 % injection 3 mL  3 mL Intravenous PRN Regalado, Belkys A, MD      . Warfarin - Pharmacist Dosing Inpatient   Does not apply q1800 Wofford, Deirdre Evener, RPH        REVIEW OF SYSTEMS:   Constitutional: Denies fevers, chills or abnormal night sweats Eyes: Denies blurriness of vision, double vision or watery eyes Ears, nose, mouth, throat, and face: Denies mucositis or sore throat Respiratory: Chest pain and shortness of breath to minimal exertion Cardiovascular: Denies palpitation, chest discomfort or lower extremity swelling Gastrointestinal:  Denies nausea, heartburn or change in bowel habits Skin: Denies abnormal skin rashes Lymphatics: Denies new lymphadenopathy or easy bruising Neurological:Denies numbness, tingling or new weaknesses Behavioral/Psych: Mood is stable, no new changes  All other systems were reviewed with the patient and are negative.  PHYSICAL EXAMINATION: ECOG PERFORMANCE STATUS: 2 - Symptomatic, <50% confined to bed  Vitals:   01/01/17 1007 01/01/17 1500  BP: (!) 139/95 136/90  Pulse: 77 87  Resp:    Temp:  97.7 F (36.5 C)    Filed Weights   12/29/16 1310  Weight: (!) 337 lb (152.9 kg)    GENERAL:alert, no distress and comfortable SKIN: skin color, texture, turgor are normal, no rashes or significant lesions EYES: normal, conjunctiva are pink and non-injected, sclera clear OROPHARYNX:no exudate, no erythema and lips, buccal mucosa, and tongue normal  NECK: supple, thyroid normal size, non-tender, without nodularity LYMPH:  no palpable lymphadenopathy in the cervical, axillary or  inguinal LUNGS: clear to auscultation and percussion with normal breathing effort HEART: regular rate & rhythm and no murmurs and no lower extremity edema ABDOMEN:abdomen soft, non-tender and normal bowel sounds Musculoskeletal:no cyanosis of digits and no clubbing  PSYCH: alert & oriented x 3 with fluent speech NEURO: no focal motor/sensory deficits   LABORATORY DATA:  I have reviewed the data as listed Lab Results  Component Value Date   WBC 6.7 01/01/2017   HGB 11.5 (L) 01/01/2017   HCT 35.6 (L) 01/01/2017   MCV 81.7 01/01/2017   PLT 343 01/01/2017   Lab Results  Component Value Date   NA 139 01/01/2017   K 3.4 (L) 01/01/2017   CL 107 01/01/2017   CO2 25 01/01/2017    RADIOGRAPHIC STUDIES: I have personally reviewed the radiological reports and agreed with the findings in the report.  ASSESSMENT AND PLAN:  Acute pulmonary emboli: I agree with the current treatment plan with low molecular weight heparin. I recommend that she remain on Lovenox for at least 3 weeks. Subsequently I can switch her to an oral anticoagulation agent like Eliquis. I discussed with her that intermittent chest pains are quite common in the acute PE setting. The should improve slowly over time. I discussed the mechanism of anticoagulation and why he takes such a long time for the blood clots to breakdown. If she is positive for lupus anticoagulant that she may need retesting in 3 months. Only if repeat testing was normal then should be  considered to have antiphospholipid antibody syndrome.  Patient has an appointment to see me on 01/25/2017 at 8:45 AM Until then I recommend that she remain on Lovenox injections 1 mg/kg subcutaneous twice a day. I will consider switching her to oral anticoagulation at that time. Duration of anticoagulation will depend on lupus anticoagulant testing.  Thank you very much for allowing [place] to participate in her care.  All questions were answered. The patient knows to call the clinic with any problems, questions or concerns.    Sabas Sous, MD @T @

## 2017-01-01 NOTE — Progress Notes (Addendum)
ANTICOAGULATION CONSULT NOTE  Pharmacy Consult for LMWH + warfarin Indication: pulmonary embolus  Allergies  Allergen Reactions  . Bactrim Anaphylaxis and Hives  . Prednisone Anaphylaxis and Hives    Has been on Dexamethasone without issue.  . Nsaids     Has Lupus, reccommended to avoid NSAIDs  . Other Itching    Shrimp:throat itching "can eat other shellfish"  . Pineapple Itching  . Procardia [Nifedipine] Swelling    Swelling of tongue  . Vicodin [Hydrocodone-Acetaminophen] Hives and Swelling    Can take Percocet without difficulty    Patient Measurements: Height: 5\' 9"  (175.3 cm) Weight: (!) 337 lb (152.9 kg) IBW/kg (Calculated) : 66.2 Heparin Dosing Weight: 104 kg  Vital Signs: Temp: 98.3 F (36.8 C) (06/11 0609) Temp Source: Oral (06/11 0609) BP: 146/106 (06/11 0805) Pulse Rate: 78 (06/11 0805)  Labs:  Recent Labs  12/29/16 1335  12/30/16 0238 12/30/16 1128 12/30/16 2031 12/31/16 0542 01/01/17 0537  HGB 12.2  --  11.5*  --   --  12.0 11.5*  HCT 38.0  --  35.6*  --   --  36.5 35.6*  PLT 405*  --  393  --   --  365 343  APTT  --   < > 109* 134* 98*  --   --   LABPROT  --   < > 14.1  --   --  14.5 15.5*  INR  --   < > 1.09  --   --  1.12 1.22  HEPARINUNFRC  --   < > 1.14* 0.90* 0.54 0.49  --   CREATININE 0.69  --  0.73  --   --   --  0.74  < > = values in this interval not displayed.  Estimated Creatinine Clearance: 166.8 mL/min (by C-G formula based on SCr of 0.74 mg/dL).   Assessment: [age] yoF with PMH Lupus, asthma, and recent PE 12/25/16 discharged on Xarelto presents 6/8 with new onset CP and RLE pain with SOB/nausea. Pain from original PE had abated prior to this morning's events. Pharmacy consulted to dose warfarin with heparin bridge.   Baseline INR, aPTT: slightly elevated as expected on Xarelto  Prior anticoagulation: Xarelto 15 mg bid, last dose 6/8 at 930  AM  CTa on 6/8 confirms old clot in RLL and reveals new clot in RUL  Significant  events: 6/8 pt vomited after receiving 10mg  warfarin subsequently another 5mg  dose was given 6/9 Patient with heparin level and PTT correlating so will only use heparin level going forward.  Today, 01/01/2017:  INR subtherapeutic but trending up some after 3 doses of coumadin  No bleeding or infusion issues per nursing  Interacting medications: none major  CrCl: > 90 ml/min Diet ordered - eating 100% of meals  Goal of Therapy: Heparin level 0.3-0.7 units/ml INR 2-3 Monitor platelets by anticoagulation protocol: Yes  Plan:  Warfarin 12.5 mg x 1 today - consider dc home w/ Rx for 5 mg tablets - take 2.5 tablets = 12.5 mg daily and recheck INR Wed/Thursday  Continue on LMWH 150 mg sq q12h  Daily CBC and INR  Monitor for signs of bleeding or thrombosis  Recommended duration of LMWH-warfarin overlap is 5 days AND until at least 2 consecutive therapeutic INRs achieved  Herby AbrahamMichelle T. Nicolis Boody, Pharm.D. [phone] 01/01/2017 8:41 AM

## 2017-01-01 NOTE — Progress Notes (Signed)
PROGRESS NOTE    Hayley Webb  ZOX:[id number] DOB: 1985-10-11 DOA: 12/29/2016 PCP: Patient, No Pcp Per    Brief Narrative: Hayley Webb is a 31 y.o. female with medical history significant of Lupus, asthma, recent diagnosed of PE 6-05, discharge on Xarelto. Patient report complaint with xarelto. She develops right LE pain that wake her up. She report that 15 mints after she started to have worsening chest pain right side, crushing pain, 9/10, associated with SOB.   ED Course: presents with chest pain, vitals stable, CT angio showed new PE right upper lobe pulmonary artery. INR 1.5.    Assessment & Plan:   Active Problems:   Lupus   Pulmonary embolism (HCC)  1-Acute PE;  Patient with recent diagnosis 3 days ago, was taking xarelto. Presents with new onset chest pain. Repeated CT angio showed new PE.  - lupus anticoagulant pending, if positive patient will need to be on baby aspirin also.  -Doppler negative for DVT 6-8 Continue Heparin gtt. --transition to Lovenox 6-10 -Coumadin per pharmacy.  -IV morphine for pain controlled.  -Worsening chest pain; Will check chest x ray, troponin, ECHO.  -She will need to be bridge with Lovenox for at least 5 days and 24 hours after INR reached goal.  -will consult hematology/oncology / Patient with stable vital sign, no hypoxemia, normal BP.   2-Lupus ; continue with Methylprednisolone, and Plaquenil.   lupus anticoagulant pending.   3-Asthma; stable.  PRN albuterol.   4-Obesity;  Counseling provided to patient.   5-legs pain  Patient really worry about legs pain. Still with leg pain on and off.  Will repeat US.   DVT prophylaxis: Lovenox Code Status: full code.  Family Communication: patient  Disposition Plan: remain inpatient  Consultants:   none   Procedures:  none   Antimicrobials:  none  Subjective: She report having worsening chest pain this morning 10/10, associated with dyspnea. Also still with leg pain on  and off.  Vitals has been stable.      Objective: Vitals:   01/01/17 0510 01/01/17 0609 01/01/17 0805 01/01/17 1007  BP: 127/73 (!) 147/95 (!) 146/106 (!) 139/95  Pulse: 78 80 78 77  Resp: 20 (!) 22    Temp: 97.8 F (36.6 C) 98.3 F (36.8 C)    TempSrc: Oral Oral    SpO2: 98% 100% 100% 100%  Weight:      Height:        Intake/Output Summary (Last 24 hours) at 01/01/17 1027 Last data filed at 01/01/17 0700  Gross per 24 hour  Intake             2055 ml  Output                0 ml  Net             2055 ml   Filed Weights   12/29/16 1310  Weight: (!) 152.9 kg (337 lb)    Examination:  General exam: NAD Respiratory system: Respiratory effort normal, CTA Cardiovascular system: S 1, S 2 RRR Gastrointestinal system: BS present, soft, nt Central nervous system: Alert , non focal.  Extremities: symmetric power.  Skin: no rash      Data Reviewed: I have personally reviewed following labs and imaging studies  CBC:  Recent Labs Lab 12/26/16 0749 12/29/16 1335 12/30/16 0238 12/31/16 0542 01/01/17 0537  WBC 7.1 5.7 7.2 7.8 6.7  NEUTROABS 2.4  --   --   --   --  HGB 11.6* 12.2 11.5* 12.0 11.5*  HCT 35.6* 38.0 35.6* 36.5 35.6*  MCV 83.0 82.8 83.0 82.0 81.7  PLT 368 405* 393 365 343   Basic Metabolic Panel:  Recent Labs Lab 12/25/16 1830 12/26/16 0749 12/29/16 1335 12/30/16 0238 01/01/17 0537  NA 138 138 137 139 139  K 3.7 4.0 3.8 3.8 3.4*  CL 104 104 104 105 107  CO2 24 26 25 27 25   GLUCOSE 94 99 97 98 99  BUN 9 10 14 12 11   CREATININE 0.76 0.72 0.69 0.73 0.74  CALCIUM 9.2 9.5 9.3 9.0 8.5*   GFR: Estimated Creatinine Clearance: 166.8 mL/min (by C-G formula based on SCr of 0.74 mg/dL). Liver Function Tests:  Recent Labs Lab 12/29/16 1356  AST 29  ALT 24  ALKPHOS 106  BILITOT 0.4  PROT 7.5  ALBUMIN 3.7   No results for input(s): LIPASE, AMYLASE in the last 168 hours. No results for input(s): AMMONIA in the last 168 hours. Coagulation  Profile:  Recent Labs Lab 12/26/16 0401 12/29/16 1356 12/30/16 0238 12/31/16 0542 01/01/17 0537  INR 1.03 1.57 1.09 1.12 1.22   Cardiac Enzymes:  Recent Labs Lab 12/25/16 1830 12/25/16 2140 12/26/16 0749  TROPONINI <0.03 <0.03 <0.03   BNP (last 3 results) No results for input(s): PROBNP in the last 8760 hours. HbA1C: No results for input(s): HGBA1C in the last 72 hours. CBG: No results for input(s): GLUCAP in the last 168 hours. Lipid Profile: No results for input(s): CHOL, HDL, LDLCALC, TRIG, CHOLHDL, LDLDIRECT in the last 72 hours. Thyroid Function Tests: No results for input(s): TSH, T4TOTAL, FREET4, T3FREE, THYROIDAB in the last 72 hours. Anemia Panel: No results for input(s): VITAMINB12, FOLATE, FERRITIN, TIBC, IRON, RETICCTPCT in the last 72 hours. Sepsis Labs:  Recent Labs Lab 12/26/16 0749  LATICACIDVEN 0.8    No results found for this or any previous visit (from the past 240 hour(s)).       Radiology Studies: No results found.      Scheduled Meds: . docusate sodium  100 mg Oral BID  . enoxaparin (LOVENOX) injection  150 mg Subcutaneous Q12H  . hydroxychloroquine  200 mg Oral Daily  . methylPREDNISolone  4 mg Oral Daily  . multivitamin with minerals  1 tablet Oral Daily  . senna  1 tablet Oral BID  . sodium chloride flush  3 mL Intravenous Q12H  . sodium chloride flush  3 mL Intravenous Q12H  . Warfarin - Pharmacist Dosing Inpatient   Does not apply q1800   Continuous Infusions: . sodium chloride       LOS: 3 days    Time spent: 35 minutes.     Alba Coryegalado, Allin Frix A, MD [facility] Pager [phone]  If 7PM-7AM, please contact night-coverage [email or web address] Password [email or web address] 01/01/2017, 10:27 AM

## 2017-01-02 MED ORDER — SENNA 8.6 MG PO TABS: 1.0000 | ORAL_TABLET | Freq: Two times a day (BID) | ORAL | 0 refills | Status: DC

## 2017-01-02 MED ORDER — ENOXAPARIN SODIUM 150 MG/ML ~~LOC~~ SOLN: 150.0000 mg | Freq: Two times a day (BID) | SUBCUTANEOUS | 1 refills | Status: DC

## 2017-01-02 MED ORDER — OXYCODONE-ACETAMINOPHEN 5-325 MG PO TABS
1.0000 | ORAL_TABLET | Freq: Four times a day (QID) | ORAL | 0 refills | Status: DC | PRN
Start: 2017-01-02 — End: 2017-09-19

## 2017-01-02 NOTE — Discharge Summary (Addendum)
Physician Discharge Summary  Hayley Webb ZOX:[id number] DOB: [birth year] DOA: 12/29/2016  PCP: Patient, No Pcp Per  Admit date: 12/29/2016 Discharge date: 01/02/2017  Admitted From: Home Disposition:  Home  Recommendations for Outpatient Follow-up:  1. Follow up with PCP in 1-2 weeks 2. Please obtain BMP/CBC in one week 3. Follow antiphospholipid syndrome test results.  4. Follow up with Dr Pamelia Hoit    Discharge Condition: Stable.  CODE STATUS: Full code.  Diet recommendation: Heart Healthy  Brief/Interim Summary: Hayley Webb a 31 y.o.femalewith medical history significant of Lupus, asthma, recent diagnosed of PE 6-05, discharge on Xarelto. Patient report complaint with xarelto. She develops right LE pain that wake her up. She report that 15 mints after she started to have worsening chest pain right side, crushing pain, 9/10, associated with SOB.   ED Course:presents with chest pain, vitals stable, CT angio showed new PE right upper lobe pulmonary artery. INR 1.5.    Assessment & Plan:   Active Problems:   Lupus   Pulmonary embolism (HCC)  1-Acute PE;  Patient with recent diagnosis 3 days ago, was taking xarelto. Presents with new onset chest pain. Repeated CT angio showed new PE.  - lupus anticoagulant pending, if positive patient will need to be on baby aspirin also.  -Doppler negative for DVT 6-8 Treated initially Heparin gtt. --transition to Lovenox 6-10 -Coumadin per pharmacy.  -IV morphine for pain controlled. Discharge on percocet.  ECHO normal, Doppler negative.  Appreciate Dr Pamelia Hoit, plan is to discharge on LOvenox until she follows with Dr Pamelia Hoit. He will make the transition to oral anticoagulation   2-Lupus ; continue with Methylprednisolone, and Plaquenil.   lupus anticoagulant pending.   3-Asthma;stable.  PRN albuterol.   4-Morbid Obesity;  Counseling provided to patient.   5-legs pain  Patient really worry about legs pain. Still with leg  pain on and off.  [place] negative  Hypokalemia. Mild replaced.   Discharge Diagnoses:  Active Problems:   Lupus   Pulmonary embolism [facility])    Discharge Instructions  Discharge Instructions    Diet - low sodium heart healthy    Complete by:  As directed    Increase activity slowly    Complete by:  As directed      Allergies as of 01/02/2017      Reactions   Bactrim Anaphylaxis, Hives   Prednisone Anaphylaxis, Hives   Has been on Dexamethasone without issue.   Nsaids    Has Lupus, reccommended to avoid NSAIDs   Other Itching   Shrimp:throat itching "can eat other shellfish"   Pineapple Itching   Procardia [nifedipine] Swelling   Swelling of tongue   Vicodin [hydrocodone-acetaminophen] Hives, Swelling   Can take Percocet without difficulty      Medication List    STOP taking these medications   Rivaroxaban 15 & 20 MG Tbpk     TAKE these medications   acetaminophen 500 MG tablet Commonly known as:  TYLENOL Take 1,000 mg by mouth every 6 (six) hours as needed.   albuterol 108 (90 Base) MCG/ACT inhaler Commonly known as:  PROVENTIL HFA;VENTOLIN HFA Inhale 2 puffs into the lungs every 6 (six) hours as needed for wheezing or shortness of breath.   albuterol (2.5 MG/3ML) 0.083% nebulizer solution Commonly known as:  PROVENTIL Take 2.5 mg by nebulization every 6 (six) hours as needed for wheezing or shortness of breath.   enoxaparin 150 MG/ML injection Commonly known as:  LOVENOX Inject 1 mL (150 mg total) into the  skin every 12 (twelve) hours.   methylPREDNISolone 2 MG tablet Commonly known as:  MEDROL Take 4 mg by mouth daily.   MULTIVITAMIN ADULT PO Take 1 tablet by mouth daily.   oxyCODONE-acetaminophen 5-325 MG tablet Commonly known as:  PERCOCET/ROXICET Take 1 tablet by mouth every 6 (six) hours as needed for moderate pain.   PLAQUENIL PO Take 200 mg by mouth daily.   senna 8.6 MG Tabs tablet Commonly known as:  SENOKOT Take 1 tablet (8.6 mg total)  by mouth 2 (two) times daily.       Allergies  Allergen Reactions  . Bactrim Anaphylaxis and Hives  . Prednisone Anaphylaxis and Hives    Has been on Dexamethasone without issue.  . Nsaids     Has Lupus, reccommended to avoid NSAIDs  . Other Itching    Shrimp:throat itching "can eat other shellfish"  . Pineapple Itching  . Procardia [Nifedipine] Swelling    Swelling of tongue  . Vicodin [Hydrocodone-Acetaminophen] Hives and Swelling    Can take Percocet without difficulty    Consultations:  Dr Pamelia Hoit   Procedures/Studies: Dg Chest 2 View  Result Date: 12/29/2016 CLINICAL DATA:  Pulmonary emboli with shortness of Breath EXAM: CHEST  2 VIEW COMPARISON:  12/26/2016 FINDINGS: Cardiac shadow is within normal limits. The lungs are clear bilaterally. No bony abnormality is seen. IMPRESSION: No active cardiopulmonary disease. Electronically Signed   By: Alcide Clever M.D.   On: 12/29/2016 14:44   Dg Chest 2 View  Result Date: 12/25/2016 CLINICAL DATA:  Chest pain and shortness of breath. EXAM: CHEST  2 VIEW COMPARISON:  05/10/2016 FINDINGS: The heart size and mediastinal contours are within normal limits. Both lungs are clear. The visualized skeletal structures are unremarkable. IMPRESSION: No active cardiopulmonary disease. Electronically Signed   By: Francene Boyers M.D.   On: 12/25/2016 18:49   Ct Angio Chest Pe W Or Wo Contrast  Result Date: 12/29/2016 CLINICAL DATA:  Diagnosed with pulmonary emboli on a CTA chest on 12/26/2016, discharged on Tuesday, returned today with increased pain 10 times worsened LEFT chest beginning this morning at 0900 hours, shortness of breath, dizziness, history asthma, lupus EXAM: CT ANGIOGRAPHY CHEST WITH CONTRAST TECHNIQUE: Multidetector CT imaging of the chest was performed using the standard protocol during bolus administration of intravenous contrast. Multiplanar CT image reconstructions and MIPs were obtained to evaluate the vascular anatomy. Initial  images following contrast showed subtle full opacification of the pulmonary arterial tree. Patient was reinjected with additional contrast for repeat CTA imaging. CONTRAST:  180 cc Isovue 370 IV total between 2 injections (100 cc, 80 cc) COMPARISON:  12/26/2016 FINDINGS: Cardiovascular: Aorta normal caliber without aneurysm or dissection. Cardiac chambers unremarkable. No pericardial effusion. Pulmonary arterial opacification is adequate centrally on reinjection images. Degradation of image quality secondary to body habitus. New filling defects identified in RIGHT upper lobe pulmonary artery is series 10 images 79 and 88. Tiny LEFT upper lobe pulmonary arterial defect series 10, image 81, questionably present on previous exam. RV/LV ratio of 0.87. Mediastinum/Nodes: Esophagus unremarkable. Base of cervical region normal appearance. No definite thoracic adenopathy, with a few normal sized lymph superior mediastinal nodes identified. Lungs/Pleura: Scattered atelectasis in the lower lobes. Remaining lungs clear. No pleural effusion or pneumothorax. Upper Abdomen: Upper abdomen grossly unremarkable. Musculoskeletal: Unremarkable Review of the MIP images confirms the above findings. IMPRESSION: New pulmonary embolus in a RIGHT upper lobe pulmonary arterial branch. Tiny persistent embolus in a RIGHT lower lobe pulmonary artery. Tiny probable pulmonary  embolus in LEFT upper lobe, potentially present on previous exam. Findings called to Harolyn RutherfordShawn Joy PA on 12/29/2016 at 1645 hours. Electronically Signed   By: Ulyses SouthwardMark  Boles M.D.   On: 12/29/2016 16:45   Ct Angio Chest Pe W Or Wo Contrast  Result Date: 12/26/2016 CLINICAL DATA:  Chest pain x1 week with dyspnea.  Positive D-dimer. EXAM: CT ANGIOGRAPHY CHEST WITH CONTRAST TECHNIQUE: Multidetector CT imaging of the chest was performed using the standard protocol during bolus administration of intravenous contrast. Multiplanar CT image reconstructions and MIPs were obtained to  evaluate the vascular anatomy. CONTRAST:  100 cc Isovue 370 COMPARISON:  None. FINDINGS: Cardiovascular: Study is limited by patient body habitus. There for tiny pulmonary emboli to the lower lobes representative image series 5, image 59 for example. No large central pulmonary embolus. RV/LV ratio equals 0.74. No pericardial effusion. Top normal size cardiac chambers. No aortic aneurysm or dissection. Mediastinum/Nodes: No enlarged mediastinal, hilar, or axillary lymph nodes. Thyroid gland, trachea, and esophagus demonstrate no significant findings. Lungs/Pleura: Lungs are clear. No pleural effusion or pneumothorax. Upper Abdomen: No acute abnormality. Musculoskeletal: No chest wall abnormality. No acute or significant osseous findings. Review of the MIP images confirms the above findings. IMPRESSION: Tiny nonocclusive pulmonary emboli noted to the lower lobes. Borderline right heart strain with RV/LV ratio 0.74. Critical Value/emergent results were called by telephone at the time of interpretation on 12/26/2016 at 3:02 am to clinician Ivar Drapeob Browning, who verbally acknowledged these results. Electronically Signed   By: Tollie Ethavid  Kwon M.D.   On: 12/26/2016 03:02   Dg Chest Port 1 View  Result Date: 01/01/2017 CLINICAL DATA:  Shortness of breath and chest pain EXAM: PORTABLE CHEST 1 VIEW COMPARISON:  Chest radiograph December 29, 2016 and chest CT December 29, 2016 FINDINGS: The lungs are clear. Heart is mildly enlarged with pulmonary vascularity within normal limits. No adenopathy. No bone lesions. IMPRESSION: No edema or consolidation.  Heart mildly enlarged. Electronically Signed   By: Bretta BangWilliam  Woodruff III M.D.   On: 01/01/2017 12:11       Subjective: She is feeling better, chest pain controlled.   Discharge Exam: Vitals:   01/01/17 1500 01/02/17 0725  BP: 136/90 (!) 142/99  Pulse: 87 74  Resp:  20  Temp: 97.7 F (36.5 C) 98.2 F (36.8 C)   Vitals:   01/01/17 0805 01/01/17 1007 01/01/17 1500 01/02/17 0725   BP: (!) 146/106 (!) 139/95 136/90 (!) 142/99  Pulse: 78 77 87 74  Resp:    20  Temp:   97.7 F (36.5 C) 98.2 F (36.8 C)  TempSrc:   Oral Oral  SpO2: 100% 100% 100% 100%  Weight:      Height:        General: Pt is alert, awake, not in acute distress Cardiovascular: RRR, S1/S2 +, no rubs, no gallops Respiratory: CTA bilaterally, no wheezing, no rhonchi Abdominal: Soft, NT, ND, bowel sounds + Extremities: no edema, no cyanosis    The results of significant diagnostics from this hospitalization (including imaging, microbiology, ancillary and laboratory) are listed below for reference.     Microbiology: No results found for this or any previous visit (from the past 240 hour(s)).   Labs: BNP (last 3 results)  Recent Labs  12/26/16 0749  BNP 13.2   Basic Metabolic Panel:  Recent Labs Lab 12/29/16 1335 12/30/16 0238 01/01/17 0537  NA 137 139 139  K 3.8 3.8 3.4*  CL 104 105 107  CO2 25 27 25  GLUCOSE 97 98 99  BUN 14 12 11   CREATININE 0.69 0.73 0.74  CALCIUM 9.3 9.0 8.5*   Liver Function Tests:  Recent Labs Lab 12/29/16 1356  AST 29  ALT 24  ALKPHOS 106  BILITOT 0.4  PROT 7.5  ALBUMIN 3.7   No results for input(s): LIPASE, AMYLASE in the last 168 hours. No results for input(s): AMMONIA in the last 168 hours. CBC:  Recent Labs Lab 12/29/16 1335 12/30/16 0238 12/31/16 0542 01/01/17 0537  WBC 5.7 7.2 7.8 6.7  HGB 12.2 11.5* 12.0 11.5*  HCT 38.0 35.6* 36.5 35.6*  MCV 82.8 83.0 82.0 81.7  PLT 405* 393 365 343   Cardiac Enzymes:  Recent Labs Lab 01/01/17 1028 01/01/17 1553 01/01/17 2153  TROPONINI <0.03 <0.03 <0.03   BNP: Invalid input(s): POCBNP CBG: No results for input(s): GLUCAP in the last 168 hours. D-Dimer No results for input(s): DDIMER in the last 72 hours. Hgb A1c No results for input(s): HGBA1C in the last 72 hours. Lipid Profile No results for input(s): CHOL, HDL, LDLCALC, TRIG, CHOLHDL, LDLDIRECT in the last 72  hours. Thyroid function studies No results for input(s): TSH, T4TOTAL, T3FREE, THYROIDAB in the last 72 hours.  Invalid input(s): FREET3 Anemia work up No results for input(s): VITAMINB12, FOLATE, FERRITIN, TIBC, IRON, RETICCTPCT in the last 72 hours. Urinalysis    Component Value Date/Time   COLORURINE YELLOW 05/04/2016 2305   APPEARANCEUR CLEAR 05/04/2016 2305   LABSPEC >1.030 (H) 05/04/2016 2305   PHURINE 6.0 05/04/2016 2305   GLUCOSEU NEGATIVE 05/04/2016 2305   HGBUR MODERATE (A) 05/04/2016 2305   BILIRUBINUR NEGATIVE 05/04/2016 2305   KETONESUR NEGATIVE 05/04/2016 2305   PROTEINUR NEGATIVE 05/04/2016 2305   UROBILINOGEN 1.0 04/28/2015 1930   NITRITE NEGATIVE 05/04/2016 2305   LEUKOCYTESUR NEGATIVE 05/04/2016 2305   Sepsis Labs Invalid input(s): PROCALCITONIN,  WBC,  LACTICIDVEN Microbiology No results found for this or any previous visit (from the past 240 hour(s)).   Time coordinating discharge: Over 30 minutes  SIGNED:   Alba Cory, MD  [facility] 01/02/2017, 9:37 AM Pager [phone]  If 7PM-7AM, please contact night-coverage [email or web address] Password [email or web address]

## 2017-01-02 NOTE — Plan of Care (Signed)
Problem: Pain Managment: Goal: General experience of comfort will improve Outcome: Not Progressing Pt still reporting pain 7or >.

## 2017-01-02 NOTE — Progress Notes (Signed)
Pt able to give herself Lovenox inj this am.

## 2017-01-02 NOTE — Care Management Note (Signed)
Case Management Note  Patient Details  Name: Purvis Sheffieldndrea Foster MRN: [id number] Date of Birth: April 23, 1986  Subjective/Objective: 31 y/o f admitted w/PE. From home. Benefit check-enoxaparin/lovenox-$15/$180.70-MD notified. No further CM needs.                   Action/Plan:d/c home.   Expected Discharge Date:  01/02/17               Expected Discharge Plan:  Home/Self Care  In-House Referral:     Discharge planning Services  CM Consult  Post Acute Care Choice:    Choice offered to:     DME Arranged:    DME Agency:     HH Arranged:    HH Agency:     Status of Service:  Completed, signed off  If discussed at [employer] Length of Stay Meetings, dates discussed:    Additional Comments:  Lanier ClamMahabir, Rica Heather, RN 01/02/2017, 10:26 AM

## 2017-01-03 ENCOUNTER — Encounter: Payer: Self-pay | Admitting: Hematology and Oncology

## 2017-01-03 LAB — LUPUS ANTICOAGULANT
DRVVT: 114.7 s — ABNORMAL HIGH (ref 0.0–47.0)
PTT Lupus Anticoagulant: 61.4 s — ABNORMAL HIGH (ref 0.0–51.9)
Thrombin Time: 30.7 s — ABNORMAL HIGH (ref 0.0–23.0)
dPT Confirm Ratio: 1.01 Ratio (ref 0.00–1.40)
dPT: 61 s — ABNORMAL HIGH (ref 0.0–55.0)

## 2017-01-03 LAB — PTT-LA MIX: PTT-LA MIX: 53.6 s — AB (ref 0.0–48.9)

## 2017-01-03 LAB — HEXAGONAL PHASE PHOSPHOLIPID: Hexagonal Phase Phospholipid: 0 s (ref 0–11)

## 2017-01-03 LAB — TT MIX+TTN
THROMBIN NEUTRALIZATION: 18.4 s (ref 0.0–23.0)
THROMBIN TIME MIX: 24.1 s — AB (ref 0.0–23.0)

## 2017-01-03 LAB — DRVVT CONFIRM: DRVVT CONFIRM: 2 ratio — AB (ref 0.8–1.2)

## 2017-01-03 LAB — DRVVT MIX: dRVVT Mix: 67.8 s — ABNORMAL HIGH (ref 0.0–47.0)

## 2017-01-05 ENCOUNTER — Encounter (HOSPITAL_COMMUNITY): Payer: Self-pay | Admitting: Emergency Medicine

## 2017-01-05 ENCOUNTER — Encounter: Payer: Self-pay | Admitting: Physician Assistant

## 2017-01-05 ENCOUNTER — Emergency Department (HOSPITAL_COMMUNITY): Payer: 59

## 2017-01-05 ENCOUNTER — Ambulatory Visit (INDEPENDENT_AMBULATORY_CARE_PROVIDER_SITE_OTHER): Payer: 59 | Admitting: Physician Assistant

## 2017-01-05 ENCOUNTER — Emergency Department (HOSPITAL_COMMUNITY)
Admission: EM | Admit: 2017-01-05 | Discharge: 2017-01-05 | Disposition: A | Discharge status: Home / Self Care | Payer: 59 | Attending: Emergency Medicine | Admitting: Emergency Medicine

## 2017-01-05 VITALS — BP 128/80 | HR 109 | Temp 98.4°F | Resp 16 | Ht 68.75 in | Wt 338.8 lb

## 2017-01-05 DIAGNOSIS — I2699 Other pulmonary embolism without acute cor pulmonale: Secondary | ICD-10-CM | POA: Diagnosis not present

## 2017-01-05 DIAGNOSIS — J45909 Unspecified asthma, uncomplicated: Secondary | ICD-10-CM | POA: Diagnosis not present

## 2017-01-05 DIAGNOSIS — I4892 Unspecified atrial flutter: Secondary | ICD-10-CM | POA: Diagnosis not present

## 2017-01-05 DIAGNOSIS — R0789 Other chest pain: Secondary | ICD-10-CM

## 2017-01-05 DIAGNOSIS — R000 Tachycardia, unspecified: Secondary | ICD-10-CM | POA: Diagnosis not present

## 2017-01-05 DIAGNOSIS — Z7951 Long term (current) use of inhaled steroids: Secondary | ICD-10-CM | POA: Diagnosis not present

## 2017-01-05 DIAGNOSIS — Z7901 Long term (current) use of anticoagulants: Secondary | ICD-10-CM | POA: Diagnosis not present

## 2017-01-05 DIAGNOSIS — Z86711 Personal history of pulmonary embolism: Secondary | ICD-10-CM | POA: Diagnosis not present

## 2017-01-05 DIAGNOSIS — R079 Chest pain, unspecified: Secondary | ICD-10-CM | POA: Diagnosis not present

## 2017-01-05 DIAGNOSIS — R0602 Shortness of breath: Secondary | ICD-10-CM | POA: Diagnosis not present

## 2017-01-05 LAB — POCT CBC
Granulocyte percent: 54.2 %G (ref 37–80)
HEMATOCRIT: 35.3 % — AB (ref 37.7–47.9)
HEMOGLOBIN: 12.2 g/dL (ref 12.2–16.2)
Lymph, poc: 2.6 (ref 0.6–3.4)
MCH: 27.7 pg (ref 27–31.2)
MCHC: 34.6 g/dL (ref 31.8–35.4)
MCV: 80.2 fL (ref 80–97)
MID (CBC): 0.2 (ref 0–0.9)
MPV: 7.1 fL (ref 0–99.8)
POC GRANULOCYTE: 3.4 (ref 2–6.9)
POC LYMPH PERCENT: 42 %L (ref 10–50)
POC MID %: 3.8 % (ref 0–12)
Platelet Count, POC: 361 10*3/uL (ref 142–424)
RBC: 4.4 M/uL (ref 4.04–5.48)
RDW, POC: 14.8 %
WBC: 6.3 10*3/uL (ref 4.6–10.2)

## 2017-01-05 LAB — CBC
HCT: 35.5 % — ABNORMAL LOW (ref 36.0–46.0)
HEMOGLOBIN: 11.5 g/dL — AB (ref 12.0–15.0)
MCH: 27 pg (ref 26.0–34.0)
MCHC: 32.4 g/dL (ref 30.0–36.0)
MCV: 83.3 fL (ref 78.0–100.0)
Platelets: 325 10*3/uL (ref 150–400)
RBC: 4.26 MIL/uL (ref 3.87–5.11)
RDW: 14.3 % (ref 11.5–15.5)
WBC: 6.5 10*3/uL (ref 4.0–10.5)

## 2017-01-05 LAB — BASIC METABOLIC PANEL
Anion gap: 6 (ref 5–15)
BUN: 10 mg/dL (ref 6–20)
CO2: 24 mmol/L (ref 22–32)
CREATININE: 0.72 mg/dL (ref 0.44–1.00)
Calcium: 8.7 mg/dL — ABNORMAL LOW (ref 8.9–10.3)
Chloride: 106 mmol/L (ref 101–111)
GFR calc Af Amer: >60 mL/min (ref >60)
GFR calc non Af Amer: >60 mL/min (ref >60)
GLUCOSE: 86 mg/dL (ref 65–99)
Potassium: 3.7 mmol/L (ref 3.5–5.1)
SODIUM: 136 mmol/L (ref 135–145)

## 2017-01-05 LAB — GLUCOSE, POCT (MANUAL RESULT ENTRY): POC Glucose: 110 mg/dl — AB (ref 70–99)

## 2017-01-05 MED ORDER — IOPAMIDOL (ISOVUE-370) INJECTION 76%
INTRAVENOUS | Status: AC
  Administered 2017-01-05: 100 mL
  Filled 2017-01-05: qty 100

## 2017-01-05 MED ORDER — ENOXAPARIN SODIUM 150 MG/ML ~~LOC~~ SOLN: 1.0000 mg/kg | Freq: Once | SUBCUTANEOUS | Status: DC

## 2017-01-05 MED ORDER — ENOXAPARIN SODIUM 150 MG/ML ~~LOC~~ SOLN
150.0000 mg | Freq: Once | SUBCUTANEOUS | Status: AC
  Administered 2017-01-05: 150 mg via SUBCUTANEOUS
  Filled 2017-01-05: qty 1

## 2017-01-05 NOTE — ED Provider Notes (Addendum)
[facility]-EMERGENCY DEPT Provider Note   CSN: [id number] Arrival date & time: 01/05/17  1606     History   Chief Complaint Chief Complaint  Patient presents with  . Chest Pain    HPI Hayley Webb is a 31 y.o. female with past medical history of lupus, recently diagnosed with pulmonary embolus on 12/26/2016. Patient was started on Xarelto, returned on 12/29/2016 and had a repeat CT that showed worsening clot burden. Patient was switched to Lovenox twice a day. She was following up with her PCP today and was sent over to the emergency department because she has worsening pain and shortness of breath. There is also some concern for atrial flutter on her EKG however, the patient has P waves throughout the 12-lead, and I feel that there was artifact which was misread by the EKG machine. The patient denies hemoptysis. She denies fevers, chills. She denies orthopnea. Patient had 3 lower extremity venous duplex on 6/5, one on 6/8, and 1 on 6/11 all of which were negative for DVTs.  HPI  Past Medical History:  Diagnosis Date  . Anxiety    doing good now  . Asthma   . Headache(784.0)   . HSV infection   . Infection    UTI  . Lupus    dx age 66  . Pulmonary embolism (HCC) 06/4/82011  . 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 Pulmonary embolism (HCC) 12/26/2016  . SOB (shortness of breath)   . Vaginal delivery--VE assist 10/25/2013  . 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 Obesity-BMI 47 05/09/2013  . Hirsutism 10/02/2012  . STD (female) 01/18/2012  . Lupus   . Asthma   . HSV infection     Past Surgical History:  Procedure Laterality Date  . MOUTH SURGERY     2 teeth removed- 1 wisdom and 1 in front of it  . TONSILLECTOMY AND ADENOIDECTOMY  1998  . WISDOM TOOTH EXTRACTION      OB History    Gravida Para Term Preterm AB Living   2 1 1    1 1    SAB TAB Ectopic Multiple Live Births   1       1       Home Medications    Prior to Admission medications   Medication Sig Start Date End Date Taking? Authorizing Provider  acetaminophen (TYLENOL) 500 MG tablet Take 1,000 mg by mouth every 6 (six) hours as needed.    [provider]  albuterol (PROVENTIL HFA;VENTOLIN HFA) 108 (90 Base) MCG/ACT inhaler Inhale 2 puffs into the lungs every 6 (six) hours as needed for wheezing or shortness of breath.    [provider]  albuterol (PROVENTIL) (2.5 MG/3ML) 0.083% nebulizer solution Take 2.5 mg by nebulization every 6 (six) hours as needed for wheezing or shortness of breath.    [provider]  enoxaparin (LOVENOX) 150 MG/ML injection Inject 1 mL (150 mg total) into the skin every 12 (twelve) hours. 01/02/17   Regalado, Belkys A, MD  Hydroxychloroquine Sulfate (PLAQUENIL PO) Take 200 mg by mouth daily.     [provider]  methylPREDNISolone (MEDROL) 2 MG tablet Take 4 mg by mouth daily.     [provider]  Multiple Vitamins-Minerals (MULTIVITAMIN ADULT PO) Take 1 tablet by mouth daily.    [provider]  oxyCODONE-acetaminophen (PERCOCET/ROXICET) 5-325 MG  tablet Take 1 tablet by mouth every 6 (six) hours as needed for moderate pain. 01/02/17   Regalado, Belkys A, MD  senna (SENOKOT) 8.6 MG TABS tablet Take 1 tablet (8.6 mg total) by mouth 2 (two) times daily. 01/02/17   Regalado, Prentiss BellsBelkys A, MD    Family History Family History  Problem Relation Age of Onset  . Diabetes Maternal Aunt   . Cancer Maternal Grandmother 72       COLON CA  . Diabetes Maternal Grandmother   . Hypertension Maternal Grandfather   . Cancer Maternal Grandfather        prostate  . Asthma Mother   . Asthma Brother   . Cancer Paternal Grandmother        breast  . Lupus Paternal Grandmother   . Anesthesia problems Neg Hx   . Hypotension Neg Hx   . Malignant hyperthermia Neg Hx   . Pseudochol deficiency Neg  Hx     Social History Social History  Substance Use Topics  . Smoking status: Never Smoker  . Smokeless tobacco: Never Used  . Alcohol use No     Allergies   Bactrim; Prednisone; Nsaids; Other; Pineapple; Procardia [nifedipine]; and Vicodin [hydrocodone-acetaminophen]   Review of Systems Review of Systems Ten systems reviewed and are negative for acute change, except as noted in the HPI.    Physical Exam Updated Vital Signs BP 115/77   Pulse 86   Temp 98 F (36.7 C) (Oral)   Resp (!) 23   Ht 5\' 9"  (1.753 m)   Wt (!) 153.3 kg (338 lb)   LMP 12/11/2016   SpO2 99%   BMI 49.91 kg/m   Physical Exam  Constitutional: She is oriented to person, place, and time. She appears well-developed and well-nourished. No distress.  HENT:  Head: Normocephalic and atraumatic.  Eyes: Conjunctivae are normal. No scleral icterus.  Neck: Normal range of motion.  Cardiovascular: Normal rate, regular rhythm and normal heart sounds.  Exam reveals no gallop and no friction rub.   No murmur heard. Pulmonary/Chest: Effort normal and breath sounds normal. No respiratory distress.  Abdominal: Soft. Bowel sounds are normal. She exhibits no distension and no mass. There is no tenderness. There is no guarding.  Neurological: She is alert and oriented to person, place, and time.  Skin: Skin is warm and dry. She is not diaphoretic.  Psychiatric: Her behavior is normal.  Nursing note and vitals reviewed.    ED Treatments / Results  Labs (all labs ordered are listed, but only abnormal results are displayed) Labs Reviewed - No data to display  EKG  EKG Interpretation None       Radiology No results found.  Procedures Procedures (including critical care time)  Medications Ordered in ED Medications - No data to display   Initial Impression / Assessment and Plan / ED Course  I have reviewed the triage vital signs and the nursing notes.  Pertinent labs & imaging results that were  available during my care of the patient were reviewed by me and considered in my medical decision making (see chart for details).     Patient's repeat CT imaging shows no worsening clot burden. The patient will continue her Lovenox as directed. I discussed reasons to seek immediate medical care in the emergency department. The patient has not had any hypoxia throughout her visit and is breathing normally. Patient appears safe for discharge at this time Final Clinical Impressions(s) / ED Diagnoses   Final diagnoses:  Other acute pulmonary embolism without acute cor pulmonale [facility])    New Prescriptions New Prescriptions   No medications on file     Delos Haring 01/10/17 1713    Arby Barrette, MD 01/26/17 1539    Arthor Captain, PA-C 04/05/17 1605    Arby Barrette, MD 04/05/17 [phone]

## 2017-01-05 NOTE — Patient Instructions (Signed)
     IF you received an x-ray today, you will receive an invoice from [facility]. Please contact [facility] at [phone] with questions or concerns regarding your invoice.   IF you received labwork today, you will receive an invoice from [facility]. Please contact [facility] at [phone] with questions or concerns regarding your invoice.   Our billing staff will not be able to assist you with questions regarding bills from these companies.  You will be contacted with the lab results as soon as they are available. The fastest way to get your results is to activate your My Chart account. Instructions are located on the last page of this paperwork. If you have not heard from us regarding the results in 2 weeks, please contact this office.     

## 2017-01-05 NOTE — ED Notes (Signed)
Patient transported to CT 

## 2017-01-05 NOTE — Discharge Instructions (Signed)
Continue to take your lovenox. Return for any of the reasons we discussed.  Get help right away if: You have new or increased pain, swelling, or redness in an arm or leg. You have numbness or tingling in an arm or leg. You have shortness of breath while active or at rest. You have chest pain. You have a rapid or irregular heartbeat. You feel light-headed or dizzy. You cough up blood. You notice blood in your vomit, bowel movement, or urine. You have a fever.

## 2017-01-05 NOTE — ED Notes (Signed)
Waiting for IV team. Pt is a very difficult stick and even informed me she has to call IV team for ultrasound.

## 2017-01-05 NOTE — ED Notes (Signed)
IV team unable to obtain IV access.  Will inform EDP.

## 2017-01-05 NOTE — ED Notes (Signed)
IV team at bedside attempting ultrasound IV.

## 2017-01-05 NOTE — Progress Notes (Signed)
[facility] [address], [place] [address] [phone]  Date:  01/05/2017   Name:  Hayley Webb   DOB:  02-Jan-1986   MRN:  [id number]  PCP:  Patient, No Pcp Per    History of Present Illness:  Hayley Webb is a 31 y.o. female patient who presents to PCP with  Chief Complaint  Patient presents with  . New Patient (Initial Visit)    hospital follow for pulmonary embolism      She was to follow up with a PCP in 1 week, but due to the pain--she came today.  TODAY  Since she went this 4 days ago, she feels like the chest pain is worse at her left upper chest.  Radiates up to her neck, and back down.  7/10 pain.  Pain appears constant, but there are some moments where she will have the pain worsen.  This is aggravated by exertion.  Intensity lessens at rest.  Stabbing character.  There is nausea, sob, dyspnea, and diaphoresis.  No dyspnea.  She does feel overheated that.   She is taking the lovenox, methylprednisolone, and plaquenil.  No baby aspirin as recommended in d/c due to awaiting the result of the lupus anti-coagulant.   She is hydrating since she returned home.  Eating to a heart healthy diet, avoiding salts as recommended at her recent visit.  Taking 3.5 of the lovenox, she reports.   No other anti-coagulation at this time.    PRIOR HOSPITALIZATION:  Patient was seen 6/5 (7 days ago) for chest pain, and shortness of breath.  CTA was positive for a pulmonary embolism.  She was discharged on xarelto.  After worsening pain, 6/8--she was admitted to the hospital where she was initiated with heparin and coumadin.  Just prior to discharge, she was started on lovenox with the plan to transition to oral anti-coagulation with Dr. Pamelia HoitGudena outpatient.  She noted, to have improvement, but by Monday (5 days ago), she developed the worsening chest pain above.   Patient Active Problem List   Diagnosis Date Noted  . Pulmonary embolism (HCC) 12/26/2016  . SOB (shortness of  breath)   . Vaginal delivery--VE assist 10/25/2013  . 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 Obesity-BMI 47 05/09/2013  . Hirsutism 10/02/2012  . STD (female) 01/18/2012  . Lupus   . Asthma   . HSV infection     Past Medical History:  Diagnosis Date  . Anxiety    doing good now  . Asthma   . Headache(784.0)   . HSV infection   . Infection    UTI  . Lupus    dx age 31  . Pulmonary embolism (HCC) 06/4/82011  . 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 MOUTH SURGERY     2 teeth removed- 1 wisdom and 1 in front of it  . TONSILLECTOMY AND ADENOIDECTOMY  1998  . WISDOM TOOTH EXTRACTION      Social History  Substance Use Topics  . Smoking status: Never Smoker  . Smokeless tobacco: Never Used  . Alcohol use No    Family History  Problem Relation Age of Onset  . Diabetes Maternal Aunt   . Cancer Maternal Grandmother 72       COLON CA  . Diabetes Maternal Grandmother   . Hypertension Maternal Grandfather   . Cancer Maternal  Grandfather        prostate  . Asthma Mother   . Asthma Brother   . Cancer Paternal Grandmother        breast  . Lupus Paternal Grandmother   . Anesthesia problems Neg Hx   . Hypotension Neg Hx   . Malignant hyperthermia Neg Hx   . Pseudochol deficiency Neg Hx     Allergies  Allergen Reactions  . Bactrim Anaphylaxis and Hives  . Prednisone Anaphylaxis and Hives    Has been on Dexamethasone without issue.  . Nsaids     Has Lupus, reccommended to avoid NSAIDs  . Other Itching    Shrimp:throat itching "can eat other shellfish"  . Pineapple Itching  . Procardia [Nifedipine] Swelling    Swelling of tongue  . Vicodin [Hydrocodone-Acetaminophen] Hives and Swelling    Can take Percocet without difficulty    Medication list has been reviewed and updated.  Current Outpatient Prescriptions on File  Prior to Visit  Medication Sig Dispense Refill  . albuterol (PROVENTIL HFA;VENTOLIN HFA) 108 (90 Base) MCG/ACT inhaler Inhale 2 puffs into the lungs every 6 (six) hours as needed for wheezing or shortness of breath.    Marland Kitchen albuterol (PROVENTIL) (2.5 MG/3ML) 0.083% nebulizer solution Take 2.5 mg by nebulization every 6 (six) hours as needed for wheezing or shortness of breath.    . enoxaparin (LOVENOX) 150 MG/ML injection Inject 1 mL (150 mg total) into the skin every 12 (twelve) hours. 80 Syringe 1  . Hydroxychloroquine Sulfate (PLAQUENIL PO) Take 200 mg by mouth daily.     . methylPREDNISolone (MEDROL) 2 MG tablet Take 4 mg by mouth daily.     . Multiple Vitamins-Minerals (MULTIVITAMIN ADULT PO) Take 1 tablet by mouth daily.    Marland Kitchen oxyCODONE-acetaminophen (PERCOCET/ROXICET) 5-325 MG tablet Take 1 tablet by mouth every 6 (six) hours as needed for moderate pain. 30 tablet 0  . senna (SENOKOT) 8.6 MG TABS tablet Take 1 tablet (8.6 mg total) by mouth 2 (two) times daily. 120 each 0  . acetaminophen (TYLENOL) 500 MG tablet Take 1,000 mg by mouth every 6 (six) hours as needed.     No current facility-administered medications on file prior to visit.     ROS ROS otherwise unremarkable unless listed above.  Physical Examination: BP 128/80 (BP Location: Left Arm, Patient Position: Sitting, Cuff Size: Large)   Pulse (!) 121   Temp 98.4 F (36.9 C) (Oral)   Resp 16   Ht 5' 8.75" (1.746 m)   Wt (!) 338 lb 12.8 oz (153.7 kg)   LMP 12/11/2016   SpO2 95%   BMI 50.40 kg/m  Ideal Body Weight: Weight in (lb) to have BMI = 25: 167.7  Physical Exam  Constitutional: She is oriented to person, place, and time. She appears well-developed and well-nourished. No distress.  HENT:  Head: Normocephalic and atraumatic.  Right Ear: External ear normal.  Left Ear: External ear normal.  Eyes: Conjunctivae and EOM are normal. Pupils are equal, round, and reactive to light.  Cardiovascular: Regular rhythm.   Tachycardia present.  Exam reveals no gallop and no friction rub.   Pulses:      Carotid pulses are 2+ on the right side, and 2+ on the left side.      Radial pulses are 2+ on the right side, and 2+ on the left side.       Dorsalis pedis pulses are 2+ on the right side, and 2+ on the left side.  Pulmonary/Chest: Effort normal. No accessory muscle usage. No apnea. No respiratory distress. She has no decreased breath sounds. She has no wheezes. She has no rhonchi.  Neurological: She is alert and oriented to person, place, and time.  Skin: She is not diaphoretic.  Psychiatric: She has a normal mood and affect. Her behavior is normal.   CT-scan of the chest IMPRESSION: New pulmonary embolus in a RIGHT upper lobe pulmonary arterial branch.  Tiny persistent embolus in a RIGHT lower lobe pulmonary artery.  Tiny probable pulmonary embolus in LEFT upper lobe, potentially present on previous exam.  Findings called to Harolyn Rutherford PA on 12/29/2016 at 1645 hours.  Electronically Signed   By: Ulyses Southward M.D.   On: 12/29/2016 16:45  O2 remains in wnl with ambulation Pulse remains 121-125bpm  Assessment and Plan: Hayley Webb is a 30 y.o. female who is here today for cc of chest pain, sob, and dizziness. New onset atrial flutter She will need to be monitored at this time, heart rate control.  Possibility of this the pulmonary emboli causing cardiovascular burden.  Transferred by EMS Tachycardia - Plan: POCT CBC, POCT glucose (manual entry), EKG 12-Lead  Other chest pain - Plan: EKG 12-Lead  New onset atrial flutter (HCC)  Other acute pulmonary embolism without acute cor pulmonale (HCC)  Trena Platt, PA-C [facility] 6/15/20183:40 PM

## 2017-01-05 NOTE — ED Triage Notes (Signed)
Per Pt she was at Primary care and the physician saw an irregular heart rhythm and pt stated she was having chest pain. Pt stsated she was just released from hospital on 01/02/17 with DVT in both lungs. Pt was placed on Lovenox inj. 324 of aspirin from EMS and 1 nitro.

## 2017-01-08 ENCOUNTER — Telehealth: Payer: Self-pay | Admitting: Physician Assistant

## 2017-01-08 NOTE — Telephone Encounter (Signed)
Could you add the IV placement, and then I will close this

## 2017-01-08 NOTE — Telephone Encounter (Signed)
Yes, thanks for the reminder

## 2017-01-08 NOTE — Telephone Encounter (Signed)
Thanks

## 2017-01-09 ENCOUNTER — Ambulatory Visit (INDEPENDENT_AMBULATORY_CARE_PROVIDER_SITE_OTHER): Payer: 59 | Admitting: Physician Assistant

## 2017-01-09 ENCOUNTER — Encounter: Payer: Self-pay | Admitting: Physician Assistant

## 2017-01-09 VITALS — BP 123/91 | HR 96 | Temp 98.6°F | Resp 16 | Ht 69.0 in | Wt 338.8 lb

## 2017-01-09 DIAGNOSIS — Z09 Encounter for follow-up examination after completed treatment for conditions other than malignant neoplasm: Secondary | ICD-10-CM

## 2017-01-09 DIAGNOSIS — Z5181 Encounter for therapeutic drug level monitoring: Secondary | ICD-10-CM | POA: Diagnosis not present

## 2017-01-09 DIAGNOSIS — Z7901 Long term (current) use of anticoagulants: Secondary | ICD-10-CM | POA: Diagnosis not present

## 2017-01-09 DIAGNOSIS — I2699 Other pulmonary embolism without acute cor pulmonale: Secondary | ICD-10-CM | POA: Diagnosis not present

## 2017-01-09 NOTE — Patient Instructions (Addendum)
Return in 1 week for recheck of cbc.   Continue your medication, and make sure that you make the appointment with Dr. Pamelia HoitGudena.     IF you received an x-ray today, you will receive an invoice from [facility]. Please contact [facility] at [phone] with questions or concerns regarding your invoice.   IF you received labwork today, you will receive an invoice from [facility]. Please contact [facility] at [phone] with questions or concerns regarding your invoice.   Our billing staff will not be able to assist you with questions regarding bills from these companies.  You will be contacted with the lab results as soon as they are available. The fastest way to get your results is to activate your My Chart account. Instructions are located on the last page of this paperwork. If you have not heard from us regarding the results in 2 weeks, please contact this office.

## 2017-01-09 NOTE — Progress Notes (Signed)
PRIMARY C[facility] AT [facility] [address], [place] [address] [phone]  Date:  01/09/2017   Name:  Hayley Webb   DOB:  26-Feb-1986   MRN:  [id number]  PCP:  Patient, No Pcp Per    History of Present Illness:  Hayley Webb is a 31 y.o. female patient who presents to PCP with  Chief Complaint  Patient presents with  . Follow-up    ED VISIT for elevated HEART RATE     Patient is here today for ED follow up after she came to us as a new patient 4 days ago with tachycardia, chest pain and sob.  There was possible new onset atrial flutter, but due to recent hx of Pulmonary embolism, she required monitoring and heart rate control and possible imaging.   She reports that she feels much better after 2 days.  She reports that she is taking the lovenox.  No sob, or dizziness.  There are infrequent left sided chest pains, that come and go throughout the day, but have no association with any other symptoms.    Patient Active Problem List   Diagnosis Date Noted  . Pulmonary embolism (HCC) 12/26/2016  . SOB (shortness of breath)   . Vaginal delivery--VE assist 10/25/2013  . 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 Obesity-BMI 47 05/09/2013  . Hirsutism 10/02/2012  . STD (female) 01/18/2012  . Lupus   . Asthma   . HSV infection     Past Medical History:  Diagnosis Date  . Anxiety    doing good now  . Asthma   . Headache(784.0)   . HSV infection   . Infection    UTI  . Lupus    dx age 31  . Pulmonary embolism (HCC) 06/4/82011  . 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 MOUTH SURGERY     2 teeth removed- 1 wisdom and 1 in front of it  . TONSILLECTOMY AND ADENOIDECTOMY  1998  . WISDOM TOOTH EXTRACTION      Social History  Substance Use Topics  . Smoking status: Never Smoker  . Smokeless tobacco: Never Used  . Alcohol use No     Family History  Problem Relation Age of Onset  . Diabetes Maternal Aunt   . Cancer Maternal Grandmother 72       COLON CA  . Diabetes Maternal Grandmother   . Hypertension Maternal Grandfather   . Cancer Maternal Grandfather        prostate  . Asthma Mother   . Asthma Brother   . Cancer Paternal Grandmother        breast  . Lupus Paternal Grandmother   . Anesthesia problems Neg Hx   . Hypotension Neg Hx   . Malignant hyperthermia Neg Hx   . Pseudochol deficiency Neg Hx     Allergies  Allergen Reactions  . Bactrim Anaphylaxis and Hives  . Prednisone Anaphylaxis and Hives    Has been on Dexamethasone without issue.  . Nsaids Other (See Comments)    Has Lupus, reccommended to avoid NSAIDs  . Other Itching    Shrimp:throat itching "can eat other shellfish"  . Pineapple Itching  . Procardia [Nifedipine] Swelling    Swelling of tongue  . Vicodin [Hydrocodone-Acetaminophen] Hives and Swelling    Can take Percocet without difficulty    Medication list has been reviewed and updated.  Current Outpatient Prescriptions on File Prior to Visit  Medication Sig Dispense Refill  . acetaminophen (TYLENOL) 500 MG tablet Take 1,000 mg by mouth every 6 (six) hours as needed for headache (pain).     Marland Kitchen albuterol (PROVENTIL HFA;VENTOLIN HFA) 108 (90 Base) MCG/ACT inhaler Inhale 2 puffs into the lungs every 6 (six) hours as needed for wheezing or shortness of breath.    Marland Kitchen albuterol (PROVENTIL) (2.5 MG/3ML) 0.083% nebulizer solution Take 2.5 mg by nebulization every 6 (six) hours as needed for wheezing or shortness of breath.    . enoxaparin (LOVENOX) 150 MG/ML injection Inject 1 mL (150 mg total) into the skin every 12 (twelve) hours. 80 Syringe 1  . hydroxychloroquine (PLAQUENIL) 200 MG tablet Take 200 mg by mouth daily.    . methylPREDNISolone (MEDROL) 4 MG tablet Take 4 mg by mouth daily.    . Multiple Vitamin (MULTIVITAMIN WITH MINERALS) TABS tablet Take 1 tablet by mouth daily.    Marland Kitchen  oxyCODONE-acetaminophen (PERCOCET/ROXICET) 5-325 MG tablet Take 1 tablet by mouth every 6 (six) hours as needed for moderate pain. 30 tablet 0  . senna (SENOKOT) 8.6 MG TABS tablet Take 1 tablet (8.6 mg total) by mouth 2 (two) times daily. 120 each 0   No current facility-administered medications on file prior to visit.     ROS ROS otherwise unremarkable unless listed above.  Physical Examination: BP (!) 123/91 (BP Location: Right Arm, Patient Position: Sitting, Cuff Size: Large)   Pulse 96   Temp 98.6 F (37 C) (Oral)   Resp 16   Ht 5\' 9"  (1.753 m)   Wt (!) 338 lb 12.8 oz (153.7 kg)   LMP 12/11/2016   SpO2 96%   BMI 50.03 kg/m  Ideal Body Weight: Weight in (lb) to have BMI = 25: 168.9  Physical Exam  Constitutional: She is oriented to person, place, and time. She appears well-developed and well-nourished. No distress.  HENT:  Head: Normocephalic and atraumatic.  Right Ear: External ear normal.  Left Ear: External ear normal.  Eyes: Conjunctivae and EOM are normal. Pupils are equal, round, and reactive to light.  Cardiovascular: Normal rate, regular rhythm and intact distal pulses.  Exam reveals no friction rub.   No murmur heard. Pulmonary/Chest: Effort normal and breath sounds normal. No accessory muscle usage. No respiratory distress. She has no decreased breath sounds. She has no wheezes. She has no rhonchi.  Neurological: She is alert and oriented to person, place, and time.  Skin: Skin is warm and dry. She is not diaphoretic.  Psychiatric: She has a normal mood and affect. Her behavior is normal.     Assessment and Plan: Hayley Webb is a 31 y.o. female who is here today for follow up visit.  She is to return to [place] in 2 weeks. We will recheck her cbc in 1 week.  Continue medication as prescribed.  She may turn in fmla/disability forms which can be completed. Visit for monitoring Lovenox therapy - Plan: CBC  Trena Platt, PA-C [facility] 6/19/20181:46 PM

## 2017-01-12 ENCOUNTER — Emergency Department (HOSPITAL_COMMUNITY): Payer: 59

## 2017-01-12 ENCOUNTER — Encounter (HOSPITAL_COMMUNITY): Payer: Self-pay | Admitting: *Deleted

## 2017-01-12 ENCOUNTER — Emergency Department (HOSPITAL_COMMUNITY)
Admission: EM | Admit: 2017-01-12 | Discharge: 2017-01-12 | Disposition: A | Discharge status: Home / Self Care | Payer: 59 | Attending: Emergency Medicine | Admitting: Emergency Medicine

## 2017-01-12 DIAGNOSIS — I2699 Other pulmonary embolism without acute cor pulmonale: Secondary | ICD-10-CM | POA: Diagnosis not present

## 2017-01-12 DIAGNOSIS — Z79899 Other long term (current) drug therapy: Secondary | ICD-10-CM | POA: Diagnosis not present

## 2017-01-12 DIAGNOSIS — R0789 Other chest pain: Secondary | ICD-10-CM | POA: Diagnosis present

## 2017-01-12 DIAGNOSIS — J45909 Unspecified asthma, uncomplicated: Secondary | ICD-10-CM | POA: Insufficient documentation

## 2017-01-12 DIAGNOSIS — R0602 Shortness of breath: Secondary | ICD-10-CM | POA: Diagnosis not present

## 2017-01-12 DIAGNOSIS — R079 Chest pain, unspecified: Secondary | ICD-10-CM

## 2017-01-12 LAB — POCT I-STAT TROPONIN I: TROPONIN I, POC: 0.01 ng/mL (ref 0.00–0.08)

## 2017-01-12 LAB — BASIC METABOLIC PANEL
Anion gap: 7 (ref 5–15)
BUN: 9 mg/dL (ref 6–20)
CALCIUM: 9.4 mg/dL (ref 8.9–10.3)
CHLORIDE: 104 mmol/L (ref 101–111)
CO2: 24 mmol/L (ref 22–32)
CREATININE: 0.72 mg/dL (ref 0.44–1.00)
GFR calc Af Amer: >60 mL/min (ref >60)
GFR calc non Af Amer: >60 mL/min (ref >60)
Glucose, Bld: 90 mg/dL (ref 65–99)
Potassium: 3.8 mmol/L (ref 3.5–5.1)
SODIUM: 135 mmol/L (ref 135–145)

## 2017-01-12 LAB — CBC
HCT: 36.4 % (ref 36.0–46.0)
Hemoglobin: 12 g/dL (ref 12.0–15.0)
MCH: 27.2 pg (ref 26.0–34.0)
MCHC: 33 g/dL (ref 30.0–36.0)
MCV: 82.5 fL (ref 78.0–100.0)
PLATELETS: 310 10*3/uL (ref 150–400)
RBC: 4.41 MIL/uL (ref 3.87–5.11)
RDW: 14.4 % (ref 11.5–15.5)
WBC: 5.3 10*3/uL (ref 4.0–10.5)

## 2017-01-12 LAB — PROTIME-INR
INR: 1.04
Prothrombin Time: 13.7 seconds (ref 11.4–15.2)

## 2017-01-12 LAB — BRAIN NATRIURETIC PEPTIDE: B Natriuretic Peptide: 11.1 pg/mL (ref 0.0–100.0)

## 2017-01-12 MED ORDER — ACETAMINOPHEN 325 MG PO TABS
650.0000 mg | ORAL_TABLET | Freq: Once | ORAL | Status: AC
  Administered 2017-01-12: 650 mg via ORAL
  Filled 2017-01-12: qty 2

## 2017-01-12 NOTE — Discharge Instructions (Signed)
Please continue taking your Lovenox as directed. Follow up with your PCP in the next week. .If you develop worsening or new concerning symptoms you can return to the emergency department for re-evaluation.

## 2017-01-12 NOTE — ED Provider Notes (Signed)
[facility]-EMERGENCY DEPT Provider Note   CSN: [id number] Arrival date & time: 01/12/17  1305     History   Chief Complaint Chief Complaint  Patient presents with  . Chest Pain    HPI Hayley Webb is a 31 y.o. female who presents to [facility] long emergency department for chest pain since Wednesday. The was diagnosed with a PE on 12/26/16 and placed on Xarelto. The patient returned on 12/29/2016 for worsening shortness of breath and was diagnosed with a worsening clot burden by CT. She was switched from Xarelto to Lovenox BID. During follow-up with her PCP on 01/05/17 she was told to return to the emergency department for ongoing and worsening shortness of breath. She received another CT which showed no worsening clot burden. During all 3 visits to the emergency department she had negative venous duplexes of the lower extremities.  The patient is presenting today for left-sided chest pain. The patient states that when she was walking to work on Wednesday morning she started experiencing a sharp left-sided chest pain that radiated to her left neck and left shoulder with associated lightheadedness, nausea, shortness of breath. This was relieved with rest after 15 minutes. The pain returned every hour for 15 minutes while the patient was sitting at her desk. After 5 PM on Wednesday the pain has remained constant and unchanged. The patient states that she is especially short of breath when lying down, now requiring 4-5 pillows at night in order to sleep. She also states that she becomes more short of breath during exertion. The patient has taken 1 Tylenol for this without relief. She does admit to missing 1 dose of Lovenox Tuesday morning. She states this is a different kind of pain than she felt when she was first diagnosed with a PE. She is complaining of lower calf tenderness and swelling bilaterally. She has no cardiac family history or personal history of cardiac disease. She had an echo done on 01/01/2017 with  a ejection fraction of 60-65% and no wall motion abnormalities. She reports no new physical activity changes.   HPI  Past Medical History:  Diagnosis Date  . Anxiety    doing good now  . Asthma   . Headache(784.0)   . HSV infection   . Infection    UTI  . Lupus    dx age 89  . Pulmonary embolism (HCC) 06/4/82011  . 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 Pulmonary embolism (HCC) 12/26/2016  . SOB (shortness of breath)   . Vaginal delivery--VE assist 10/25/2013  . 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 Obesity-BMI 47 05/09/2013  . Hirsutism 10/02/2012  . STD (female) 01/18/2012  . Lupus   . Asthma   . HSV infection     Past Surgical History:  Procedure Laterality Date  . MOUTH SURGERY     2 teeth removed- 1 wisdom and 1 in front of it  . TONSILLECTOMY AND ADENOIDECTOMY  1998  . WISDOM TOOTH EXTRACTION      OB History    Gravida Para Term Preterm AB Living   2 1 1   1 1    SAB TAB Ectopic Multiple Live Births   1       1       Home Medications    Prior to Admission medications   Medication Sig Start Date End Date  Taking? Authorizing Provider  acetaminophen (TYLENOL) 500 MG tablet Take 1,000 mg by mouth every 6 (six) hours as needed for headache (pain).    Yes [provider]  albuterol (PROVENTIL HFA;VENTOLIN HFA) 108 (90 Base) MCG/ACT inhaler Inhale 2 puffs into the lungs every 6 (six) hours as needed for wheezing or shortness of breath.   Yes [provider]  albuterol (PROVENTIL) (2.5 MG/3ML) 0.083% nebulizer solution Take 2.5 mg by nebulization every 6 (six) hours as needed for wheezing or shortness of breath.   Yes [provider]  enoxaparin (LOVENOX) 150 MG/ML injection Inject 1 mL (150 mg total) into the skin every 12 (twelve) hours. 01/02/17  Yes Regalado, Belkys A, MD    hydroxychloroquine (PLAQUENIL) 200 MG tablet Take 200 mg by mouth daily.   Yes [provider]  methylPREDNISolone (MEDROL) 4 MG tablet Take 4 mg by mouth daily.   Yes [provider]  Multiple Vitamin (MULTIVITAMIN WITH MINERALS) TABS tablet Take 1 tablet by mouth daily.   Yes [provider]  oxyCODONE-acetaminophen (PERCOCET/ROXICET) 5-325 MG tablet Take 1 tablet by mouth every 6 (six) hours as needed for moderate pain. 01/02/17  Yes Regalado, Belkys A, MD  senna (SENOKOT) 8.6 MG TABS tablet Take 1 tablet (8.6 mg total) by mouth 2 (two) times daily. 01/02/17  Yes Regalado, Prentiss Bells, MD    Family History Family History  Problem Relation Age of Onset  . Diabetes Maternal Aunt   . Cancer Maternal Grandmother 72       COLON CA  . Diabetes Maternal Grandmother   . Hypertension Maternal Grandfather   . Cancer Maternal Grandfather        prostate  . Asthma Mother   . Asthma Brother   . Cancer Paternal Grandmother        breast  . Lupus Paternal Grandmother   . Anesthesia problems Neg Hx   . Hypotension Neg Hx   . Malignant hyperthermia Neg Hx   . Pseudochol deficiency Neg Hx     Social History Social History  Substance Use Topics  . Smoking status: Never Smoker  . Smokeless tobacco: Never Used  . Alcohol use No     Allergies   Bactrim; Prednisone; Nsaids; Other; Pineapple; Procardia [nifedipine]; and Vicodin [hydrocodone-acetaminophen]   Review of Systems Review of Systems  All other systems reviewed and are negative.    Physical Exam Updated Vital Signs BP (!) 128/91   Pulse 82   Temp 98.1 F (36.7 C) (Oral)   Resp 10   LMP 12/11/2016   SpO2 100%   Physical Exam  Constitutional: She appears well-developed and well-nourished.  Nontoxic appearing overweight female  HENT:  Head: Normocephalic and atraumatic.  Right Ear: External ear normal.  Left Ear: External ear normal.  Nose: Nose normal.  Mouth/Throat: Oropharynx is clear and  moist.  Eyes: Conjunctivae are normal. Pupils are equal, round, and reactive to light. Right eye exhibits no discharge. Left eye exhibits no discharge. No scleral icterus.  Neck: Neck supple. No JVD present.  Cardiovascular: Regular rhythm and intact distal pulses.  Tachycardia present.   Murmur (mild systolic) heard. Pulses:      Dorsalis pedis pulses are 2+ on the right side, and 2+ on the left side.       Posterior tibial pulses are 2+ on the right side, and 2+ on the left side.  No lower extremity swelling or edema. Mild calf tenderness bilaterally. No cords appreciated. DP and PT pulses 2+  and equal bilaterally.   Pulmonary/Chest: Effort normal and breath sounds normal. She exhibits tenderness.    Abdominal: Soft. Bowel sounds are normal. There is no tenderness. There is no rebound and no guarding.  Bruising noted at sites of injection for Lovenox.  Musculoskeletal: She exhibits no edema.  Lymphadenopathy:    She has no cervical adenopathy.  Neurological: She is alert.  Skin: Skin is warm and dry. No rash noted. She is not diaphoretic.  Psychiatric: She has a normal mood and affect.  Nursing note and vitals reviewed.    ED Treatments / Results  Labs (all labs ordered are listed, but only abnormal results are displayed) Labs Reviewed  BASIC METABOLIC PANEL  CBC  PROTIME-INR  BRAIN NATRIURETIC PEPTIDE  I-STAT TROPOININ, ED  POCT I-STAT TROPONIN I    EKG  EKG Interpretation  Date/Time:  Friday January 12 2017 13:18:03 EDT Ventricular Rate:  102 PR Interval:    QRS Duration: 88 QT Interval:  361 QTC Calculation: 471 R Axis:   7 Text Interpretation:  Sinus tachycardia Borderline T abnormalities, anterior leads Baseline wander in lead(s) V3 V6 since last tracing no significant change Confirmed by Rolan Bucco [phone]) on 01/12/2017 7:48:42 PM       Radiology Dg Chest 2 View  Result Date: 01/12/2017 CLINICAL DATA:  Three day history of chest pain. EXAM: CHEST  2 VIEW  COMPARISON:  01/01/2017 FINDINGS: The heart size and mediastinal contours are within normal limits. Both lungs are clear. The visualized skeletal structures are unremarkable. IMPRESSION: No active cardiopulmonary disease. Electronically Signed   By: Kennith Center M.D.   On: 01/12/2017 14:02    Procedures Procedures (including critical care time)  Medications Ordered in ED Medications  acetaminophen (TYLENOL) tablet 650 mg (650 mg Oral Given 01/12/17 2020)     Initial Impression / Assessment and Plan / ED Course  I have reviewed the triage vital signs and the nursing notes.  Pertinent labs & imaging results that were available during my care of the patient were reviewed by me and considered in my medical decision making (see chart for details).     31 y.o. female who presents to [facility] long emergency department for chest pain since Wednesday. The was diagnosed with a PE on 12/26/16 and placed on Xarelto. The patient returned on 12/29/2016 for worsening shortness of breath and was diagnosed with a worsening clot burden by CT. She was switched from Xarelto to Lovenox BID. Was seen in the ED again on 01/05/17 for worsening shortness of breath. She received another CT which showed no worsening clot burden. During all 3 visits to the emergency department she had negative venous duplexes of the lower extremities. She also had an echo done on 01/01/2017 with a ejection fraction of 60-65% and no wall motion abnormalities.  Now presenting with L sided sharp chest pain with radiation to the neck and left shoulder with associated nasuea, sob, orthopnea, doe, and dizziness. She is non-toxic on presentation. Afebrile with normal HR and non-labored breathing. On exam she is tender over the left chest.   Patient ambulated with pulse oximetry. Sp02 stayed at 100 the entire time and heart rate below 100. Patient reports being dizzy but no shortness of breath during the event.   She is requesting tylenol for pain.  Will order.   Tn negative. BMP unremarkable. INR wnl. BNP unremarkable.   CXR with no active cardiopulmonary disease. EKG with mild tachycardia. No ST changes. No significant changes since last EKG.  Tachycardia resolved on repeat vitals.   Discussed with patient about repeat CT. As the patient is not hypoxic on ambulation do not feel the need to repeat at this time. Offered to patient if she would like repeat CT and she denied. Patient explained and understands risks.  This case was discussed with Dr. Fredderick PhenixBelfi who is in agreenace with the plan.   Patient to be discharged home with follow up to PCP. Return precautions discussed. Patient in agreeance. All questions answered. Patient stable and non-ill appearing at the time of discharge. All instructions personally discussed with patient.   Final Clinical Impressions(s) / ED Diagnoses   Final diagnoses:  Chest pain, unspecified type  Other acute pulmonary embolism without acute cor pulmonale [facility](HCC)    New Prescriptions New Prescriptions   No medications on file     Princella PellegriniMaczis, Tashianna Broome M, PA-C 01/12/17 2103    Rolan BuccoBelfi, Melanie, MD 01/12/17 2255

## 2017-01-12 NOTE — ED Triage Notes (Signed)
Pt reports being dx with a PE on 6/5 and put Lenovox.  Pt has been having intermittent chest pain since Wednesday.  Pt has been constant since last night.  Pt reports SOB and nausea.  Pain radiates to the neck but no diaphoresis noted.

## 2017-01-15 ENCOUNTER — Telehealth: Payer: Self-pay | Admitting: Physician Assistant

## 2017-01-15 NOTE — Telephone Encounter (Signed)
Patient needs disability forms completed by Trena PlattStephanie English from her OV on 01/09/17 for her pulmonary embolism. I have completed what I could from the notes I was not sure of her time off of work since it doesn't look like we took her out but the ER did. Let me know if you can not fill the forms out and I will call the patient.  I will place the forms in your box on 01/15/17 please return to the FMLA/Disability box at the [employer] checkout desk within 5-7 business days thank you!

## 2017-01-15 NOTE — Telephone Encounter (Signed)
Gurney MaxinMike Mani is covering my box. I am not in the office for the week

## 2017-01-16 ENCOUNTER — Encounter (HOSPITAL_COMMUNITY): Payer: Self-pay

## 2017-01-16 ENCOUNTER — Emergency Department (HOSPITAL_COMMUNITY)
Admission: EM | Admit: 2017-01-16 | Discharge: 2017-01-16 | Disposition: A | Discharge status: Home / Self Care | Payer: 59 | Attending: Emergency Medicine | Admitting: Emergency Medicine

## 2017-01-16 ENCOUNTER — Ambulatory Visit (INDEPENDENT_AMBULATORY_CARE_PROVIDER_SITE_OTHER): Payer: 59 | Admitting: Urgent Care

## 2017-01-16 ENCOUNTER — Ambulatory Visit (HOSPITAL_COMMUNITY): Admission: RE | Admit: 2017-01-16 | Payer: 59 | Source: Ambulatory Visit

## 2017-01-16 ENCOUNTER — Other Ambulatory Visit: Payer: Self-pay

## 2017-01-16 ENCOUNTER — Emergency Department (HOSPITAL_COMMUNITY): Payer: 59

## 2017-01-16 ENCOUNTER — Encounter: Payer: Self-pay | Admitting: Urgent Care

## 2017-01-16 VITALS — BP 119/83 | HR 93 | Temp 98.3°F | Resp 16 | Ht 68.75 in | Wt 340.4 lb

## 2017-01-16 DIAGNOSIS — J45909 Unspecified asthma, uncomplicated: Secondary | ICD-10-CM | POA: Insufficient documentation

## 2017-01-16 DIAGNOSIS — Z79899 Other long term (current) drug therapy: Secondary | ICD-10-CM | POA: Insufficient documentation

## 2017-01-16 DIAGNOSIS — R0789 Other chest pain: Secondary | ICD-10-CM | POA: Diagnosis not present

## 2017-01-16 DIAGNOSIS — Z86711 Personal history of pulmonary embolism: Secondary | ICD-10-CM

## 2017-01-16 DIAGNOSIS — Y939 Activity, unspecified: Secondary | ICD-10-CM | POA: Diagnosis not present

## 2017-01-16 DIAGNOSIS — Y929 Unspecified place or not applicable: Secondary | ICD-10-CM | POA: Insufficient documentation

## 2017-01-16 DIAGNOSIS — S301XXA Contusion of abdominal wall, initial encounter: Secondary | ICD-10-CM | POA: Diagnosis not present

## 2017-01-16 DIAGNOSIS — Y999 Unspecified external cause status: Secondary | ICD-10-CM | POA: Diagnosis not present

## 2017-01-16 DIAGNOSIS — Z7901 Long term (current) use of anticoagulants: Secondary | ICD-10-CM

## 2017-01-16 DIAGNOSIS — R1032 Left lower quadrant pain: Secondary | ICD-10-CM | POA: Diagnosis not present

## 2017-01-16 DIAGNOSIS — Z09 Encounter for follow-up examination after completed treatment for conditions other than malignant neoplasm: Secondary | ICD-10-CM | POA: Diagnosis not present

## 2017-01-16 DIAGNOSIS — X58XXXA Exposure to other specified factors, initial encounter: Secondary | ICD-10-CM | POA: Insufficient documentation

## 2017-01-16 DIAGNOSIS — I2699 Other pulmonary embolism without acute cor pulmonale: Secondary | ICD-10-CM

## 2017-01-16 DIAGNOSIS — R079 Chest pain, unspecified: Secondary | ICD-10-CM | POA: Diagnosis not present

## 2017-01-16 LAB — CBC
HCT: 38.3 % (ref 36.0–46.0)
Hemoglobin: 12.4 g/dL (ref 12.0–15.0)
MCH: 27.1 pg (ref 26.0–34.0)
MCHC: 32.4 g/dL (ref 30.0–36.0)
MCV: 83.8 fL (ref 78.0–100.0)
PLATELETS: 283 10*3/uL (ref 150–400)
RBC: 4.57 MIL/uL (ref 3.87–5.11)
RDW: 14.5 % (ref 11.5–15.5)
WBC: 6.3 10*3/uL (ref 4.0–10.5)

## 2017-01-16 LAB — BASIC METABOLIC PANEL
Anion gap: 8 (ref 5–15)
BUN: 9 mg/dL (ref 6–20)
CALCIUM: 9.3 mg/dL (ref 8.9–10.3)
CHLORIDE: 106 mmol/L (ref 101–111)
CO2: 24 mmol/L (ref 22–32)
CREATININE: 0.68 mg/dL (ref 0.44–1.00)
GFR calc non Af Amer: >60 mL/min (ref >60)
Glucose, Bld: 80 mg/dL (ref 65–99)
Potassium: 3.6 mmol/L (ref 3.5–5.1)
SODIUM: 138 mmol/L (ref 135–145)

## 2017-01-16 LAB — POCT CBC
GRANULOCYTE PERCENT: 48.1 % (ref 37–80)
HCT, POC: 34.4 % — AB (ref 37.7–47.9)
HEMOGLOBIN: 11.8 g/dL — AB (ref 12.2–16.2)
Lymph, poc: 2.6 (ref 0.6–3.4)
MCH: 27.5 pg (ref 27–31.2)
MCHC: 34.3 g/dL (ref 31.8–35.4)
MCV: 80.3 fL (ref 80–97)
MID (CBC): 0.3 (ref 0–0.9)
MPV: 7.3 fL (ref 0–99.8)
PLATELET COUNT, POC: 299 10*3/uL (ref 142–424)
POC Granulocyte: 2.6 (ref 2–6.9)
POC LYMPH PERCENT: 46.7 %L (ref 10–50)
POC MID %: 5.2 % (ref 0–12)
RBC: 4.29 M/uL (ref 4.04–5.48)
RDW, POC: 14.7 %
WBC: 5.5 10*3/uL (ref 4.6–10.2)

## 2017-01-16 LAB — I-STAT TROPONIN, ED: TROPONIN I, POC: 0.01 ng/mL (ref 0.00–0.08)

## 2017-01-16 LAB — POCT URINE PREGNANCY: Preg Test, Ur: NEGATIVE

## 2017-01-16 NOTE — Progress Notes (Signed)
MRN: [id number] DOB: [birth year]  Subjective:   Hayley Webb is a 31 y.o. female presenting for follow up on PE. Today, reports that she is taking lovenox consistently. Has had a 1 week history of worsening knot over her left lower abdomen. Area is tender, has bruising. Patient injects lovenox into her flank sides alternating between the two areas in the morning and at night. She has ongoing dizziness since she was first diagnosed with the PE. Still also feels shob, chest pain which is unchanged from the time that she was in the hospital. Denies trauma, falls, fever, calf . Denies smoking cigarettes. Patient was last seen in the ER as recommended by PA-English for persistent chest pain, shob. She has had extensive work up including an echo which was normal, negative troponins, negative lower leg ultrasound. Currently patient has f/u with Dr. Pamelia HoitGudena scheduled for 01/22/2017.  Hayley Webb has a current medication list which includes the following prescription(s): acetaminophen, albuterol, albuterol, enoxaparin, hydroxychloroquine, methylprednisolone, multivitamin with minerals, oxycodone-acetaminophen, and senna. Also is allergic to bactrim; prednisone; nsaids; other; pineapple; procardia [nifedipine]; and vicodin [hydrocodone-acetaminophen]. Hayley Webb  has a past medical history of Anxiety; Asthma; Headache(784.0); HSV infection; Infection; Lupus; Pulmonary embolism (HCC) (06/4/82011); and STD (sexually transmitted disease) (02/2009). Also  has a past surgical history that includes Tonsillectomy and adenoidectomy (1998); Mouth surgery; and Wisdom tooth extraction.  Objective:   Vitals: BP 119/83 (BP Location: Right Arm, Patient Position: Sitting, Cuff Size: Large)   Pulse 93   Temp 98.3 F (36.8 C) (Oral)   Resp 16   Ht 5' 8.75" (1.746 m)   Wt (!) 340 lb 6.4 oz (154.4 kg)   LMP 12/11/2016   SpO2 100%   BMI 50.63 kg/m   Physical Exam  Constitutional: She is oriented to person, place, and time. She  appears well-developed and well-nourished.  Cardiovascular: Normal rate, regular rhythm and intact distal pulses.  Exam reveals no gallop and no friction rub.   No murmur heard. Pulmonary/Chest: No respiratory distress. She has no wheezes. She has no rales.  Abdominal: Soft. Bowel sounds are normal. She exhibits no distension and no mass. There is tenderness. There is no guarding.    Neurological: She is alert and oriented to person, place, and time.   Results for orders placed or performed in visit on 01/16/17 (from the past 24 hour(s))  POCT CBC     Status: Abnormal   Collection Time: 01/16/17  3:42 PM  Result Value Ref Range   WBC 5.5 4.6 - 10.2 K/uL   Lymph, poc 2.6 0.6 - 3.4   POC LYMPH PERCENT 46.7 10 - 50 %L   MID (cbc) 0.3 0 - 0.9   POC MID % 5.2 0 - 12 %M   POC Granulocyte 2.6 2 - 6.9   Granulocyte percent 48.1 37 - 80 %G   RBC 4.29 4.04 - 5.48 M/uL   Hemoglobin 11.8 (A) 12.2 - 16.2 g/dL   HCT, POC 72.534.4 (A) 36.637.7 - 47.9 %   MCV 80.3 80 - 97 fL   MCH, POC 27.5 27 - 31.2 pg   MCHC 34.3 31.8 - 35.4 g/dL   RDW, POC 44.014.7 %   Platelet Count, POC 299 142 - 424 K/uL   MPV 7.3 0 - 99.8 fL   Assessment and Plan :   This case was precepted with Dr. Creta LevinStallings.   1. Abdominal pain, left lower quadrant 2. Other chest pain 3. History of pulmonary embolism 4. Follow up - Will pursue  STAT CT abdomen/pelvis to rule out hematoma versus other acute process. Patient is to keep appointment with Dr. Pamelia Hoit.   Wallis Bamberg, PA-C [facility] [phone] 01/16/2017 3:08 PM

## 2017-01-16 NOTE — ED Provider Notes (Signed)
[facility]Y DEPT Provider Note   CSN: [id number] Arrival date & time: 01/16/17  1701     History   Chief Complaint Chief Complaint  Patient presents with  . Medication Reaction  . Chest Pain    HPI Hayley Webb is a 31 y.o. female.  The history is provided by the patient and medical records. No language interpreter was used.  Abdominal Pain   This is a new problem. The current episode started more than 2 days ago. The problem occurs constantly. The problem has not changed since onset.Associated with: recent lovenox use. The pain is located in the LLQ. The pain is moderate. Pertinent negatives include fever, vomiting, dysuria, hematuria and arthralgias. Associated symptoms comments: LLQ abdominal wall hematoma. Nothing aggravates the symptoms. Nothing relieves the symptoms.    Past Medical History:  Diagnosis Date  . Anxiety    doing good now  . Asthma   . Headache(784.0)   . HSV infection   . Infection    UTI  . Lupus    dx age 43  . Pulmonary embolism (HCC) 06/4/82011  . 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 Pulmonary embolism (HCC) 12/26/2016  . SOB (shortness of breath)   . Vaginal delivery--VE assist 10/25/2013  . 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 Obesity-BMI 47 05/09/2013  . Hirsutism 10/02/2012  . STD (female) 01/18/2012  . Lupus   . Asthma   . HSV infection     Past Surgical History:  Procedure Laterality Date  . MOUTH SURGERY     2 teeth removed- 1 wisdom and 1 in front of it  . TONSILLECTOMY AND ADENOIDECTOMY  1998  . WISDOM TOOTH EXTRACTION      OB History    Gravida Para Term Preterm AB Living   2 1 1   1 1    SAB TAB Ectopic Multiple Live Births   1       1       Home Medications    Prior to Admission medications   Medication Sig Start Date End Date Taking? Authorizing Provider    acetaminophen (TYLENOL) 500 MG tablet Take 1,000 mg by mouth every 6 (six) hours as needed for headache (pain).    Yes [provider]  albuterol (PROVENTIL HFA;VENTOLIN HFA) 108 (90 Base) MCG/ACT inhaler Inhale 2 puffs into the lungs every 6 (six) hours as needed for wheezing or shortness of breath.   Yes [provider]  albuterol (PROVENTIL) (2.5 MG/3ML) 0.083% nebulizer solution Take 2.5 mg by nebulization every 6 (six) hours as needed for wheezing or shortness of breath.   Yes [provider]  enoxaparin (LOVENOX) 150 MG/ML injection Inject 1 mL (150 mg total) into the skin every 12 (twelve) hours. 01/02/17  Yes Regalado, Belkys A, MD  hydroxychloroquine (PLAQUENIL) 200 MG tablet Take 200 mg by mouth daily.   Yes [provider]  methylPREDNISolone (MEDROL) 4 MG tablet Take 4 mg by mouth daily.   Yes [provider]  Multiple Vitamin (MULTIVITAMIN WITH MINERALS) TABS tablet Take 1 tablet by mouth daily.   Yes [provider]  oxyCODONE-acetaminophen (PERCOCET/ROXICET) 5-325 MG tablet Take 1 tablet by mouth every 6 (six) hours as needed for moderate pain. 01/02/17  Yes Regalado, Belkys A, MD  senna (SENOKOT) 8.6 MG TABS tablet Take 1 tablet (8.6 mg total) by mouth  2 (two) times daily. 01/02/17  Yes Regalado, Prentiss BellsBelkys A, MD    Family History Family History  Problem Relation Age of Onset  . Diabetes Maternal Aunt   . Cancer Maternal Grandmother 72       COLON CA  . Diabetes Maternal Grandmother   . Hypertension Maternal Grandfather   . Cancer Maternal Grandfather        prostate  . Asthma Mother   . Asthma Brother   . Cancer Paternal Grandmother        breast  . Lupus Paternal Grandmother   . Anesthesia problems Neg Hx   . Hypotension Neg Hx   . Malignant hyperthermia Neg Hx   . Pseudochol deficiency Neg Hx     Social History Social History  Substance Use Topics  . Smoking status: Never Smoker  . Smokeless tobacco: Never Used   . Alcohol use No     Allergies   Bactrim; Prednisone; Nsaids; Other; Pineapple; Procardia [nifedipine]; and Vicodin [hydrocodone-acetaminophen]   Review of Systems Review of Systems  Constitutional: Negative for chills and fever.  HENT: Negative for ear pain and sore throat.   Eyes: Negative for pain and visual disturbance.  Respiratory: Positive for shortness of breath. Negative for cough.   Cardiovascular: Positive for chest pain. Negative for palpitations.  Gastrointestinal: Positive for abdominal pain. Negative for vomiting.  Genitourinary: Negative for dysuria and hematuria.  Musculoskeletal: Negative for arthralgias and back pain.  Skin: Positive for rash. Negative for color change.  Neurological: Negative for seizures and syncope.  All other systems reviewed and are negative.    Physical Exam Updated Vital Signs BP (!) 142/94   Pulse 86   Temp 97.9 F (36.6 C) (Oral)   Resp 19   LMP 01/16/2017 (Exact Date)   SpO2 98%   Physical Exam  Constitutional: She appears well-developed and well-nourished.  HENT:  Head: Normocephalic and atraumatic.  Eyes: Conjunctivae are normal.  Neck: Neck supple.  Cardiovascular: Normal rate and regular rhythm.   No murmur heard. Pulmonary/Chest: Effort normal and breath sounds normal. No respiratory distress. She has no wheezes. She has no rales.  Abdominal: Soft. She exhibits no distension. There is no tenderness. There is no guarding.  Bruising over LLQ abdomen. Lovenox injection site more posterior L flank  Musculoskeletal: She exhibits no edema.  Neurological: She is alert. No cranial nerve deficit. Coordination normal.  5/5 motor strength and intact sensation in all extremities. Finger-to-nose intact bilaterally  Skin: Skin is warm and dry.  Nursing note and vitals reviewed.    ED Treatments / Results  Labs (all labs ordered are listed, but only abnormal results are displayed) Labs Reviewed  BASIC METABOLIC PANEL  CBC   I-STAT TROPOININ, ED    EKG  EKG Interpretation None       Radiology Dg Chest 2 View  Result Date: 01/16/2017 CLINICAL DATA:  Acute onset of right-sided chest pain yesterday. Patient diagnosed with bilateral pulmonary emboli on 12/29/2016. EXAM: CHEST  2 VIEW COMPARISON:  01/12/2017, 01/01/2017 and earlier, including CTA chest 01/05/2017, 12/29/2016, 12/26/2016. FINDINGS: Cardiomediastinal silhouette unremarkable, unchanged. Lungs clear. Bronchovascular markings normal. Pulmonary vascularity normal. No visible pleural effusions. No pneumothorax. Visualized bony thorax intact. IMPRESSION: Normal examination. Electronically Signed   By: Hulan Saashomas  Lawrence M.D.   On: 01/16/2017 17:56    Procedures Procedures (including critical care time)  Medications Ordered in ED Medications - No data to display   Initial Impression / Assessment and Plan / ED Course  I  have reviewed the triage vital signs and the nursing notes.  Pertinent labs & imaging results that were available during my care of the patient were reviewed by me and considered in my medical decision making (see chart for details).      31 year old female history of PE on Lovenox, obesity, and asthma who presents from PCP for concern of LLQ abdominal wall hematoma.  Patient was diagnosed with acute PE on 12/25/16.  She was admitted and started on Xarelto.  She subsequently developed worsening shortness breath on 12/29/16 and was diagnosed with new PE.  She was reportedly compliant with Xarelto.  On discharge, she was started on Lovenox shots, which she has been administering for the past several weeks in the left flank region.  Over the past week she has noted an increasing bruise on her LLQ abdomen, more anterior from location of her Lovenox shots.  Her PCP evaluated her today and requested emergent CT abdomen/pelvis.  She was sent to ED for further testing.  She endorses continued shortness of breath and chest pain over past several weeks.   She has undergone 3 CTA chest over past month.  Afebrile, VSS.  Lungs clear to auscultation bilaterally.  Abdomen soft benign throughout.  Moderate bruise noted of LLQ abdomen.  CBC showing Hgb at baseline.  CMP unremarkable.  Spot troponin 0.01.  CXR showing no acute cardiopulmonary normality.  Bedside ultrasound showing no superficial or deep space fluid collection of LLQ abdomen.  Likely abdominal wall bruising from Lovenox.  Bedside ultrasound also showing no evidence of right heart strain including septal deviation or enlarged right heart.  Given patient's young age, extensive recent CT imaging and low concern for acute intra-abdominal process, feel risks of CT imaging outweigh benefits.  Encouraged continued compliance with Lovenox.  She is stable for continued outpatient follow-up.  Return precautions provided for worsening symptoms. Pt will f/u with PCP at first availability. Pt verbalized agreement with plan.  Pt care d/w Dr. Adela Lank  Final Clinical Impressions(s) / ED Diagnoses   Final diagnoses:  Abdominal wall hematoma, initial encounter    New Prescriptions Discharge Medication List as of 01/16/2017  9:15 PM       Hebert Soho, MD 01/17/17 1653    Melene Plan, DO 01/17/17 1739

## 2017-01-16 NOTE — Patient Instructions (Signed)
     IF you received an x-ray today, you will receive an invoice from [facility]. Please contact [facility] at [phone] with questions or concerns regarding your invoice.   IF you received labwork today, you will receive an invoice from [facility]. Please contact [facility] at [phone] with questions or concerns regarding your invoice.   Our billing staff will not be able to assist you with questions regarding bills from these companies.  You will be contacted with the lab results as soon as they are available. The fastest way to get your results is to activate your My Chart account. Instructions are located on the last page of this paperwork. If you have not heard from us regarding the results in 2 weeks, please contact this office.     

## 2017-01-16 NOTE — ED Triage Notes (Signed)
Pt was dx with a PE eralier this month. Currently taking lovenox and has knot and bruising on her abdomen. Pt also reports persistent chest pain that has now moved into the right side of her chest. No acute distress noted.

## 2017-01-16 NOTE — Discharge Instructions (Signed)
Please apply ice to painful areas. Please followup closely with primary doctor. Please return for worsening symptoms.

## 2017-01-16 NOTE — ED Notes (Signed)
MD assessed and discharged pt before RN assessment.  

## 2017-01-17 ENCOUNTER — Telehealth: Payer: Self-pay | Admitting: Urgent Care

## 2017-01-17 ENCOUNTER — Telehealth: Payer: Self-pay | Admitting: Family Medicine

## 2017-01-17 DIAGNOSIS — I2699 Other pulmonary embolism without acute cor pulmonale: Secondary | ICD-10-CM

## 2017-01-17 DIAGNOSIS — R1030 Lower abdominal pain, unspecified: Secondary | ICD-10-CM

## 2017-01-17 DIAGNOSIS — Z7901 Long term (current) use of anticoagulants: Secondary | ICD-10-CM

## 2017-01-17 LAB — BASIC METABOLIC PANEL
BUN / CREAT RATIO: 14 (ref 9–23)
BUN: 11 mg/dL (ref 6–20)
CO2: 21 mmol/L (ref 20–29)
CREATININE: 0.81 mg/dL (ref 0.57–1.00)
Calcium: 9.4 mg/dL (ref 8.7–10.2)
Chloride: 104 mmol/L (ref 96–106)
GFR calc non Af Amer: 99 mL/min/{1.73_m2} (ref >59)
GFR, EST AFRICAN AMERICAN: 114 mL/min/{1.73_m2} (ref >59)
GLUCOSE: 81 mg/dL (ref 65–99)
Potassium: 4.3 mmol/L (ref 3.5–5.2)
SODIUM: 140 mmol/L (ref 134–144)

## 2017-01-17 NOTE — Telephone Encounter (Signed)
PT CALLING TO LET US KNOW THAT WE SENT HER TO ER YESTERDAY THEY SENT HER RIGHT BACK HOME DIDN'T DO CT OF HER STOMACH BECAUSE THEY DIDN'T WANT HER TO GET THE RADIATION SHE STATES THAT HER STOMACH IS STILL HOT AND THE MASS HAS GOTTEN BIGGER SINCE YESTERDAY NEED SOMEONE TO GIVE HER A CALL AS SOON AS POSSIBLE

## 2017-01-17 NOTE — Telephone Encounter (Signed)
Enrique SackKendra from [facility] precertification center called requesting retro-auth for pt CT that was scheduled yesterday. [employer] was unable to approve it due to the fact it was scheduled between their normal business hours of 7am-7pm. I proceeded to call them to explain the situation and saw that the pt did not have this procedure done and was sent home before it was done. I tried calling the precertification center to verify that this was not actually performed so I could let [employer] know. I am still waiting to hear back.

## 2017-01-17 NOTE — Telephone Encounter (Signed)
Kendra's phone number is [phone] ext. [phone] and the case number for [facility] is [id number]

## 2017-01-17 NOTE — Telephone Encounter (Signed)
VM full could not leave a message 

## 2017-01-18 ENCOUNTER — Telehealth: Payer: Self-pay | Admitting: Urgent Care

## 2017-01-18 DIAGNOSIS — Z7901 Long term (current) use of anticoagulants: Secondary | ICD-10-CM

## 2017-01-18 DIAGNOSIS — R1030 Lower abdominal pain, unspecified: Secondary | ICD-10-CM

## 2017-01-18 DIAGNOSIS — I2699 Other pulmonary embolism without acute cor pulmonale: Secondary | ICD-10-CM

## 2017-01-18 NOTE — Telephone Encounter (Signed)
Spoke with [facility], they stated the current order will not show the area where the hematoma is. States to view the hematoma area you will need to put in order for a limited pelvic u/s - if you need to view reproductive organs then you will need a complete pelvic. If you do not need views of the upper abdomen please cancel the abd u/s currently ordered.

## 2017-01-18 NOTE — Telephone Encounter (Signed)
Pt called and left vm at referrals and said she was sent home from the hospital two days ago without having CT done because they told her she had already had 3 CT's done. She said they told her she could do an Ultrasound. The pt wanted to know if this was something Urban GibsonMani would like her to do because she is still having pain and it feels like it is getting bigger. She can be reached at [phone] or her desk at [phone].

## 2017-01-18 NOTE — Telephone Encounter (Signed)
[facility] is calling to verify an ultrasound order.  She is currently ordered to have a complete abd ultrasound but this type will not cover lower abd.  Please advise [phone]

## 2017-01-18 NOTE — Addendum Note (Signed)
Addended by: Wallis BambergMANI, Lanny Donoso on: 01/18/2017 02:37 PM   Modules accepted: Orders

## 2017-01-18 NOTE — Addendum Note (Signed)
Addended by: Wallis BambergMANI, Mahrosh Donnell on: 01/18/2017 02:39 PM   Modules accepted: Orders

## 2017-01-18 NOTE — Telephone Encounter (Signed)
New order for limited pelvic U/S placed.

## 2017-01-18 NOTE — Telephone Encounter (Signed)
I discussed case with Dr. Norberto SorensonEva Shaw. We will pursue abdominal U/S to further evaluate patient's abdominal pain. Her cbc, bmet have been reassuring, last drawn 01/16/2017. I will contact the TL desk for them to set this up and notify patient. The order has been placed.

## 2017-01-19 ENCOUNTER — Telehealth: Payer: Self-pay | Admitting: Urgent Care

## 2017-01-19 ENCOUNTER — Telehealth: Payer: Self-pay | Admitting: Physician Assistant

## 2017-01-19 ENCOUNTER — Ambulatory Visit (HOSPITAL_BASED_OUTPATIENT_CLINIC_OR_DEPARTMENT_OTHER)
Admission: RE | Admit: 2017-01-19 | Discharge: 2017-01-19 | Disposition: A | Discharge status: Home / Self Care | Payer: 59 | Source: Ambulatory Visit | Attending: Urgent Care | Admitting: Urgent Care

## 2017-01-19 ENCOUNTER — Ambulatory Visit (HOSPITAL_BASED_OUTPATIENT_CLINIC_OR_DEPARTMENT_OTHER): Admission: RE | Admit: 2017-01-19 | Payer: 59 | Source: Ambulatory Visit

## 2017-01-19 DIAGNOSIS — Z7901 Long term (current) use of anticoagulants: Secondary | ICD-10-CM | POA: Insufficient documentation

## 2017-01-19 DIAGNOSIS — I2699 Other pulmonary embolism without acute cor pulmonale: Secondary | ICD-10-CM | POA: Diagnosis not present

## 2017-01-19 DIAGNOSIS — R935 Abnormal findings on diagnostic imaging of other abdominal regions, including retroperitoneum: Secondary | ICD-10-CM | POA: Diagnosis not present

## 2017-01-19 DIAGNOSIS — R1030 Lower abdominal pain, unspecified: Secondary | ICD-10-CM | POA: Diagnosis not present

## 2017-01-19 DIAGNOSIS — R1032 Left lower quadrant pain: Secondary | ICD-10-CM | POA: Diagnosis not present

## 2017-01-19 NOTE — Telephone Encounter (Signed)
[place] [place] called and U/S results are in. I tried to see if we needed to hold pt but could not reach anyone so they are letting her go home and would like us to call the pt with results at [phone].

## 2017-01-19 NOTE — Telephone Encounter (Signed)
Pt is looking for ultrasound results   Vest number [id number] or [phone]

## 2017-01-22 ENCOUNTER — Ambulatory Visit (HOSPITAL_BASED_OUTPATIENT_CLINIC_OR_DEPARTMENT_OTHER): Payer: 59 | Admitting: Hematology and Oncology

## 2017-01-22 ENCOUNTER — Encounter: Payer: Self-pay | Admitting: Hematology and Oncology

## 2017-01-22 DIAGNOSIS — I2690 Septic pulmonary embolism without acute cor pulmonale: Secondary | ICD-10-CM

## 2017-01-22 DIAGNOSIS — D6862 Lupus anticoagulant syndrome: Secondary | ICD-10-CM | POA: Diagnosis not present

## 2017-01-22 DIAGNOSIS — Z7901 Long term (current) use of anticoagulants: Secondary | ICD-10-CM | POA: Diagnosis not present

## 2017-01-22 DIAGNOSIS — I2782 Chronic pulmonary embolism: Secondary | ICD-10-CM

## 2017-01-22 MED ORDER — APIXABAN 5 MG PO TABS: 5.0000 mg | ORAL_TABLET | Freq: Two times a day (BID) | ORAL | 11 refills | Status: DC

## 2017-01-22 NOTE — Telephone Encounter (Signed)
Have we completed these forms? Patient has called several times looking for them and they have not been returned to my box either by [place]Stephanie or [place]. Can someone please let me know today is the 5th business day. Thank you!

## 2017-01-22 NOTE — Assessment & Plan Note (Signed)
Pulmonary embolism diagnosed 12/30/2016 when she presented with chest pain and leg pains: Right lower lobe pulmonary artery   Current treatment: Lovenox injections 1 mg/kg subcutaneous twice a day  Planned treatment: Eliquis Workup positive for lupus anticoagulant. I discussed with the patient extensively about the significance of antiphospholipid antibodies and how the increased risk of blood clotting.  Plan: In order to confirm the presence of antiphospholipid antibody syndrome, we need to repeat the lupus anticoagulant testing in 3 months.  Return to clinic in 3 months with labs done ahead of time and follow-up.

## 2017-01-22 NOTE — Progress Notes (Signed)
Patient Care Team: Garnetta BuddyEnglish, Stephanie D, [place] as PCP - General (Physician Assistant)  DIAGNOSIS:  Encounter Diagnosis  Name Primary?  . 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 She is currently on Lovenox injections and is here also to discuss switching her from injection therapy to oral therapy. Her breathing appears to have improved slowly.  REVIEW OF SYSTEMS:   Constitutional: Denies fevers, chills or abnormal weight loss Eyes: Denies blurriness of vision Ears, nose, mouth, throat, and face: Denies mucositis or sore throat Respiratory: Denies cough, dyspnea or wheezes Cardiovascular: Denies palpitation, chest discomfort Gastrointestinal:  Denies nausea, heartburn or change in bowel habits Skin: Denies abnormal skin rashes Lymphatics: Denies new lymphadenopathy or easy bruising Neurological:Denies numbness, tingling or new weaknesses Behavioral/Psych: Mood is stable, no new changes  Extremities: No lower extremity edema All other systems were reviewed with the patient and are negative.  I have reviewed the past medical history, past surgical history, social history and family history with the patient and they are unchanged from previous note.  ALLERGIES:  is allergic to bactrim; prednisone; nsaids; other; pineapple; procardia [nifedipine]; and vicodin [hydrocodone-acetaminophen].  MEDICATIONS:  Current Outpatient Prescriptions  Medication Sig Dispense Refill  . acetaminophen (TYLENOL) 500 MG tablet Take 1,000 mg by mouth every 6 (six) hours as needed for headache (pain).     Marland Kitchen. albuterol (PROVENTIL HFA;VENTOLIN HFA) 108 (90 Base) MCG/ACT inhaler Inhale 2 puffs into the lungs every  6 (six) hours as needed for wheezing or shortness of breath.    Marland Kitchen. albuterol (PROVENTIL) (2.5 MG/3ML) 0.083% nebulizer solution Take 2.5 mg by nebulization every 6 (six) hours as needed for wheezing or shortness of breath.    Marland Kitchen. apixaban (ELIQUIS STARTER PACK) 5 MG TABS tablet Take 1 tablet (5 mg total) by mouth 2 (two) times daily. 60 tablet 11  . hydroxychloroquine (PLAQUENIL) 200 MG tablet Take 200 mg by mouth daily.    . methylPREDNISolone (MEDROL) 4 MG tablet Take 4 mg by mouth daily.    . Multiple Vitamin (MULTIVITAMIN WITH MINERALS) TABS tablet Take 1 tablet by mouth daily.    Marland Kitchen. oxyCODONE-acetaminophen (PERCOCET/ROXICET) 5-325 MG tablet Take 1 tablet by mouth every 6 (six) hours as needed for moderate pain. 30 tablet 0  . senna (SENOKOT) 8.6 MG TABS tablet Take 1 tablet (8.6 mg total) by mouth 2 (two) times daily. 120 each 0   No current facility-administered medications for this visit.     PHYSICAL EXAMINATION: ECOG PERFORMANCE STATUS: 1 - Symptomatic but completely ambulatory  Vitals:   01/22/17 1114  BP: (!) 141/101  Pulse: 92  Resp: 18  Temp: 98.2 F (36.8 C)   Filed Weights   01/22/17 1114  Weight: (!) 336 lb 12.8 oz (152.8 kg)    GENERAL:alert, no distress and comfortable SKIN: skin color, texture, turgor are normal, no rashes or significant lesions EYES: normal, Conjunctiva are pink and non-injected, sclera clear OROPHARYNX:no exudate, no erythema and lips, buccal mucosa, and tongue normal  NECK: supple, thyroid normal size, non-tender, without nodularity LYMPH:  no palpable lymphadenopathy in the cervical, axillary or inguinal LUNGS: clear to auscultation and percussion with normal breathing effort HEART: regular rate & rhythm and no murmurs and no lower extremity edema ABDOMEN:abdomen  soft, non-tender and normal bowel sounds MUSCULOSKELETAL:no cyanosis of digits and no clubbing  NEURO: alert & oriented x 3 with fluent speech, no focal motor/sensory  deficits EXTREMITIES: No lower extremity edema  LABORATORY DATA:  I have reviewed the data as listed   Chemistry      Component Value Date/Time   NA 138 01/16/2017 1716   NA 140 01/16/2017 1531   K 3.6 01/16/2017 1716   CL 106 01/16/2017 1716   CO2 24 01/16/2017 1716   BUN 9 01/16/2017 1716   BUN 11 01/16/2017 1531   CREATININE 0.68 01/16/2017 1716      Component Value Date/Time   CALCIUM 9.3 01/16/2017 1716   ALKPHOS 106 12/29/2016 1356   AST 29 12/29/2016 1356   ALT 24 12/29/2016 1356   BILITOT 0.4 12/29/2016 1356       Lab Results  Component Value Date   WBC 6.3 01/16/2017   HGB 12.4 01/16/2017   HCT 38.3 01/16/2017   MCV 83.8 01/16/2017   PLT 283 01/16/2017   NEUTROABS 2.4 12/26/2016    ASSESSMENT & PLAN:  Pulmonary embolism (HCC) Pulmonary embolism diagnosed 12/30/2016 when she presented with chest pain and leg pains: Right lower lobe pulmonary artery   Current treatment: Lovenox injections 1 mg/kg subcutaneous twice a day  Planned treatment: Eliquis 5 mg by mouth twice a day I discussed the risks and benefits of Eliquis including the risk of severe bleeding. Workup positive for lupus anticoagulant. I discussed with the patient extensively about the significance of antiphospholipid antibodies and how the increased risk of blood clotting.  Plan: In order to confirm the presence of antiphospholipid antibody syndrome, we need to repeat the lupus anticoagulant testing in 3 months.  Return to clinic in 3 months with labs done ahead of time and follow-up.  I spent 25 minutes talking to the patient of which more than half was spent in counseling and coordination of care.  Orders Placed This Encounter  Procedures  . Lupus anticoagulant panel    Standing Status:   Future    Standing Expiration Date:   02/26/2018   The patient has a good understanding of the overall plan. she agrees with it. she will call with any problems that may develop before the next visit  here.   Sabas Sous, MD 01/22/17

## 2017-01-23 ENCOUNTER — Other Ambulatory Visit: Payer: Self-pay

## 2017-01-23 MED ORDER — APIXABAN 5 MG PO TABS: 5.0000 mg | ORAL_TABLET | Freq: Two times a day (BID) | ORAL | 11 refills | Status: DC

## 2017-01-23 NOTE — Telephone Encounter (Signed)
See previous message

## 2017-01-23 NOTE — Telephone Encounter (Signed)
I am just returning yesterday.  I have filled out what I can, but her start date, I am unsure of.  If you can contact her and ask start date, and fill this in.  If it is less than 6 weeks, I am fine with just filling in the start date.

## 2017-01-23 NOTE — Telephone Encounter (Signed)
Message sent to [facility] English PA. Awaiting reply.

## 2017-01-23 NOTE — Telephone Encounter (Signed)
Please look at US results and advise. Thanks

## 2017-01-23 NOTE — Telephone Encounter (Signed)
Spoke with pt and they would like call back regarding ultrasound results. Pt is still having abdominal pain. Please advise.

## 2017-01-25 ENCOUNTER — Ambulatory Visit: Payer: 59 | Admitting: Hematology and Oncology

## 2017-01-25 NOTE — Telephone Encounter (Signed)
Paperwork scanned and faxed on 01/25/17

## 2017-01-26 NOTE — Telephone Encounter (Signed)
There was a hematoma found where there was possible lovenox injection.  Was this discussed at the appointment with Dr. Pamelia HoitGudena?

## 2017-01-26 NOTE — Telephone Encounter (Signed)
Spoke to patient advised her US showed hematoma at the Lovenox injection area. The patient stated she did not mention it to Dr Pamelia HoitGudena because she did not know the results at the time. Lovenox injections has been stopped and now she is taking Eliquis. Also,she stated now there are 4-5 knot.

## 2017-02-01 ENCOUNTER — Ambulatory Visit (INDEPENDENT_AMBULATORY_CARE_PROVIDER_SITE_OTHER): Payer: 59 | Admitting: Physician Assistant

## 2017-02-01 ENCOUNTER — Encounter (HOSPITAL_BASED_OUTPATIENT_CLINIC_OR_DEPARTMENT_OTHER): Payer: Self-pay

## 2017-02-01 ENCOUNTER — Ambulatory Visit (HOSPITAL_BASED_OUTPATIENT_CLINIC_OR_DEPARTMENT_OTHER)
Admission: RE | Admit: 2017-02-01 | Discharge: 2017-02-01 | Disposition: A | Discharge status: Home / Self Care | Payer: 59 | Source: Ambulatory Visit | Attending: Physician Assistant | Admitting: Physician Assistant

## 2017-02-01 ENCOUNTER — Telehealth: Payer: Self-pay | Admitting: Physician Assistant

## 2017-02-01 VITALS — BP 140/96 | HR 98 | Temp 98.0°F | Resp 16 | Ht 68.75 in | Wt 332.8 lb

## 2017-02-01 DIAGNOSIS — R42 Dizziness and giddiness: Secondary | ICD-10-CM

## 2017-02-01 DIAGNOSIS — I2782 Chronic pulmonary embolism: Secondary | ICD-10-CM

## 2017-02-01 DIAGNOSIS — R0602 Shortness of breath: Secondary | ICD-10-CM | POA: Diagnosis not present

## 2017-02-01 DIAGNOSIS — R0789 Other chest pain: Secondary | ICD-10-CM

## 2017-02-01 DIAGNOSIS — I2690 Septic pulmonary embolism without acute cor pulmonale: Secondary | ICD-10-CM

## 2017-02-01 DIAGNOSIS — Z7901 Long term (current) use of anticoagulants: Secondary | ICD-10-CM | POA: Diagnosis present

## 2017-02-01 DIAGNOSIS — J9811 Atelectasis: Secondary | ICD-10-CM | POA: Diagnosis not present

## 2017-02-01 DIAGNOSIS — I2699 Other pulmonary embolism without acute cor pulmonale: Secondary | ICD-10-CM | POA: Diagnosis not present

## 2017-02-01 DIAGNOSIS — Z86711 Personal history of pulmonary embolism: Secondary | ICD-10-CM | POA: Insufficient documentation

## 2017-02-01 LAB — POCT URINE PREGNANCY: Preg Test, Ur: NEGATIVE

## 2017-02-01 LAB — POCT CBC
GRANULOCYTE PERCENT: 46.6 % (ref 37–80)
HCT, POC: 35.1 % — AB (ref 37.7–47.9)
HEMOGLOBIN: 11.4 g/dL — AB (ref 12.2–16.2)
Lymph, poc: 2.2 (ref 0.6–3.4)
MCH: 26.7 pg — AB (ref 27–31.2)
MCHC: 32.4 g/dL (ref 31.8–35.4)
MCV: 82.5 fL (ref 80–97)
MID (cbc): 0.4 (ref 0–0.9)
MPV: 6.7 fL (ref 0–99.8)
PLATELET COUNT, POC: 388 10*3/uL (ref 142–424)
POC Granulocyte: 2.2 (ref 2–6.9)
POC LYMPH PERCENT: 45.7 %L (ref 10–50)
POC MID %: 7.7 %M (ref 0–12)
RBC: 4.25 M/uL (ref 4.04–5.48)
RDW, POC: 15.4 %
WBC: 4.8 10*3/uL (ref 4.6–10.2)

## 2017-02-01 MED ORDER — IOPAMIDOL (ISOVUE-370) INJECTION 76%
100.0000 mL | Freq: Once | INTRAVENOUS | Status: AC | PRN
  Administered 2017-02-01: 100 mL via INTRAVENOUS

## 2017-02-01 NOTE — Patient Instructions (Addendum)
  [facility] exam. Please do not have anything to eat or drink prior to this exam.    IF you received an x-ray today, you will receive an invoice from [facility]. Please contact [facility] at [phone] with questions or concerns regarding your invoice.   IF you received labwork today, you will receive an invoice from [facility]. Please contact [facility] at [phone] with questions or concerns regarding your invoice.   Our billing staff will not be able to assist you with questions regarding bills from these companies.  You will be contacted with the lab results as soon as they are available. The fastest way to get your results is to activate your My Chart account. Instructions are located on the last page of this paperwork. If you have not heard from us regarding the results in 2 weeks, please contact this office.

## 2017-02-01 NOTE — Telephone Encounter (Signed)
Pt is calling about the results of her ct scan -radiology office told her it would be about an hour and she could get the results   Best number [phone]

## 2017-02-02 LAB — CMP14+EGFR
ALBUMIN: 4.2 g/dL (ref 3.5–5.5)
ALK PHOS: 108 IU/L (ref 39–117)
ALT: 19 IU/L (ref 0–32)
AST: 17 IU/L (ref 0–40)
Albumin/Globulin Ratio: 1.4 (ref 1.2–2.2)
BUN/Creatinine Ratio: 15 (ref 9–23)
BUN: 12 mg/dL (ref 6–20)
Bilirubin Total: <0.2 mg/dL (ref 0.0–1.2)
CALCIUM: 9.7 mg/dL (ref 8.7–10.2)
CO2: 21 mmol/L (ref 20–29)
CREATININE: 0.78 mg/dL (ref 0.57–1.00)
Chloride: 102 mmol/L (ref 96–106)
GFR calc Af Amer: 120 mL/min/{1.73_m2} (ref >59)
GFR calc non Af Amer: 104 mL/min/{1.73_m2} (ref >59)
GLUCOSE: 93 mg/dL (ref 65–99)
Globulin, Total: 3 g/dL (ref 1.5–4.5)
Potassium: 4.2 mmol/L (ref 3.5–5.2)
Sodium: 142 mmol/L (ref 134–144)
Total Protein: 7.2 g/dL (ref 6.0–8.5)

## 2017-02-04 ENCOUNTER — Encounter: Payer: Self-pay | Admitting: Physician Assistant

## 2017-02-04 NOTE — Progress Notes (Signed)
[facility] AT [facility], [place] [address] [phone]  Date:  02/01/2017   Name:  Hayley Webb   DOB:  [birth year]   MRN:  [id number]  PCP:  Joretta Bachelor, PA    History of Present Illness:  Hayley Webb is a 31 y.o. female patient with recent pulmonary embolism who presents to PCP with  Chief Complaint  Patient presents with  . Chest Pain    Began last week - intemittant  . Shortness of Breath     Patient has noticed increased chest pain for the last week.   Progressively worsening with shortness of breath and now in the last 2 days, lightheaded with short distance walking.  Diaphoresis and abnormal nausea denied.  Has racing heart.  She describes the chest pain as pressure and radiates around the back.  No emesis.   In review patient has a hx of pulmonary embolism about 1 month ago which appeared to be worsened with xarelto initially.  She was then switched to Lovenox.   She was switched to eliquis 1 week ago by oncologist, Alden Server, with work up of antiphospholipid abx syndrome.   pe clot was detected at the right upper, left upper and bilateral lower lobes.  She also had abdominal pain and was seen 10 days ago, but [name], PA-C.  These appeared to be subcutaneous hematoma.  New finding of these have showed consistent with the Lovenox injections.  These are reported to have improved today.     Patient Active Problem List   Diagnosis Date Noted  . Pulmonary embolism ([place]) 12/26/2016  . SOB (shortness of breath)   . Vaginal delivery--VE assist 10/25/2013  . 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 Obesity-BMI 47 05/09/2013  . Hirsutism 10/02/2012  . STD (female) 01/18/2012  . Lupus   . Asthma   . HSV infection     Past Medical History:  Diagnosis Date  . Anxiety    doing good now  . Asthma   . Headache(784.0)   . HSV infection   . Infection    UTI  . Lupus    dx age 70  .  Pulmonary embolism ([place]) 06/4/82011  . 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 MOUTH SURGERY     2 teeth removed- 1 wisdom and 1 in front of it  . TONSILLECTOMY AND ADENOIDECTOMY  1998  . WISDOM TOOTH EXTRACTION      Social History  Substance Use Topics  . Smoking status: Never Smoker  . Smokeless tobacco: Never Used  . Alcohol use No    Family History  Problem Relation Age of Onset  . Diabetes Maternal Aunt   . Cancer Maternal Grandmother 72       COLON CA  . Diabetes Maternal Grandmother   . Hypertension Maternal Grandfather   . Cancer Maternal Grandfather        prostate  . Asthma Mother   . Asthma Brother   . Cancer Paternal Grandmother        breast  . Lupus Paternal Grandmother   . Anesthesia problems Neg Hx   . Hypotension Neg Hx   . Malignant hyperthermia Neg Hx   . Pseudochol deficiency Neg Hx     Allergies  Allergen Reactions  . Bactrim Anaphylaxis and Hives  . Prednisone Anaphylaxis and Hives  Has been on Dexamethasone without issue.  . Nsaids Other (See Comments)    Has Lupus, reccommended to avoid NSAIDs  . Other Itching    Shrimp:throat itching "can eat other shellfish"  . Pineapple Itching  . Procardia [Nifedipine] Swelling    Swelling of tongue  . Vicodin [Hydrocodone-Acetaminophen] Hives and Swelling    Can take Percocet without difficulty    Medication list has been reviewed and updated.  Current Outpatient Prescriptions on File Prior to Visit  Medication Sig Dispense Refill  . acetaminophen (TYLENOL) 500 MG tablet Take 1,000 mg by mouth every 6 (six) hours as needed for headache (pain).     Marland Kitchen albuterol (PROVENTIL HFA;VENTOLIN HFA) 108 (90 Base) MCG/ACT inhaler Inhale 2 puffs into the lungs every 6 (six) hours as needed for wheezing or shortness of breath.    Marland Kitchen albuterol (PROVENTIL) (2.5 MG/3ML) 0.083% nebulizer solution Take 2.5 mg by nebulization every 6 (six)  hours as needed for wheezing or shortness of breath.    Marland Kitchen apixaban (ELIQUIS) 5 MG TABS tablet Take 1 tablet (5 mg total) by mouth 2 (two) times daily. 60 tablet 11  . hydroxychloroquine (PLAQUENIL) 200 MG tablet Take 200 mg by mouth daily.    . methylPREDNISolone (MEDROL) 4 MG tablet Take 4 mg by mouth daily.    . Multiple Vitamin (MULTIVITAMIN WITH MINERALS) TABS tablet Take 1 tablet by mouth daily.    Marland Kitchen oxyCODONE-acetaminophen (PERCOCET/ROXICET) 5-325 MG tablet Take 1 tablet by mouth every 6 (six) hours as needed for moderate pain. 30 tablet 0  . senna (SENOKOT) 8.6 MG TABS tablet Take 1 tablet (8.6 mg total) by mouth 2 (two) times daily. 120 each 0   No current facility-administered medications on file prior to visit.     ROS ROS otherwise unremarkable unless listed above.  Physical Examination: BP (!) 140/96   Pulse 98   Temp 98 F (36.7 C) (Oral)   Resp 16   Ht 5' 8.75" (1.746 m)   Wt (!) 332 lb 12.8 oz (151 kg)   LMP 01/16/2017 (Exact Date)   SpO2 99%   BMI 49.50 kg/m  Ideal Body Weight: Weight in (lb) to have BMI = 25: 167.7  Physical Exam  Constitutional: She is oriented to person, place, and time. She appears well-developed and well-nourished. No distress.  HENT:  Head: Normocephalic and atraumatic.  Right Ear: External ear normal.  Left Ear: External ear normal.  Eyes: Pupils are equal, round, and reactive to light. Conjunctivae and EOM are normal.  Cardiovascular: Normal rate, regular rhythm and normal heart sounds.   Pulses:      Dorsalis pedis pulses are 2+ on the right side, and 2+ on the left side.  Pulmonary/Chest: Effort normal. No accessory muscle usage. No apnea. No respiratory distress. She has no decreased breath sounds. She has no wheezes. She has no rhonchi.  Neurological: She is alert and oriented to person, place, and time.  Skin: She is not diaphoretic.  Psychiatric: She has a normal mood and affect. Her behavior is normal.    Ct Angio Chest Pe W  Or Wo Contrast  Result Date: 02/01/2017 CLINICAL DATA:  Patient on blood thinner for prior pulmonary embolus. Chest pain shortness of breath. EXAM: CT ANGIOGRAPHY CHEST WITH CONTRAST TECHNIQUE: Multidetector CT imaging of the chest was performed using the standard protocol during bolus administration of intravenous contrast. Multiplanar CT image reconstructions and MIPs were obtained to evaluate the vascular anatomy. CONTRAST:  100 cc Isovue 370 COMPARISON:  Chest CT 12/29/2016; 01/05/2017. FINDINGS: Cardiovascular: Normal heart size. No pericardial effusion. Aorta and main pulmonary artery are normal in caliber. Adequate opacification of the pulmonary arterial system. No filling defects are identified within the pulmonary arteries to suggest residual pulmonary embolus. Mediastinum/Nodes: No enlarged axillary, mediastinal or hilar lymphadenopathy. Prominent mediastinal lymph nodes are unchanged. Normal esophagus. Lungs/Pleura: Central airways are patent. Dependent atelectasis within the bilateral lower lobes. No pleural effusion. No pneumothorax. No large area pulmonary consolidation. Upper Abdomen: No acute process. Musculoskeletal: No aggressive or acute appearing osseous lesions. Review of the MIP images confirms the above findings. IMPRESSION: Previously described pulmonary emboli are not visualized on current examination. No new pulmonary emboli identified. Bibasilar atelectasis. Electronically Signed   By: Lovey Newcomer M.D.   On: 02/01/2017 13:10   Ct Angio Chest Pe W Or Wo Contrast  Result Date: 01/05/2017 CLINICAL DATA:  Recent history of PE worsening shortness of breath and left upper chest pain EXAM: CT ANGIOGRAPHY CHEST WITH CONTRAST TECHNIQUE: Multidetector CT imaging of the chest was performed using the standard protocol during bolus administration of intravenous contrast. Multiplanar CT image reconstructions and MIPs were obtained to evaluate the vascular anatomy. CONTRAST:  100 mL Isovue 370  intravenous COMPARISON:  Radiograph 01/01/2017, CT chest 12/29/2016, 12/26/2016 FINDINGS: Cardiovascular: Non aneurysmal aorta. Suboptimal contrast bolus. The patient has known right upper and bilateral lower lobe emboli are poorly visualized on today's study. Possible under filling of left upper lobe segmental branch. RV LV ratio is 0.8. No pericardial effusion. Mediastinum/Nodes: No enlarged mediastinal, hilar, or axillary lymph nodes. Thyroid gland, trachea, and esophagus demonstrate no significant findings. Lungs/Pleura: Patchy ground-glass densities likely reflect diffuse atelectasis. No consolidation, pneumothorax or pleural effusion. Upper Abdomen: No acute abnormality. Musculoskeletal: No chest wall abnormality. No acute or significant osseous findings. Review of the MIP images confirms the above findings. IMPRESSION: 1. Very suboptimal contrast opacification of pulmonary arterial system. The patient's known emboli in the right upper and bilateral lower lobes are poorly visualized today. Relative underfilling of a left upper lobe segmental pulmonary artery branch, additional embolus cannot be excluded. This appears in a different location than was suggested on the most recent prior. RV LV ratio is normal. 2. Patchy ground-glass lung densities likely reflect diffuse atelectasis. Electronically Signed   By: Donavan Foil M.D.   On: 01/05/2017 21:32   US Pelvis Limited  Result Date: 01/19/2017 CLINICAL DATA:  Left lower quadrant palpable mass EXAM: LIMITED ULTRASOUND OF PELVIS TECHNIQUE: Limited transabdominal ultrasound examination of the pelvis was performed. COMPARISON:  None. FINDINGS: An area of clinical concern there is a 4.3 x 1.9 x 1.9 cm palpable abnormality which shows some central decreased echogenicity noted. This likely represents a focal hematoma. Patient correlates this represents a prior site of Lovenox injection. IMPRESSION: Likely subcutaneous hematoma as described. Recommend clinical  follow-up Electronically Signed   By: Inez Catalina M.D.   On: 01/19/2017 10:14     Assessment and Plan: Ghina Bittinger is a 31 y.o. female who is here today for cc of chest pain and sob orthostats are looking normal Blood work unrevealing today. Walked around the facility vigorously and O2 sat no lower than 93% today.   As she has hx of uncontrolled PEs and recent change of anticoagulant therapy, we proceeded with CTA.  This has reported to improve with bibasilar atelectasis.  This likely can be due to poor respiratory work, following PE obstructions.  Will have her do spirometer work, but respiratory therapy may be necessary.  PEs have improved. Advised heat to the abdomen.  These will reabsorb, but slowly Other chest pain - Plan: EKG 12-Lead, Orthostatic vital signs, POCT CBC, CMP14+EGFR, CT ANGIO CHEST PE W OR WO CONTRAST, POCT urine pregnancy  Lightheaded - Plan: EKG 12-Lead, Orthostatic vital signs, POCT CBC, CMP14+EGFR, POCT urine pregnancy  Other acute pulmonary embolism without acute cor pulmonale (HCC) - Plan: EKG 12-Lead, Orthostatic vital signs, POCT CBC, CMP14+EGFR, CT ANGIO CHEST PE W OR WO CONTRAST, Pathologist smear review, POCT urine pregnancy  Mild bibasilar atelectasis  Ivar Drape, PA-C [facility] 7/15/20189:31 AM

## 2017-02-05 NOTE — Telephone Encounter (Signed)
Please review CT Scan so that patient may be notified.

## 2017-02-05 NOTE — Telephone Encounter (Signed)
PATIENT IS CALLING STEPHANIE BACK TO GET THE RESULTS OF HER CT SCAN. BEST PHONE [phone] (CELL) PHARMACY CHOICE IF NEEDED IS [facility] ON [address]. MBC

## 2017-02-06 NOTE — Telephone Encounter (Signed)
Playing a slight phone tag.   Please contact patient, the CT looked good.  The clots that were present are not visualized according to radiologist.  There was some atelectasis in your lower lobes.  This may be due to not being able to utilize your lung volume.  Your vitals and labs were normal here.  I have been trying to figure out the best way for you to have improvement of your symptoms, and I think what we should do is have pulmonary rehab at this time.  Likely need deep breathing exercises.  This will help to eliminate some of your symptoms of shortness of breath, dizziness, and make you less prone to infection.   If you are having fever, coughing, please return.   Referral team will contact you regarding appointment.

## 2017-02-06 NOTE — Telephone Encounter (Signed)
LVM to call back.

## 2017-02-07 ENCOUNTER — Telehealth: Payer: Self-pay | Admitting: Physician Assistant

## 2017-02-07 NOTE — Telephone Encounter (Signed)
Pt is needing a work note to return to work.  Contact number [phone].  She also states it is ok to fax it to [phone].

## 2017-02-08 NOTE — Telephone Encounter (Signed)
IC pt with results per Trena PlattStephanie English PA note. Sent note to Steph to put referral in for Pulmonary Rehab.  Pt also needs referral by Judeth CornfieldStephanie to new rheumatologist-she want to go to [facility] Dev[facility]. Please put order in.

## 2017-02-08 NOTE — Telephone Encounter (Signed)
Note states our referral team will contact pt re: pulmonary rehab.  Do not see orders for this.

## 2017-02-08 NOTE — Telephone Encounter (Signed)
This was the respiratory therapy. With note in it

## 2017-02-09 ENCOUNTER — Encounter (HOSPITAL_BASED_OUTPATIENT_CLINIC_OR_DEPARTMENT_OTHER): Payer: Self-pay

## 2017-02-09 ENCOUNTER — Inpatient Hospital Stay (HOSPITAL_BASED_OUTPATIENT_CLINIC_OR_DEPARTMENT_OTHER)
Admission: EM | Admit: 2017-02-09 | Discharge: 2017-02-19 | DRG: 176 | Disposition: A | Discharge status: Home / Self Care | Payer: 59 | Attending: Internal Medicine | Admitting: Internal Medicine

## 2017-02-09 ENCOUNTER — Emergency Department (HOSPITAL_BASED_OUTPATIENT_CLINIC_OR_DEPARTMENT_OTHER): Payer: 59

## 2017-02-09 DIAGNOSIS — Z8249 Family history of ischemic heart disease and other diseases of the circulatory system: Secondary | ICD-10-CM | POA: Diagnosis not present

## 2017-02-09 DIAGNOSIS — I2699 Other pulmonary embolism without acute cor pulmonale: Principal | ICD-10-CM | POA: Diagnosis present

## 2017-02-09 DIAGNOSIS — Z885 Allergy status to narcotic agent status: Secondary | ICD-10-CM | POA: Diagnosis not present

## 2017-02-09 DIAGNOSIS — Z6841 Body Mass Index (BMI) 40.0 and over, adult: Secondary | ICD-10-CM

## 2017-02-09 DIAGNOSIS — R0602 Shortness of breath: Secondary | ICD-10-CM | POA: Diagnosis not present

## 2017-02-09 DIAGNOSIS — Z79899 Other long term (current) drug therapy: Secondary | ICD-10-CM | POA: Diagnosis not present

## 2017-02-09 DIAGNOSIS — J45909 Unspecified asthma, uncomplicated: Secondary | ICD-10-CM | POA: Diagnosis present

## 2017-02-09 DIAGNOSIS — Z886 Allergy status to analgesic agent status: Secondary | ICD-10-CM

## 2017-02-09 DIAGNOSIS — Z882 Allergy status to sulfonamides status: Secondary | ICD-10-CM | POA: Diagnosis not present

## 2017-02-09 DIAGNOSIS — Z888 Allergy status to other drugs, medicaments and biological substances status: Secondary | ICD-10-CM

## 2017-02-09 DIAGNOSIS — I2601 Septic pulmonary embolism with acute cor pulmonale: Secondary | ICD-10-CM | POA: Diagnosis not present

## 2017-02-09 DIAGNOSIS — R319 Hematuria, unspecified: Secondary | ICD-10-CM | POA: Diagnosis present

## 2017-02-09 DIAGNOSIS — Z825 Family history of asthma and other chronic lower respiratory diseases: Secondary | ICD-10-CM | POA: Diagnosis not present

## 2017-02-09 DIAGNOSIS — Z7901 Long term (current) use of anticoagulants: Secondary | ICD-10-CM | POA: Diagnosis not present

## 2017-02-09 DIAGNOSIS — Z833 Family history of diabetes mellitus: Secondary | ICD-10-CM | POA: Diagnosis not present

## 2017-02-09 DIAGNOSIS — D6861 Antiphospholipid syndrome: Secondary | ICD-10-CM | POA: Diagnosis present

## 2017-02-09 DIAGNOSIS — F419 Anxiety disorder, unspecified: Secondary | ICD-10-CM | POA: Diagnosis present

## 2017-02-09 DIAGNOSIS — L930 Discoid lupus erythematosus: Secondary | ICD-10-CM | POA: Diagnosis not present

## 2017-02-09 DIAGNOSIS — R8290 Unspecified abnormal findings in urine: Secondary | ICD-10-CM | POA: Diagnosis present

## 2017-02-09 DIAGNOSIS — R079 Chest pain, unspecified: Secondary | ICD-10-CM | POA: Diagnosis not present

## 2017-02-09 DIAGNOSIS — K5900 Constipation, unspecified: Secondary | ICD-10-CM | POA: Diagnosis present

## 2017-02-09 DIAGNOSIS — Z86711 Personal history of pulmonary embolism: Secondary | ICD-10-CM

## 2017-02-09 DIAGNOSIS — I2690 Septic pulmonary embolism without acute cor pulmonale: Secondary | ICD-10-CM

## 2017-02-09 DIAGNOSIS — Z91013 Allergy to seafood: Secondary | ICD-10-CM | POA: Diagnosis not present

## 2017-02-09 DIAGNOSIS — M329 Systemic lupus erythematosus, unspecified: Secondary | ICD-10-CM | POA: Diagnosis present

## 2017-02-09 DIAGNOSIS — Z91018 Allergy to other foods: Secondary | ICD-10-CM

## 2017-02-09 DIAGNOSIS — Z7952 Long term (current) use of systemic steroids: Secondary | ICD-10-CM | POA: Diagnosis not present

## 2017-02-09 DIAGNOSIS — IMO0002 Reserved for concepts with insufficient information to code with codable children: Secondary | ICD-10-CM | POA: Diagnosis present

## 2017-02-09 DIAGNOSIS — R760 Raised antibody titer: Secondary | ICD-10-CM | POA: Diagnosis not present

## 2017-02-09 DIAGNOSIS — Z87440 Personal history of urinary (tract) infections: Secondary | ICD-10-CM | POA: Diagnosis not present

## 2017-02-09 DIAGNOSIS — J4520 Mild intermittent asthma, uncomplicated: Secondary | ICD-10-CM | POA: Diagnosis not present

## 2017-02-09 DIAGNOSIS — D6862 Lupus anticoagulant syndrome: Secondary | ICD-10-CM | POA: Diagnosis not present

## 2017-02-09 DIAGNOSIS — R0789 Other chest pain: Secondary | ICD-10-CM | POA: Diagnosis not present

## 2017-02-09 HISTORY — DX: Other pulmonary embolism without acute cor pulmonale: I26.99

## 2017-02-09 LAB — BASIC METABOLIC PANEL
Anion gap: 8 (ref 5–15)
BUN: 15 mg/dL (ref 6–20)
CO2: 26 mmol/L (ref 22–32)
CREATININE: 0.8 mg/dL (ref 0.44–1.00)
Calcium: 9.3 mg/dL (ref 8.9–10.3)
Chloride: 101 mmol/L (ref 101–111)
GFR calc Af Amer: >60 mL/min (ref >60)
Glucose, Bld: 115 mg/dL — ABNORMAL HIGH (ref 65–99)
POTASSIUM: 3.8 mmol/L (ref 3.5–5.1)
SODIUM: 135 mmol/L (ref 135–145)

## 2017-02-09 LAB — CBC
HCT: 35.9 % — ABNORMAL LOW (ref 36.0–46.0)
Hemoglobin: 11.9 g/dL — ABNORMAL LOW (ref 12.0–15.0)
MCH: 27.5 pg (ref 26.0–34.0)
MCHC: 33.1 g/dL (ref 30.0–36.0)
MCV: 83.1 fL (ref 78.0–100.0)
Platelets: 384 10*3/uL (ref 150–400)
RBC: 4.32 MIL/uL (ref 3.87–5.11)
RDW: 14.2 % (ref 11.5–15.5)
WBC: 6.3 10*3/uL (ref 4.0–10.5)

## 2017-02-09 LAB — TROPONIN I: Troponin I: <0.03 ng/mL (ref <0.03)

## 2017-02-09 MED ORDER — HEPARIN (PORCINE) IN NACL 100-0.45 UNIT/ML-% IJ SOLN
1600.0000 [IU]/h | INTRAMUSCULAR | Status: DC
Start: 2017-02-09 — End: 2017-02-19
  Administered 2017-02-09 – 2017-02-19 (×13): 1600 [IU]/h via INTRAVENOUS
  Filled 2017-02-09 (×15): qty 250

## 2017-02-09 MED ORDER — IOPAMIDOL (ISOVUE-370) INJECTION 76%
100.0000 mL | Freq: Once | INTRAVENOUS | Status: AC | PRN
  Administered 2017-02-09: 100 mL via INTRAVENOUS

## 2017-02-09 MED ORDER — MORPHINE SULFATE (PF) 4 MG/ML IV SOLN
4.0000 mg | Freq: Once | INTRAVENOUS | Status: AC
  Administered 2017-02-09: 4 mg via INTRAVENOUS
  Filled 2017-02-09: qty 1

## 2017-02-09 MED ORDER — ONDANSETRON HCL 4 MG/2ML IJ SOLN
4.0000 mg | Freq: Once | INTRAMUSCULAR | Status: AC
  Administered 2017-02-09: 4 mg via INTRAVENOUS
  Filled 2017-02-09: qty 2

## 2017-02-09 MED ORDER — HEPARIN BOLUS VIA INFUSION
5000.0000 [IU] | Freq: Once | INTRAVENOUS | Status: AC
  Administered 2017-02-09: 5000 [IU] via INTRAVENOUS

## 2017-02-09 NOTE — Progress Notes (Addendum)
ANTICOAGULATION CONSULT NOTE - Initial Consult  Pharmacy Consult for Heparin  Indication: pulmonary embolus  Patient Measurements: Height: 5\' 8"  (172.7 cm) Weight: (!) 337 lb (152.9 kg) IBW/kg (Calculated) : 63.9  Vital Signs: Temp: 98.3 F (36.8 C) (07/20 2007) Temp Source: Oral (07/20 2007) BP: 128/79 (07/20 2200) Pulse Rate: 88 (07/20 2200)  Labs:  Recent Labs  02/09/17 2026  HGB 11.9*  HCT 35.9*  PLT 384  CREATININE 0.80  TROPONINI <0.03    Estimated Creatinine Clearance: 163 mL/min (by C-G formula based on SCr of 0.8 mg/dL).   Medical History: Past Medical History:  Diagnosis Date  . Anxiety    doing good now  . Asthma   . Headache(784.0)   . HSV infection   . Infection    UTI  . Lupus    dx age 31  . Pulmonary embolism ([facility]) 06/4/82011  . STD (sexually transmitted disease) 02/2009   POSITIVE GC   Assessment: 31 y/o F currently being treated with Apixaban 5 mg BID for PE that was diagnosed on 12/30/2016. She was initially on Lovenox for several weeks then switched to Apixaban after hematology consult. [facility] pain and "knot in foot". CT Angio + for small PE (unsure if acute or residual from PE in June). Last dose of apixaban was about 12 hours ago, starting heparin now. Will likely need to use aPTT to dose heparin for the next 24-48 hours given apixaban influence on anti-Xa levels.   Goal of Therapy:  Heparin level 0.3-0.7 units/ml aPTT 66-102 seconds Monitor platelets by anticoagulation protocol: Yes   Plan:  -Heparin 5000 units BOLUS -Start heparin drip at 1600 units/hr -0600 HL/aPTT -Daily CBC/HL -Monitor for bleeding -Will likely need to ask hematology if pt needs to go back on Lovenox as outpatient therapy   Abran DukeLedford, Issa Kosmicki 02/09/2017,10:21 PM  ================================== Addendum 7:17 AM  Heparin level 0.7 and aPTT 100, both therapeutic and correlating, can use heparin level only to dose heparin from now on,  also planning to start heparin/warfarin bridge today, baseline INR is normal.   -Cont heparin at 1600 units/hr -Check 1400 HL to confirm -Warfarin 10 mg PO x 1 tonight at 1800 -Daily PT/INR -Monitor for bleeding  Abran DukeJames Odie Edmonds, PharmD, BCPS Clinical Pharmacist Phone: [phone]  ====================================

## 2017-02-09 NOTE — ED Triage Notes (Addendum)
C/o CP x today-being treated for PE dx in June-pt NAD-steady gait-also c/o "knot popped up on my foot when my pain CP started"

## 2017-02-09 NOTE — Progress Notes (Addendum)
31 yo F with lupus, morbid obesity, recently dx'd PE, also newly dx'd APLA syndrome followed by Dr. Pamelia HoitGudena, on Eliquis presents with chest pain.  BP 128/79   Pulse 88   Temp 98.3 F (36.8 C) (Oral)   Resp 14   Ht 5\' 8"  (1.727 m)   Wt (!) 152.9 kg (337 lb)   LMP 01/16/2017 (Exact Date)   SpO2 98%   BMI 51.24 kg/m   Na 135, K, 3.8, Cr 0.8, WBC 6.3, Hgb 11.9, Platelets 384K Trop negative CTA shows trace new filling defects from 8 days ago, EDP discussed with radiology  Patient has been adherent to Eliquis.  Has been in ER weekly since diagnosis 6 weeks ago with chest pain.   First diagnosed early June, started on Xarelto and dsicharged after 1 day. Returned later that week with new PE on CTA, started on heparin, Onc consulted, rec'd Eliquis which she has been taking since then.  Given habitus and twice daily NOACs, wonder if she should be on warfarin.  Rec'd to d/w her primary team, ie Oncology, re: disposition and they have asked for heparin bridge to warfarin, which the hospitalist service is happy to provide.

## 2017-02-09 NOTE — ED Notes (Signed)
Pt states trying to find a ride d/t receiving narcotics

## 2017-02-09 NOTE — Telephone Encounter (Signed)
Hayley CornfieldStephanie I spoke to our referral tech here concerning respiratory therapy with note in the system, but she does not do referrals for respiratory therapy. Maybe if a order can be faxed to pulmonary for respiratory therapy

## 2017-02-09 NOTE — ED Provider Notes (Signed)
[facility] Provider Note   CSN: [id number] Arrival date & time: 02/09/17  1958  By signing my name below, I, Vista Mink, attest that this documentation has been prepared under the direction and in the presence of Taneal Sonntag PA-C  Electronically Signed: Vista Mink, ED Scribe. 02/09/17. 10:10 PM.  History   Chief Complaint Chief Complaint  Patient presents with  . Chest Pain   HPI HPI Comments: Jayden Rudge is a 31 y.o. female with Hx of PE, Lupus, who presents to the Emergency Department complaining of chest pain, intermittent shortness of breath with associated episodes of dizziness, lightheadedness that started this morning upon waking.   Pt was recently dx with a PE on 12/26/2016. She was placed on Xarelto folllowing this. She was seen in the ED following this on 12/29/16 for worsening shortness of breath and was diagnosed with a worsening clot burden by CT. She was switched from Xarelto to Lovenox BID. During follow up with her PCP on 01/05/17 she was told to return to the ED for worsening shortness of breath. She received another CT which showed no worsening clot burden. She was switched from Lovenox to Eliquis which she had been taking since.  Pt Hx also significant for Lupus, dx in 2012. She notes that her PCP told her that her PE could have been caused by her Lupus. 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 No Hx of chest pain related to it. She reports that she was also recently diagnosed with antiphospholipid syndrome.  Last night pt slept with 5 pillows because her symptoms were exacerbated when laying flat; she normally uses 2 pillows. Pt's symptoms started with stabbing left sided chest pain/pressure that intermittently radiates down her left arm and up the left side of her neck. Pt reports shortness of breath and nausea that started this morning. Her shortness of breath is exacerbated while walking. She also notes an intermittent dizzy/lightheaded  sensation today. She describes this sensation as "room-spinning".  While she was driving today she also had an episode of blurry vision which forced her to pull over to the side of the road but states that the blurry vision subsided after 5 minutes and her vision returned to normal. She had a similar episode of blurry vision last week but this is the first time that it reoccurred. Pt further reports left lower extremity swelling with mild pain, onset 8 days ago. The swelling and pain have gradually worsened since they started last week. She reports pain when raising her left leg. No recent fall or injury. She denies any cough.  Followed by: Dr. Pamelia Hoit  The history is provided by the patient. No language interpreter was used.    Past Medical History:  Diagnosis Date  . Anxiety    doing good now  . Asthma   . Headache(784.0)   . HSV infection   . Infection    UTI  . Lupus    dx age 31  . Pulmonary embolism (HCC) 06/4/82011  . 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 Antiphospholipid antibody syndrome (HCC) 02/10/2017  . Pulmonary embolus (HCC) 02/09/2017  . Pulmonary embolism (HCC) 12/26/2016  . SOB (shortness of breath)   . Vaginal delivery--VE assist 10/25/2013  . 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 Obesity-BMI 47 05/09/2013  . Hirsutism 10/02/2012  . STD (  female) 01/18/2012  . Lupus   . Asthma   . HSV infection     Past Surgical History:  Procedure Laterality Date  . MOUTH SURGERY     2 teeth removed- 1 wisdom and 1 in front of it  . TONSILLECTOMY AND ADENOIDECTOMY  1998  . WISDOM TOOTH EXTRACTION      OB History    Gravida Para Term Preterm AB Living   2 1 1   1 1    SAB TAB Ectopic Multiple Live Births   1       1       Home Medications    Prior to Admission medications   Medication Sig Start Date End Date Taking?  Authorizing Provider  acetaminophen (TYLENOL) 500 MG tablet Take 1,000 mg by mouth every 6 (six) hours as needed for headache (pain).     [provider]  albuterol (PROVENTIL HFA;VENTOLIN HFA) 108 (90 Base) MCG/ACT inhaler Inhale 2 puffs into the lungs every 6 (six) hours as needed for wheezing or shortness of breath.    [provider]  albuterol (PROVENTIL) (2.5 MG/3ML) 0.083% nebulizer solution Take 2.5 mg by nebulization every 6 (six) hours as needed for wheezing or shortness of breath.    [provider]  hydroxychloroquine (PLAQUENIL) 200 MG tablet Take 200 mg by mouth daily.    [provider]  methylPREDNISolone (MEDROL) 4 MG tablet Take 4 mg by mouth daily.    [provider]  Multiple Vitamin (MULTIVITAMIN WITH MINERALS) TABS tablet Take 1 tablet by mouth daily.    [provider]  oxyCODONE-acetaminophen (PERCOCET/ROXICET) 5-325 MG tablet Take 1 tablet by mouth every 6 (six) hours as needed for moderate pain. 01/02/17   Regalado, Belkys A, MD  senna (SENOKOT) 8.6 MG TABS tablet Take 1 tablet (8.6 mg total) by mouth 2 (two) times daily. 01/02/17   Regalado, Prentiss Bells, MD    Family History Family History  Problem Relation Age of Onset  . Diabetes Maternal Aunt   . Cancer Maternal Grandmother 72       COLON CA  . Diabetes Maternal Grandmother   . Hypertension Maternal Grandfather   . Cancer Maternal Grandfather        prostate  . Asthma Mother   . Asthma Brother   . Cancer Paternal Grandmother        breast  . Lupus Paternal Grandmother   . Anesthesia problems Neg Hx   . Hypotension Neg Hx   . Malignant hyperthermia Neg Hx   . Pseudochol deficiency Neg Hx     Social History Social History  Substance Use Topics  . Smoking status: Never Smoker  . Smokeless tobacco: Never Used  . Alcohol use No     Allergies   Bactrim; Prednisone; Nsaids; Other; Pineapple; Procardia [nifedipine]; and Vicodin  [hydrocodone-acetaminophen]   Review of Systems Review of Systems  Constitutional: Negative for activity change, chills and fever.  Respiratory: Positive for shortness of breath. Negative for cough.   Cardiovascular: Positive for chest pain and leg swelling (left).  Gastrointestinal: Negative for abdominal pain.  Musculoskeletal: Negative for back pain and gait problem.  Skin: Negative for rash.  Neurological: Positive for dizziness and light-headedness.   Physical Exam Updated Vital Signs BP (!) 144/95 (BP Location: Right Arm)   Pulse 78   Temp 97.8 F (36.6 C) (Oral)   Resp 14   Ht 5\' 8"  (1.727 m)   Wt (!) 152 kg (335 lb 3.2 oz)  LMP 01/16/2017 (Exact Date)   SpO2 99%   BMI 50.97 kg/m   Physical Exam  Constitutional: No distress.  Morbidly obese.  HENT:  Head: Normocephalic.  Eyes: Conjunctivae are normal.  Neck: Neck supple.  Cardiovascular: Regular rhythm and normal heart sounds.  Tachycardia present.  Exam reveals no gallop and no friction rub.   No murmur heard. Pulses:      Radial pulses are 2+ on the right side, and 2+ on the left side.       Dorsalis pedis pulses are 2+ on the right side, and 2+ on the left side.       Posterior tibial pulses are 2+ on the right side, and 2+ on the left side.  Pulmonary/Chest: Effort normal. No respiratory distress. She has no wheezes. She has no rales. She exhibits no tenderness.  Abdominal: Soft. She exhibits no distension.  Protuberant abdomen  Neurological: She is alert.  Skin: Skin is warm. No rash noted.  Psychiatric: Her behavior is normal.  Nursing note and vitals reviewed.  ED Treatments / Results  DIAGNOSTIC STUDIES: Oxygen Saturation is 100% on RA, normal by my interpretation.  COORDINATION OF CARE: 8:52 PM-Discussed treatment plan with pt at bedside and pt agreed to plan.   Labs (all labs ordered are listed, but only abnormal results are displayed) Labs Reviewed  BASIC METABOLIC PANEL - Abnormal; Notable  for the following:       Result Value   Glucose, Bld 115 (*)    All other components within normal limits  CBC - Abnormal; Notable for the following:    Hemoglobin 11.9 (*)    HCT 35.9 (*)    All other components within normal limits  TROPONIN I  APTT  HEPARIN LEVEL (UNFRACTIONATED)  PROTIME-INR    EKG  EKG Interpretation  Date/Time:  Friday February 09 2017 20:11:20 EDT Ventricular Rate:  105 PR Interval:  138 QRS Duration: 86 QT Interval:  374 QTC Calculation: 494 R Axis:   65 Text Interpretation:  Sinus tachycardia Otherwise normal ECG No significant change since last tracing except rate faster Confirmed by Jerelyn ScottLinker, Martha [phone]([id number]) on 02/09/2017 8:12:40 PM       Radiology Ct Angio Chest Pe W And/or Wo Contrast  Result Date: 02/09/2017 CLINICAL DATA:  Chest pain starting today. Patient being treated for pulmonary embolus diagnosed in June. EXAM: CT ANGIOGRAPHY CHEST WITH CONTRAST TECHNIQUE: Multidetector CT imaging of the chest was performed using the standard protocol during bolus administration of intravenous contrast. Multiplanar CT image reconstructions and MIPs were obtained to evaluate the vascular anatomy. CONTRAST:  100 cc Isovue 370 IV COMPARISON:  02/01/2017 FINDINGS: Cardiovascular: The study is slightly limited for the evaluation of central pulmonary embolism due to patient body habitus and timing of contrast bolus. Tiny strand-like nonocclusive filling defects to the left lower lobe consistent with small pulmonary emboli are noted. No heart strain. Great vessels are normal in course and caliber. Normal heart size. No significant pericardial fluid/thickening. Mediastinum/Nodes: No discrete thyroid nodules. Unremarkable esophagus. No pathologically enlarged axillary, mediastinal or hilar lymph nodes. Lungs/Pleura: No pneumothorax. No pleural effusion. No acute pulmonary consolidation or dominant mass. Upper abdomen: Unremarkable. Musculoskeletal:  No aggressive appearing focal  osseous lesions. Review of the MIP images confirms the above findings. IMPRESSION: Tiny strand-like filling defects within left lower lobe pulmonary pulmonary arteries consistent with nonocclusive pulmonary emboli. No heart strain. Critical Value/emergent results were called by telephone at the time of interpretation on 02/09/2017 at 9:55 pm to [facility]ve[facility] Mcnear , who verbally acknowledged these results. Electronically Signed   By: Tollie Eth M.D.   On: 02/09/2017 21:55    Procedures Procedures (including critical care time)  .CRITICAL CARE Performed by: Barkley Boards Total critical care time: 30 minutes Critical care time was exclusive of separately billable procedures and treating other patients. Critical care was necessary to treat or prevent imminent or life-threatening deterioration. Critical care was time spent personally by me on the following activities: development of treatment plan with patient and/or surrogate as well as nursing, discussions with consultants, evaluation of patient's response to treatment, examination of patient, obtaining history from patient or surrogate, ordering and performing treatments and interventions, ordering and review of laboratory studies, ordering and review of radiographic studies, pulse oximetry and re-evaluation of patient's condition.  Medications Ordered in ED Medications  heparin ADULT infusion 100 units/mL (25000 units/251mL sodium chloride 0.45%) (1,600 Units/hr Intravenous Transfusing/Transfer 02/09/17 2325)  hydroxychloroquine (PLAQUENIL) tablet 200 mg (not administered)  methylPREDNISolone (MEDROL) tablet 4 mg (not administered)  acetaminophen (TYLENOL) tablet 1,000 mg (not administered)  oxyCODONE-acetaminophen (PERCOCET/ROXICET) 5-325 MG per tablet 1 tablet (1 tablet Oral Given 02/10/17 0241)  senna (SENOKOT) tablet 8.6 mg (not administered)  albuterol (PROVENTIL) (2.5 MG/3ML) 0.083% nebulizer solution 2.5 mg (not administered)  morphine 4 MG/ML  injection 4 mg (4 mg Intravenous Given 02/09/17 2137)  ondansetron (ZOFRAN) injection 4 mg (4 mg Intravenous Given 02/09/17 2138)  iopamidol (ISOVUE-370) 76 % injection 100 mL (100 mLs Intravenous Contrast Given 02/09/17 2129)  heparin bolus via infusion 5,000 Units (5,000 Units Intravenous Bolus from Bag 02/09/17 2221)     Initial Impression / Assessment and Plan / ED Course  I have reviewed the triage vital signs and the nursing notes.  Pertinent labs & imaging results that were available during my care of the patient were reviewed by me and considered in my medical decision making (see chart for details).     Morbidly obese 31 year old patient with a history of antiphospholipid syndrome and lupus and previous PEs x2 on Eliquis. EKG with sinus tachycardia. Troponin negative. Labs are unremarkable. CT angiogram of the chest demonstrating tiny filling defects in the left lower lobe. I spoke with the radiologist regarding these results versus most recent CT on 7/12, 6/15, 6/08, and 6/05,and he states that the findings are limited by the patient's body habitus, but the filling deficits do appear consistent with nonocclusive pulmonary emboli. No heart strain. Heparin initiated per pharmacy in the ED. Discussed the patient with Dr. Karma Ganja, attending physician. Consulted Dr. Maryfrances Bunnell, hospitalist, for admission who recommended consulting oncology for discussing long-term plan options and whether this is long-term options included hospitalization given the patient's questionable CT and numerous visits over the last month and a half for similar symptoms. Spoke with Dr. Myna Hidalgo, oncologist, who recommended inpatient treatment with heparin and bridging to Coumadin given patient's questionable CT scan and recent history of recurrent PEs. He recommended continued outpatient follow-up with Dr. Pamelia Hoit, but stated the patient may need outpatient follow-up at [facility] in the distant future. The patient appears  reasonably stabilized for admission considering the current resources, flow, and capabilities available in the ED at this time, and I doubt any other [facility] requiring further screening and/or treatment in the ED prior to admission.  Final Clinical Impressions(s) / ED Diagnoses   Final diagnoses:  Acute septic pulmonary embolism without acute cor pulmonale (HCC)    New Prescriptions Current Discharge Medication List    I personally performed the services  described in this documentation, which was scribed in my presence. The recorded information has been reviewed and is accurate.    Barkley Boards, PA-C 02/10/17 0248    Barkley Boards, PA-C 02/10/17 Albina Billet, MD 02/10/17 [phone]

## 2017-02-09 NOTE — ED Notes (Signed)
Patient transported to CT 

## 2017-02-10 ENCOUNTER — Encounter (HOSPITAL_COMMUNITY): Payer: Self-pay | Admitting: Family Medicine

## 2017-02-10 DIAGNOSIS — L930 Discoid lupus erythematosus: Secondary | ICD-10-CM

## 2017-02-10 DIAGNOSIS — I2699 Other pulmonary embolism without acute cor pulmonale: Principal | ICD-10-CM

## 2017-02-10 DIAGNOSIS — D6861 Antiphospholipid syndrome: Secondary | ICD-10-CM | POA: Diagnosis present

## 2017-02-10 DIAGNOSIS — R760 Raised antibody titer: Secondary | ICD-10-CM

## 2017-02-10 DIAGNOSIS — J4520 Mild intermittent asthma, uncomplicated: Secondary | ICD-10-CM

## 2017-02-10 DIAGNOSIS — M329 Systemic lupus erythematosus, unspecified: Secondary | ICD-10-CM

## 2017-02-10 DIAGNOSIS — I2601 Septic pulmonary embolism with acute cor pulmonale: Secondary | ICD-10-CM

## 2017-02-10 LAB — PATHOLOGIST SMEAR REVIEW
Basophils Absolute: 0 10*3/uL (ref 0.0–0.2)
Basos: 1 %
EOS (ABSOLUTE): 0.2 10*3/uL (ref 0.0–0.4)
Eos: 4 %
HEMATOCRIT: 38.9 % (ref 34.0–46.6)
HEMOGLOBIN: 12.9 g/dL (ref 11.1–15.9)
Immature Grans (Abs): 0 10*3/uL (ref 0.0–0.1)
Immature Granulocytes: 0 %
LYMPHS ABS: 1.8 10*3/uL (ref 0.7–3.1)
Lymphs: 43 %
MCH: 27.4 pg (ref 26.6–33.0)
MCHC: 33.2 g/dL (ref 31.5–35.7)
MCV: 83 fL (ref 79–97)
MONOS ABS: 0.3 10*3/uL (ref 0.1–0.9)
Monocytes: 7 %
NEUTROS ABS: 1.9 10*3/uL (ref 1.4–7.0)
Neutrophils: 45 %
Platelets: 354 10*3/uL (ref 150–379)
RBC: 4.7 x10E6/uL (ref 3.77–5.28)
RDW: 15.3 % (ref 12.3–15.4)
WBC: 4.2 10*3/uL (ref 3.4–10.8)

## 2017-02-10 LAB — PROTIME-INR
INR: 1.05
PROTHROMBIN TIME: 13.8 s (ref 11.4–15.2)

## 2017-02-10 LAB — HEPARIN LEVEL (UNFRACTIONATED)
HEPARIN UNFRACTIONATED: 0.7 [IU]/mL (ref 0.30–0.70)
HEPARIN UNFRACTIONATED: 0.5 [IU]/mL (ref 0.30–0.70)

## 2017-02-10 LAB — APTT: APTT: 100 s — AB (ref 24–36)

## 2017-02-10 MED ORDER — COUMADIN BOOK
Freq: Once | Status: AC
  Administered 2017-02-10: 14:00:00
  Filled 2017-02-10: qty 1

## 2017-02-10 MED ORDER — ACETAMINOPHEN 500 MG PO TABS
1000.0000 mg | ORAL_TABLET | Freq: Four times a day (QID) | ORAL | Status: DC | PRN
  Administered 2017-02-11: 1000 mg via ORAL
  Filled 2017-02-10: qty 2

## 2017-02-10 MED ORDER — OXYCODONE-ACETAMINOPHEN 5-325 MG PO TABS
1.0000 | ORAL_TABLET | Freq: Four times a day (QID) | ORAL | Status: DC | PRN
  Administered 2017-02-10: 2 via ORAL
  Administered 2017-02-11 (×2): 1 via ORAL
  Administered 2017-02-11 – 2017-02-19 (×20): 2 via ORAL
  Filled 2017-02-10 (×22): qty 2
  Filled 2017-02-10: qty 1

## 2017-02-10 MED ORDER — SENNA 8.6 MG PO TABS
1.0000 | ORAL_TABLET | Freq: Two times a day (BID) | ORAL | Status: DC
  Administered 2017-02-10 – 2017-02-19 (×16): 8.6 mg via ORAL
  Filled 2017-02-10 (×18): qty 1

## 2017-02-10 MED ORDER — WARFARIN - PHARMACIST DOSING INPATIENT
Freq: Every day | Status: DC
  Administered 2017-02-14 – 2017-02-17 (×3)
  Administered 2017-02-18: 17.5

## 2017-02-10 MED ORDER — ALBUTEROL SULFATE (2.5 MG/3ML) 0.083% IN NEBU: 2.5000 mg | INHALATION_SOLUTION | Freq: Four times a day (QID) | RESPIRATORY_TRACT | Status: DC | PRN

## 2017-02-10 MED ORDER — WARFARIN SODIUM 10 MG PO TABS
10.0000 mg | ORAL_TABLET | Freq: Once | ORAL | Status: AC
  Administered 2017-02-10: 10 mg via ORAL
  Filled 2017-02-10: qty 1

## 2017-02-10 MED ORDER — HYDROXYCHLOROQUINE SULFATE 200 MG PO TABS
200.0000 mg | ORAL_TABLET | Freq: Every day | ORAL | Status: DC
  Administered 2017-02-10 – 2017-02-19 (×10): 200 mg via ORAL
  Filled 2017-02-10 (×10): qty 1

## 2017-02-10 MED ORDER — METHYLPREDNISOLONE 4 MG PO TABS
4.0000 mg | ORAL_TABLET | Freq: Every day | ORAL | Status: DC
  Administered 2017-02-10 – 2017-02-19 (×10): 4 mg via ORAL
  Filled 2017-02-10 (×11): qty 1

## 2017-02-10 MED ORDER — OXYCODONE-ACETAMINOPHEN 5-325 MG PO TABS
1.0000 | ORAL_TABLET | Freq: Four times a day (QID) | ORAL | Status: DC | PRN
  Administered 2017-02-10 (×2): 1 via ORAL
  Filled 2017-02-10 (×3): qty 1

## 2017-02-10 NOTE — Plan of Care (Signed)
Problem: Pain Managment: Goal: General experience of comfort will improve Outcome: Progressing Patient complained of chest pain relieved by PRN pain medication.

## 2017-02-10 NOTE — Progress Notes (Signed)
Purvis Sheffieldndrea Foster is a 31 y.o. female with a past medical history significant for SLE with APLA on Eliquis, morbid obesity, and mild asthma who presents with chest pain, was found to have new PE being on eliquis. She failed treatment with xarelto and eliquis, and developed PE'S, so ultimately, she was started on IV heparin, with intention to start coumadin starting tonight. Oncology consulted overnight, recommended referral to tertiary care for anti coagulation specialist on discharge.  She was admitted earlier this am, and please see Dr Sheryn Bisonanford's note in detail for h&p.    PLAN:  1. Continue with IV heparin and coumadin.  2. Pain control.    Kathlen Modyvijaya Arohi Salvatierra,  [phone]

## 2017-02-10 NOTE — H&P (Addendum)
History and Physical  Patient Name: Hayley Webb     WUJ:[id number]    DOB: 07-20-1986    DOA: 02/09/2017 PCP: Garnetta Buddy, PA  Patient coming from: Home  -->  [facility]  Chief Complaint: Chest pain      HPI: Hayley Webb is a 31 y.o. 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  The patient was in her usual state of health until about 6 weeks ago when she developed severe and progressive SOB and chest pain, found to have a PE.  Was admitted to the hospital overnight, started on Xarelto.  She was readmitted 4 days later with CTA chest evidence of new clot burden, treated with Lovenox injections, evaluated by Heme-Onc, ultimately discharged on Eliquis, which she has been taking since then.  She has been seen at [facility] or the ER 5 times since then for chest pain, had three more CT scans, including one week ago, which was negative.  Her pain had resolved until this morning, she woke with left sided sharp moderate-to-severe pleuritic chest pain, radiating down her arm and associated with feeling SOB, dizziness and lightheadedness.  This persisted through the day at work so after work she came to the ER.  ED course: -Afebrile, heart rate 78, respirations 14, blood pressure 144/95, pulse ox 99% on room air -Na 135, K, 3.8, Cr 0.8, WBC 6.3, Hgb 11.9, Platelets 384K -Troponin negative -CTA showed trace new filling defects from 8 days ago, EDP discussed with radiology who felt this was a real finding -Case was discussed with Dr. Myna Hidalgo from Hematology who felt the pateint should eb bridged to warfarin in the inpatient setting and so [facility] were asked to admit    ROS: Review of Systems  Respiratory: Positive for shortness of breath.   Cardiovascular: Positive for chest pain.  Neurological: Positive for dizziness.  All other systems reviewed and are negative.         Past Medical History:    Diagnosis Date  . Anxiety    doing good now  . Asthma   . Headache(784.0)   . HSV infection   . Infection    UTI  . Lupus    dx age 60  . Pulmonary embolism (HCC) 06/4/82011  . 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 MOUTH SURGERY     2 teeth removed- 1 wisdom and 1 in front of it  . TONSILLECTOMY AND ADENOIDECTOMY  1998  . WISDOM TOOTH EXTRACTION      Social History: Patient lives with her three year old son.  The patient walks unassisted.  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  Nonsmoker.  From [facility].  Allergies  Allergen Reactions  . Bactrim Anaphylaxis and Hives  . Prednisone Anaphylaxis and Hives    Has been on Dexamethasone without issue.  . Nsaids Other (See Comments)    Has Lupus, reccommended to avoid NSAIDs  . Other Itching    Shrimp:throat itching "can eat other shellfish"  . Pineapple Itching  . Procardia [Nifedipine] Swelling    Swelling of tongue  . Vicodin [Hydrocodone-Acetaminophen] Hives and Swelling    Can take Percocet without difficulty    Family history: family history includes Asthma in her brother and mother; Cancer in her maternal grandfather and paternal grandmother; Cancer (age of onset: 48) in her maternal  grandmother; Diabetes in her maternal aunt and maternal grandmother; Hypertension in her maternal grandfather; Lupus in her paternal grandmother.  Prior to Admission medications   Medication Sig Start Date End Date Taking? Authorizing Provider  acetaminophen (TYLENOL) 500 MG tablet Take 1,000 mg by mouth every 6 (six) hours as needed for headache (pain).     [provider]  albuterol (PROVENTIL HFA;VENTOLIN HFA) 108 (90 Base) MCG/ACT inhaler Inhale 2 puffs into the lungs every 6 (six) hours as needed for wheezing or shortness of breath.    [provider]  albuterol (PROVENTIL) (2.5 MG/3ML) 0.083% nebulizer solution Take 2.5 mg by  nebulization every 6 (six) hours as needed for wheezing or shortness of breath.    [provider]  hydroxychloroquine (PLAQUENIL) 200 MG tablet Take 200 mg by mouth daily.    [provider]  methylPREDNISolone (MEDROL) 4 MG tablet Take 4 mg by mouth daily.    [provider]  Multiple Vitamin (MULTIVITAMIN WITH MINERALS) TABS tablet Take 1 tablet by mouth daily.    [provider]  oxyCODONE-acetaminophen (PERCOCET/ROXICET) 5-325 MG tablet Take 1 tablet by mouth every 6 (six) hours as needed for moderate pain. 01/02/17   Regalado, Belkys A, MD  senna (SENOKOT) 8.6 MG TABS tablet Take 1 tablet (8.6 mg total) by mouth 2 (two) times daily. 01/02/17   Regalado, Prentiss BellsBelkys A, MD       Physical Exam: BP (!) 144/95 (BP Location: Right Arm)   Pulse 78   Temp 97.8 F (36.6 C) (Oral)   Resp 14   Ht 5\' 8"  (1.727 m)   Wt (!) 152 kg (335 lb 3.2 oz)   LMP 01/16/2017 (Exact Date)   SpO2 99%   BMI 50.97 kg/m  General appearance: Well-developed, obese adult female, alert and in no acute distress.   Eyes: Anicteric, conjunctiva pink, lids and lashes normal. PERRL.    ENT: No nasal deformity, discharge, epistaxis.  Hearing normal. OP moist without lesions.   Neck: No neck masses.  Trachea midline.  No thyromegaly/tenderness. Lymph: No cervical or supraclavicular lymphadenopathy. Skin: Warm and dry.  No jaundice.  No suspicious rashes or lesions. Cardiac: RRR, nl S1-S2, no murmurs appreciated.  Capillary refill is brisk.  JVP not visible.  No LE edema.  Radial and DP pulses 2+ and symmetric. Respiratory: Normal respiratory rate and rhythm.  CTAB without rales or wheezes. Abdomen: Abdomen soft.  No TTP. No ascites, distension, hepatosplenomegaly.   MSK: No deformities or effusions.  No cyanosis or clubbing. Neuro: Cranial nerves normal. Speech is fluent.  Muscle strength normal.    Psych: Sensorium intact and responding to questions, attention normal.  Behavior  appropriate.  Affect pleasant.  Judgment and insight appear normal.     Labs on Admission:  I have personally reviewed following labs and imaging studies: CBC:  Recent Labs Lab 02/09/17 2026  WBC 6.3  HGB 11.9*  HCT 35.9*  MCV 83.1  PLT 384   Basic Metabolic Panel:  Recent Labs Lab 02/09/17 2026  NA 135  K 3.8  CL 101  CO2 26  GLUCOSE 115*  BUN 15  CREATININE 0.80  CALCIUM 9.3   GFR: Estimated Creatinine Clearance: 162.3 mL/min (by C-G formula based on SCr of 0.8 mg/dL). Cardiac Enzymes:  Recent Labs Lab 02/09/17 2026  TROPONINI <0.03          Radiological Exams on Admission: Personally reviewed CTA report: Ct Angio Chest Pe W And/or Wo Contrast  Result Date: 02/09/2017  CLINICAL DATA:  Chest pain starting today. Patient being treated for pulmonary embolus diagnosed in June. EXAM: CT ANGIOGRAPHY CHEST WITH CONTRAST TECHNIQUE: Multidetector CT imaging of the chest was performed using the standard protocol during bolus administration of intravenous contrast. Multiplanar CT image reconstructions and MIPs were obtained to evaluate the vascular anatomy. CONTRAST:  100 cc Isovue 370 IV COMPARISON:  02/01/2017 FINDINGS: Cardiovascular: The study is slightly limited for the evaluation of central pulmonary embolism due to patient body habitus and timing of contrast bolus. Tiny strand-like nonocclusive filling defects to the left lower lobe consistent with small pulmonary emboli are noted. No heart strain. Great vessels are normal in course and caliber. Normal heart size. No significant pericardial fluid/thickening. Mediastinum/Nodes: No discrete thyroid nodules. Unremarkable esophagus. No pathologically enlarged axillary, mediastinal or hilar lymph nodes. Lungs/Pleura: No pneumothorax. No pleural effusion. No acute pulmonary consolidation or dominant mass. Upper abdomen: Unremarkable. Musculoskeletal:  No aggressive appearing focal osseous lesions. Review of the MIP images  confirms the above findings. IMPRESSION: Tiny strand-like filling defects within left lower lobe pulmonary pulmonary arteries consistent with nonocclusive pulmonary emboli. No heart strain. Critical Value/emergent results were called by telephone at the time of interpretation on 02/09/2017 at 9:55 pm to [facility] , who verbally acknowledged these results. Electronically Signed   By: Tollie Eth M.D.   On: 02/09/2017 21:55    EKG: Independently reviewed. Rate 105, sinus tachycardia without ST changes.  Echocardiogram June 2018: Report reviewed EF 60-65% No significant disease          Assessment/Plan  1. New nonocclusive pulmonary embolism:  Feel this should be considered a treatment failure.   -Stop Eliquis -Start warfarin -Bridge with heparin gtt -Consult to Hematology, who felt patient may need referral to tertiary care center for anticoagulation specialist  2. Lupus:  Inactive -Continue Plaquenil and Medrol  3. Asthma:  Inactive -Albuterol PRN          DVT prophylaxis: N/A  Code Status: FULL  Family Communication: None presetn  Disposition Plan: Anticipate IV heparin until warfarin therapeutic, then discharge to home. Consults called: Hematology spoke with EDP over phone Admission status: INPATIENT         Medical decision making: Patient seen at 1:20AM on 02/10/2017.  The patient was discussed with Frederik Pear, PA-C.  What exists of the patient's chart was reviewed in depth and summarized above.  Clinical condition: stable.        Alberteen Sam [facility] Pager [phone]      At the time of admission, it appears that the appropriate admission status for this patient is INPATIENT. This is judged to be reasonable and necessary in order to provide the required intensity of service to ensure the patient's safety given the presenting symptoms, physical exam findings, and initial radiographic and laboratory data in the context of  their chronic comorbidities.  Together, these circumstances are felt to place her at high risk for further clinical deterioration threatening life, limb, or organ.     I certify that at the point of admission it is my clinical judgment that the patient will require inpatient hospital care spanning beyond 2 midnights from the point of admission and that early discharge would result in unnecessary risk of decompensation and readmission or threat to life, limb or bodily function.  UIn particular, because of her weight, outpatient lovenox is not a reasonable option (and has previously failed) limiting [place] to heparin bridging in the inpatient setting.

## 2017-02-10 NOTE — Progress Notes (Signed)
ANTICOAGULATION CONSULT NOTE - Follow Up Consult  Pharmacy Consult for heparin/warfarin Indication: pulmonary embolus  Allergies  Allergen Reactions  . Bactrim Anaphylaxis and Hives  . Prednisone Anaphylaxis and Hives    Has been on Dexamethasone without issue.  . Nsaids Other (See Comments)    Has Lupus, reccommended to avoid NSAIDs  . Other Itching    Shrimp:throat itching "can eat other shellfish"  . Pineapple Itching  . Procardia [Nifedipine] Swelling    Swelling of tongue  . Vicodin [Hydrocodone-Acetaminophen] Hives and Swelling    Can take Percocet without difficulty    Patient Measurements: Height: 5\' 8"  (172.7 cm) Weight: (!) 335 lb 3.2 oz (152 kg) IBW/kg (Calculated) : 63.9  Vital Signs: BP: 123/76 (07/21 1418)  Labs:  Recent Labs  02/09/17 2026 02/10/17 0536 02/10/17 1337  HGB 11.9*  --   --   HCT 35.9*  --   --   PLT 384  --   --   APTT  --  100*  --   LABPROT  --  13.8  --   INR  --  1.05  --   HEPARINUNFRC  --  0.70 0.50  CREATININE 0.80  --   --   TROPONINI <0.03  --   --     Estimated Creatinine Clearance: 162.3 mL/min (by C-G formula based on SCr of 0.8 mg/dL).  Assessment: 31 y/o F was being treated with Apixaban 5 mg BID for PE that was diagnosed on 12/30/2016. She was initially on Lovenox for several weeks then switched to Apixaban after hematology consult. [facility] and "knot in foot". CT Angio positive for small PE (unsure if acute or residual from PE in June). Last dose of apixaban was ~7/19, on heparin bridge with first dose of concomitant warfarin tonight 7/21. CBC stable/ Plt WNL. No bleeding noted.  -1337 heparin level: therapeutic at 0.5   -baseline INR: 1.05  Goal of Therapy:  Heparin level 0.3-0.7 units/ml INR: 2-3 Monitor platelets by anticoagulation protocol: Yes   Plan:  Continue heparin drip at 1,600 units/hour Warfarin 10 mg PO x1 tonight Daily heparin level/INR Monitor CBC Monitor bleeding    Hershal Coriaiana L Chelesa Weingartner, PharmD, MS PGY1 Pharmacy Resident 02/10/2017,2:46 PM

## 2017-02-10 NOTE — Consult Note (Signed)
Referral MD  Reason for Referral: Recurrent pulmonary embolism; positive lupus anticoagulant; systemic lupus erythematosus   Chief Complaint  Patient presents with  . Chest Pain  : Add another blood clot in my lung.  HPI: Hayley Webb is a very charming 31 year old African-American female. She has a history of lupus. Other issues diagnosed with this about 6 years ago. She is on Plaquenil.  She had a pulmonary embolism a couple months ago. Prior to this, she had had no history of thromboembolic events. She has a 30-year-old son. She did have a miscarriage prior to him.  She had no problems with his pregnancy.  She has never had thromboembolism in her legs.  She had a pulmonary embolism back in early June. She was placed on Lovenox. She had a positive lupus anticoagulant assay. I don't see were any anti-Cardioliopin antibody studies were done.  Of note, she is a very difficult patient to get blood from. Her veins are very difficult to access.  She had a good birthday earlier this week.  Yesterday, she began to have chest wall pain. She has some shortness of breath. She had no diaphoresis. She had no cough. There is no hemoptysis.  She was placed on ELIQUIS about 3 weeks ago.  She said that she developed hematomas under the skin from her Lovenox injection.  She had a CT angiogram done. The radiologist felt that there was a left lower lobe pulmonary embolism. This was nonocclusive.  She now is admitted. She is on heparin.  Her labs show her white cell count be 6.3. Hemoglobin 12. Platelet count 384,000. Her potassium is 3.8. Creatinine 0.8.  She has had no bleeding. She may be a little bit constipated.  Her last echocardiogram was done about a month or so ago. She had an ejection fraction of 60-65%. There is no valvular issues.  She does not smoke. She does not have diabetes. Her wrap there is no family history of thromboembolic disease.  She's had no leg pain. She's had no change  in bowel or bladder habits.  She has had no issues with her monthly cycles.  She has a very strong faith. She wanted me to pray with her. As such, I had a good prayer with her. Thankfully, a couple of the lab techs were able to appraise with this.  I would have say that her performance status is ECOG 1.    Past Medical History:  Diagnosis Date  . Anxiety    doing good now  . Asthma   . Headache(784.0)   . HSV infection   . Infection    UTI  . Lupus    dx age 80  . Pulmonary embolism (HCC) 06/4/82011  . STD (sexually transmitted disease) 02/2009   POSITIVE GC  :  Past Surgical History:  Procedure Laterality Date  . MOUTH SURGERY     2 teeth removed- 1 wisdom and 1 in front of it  . TONSILLECTOMY AND ADENOIDECTOMY  1998  . WISDOM TOOTH EXTRACTION    :   Current Facility-Administered Medications:  .  acetaminophen (TYLENOL) tablet 1,000 mg, 1,000 mg, Oral, Q6H PRN, Danford, Christopher P, MD .  albuterol (PROVENTIL) (2.5 MG/3ML) 0.083% nebulizer solution 2.5 mg, 2.5 mg, Nebulization, Q6H PRN, Danford, Earl Lites, MD .  coumadin book, , Does not apply, Once, Stevphen Rochester, RPH .  heparin ADULT infusion 100 units/mL (25000 units/260mL sodium chloride 0.45%), 1,600 Units/hr, Intravenous, Continuous, Stevphen Rochester, RPH, Last Rate: 16 mL/hr  at 02/10/17 1131, 1,600 Units/hr at 02/10/17 1131 .  hydroxychloroquine (PLAQUENIL) tablet 200 mg, 200 mg, Oral, Daily, Danford, Earl Lites, MD, 200 mg at 02/10/17 0912 .  methylPREDNISolone (MEDROL) tablet 4 mg, 4 mg, Oral, Daily, Danford, Earl Lites, MD, 4 mg at 02/10/17 0956 .  oxyCODONE-acetaminophen (PERCOCET/ROXICET) 5-325 MG per tablet 1-2 tablet, 1-2 tablet, Oral, Q6H PRN, Kathlen Mody, MD .  senna (SENOKOT) tablet 8.6 mg, 1 tablet, Oral, BID, Danford, Earl Lites, MD, 8.6 mg at 02/10/17 0912 .  warfarin (COUMADIN) tablet 10 mg, 10 mg, Oral, ONCE-1800, Stevphen Rochester, RPH .  Warfarin - Pharmacist Dosing Inpatient, ,  Does not apply, q1800, Stevphen Rochester, RPH:  . coumadin book   Does not apply Once  . hydroxychloroquine  200 mg Oral Daily  . methylPREDNISolone  4 mg Oral Daily  . senna  1 tablet Oral BID  . warfarin  10 mg Oral ONCE-1800  . Warfarin - Pharmacist Dosing Inpatient   Does not apply q1800  :  Allergies  Allergen Reactions  . Bactrim Anaphylaxis and Hives  . Prednisone Anaphylaxis and Hives    Has been on Dexamethasone without issue.  . Nsaids Other (See Comments)    Has Lupus, reccommended to avoid NSAIDs  . Other Itching    Shrimp:throat itching "can eat other shellfish"  . Pineapple Itching  . Procardia [Nifedipine] Swelling    Swelling of tongue  . Vicodin [Hydrocodone-Acetaminophen] Hives and Swelling    Can take Percocet without difficulty  :  Family History  Problem Relation Age of Onset  . Diabetes Maternal Aunt   . Cancer Maternal Grandmother 72       COLON CA  . Diabetes Maternal Grandmother   . Hypertension Maternal Grandfather   . Cancer Maternal Grandfather        prostate  . Asthma Mother   . Asthma Brother   . Cancer Paternal Grandmother        breast  . Lupus Paternal Grandmother   . Anesthesia problems Neg Hx   . Hypotension Neg Hx   . Malignant hyperthermia Neg Hx   . Pseudochol deficiency Neg Hx   :  Social History   Social History  . Marital status: Single    Spouse name: N/A  . Number of children: N/A  . Years of education: N/A   Occupational History  . Not on file.   Social History Main Topics  . Smoking status: Never Smoker  . Smokeless tobacco: Never Used  . Alcohol use No  . Drug use: No  . Sexual activity: Yes    Partners: Male    Birth control/ protection: None   Other Topics Concern  . Not on file   Social History Narrative  . No narrative on file  :  Pertinent items are noted in HPI.  Exam: Patient Vitals for the past 24 hrs:  BP Temp Temp src Pulse Resp SpO2 Height Weight  02/10/17 0038 (!) 144/95 97.8 F  (36.6 C) Oral 78 14 99 % 5\' 8"  (1.727 m) (!) 335 lb 3.2 oz (152 kg)  02/09/17 2330 (!) 131/95 98.3 F (36.8 C) - 87 14 98 % - -  02/09/17 2230 (!) 122/93 - - 84 15 98 % - -  02/09/17 2200 128/79 - - 88 14 98 % - -  02/09/17 2115 - - - 90 20 100 % - -  02/09/17 2100 (!) 127/97 - - 95 (!) 28 100 % - -  02/09/17 2030 118/71 - - 94 (!) 21 97 % - -  02/09/17 2007 (!) 137/99 98.3 F (36.8 C) Oral (!) 103 20 100 % - -  02/09/17 2006 - - - - - - 5\' 8"  (1.727 m) (!) 337 lb (152.9 kg)    As above    Recent Labs  02/09/17 2026  WBC 6.3  HGB 11.9*  HCT 35.9*  PLT 384    Recent Labs  02/09/17 2026  NA 135  K 3.8  CL 101  CO2 26  GLUCOSE 115*  BUN 15  CREATININE 0.80  CALCIUM 9.3    Blood smear review:  None  Pathology: None     Assessment and Plan:  [name] is a very charming 31 year old African-American female. She is morbidly obese. She has a lupus anticoagulant. It is hard to say whether this is actually clinically significant.  I cannot say that she has the antiphospholipid antibody syndrome. She does have lupus.  I think her weight clearly is a factor in trying to figure out what is going to be an appropriate and effective anticoagulant.  Given the fact that she just had hematomas under the skin, I was sure this would be a reason not to give Lovenox a retry.  It looks like she is being started on Coumadin. This is reasonable. I think trying to dose this is going to be very difficult. I would suspect that she probably is going to need weekly Coumadin checks, if not more frequent.  Another oral anticoagulant could be considered.  Arixtra, which is subcutaneous, also could be considered.  If this Lupus anticoagulant is truly clinically significant, then the addition of aspirin might be indicated.  Again, she is very nice. She is very motivated. She is doing her best to try to help minimize any thromboembolism.  It might be reasonable to send [facility] the  [facility]. Dr.Stephan Moll or Dr. Kerry DoryAlice Ma are actually world famous with respect to coagulation issues. I believe this would be very worthwhile in the case of Hayley Webb.  We will follow along.  I appreciate everybody's help on 3W!!!   Christin BachPete Ennever, MD  Duwayne HeckIsaiah 41:10

## 2017-02-11 LAB — URINALYSIS, ROUTINE W REFLEX MICROSCOPIC
BILIRUBIN URINE: NEGATIVE
Glucose, UA: NEGATIVE mg/dL
KETONES UR: NEGATIVE mg/dL
Nitrite: NEGATIVE
PH: 6 (ref 5.0–8.0)
Protein, ur: 30 mg/dL — AB
Specific Gravity, Urine: 1.03 (ref 1.005–1.030)

## 2017-02-11 LAB — PROTIME-INR
INR: 1.07
Prothrombin Time: 13.9 seconds (ref 11.4–15.2)

## 2017-02-11 LAB — HEPARIN LEVEL (UNFRACTIONATED): HEPARIN UNFRACTIONATED: 0.57 [IU]/mL (ref 0.30–0.70)

## 2017-02-11 MED ORDER — PATIENT'S GUIDE TO USING COUMADIN BOOK
Freq: Once | Status: AC
  Filled 2017-02-11: qty 1

## 2017-02-11 MED ORDER — WARFARIN SODIUM 10 MG PO TABS
10.0000 mg | ORAL_TABLET | Freq: Once | ORAL | Status: AC
  Administered 2017-02-11: 10 mg via ORAL
  Filled 2017-02-11: qty 1

## 2017-02-11 MED ORDER — CEPHALEXIN 500 MG PO CAPS
500.0000 mg | ORAL_CAPSULE | Freq: Three times a day (TID) | ORAL | Status: DC
  Administered 2017-02-11 – 2017-02-14 (×10): 500 mg via ORAL
  Filled 2017-02-11 (×9): qty 1

## 2017-02-11 NOTE — Discharge Instructions (Signed)
Information on my medicine - Coumadin   (Warfarin)  This medication education was reviewed with me or my healthcare representative as part of my discharge preparation.  The pharmacist that spoke with me during my hospital stay was:  Hershal CoriaDiana L Priscille Shadduck, [facility]  Why was Coumadin prescribed for you? Coumadin was prescribed for you because you have a blood clot or a medical condition that can cause an increased risk of forming blood clots. Blood clots can cause serious health problems by blocking the flow of blood to the heart, lung, or brain. Coumadin can prevent harmful blood clots from forming. As a reminder your indication for Coumadin is:   Pulmonary Embolism Treatment  What test will check on my response to Coumadin? While on Coumadin (warfarin) you will need to have an INR test regularly to ensure that your dose is keeping you in the desired range. The INR (international normalized ratio) number is calculated from the result of the laboratory test called prothrombin time (PT).  If an INR APPOINTMENT HAS NOT ALREADY BEEN MADE FOR YOU please schedule an appointment to have this lab work done by your health care provider within 7 days. Your INR goal is usually a number between:  2 to 3 or your provider may give you a more narrow range like 2-2.5.  Ask your health care provider during an office visit what your goal INR is.  What  do you need to  know  About  COUMADIN? Take Coumadin (warfarin) exactly as prescribed by your healthcare provider about the same time each day.  DO NOT stop taking without talking to the doctor who prescribed the medication.  Stopping without other blood clot prevention medication to take the place of Coumadin may increase your risk of developing a new clot or stroke.  Get refills before you run out.  What do you do if you miss a dose? If you miss a dose, take it as soon as you remember on the same day then continue your regularly scheduled regimen the next day.  Do not take two  doses of Coumadin at the same time.  Important Safety Information A possible side effect of Coumadin (Warfarin) is an increased risk of bleeding. You should call your healthcare provider right away if you experience any of the following: ? Bleeding from an injury or your nose that does not stop. ? Unusual colored urine (red or dark brown) or unusual colored stools (red or black). ? Unusual bruising for unknown reasons. ? A serious fall or if you hit your head (even if there is no bleeding).  Some foods or medicines interact with Coumadin (warfarin) and might alter your response to warfarin. To help avoid this: ? Eat a balanced diet, maintaining a consistent amount of Vitamin K. ? Notify your provider about major diet changes you plan to make. ? Avoid alcohol or limit your intake to 1 drink for women and 2 drinks for men per day. (1 drink is 5 oz. wine, 12 oz. beer, or 1.5 oz. liquor.)  Make sure that ANY health care provider who prescribes medication for you knows that you are taking Coumadin (warfarin).  Also make sure the healthcare provider who is monitoring your Coumadin knows when you have started a new medication including herbals and non-prescription products.  Coumadin (Warfarin)  Major Drug Interactions  Increased Warfarin Effect Decreased Warfarin Effect  Alcohol (large quantities) Antibiotics (esp. Septra/Bactrim, Flagyl, Cipro) Amiodarone (Cordarone) Aspirin (ASA) Cimetidine (Tagamet) Megestrol (Megace) NSAIDs (ibuprofen, naproxen, etc.) Piroxicam (  Feldene) °Propafenone (Rythmol SR) °Propranolol (Inderal) °Isoniazid (INH) °Posaconazole (Noxafil) Barbiturates (Phenobarbital) °Carbamazepine (Tegretol) °Chlordiazepoxide (Librium) °Cholestyramine (Questran) °Griseofulvin °Oral Contraceptives °Rifampin °Sucralfate (Carafate) °Vitamin K  ° °Coumadin® (Warfarin) Major Herbal Interactions  °Increased Warfarin Effect Decreased Warfarin Effect  °Garlic °Ginseng °Ginkgo biloba Coenzyme  Q10 °Green tea °St. John’s wort   ° °Coumadin® (Warfarin) FOOD Interactions  °Eat a consistent number of servings per week of foods HIGH in Vitamin K °(1 serving = ½ cup)  °Collards (cooked, or boiled & drained) °Kale (cooked, or boiled & drained) °Mustard greens (cooked, or boiled & drained) °Parsley *serving size only = ¼ cup °Spinach (cooked, or boiled & drained) °Swiss chard (cooked, or boiled & drained) °Turnip greens (cooked, or boiled & drained)  °Eat a consistent number of servings per week of foods MEDIUM-HIGH in Vitamin K °(1 serving = 1 cup)  °Asparagus (cooked, or boiled & drained) °Broccoli (cooked, boiled & drained, or raw & chopped) °Brussel sprouts (cooked, or boiled & drained) *serving size only = ½ cup °Lettuce, raw (green leaf, endive, romaine) °Spinach, raw °Turnip greens, raw & chopped  ° °These websites have more information on Coumadin (warfarin):  www.coumadin.com; °www.ahrq.gov/consumer/coumadin.htm; ° ° °

## 2017-02-11 NOTE — Progress Notes (Signed)
ANTICOAGULATION CONSULT NOTE - Follow Up Consult  Pharmacy Consult for heparin/warfarin Indication: pulmonary embolus  Allergies  Allergen Reactions  . Bactrim Anaphylaxis and Hives  . Prednisone Anaphylaxis and Hives    Has been on Dexamethasone without issue.  . Nsaids Other (See Comments)    Has Lupus, reccommended to avoid NSAIDs  . Other Itching    Shrimp:throat itching "can eat other shellfish"  . Pineapple Itching  . Procardia [Nifedipine] Swelling    Swelling of tongue  . Vicodin [Hydrocodone-Acetaminophen] Hives and Swelling    Can take Percocet without difficulty    Patient Measurements: Height: 5\' 8"  (172.7 cm) Weight: (!) 332 lb 8 oz (150.8 kg) IBW/kg (Calculated) : 63.9  Vital Signs: Temp: 98 F (36.7 C) (07/22 0501) Temp Source: Oral (07/22 0501) BP: 115/62 (07/22 0501) Pulse Rate: 72 (07/22 0501)  Labs:  Recent Labs  02/09/17 2026 02/10/17 0536 02/10/17 1337 02/11/17 0336  HGB 11.9*  --   --   --   HCT 35.9*  --   --   --   PLT 384  --   --   --   APTT  --  100*  --   --   LABPROT  --  13.8  --  13.9  INR  --  1.05  --  1.07  HEPARINUNFRC  --  0.70 0.50 0.57  CREATININE 0.80  --   --   --   TROPONINI <0.03  --   --   --     Estimated Creatinine Clearance: 161.7 mL/min (by C-G formula based on SCr of 0.8 mg/dL).  Assessment: 31 y/o F with new PE on eliquis prior to admission. Now on heparin bridge and continuing to load with warfarin.   -Heparin level: therapeutic at 0.57   -INR: 1.07  INR is subtherapeutic and slightly up from baseline.   CBC stable/ Plt WNL. No bleeding noted.   Goal of Therapy:  Heparin level 0.3-0.7 units/ml INR: 2-3 Monitor platelets by anticoagulation protocol: Yes   Plan:  Continue heparin drip at 1,600 units/hour Warfarin 10 mg PO x1 tonight Daily heparin level/INR Monitor CBC Monitor bleeding   Hershal Coriaiana L Raymond, PharmD, MS PGY1 Pharmacy Resident 02/11/2017,10:14 AM

## 2017-02-11 NOTE — Progress Notes (Signed)
PROGRESS NOTE    Hayley Webb  EAV:[id number] DOB: Jan 13, 1986 DOA: 02/09/2017 PCP: Garnetta BuddyEnglish, Stephanie D, PA    Brief Narrative: Hayley Macadamiandrea Fosteris a 31 y.o.femalewith a past medical history significant for SLE with APLA on Eliquis, morbid obesity, and mild asthmawho presents with chest pain, was found to have new PE being on eliquis. She failed treatment with xarelto and eliquis, and developed PE'S, so ultimately, she was started on IV heparin and coumadin. Oncology consulted overnight, recommended referral to tertiary care for anti coagulation specialist on discharge.   Assessment & Plan:   Principal Problem:   Pulmonary embolus (HCC) Active Problems:   Asthma   Lupus   Antiphospholipid antibody syndrome (HCC)   Pulmonary embolus:  Started on IV heparin and coumadin.  INR still sub therapeutic.  Pt reports some foot swelling, new compared to admission associated with some tingling.  Venous duplex lower extremity ordered.  Echocardiogram pending.    Asthma, no wheezing heard.  Alb as needed.    H/o lupus and APLA Syndrome:  no signs of exacerbation.  Resume medrol and plaquenil.    Hematuria and abnormal UA:  Urine cultures sent and started heron keflex empirically.    Constipation:  On senna colace    DVT prophylaxis: heparin and coumadin.  Code Status: full code.  Family Communication: none at bedside. Spoke to her mom on the phone and updated her.  Disposition Plan: home when INR is therapeutic.   Consultants:   oncology   Procedures: NONE.    Antimicrobials: keflex added for UTI.    Subjective: Intermittent chest pain on deep breathing.   Objective: Vitals:   02/10/17 1418 02/10/17 2033 02/11/17 0501 02/11/17 1412  BP: 123/76 (!) 103/53 115/62 117/75  Pulse:  68 72 71  Resp: 16 16 18    Temp:  98.1 F (36.7 C) 98 F (36.7 C) 98.1 F (36.7 C)  TempSrc:  Oral Oral Oral  SpO2:  97% 98% 99%  Weight:   (!) 150.8 kg (332 lb 8 oz)   Height:         Intake/Output Summary (Last 24 hours) at 02/11/17 1641 Last data filed at 02/11/17 0913  Gross per 24 hour  Intake              120 ml  Output                0 ml  Net              120 ml   Filed Weights   02/09/17 2006 02/10/17 0038 02/11/17 0501  Weight: (!) 152.9 kg (337 lb) (!) 152 kg (335 lb 3.2 oz) (!) 150.8 kg (332 lb 8 oz)    Examination:  General exam: Appears calm and comfortable  Respiratory system: Clear to auscultation. Respiratory effort normal. Cardiovascular system: S1 & S2 heard, RRR. No JVD, murmurs, rubs, gallops or clicks. No pedal edema. Gastrointestinal system: Abdomen is nondistended, soft and nontender. No organomegaly or masses felt. Normal bowel sounds heard. Central nervous system: Alert and oriented. No focal neurological deficits. Extremities: Symmetric 5 x 5 power. Skin: No rashes, lesions or ulcers Psychiatry: Judgement and insight appear normal. Mood & affect appropriate.     Data Reviewed: I have personally reviewed following labs and imaging studies  CBC:  Recent Labs Lab 02/09/17 2026  WBC 6.3  HGB 11.9*  HCT 35.9*  MCV 83.1  PLT 384   Basic Metabolic Panel:  Recent Labs Lab 02/09/17 2026  NA  135  K 3.8  CL 101  CO2 26  GLUCOSE 115*  BUN 15  CREATININE 0.80  CALCIUM 9.3   GFR: Estimated Creatinine Clearance: 161.7 mL/min (by C-G formula based on SCr of 0.8 mg/dL). Liver Function Tests: No results for input(s): AST, ALT, ALKPHOS, BILITOT, PROT, ALBUMIN in the last 168 hours. No results for input(s): LIPASE, AMYLASE in the last 168 hours. No results for input(s): AMMONIA in the last 168 hours. Coagulation Profile:  Recent Labs Lab 02/10/17 0536 02/11/17 0336  INR 1.05 1.07   Cardiac Enzymes:  Recent Labs Lab 02/09/17 2026  TROPONINI <0.03   BNP (last 3 results) No results for input(s): PROBNP in the last 8760 hours. HbA1C: No results for input(s): HGBA1C in the last 72 hours. CBG: No results for  input(s): GLUCAP in the last 168 hours. Lipid Profile: No results for input(s): CHOL, HDL, LDLCALC, TRIG, CHOLHDL, LDLDIRECT in the last 72 hours. Thyroid Function Tests: No results for input(s): TSH, T4TOTAL, FREET4, T3FREE, THYROIDAB in the last 72 hours. Anemia Panel: No results for input(s): VITAMINB12, FOLATE, FERRITIN, TIBC, IRON, RETICCTPCT in the last 72 hours. Sepsis Labs: No results for input(s): PROCALCITON, LATICACIDVEN in the last 168 hours.  No results found for this or any previous visit (from the past 240 hour(s)).       Radiology Studies: Ct Angio Chest Pe W And/or Wo Contrast  Result Date: 02/09/2017 CLINICAL DATA:  Chest pain starting today. Patient being treated for pulmonary embolus diagnosed in June. EXAM: CT ANGIOGRAPHY CHEST WITH CONTRAST TECHNIQUE: Multidetector CT imaging of the chest was performed using the standard protocol during bolus administration of intravenous contrast. Multiplanar CT image reconstructions and MIPs were obtained to evaluate the vascular anatomy. CONTRAST:  100 cc Isovue 370 IV COMPARISON:  02/01/2017 FINDINGS: Cardiovascular: The study is slightly limited for the evaluation of central pulmonary embolism due to patient body habitus and timing of contrast bolus. Tiny strand-like nonocclusive filling defects to the left lower lobe consistent with small pulmonary emboli are noted. No heart strain. Great vessels are normal in course and caliber. Normal heart size. No significant pericardial fluid/thickening. Mediastinum/Nodes: No discrete thyroid nodules. Unremarkable esophagus. No pathologically enlarged axillary, mediastinal or hilar lymph nodes. Lungs/Pleura: No pneumothorax. No pleural effusion. No acute pulmonary consolidation or dominant mass. Upper abdomen: Unremarkable. Musculoskeletal:  No aggressive appearing focal osseous lesions. Review of the MIP images confirms the above findings. IMPRESSION: Tiny strand-like filling defects within left  lower lobe pulmonary pulmonary arteries consistent with nonocclusive pulmonary emboli. No heart strain. Critical Value/emergent results were called by telephone at the time of interpretation on 02/09/2017 at 9:55 pm to [facility] , who verbally acknowledged these results. Electronically Signed   By: Tollie Eth M.D.   On: 02/09/2017 21:55        Scheduled Meds: . cephALEXin  500 mg Oral Q8H  . hydroxychloroquine  200 mg Oral Daily  . methylPREDNISolone  4 mg Oral Daily  . patient's guide to using coumadin book   Does not apply Once  . senna  1 tablet Oral BID  . warfarin  10 mg Oral ONCE-1800  . Warfarin - Pharmacist Dosing Inpatient   Does not apply q1800   Continuous Infusions: . heparin 1,600 Units/hr (02/11/17 0131)     LOS: 2 days    Time spent: 35 minutes.     Kathlen Mody, MD [facility] Pager [phone]  If 7PM-7AM, please contact night-coverage [email or web address] Password [facility] 02/11/2017, 4:41 PM

## 2017-02-12 ENCOUNTER — Inpatient Hospital Stay (HOSPITAL_COMMUNITY): Payer: 59

## 2017-02-12 DIAGNOSIS — I2699 Other pulmonary embolism without acute cor pulmonale: Secondary | ICD-10-CM

## 2017-02-12 DIAGNOSIS — R0602 Shortness of breath: Secondary | ICD-10-CM

## 2017-02-12 LAB — ECHOCARDIOGRAM COMPLETE
Height: 68 in
Weight: 5294.4 [oz_av]

## 2017-02-12 LAB — CBC
HCT: 38.1 % (ref 36.0–46.0)
Hemoglobin: 12.1 g/dL (ref 12.0–15.0)
MCH: 26.1 pg (ref 26.0–34.0)
MCHC: 31.8 g/dL (ref 30.0–36.0)
MCV: 82.3 fL (ref 78.0–100.0)
Platelets: 363 K/uL (ref 150–400)
RBC: 4.63 MIL/uL (ref 3.87–5.11)
RDW: 14.2 % (ref 11.5–15.5)
WBC: 6.5 K/uL (ref 4.0–10.5)

## 2017-02-12 LAB — URINE CULTURE

## 2017-02-12 LAB — PROTIME-INR
INR: 1.11
Prothrombin Time: 14.4 s (ref 11.4–15.2)

## 2017-02-12 LAB — HEPARIN LEVEL (UNFRACTIONATED): HEPARIN UNFRACTIONATED: 0.6 [IU]/mL (ref 0.30–0.70)

## 2017-02-12 MED ORDER — WARFARIN SODIUM 7.5 MG PO TABS
15.0000 mg | ORAL_TABLET | Freq: Once | ORAL | Status: AC
Start: 2017-02-12 — End: 2017-02-12
  Administered 2017-02-12: 15 mg via ORAL
  Filled 2017-02-12: qty 2

## 2017-02-12 MED ORDER — WARFARIN SODIUM 2.5 MG PO TABS: 12.5000 mg | ORAL_TABLET | Freq: Once | ORAL | Status: DC

## 2017-02-12 NOTE — Progress Notes (Signed)
ANTICOAGULATION CONSULT NOTE - Follow Up Consult  Pharmacy Consult for heparin/warfarin Indication: pulmonary embolus  Allergies  Allergen Reactions  . Bactrim Anaphylaxis and Hives  . Prednisone Anaphylaxis and Hives    Has been on Dexamethasone without issue.  . Nsaids Other (See Comments)    Has Lupus, reccommended to avoid NSAIDs  . Other Itching    Shrimp:throat itching "can eat other shellfish"  . Pineapple Itching  . Procardia [Nifedipine] Swelling    Swelling of tongue  . Vicodin [Hydrocodone-Acetaminophen] Hives and Swelling    Can take Percocet without difficulty    Patient Measurements: Height: 5\' 8"  (172.7 cm) Weight: (!) 330 lb 14.4 oz (150.1 kg) IBW/kg (Calculated) : 63.9  Vital Signs: Temp: 97.8 F (36.6 C) (07/23 0524) Temp Source: Oral (07/23 0524) BP: 110/69 (07/23 0524) Pulse Rate: 74 (07/23 0524)  Labs:  Recent Labs  02/09/17 2026  02/10/17 0536 02/10/17 1337 02/11/17 0336 02/12/17 0231 02/12/17 0811  HGB 11.9*  --   --   --   --   --  12.1  HCT 35.9*  --   --   --   --   --  38.1  PLT 384  --   --   --   --   --  363  APTT  --   --  100*  --   --   --   --   LABPROT  --   --  13.8  --  13.9 14.4  --   INR  --   --  1.05  --  1.07 1.11  --   HEPARINUNFRC  --   < > 0.70 0.50 0.57 0.60  --   CREATININE 0.80  --   --   --   --   --   --   TROPONINI <0.03  --   --   --   --   --   --   < > = values in this interval not displayed.  Estimated Creatinine Clearance: 161.2 mL/min (by C-G formula based on SCr of 0.8 mg/dL).  Assessment: 31 y/o F was being treated with Apixaban 5 mg BID for PE that was diagnosed on 12/30/2016. She was initially on Lovenox for several weeks then switched to Apixaban after hematology consult. [facility] and "knot in foot". CT Angio positive for small PE (unsure if acute or residual from PE in June). Last dose of apixaban was ~7/19. Currently on warfarin with heparin bridge. CBC stable  and platelets within normal limits. No bleeding noted.  -7/23  heparin level: therapeutic at 0.6 -7/23 INR 1.11  Goal of Therapy:  Heparin level 0.3-0.7 units/ml INR goal: 2-3 Monitor platelets by anticoagulation protocol: Yes   Plan:  Continue heparin drip at 1,600 units/hour Warfarin 12.5 mg PO x1 tonight Daily heparin level/INR Monitor daily CBC Monitor for signs and symptoms of bleeding   Blake DivineShannon Kelon Easom, Pharm.D. PGY1 Pharmacy Resident 02/12/2017 11:26 AM Main Pharmacy: [phone] Pager: [phone]

## 2017-02-12 NOTE — Progress Notes (Signed)
VASCULAR LAB PRELIMINARY  PRELIMINARY  PRELIMINARY  PRELIMINARY  Bilateral lower extremity venous duplex completed.    Preliminary report:  Bilateral:  No evidence of DVT, superficial thrombosis, or Baker's Cyst. No change from study of June 5th,8th,and 11th.   Destanie Tibbetts, RVS 02/12/2017, 12:14 PM

## 2017-02-12 NOTE — Progress Notes (Signed)
  Echocardiogram 2D Echocardiogram has been performed.  Janalyn HarderWest, Shawnya Mayor R 02/12/2017, 4:41 PM

## 2017-02-12 NOTE — Progress Notes (Addendum)
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 She failed treatment with xarelto and eliquis, and developed PE'S, so ultimately, she was started on IV heparin and coumadin. Oncology consulted overnight, recommended referral to tertiary care for anti coagulation specialist on discharge.   Assessment & Plan:   Principal Problem:   Pulmonary embolus (HCC) Active Problems:   Asthma   Lupus   Antiphospholipid antibody syndrome (HCC)   Pulmonary embolus:  Started on IV heparin and coumadin.  INR still sub therapeutic.  Pt reports some foot swelling, new compared to admission associated with some tingling.  Venous duplex lower extremity ordered and it was negative for DVT.  Echocardiogram PENDING.  Her H&h is stable on IV heparin.    Asthma, no wheezing heard.  Alb as needed.    H/o lupus and APLA Syndrome:  no signs of exacerbation.  Resume medrol and plaquenil.    Hematuria and abnormal UA:  Urine cultures sent and started her on keflex empirically.  Her H&H is stable. Her hematuria improved.    Constipation:  On senna colace    DVT prophylaxis: heparin and coumadin.  Code Status: full code.  Family Communication: none at bedside.  Disposition Plan: home when INR is therapeutic.   Consultants:   oncology   Procedures: Echocardiogram   Antimicrobials: keflex added for UTI.    Subjective: Intermittent chest pain on deep breathing.  Pain controlled with oxycodone.   Objective: Vitals:   02/11/17 1412 02/11/17 2036 02/12/17 0524 02/12/17 1430  BP: 117/75 131/79 110/69 116/60  Pulse: 71 70 74 73  Resp:  18 18   Temp: 98.1 F (36.7 C) 98.1 F (36.7 C) 97.8 F (36.6 C)  98.1 F (36.7 C)  TempSrc: Oral Oral Oral Oral  SpO2: 99% 100% 96% 99%  Weight:   (!) 150.1 kg (330 lb 14.4 oz)   Height:        Intake/Output Summary (Last 24 hours) at 02/12/17 1437 Last data filed at 02/12/17 0900  Gross per 24 hour  Intake            984.8 ml  Output                0 ml  Net            984.8 ml   Filed Weights   02/10/17 0038 02/11/17 0501 02/12/17 0524  Weight: (!) 152 kg (335 lb 3.2 oz) (!) 150.8 kg (332 lb 8 oz) (!) 150.1 kg (330 lb 14.4 oz)    Examination:  General exam: Appears calm and comfortable on RA. Respiratory system: Clear to auscultation. Respiratory effort normal.NO WHEEZING OR RHONCHI.  Cardiovascular system: S1 & S2 heard, RRR. No JVD, murmurs, rubs, gallops or clicks. Gastrointestinal system: Abdomen is soft NT ND BS+ Central nervous system: Alert and oriented. No focal neurological deficits. Extremities: Symmetric 5 x 5 power. TRACE PEDAL EDEMA.  Skin: No rashes, lesions or ulcers Psychiatry: Judgement and insight appear normal. Mood & affect appropriate.     Data Reviewed: I have personally reviewed following labs and imaging studies  CBC:  Recent Labs Lab 02/09/17 2026 02/12/17 0811  WBC 6.3 6.5  HGB 11.9* 12.1  HCT 35.9* 38.1  MCV 83.1 82.3  PLT 384 363   Basic Metabolic Panel:  Recent Labs Lab 02/09/17 2026  NA 135  K 3.8  CL 101  CO2 26  GLUCOSE 115*  BUN 15  CREATININE 0.80  CALCIUM 9.3   GFR: Estimated Creatinine Clearance: 161.2 mL/min (by C-G formula based on SCr of 0.8 mg/dL). Liver Function Tests: No results for input(s): AST, ALT, ALKPHOS, BILITOT, PROT, ALBUMIN in the last 168 hours. No results for input(s): LIPASE, AMYLASE in the last 168 hours. No results for input(s): AMMONIA in the last 168 hours. Coagulation Profile:  Recent Labs Lab 02/10/17 0536 02/11/17 0336 02/12/17 0231  INR 1.05 1.07 1.11   Cardiac Enzymes:  Recent Labs Lab 02/09/17 2026  TROPONINI <0.03   BNP (last 3  results) No results for input(s): PROBNP in the last 8760 hours. HbA1C: No results for input(s): HGBA1C in the last 72 hours. CBG: No results for input(s): GLUCAP in the last 168 hours. Lipid Profile: No results for input(s): CHOL, HDL, LDLCALC, TRIG, CHOLHDL, LDLDIRECT in the last 72 hours. Thyroid Function Tests: No results for input(s): TSH, T4TOTAL, FREET4, T3FREE, THYROIDAB in the last 72 hours. Anemia Panel: No results for input(s): VITAMINB12, FOLATE, FERRITIN, TIBC, IRON, RETICCTPCT in the last 72 hours. Sepsis Labs: No results for input(s): PROCALCITON, LATICACIDVEN in the last 168 hours.  No results found for this or any previous visit (from the past 240 hour(s)).       Radiology Studies: No results found.      Scheduled Meds: . cephALEXin  500 mg Oral Q8H  . hydroxychloroquine  200 mg Oral Daily  . methylPREDNISolone  4 mg Oral Daily  . senna  1 tablet Oral BID  . warfarin  15 mg Oral ONCE-1800  . Warfarin - Pharmacist Dosing Inpatient   Does not apply q1800   Continuous Infusions: . heparin 1,600 Units/hr (02/11/17 1807)     LOS: 3 days    Time spent: 35 minutes.     Kathlen ModyAKULA,Keyunna Coco, MD [facility] Pager [phone]  If 7PM-7AM, please contact night-coverage [email or web address] Password [facility] 02/12/2017, 2:37 PM

## 2017-02-13 LAB — CBC
HEMATOCRIT: 37.5 % (ref 36.0–46.0)
HEMOGLOBIN: 12.1 g/dL (ref 12.0–15.0)
MCH: 27 pg (ref 26.0–34.0)
MCHC: 32.3 g/dL (ref 30.0–36.0)
MCV: 83.7 fL (ref 78.0–100.0)
Platelets: 341 10*3/uL (ref 150–400)
RBC: 4.48 MIL/uL (ref 3.87–5.11)
RDW: 14.7 % (ref 11.5–15.5)
WBC: 7.4 10*3/uL (ref 4.0–10.5)

## 2017-02-13 LAB — PROTIME-INR
INR: 1.12
Prothrombin Time: 14.5 seconds (ref 11.4–15.2)

## 2017-02-13 LAB — HEPARIN LEVEL (UNFRACTIONATED): Heparin Unfractionated: 0.58 IU/mL (ref 0.30–0.70)

## 2017-02-13 MED ORDER — WARFARIN SODIUM 7.5 MG PO TABS
15.0000 mg | ORAL_TABLET | Freq: Once | ORAL | Status: AC
  Administered 2017-02-13: 15 mg via ORAL
  Filled 2017-02-13: qty 2

## 2017-02-13 MED ORDER — MORPHINE SULFATE (PF) 2 MG/ML IV SOLN
1.0000 mg | Freq: Once | INTRAVENOUS | Status: AC
  Administered 2017-02-13: 1 mg via INTRAVENOUS
  Filled 2017-02-13: qty 1

## 2017-02-13 NOTE — Progress Notes (Signed)
Pt c/o of left sided chest pain that is sharp and constant. 2 tabs of 5-325 PO percocet brought pain down from a 8/10 to 6/10. Pain still unrelieved, verbal order received from Dr. Toniann FailKakrakandy for a one time dose of 1mg  of IV morphine. Will continue to monitor.  Viviano SimasMorgan N Shawnte Demarest, RN

## 2017-02-13 NOTE — Plan of Care (Signed)
Problem: Pain Managment: Goal: General experience of comfort will improve Outcome: Progressing Pt with chest discomfort that was rated a 8/10. PO percocets not effective. IV morphine administered with much relief.

## 2017-02-13 NOTE — Progress Notes (Addendum)
ANTICOAGULATION CONSULT NOTE - Follow Up Consult  Pharmacy Consult:  Heparin/Coumadin (Overlap D#4/5) Indication: pulmonary embolus  Allergies  Allergen Reactions  . Bactrim Anaphylaxis and Hives  . Prednisone Anaphylaxis and Hives    Has been on Dexamethasone without issue.  . Nsaids Other (See Comments)    Has Lupus, reccommended to avoid NSAIDs  . Other Itching    Shrimp:throat itching "can eat other shellfish"  . Pineapple Itching  . Procardia [Nifedipine] Swelling    Swelling of tongue  . Vicodin [Hydrocodone-Acetaminophen] Hives and Swelling    Can take Percocet without difficulty    Patient Measurements: Height: 5\' 8"  (172.7 cm) Weight: (!) 331 lb 8 oz (150.4 kg) IBW/kg (Calculated) : 63.9  Heparin dosing weight  = 101 kg  Vital Signs: Temp: 98.1 F (36.7 C) (07/24 0512) Temp Source: Oral (07/24 0512) BP: 117/75 (07/24 0512) Pulse Rate: 76 (07/24 0512)  Labs:  Recent Labs  02/11/17 0336 02/12/17 0231 02/12/17 0811 02/13/17 0234  HGB  --   --  12.1 12.1  HCT  --   --  38.1 37.5  PLT  --   --  363 341  LABPROT 13.9 14.4  --  14.5  INR 1.07 1.11  --  1.12  HEPARINUNFRC 0.57 0.60  --  0.58    Estimated Creatinine Clearance: 161.3 mL/min (by C-G formula based on SCr of 0.8 mg/dL).   Assessment: 31 YOF on Eliquis PTA for history of PE that was diagnosed on 12/30/2016. She was initially on Lovenox for several weeks and then switched to Eliquis after a hematology consult. Patient admitted with chest pain and "knot in foot."  CTA positive for a small PE (unsure if acute or residual from PE in June).  Doppler negative for DVT.  Patient was transitioned to Coumadin and IV heparin bridge.  Heparin level is therapeutic and INR is sub-therapeutic despite large doses of Coumadin.  No bleeding reported.   Goal of Therapy:  Heparin level 0.3-0.7 units/ml INR goal: 2-3 Monitor platelets by anticoagulation protocol: Yes    Plan:  Continue heparin gtt at 1600  units/hr Repeat Coumadin 15mg  PO today Daily heparin level, PT / INR and CBC F/U abx LOT   Yaris Ferrell D. Laney Potashang, PharmD, BCPS Pager:  [phone] 02/13/2017, 8:28 AM

## 2017-02-13 NOTE — Progress Notes (Signed)
PROGRESS NOTE    Hayley Webb  ZOX:[id number] DOB: 1985/08/22 DOA: 02/09/2017 PCP: Garnetta Buddy, PA    Brief Narrative: Hayley Webb a 31 y.o.femalewith a past medical history significant for SLE with APLA on Eliquis, morbid obesity, and mild asthmawho presents with chest pain, was found to have new PE being on eliquis. She failed treatment with xarelto and eliquis, and developed PE'S, so ultimately, she was started on IV heparin and coumadin. Oncology consulted on admission, recommended referral to tertiary care for anti coagulation specialist on discharge.   Assessment & Plan:   Principal Problem:   Pulmonary embolus (HCC) Active Problems:   Asthma   Lupus   Antiphospholipid antibody syndrome (HCC)   Pulmonary embolus:  Started on IV heparin and coumadin.  INR still sub therapeutic.  Venous duplex lower extremity ordered and it was negative for DVT.  Echocardiogram shows LVEF of 65%, no wall motion abnormalities, right ventricle is normal with normal systolic function.  Intermittent atypical chest pain on deep inspiration relieved with oral and IV pain meds.    Asthma, no wheezing heard.  Albuterol as needed.    H/o lupus and APLA Syndrome:  no signs of exacerbation.  Resume medrol and plaquenil.    Hematuria and abnormal UA:  Urine cultures sent and started her on keflex empirically.  Her H&H is stable. Her hematuria resolved.  Urine cultures show lactobacillus.    Constipation: resolved.  On senna colace    DVT prophylaxis: heparin and coumadin.  Code Status: full code.  Family Communication: none at bedside.  Disposition Plan: home when INR is therapeutic.   Consultants:   oncology   Procedures: Echocardiogram   Antimicrobials: keflex added for UTI.    Subjective: Intermittent chest pain on deep breathing.  Pain controlled with oxycodone and prn morphine.  Objective: Vitals:   02/12/17 1430 02/12/17 2053 02/12/17 2334 02/13/17  0512  BP: 116/60 123/74 112/65 117/75  Pulse: 73 86 71 76  Resp:  18  18  Temp: 98.1 F (36.7 C) 98.2 F (36.8 C) 98.2 F (36.8 C) 98.1 F (36.7 C)  TempSrc: Oral Oral Oral Oral  SpO2: 99% 99% 100% 97%  Weight:    (!) 150.4 kg (331 lb 8 oz)  Height:        Intake/Output Summary (Last 24 hours) at 02/13/17 0830 Last data filed at 02/12/17 2150  Gross per 24 hour  Intake           1841.2 ml  Output                0 ml  Net           1841.2 ml   Filed Weights   02/11/17 0501 02/12/17 0524 02/13/17 0512  Weight: (!) 150.8 kg (332 lb 8 oz) (!) 150.1 kg (330 lb 14.4 oz) (!) 150.4 kg (331 lb 8 oz)    Examination:  General exam: Appears calm and comfortable on RA. Respiratory system: air entry fair,no wheezing or rhonchi.  Cardiovascular system: S1 & S2 heard, RRR. No JVD, murmurs, rubs, gallops or clicks.  Gastrointestinal system: Abdomen is soft NT ND BS+ Central nervous system: Alert and oriented. No focal neurological deficits. Extremities: Symmetric 5 x 5 power. Trace pedal edema.  Skin: No rashes, lesions or ulcers Psychiatry: Judgement and insight appear normal. Mood & affect appropriate.     Data Reviewed: I have personally reviewed following labs and imaging studies  CBC:  Recent Labs Lab 02/09/17 2026 02/12/17  [id number] 02/13/17 0234  WBC 6.3 6.5 7.4  HGB 11.9* 12.1 12.1  HCT 35.9* 38.1 37.5  MCV 83.1 82.3 83.7  PLT 384 363 341   Basic Metabolic Panel:  Recent Labs Lab 02/09/17 2026  NA 135  K 3.8  CL 101  CO2 26  GLUCOSE 115*  BUN 15  CREATININE 0.80  CALCIUM 9.3   GFR: Estimated Creatinine Clearance: 161.3 mL/min (by C-G formula based on SCr of 0.8 mg/dL). Liver Function Tests: No results for input(s): AST, ALT, ALKPHOS, BILITOT, PROT, ALBUMIN in the last 168 hours. No results for input(s): LIPASE, AMYLASE in the last 168 hours. No results for input(s): AMMONIA in the last 168 hours. Coagulation Profile:  Recent Labs Lab 02/10/17 0536  02/11/17 0336 02/12/17 0231 02/13/17 0234  INR 1.05 1.07 1.11 1.12   Cardiac Enzymes:  Recent Labs Lab 02/09/17 2026  TROPONINI <0.03   BNP (last 3 results) No results for input(s): PROBNP in the last 8760 hours. HbA1C: No results for input(s): HGBA1C in the last 72 hours. CBG: No results for input(s): GLUCAP in the last 168 hours. Lipid Profile: No results for input(s): CHOL, HDL, LDLCALC, TRIG, CHOLHDL, LDLDIRECT in the last 72 hours. Thyroid Function Tests: No results for input(s): TSH, T4TOTAL, FREET4, T3FREE, THYROIDAB in the last 72 hours. Anemia Panel: No results for input(s): VITAMINB12, FOLATE, FERRITIN, TIBC, IRON, RETICCTPCT in the last 72 hours. Sepsis Labs: No results for input(s): PROCALCITON, LATICACIDVEN in the last 168 hours.  Recent Results (from the past 240 hour(s))  Culture, Urine     Status: Abnormal   Collection Time: 02/11/17  1:52 PM  Result Value Ref Range Status   Specimen Description URINE, RANDOM  Final   Special Requests NONE  Final   Culture (A)  Final    >=100,000 COLONIES/mL LACTOBACILLUS SPECIES Standardized susceptibility testing for this organism is not available.    Report Status 02/12/2017 FINAL  Final         Radiology Studies: No results found.      Scheduled Meds: . cephALEXin  500 mg Oral Q8H  . hydroxychloroquine  200 mg Oral Daily  . methylPREDNISolone  4 mg Oral Daily  . senna  1 tablet Oral BID  . warfarin  15 mg Oral ONCE-1800  . Warfarin - Pharmacist Dosing Inpatient   Does not apply q1800   Continuous Infusions: . heparin 1,600 Units/hr (02/13/17 0250)     LOS: 4 days    Time spent: 35 minutes.     Kathlen ModyAKULA,Hulan Szumski, MD [facility] Pager [phone]  If 7PM-7AM, please contact night-coverage [email or web address] Password [email or web address] 02/13/2017, 8:30 AM

## 2017-02-14 DIAGNOSIS — R0789 Other chest pain: Secondary | ICD-10-CM

## 2017-02-14 LAB — CBC
HCT: 36.6 % (ref 36.0–46.0)
HEMOGLOBIN: 11.7 g/dL — AB (ref 12.0–15.0)
MCH: 26.4 pg (ref 26.0–34.0)
MCHC: 32 g/dL (ref 30.0–36.0)
MCV: 82.4 fL (ref 78.0–100.0)
Platelets: 356 10*3/uL (ref 150–400)
RBC: 4.44 MIL/uL (ref 3.87–5.11)
RDW: 14.3 % (ref 11.5–15.5)
WBC: 7.3 10*3/uL (ref 4.0–10.5)

## 2017-02-14 LAB — VITAMIN D 25 HYDROXY (VIT D DEFICIENCY, FRACTURES): VIT D 25 HYDROXY: 13.6 ng/mL — AB (ref 30.0–100.0)

## 2017-02-14 LAB — CARDIOLIPIN ANTIBODIES, IGG, IGM, IGA
Anticardiolipin IgG: <9 GPL U/mL (ref 0–14)
Anticardiolipin IgM: <9 MPL U/mL (ref 0–12)

## 2017-02-14 LAB — HEPARIN LEVEL (UNFRACTIONATED)
Heparin Unfractionated: 0.53 IU/mL (ref 0.30–0.70)
Heparin Unfractionated: 0.41 IU/mL (ref 0.30–0.70)

## 2017-02-14 LAB — PROTIME-INR
INR: 1.31
PROTHROMBIN TIME: 16.4 s — AB (ref 11.4–15.2)

## 2017-02-14 MED ORDER — WARFARIN SODIUM 7.5 MG PO TABS
15.0000 mg | ORAL_TABLET | Freq: Once | ORAL | Status: AC
  Administered 2017-02-14: 15 mg via ORAL
  Filled 2017-02-14: qty 2

## 2017-02-14 MED ORDER — MORPHINE SULFATE (PF) 2 MG/ML IV SOLN
2.0000 mg | INTRAVENOUS | Status: DC | PRN
  Administered 2017-02-14 – 2017-02-18 (×11): 2 mg via INTRAVENOUS
  Filled 2017-02-14 (×11): qty 1

## 2017-02-14 MED ORDER — DICLOFENAC SODIUM 1 % TD GEL
2.0000 g | Freq: Four times a day (QID) | TRANSDERMAL | Status: DC
  Administered 2017-02-14 – 2017-02-16 (×2): 2 g via TOPICAL
  Filled 2017-02-14: qty 100

## 2017-02-14 NOTE — Progress Notes (Signed)
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 She has failed treatment with xarelto and eliquis and has been started on IV heparin and coumadin. Oncology consulted on admission and recommended referral to tertiary care for anti coagulation specialist on discharge.    Subjective: Pain in left chest when sitting up and walking- Increases to a 9/10.  ROS: no complaints of nausea, vomiting, constipation diarrhea, cough, dyspnea or dysuria. No other complaints.   Assessment & Plan:   Principal Problem:   Pulmonary embolus - cont Heparin and Coumadin until INR therapeutic - outpt eval for underlying coagulation disorder  Active Problems: Chest pain - is reproducible- Voltaren gel ordered    Asthma - stable   Lupus/   Antiphospholipid antibody syndrome  - cont Plaquenil and steroids  Abnormal UA - UA was bloody ( may be due to being on Heparin) and had a small amount of leukocytes and many bacteria  - given Keflex- today is day 4 - > 100,000 lactobacillus isolated but patient is asymptomatic- will d/c Keflex   DVT prophylaxis: Heparin Code Status: Full Disposition Plan: home when INR therapeutic Consultants:   hematology Procedures:    Antimicrobials:  Anti-infectives    Start     Dose/Rate Route Frequency Ordered Stop   02/11/17 1645  cephALEXin (KEFLEX) capsule 500 mg     500 mg Oral Every 8 hours 02/11/17 1640     02/10/17 1000  hydroxychloroquine (PLAQUENIL) tablet 200 mg     200 mg Oral Daily 02/10/17 0134         Objective: Vitals:   02/13/17 1454 02/13/17 2052 02/14/17 0357 02/14/17 1425  BP: 123/60 128/84 102/62 (!) 134/91  Pulse: 85 72 67 77  Resp:        Temp: 98.4 F (36.9 C) 97.7 F (36.5 C) 97.7 F (36.5 C) 97.6 F (36.4 C)  TempSrc: Oral Oral Oral Axillary  SpO2: 99% 100% 99% 100%  Weight:   (!) 150.5 kg (331 lb 14.4 oz)   Height:        Intake/Output Summary (Last 24 hours) at 02/14/17 1535 Last data filed at 02/14/17 0856  Gross per 24 hour  Intake              360 ml  Output                0 ml  Net              360 ml   Filed Weights   02/12/17 0524 02/13/17 0512 02/14/17 0357  Weight: (!) 150.1 kg (330 lb 14.4 oz) (!) 150.4 kg (331 lb 8 oz) (!) 150.5 kg (331 lb 14.4 oz)    Examination: General exam: Appears comfortable  HEENT: PERRLA, oral mucosa moist, no sclera icterus or thrush Respiratory system: Clear to auscultation. Respiratory effort normal. Chest wall: tenderness in left chest wall Cardiovascular system: S1 & S2 heard, RRR.  No murmurs  Gastrointestinal system: Abdomen soft, non-tender, nondistended. Normal bowel sound. No organomegaly Central nervous system: Alert and oriented. No focal neurological deficits. Extremities: No cyanosis, clubbing or edema Skin: No rashes or ulcers Psychiatry:  Mood & affect appropriate.  Data Reviewed: I have personally reviewed following labs and imaging studies  CBC:  Recent Labs Lab 02/09/17 2026 02/12/17 0811 02/13/17 0234 02/14/17 0228  WBC 6.3 6.5 7.4 7.3  HGB 11.9* 12.1 12.1 11.7*  HCT 35.9* 38.1 37.5 36.6  MCV 83.1 82.3 83.7 82.4  PLT 384 363 341 356   Basic Metabolic Panel:  Recent Labs Lab 02/09/17 2026  NA 135  K 3.8  CL 101  CO2 26  GLUCOSE 115*  BUN 15  CREATININE 0.80  CALCIUM 9.3   GFR: Estimated Creatinine Clearance: 161.3 mL/min (by C-G formula based on SCr of 0.8 mg/dL). Liver Function Tests: No results for input(s): AST, ALT, ALKPHOS, BILITOT, PROT, ALBUMIN in the last 168 hours. No results for input(s): LIPASE, AMYLASE in the last 168 hours. No results for input(s): AMMONIA in the last 168 hours. Coagulation  Profile:  Recent Labs Lab 02/10/17 0536 02/11/17 0336 02/12/17 0231 02/13/17 0234 02/14/17 0228  INR 1.05 1.07 1.11 1.12 1.31   Cardiac Enzymes:  Recent Labs Lab 02/09/17 2026  TROPONINI <0.03   BNP (last 3 results) No results for input(s): PROBNP in the last 8760 hours. HbA1C: No results for input(s): HGBA1C in the last 72 hours. CBG: No results for input(s): GLUCAP in the last 168 hours. Lipid Profile: No results for input(s): CHOL, HDL, LDLCALC, TRIG, CHOLHDL, LDLDIRECT in the last 72 hours. Thyroid Function Tests: No results for input(s): TSH, T4TOTAL, FREET4, T3FREE, THYROIDAB in the last 72 hours. Anemia Panel: No results for input(s): VITAMINB12, FOLATE, FERRITIN, TIBC, IRON, RETICCTPCT in the last 72 hours. Urine analysis:    Component Value Date/Time   COLORURINE YELLOW 02/11/2017 1348   APPEARANCEUR HAZY (A) 02/11/2017 1348   LABSPEC 1.030 02/11/2017 1348   PHURINE 6.0 02/11/2017 1348   GLUCOSEU NEGATIVE 02/11/2017 1348   HGBUR LARGE (A) 02/11/2017 1348   BILIRUBINUR NEGATIVE 02/11/2017 1348   KETONESUR NEGATIVE 02/11/2017 1348   PROTEINUR 30 (A) 02/11/2017 1348   UROBILINOGEN 1.0 04/28/2015 1930   NITRITE NEGATIVE 02/11/2017 1348   LEUKOCYTESUR SMALL (A) 02/11/2017 1348   Sepsis Labs: @LABRCNTIP (procalcitonin:4,lacticidven:4) ) Recent Results (from the past 240 hour(s))  Culture, Urine     Status: Abnormal   Collection Time: 02/11/17  1:52 PM  Result Value Ref Range Status   Specimen Description URINE, RANDOM  Final   Special Requests NONE  Final   Culture (A)  Final    >=100,000 COLONIES/mL LACTOBACILLUS SPECIES Standardized susceptibility testing for this organism is not available.    Report Status 02/12/2017 FINAL  Final         Radiology Studies: No results found.    Scheduled Meds: . cephALEXin  500 mg Oral Q8H  . diclofenac sodium  2 g Topical QID  . hydroxychloroquine  200 mg Oral Daily  . methylPREDNISolone  4 mg Oral Daily   . senna  1 tablet Oral BID  . warfarin  15 mg Oral ONCE-1800  . Warfarin - Pharmacist Dosing Inpatient   Does not apply q1800   Continuous Infusions: . heparin 1,600 Units/hr (02/14/17 1501)     LOS: 5 days    Time spent in minutes: 35    Calvert CantorSaima Paco Cislo, MD [facility] Pager: [email or web address] Password [email or web address] 02/14/2017, 3:35 PM

## 2017-02-14 NOTE — Progress Notes (Signed)
ANTICOAGULATION CONSULT NOTE - Follow Up Consult  Pharmacy Consult:  Heparin/Coumadin (Overlap D#5/5) Indication: pulmonary embolus  Allergies  Allergen Reactions  . Bactrim Anaphylaxis and Hives  . Prednisone Anaphylaxis and Hives    Has been on Dexamethasone without issue.  . Nsaids Other (See Comments)    Has Lupus, reccommended to avoid NSAIDs  . Other Itching    Shrimp:throat itching "can eat other shellfish"  . Pineapple Itching  . Procardia [Nifedipine] Swelling    Swelling of tongue  . Vicodin [Hydrocodone-Acetaminophen] Hives and Swelling    Can take Percocet without difficulty    Patient Measurements: Height: 5\' 8"  (172.7 cm) Weight: (!) 331 lb 14.4 oz (150.5 kg) IBW/kg (Calculated) : 63.9  Heparin dosing weight  = 101 kg  Vital Signs: Temp: 97.7 F (36.5 C) (07/25 0357) Temp Source: Oral (07/25 0357) BP: 102/62 (07/25 0357) Pulse Rate: 67 (07/25 0357)  Labs:  Recent Labs  02/12/17 0231  02/12/17 0811 02/13/17 0234 02/14/17 0228  HGB  --   < > 12.1 12.1 11.7*  HCT  --   --  38.1 37.5 36.6  PLT  --   --  363 341 356  LABPROT 14.4  --   --  14.5 16.4*  INR 1.11  --   --  1.12 1.31  HEPARINUNFRC 0.60  --   --  0.58 0.53  < > = values in this interval not displayed.  Estimated Creatinine Clearance: 161.3 mL/min (by C-G formula based on SCr of 0.8 mg/dL).   Assessment: 31 YOF on Eliquis PTA for history of PE that was diagnosed on 12/30/2016. She was initially on Lovenox for several weeks and then switched to Eliquis after a hematology consult. Patient admitted with chest pain and "knot in foot."  CTA positive for a small PE (unsure if acute or residual from PE in June).  Doppler negative for DVT.  Patient was transitioned to Coumadin and IV heparin bridge.  Heparin level is therapeutic and INR remains sub-therapeutic at 1.31 today, despite large doses of Coumadin. H&H, pltc stable. No bleeding reported.   Goal of Therapy:  Heparin level 0.3-0.7  units/ml INR goal: 2-3 Monitor platelets by anticoagulation protocol: Yes    Plan:  Continue heparin gtt at 1600 units/hr Repeat Coumadin 15mg  PO today Daily heparin level, PT / INR and CBC   Erin N. Zigmund Danieleja, PharmD PGY1 Pharmacy Resident Pager: [phone]  02/14/2017, 8:33 AM   Chelsea Aushuy D. Laney Potashang, PharmD, BCPS Pager:  [phone] 02/14/2017, 10:30 AM

## 2017-02-14 NOTE — Telephone Encounter (Signed)
Any suggestion on how we can move forward with this?

## 2017-02-15 DIAGNOSIS — I2690 Septic pulmonary embolism without acute cor pulmonale: Secondary | ICD-10-CM

## 2017-02-15 LAB — CBC
HEMATOCRIT: 35.5 % — AB (ref 36.0–46.0)
HEMOGLOBIN: 11.5 g/dL — AB (ref 12.0–15.0)
MCH: 26.7 pg (ref 26.0–34.0)
MCHC: 32.4 g/dL (ref 30.0–36.0)
MCV: 82.6 fL (ref 78.0–100.0)
Platelets: 337 10*3/uL (ref 150–400)
RBC: 4.3 MIL/uL (ref 3.87–5.11)
RDW: 14.4 % (ref 11.5–15.5)
WBC: 7.9 10*3/uL (ref 4.0–10.5)

## 2017-02-15 LAB — PROTIME-INR
INR: 1.48
PROTHROMBIN TIME: 18.1 s — AB (ref 11.4–15.2)

## 2017-02-15 LAB — HEPARIN LEVEL (UNFRACTIONATED): HEPARIN UNFRACTIONATED: 0.45 [IU]/mL (ref 0.30–0.70)

## 2017-02-15 MED ORDER — WARFARIN SODIUM 7.5 MG PO TABS
15.0000 mg | ORAL_TABLET | Freq: Once | ORAL | Status: AC
  Administered 2017-02-15: 15 mg via ORAL
  Filled 2017-02-15: qty 2

## 2017-02-15 NOTE — Plan of Care (Signed)
Problem: Safety: Goal: Ability to remain free from injury will improve Outcome: Progressing Patient able to ambulate safetly

## 2017-02-15 NOTE — Progress Notes (Signed)
ANTICOAGULATION CONSULT NOTE - Follow Up Consult  Pharmacy Consult for Heparin, Coumadin Indication: pulmonary embolus  Allergies  Allergen Reactions  . Bactrim Anaphylaxis and Hives  . Prednisone Anaphylaxis and Hives    Has been on Dexamethasone without issue.  . Nsaids Other (See Comments)    Has Lupus, reccommended to avoid NSAIDs  . Other Itching    Shrimp:throat itching "can eat other shellfish"  . Pineapple Itching  . Procardia [Nifedipine] Swelling    Swelling of tongue  . Vicodin [Hydrocodone-Acetaminophen] Hives and Swelling    Can take Percocet without difficulty    Patient Measurements: Height: 5\' 8"  (172.7 cm) Weight: (!) 331 lb 14.4 oz (150.5 kg) IBW/kg (Calculated) : 63.9 Heparin Dosing Weight:  101 kg  Vital Signs: Temp: 97.8 F (36.6 C) (07/26 0500) Temp Source: Oral (07/26 0500) BP: 142/61 (07/26 0500) Pulse Rate: 71 (07/26 0500)  Labs:  Recent Labs  02/13/17 0234 02/14/17 0228 02/14/17 2303 02/15/17 0221  HGB 12.1 11.7*  --  11.5*  HCT 37.5 36.6  --  35.5*  PLT 341 356  --  337  LABPROT 14.5 16.4*  --  18.1*  INR 1.12 1.31  --  1.48  HEPARINUNFRC 0.58 0.53 0.41 0.45    Estimated Creatinine Clearance: 161.3 mL/min (by C-G formula based on SCr of 0.8 mg/dL).   Assessment: Day #6 overlap  Anticoag: Heparin/Coumadin for PE (acute vs residual PE), ?failed Eliquis, Doppler negative for DVT.  Hematomas under skin, per Heme, do NOT try Lovenox, could consider Arixtra, if APLA significant can add ASA. HL 0.45, INR 1.48, CBC stable. - Initial lupus anticoagulant + (6/8), outpt eval for underlying coagulation disorder  Goal of Therapy:  Heparin level 0.3-0.7 units/ml  INR 2-3 Monitor platelets by anticoagulation protocol: Yes   Plan:  Continue heparin gtt at 1600 units/hr Coumadin 15mg  PO today Daily heparin level, PT / INR and CBC Home when INR therapeutic   Antino Mayabb S. Merilynn Finlandobertson, PharmD, BCPS Clinical Staff Pharmacist Pager  [phone]  Misty Stanleyobertson, Jobina Maita Stillinger 02/15/2017,1:54 PM

## 2017-02-15 NOTE — Progress Notes (Signed)
ANTICOAGULATION CONSULT NOTE - Follow Up Consult  Pharmacy Consult:  Heparin/Coumadin (Overlap D#5/5) Indication: pulmonary embolus  Allergies  Allergen Reactions  . Bactrim Anaphylaxis and Hives  . Prednisone Anaphylaxis and Hives    Has been on Dexamethasone without issue.  . Nsaids Other (See Comments)    Has Lupus, reccommended to avoid NSAIDs  . Other Itching    Shrimp:throat itching "can eat other shellfish"  . Pineapple Itching  . Procardia [Nifedipine] Swelling    Swelling of tongue  . Vicodin [Hydrocodone-Acetaminophen] Hives and Swelling    Can take Percocet without difficulty    Patient Measurements: Height: 5\' 8"  (172.7 cm) Weight: (!) 331 lb 14.4 oz (150.5 kg) IBW/kg (Calculated) : 63.9  Heparin dosing weight  = 101 kg  Vital Signs: Temp: 98.4 F (36.9 C) (07/25 2051) Temp Source: Axillary (07/25 2051) BP: 129/81 (07/25 2051) Pulse Rate: 82 (07/25 2051)  Labs:  Recent Labs  02/12/17 0231  02/12/17 [id number] 02/13/17 0234 02/14/17 0228 02/14/17 2303  HGB  --   < > 12.1 12.1 11.7*  --   HCT  --   --  38.1 37.5 36.6  --   PLT  --   --  363 341 356  --   LABPROT 14.4  --   --  14.5 16.4*  --   INR 1.11  --   --  1.12 1.31  --   HEPARINUNFRC 0.60  --   --  0.58 0.53 0.41  < > = values in this interval not displayed.  Estimated Creatinine Clearance: 161.3 mL/min (by C-G formula based on SCr of 0.8 mg/dL).  Assessment: 31 YOF on Eliquis PTA for history of PE that was diagnosed on 12/30/2016. She was initially on Lovenox for several weeks and then switched to Eliquis after a hematology consult. Patient admitted with chest pain and "knot in foot."  CTA positive for a small PE (unsure if acute or residual from PE in June).  Doppler negative for DVT.  Patient was transitioned to Coumadin and IV heparin bridge.  Heparin level is therapeutic and INR remains sub-therapeutic at 1.31 today, despite large doses of Coumadin. H&H, pltc stable. No bleeding  reported.  Heparin level therapeutic: 0.41 - IV access lost earlier today for ~ 1 hour; morning labs to be collected in a few hours and will access INR and other labs   Goal of Therapy:  Heparin level 0.3-0.7 units/ml INR goal: 2-3 Monitor platelets by anticoagulation protocol: Yes    Plan:  Continue heparin gtt at 1600 units/hr Daily heparin level, PT / INR and CBC   Hayley Webb, PharmD Clinical Pharmacist 02/15/2017 1:05 AM

## 2017-02-15 NOTE — Progress Notes (Addendum)
PROGRESS NOTE    Hayley Webb   ZOX:[id number]  DOB: 11/20/1985  DOA: 02/09/2017 PCP: Garnetta BuddyEnglish, Stephanie D, PA   Brief Narrative:  Hayley Webb  is a 31 y.o.femalewith a past medical history significant for SLE with APLA on Eliquis, morbid obesity, and mild asthmawho presents with chest pain who was found to have new PE while on eliquis. She has failed treatment with xarelto and eliquis and has been started on IV heparin and coumadin. Oncology consulted on admission and recommended referral to tertiary care for anti coagulation specialist on discharge.    Subjective:  left chest pain is still present- only applying Voltaren BID rather that QID as I ordered. I have explained that it is a QID medication. Reminded that she can ask for Tylenol as well. No new complaints.  ROS: no complaints of nausea, vomiting, constipation diarrhea, cough, dyspnea or dysuria. No other complaints.   Assessment & Plan:   Principal Problem:   Pulmonary embolus - cont Heparin and Coumadin until INR therapeutic - outpt eval for underlying coagulation disorder  Active Problems: Chest pain- left sided musculoskeletal - is reproducible- Voltaren gel ordered- Tylenol PRN    Asthma - stable   Lupus/   Antiphospholipid antibody syndrome  - cont Plaquenil and steroids  Abnormal UA - UA was bloody ( may be due to being on Heparin) and had a small amount of leukocytes and many bacteria  - given Keflex for 4 days - > 100,000 lactobacillus isolated but patient is asymptomatic    DVT prophylaxis: Heparin Code Status: Full Disposition Plan: home when INR therapeutic Consultants:   hematology Procedures:    Antimicrobials:  Anti-infectives    Start     Dose/Rate Route Frequency Ordered Stop   02/11/17 1645  cephALEXin (KEFLEX) capsule 500 mg  Status:  Discontinued     500 mg Oral Every 8 hours 02/11/17 1640 02/14/17 1543   02/10/17 1000  hydroxychloroquine (PLAQUENIL) tablet 200 mg     200 mg  Oral Daily 02/10/17 0134         Objective: Vitals:   02/14/17 1425 02/14/17 2051 02/15/17 0500 02/15/17 1403  BP: (!) 134/91 129/81 (!) 142/61 139/87  Pulse: 77 82 71 80  Resp:    16  Temp: 97.6 F (36.4 C) 98.4 F (36.9 C) 97.8 F (36.6 C) 98.1 F (36.7 C)  TempSrc: Axillary Axillary Oral Oral  SpO2: 100% 98% 100% 100%  Weight:   (!) 150.5 kg (331 lb 14.4 oz)   Height:        Intake/Output Summary (Last 24 hours) at 02/15/17 1431 Last data filed at 02/15/17 1200  Gross per 24 hour  Intake           1465.8 ml  Output              300 ml  Net           1165.8 ml   Filed Weights   02/13/17 0512 02/14/17 0357 02/15/17 0500  Weight: (!) 150.4 kg (331 lb 8 oz) (!) 150.5 kg (331 lb 14.4 oz) (!) 150.5 kg (331 lb 14.4 oz)    Examination: General exam: Appears comfortable  HEENT: PERRLA, oral mucosa moist, no sclera icterus or thrush Respiratory system: Clear to auscultation. Respiratory effort normal. Chest wall: tenderness in left chest wall Cardiovascular system: S1 & S2 heard, RRR.  No murmurs  Gastrointestinal system: Abdomen soft, non-tender, nondistended. Normal bowel sound. No organomegaly Central nervous system: Alert and oriented. No  focal neurological deficits. Extremities: No cyanosis, clubbing or edema Skin: No rashes or ulcers Psychiatry:  Mood & affect appropriate.     Data Reviewed: I have personally reviewed following labs and imaging studies  CBC:  Recent Labs Lab 02/09/17 2026 02/12/17 0811 02/13/17 0234 02/14/17 0228 02/15/17 0221  WBC 6.3 6.5 7.4 7.3 7.9  HGB 11.9* 12.1 12.1 11.7* 11.5*  HCT 35.9* 38.1 37.5 36.6 35.5*  MCV 83.1 82.3 83.7 82.4 82.6  PLT 384 363 341 356 337   Basic Metabolic Panel:  Recent Labs Lab 02/09/17 2026  NA 135  K 3.8  CL 101  CO2 26  GLUCOSE 115*  BUN 15  CREATININE 0.80  CALCIUM 9.3   GFR: Estimated Creatinine Clearance: 161.3 mL/min (by C-G formula based on SCr of 0.8 mg/dL). Liver Function  Tests: No results for input(s): AST, ALT, ALKPHOS, BILITOT, PROT, ALBUMIN in the last 168 hours. No results for input(s): LIPASE, AMYLASE in the last 168 hours. No results for input(s): AMMONIA in the last 168 hours. Coagulation Profile:  Recent Labs Lab 02/11/17 0336 02/12/17 0231 02/13/17 0234 02/14/17 0228 02/15/17 0221  INR 1.07 1.11 1.12 1.31 1.48   Cardiac Enzymes:  Recent Labs Lab 02/09/17 2026  TROPONINI <0.03   BNP (last 3 results) No results for input(s): PROBNP in the last 8760 hours. HbA1C: No results for input(s): HGBA1C in the last 72 hours. CBG: No results for input(s): GLUCAP in the last 168 hours. Lipid Profile: No results for input(s): CHOL, HDL, LDLCALC, TRIG, CHOLHDL, LDLDIRECT in the last 72 hours. Thyroid Function Tests: No results for input(s): TSH, T4TOTAL, FREET4, T3FREE, THYROIDAB in the last 72 hours. Anemia Panel: No results for input(s): VITAMINB12, FOLATE, FERRITIN, TIBC, IRON, RETICCTPCT in the last 72 hours. Urine analysis:    Component Value Date/Time   COLORURINE YELLOW 02/11/2017 1348   APPEARANCEUR HAZY (A) 02/11/2017 1348   LABSPEC 1.030 02/11/2017 1348   PHURINE 6.0 02/11/2017 1348   GLUCOSEU NEGATIVE 02/11/2017 1348   HGBUR LARGE (A) 02/11/2017 1348   BILIRUBINUR NEGATIVE 02/11/2017 1348   KETONESUR NEGATIVE 02/11/2017 1348   PROTEINUR 30 (A) 02/11/2017 1348   UROBILINOGEN 1.0 04/28/2015 1930   NITRITE NEGATIVE 02/11/2017 1348   LEUKOCYTESUR SMALL (A) 02/11/2017 1348   Sepsis Labs: @LABRCNTIP (procalcitonin:4,lacticidven:4) ) Recent Results (from the past 240 hour(s))  Culture, Urine     Status: Abnormal   Collection Time: 02/11/17  1:52 PM  Result Value Ref Range Status   Specimen Description URINE, RANDOM  Final   Special Requests NONE  Final   Culture (A)  Final    >=100,000 COLONIES/mL LACTOBACILLUS SPECIES Standardized susceptibility testing for this organism is not available.    Report Status 02/12/2017 FINAL   Final         Radiology Studies: No results found.    Scheduled Meds: . diclofenac sodium  2 g Topical QID  . hydroxychloroquine  200 mg Oral Daily  . methylPREDNISolone  4 mg Oral Daily  . senna  1 tablet Oral BID  . warfarin  15 mg Oral ONCE-1800  . Warfarin - Pharmacist Dosing Inpatient   Does not apply q1800   Continuous Infusions: . heparin 1,600 Units/hr (02/15/17 1400)     LOS: 6 days    Time spent in minutes: 35    Calvert CantorSaima Ziona Wickens, MD [facility] Pager: [email or web address] Password [email or web address] 02/15/2017, 2:31 PM

## 2017-02-16 LAB — PROTIME-INR
INR: 1.71
Prothrombin Time: 20.2 seconds — ABNORMAL HIGH (ref 11.4–15.2)

## 2017-02-16 LAB — HEPARIN LEVEL (UNFRACTIONATED): HEPARIN UNFRACTIONATED: 0.51 [IU]/mL (ref 0.30–0.70)

## 2017-02-16 MED ORDER — WARFARIN SODIUM 7.5 MG PO TABS
15.0000 mg | ORAL_TABLET | Freq: Once | ORAL | Status: AC
  Administered 2017-02-16: 15 mg via ORAL
  Filled 2017-02-16: qty 2

## 2017-02-16 NOTE — Progress Notes (Signed)
ANTICOAGULATION CONSULT NOTE - Follow Up Consult  Pharmacy Consult:  Heparin/Coumadin (Overlap D#5/5) Indication: pulmonary embolus  Allergies  Allergen Reactions  . Bactrim Anaphylaxis and Hives  . Prednisone Anaphylaxis and Hives    Has been on Dexamethasone without issue.  . Nsaids Other (See Comments)    Has Lupus, reccommended to avoid NSAIDs  . Other Itching    Shrimp:throat itching "can eat other shellfish"  . Pineapple Itching  . Procardia [Nifedipine] Swelling    Swelling of tongue  . Vicodin [Hydrocodone-Acetaminophen] Hives and Swelling    Can take Percocet without difficulty    Patient Measurements: Height: 5\' 8"  (172.7 cm) Weight: (!) 330 lb 12.8 oz (150 kg) IBW/kg (Calculated) : 63.9  Heparin dosing weight  = 101 kg  Vital Signs: Temp: 98 F (36.7 C) (07/27 0605) Temp Source: Oral (07/27 0605) BP: 130/77 (07/27 0605) Pulse Rate: 67 (07/27 0605)  Labs:  Recent Labs  02/14/17 0228 02/14/17 2303 02/15/17 0221 02/16/17 0253  HGB 11.7*  --  11.5*  --   HCT 36.6  --  35.5*  --   PLT 356  --  337  --   LABPROT 16.4*  --  18.1* 20.2*  INR 1.31  --  1.48 1.71  HEPARINUNFRC 0.53 0.41 0.45 0.51    Estimated Creatinine Clearance: 161.2 mL/min (by C-G formula based on SCr of 0.8 mg/dL).  Assessment: 31 YOF on Eliquis PTA for history of PE that was diagnosed on 12/30/2016. She was initially on Lovenox for several weeks and then switched to Eliquis after a hematology consult. Patient admitted with chest pain and "knot in foot."  CTA positive for a small PE (unsure if acute or residual from PE in June).  Doppler negative for DVT.  Patient was transitioned to Coumadin and IV heparin bridge.  Heparin level is therapeutic and INR remains sub-therapeutic at 1.71 today; Increasing slowly despite large doses. H&H, pltc stable. No bleeding reported.  Heparin level therapeutic: 0.51   Goal of Therapy:  Heparin level 0.3-0.7 units/ml INR goal: 2-3 Monitor platelets  by anticoagulation protocol: Yes    Plan:  Continue heparin gtt at 1600 units/hr Warfarin 15mg  PO x 1 Daily heparin level, PT / INR and CBC   Hayley PoseyJonathan Mikias Webb, PharmD Pharmacy Resident Pager #: [phone] 02/16/2017 9:12 AM

## 2017-02-16 NOTE — Care Management Note (Signed)
Case Management Note  Patient Details  Name: Hayley Webb MRN: [id number] Date of Birth: 11-10-1985  Subjective/Objective:  Pt presented for Chest Pain found to have PE while on Eliquis. Pt continues on IV Heparin Coumadin Bridge until therapeutic INR.                   Action/Plan: No home needs identified at this time.   Expected Discharge Date:                  Expected Discharge Plan:  Home/Self Care  In-House Referral:  NA  Discharge planning Services  CM Consult  Post Acute Care Choice:  NA Choice offered to:  NA  DME Arranged:  N/A DME Agency:  NA  HH Arranged:  NA HH Agency:  NA  Status of Service:  Completed, signed off  If discussed at Long Length of Stay Meetings, dates discussed:  02-13-17, 02-15-17  Additional Comments:  Gala LewandowskyGraves-Bigelow, Janace Decker Kaye, RN 02/16/2017, 12:50 PM

## 2017-02-16 NOTE — Progress Notes (Signed)
PROGRESS NOTE    Hayley Webb   ZOX:[id number]  DOB: 07-23-1986  DOA: 02/09/2017 PCP: Garnetta BuddyEnglish, Stephanie D, PA   Brief Narrative:  Hayley Webb  is a 31 y.o.femalewith a past medical history significant for SLE with APLA on Eliquis, morbid obesity, and mild asthmawho presents with chest pain who was found to have new PE while on eliquis. She has failed treatment with xarelto and eliquis and has been started on IV heparin and coumadin. Oncology consulted on admission and recommended referral to tertiary care for anti coagulation specialist on discharge.    Subjective: Left chest wall pain is less severe today but is moving more to the right chest wall.  ROS: no complaints of nausea, vomiting, constipation diarrhea, cough, dyspnea or dysuria. No other complaints.   Assessment & Plan:   Principal Problem:   Pulmonary embolus - cont Heparin and Coumadin until INR therapeutic - outpt eval for underlying coagulation disorder  Active Problems: Chest pain- left sided musculoskeletal - is reproducible- Voltaren gel ordered- Tylenol PRN    Asthma - stable   Lupus/   Antiphospholipid antibody syndrome  - cont Plaquenil and steroids  Abnormal UA - UA was bloody ( may be due to being on Heparin) and had a small amount of leukocytes and many bacteria  - given Keflex for 4 days - > 100,000 lactobacillus isolated but patient is asymptomatic    DVT prophylaxis: Heparin Code Status: Full Disposition Plan: home when INR therapeutic Consultants:   hematology Procedures:    Antimicrobials:  Anti-infectives    Start     Dose/Rate Route Frequency Ordered Stop   02/11/17 1645  cephALEXin (KEFLEX) capsule 500 mg  Status:  Discontinued     500 mg Oral Every 8 hours 02/11/17 1640 02/14/17 1543   02/10/17 1000  hydroxychloroquine (PLAQUENIL) tablet 200 mg     200 mg Oral Daily 02/10/17 0134         Objective: Vitals:   02/15/17 0500 02/15/17 1403 02/15/17 2124 02/16/17 0605    BP: (!) 142/61 139/87 140/81 130/77  Pulse: 71 80 72 67  Resp:  16 16 16   Temp: 97.8 F (36.6 C) 98.1 F (36.7 C) 97.8 F (36.6 C) 98 F (36.7 C)  TempSrc: Oral Oral Oral Oral  SpO2: 100% 100% 100% 100%  Weight: (!) 150.5 kg (331 lb 14.4 oz)   (!) 150 kg (330 lb 12.8 oz)  Height:        Intake/Output Summary (Last 24 hours) at 02/16/17 1346 Last data filed at 02/16/17 0700  Gross per 24 hour  Intake           758.07 ml  Output              500 ml  Net           258.07 ml   Filed Weights   02/14/17 0357 02/15/17 0500 02/16/17 0605  Weight: (!) 150.5 kg (331 lb 14.4 oz) (!) 150.5 kg (331 lb 14.4 oz) (!) 150 kg (330 lb 12.8 oz)    Examination: General exam: Appears comfortable  HEENT: PERRLA, oral mucosa moist, no sclera icterus or thrush Respiratory system: Clear to auscultation. Respiratory effort normal. Chest wall: tenderness in left chest wall Cardiovascular system: S1 & S2 heard, RRR.  No murmurs  Gastrointestinal system: Abdomen soft, non-tender, nondistended. Normal bowel sound. No organomegaly Central nervous system: Alert and oriented. No focal neurological deficits. Extremities: No cyanosis, clubbing or edema Skin: No rashes or ulcers Psychiatry:  Mood & affect appropriate.     Data Reviewed: I have personally reviewed following labs and imaging studies  CBC:  Recent Labs Lab 02/09/17 2026 02/12/17 0811 02/13/17 0234 02/14/17 0228 02/15/17 0221  WBC 6.3 6.5 7.4 7.3 7.9  HGB 11.9* 12.1 12.1 11.7* 11.5*  HCT 35.9* 38.1 37.5 36.6 35.5*  MCV 83.1 82.3 83.7 82.4 82.6  PLT 384 363 341 356 337   Basic Metabolic Panel:  Recent Labs Lab 02/09/17 2026  NA 135  K 3.8  CL 101  CO2 26  GLUCOSE 115*  BUN 15  CREATININE 0.80  CALCIUM 9.3   GFR: Estimated Creatinine Clearance: 161.2 mL/min (by C-G formula based on SCr of 0.8 mg/dL). Liver Function Tests: No results for input(s): AST, ALT, ALKPHOS, BILITOT, PROT, ALBUMIN in the last 168 hours. No  results for input(s): LIPASE, AMYLASE in the last 168 hours. No results for input(s): AMMONIA in the last 168 hours. Coagulation Profile:  Recent Labs Lab 02/12/17 0231 02/13/17 0234 02/14/17 0228 02/15/17 0221 02/16/17 0253  INR 1.11 1.12 1.31 1.48 1.71   Cardiac Enzymes:  Recent Labs Lab 02/09/17 2026  TROPONINI <0.03   BNP (last 3 results) No results for input(s): PROBNP in the last 8760 hours. HbA1C: No results for input(s): HGBA1C in the last 72 hours. CBG: No results for input(s): GLUCAP in the last 168 hours. Lipid Profile: No results for input(s): CHOL, HDL, LDLCALC, TRIG, CHOLHDL, LDLDIRECT in the last 72 hours. Thyroid Function Tests: No results for input(s): TSH, T4TOTAL, FREET4, T3FREE, THYROIDAB in the last 72 hours. Anemia Panel: No results for input(s): VITAMINB12, FOLATE, FERRITIN, TIBC, IRON, RETICCTPCT in the last 72 hours. Urine analysis:    Component Value Date/Time   COLORURINE YELLOW 02/11/2017 1348   APPEARANCEUR HAZY (A) 02/11/2017 1348   LABSPEC 1.030 02/11/2017 1348   PHURINE 6.0 02/11/2017 1348   GLUCOSEU NEGATIVE 02/11/2017 1348   HGBUR LARGE (A) 02/11/2017 1348   BILIRUBINUR NEGATIVE 02/11/2017 1348   KETONESUR NEGATIVE 02/11/2017 1348   PROTEINUR 30 (A) 02/11/2017 1348   UROBILINOGEN 1.0 04/28/2015 1930   NITRITE NEGATIVE 02/11/2017 1348   LEUKOCYTESUR SMALL (A) 02/11/2017 1348   Sepsis Labs: @LABRCNTIP (procalcitonin:4,lacticidven:4) ) Recent Results (from the past 240 hour(s))  Culture, Urine     Status: Abnormal   Collection Time: 02/11/17  1:52 PM  Result Value Ref Range Status   Specimen Description URINE, RANDOM  Final   Special Requests NONE  Final   Culture (A)  Final    >=100,000 COLONIES/mL LACTOBACILLUS SPECIES Standardized susceptibility testing for this organism is not available.    Report Status 02/12/2017 FINAL  Final         Radiology Studies: No results found.    Scheduled Meds: . diclofenac sodium   2 g Topical QID  . hydroxychloroquine  200 mg Oral Daily  . methylPREDNISolone  4 mg Oral Daily  . senna  1 tablet Oral BID  . warfarin  15 mg Oral ONCE-1800  . Warfarin - Pharmacist Dosing Inpatient   Does not apply q1800   Continuous Infusions: . heparin 1,600 Units/hr (02/15/17 2227)     LOS: 7 days    Time spent in minutes: 35    Calvert CantorSaima Rubens Cranston, MD [facility] Pager: [email or web address] Password [email or web address] 02/16/2017, 1:46 PM

## 2017-02-16 NOTE — Plan of Care (Signed)
Problem: Pain Managment: Goal: General experience of comfort will improve Outcome: Not Progressing Continuing to have CP, medicated with percocet and morphine

## 2017-02-17 LAB — HEPARIN LEVEL (UNFRACTIONATED): Heparin Unfractionated: 0.52 IU/mL (ref 0.30–0.70)

## 2017-02-17 LAB — PROTIME-INR
INR: 1.84
Prothrombin Time: 21.5 seconds — ABNORMAL HIGH (ref 11.4–15.2)

## 2017-02-17 MED ORDER — WARFARIN SODIUM 7.5 MG PO TABS
15.0000 mg | ORAL_TABLET | Freq: Once | ORAL | Status: AC
  Administered 2017-02-17: 15 mg via ORAL
  Filled 2017-02-17: qty 2

## 2017-02-17 MED ORDER — TRAMADOL HCL 50 MG PO TABS
100.0000 mg | ORAL_TABLET | Freq: Four times a day (QID) | ORAL | Status: DC | PRN
  Administered 2017-02-17 – 2017-02-19 (×4): 100 mg via ORAL
  Filled 2017-02-17 (×4): qty 2

## 2017-02-17 NOTE — Progress Notes (Signed)
RT note:  [facility] for feeling short of breath and requesting to be placed on oxygen. Sats are 100%, patient states she feels better now with [place] on and she is resting comfortably. RT will continue to monitor.

## 2017-02-17 NOTE — Progress Notes (Signed)
PROGRESS NOTE    Hayley Webb   ZOX:[id number]  DOB: 03/14/1986  DOA: 02/09/2017 PCP: Garnetta BuddyEnglish, Stephanie D, PA   Brief Narrative:  Hayley Webb  is a 31 y.o.femalewith a past medical history significant for SLE with APLA on Eliquis, morbid obesity, and mild asthmawho presents with chest pain who was found to have new PE while on eliquis. She has failed treatment with xarelto and eliquis and has been started on IV heparin and coumadin. Oncology consulted on admission and recommended referral to tertiary care for anti coagulation specialist on discharge.    Subjective: Now having pain in left upper breast around to her upper back when she walks- Morphine and Oxycodone help this to resolve but make her sleepy ROS: no complaints of nausea, vomiting, constipation diarrhea, cough, dyspnea or dysuria. No other complaints.   Assessment & Plan:   Principal Problem:   Pulmonary embolus - cont Heparin and Coumadin until INR therapeutic - outpt eval for underlying coagulation disorder  Active Problems: Chest pain- left sided musculoskeletal - part of it is reproducible- Voltaren gel ordered- Tylenol PRN\ - cont Morphine/Oxycodone for now for other chest pani which may be pleuritic    Asthma - stable   Lupus/   Antiphospholipid antibody syndrome  - cont Plaquenil and steroids  Abnormal UA - UA was bloody ( may be due to being on Heparin) and had a small amount of leukocytes and many bacteria  - given Keflex for 4 days - > 100,000 lactobacillus isolated but patient is asymptomatic    DVT prophylaxis: Heparin Code Status: Full Disposition Plan: home when INR therapeutic Consultants:   hematology Procedures:    Antimicrobials:  Anti-infectives    Start     Dose/Rate Route Frequency Ordered Stop   02/11/17 1645  cephALEXin (KEFLEX) capsule 500 mg  Status:  Discontinued     500 mg Oral Every 8 hours 02/11/17 1640 02/14/17 1543   02/10/17 1000  hydroxychloroquine (PLAQUENIL)  tablet 200 mg     200 mg Oral Daily 02/10/17 0134         Objective: Vitals:   02/16/17 1300 02/16/17 1700 02/16/17 2047 02/17/17 0500  BP: (!) 144/84  129/88 129/79  Pulse: 69 69 81 77  Resp: 20 20 20 20   Temp: (!) 97.4 F (36.3 C) (!) 97.4 F (36.3 C) 98.5 F (36.9 C) 97.8 F (36.6 C)  TempSrc: Oral Oral Oral Oral  SpO2: 99% 99% 99% 100%  Weight:    (!) 150.1 kg (330 lb 14.4 oz)  Height:        Intake/Output Summary (Last 24 hours) at 02/17/17 1131 Last data filed at 02/17/17 0800  Gross per 24 hour  Intake              400 ml  Output                0 ml  Net              400 ml   Filed Weights   02/15/17 0500 02/16/17 0605 02/17/17 0500  Weight: (!) 150.5 kg (331 lb 14.4 oz) (!) 150 kg (330 lb 12.8 oz) (!) 150.1 kg (330 lb 14.4 oz)    Examination: General exam: Appears comfortable  HEENT: PERRLA, oral mucosa moist, no sclera icterus or thrush Respiratory system: Clear to auscultation. Respiratory effort normal. Chest wall: tenderness in left chest wall Cardiovascular system: S1 & S2 heard, RRR.  No murmurs  Gastrointestinal system: Abdomen soft, non-tender, nondistended.  Normal bowel sound. No organomegaly Central nervous system: Alert and oriented. No focal neurological deficits. Extremities: No cyanosis, clubbing or edema Skin: No rashes or ulcers Psychiatry:  Mood & affect appropriate.     Data Reviewed: I have personally reviewed following labs and imaging studies  CBC:  Recent Labs Lab 02/12/17 0811 02/13/17 0234 02/14/17 0228 02/15/17 0221  WBC 6.5 7.4 7.3 7.9  HGB 12.1 12.1 11.7* 11.5*  HCT 38.1 37.5 36.6 35.5*  MCV 82.3 83.7 82.4 82.6  PLT 363 341 356 337   Basic Metabolic Panel: No results for input(s): NA, K, CL, CO2, GLUCOSE, BUN, CREATININE, CALCIUM, MG, PHOS in the last 168 hours. GFR: Estimated Creatinine Clearance: 161.2 mL/min (by C-G formula based on SCr of 0.8 mg/dL). Liver Function Tests: No results for input(s): AST, ALT,  ALKPHOS, BILITOT, PROT, ALBUMIN in the last 168 hours. No results for input(s): LIPASE, AMYLASE in the last 168 hours. No results for input(s): AMMONIA in the last 168 hours. Coagulation Profile:  Recent Labs Lab 02/13/17 0234 02/14/17 0228 02/15/17 0221 02/16/17 0253 02/17/17 0214  INR 1.12 1.31 1.48 1.71 1.84   Cardiac Enzymes: No results for input(s): CKTOTAL, CKMB, CKMBINDEX, TROPONINI in the last 168 hours. BNP (last 3 results) No results for input(s): PROBNP in the last 8760 hours. HbA1C: No results for input(s): HGBA1C in the last 72 hours. CBG: No results for input(s): GLUCAP in the last 168 hours. Lipid Profile: No results for input(s): CHOL, HDL, LDLCALC, TRIG, CHOLHDL, LDLDIRECT in the last 72 hours. Thyroid Function Tests: No results for input(s): TSH, T4TOTAL, FREET4, T3FREE, THYROIDAB in the last 72 hours. Anemia Panel: No results for input(s): VITAMINB12, FOLATE, FERRITIN, TIBC, IRON, RETICCTPCT in the last 72 hours. Urine analysis:    Component Value Date/Time   COLORURINE YELLOW 02/11/2017 1348   APPEARANCEUR HAZY (A) 02/11/2017 1348   LABSPEC 1.030 02/11/2017 1348   PHURINE 6.0 02/11/2017 1348   GLUCOSEU NEGATIVE 02/11/2017 1348   HGBUR LARGE (A) 02/11/2017 1348   BILIRUBINUR NEGATIVE 02/11/2017 1348   KETONESUR NEGATIVE 02/11/2017 1348   PROTEINUR 30 (A) 02/11/2017 1348   UROBILINOGEN 1.0 04/28/2015 1930   NITRITE NEGATIVE 02/11/2017 1348   LEUKOCYTESUR SMALL (A) 02/11/2017 1348   Sepsis Labs: @LABRCNTIP (procalcitonin:4,lacticidven:4) ) Recent Results (from the past 240 hour(s))  Culture, Urine     Status: Abnormal   Collection Time: 02/11/17  1:52 PM  Result Value Ref Range Status   Specimen Description URINE, RANDOM  Final   Special Requests NONE  Final   Culture (A)  Final    >=100,000 COLONIES/mL LACTOBACILLUS SPECIES Standardized susceptibility testing for this organism is not available.    Report Status 02/12/2017 FINAL  Final          Radiology Studies: No results found.    Scheduled Meds: . hydroxychloroquine  200 mg Oral Daily  . methylPREDNISolone  4 mg Oral Daily  . senna  1 tablet Oral BID  . warfarin  15 mg Oral ONCE-1800  . Warfarin - Pharmacist Dosing Inpatient   Does not apply q1800   Continuous Infusions: . heparin 1,600 Units/hr (02/17/17 0800)     LOS: 8 days    Time spent in minutes: 35    Calvert CantorSaima Maddex Garlitz, MD [facility] Pager: [email or web address] Password [email or web address] 02/17/2017, 11:31 AM

## 2017-02-17 NOTE — Progress Notes (Addendum)
Patient having 9/10 left sided chest pain, unrelieved by PRN pain medications. States she is having trouble breathing. 12 lead EKG was done. SpO2 was 98% on RA but patient still requested oxygen. Her respiratory assessment was WDL. Dr. Butler Denmarkizwan paged.

## 2017-02-17 NOTE — Progress Notes (Signed)
ANTICOAGULATION CONSULT NOTE - Follow Up Consult  Pharmacy Consult:  Heparin/Coumadin (Overlap D#5/5) Indication: pulmonary embolus  Allergies  Allergen Reactions  . Bactrim Anaphylaxis and Hives  . Prednisone Anaphylaxis and Hives    Has been on Dexamethasone without issue.  . Nsaids Other (See Comments)    Has Lupus, reccommended to avoid NSAIDs  . Other Itching    Shrimp:throat itching "can eat other shellfish"  . Pineapple Itching  . Procardia [Nifedipine] Swelling    Swelling of tongue  . Vicodin [Hydrocodone-Acetaminophen] Hives and Swelling    Can take Percocet without difficulty    Patient Measurements: Height: 5\' 8"  (172.7 cm) Weight: (!) 330 lb 14.4 oz (150.1 kg) IBW/kg (Calculated) : 63.9  Heparin dosing weight  = 101 kg  Vital Signs: Temp: 97.8 F (36.6 C) (07/28 0500) Temp Source: Oral (07/28 0500) BP: 129/79 (07/28 0500) Pulse Rate: 77 (07/28 0500)  Labs:  Recent Labs  02/15/17 0221 02/16/17 0253 02/17/17 0214  HGB 11.5*  --   --   HCT 35.5*  --   --   PLT 337  --   --   LABPROT 18.1* 20.2* 21.5*  INR 1.48 1.71 1.84  HEPARINUNFRC 0.45 0.51 0.52    Estimated Creatinine Clearance: 161.2 mL/min (by C-G formula based on SCr of 0.8 mg/dL).  Assessment: 31 YOF on Eliquis PTA for history of PE that was diagnosed on 12/30/2016. She was initially on Lovenox for several weeks and then switched to Eliquis after a hematology consult. Patient admitted with chest pain and "knot in foot."  CTA positive for a small PE (unsure if acute or residual from PE in June).  Doppler negative for DVT.  Patient was transitioned to Coumadin and IV heparin bridge.  Heparin level is therapeutic and INR remains sub-therapeutic at 1.84 today; Increasing slowly despite large doses. H&H, pltc stable. No bleeding reported.  Heparin level therapeutic: 0.52  Goal of Therapy:  Heparin level 0.3-0.7 units/ml INR goal: 2-3 Monitor platelets by anticoagulation protocol: Yes     Plan:  Continue heparin gtt at 1600 units/hr Warfarin 15mg  PO x 1 Daily heparin level, PT / INR and CBC, s/s bleeding   Daylene PoseyJonathan Shaylin Blatt, PharmD Pharmacy Resident Pager #: [phone] 02/17/2017 10:01 AM

## 2017-02-18 ENCOUNTER — Inpatient Hospital Stay (HOSPITAL_COMMUNITY): Payer: 59

## 2017-02-18 ENCOUNTER — Encounter (HOSPITAL_COMMUNITY): Payer: Self-pay

## 2017-02-18 LAB — HEPARIN LEVEL (UNFRACTIONATED): Heparin Unfractionated: 0.45 IU/mL (ref 0.30–0.70)

## 2017-02-18 LAB — CBC
HCT: 37.1 % (ref 36.0–46.0)
Hemoglobin: 11.9 g/dL — ABNORMAL LOW (ref 12.0–15.0)
MCH: 26.4 pg (ref 26.0–34.0)
MCHC: 32.1 g/dL (ref 30.0–36.0)
MCV: 82.4 fL (ref 78.0–100.0)
PLATELETS: 321 10*3/uL (ref 150–400)
RBC: 4.5 MIL/uL (ref 3.87–5.11)
RDW: 14.4 % (ref 11.5–15.5)
WBC: 8.1 10*3/uL (ref 4.0–10.5)

## 2017-02-18 LAB — BASIC METABOLIC PANEL
Anion gap: 9 (ref 5–15)
BUN: 11 mg/dL (ref 6–20)
CHLORIDE: 98 mmol/L — AB (ref 101–111)
CO2: 28 mmol/L (ref 22–32)
CREATININE: 0.85 mg/dL (ref 0.44–1.00)
Calcium: 9.4 mg/dL (ref 8.9–10.3)
Glucose, Bld: 106 mg/dL — ABNORMAL HIGH (ref 65–99)
POTASSIUM: 4.2 mmol/L (ref 3.5–5.1)
SODIUM: 135 mmol/L (ref 135–145)

## 2017-02-18 LAB — PROTIME-INR
INR: 1.99
PROTHROMBIN TIME: 22.9 s — AB (ref 11.4–15.2)

## 2017-02-18 MED ORDER — IOPAMIDOL (ISOVUE-370) INJECTION 76%
INTRAVENOUS | Status: AC
  Administered 2017-02-18: 100 mL
  Filled 2017-02-18: qty 100

## 2017-02-18 MED ORDER — KETOROLAC TROMETHAMINE 30 MG/ML IJ SOLN
30.0000 mg | Freq: Four times a day (QID) | INTRAMUSCULAR | Status: DC
  Administered 2017-02-18 – 2017-02-19 (×5): 30 mg via INTRAVENOUS
  Filled 2017-02-18 (×6): qty 1

## 2017-02-18 MED ORDER — WARFARIN SODIUM 7.5 MG PO TABS
17.5000 mg | ORAL_TABLET | Freq: Once | ORAL | Status: AC
  Administered 2017-02-18: 17.5 mg via ORAL
  Filled 2017-02-18: qty 1

## 2017-02-18 NOTE — Progress Notes (Signed)
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 She has failed treatment with xarelto and eliquis and has been started on IV heparin and coumadin. Oncology consulted on admission and recommended referral to tertiary care for anti coagulation specialist on discharge.    Subjective: Continues to have pain going around to her left back.  ROS: no complaints of nausea, vomiting, constipation diarrhea, cough, dyspnea or dysuria. No other complaints.   Assessment & Plan:   Principal Problem:   Pulmonary embolus - cont Heparin and Coumadin until INR therapeutic - outpt eval for underlying coagulation disorder  Active Problems: Chest pain- left sided musculoskeletal - part of it is reproducible- Voltaren gel ordered- Tylenol PRN - cont Morphine/Oxycodone  -adding Toradol QID today as pain is worse    Asthma - stable   Lupus/   Antiphospholipid antibody syndrome  - cont Plaquenil and steroids  Abnormal UA - UA was bloody ( may be due to being on Heparin) and had a small amount of leukocytes and many bacteria  - given Keflex for 4 days - > 100,000 lactobacillus isolated but patient is asymptomatic    DVT prophylaxis: Heparin Code Status: Full Disposition Plan: home when INR therapeutic Consultants:   hematology Procedures:    Antimicrobials:  Anti-infectives    Start     Dose/Rate Route Frequency Ordered Stop   02/11/17 1645  cephALEXin (KEFLEX) capsule 500 mg  Status:  Discontinued     500 mg Oral Every 8 hours 02/11/17 1640 02/14/17 1543   02/10/17 1000  hydroxychloroquine (PLAQUENIL) tablet 200 mg     200 mg Oral Daily 02/10/17 0134         Objective: Vitals:   02/17/17  1645 02/17/17 2100 02/17/17 2329 02/18/17 0500  BP:  133/85 (!) 142/83 127/79  Pulse: 86 75 84 70  Resp: 18 18 16 16   Temp:  97.9 F (36.6 C)  97.8 F (36.6 C)  TempSrc:  Oral  Oral  SpO2: 100% 100% 100% 100%  Weight:    (!) 149.8 kg (330 lb 4.8 oz)  Height:        Intake/Output Summary (Last 24 hours) at 02/18/17 1323 Last data filed at 02/18/17 1037  Gross per 24 hour  Intake              592 ml  Output                0 ml  Net              592 ml   Filed Weights   02/16/17 0605 02/17/17 0500 02/18/17 0500  Weight: (!) 150 kg (330 lb 12.8 oz) (!) 150.1 kg (330 lb 14.4 oz) (!) 149.8 kg (330 lb 4.8 oz)    Examination: General exam: Appears comfortable  HEENT: PERRLA, oral mucosa moist, no sclera icterus or thrush Respiratory system: Clear to auscultation. Respiratory effort normal. Chest wall: tenderness in left chest wall Cardiovascular system: S1 & S2 heard, RRR.  No murmurs  Gastrointestinal system: Abdomen soft, non-tender, nondistended. Normal bowel sound. No organomegaly Central nervous system: Alert and oriented. No focal neurological deficits. Extremities: No cyanosis, clubbing or edema Skin: No rashes  or ulcers Psychiatry:  Mood & affect appropriate.     Data Reviewed: I have personally reviewed following labs and imaging studies  CBC:  Recent Labs Lab 02/12/17 0811 02/13/17 0234 02/14/17 0228 02/15/17 0221 02/18/17 0227  WBC 6.5 7.4 7.3 7.9 8.1  HGB 12.1 12.1 11.7* 11.5* 11.9*  HCT 38.1 37.5 36.6 35.5* 37.1  MCV 82.3 83.7 82.4 82.6 82.4  PLT 363 341 356 337 321   Basic Metabolic Panel: No results for input(s): NA, K, CL, CO2, GLUCOSE, BUN, CREATININE, CALCIUM, MG, PHOS in the last 168 hours. GFR: Estimated Creatinine Clearance: 161 mL/min (by C-G formula based on SCr of 0.8 mg/dL). Liver Function Tests: No results for input(s): AST, ALT, ALKPHOS, BILITOT, PROT, ALBUMIN in the last 168 hours. No results for input(s): LIPASE, AMYLASE in the last  168 hours. No results for input(s): AMMONIA in the last 168 hours. Coagulation Profile:  Recent Labs Lab 02/14/17 0228 02/15/17 0221 02/16/17 0253 02/17/17 0214 02/18/17 0227  INR 1.31 1.48 1.71 1.84 1.99   Cardiac Enzymes: No results for input(s): CKTOTAL, CKMB, CKMBINDEX, TROPONINI in the last 168 hours. BNP (last 3 results) No results for input(s): PROBNP in the last 8760 hours. HbA1C: No results for input(s): HGBA1C in the last 72 hours. CBG: No results for input(s): GLUCAP in the last 168 hours. Lipid Profile: No results for input(s): CHOL, HDL, LDLCALC, TRIG, CHOLHDL, LDLDIRECT in the last 72 hours. Thyroid Function Tests: No results for input(s): TSH, T4TOTAL, FREET4, T3FREE, THYROIDAB in the last 72 hours. Anemia Panel: No results for input(s): VITAMINB12, FOLATE, FERRITIN, TIBC, IRON, RETICCTPCT in the last 72 hours. Urine analysis:    Component Value Date/Time   COLORURINE YELLOW 02/11/2017 1348   APPEARANCEUR HAZY (A) 02/11/2017 1348   LABSPEC 1.030 02/11/2017 1348   PHURINE 6.0 02/11/2017 1348   GLUCOSEU NEGATIVE 02/11/2017 1348   HGBUR LARGE (A) 02/11/2017 1348   BILIRUBINUR NEGATIVE 02/11/2017 1348   KETONESUR NEGATIVE 02/11/2017 1348   PROTEINUR 30 (A) 02/11/2017 1348   UROBILINOGEN 1.0 04/28/2015 1930   NITRITE NEGATIVE 02/11/2017 1348   LEUKOCYTESUR SMALL (A) 02/11/2017 1348   Sepsis Labs: @LABRCNTIP (procalcitonin:4,lacticidven:4) ) Recent Results (from the past 240 hour(s))  Culture, Urine     Status: Abnormal   Collection Time: 02/11/17  1:52 PM  Result Value Ref Range Status   Specimen Description URINE, RANDOM  Final   Special Requests NONE  Final   Culture (A)  Final    >=100,000 COLONIES/mL LACTOBACILLUS SPECIES Standardized susceptibility testing for this organism is not available.    Report Status 02/12/2017 FINAL  Final         Radiology Studies: No results found.    Scheduled Meds: . hydroxychloroquine  200 mg Oral Daily    . ketorolac  30 mg Intravenous Q6H  . methylPREDNISolone  4 mg Oral Daily  . senna  1 tablet Oral BID  . warfarin  17.5 mg Oral ONCE-1800  . Warfarin - Pharmacist Dosing Inpatient   Does not apply q1800   Continuous Infusions: . heparin 1,600 Units/hr (02/18/17 1247)     LOS: 9 days    Time spent in minutes: 35    Calvert CantorSaima Eryca Bolte, MD [facility] Pager: [email or web address] Password [email or web address] 02/18/2017, 1:23 PM

## 2017-02-18 NOTE — Progress Notes (Signed)
ANTICOAGULATION CONSULT NOTE - Follow Up Consult  Pharmacy Consult:  Heparin/Coumadin (Overlap D#5/5) Indication: pulmonary embolus  Allergies  Allergen Reactions  . Bactrim Anaphylaxis and Hives  . Prednisone Anaphylaxis and Hives    Has been on Dexamethasone without issue.  . Nsaids Other (See Comments)    Has Lupus, reccommended to avoid NSAIDs  . Other Itching    Shrimp:throat itching "can eat other shellfish"  . Pineapple Itching  . Procardia [Nifedipine] Swelling    Swelling of tongue  . Vicodin [Hydrocodone-Acetaminophen] Hives and Swelling    Can take Percocet without difficulty    Patient Measurements: Height: 5\' 8"  (172.7 cm) Weight: (!) 330 lb 4.8 oz (149.8 kg) IBW/kg (Calculated) : 63.9  Heparin dosing weight  = 101 kg  Vital Signs: Temp: 97.8 F (36.6 C) (07/29 0500) Temp Source: Oral (07/29 0500) BP: 127/79 (07/29 0500) Pulse Rate: 70 (07/29 0500)  Labs:  Recent Labs  02/16/17 0253 02/17/17 0214 02/18/17 0227  HGB  --   --  11.9*  HCT  --   --  37.1  PLT  --   --  321  LABPROT 20.2* 21.5* 22.9*  INR 1.71 1.84 1.99  HEPARINUNFRC 0.51 0.52 0.45    Estimated Creatinine Clearance: 161 mL/min (by C-G formula based on SCr of 0.8 mg/dL).  Assessment: 31 YOF on Eliquis PTA for history of PE that was diagnosed on 12/30/2016. She was initially on Lovenox for several weeks and then switched to Eliquis after a hematology consult. Patient admitted with chest pain and "knot in foot."  CTA positive for a small PE (unsure if acute or residual from PE in June).  Doppler negative for DVT.  Patient was transitioned to Coumadin and IV heparin bridge.  Heparin level is therapeutic and INR remains sub-therapeutic at 1.99 today; Increasing slowly despite large doses and would like to give small increase x 1 to ensure comfortable therapeutic level for pt d/c.  Would plan on 15mg  daily upon d/c and f/u with anticoag clinic. H&H, pltc stable. No bleeding reported.  Heparin  level therapeutic: 0.45  Goal of Therapy:  Heparin level 0.3-0.7 units/ml INR goal: 2-3 Monitor platelets by anticoagulation protocol: Yes    Plan:  Continue heparin gtt at 1600 units/hr Warfarin 17.5mg  PO x 1 Daily heparin level, PT / INR and CBC, s/s bleeding Would plan on 15mg  daily at discharge with f/u at anticoag clinic   Daylene PoseyJonathan Ireanna Finlayson, PharmD Pharmacy Resident Pager #: [phone] 02/18/2017 9:02 AM

## 2017-02-19 ENCOUNTER — Telehealth: Payer: Self-pay | Admitting: Physician Assistant

## 2017-02-19 DIAGNOSIS — Z7901 Long term (current) use of anticoagulants: Secondary | ICD-10-CM

## 2017-02-19 DIAGNOSIS — Z86711 Personal history of pulmonary embolism: Secondary | ICD-10-CM

## 2017-02-19 DIAGNOSIS — D6862 Lupus anticoagulant syndrome: Secondary | ICD-10-CM

## 2017-02-19 LAB — CBC
HEMATOCRIT: 35.5 % — AB (ref 36.0–46.0)
HEMOGLOBIN: 11.6 g/dL — AB (ref 12.0–15.0)
MCH: 26.8 pg (ref 26.0–34.0)
MCHC: 32.7 g/dL (ref 30.0–36.0)
MCV: 82 fL (ref 78.0–100.0)
Platelets: 295 10*3/uL (ref 150–400)
RBC: 4.33 MIL/uL (ref 3.87–5.11)
RDW: 14.2 % (ref 11.5–15.5)
WBC: 6.8 10*3/uL (ref 4.0–10.5)

## 2017-02-19 LAB — PROTIME-INR
INR: 2.06
PROTHROMBIN TIME: 23.5 s — AB (ref 11.4–15.2)

## 2017-02-19 LAB — HEPARIN LEVEL (UNFRACTIONATED): HEPARIN UNFRACTIONATED: 0.5 [IU]/mL (ref 0.30–0.70)

## 2017-02-19 MED ORDER — WARFARIN SODIUM 2.5 MG PO TABS: ORAL_TABLET | ORAL | 0 refills | Status: DC

## 2017-02-19 MED ORDER — WARFARIN SODIUM 7.5 MG PO TABS
17.5000 mg | ORAL_TABLET | Freq: Once | ORAL | Status: AC
  Administered 2017-02-19: 17.5 mg via ORAL
  Filled 2017-02-19: qty 1

## 2017-02-19 MED ORDER — TRAMADOL HCL 50 MG PO TABS: 50.0000 mg | ORAL_TABLET | Freq: Four times a day (QID) | ORAL | 0 refills | Status: DC | PRN

## 2017-02-19 MED ORDER — MELOXICAM 7.5 MG PO TABS: 7.5000 mg | ORAL_TABLET | Freq: Two times a day (BID) | ORAL | Status: DC

## 2017-02-19 MED ORDER — MELOXICAM 7.5 MG PO TABS: 7.5000 mg | ORAL_TABLET | Freq: Two times a day (BID) | ORAL | 0 refills | Status: DC

## 2017-02-19 MED ORDER — TRAMADOL HCL 50 MG PO TABS: 100.0000 mg | ORAL_TABLET | Freq: Four times a day (QID) | ORAL | 0 refills | Status: DC | PRN

## 2017-02-19 MED ORDER — KETOROLAC TROMETHAMINE 30 MG/ML IJ SOLN
30.0000 mg | Freq: Four times a day (QID) | INTRAMUSCULAR | Status: DC | PRN
  Filled 2017-02-19: qty 1

## 2017-02-19 NOTE — Progress Notes (Signed)
Pt. Has been given discharge instructions along with new medication list and what to take today. Pt. Has follow up appointments and prescriptions to pick up. Pt. Has been instructed to call for follow up appointment for Dr. Isaiah SergeMoll. Office was closed, appointment was not able to be made in hospital. The patient is discharging with her mother via car. Hazen Brumett A Florencio Hollibaugh 02/19/2017 6:23 PM

## 2017-02-19 NOTE — Progress Notes (Signed)
ANTICOAGULATION CONSULT NOTE - Follow Up Consult  Pharmacy Consult for Heparin and Coumadin Indication: pulmonary embolus  Allergies  Allergen Reactions  . Bactrim Anaphylaxis and Hives  . Prednisone Anaphylaxis and Hives    Has been on Dexamethasone without issue.  . Nsaids Other (See Comments)    Has Lupus, reccommended to avoid NSAIDs  . Other Itching    Shrimp:throat itching "can eat other shellfish"  . Pineapple Itching  . Procardia [Nifedipine] Swelling    Swelling of tongue  . Vicodin [Hydrocodone-Acetaminophen] Hives and Swelling    Can take Percocet without difficulty    Patient Measurements: Height: 5\' 8"  (172.7 cm) Weight: (!) 332 lb 14.4 oz (151 kg) IBW/kg (Calculated) : 63.9 Heparin Dosing Weight: 101 kg  Vital Signs: Temp: 98 F (36.7 C) (07/30 0517) Temp Source: Oral (07/30 0517) BP: 127/89 (07/30 0517) Pulse Rate: 67 (07/30 0517)  Labs:  Recent Labs  02/17/17 0214 02/18/17 0227 02/18/17 1340 02/19/17 0323  HGB  --  11.9*  --  11.6*  HCT  --  37.1  --  35.5*  PLT  --  321  --  295  LABPROT 21.5* 22.9*  --  23.5*  INR 1.84 1.99  --  2.06  HEPARINUNFRC 0.52 0.45  --  0.50  CREATININE  --   --  0.85  --     Estimated Creatinine Clearance: 152.2 mL/min (by C-G formula based on SCr of 0.85 mg/dL).  Assessment:  31 YOF on Eliquis PTA for history of PE that was diagnosed on 12/30/2016. She was initially on Lovenox for several weeks and then switched to Eliquis after a hematology consult. Patient admitted with chest pain and "knot in foot."  CTA positive for a small PE (unsure if acute or residual from PE in June).  Doppler negative for DVT.  Repeat CT on 7/29 showed no evidence for PE.  Patient was transitioned to Coumadin and IV heparin bridge.  Heparin level is therapeutic (0.50) on 1600 units/hr and INR is now low therapeutic (2.06) after Coumadin dose increased from 15 mg to 17.5 mg x 1 on 7/29. First day in target range. Had anticipated more of an  increase in INR today.   Toradol 30 mg IV q6h added 7/29.  Patient reports dark stool, but known bleeding PR. Nosebleed this am and yesterday. She will report any additional significant nosebleed. Could try saline nasal spray prn.  Goal of Therapy:  INR 2-3 Heparin level 0.3-0.7 units/ml Monitor platelets by anticoagulation protocol: Yes   Plan:   Continue heparin drip at 1600 units/hr.  Repeat Coumadin 17.5 mg x 1 today.  Daily heparin level, PT/INR and CBC.  Would still plan on Coumadin 15 mg daily as starting outpatient dose, with INR check later this week.  Suggest Rx with 5 mg tablets for more flexibility with outpatient dosing.  May need 17.5 mg some days of the week.  Follow up for NSAID plan.    Monitor for any bleeding.  Dennie FettersEgan, Chidubem Chaires Donovan, RPh Pager: [phone] 02/19/2017,10:51 AM

## 2017-02-19 NOTE — Telephone Encounter (Signed)
Spoke with patient, appointment scheduled for 02/27/17 @ 9:20am. S.Mekaila Tarnow,CMA

## 2017-02-19 NOTE — Progress Notes (Signed)
HEMATOLOGY-ONCOLOGY PROGRESS NOTE  SUBJECTIVE: History of pulmonary embolism, complaining of left chest wall discomfort that radiates from front to back Patient present to the hospital with complaints of sudden onset of left-sided chest pain that started when her 29-year-old son had jumped up on her chest and punched on her left chest wall. Since then she has had intractable pain for the past 10 days. Jerald KiefWelsh is been in the hospital she had CT scans of the chest performed. The most recent CT was performed yesterday which showed no evidence of pulmonary embolism. She is currently therapeutic on the Coumadin dosage. She reports that the pain does not appear to be getting better infected toenail appears to be getting worse. The only time she had some relief in the pain was when she received Toradol. She was prescribed Percocets but they don't seem to be helping her pain. She was also given tramadol. She does not complain of palpitations.  OBJECTIVE: REVIEW OF SYSTEMS:   Constitutional: Denies fevers, chills or abnormal weight loss Eyes: Denies blurriness of vision Ears, nose, mouth, throat, and face: Denies mucositis or sore throat Respiratory:Left chest wall pain  Cardiovascular: Denies palpitation, chest discomfort Gastrointestinal:  Denies nausea, heartburn or change in bowel habits Skin: Denies abnormal skin rashes Lymphatics: Denies new lymphadenopathy or easy bruising Neurological:Denies numbness, tingling or new weaknesses Behavioral/Psych: Mood is stable, no new changes  Extremities: No lower extremity edema  All other systems were reviewed with the patient and are negative.  I have reviewed the past medical history, past surgical history, social history and family history with the patient and they are unchanged from previous note.   PHYSICAL EXAMINATION: ECOG PERFORMANCE STATUS: 2 - Symptomatic, <50% confined to bed  Vitals:   02/19/17 0517 02/19/17 1514  BP: 127/89 129/75  Pulse:  67   Resp: 18   Temp: 98 F (36.7 C) 97.8 F (36.6 C)   Filed Weights   02/17/17 0500 02/18/17 0500 02/19/17 0517  Weight: (!) 330 lb 14.4 oz (150.1 kg) (!) 330 lb 4.8 oz (149.8 kg) (!) 332 lb 14.4 oz (151 kg)    GENERAL:alert, no distress and comfortable SKIN: skin color, texture, turgor are normal, no rashes or significant lesions EYES: normal, Conjunctiva are pink and non-injected, sclera clear OROPHARYNX:no exudate, no erythema and lips, buccal mucosa, and tongue normal  NECK: supple, thyroid normal size, non-tender, without nodularity LYMPH:  no palpable lymphadenopathy in the cervical, axillary or inguinal LUNGS: clear to auscultation and percussion with normal breathing effort HEART: regular rate & rhythm and no murmurs and no lower extremity edema ABDOMEN:abdomen soft, non-tender and normal bowel sounds Musculoskeletal:no cyanosis of digits and no clubbing  NEURO: alert & oriented x 3 with fluent speech, no focal motor/sensory deficits  LABORATORY DATA:  I have reviewed the data as listed CMP Latest Ref Rng & Units 02/18/2017 02/09/2017 02/01/2017  Glucose 65 - 99 mg/dL 409(W106(H) 119(J115(H) 93  BUN 6 - 20 mg/dL 11 15 12   Creatinine 0.44 - 1.00 mg/dL 4.780.85 2.950.80 6.210.78  Sodium 135 - 145 mmol/L 135 135 142  Potassium 3.5 - 5.1 mmol/L 4.2 3.8 4.2  Chloride 101 - 111 mmol/L 98(L) 101 102  CO2 22 - 32 mmol/L 28 26 21   Calcium 8.9 - 10.3 mg/dL 9.4 9.3 9.7  Total Protein 6.0 - 8.5 g/dL - - 7.2  Total Bilirubin 0.0 - 1.2 mg/dL - - <3.0<0.2  Alkaline Phos 39 - 117 IU/L - - 108  AST 0 - 40 IU/L - -  17  ALT 0 - 32 IU/L - - 19    Lab Results  Component Value Date   WBC 6.8 02/19/2017   HGB 11.6 (L) 02/19/2017   HCT 35.5 (L) 02/19/2017   MCV 82.0 02/19/2017   PLT 295 02/19/2017   NEUTROABS 1.9 02/01/2017    ASSESSMENT AND PLAN: 1. Chest wall pain: No evidence of any blood clots. I suspect that this may be costochondritis since the pain is being to produce by pressure as well as some deep  inspiration. Patient had some benefit from Toradol injection. Because of this I will start the patient on meloxicam 7.5 mg twice a day. I understand that because of Coumadin there is concern from NSAIDs. However it appears that NSAIDs have been the most effective pain reliever so far for her.  If it helps her pain then she could be discharged home on meloxicam.  2. pulmonary embolism: Continue with the current treatment with Coumadin. 3. Lupus anticoagulant: Lifelong anticoagulation is necessary. If she gets discharged, instructed her not to lift heavy objects or her 29-year-old son for about a month.    I discussed with her that I do not anticipate that the pain will go away anytime soon and that she needs to have patients to try these medications and give time for her symptoms to get better.   I support completely the attempts pain made by Dr. Butler Denmarkizwan to get her pain under control. There are no additional interventions or diagnostic tests that can help elucidate the cause of her pain.

## 2017-02-19 NOTE — Telephone Encounter (Signed)
This was returned to caitlin signed.

## 2017-02-19 NOTE — Discharge Summary (Signed)
Physician Discharge Summary  Mayo Owczarzak ZOX:[id number] DOB: 10-26-1985 DOA: 02/09/2017  PCP: Trena Platt D, PA  Admit date: 02/09/2017 Discharge date: 02/19/2017  Admitted From: home Disposition:  home   Recommendations for Outpatient Follow-up:  1. INR check in 2 days - adjust Coumadin as needed    Discharge Condition:  stable  CODE STATUS:  Full code   Consultations:  oncology    Discharge Diagnoses:  Principal Problem:   Pulmonary embolus (HCC) Active Problems:   Asthma   Lupus   Antiphospholipid antibody syndrome (HCC)    Subjective: Continues to feel that chest pain is unresolved. Toradol helped a little.  Brief Summary: Hayley Webb  is a 31 y.o.femalewith a past medical history significant for SLE with APLA on Eliquis, morbid obesity, and mild asthmawho presents with chest pain who was found to have new PE while on eliquis. She has failed treatment with xarelto and eliquis and has been started on IV heparin and coumadin. Oncology consulted on admission and recommended referral to tertiary care for anti coagulation specialist on discharge.    Hospital Course:  Principal Problem:   Pulmonary embolus - continued on Heparin and Coumadin (from 7/21)  - Coumadin dosing >> 10x 2 days, 12.5, 15 x 6 days, 17.5 yesterday - INR therapeutic today at 2.06- recommend to continue at a dose of 15 mg daily and repeat INR in 2 days  - Oncology recommended outpt eval for underlying coagulation disorder - she developed new chest pain yesterday which was worse on ambulating and non-reproducible - a repeat CT scan was negative for a new PE  Active Problems: Chest pain- left sided musculoskeletal - part of pain is reproducible- Voltaren gel ordered  On 7/25- but later d/c'd on 7/28 by the RN - Morphine made her sleepy and Oxycodone did not help - Pain then travelled to her left lateral chest and left back- this was not reproducible -added Toradol QID which has  helped - appreciate Dr Pamelia Hoit following up with patient today- he has added Mobic which I agree with.     Asthma - stable   Lupus/   Antiphospholipid antibody syndrome  - cont Plaquenil and steroids  Abnormal UA - UA was bloody ( may be due to being on Heparin) and had a small amount of leukocytes and many bacteria  - given Keflex for 4 days - > 100,000 lactobacillus isolated but patient is asymptomatic    Discharge Instructions  Discharge Instructions    Diet - low sodium heart healthy    Complete by:  As directed    Increase activity slowly    Complete by:  As directed      Allergies as of 02/19/2017      Reactions   Bactrim Anaphylaxis, Hives   Prednisone Anaphylaxis, Hives   Has been on Dexamethasone without issue.   Nsaids Other (See Comments)   Has Lupus, reccommended to avoid NSAIDs   Other Itching   Shrimp:throat itching "can eat other shellfish"   Pineapple Itching   Procardia [nifedipine] Swelling   Swelling of tongue   Vicodin [hydrocodone-acetaminophen] Hives, Swelling   Can take Percocet without difficulty      Medication List    STOP taking these medications   ELIQUIS 5 MG Tabs tablet Generic drug:  apixaban     TAKE these medications   acetaminophen 500 MG tablet Commonly known as:  TYLENOL Take 1,000 mg by mouth every 6 (six) hours as needed for headache (pain).   albuterol  108 (90 Base) MCG/ACT inhaler Commonly known as:  PROVENTIL HFA;VENTOLIN HFA Inhale 2 puffs into the lungs every 6 (six) hours as needed for wheezing or shortness of breath.   albuterol (2.5 MG/3ML) 0.083% nebulizer solution Commonly known as:  PROVENTIL Take 2.5 mg by nebulization every 6 (six) hours as needed for wheezing or shortness of breath.   hydroxychloroquine 200 MG tablet Commonly known as:  PLAQUENIL Take 200 mg by mouth daily.   meloxicam 7.5 MG tablet Commonly known as:  MOBIC Take 1 tablet (7.5 mg total) by mouth 2 (two) times daily.    methylPREDNISolone 4 MG tablet Commonly known as:  MEDROL Take 4 mg by mouth daily.   multivitamin with minerals Tabs tablet Take 1 tablet by mouth daily.   oxyCODONE-acetaminophen 5-325 MG tablet Commonly known as:  PERCOCET/ROXICET Take 1 tablet by mouth every 6 (six) hours as needed for moderate pain.   senna 8.6 MG Tabs tablet Commonly known as:  SENOKOT Take 1 tablet (8.6 mg total) by mouth 2 (two) times daily.   traMADol 50 MG tablet Commonly known as:  ULTRAM Take 1-2 tablets (50-100 mg total) by mouth every 6 (six) hours as needed for severe pain.   warfarin 2.5 MG tablet Commonly known as:  COUMADIN 15 mg daily to be adjusted when you have your next INR in 2 days.       Allergies  Allergen Reactions  . Bactrim Anaphylaxis and Hives  . Prednisone Anaphylaxis and Hives    Has been on Dexamethasone without issue.  . Nsaids Other (See Comments)    Has Lupus, reccommended to avoid NSAIDs  . Other Itching    Shrimp:throat itching "can eat other shellfish"  . Pineapple Itching  . Procardia [Nifedipine] Swelling    Swelling of tongue  . Vicodin [Hydrocodone-Acetaminophen] Hives and Swelling    Can take Percocet without difficulty     Procedures/Studies:    Ct Angio Chest Pe W Or Wo Contrast  Result Date: 02/18/2017 CLINICAL DATA:  History of pulmonary embolism. EXAM: CT ANGIOGRAPHY CHEST WITH CONTRAST TECHNIQUE: Multidetector CT imaging of the chest was performed using the standard protocol during bolus administration of intravenous contrast. Multiplanar CT image reconstructions and MIPs were obtained to evaluate the vascular anatomy. CONTRAST:  90 mL of Isovue 370 intravenously. COMPARISON:  CT scan of February 09, 2017. FINDINGS: Cardiovascular: Satisfactory opacification of the pulmonary arteries to the segmental level. No evidence of pulmonary embolism. Normal heart size. No pericardial effusion. Mediastinum/Nodes: No enlarged mediastinal, hilar, or axillary lymph  nodes. Thyroid gland, trachea, and esophagus demonstrate no significant findings. Lungs/Pleura: Lungs are clear. No pleural effusion or pneumothorax. Upper Abdomen: No acute abnormality. Musculoskeletal: No chest wall abnormality. No acute or significant osseous findings. Review of the MIP images confirms the above findings. IMPRESSION: No definite evidence of pulmonary embolus is seen currently. No other abnormality seen in the chest. Electronically Signed   By: Lupita RaiderJames  Green Jr, M.D.   On: 02/18/2017 19:36   Ct Angio Chest Pe W And/or Wo Contrast  Result Date: 02/09/2017 CLINICAL DATA:  Chest pain starting today. Patient being treated for pulmonary embolus diagnosed in June. EXAM: CT ANGIOGRAPHY CHEST WITH CONTRAST TECHNIQUE: Multidetector CT imaging of the chest was performed using the standard protocol during bolus administration of intravenous contrast. Multiplanar CT image reconstructions and MIPs were obtained to evaluate the vascular anatomy. CONTRAST:  100 cc Isovue 370 IV COMPARISON:  02/01/2017 FINDINGS: Cardiovascular: The study is slightly limited for the evaluation  of central pulmonary embolism due to patient body habitus and timing of contrast bolus. Tiny strand-like nonocclusive filling defects to the left lower lobe consistent with small pulmonary emboli are noted. No heart strain. Great vessels are normal in course and caliber. Normal heart size. No significant pericardial fluid/thickening. Mediastinum/Nodes: No discrete thyroid nodules. Unremarkable esophagus. No pathologically enlarged axillary, mediastinal or hilar lymph nodes. Lungs/Pleura: No pneumothorax. No pleural effusion. No acute pulmonary consolidation or dominant mass. Upper abdomen: Unremarkable. Musculoskeletal:  No aggressive appearing focal osseous lesions. Review of the MIP images confirms the above findings. IMPRESSION: Tiny strand-like filling defects within left lower lobe pulmonary pulmonary arteries consistent with  nonocclusive pulmonary emboli. No heart strain. Critical Value/emergent results were called by telephone at the time of interpretation on 02/09/2017 at 9:55 pm to [facility] , who verbally acknowledged these results. Electronically Signed   By: Tollie Eth M.D.   On: 02/09/2017 21:55   Ct Angio Chest Pe W Or Wo Contrast  Result Date: 02/01/2017 CLINICAL DATA:  Patient on blood thinner for prior pulmonary embolus. Chest pain shortness of breath. EXAM: CT ANGIOGRAPHY CHEST WITH CONTRAST TECHNIQUE: Multidetector CT imaging of the chest was performed using the standard protocol during bolus administration of intravenous contrast. Multiplanar CT image reconstructions and MIPs were obtained to evaluate the vascular anatomy. CONTRAST:  100 cc Isovue 370 COMPARISON:  Chest CT 12/29/2016; 01/05/2017. FINDINGS: Cardiovascular: Normal heart size. No pericardial effusion. Aorta and main pulmonary artery are normal in caliber. Adequate opacification of the pulmonary arterial system. No filling defects are identified within the pulmonary arteries to suggest residual pulmonary embolus. Mediastinum/Nodes: No enlarged axillary, mediastinal or hilar lymphadenopathy. Prominent mediastinal lymph nodes are unchanged. Normal esophagus. Lungs/Pleura: Central airways are patent. Dependent atelectasis within the bilateral lower lobes. No pleural effusion. No pneumothorax. No large area pulmonary consolidation. Upper Abdomen: No acute process. Musculoskeletal: No aggressive or acute appearing osseous lesions. Review of the MIP images confirms the above findings. IMPRESSION: Previously described pulmonary emboli are not visualized on current examination. No new pulmonary emboli identified. Bibasilar atelectasis. Electronically Signed   By: Annia Belt M.D.   On: 02/01/2017 13:10       Discharge Exam: Vitals:   02/19/17 0517 02/19/17 1514  BP: 127/89 129/75  Pulse: 67   Resp: 18   Temp: 98 F (36.7 C) 97.8 F (36.6 C)    Vitals:   02/18/17 1407 02/18/17 2050 02/19/17 0517 02/19/17 1514  BP: 131/73 123/85 127/89 129/75  Pulse: 80 66 67   Resp: 17 17 18    Temp: 98 F (36.7 C) 97.7 F (36.5 C) 98 F (36.7 C) 97.8 F (36.6 C)  TempSrc: Oral Oral Oral Oral  SpO2: 100% 100% 100% 97%  Weight:   (!) 151 kg (332 lb 14.4 oz)   Height:        General exam: Appears comfortable  HEENT: PERRLA, oral mucosa moist, no sclera icterus or thrush Respiratory system: Clear to auscultation. Respiratory effort normal. Cardiovascular system: S1 & S2 heard,  No murmurs  Gastrointestinal system: Abdomen soft, non-tender, nondistended. Normal bowel sound. No organomegaly Central nervous system: Alert and oriented. No focal neurological deficits. Extremities: No cyanosis, clubbing or edema Skin: No rashes or ulcers Psychiatry:  Mood & affect appropriate.      The results of significant diagnostics from this hospitalization (including imaging, microbiology, ancillary and laboratory) are listed below for reference.     Microbiology: Recent Results (from the past 240 hour(s))  Culture, Urine  Status: Abnormal   Collection Time: 02/11/17  1:52 PM  Result Value Ref Range Status   Specimen Description URINE, RANDOM  Final   Special Requests NONE  Final   Culture (A)  Final    >=100,000 COLONIES/mL LACTOBACILLUS SPECIES Standardized susceptibility testing for this organism is not available.    Report Status 02/12/2017 FINAL  Final     Labs: BNP (last 3 results)  Recent Labs  12/26/16 0749 01/12/17 1644  BNP 13.2 11.1   Basic Metabolic Panel:  Recent Labs Lab 02/18/17 1340  NA 135  K 4.2  CL 98*  CO2 28  GLUCOSE 106*  BUN 11  CREATININE 0.85  CALCIUM 9.4   Liver Function Tests: No results for input(s): AST, ALT, ALKPHOS, BILITOT, PROT, ALBUMIN in the last 168 hours. No results for input(s): LIPASE, AMYLASE in the last 168 hours. No results for input(s): AMMONIA in the last 168  hours. CBC:  Recent Labs Lab 02/13/17 0234 02/14/17 0228 02/15/17 0221 02/18/17 0227 02/19/17 0323  WBC 7.4 7.3 7.9 8.1 6.8  HGB 12.1 11.7* 11.5* 11.9* 11.6*  HCT 37.5 36.6 35.5* 37.1 35.5*  MCV 83.7 82.4 82.6 82.4 82.0  PLT 341 356 337 321 295   Cardiac Enzymes: No results for input(s): CKTOTAL, CKMB, CKMBINDEX, TROPONINI in the last 168 hours. BNP: Invalid input(s): POCBNP CBG: No results for input(s): GLUCAP in the last 168 hours. D-Dimer No results for input(s): DDIMER in the last 72 hours. Hgb A1c No results for input(s): HGBA1C in the last 72 hours. Lipid Profile No results for input(s): CHOL, HDL, LDLCALC, TRIG, CHOLHDL, LDLDIRECT in the last 72 hours. Thyroid function studies No results for input(s): TSH, T4TOTAL, T3FREE, THYROIDAB in the last 72 hours.  Invalid input(s): FREET3 Anemia work up No results for input(s): VITAMINB12, FOLATE, FERRITIN, TIBC, IRON, RETICCTPCT in the last 72 hours. Urinalysis    Component Value Date/Time   COLORURINE YELLOW 02/11/2017 1348   APPEARANCEUR HAZY (A) 02/11/2017 1348   LABSPEC 1.030 02/11/2017 1348   PHURINE 6.0 02/11/2017 1348   GLUCOSEU NEGATIVE 02/11/2017 1348   HGBUR LARGE (A) 02/11/2017 1348   BILIRUBINUR NEGATIVE 02/11/2017 1348   KETONESUR NEGATIVE 02/11/2017 1348   PROTEINUR 30 (A) 02/11/2017 1348   UROBILINOGEN 1.0 04/28/2015 1930   NITRITE NEGATIVE 02/11/2017 1348   LEUKOCYTESUR SMALL (A) 02/11/2017 1348   Sepsis Labs Invalid input(s): PROCALCITONIN,  WBC,  LACTICIDVEN Microbiology Recent Results (from the past 240 hour(s))  Culture, Urine     Status: Abnormal   Collection Time: 02/11/17  1:52 PM  Result Value Ref Range Status   Specimen Description URINE, RANDOM  Final   Special Requests NONE  Final   Culture (A)  Final    >=100,000 COLONIES/mL LACTOBACILLUS SPECIES Standardized susceptibility testing for this organism is not available.    Report Status 02/12/2017 FINAL  Final     Time  coordinating discharge: Over 30 minutes  SIGNED:   Calvert CantorSaima Tait Balistreri, MD  [facility] 02/19/2017, 5:31 PM Pager   If 7PM-7AM, please contact night-coverage [email or web address] Password [email or web address]

## 2017-02-19 NOTE — Telephone Encounter (Signed)
Patient was just discharged after 10 days admitted for acute septic pulmonary embolism.   Please contact her for hospitalization follow up within 7-10 days.  It's ok to see her next week.  I will postpone further request of pulmonary rehab until I revisit with her.

## 2017-02-19 NOTE — Telephone Encounter (Signed)
[employer] needs a Provider statement completed by Trena PlattStephanie English for this patient, I completed what I could from the OV notes and highlighted the areas I was unsure about. I was not sure if she referred her to a different doctor or if we just sent her for CT scans. I will place the forms in your box on 02/19/17 please return them to the FMLA/Disability box at the 102 checkout desk within 5-7 business days.  Thank you!

## 2017-02-22 ENCOUNTER — Encounter: Payer: Self-pay | Admitting: Urgent Care

## 2017-02-22 ENCOUNTER — Ambulatory Visit (INDEPENDENT_AMBULATORY_CARE_PROVIDER_SITE_OTHER): Payer: 59 | Admitting: Urgent Care

## 2017-02-22 VITALS — BP 148/75 | HR 98 | Temp 98.0°F | Resp 18 | Ht 68.0 in | Wt 332.8 lb

## 2017-02-22 DIAGNOSIS — R079 Chest pain, unspecified: Secondary | ICD-10-CM | POA: Diagnosis not present

## 2017-02-22 DIAGNOSIS — R0602 Shortness of breath: Secondary | ICD-10-CM | POA: Diagnosis not present

## 2017-02-22 DIAGNOSIS — I2699 Other pulmonary embolism without acute cor pulmonale: Secondary | ICD-10-CM | POA: Diagnosis not present

## 2017-02-22 DIAGNOSIS — Z7901 Long term (current) use of anticoagulants: Secondary | ICD-10-CM | POA: Diagnosis not present

## 2017-02-22 NOTE — Progress Notes (Signed)
    MRN: [id number] DOB: 1986-06-18  Subjective:   Hayley Webb is a 31 y.o. female presenting for follow up on PE, INR. 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 She was discharged 02/19/2017, on coumadin. She is presenting today to recheck her INR. [facility] work up and management. She has had persistent chest pain associated with her PE. Dr. Georgette ShellGuneda has recommended mobic for this given adverse effects of drowsiness to morphine and failed oxycodone. She reports while in the hospital she was titrated from 10mg  warfarin up to her current dose of 17.5mg  daily. Her last INR was finally within therapeutic range at 2.06. Patient has follow up set up on 02/27/2017 at our clinic. Of note, patient reports that her chest pain and shob persists and remains unchanged from her hospitalization. She is using meloxicam as needed with some help but is being very judicious with her use as well to avoid adverse effects.  Hayley Webb has a current medication list which includes the following prescription(s): acetaminophen, albuterol, albuterol, hydroxychloroquine, meloxicam, methylprednisolone, multivitamin with minerals, oxycodone-acetaminophen, senna, tramadol, and warfarin. Also is allergic to bactrim; prednisone; nsaids; other; pineapple; procardia [nifedipine]; and vicodin [hydrocodone-acetaminophen].  Hayley Webb  has a past medical history of Anxiety; Asthma; Headache(784.0); HSV infection; Infection; Lupus; Pulmonary embolism (HCC) (06/4/82011); and STD (sexually transmitted disease) (02/2009). Also  has a past surgical history that includes Tonsillectomy and adenoidectomy (1998); Mouth surgery; and Wisdom tooth extraction.  Objective:   Vitals: BP (!) 148/75   Pulse 98   Temp 98 F (36.7 C) (Oral)   Resp 18   Ht 5\' 8"  (1.727 m)   Wt (!) 332 lb 12.8 oz (151 kg)   LMP 01/16/2017 (Approximate)    SpO2 96%   BMI 50.60 kg/m   BP Readings from Last 3 Encounters:  02/22/17 (!) 148/75  02/19/17 129/75  02/01/17 (!) 140/96   Physical Exam  Constitutional: She is oriented to person, place, and time. She appears well-developed and well-nourished.  HENT:  Mouth/Throat: Oropharynx is clear and moist.  Eyes: No scleral icterus.  Cardiovascular: Normal rate, regular rhythm and intact distal pulses.  Exam reveals no gallop and no friction rub.   No murmur heard. Pulmonary/Chest: No respiratory distress. She has no wheezes. She has no rales.  Neurological: She is alert and oriented to person, place, and time.  Psychiatric: She has a normal mood and affect.   Assessment and Plan :   1. Other pulmonary embolism without acute cor pulmonale, unspecified chronicity (HCC) 2. Warfarin anticoagulation - Stable, INR pending, will make dose adjustments as necessary. - [facility]. Patient will get INR with us again on 02/27/2017 unless she can be seen back by Dr. Pamelia HoitGudena for management of her coumadin. - Return-to-clinic precautions discussed, patient verbalized understanding.   3. Chest pain, unspecified type 4. Shortness of breath - Take meloxicam as prescribed by Dr. Pamelia HoitGudena.  Wallis BambergMario Charnice Zwilling, PA-C [facility] [phone] 02/22/2017 5:08 PM

## 2017-02-22 NOTE — Patient Instructions (Addendum)
Pulmonary Embolism A pulmonary embolism (PE) is a sudden blockage or decrease of blood flow in one lung or both lungs. Most blockages come from a blood clot that travels from the legs or the pelvis to the lungs. PE is a dangerous and potentially life-threatening condition if it is not treated right away. What are the causes? A pulmonary embolism occurs most commonly when a blood clot travels from one of your veins to your lungs. Rarely, PE is caused by air, fat, amniotic fluid, or part of a tumor traveling through your veins to your lungs. What increases the risk? A PE is more likely to develop in:  People who smoke.  People who areolder, especially over 17 years of age.  People who are overweight (obese).  People who sit or lie still for a long time, such as during long-distance travel (over 4 hours), bed rest, hospitalization, or during recovery from certain medical conditions like a stroke.  People who do not engage in much physical activity (sedentary lifestyle).  People who have chronic breathing disorders.  People whohave a personal or family history of blood clots or blood clotting disease.  People whohave peripheral vascular disease (PVD), diabetes, or some types of cancer.  People who haveheart disease, especially if the person had a recent heart attack or has congestive heart failure.  People who have neurological diseases that affect the legs (leg paresis).  People who have had a traumatic injury, such as breaking a hip or leg.  People whohave recently had major or lengthy surgery, especially on the hip, knee, or abdomen.  People who have hada central line placed inside a large vein.  People who takemedicines that contain the hormone estrogen. These include birth control pills and hormone replacement therapy.  Pregnancy or during childbirth or the postpartum period.  What are the signs or symptoms? The symptoms of a PE usually start suddenly and  include:  Shortness of breath while active or at rest.  Coughing or coughing up blood or blood-tinged mucus.  Chest pain that is often worse with deep breaths.  Rapid or irregular heartbeat.  Feeling light-headed or dizzy.  Fainting.  Feelinganxious.  Sweating.  There may also be pain and swelling in a leg if that is where the blood clot started. These symptoms may represent a serious problem that is an emergency. Do not wait to see if the symptoms will go away. Get medical help right away. Call your local emergency services (911 in the [place] Do not drive yourself to the hospital. How is this diagnosed? Your health care provider will take a medical history and perform a physical exam. You may also have other tests, including:  Blood tests to assess the clotting properties of your blood, assess oxygen levels in your blood, and find blood clots.  Imaging tests, such as CT, ultrasound, MRI, X-ray, and other tests to see if you have clots anywhere in your body.  An electrocardiogram (ECG) to look for heart strain from blood clots in the lungs.  How is this treated? The main goals of PE treatment are:  To stop a blood clot from growing larger.  To stop new blood clots from forming.  The type of treatment that you receive depends on many factors, such as the cause of your PE, your risk for bleeding or developing more clots, and other medical conditions that you have. Sometimes, a combination of treatments is necessary. This condition may be treated with:  Medicines, including newer oral blood thinners (  anticoagulants), warfarin, low molecular weight heparins, thrombolytics, or heparins.  Wearing compression stockings or using different types of devices.  Surgery (rare) to remove the blood clot or to place a filter in your abdomen to stop the blood clot from traveling to your lungs.  Treatments for a PE are often divided into immediate treatment, long-term treatment (up to 3  months after PE), and extended treatment (more than 3 months after PE). Your treatment may continue for several months. This is called maintenance therapy, and it is used to prevent the forming of new blood clots. You can work with your health care provider to choose the treatment program that is best for you. What are anticoagulants? Anticoagulants are medicines that treat PEs. They can stop current blood clots from growing and stop new clots from forming. They cannot dissolve existing clots. Your body dissolves clots by itself over time. Anticoagulants are given by mouth, by injection, or through an IV tube. What are thrombolytics? Thrombolytics are clot-dissolving medicines that are used to dissolve a PE. They carry a high risk of bleeding, so they tend to be used only in severe cases or if you have very low blood pressure. Follow these instructions at home: If you are taking a newer oral anticoagulant:  Take the medicine every single day at the same time each day.  Understand what foods and drugs interact with this medicine.  Understand that there are no regular blood tests required when using this medicine.  Understandthe side effects of this medicine, including excessive bruising or bleeding. Ask your health care provider or pharmacist about other possible side effects. If you are taking warfarin:  Understand how to take warfarin and know which foods can affect how warfarin works in Veterinary surgeon.  Understand that it is dangerous to taketoo much or too little warfarin. Too much warfarin increases the risk of bleeding. Too little warfarin continues to allow the risk for blood clots.  Follow your PT and INR blood testing schedule. The PT and INR results allow your health care provider to adjust your dose of warfarin. It is very important that you have your PT and INR tested as often as told by your health care provider.  Avoid major changes in your diet, or tell your health care provider  before you change your diet. Arrange a visit with a registered dietitian to answer your questions. Many foods, especially foods that are high in vitamin K, can interfere with warfarin and affect the PT and INR results. Eat a consistent amount of foods that are high in vitamin K, such as: ? Spinach, kale, broccoli, cabbage, collard greens, turnip greens, Brussels sprouts, peas, cauliflower, seaweed, and parsley. ? Beef liver and pork liver. ? Green tea. ? Soybean oil.  Tell your health care provider about any and all medicines, vitamins, and supplements that you take, including aspirin and other over-the-counter anti-inflammatory medicines. Be especially cautious with aspirin and anti-inflammatory medicines. Do not take those before you ask your health care provider if it is safe to do so. This is important because many medicines can interfere with warfarin and affect the PT and INR results.  Do not start or stop taking any over-the-counter or prescription medicine unless your health care provider or pharmacist tells you to do so. If you take warfarin, you will also need to do these things:  Hold pressure over cuts for longer than usual.  Tell your dentist and other health care providers that you are taking warfarin before you have  any procedures in which bleeding may occur.  Avoid alcohol or drink very small amounts. Tell your health care provider if you change your alcohol intake.  Do not use tobacco products, including cigarettes, chewing tobacco, and e-cigarettes. If you need help quitting, ask your health care provider.  Avoid contact sports.  General instructions  Take over-the-counter and prescription medicines only as told by your health care provider. Anticoagulant medicines can have side effects, including easy bruising and difficulty stopping bleeding. If you are prescribed an anticoagulant, you will also need to do these things: ? Hold pressure over cuts for longer than  usual. ? Tell your dentist and other health care providers that you are taking anticoagulants before you have any procedures in which bleeding may occur. ? Avoid contact sports.  Wear a medical alert bracelet or carry a medical alert card that says you have had a PE.  Ask your health care provider how soon you can go back to your normal activities. Stay active to prevent new blood clots from forming.  Make sure to exercise while traveling or when you have been sitting or standing for a long period of time. It is very important to exercise. Exercise your legs by walking or by tightening and relaxing your leg muscles often. Take frequent walks.  Wear compression stockings as told by your health care provider to help prevent more blood clots from forming.  Do not use tobacco products, including cigarettes, chewing tobacco, and e-cigarettes. If you need help quitting, ask your health care provider.  Keep all follow-up appointments with your health care provider. This is important. How is this prevented? Take these actions to decrease your risk of developing another PE:  Exercise regularly. For at least 30 minutes every day, engage in: ? Activity that involves moving your arms and legs. ? Activity that encourages good blood flow through your body by increasing your heart rate.  Exercise your arms and legs every hour during long-distance travel (over 4 hours). Drink plenty of water and avoid drinking alcohol while traveling.  Avoid sitting or lying in bed for long periods of time without moving your legs.  Maintain a weight that is appropriate for your height. Ask your health care provider what weight is healthy for you.  If you are a woman who is over 21 years of age, avoid unnecessary use of medicines that contain estrogen. These include birth control pills.  Do not smoke, especially if you take estrogen medicines. If you need help quitting, ask your health care provider.  If you are at  very high risk for PE, wear compression stockings.  If you recently had a PE, have regularly scheduled ultrasound testing on your legs to check for new blood clots.  If you are hospitalized, prevention measures may include:  Early walking after surgery, as soon as your health care provider says that it is safe.  Receiving anticoagulants to prevent blood clots. If you cannot take anticoagulants, other options may be available, such as wearing compression stockings or using different types of devices.  Get help right away if:  You have new or increased pain, swelling, or redness in an arm or leg.  You have numbness or tingling in an arm or leg.  You have shortness of breath while active or at rest.  You have chest pain.  You have a rapid or irregular heartbeat.  You feel light-headed or dizzy.  You cough up blood.  You notice blood in your vomit, bowel movement, or  urine.  You have a fever. These symptoms may represent a serious problem that is an emergency. Do not wait to see if the symptoms will go away. Get medical help right away. Call your local emergency services (911 in the [place] Do not drive yourself to the hospital. This information is not intended to replace advice given to you by your health care provider. Make sure you discuss any questions you have with your health care provider. Document Released: 07/07/2000 Document Revised: 12/16/2015 Document Reviewed: 11/04/2014 Elsevier Interactive Patient Education  2017 [employer] Inc[employer]     Vitamin K Foods and Warfarin Warfarin is a blood thinner (anticoagulant). Anticoagulant medicines help prevent the formation of blood clots. These medicines work by decreasing the activity of vitamin K, which promotes normal blood clotting. When you take warfarin, problems can occur from suddenly increasing or decreasing the amount of vitamin K that you eat from one day to the next. Problems may include:  Blood clots.  Bleeding.  What  general guidelines do I need to follow? To avoid problems when taking warfarin:  Eat a balanced diet that includes: ? Fresh fruits and vegetables. ? Whole grains. ? Low-fat dairy products. ? Lean proteins, such as fish, eggs, and lean cuts of meat.  Keep your intake of vitamin K consistent from day to day. To do this: ? Avoid eating large amounts of vitamin K one day and low amounts of vitamin K the next day. ? If you take a multivitamin that contains vitamin K, be sure to take it every day. ? Know which foods contain vitamin K. Use the lists below to understand serving sizes and the amount of vitamin K in one serving.  Avoid major changes in your diet. If you are going to change your diet, talk with your health care provider before making changes.  Work with a Dealernutrition specialist (dietitian) to develop a meal plan that works best for you.  High vitamin K foods Foods that are high in vitamin K contain more than 100 mcg (micrograms) per serving. These include:  Broccoli (cooked) -  cup has 110 mcg.  Brussels sprouts (cooked) -  cup has 109 mcg.  Greens, beet (cooked) -  cup has 350 mcg.  Greens, collard (cooked) -  cup has 418 mcg.  Greens, turnip (cooked) -  cup has 265 mcg.  Green onions or scallions -  cup has 105 mcg.  Kale (fresh or frozen) -  cup has 531 mcg.  Parsley (raw) - 10 sprigs has 164 mcg.  Spinach (cooked) -  cup has 444 mcg.  Swiss chard (cooked) -  cup has 287 mcg.  Moderate vitamin K foods Foods that have a moderate amount of vitamin K contain 25-100 mcg per serving. These include:  Asparagus (cooked) - 5 spears have 38 mcg.  Black-eyed peas (dried) -  cup has 32 mcg.  Cabbage (cooked) -  cup has 37 mcg.  Kiwi fruit - 1 medium has 31 mcg.  Lettuce - 1 cup has 57-63 mcg.  Okra (frozen) -  cup has 44 mcg.  Prunes (dried) - 5 prunes have 25 mcg.  Watercress (raw) - 1 cup has 85 mcg.  Low vitamin K foods Foods low in vitamin K  contain less than 25 mcg per serving. These include:  Artichoke - 1 medium has 18 mcg.  Avocado - 1 oz. has 6 mcg.  Blueberries -  cup has 14 mcg.  Cabbage (raw) -  cup has 21 mcg.  Carrots (  cooked) -  cup has 11 mcg.  Cauliflower (raw) -  cup has 11 mcg.  Cucumber with peel (raw) -  cup has 9 mcg.  Grapes -  cup has 12 mcg.  Mango - 1 medium has 9 mcg.  Nuts - 1 oz. has 15 mcg.  Pear - 1 medium has 8 mcg.  Peas (cooked) -  cup has 19 mcg.  Pickles - 1 spear has 14 mcg.  Pumpkin seeds - 1 oz. has 13 mcg.  Sauerkraut (canned) -  cup has 16 mcg.  Soybeans (cooked) -  cup has 16 mcg.  Tomato (raw) - 1 medium has 10 mcg.  Tomato sauce -  cup has 17 mcg.  Vitamin K-free foods If a food contain less than 5 mcg per serving, it is considered to have no vitamin K. These foods include:  Bread and cereal products.  Cheese.  Eggs.  Fish and shellfish.  Meat and poultry.  Milk and dairy products.  Sunflower seeds.  Actual amounts of vitamin K in foods may be different depending on processing. Talk with your dietitian about what foods you can eat and what foods you should avoid. This information is not intended to replace advice given to you by your health care provider. Make sure you discuss any questions you have with your health care provider. Document Released: 05/07/2009 Document Revised: 01/30/2016 Document Reviewed: 10/13/2015 Elsevier Interactive Patient Education  2017 [employer] Inc[employer]    Warfarin Coagulopathy Warfarin (Coumadin) coagulopathy refers to bleeding that may occur as a complication of the medicine warfarin. Warfarin is an oral blood thinner (anticoagulant). Warfarin is used for medical conditions where thinning of the blood is needed to prevent blood clots. What are the causes? Bleeding is the most common and most serious complication of warfarin. The amount of bleeding is related to the warfarin dose and length of treatment. In  addition, bleeding complications can also occur due to:  Intentional or accidental warfarin overdose.  Underlying medical conditions.  Dietary changes.  Medicine, herbal, supplement, or alcohol interactions.  What are the signs or symptoms? Severe bleeding while on warfarin may occur from any tissue or organ. Symptoms of the blood being too thin may include:  Bleeding from the nose or gums.  Blood in bowel movements which may appear as bright red, dark, or black tarry stools.  Blood in the urine which may appear as pink, red, or brown urine.  Unusual bruising or bruising easily.  A cut that does not stop bleeding within 10 minutes.  Vomiting blood or continuous nausea for more than 1 day.  Coughing up blood.  Broken blood vessels in your eye (subconjunctival hemorrhage).  Abdominal or back pain with or without flank bruising.  Sudden, severe headache.  Sudden weakness or numbness of the face, arm, or leg, especially on one side of the body.  Sudden confusion.  Trouble speaking (aphasia) or understanding.  Sudden trouble seeing in one or both eyes.  Sudden trouble walking.  Dizziness.  Loss of balance or coordination.  Vaginal bleeding.  Swelling or pain at an injection site.  Superficial fat tissue death (necrosis) which may cause skin scarring. This is more common in women and may first present as pain in the waist, thighs, or buttocks.  Follow these instructions at home:  Always contact your health care provider of any concerns or signs of possible warfarin coagulopathy as soon as possible.  Take warfarin exactly as directed by your health care provider. It is recommended that  you take your warfarin dose at the same time of the day. If you have been told to stop taking warfarin, do not resume taking warfarin until directed to do so by your health care provider. Follow your health care provider's instructions if you accidentally take an extra dose or miss a  dose of warfarin. It is very important to take warfarin as directed since bleeding or blood clots could result in chronic or permanent injury, pain, or disability.  Keep all follow-up appointments with your health care provider as directed. It is very important to keep your appointments. Not keeping appointments could result in a chronic or permanent injury, pain, or disability because warfarin is a medicine that requires close monitoring.  While taking warfarin, you will need to have regular blood tests to measure your blood clotting time. These blood tests usually include both the prothrombin time (PT) and International Normalized Ratio (INR) tests. The PT and INR results allow your health care provider to adjust your dose of warfarin. The dose can change for many reasons. It is critically important that you have your PT and INR levels drawn exactly as directed. Your warfarin dose may stay the same or change depending on what the PT and INR results are. Be sure to follow up with your health care provider regarding your PT and INR test results and what your warfarin dosage should be.  Many medicines can interfere with warfarin and affect the PT and INR results. You must tell your health care provider about any and all medicines you take. This includes all vitamins and supplements. Ask your health care provider before taking these. Prescription and over-the-counter medicine consistency is critical to warfarin management. It is important that potential interactions are checked before you start a new medicine. Be especially cautious with aspirin and anti-inflammatory medicines. Ask your health care provider before taking these. Medicines such as antibiotics and acid-reducing medicine can interact with warfarin and can cause an increased warfarin effect. Warfarin can also interfere with the effectiveness of medicines you are taking. Do not take or discontinue any prescribed or over-the-counter medicine except on  the advice of your health care provider or pharmacist.  Some vitamins, supplements, and herbal products interfere with the effectiveness of warfarin. Vitamin E may increase the anticoagulant effects of warfarin. Vitamin K can cause warfarin to be less effective. Do not take or discontinue any vitamin, supplement, or herbal product except on the advice of your health care provider or pharmacist.  Eat what you normally eat and keep the vitamin K content of your diet consistent. Avoid major changes in your diet, or notify your health care provider before changing your diet. Suddenly getting a lot more vitamin K could cause your blood to clot too quickly. A sudden decrease in vitamin K intake could cause your blood to clot too slowly. These changes in vitamin K intake could lead to dangerous blood clotsor to bleeding. To keep your vitamin K intake consistent, you must be aware of which foods contain moderate or high amounts of vitamin K. Some foods that are high in vitamin K include spinach, kale, broccoli, cabbage, greens, Brussels sprouts, asparagus, bok choy, coleslaw, and parsley. If you drink green tea, drink the same amount each day. Arrange a visit with a dietitian to answer your questions.  If you have a loss of appetite or get the stomach flu (viral gastroenteritis), talk to your health care provider as soon as possible. A decrease in your normal vitamin K intake can  make you more sensitive to your usual dose of warfarin.  Some medical conditions may increase your risk for bleeding while you are taking warfarin. A fever, diarrhea lasting more than a day, worsening heart failure, or worsening liver function are some medical conditions that could affect warfarin. Contact your health care provider if you have any of these medical conditions.  Be careful not to cut yourself when using sharp objects or while shaving.  Alcohol can change the body's ability to handle warfarin. It is best to avoid  alcoholic drinks or consume only very small amounts while taking warfarin. Notify your health care provider if you change your alcohol intake. A sudden increase in alcohol use can increase your risk of bleeding. Chronic alcohol use can cause warfarin to be less effective.  Limit physical activities or sports that could result in a fall or cause injury.  Do not use warfarin if you are pregnant.  Inform all your health care providers and your dentist that you take warfarin.  Inform all health care providers if you are taking warfarin and aspirin or platelet inhibitor medicines such as clopidogrel, ticagrelor, or prasugrel. Use of these medicines in addition to warfarin can increase your risk of bleeding or death. Taking these medicines together should only be done under the direct care of your health care providers. Get help right away if:  You cough up blood.  You have dark or black stools or there is bright red blood coming from your rectum.  You vomit blood or have nausea for more than 1 day.  You have blood in the urine or pink-colored urine.  You have unusual bruising or have increased bruising.  You have bleeding from the nose or gums that does not stop quickly.  You have a cut that does not stop bleeding within 2-3 minutes.  You have sudden weakness or numbness of the face, arm, or leg, especially on one side of the body.  You have sudden confusion.  You have trouble speaking (aphasia) or understanding.  You have sudden trouble seeing in one or both eyes.  You have sudden trouble walking.  You have dizziness.  You have a loss of balance or coordination.  You have a sudden, severe headache.  You have a serious fall or head injury, even if you are not bleeding.  You have swelling or pain at an injection site.  You have unexplained tenderness or pain in the abdomen, back, waist, thighs, or buttocks. Any of these symptoms may represent a serious problem that is an  emergency. Do not wait to see if the symptoms will go away. Get medical help right away. Call your local emergency services (911 in [place] Do not drive yourself to the hospital. This information is not intended to replace advice given to you by your health care provider. Make sure you discuss any questions you have with your health care provider. Document Released: 06/18/2006 Document Revised: 12/16/2015 Document Reviewed: 12/19/2011 Elsevier Interactive Patient Education  2018 [employer]     IF you received an x-ray today, you will receive an invoice from [facility]. Please contact [facility] at [phone] with questions or concerns regarding your invoice.   IF you received labwork today, you will receive an invoice from [facility]. Please contact [facility] at [phone] with questions or concerns regarding your invoice.   Our billing staff will not be able to assist you with questions regarding bills from these companies.  You will be contacted with the lab results  as soon as they are available. The fastest way to get your results is to activate your My Chart account. Instructions are located on the last page of this paperwork. If you have not heard from [place] regarding the results in 2 weeks, please contact this office.

## 2017-02-23 LAB — PROTIME-INR
INR: 4.6 — AB (ref 0.8–1.2)
PROTHROMBIN TIME: 43.4 s — AB (ref 9.1–12.0)

## 2017-02-23 NOTE — Progress Notes (Signed)
Spoke with Dr. Shirlee MoreMoll's office in [place], they are requesting notes for reason of consult.  H&P, D/C Summary, Oncology consult note faxed to Dr. Shirlee MoreMoll's office.  They have received information to schedule referral, they will be calling patient Monday.  Colman Caterarpley, Eman Rynders Danielle

## 2017-02-26 ENCOUNTER — Emergency Department (HOSPITAL_BASED_OUTPATIENT_CLINIC_OR_DEPARTMENT_OTHER): Payer: 59

## 2017-02-26 ENCOUNTER — Emergency Department (HOSPITAL_BASED_OUTPATIENT_CLINIC_OR_DEPARTMENT_OTHER)
Admission: EM | Admit: 2017-02-26 | Discharge: 2017-02-27 | Disposition: A | Discharge status: Home / Self Care | Payer: 59 | Attending: Emergency Medicine | Admitting: Emergency Medicine

## 2017-02-26 ENCOUNTER — Encounter (HOSPITAL_BASED_OUTPATIENT_CLINIC_OR_DEPARTMENT_OTHER): Payer: Self-pay

## 2017-02-26 DIAGNOSIS — R079 Chest pain, unspecified: Secondary | ICD-10-CM | POA: Insufficient documentation

## 2017-02-26 DIAGNOSIS — R791 Abnormal coagulation profile: Secondary | ICD-10-CM | POA: Insufficient documentation

## 2017-02-26 DIAGNOSIS — G8929 Other chronic pain: Secondary | ICD-10-CM

## 2017-02-26 DIAGNOSIS — M3210 Systemic lupus erythematosus, organ or system involvement unspecified: Secondary | ICD-10-CM | POA: Diagnosis not present

## 2017-02-26 DIAGNOSIS — Z86711 Personal history of pulmonary embolism: Secondary | ICD-10-CM | POA: Insufficient documentation

## 2017-02-26 DIAGNOSIS — Z7901 Long term (current) use of anticoagulants: Secondary | ICD-10-CM | POA: Diagnosis not present

## 2017-02-26 DIAGNOSIS — J45909 Unspecified asthma, uncomplicated: Secondary | ICD-10-CM | POA: Insufficient documentation

## 2017-02-26 DIAGNOSIS — R0602 Shortness of breath: Secondary | ICD-10-CM | POA: Diagnosis not present

## 2017-02-26 LAB — PROTIME-INR
INR: 1.88
Prothrombin Time: 21.9 seconds — ABNORMAL HIGH (ref 11.4–15.2)

## 2017-02-26 LAB — BASIC METABOLIC PANEL
Anion gap: 10 (ref 5–15)
BUN: 11 mg/dL (ref 6–20)
CALCIUM: 9 mg/dL (ref 8.9–10.3)
CO2: 24 mmol/L (ref 22–32)
Chloride: 103 mmol/L (ref 101–111)
Creatinine, Ser: 1.01 mg/dL — ABNORMAL HIGH (ref 0.44–1.00)
GFR calc Af Amer: >60 mL/min (ref >60)
GLUCOSE: 97 mg/dL (ref 65–99)
Potassium: 3.6 mmol/L (ref 3.5–5.1)
Sodium: 137 mmol/L (ref 135–145)

## 2017-02-26 LAB — CBC
HCT: 37.6 % (ref 36.0–46.0)
HEMOGLOBIN: 12.4 g/dL (ref 12.0–15.0)
MCH: 27.1 pg (ref 26.0–34.0)
MCHC: 33 g/dL (ref 30.0–36.0)
MCV: 82.1 fL (ref 78.0–100.0)
Platelets: 377 10*3/uL (ref 150–400)
RBC: 4.58 MIL/uL (ref 3.87–5.11)
RDW: 14.2 % (ref 11.5–15.5)
WBC: 6.2 10*3/uL (ref 4.0–10.5)

## 2017-02-26 LAB — TROPONIN I

## 2017-02-26 LAB — OCCULT BLOOD X 1 CARD TO LAB, STOOL: Fecal Occult Bld: NEGATIVE

## 2017-02-26 LAB — PREGNANCY, URINE: PREG TEST UR: NEGATIVE

## 2017-02-26 MED ORDER — IOPAMIDOL (ISOVUE-370) INJECTION 76%: 100.0000 mL | Freq: Once | INTRAVENOUS | Status: DC | PRN

## 2017-02-26 MED ORDER — KETOROLAC TROMETHAMINE 30 MG/ML IJ SOLN
30.0000 mg | Freq: Once | INTRAMUSCULAR | Status: AC
  Administered 2017-02-26: 30 mg via INTRAVENOUS
  Filled 2017-02-26: qty 1

## 2017-02-26 MED ORDER — SODIUM CHLORIDE 0.9 % IV BOLUS (SEPSIS)
1000.0000 mL | Freq: Once | INTRAVENOUS | Status: AC
  Administered 2017-02-26: 1000 mL via INTRAVENOUS

## 2017-02-26 NOTE — ED Triage Notes (Signed)
C/o CP started last night-NAD-steady gait 

## 2017-02-26 NOTE — ED Provider Notes (Signed)
By signing my name below, I, Hayley Webb, attest that this documentation has been prepared under the direction and in the presence of physician practitioner, Albertus Chiarelli, Layla Maw, DO. Electronically Signed: Linna Webb, Scribe. 02/26/2017. 11:33 PM.  TIME SEEN: 11:14 PM  CHIEF COMPLAINT: Chest Pain  HPI: HPI Comments: Hayley Webb is a 31 y.o. female with PMHx including lupus, PE who presents to the Emergency Department complaining of persistent sharp, severe L sided chest pain without radiation beginning earlier today. She reports some associated mild dyspnea, dizziness, and cold sweats. She notes that she had blood in her stool three days ago as well as this morning. There are no modifying factors noted. Patient has been using previously prescribed Percocet, Tramadol, and Mobic at home without relief. Patient states that her current chest pain feels worse than the CP she experienced with her recent PE's, as described below. She denies fevers, cough, LE swelling or pain, melena, or any other associated symptoms.  Per chart review, patient was diagnosed with a "tiny nonocclusive pulmonary emboli noted to the lower lobes" and borderline right heart strain on 12/26/16 and placed on Xarelto. The patient returned to the ED on 12/29/2016 for worsening shortness of breath and was diagnosed with a worsening clot burden by CT (new PE seen in right upper lobe pulmonary arterial branch). She was subsequently switched from Xarelto to Lovenox BID.  She was evaluated in the ED on 6/15 and received another CT which showed no worsening clot burden but radiology reported very suboptimal contrast opacification of pulmonary arterial system.  Patient was seen by her primary care physician on 7/2 and had a workup positive for lupus anticoagulant. Patient switched from Lovenox to Eliquis at that time. Patient seen again in the ED on July 12 and had a CT scan which showed no new or old pulmonary emboli.  On 7/20 she again returned  to the emergency department for chest pain and had another CT scan that demonstrated "tiny strand-like filling defects within left lower lobe pulmonary arteries consistent with nonocclusive pulmonary emboli".  Patient was switched from Eliquis to Coumadin while she was admitted to the hospital. During her admission she had another CT scan on 7/29 showed "no definite evidence of pulmonary embolus is seen currently". Patient was discharged from the hospital on 7/30. She is followed by an anticoagulation specialist at [facility].  Patient has had 4 negative venous Dopplers of her lower extremities on the 6/5, 6/10, 6/12, 7/24. She has had normal echocardiograms on 6/11 and 7/23 with EF of 60-65% with no wall motion abnormalities, normal diastolic function.  It appears on previous hospitalizations, it was thought that some of her pain was musculoskeletal. She was started on moving since Toradol has helped her pain the most in the past.  ROS: See HPI Constitutional: no fever  Eyes: no drainage  ENT: no runny nose   Cardiovascular:  chest pain  Resp: SOB  GI: no vomiting GU: no dysuria Integumentary: no rash  Allergy: no hives  Musculoskeletal: no leg swelling  Neurological: no slurred speech ROS otherwise negative  PAST MEDICAL HISTORY/PAST SURGICAL HISTORY:  Past Medical History:  Diagnosis Date  . Anxiety    doing good now  . Asthma   . Headache(784.0)   . HSV infection   . Infection    UTI  . Lupus    dx age 23  . Pulmonary embolism (HCC) 06/4/82011  . STD (sexually transmitted disease) 02/2009   POSITIVE GC  MEDICATIONS:  Prior to Admission medications   Medication Sig Start Date End Date Taking? Authorizing Provider  acetaminophen (TYLENOL) 500 MG tablet Take 1,000 mg by mouth every 6 (six) hours as needed for headache (pain).     [provider]  albuterol (PROVENTIL HFA;VENTOLIN HFA) 108 (90 Base) MCG/ACT inhaler Inhale 2 puffs into the lungs every 6 (six)  hours as needed for wheezing or shortness of breath.    [provider]  albuterol (PROVENTIL) (2.5 MG/3ML) 0.083% nebulizer solution Take 2.5 mg by nebulization every 6 (six) hours as needed for wheezing or shortness of breath.    [provider]  hydroxychloroquine (PLAQUENIL) 200 MG tablet Take 200 mg by mouth daily.    [provider]  meloxicam (MOBIC) 7.5 MG tablet Take 1 tablet (7.5 mg total) by mouth 2 (two) times daily. 02/19/17   Calvert Cantor, MD  methylPREDNISolone (MEDROL) 4 MG tablet Take 4 mg by mouth daily.    [provider]  Multiple Vitamin (MULTIVITAMIN WITH MINERALS) TABS tablet Take 1 tablet by mouth daily.    [provider]  oxyCODONE-acetaminophen (PERCOCET/ROXICET) 5-325 MG tablet Take 1 tablet by mouth every 6 (six) hours as needed for moderate pain. 01/02/17   Regalado, Belkys A, MD  senna (SENOKOT) 8.6 MG TABS tablet Take 1 tablet (8.6 mg total) by mouth 2 (two) times daily. 01/02/17   Regalado, Belkys A, MD  traMADol (ULTRAM) 50 MG tablet Take 1-2 tablets (50-100 mg total) by mouth every 6 (six) hours as needed for severe pain. 02/19/17   Calvert Cantor, MD  warfarin (COUMADIN) 2.5 MG tablet 15 mg daily to be adjusted when you have your next INR in 2 days. 02/19/17   Calvert Cantor, MD    ALLERGIES:  Allergies  Allergen Reactions  . Bactrim Anaphylaxis and Hives  . Prednisone Anaphylaxis and Hives    Has been on Dexamethasone without issue.  . Nsaids Other (See Comments)    Has Lupus, reccommended to avoid NSAIDs  . Other Itching    Shrimp:throat itching "can eat other shellfish"  . Pineapple Itching  . Procardia [Nifedipine] Swelling    Swelling of tongue  . Vicodin [Hydrocodone-Acetaminophen] Hives and Swelling    Can take Percocet without difficulty    SOCIAL HISTORY:  Social History  Substance Use Topics  . Smoking status: Never Smoker  . Smokeless tobacco: Never Used  . Alcohol use No    FAMILY  HISTORY: Family History  Problem Relation Age of Onset  . Diabetes Maternal Aunt   . Cancer Maternal Grandmother 72       COLON CA  . Diabetes Maternal Grandmother   . Hypertension Maternal Grandfather   . Cancer Maternal Grandfather        prostate  . Asthma Mother   . Asthma Brother   . Cancer Paternal Grandmother        breast  . Lupus Paternal Grandmother   . Anesthesia problems Neg Hx   . Hypotension Neg Hx   . Malignant hyperthermia Neg Hx   . Pseudochol deficiency Neg Hx     EXAM: BP 130/83   Pulse 89   Temp 98.2 F (36.8 C) (Oral)   Resp (!) 21   Ht 5\' 8"  (1.727 m)   Wt (!) 335 lb 1.6 oz (152 kg)   LMP 01/16/2017 Comment: (-) urine pregnancy test//a.c.  SpO2 100%   BMI 50.95 kg/m  CONSTITUTIONAL: Alert and oriented and responds appropriately to questions. Well-appearing; well-nourished;  obese HEAD: Normocephalic EYES: Conjunctivae clear, pupils appear equal, EOMI ENT: normal nose; moist mucous membranes NECK: Supple, no meningismus, no nuchal rigidity, no LAD  CARD: RRR; S1 and S2 appreciated; no murmurs, no clicks, no rubs, no gallops CHEST:  Chest wall is nontender to palpation.  No crepitus, ecchymosis, erythema, warmth, rash or other lesions present.   RESP: Normal chest excursion without splinting or tachypnea; breath sounds clear and equal bilaterally; no wheezes, no rhonchi, no rales, no hypoxia or respiratory distress, speaking full sentences ABD/GI: Normal bowel sounds; non-distended; soft, non-tender, no rebound, no guarding, no peritoneal signs, no hepatosplenomegaly RECTAL:  Normal rectal tone, no gross blood or melena, guaiac negative, no hemorrhoids appreciated, nontender rectal exam, no fecal impaction BACK:  The back appears normal and is non-tender to palpation, there is no CVA tenderness EXT: Normal ROM in all joints; non-tender to palpation; no edema; normal capillary refill; no cyanosis, no calf tenderness or swelling    SKIN: Normal color for  age and race; warm; no rash NEURO: Moves all extremities equally PSYCH: The patient's mood and manner are appropriate. Grooming and personal hygiene are appropriate.  MEDICAL DECISION MAKING: Patient here with complaints of worsening chest pain today. Patient has been diagnosed with multiple small pulmonary emboli and has been switched from Xarelto to Lovenox to Eliquis and now on Coumadin. Blood work ordered in triage is unremarkable except for subtherapeutic INR at 1.88. Chest x-ray clear.  EKG shows no ischemic abnormality, arrhythmia. I have had a lengthy discussion with patient and her mother at bedside regarding the risk of repeat CT imaging given she has had now 5 CT scans in 2 months.  Patient and her mother are both aware that there is increased risk of cancer with repeated radiation exposure.  Patient initially states that this is new pain for her but after talking to her repeatedly, it seemed that this is more chronic pain that she has been having intermittently since June. She has been hemodynamically stable here. Will start with a d-dimer and if this is negative I feel we can see that she has a low probability for a clinically significant PE especially given she just had a completely negative CT scan on July 29 and has had repeated negative bilateral lower extremity venous Dopplers and normal echocardiograms. Patient and her mother are both comfortable with this plan. We'll give Toradol given she reports this has helped her before, IV fluids. She does report she had some blood in her stool over the past 3 days. She is guaiac negative currently.  Pregnancy test is negative.  ED PROGRESS: D-dimer is negative. Pain has significant improved with Toradol and she remains hemodynamically stable with no hypoxia or tachycardia. We've again discussed at length risk and benefits of repeat CT imaging and patient and mother verbalizes understanding and are pleased with the plan to hold off on CT scan and  follow-up with her outpatient doctor. Given that her d-dimer is negative and she is hemodynamically stable and already on anticoagulation, I do not feel that if she had a pulmonary embolus that would be a clinically significant one. She has an appointment with her primary care physician at 9 AM in the morning. Have given her a dose of Lovenox given she is subtherapeutic with her INR today and she will follow-up with her PCP in the morning to figure out what to do from there. She states she has tramadol, Percocet and mobic at home for pain control. We have discussed  at length that if she has new chest pain, difficulty breathing, feeling like she may pass out or she does pass out but she needs to return to the emergency department immediately.   At this time, I do not feel there is any life-threatening condition present. I have reviewed and discussed all results (EKG, imaging, lab, urine as appropriate) and exam findings with patient/family. I have reviewed nursing notes and appropriate previous records.  I feel the patient is safe to be discharged home without further emergent workup and can continue workup as an outpatient as needed. Discussed usual and customary return precautions. Patient/family verbalize understanding and are comfortable with this plan.  Outpatient follow-up has been provided if needed. All questions have been answered.    EKG Interpretation  Date/Time:  Monday February 26 2017 22:11:20 EDT Ventricular Rate:  99 PR Interval:  154 QRS Duration: 94 QT Interval:  370 QTC Calculation: 474 R Axis:   40 Text Interpretation:  Normal sinus rhythm Normal ECG No significant change since last tracing Confirmed by Rochele Raring [phone]) on 02/26/2017 10:57:15 PM        I personally performed the services described in this documentation, which was scribed in my presence. The recorded information has been reviewed and is accurate.    Reegan Bouffard, Layla Maw, DO 02/27/17 [phone]

## 2017-02-26 NOTE — ED Notes (Signed)
Pt c/o left side chest pain since last night that is typical of her PE pain.  She tried percocet this morning and did not have relief from the pain.  Pt is compliant with coumadin and denies missing any doses.

## 2017-02-27 ENCOUNTER — Ambulatory Visit (INDEPENDENT_AMBULATORY_CARE_PROVIDER_SITE_OTHER): Payer: 59 | Admitting: Physician Assistant

## 2017-02-27 ENCOUNTER — Encounter: Payer: Self-pay | Admitting: Physician Assistant

## 2017-02-27 VITALS — BP 136/87 | HR 98 | Temp 98.9°F | Resp 18 | Ht 68.0 in | Wt 336.2 lb

## 2017-02-27 DIAGNOSIS — Z5181 Encounter for therapeutic drug level monitoring: Secondary | ICD-10-CM

## 2017-02-27 DIAGNOSIS — Z7901 Long term (current) use of anticoagulants: Secondary | ICD-10-CM

## 2017-02-27 LAB — D-DIMER, QUANTITATIVE: D-Dimer, Quant: 0.27 ug/mL-FEU (ref 0.00–0.50)

## 2017-02-27 MED ORDER — ENOXAPARIN SODIUM 150 MG/ML ~~LOC~~ SOLN
1.5000 mg/kg | Freq: Once | SUBCUTANEOUS | Status: AC
  Administered 2017-02-27: 230 mg via SUBCUTANEOUS
  Filled 2017-02-27: qty 2

## 2017-02-27 NOTE — Progress Notes (Signed)
[facility] [address], [place] [phone]  Date:  02/27/2017   Name:  Hayley Webb   DOB:  02/11/1986   MRN:  [id number]  PCP:  Garnetta BuddyEnglish, Recia Sons D, PA    History of Present Illness:  Hayley Webb is a 31 y.o. female patient who presents to PCP with  Chief Complaint  Patient presents with  . Hospitalization Follow-up    pt was admitted to the hospital on 02/09/2017 and was released on 02/19/2017 for Chest pain and went back to the ED yesterday for chest pain. Pt states hospital found another embollism on 02/09/2017. The Dr at the ED didn't want to do another CT because pt has had so many and wanted pt to talk to her pcp for follow up care. Pt states she is having slight chest pain and that it isn't bad but noticeable.     Patient reports that she is feeling slightly better.  She was placed on coumadin.  She had chest pain yesterday but this has resolved.  She is taking the meloxicam once per day consistently. Taking the warfarin as prescribed at 15mg  at this time.  She is following warfarin friendly diet.    Patient Active Problem List   Diagnosis Date Noted  . Antiphospholipid antibody syndrome (HCC) 02/10/2017  . Pulmonary embolus (HCC) 02/09/2017  . Pulmonary embolism (HCC) 12/26/2016  . Shortness of breath   . Vaginal delivery--VE assist 10/25/2013  . 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 Obesity-BMI 47 05/09/2013  . Hirsutism 10/02/2012  . STD (female) 01/18/2012  . Lupus   . Asthma   . HSV infection     Past Medical History:  Diagnosis Date  . Anxiety    doing good now  . Asthma   . Headache(784.0)   . HSV infection   . Infection    UTI  . Lupus    dx age 31  . Pulmonary embolism (HCC) 06/4/82011  . 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 MOUTH SURGERY     2 teeth removed- 1 wisdom and  1 in front of it  . TONSILLECTOMY AND ADENOIDECTOMY  1998  . WISDOM TOOTH EXTRACTION      Social History  Substance Use Topics  . Smoking status: Never Smoker  . Smokeless tobacco: Never Used  . Alcohol use No    Family History  Problem Relation Age of Onset  . Diabetes Maternal Aunt   . Cancer Maternal Grandmother 72       COLON CA  . Diabetes Maternal Grandmother   . Hypertension Maternal Grandfather   . Cancer Maternal Grandfather        prostate  . Asthma Mother   . Asthma Brother   . Cancer Paternal Grandmother        breast  . Lupus Paternal Grandmother   . Anesthesia problems Neg Hx   . Hypotension Neg Hx   . Malignant hyperthermia Neg Hx   . Pseudochol deficiency Neg Hx     Allergies  Allergen Reactions  . Bactrim Anaphylaxis and Hives  . Prednisone Anaphylaxis and Hives    Has been on Dexamethasone without issue.  . Nsaids Other (See Comments)    Has Lupus, reccommended to avoid NSAIDs  . Other Itching    Shrimp:throat itching "can eat other shellfish"  . Pineapple Itching  .  Procardia [Nifedipine] Swelling    Swelling of tongue  . Vicodin [Hydrocodone-Acetaminophen] Hives and Swelling    Can take Percocet without difficulty    Medication list has been reviewed and updated.  Current Outpatient Prescriptions on File Prior to Visit  Medication Sig Dispense Refill  . acetaminophen (TYLENOL) 500 MG tablet Take 1,000 mg by mouth every 6 (six) hours as needed for headache (pain).     Marland Kitchen albuterol (PROVENTIL HFA;VENTOLIN HFA) 108 (90 Base) MCG/ACT inhaler Inhale 2 puffs into the lungs every 6 (six) hours as needed for wheezing or shortness of breath.    Marland Kitchen albuterol (PROVENTIL) (2.5 MG/3ML) 0.083% nebulizer solution Take 2.5 mg by nebulization every 6 (six) hours as needed for wheezing or shortness of breath.    . hydroxychloroquine (PLAQUENIL) 200 MG tablet Take 200 mg by mouth daily.    . meloxicam (MOBIC) 7.5 MG tablet Take 1 tablet (7.5 mg total) by mouth  2 (two) times daily. 60 tablet 0  . methylPREDNISolone (MEDROL) 4 MG tablet Take 4 mg by mouth daily.    . Multiple Vitamin (MULTIVITAMIN WITH MINERALS) TABS tablet Take 1 tablet by mouth daily.    Marland Kitchen oxyCODONE-acetaminophen (PERCOCET/ROXICET) 5-325 MG tablet Take 1 tablet by mouth every 6 (six) hours as needed for moderate pain. 30 tablet 0  . senna (SENOKOT) 8.6 MG TABS tablet Take 1 tablet (8.6 mg total) by mouth 2 (two) times daily. 120 each 0  . traMADol (ULTRAM) 50 MG tablet Take 1-2 tablets (50-100 mg total) by mouth every 6 (six) hours as needed for severe pain. 30 tablet 0  . warfarin (COUMADIN) 2.5 MG tablet 15 mg daily to be adjusted when you have your next INR in 2 days. 60 tablet 0   No current facility-administered medications on file prior to visit.     ROS ROS otherwise unremarkable unless listed above.  Physical Examination: BP 136/87 (BP Location: Right Arm, Patient Position: Sitting, Cuff Size: Large)   Pulse 98   Temp 98.9 F (37.2 C) (Oral)   Resp 18   Ht 5\' 8"  (1.727 m)   Wt (!) 336 lb 3.2 oz (152.5 kg)   LMP 01/16/2017   SpO2 97%   BMI 51.12 kg/m  Ideal Body Weight: Weight in (lb) to have BMI = 25: 164.1  Physical Exam  Constitutional: She is oriented to person, place, and time. She appears well-developed and well-nourished. No distress.  HENT:  Head: Normocephalic and atraumatic.  Right Ear: External ear normal.  Left Ear: External ear normal.  Eyes: Pupils are equal, round, and reactive to light. Conjunctivae and EOM are normal.  Cardiovascular: Normal rate.   Pulmonary/Chest: Effort normal. No respiratory distress.  Neurological: She is alert and oriented to person, place, and time.  Skin: She is not diaphoretic.  Psychiatric: She has a normal mood and affect. Her behavior is normal.     Assessment and Plan: Karmina Zufall is a 31 y.o. female who is here today of hospital follow up. At this time, this is stable. rtc for repeat pt inr  thursday Discussed the meloxicam taking daily--not prn, as this may effect the coumadin.   Contacted anti-coagulation clinic of [place], but they will not follow coumadin unless followed by a cardiologist. Anticoagulated on Coumadin - Plan: Protime-INR, CANCELED: Protime-INR  Trena Platt, PA-C [facility] 8/7/20188:41 PM

## 2017-02-27 NOTE — ED Notes (Signed)
Pt verbalizes understanding of d/c instructions and denies any further needs at this time. 

## 2017-02-27 NOTE — Patient Instructions (Signed)
     IF you received an x-ray today, you will receive an invoice from [facility]. Please contact [facility] at [phone] with questions or concerns regarding your invoice.   IF you received labwork today, you will receive an invoice from [facility]. Please contact [facility] at [phone] with questions or concerns regarding your invoice.   Our billing staff will not be able to assist you with questions regarding bills from these companies.  You will be contacted with the lab results as soon as they are available. The fastest way to get your results is to activate your My Chart account. Instructions are located on the last page of this paperwork. If you have not heard from us regarding the results in 2 weeks, please contact this office.     

## 2017-02-27 NOTE — Discharge Instructions (Signed)
Please follow-up with your primary care physician tomorrow as scheduled to discuss adjusting your Coumadin. We have given you a dose of Lovenox here to bridge you since your Coumadin level was slightly low at 1.88.  Your labs today, chest x-ray and EKG were normal. Given your vital signs were normal and you had a negative d-dimer, recent negative CT chest (02/18/17), recent normal echocardiogram and Dopplers of both of your legs, we have decided to hold off on further CT imaging as your high risk for cancer given significant radiation exposure from multiple recent CT scans.  Please continue your pain medication as prescribed.

## 2017-02-28 ENCOUNTER — Telehealth: Payer: Self-pay | Admitting: Physician Assistant

## 2017-02-28 NOTE — Telephone Encounter (Signed)
Patient needs disability forms updated for her leave. States she needs these forms completed and to make sure we included her intermittent leave time. She states she is working but not back to full work status. I do not see anything in the notes that we have put her on light duty or anything so I was not sure how to complete these forms.   I will place these forms in Stephanie's box on 02/28/17 please complete them and return them to the FMLA/Disability box at the [employer] checkout desk within 5-7 business days. Thank you!

## 2017-03-01 ENCOUNTER — Ambulatory Visit: Payer: 59 | Admitting: Physician Assistant

## 2017-03-01 DIAGNOSIS — Z7901 Long term (current) use of anticoagulants: Secondary | ICD-10-CM

## 2017-03-01 DIAGNOSIS — Z5181 Encounter for therapeutic drug level monitoring: Secondary | ICD-10-CM | POA: Diagnosis not present

## 2017-03-01 LAB — PROTIME-INR
INR: 1.5 — ABNORMAL HIGH (ref 0.8–1.2)
PROTHROMBIN TIME: 16.2 s — AB (ref 9.1–12.0)

## 2017-03-02 ENCOUNTER — Telehealth: Payer: Self-pay | Admitting: Physician Assistant

## 2017-03-02 NOTE — Telephone Encounter (Signed)
Patient needs her PT/INR results and also [facility] for [facility] [address]. Patient did not tell me the name of the medication.  Her call back number is [phone]

## 2017-03-04 MED ORDER — WARFARIN SODIUM 10 MG PO TABS: ORAL_TABLET | ORAL | 1 refills | Status: DC

## 2017-03-05 ENCOUNTER — Ambulatory Visit: Payer: 59 | Admitting: Physician Assistant

## 2017-03-05 NOTE — Telephone Encounter (Signed)
Paperwork completed, scanned and faxed on 03/05/17

## 2017-03-05 NOTE — Telephone Encounter (Signed)
Thanks

## 2017-03-08 ENCOUNTER — Telehealth: Payer: Self-pay | Admitting: Physician Assistant

## 2017-03-08 DIAGNOSIS — Z7901 Long term (current) use of anticoagulants: Secondary | ICD-10-CM

## 2017-03-08 NOTE — Telephone Encounter (Signed)
Pt is needing to talk with Judeth CornfieldStephanie regarding her next INR testing that needs to be done   Best number [phone]

## 2017-03-09 ENCOUNTER — Ambulatory Visit (INDEPENDENT_AMBULATORY_CARE_PROVIDER_SITE_OTHER): Payer: 59 | Admitting: Physician Assistant

## 2017-03-09 DIAGNOSIS — Z5181 Encounter for therapeutic drug level monitoring: Secondary | ICD-10-CM | POA: Diagnosis not present

## 2017-03-09 DIAGNOSIS — Z7901 Long term (current) use of anticoagulants: Secondary | ICD-10-CM | POA: Diagnosis not present

## 2017-03-09 LAB — PROTIME-INR
INR: 4.5 — ABNORMAL HIGH (ref 0.8–1.2)
Prothrombin Time: 42.8 s — ABNORMAL HIGH (ref 9.1–12.0)

## 2017-03-09 NOTE — Addendum Note (Signed)
Addended by: Isaac Bliss on: 03/09/2017 12:37 PM   Modules accepted: Orders

## 2017-03-09 NOTE — Telephone Encounter (Signed)
Pt already advised.  

## 2017-03-09 NOTE — Telephone Encounter (Signed)
She was to come in for pr inr Wednesday. Will recheck labs today. If she comes in, in future-please add stat pt inr while this is controlled by [place].

## 2017-03-09 NOTE — Telephone Encounter (Signed)
Spoke with pt and advised to come to clinic and be seen for repeat INR testing with any provider. Pt is currently subtherapeutic. Please advise.

## 2017-03-10 ENCOUNTER — Ambulatory Visit: Payer: 59 | Admitting: Physician Assistant

## 2017-03-10 NOTE — Progress Notes (Signed)
Lab only not seen by me. Deliah Boston, MS, PA-C 11:26 AM, 03/10/2017

## 2017-03-10 NOTE — Telephone Encounter (Signed)
Pt is returning stephanie's call. Please advise when back in the office

## 2017-03-14 MED ORDER — WARFARIN SODIUM 5 MG PO TABS: ORAL_TABLET | ORAL | 1 refills | Status: DC

## 2017-03-14 NOTE — Telephone Encounter (Signed)
On Sunday, 03/10/2017, patient was advised to switch coumadin form 15, 20 alternately to 15 and 17.5 intermittently.  5mg  tablets were ordered with her pharmacy.  She is to return for pt inr on Thursday, 03/15/2017

## 2017-03-19 DIAGNOSIS — Z0271 Encounter for disability determination: Secondary | ICD-10-CM

## 2017-03-20 ENCOUNTER — Telehealth: Payer: Self-pay | Admitting: Physician Assistant

## 2017-03-20 DIAGNOSIS — Z7901 Long term (current) use of anticoagulants: Secondary | ICD-10-CM

## 2017-03-21 ENCOUNTER — Ambulatory Visit: Payer: 59 | Admitting: Physician Assistant

## 2017-03-22 ENCOUNTER — Ambulatory Visit (INDEPENDENT_AMBULATORY_CARE_PROVIDER_SITE_OTHER): Payer: 59 | Admitting: Physician Assistant

## 2017-03-22 ENCOUNTER — Encounter: Payer: Self-pay | Admitting: Physician Assistant

## 2017-03-22 VITALS — BP 139/90 | HR 103 | Temp 97.8°F | Resp 16 | Ht 68.0 in | Wt 330.0 lb

## 2017-03-22 DIAGNOSIS — R079 Chest pain, unspecified: Secondary | ICD-10-CM

## 2017-03-22 DIAGNOSIS — R42 Dizziness and giddiness: Secondary | ICD-10-CM

## 2017-03-22 DIAGNOSIS — Z7901 Long term (current) use of anticoagulants: Secondary | ICD-10-CM | POA: Diagnosis not present

## 2017-03-22 DIAGNOSIS — I2699 Other pulmonary embolism without acute cor pulmonale: Secondary | ICD-10-CM | POA: Diagnosis not present

## 2017-03-22 DIAGNOSIS — Z5181 Encounter for therapeutic drug level monitoring: Secondary | ICD-10-CM

## 2017-03-22 DIAGNOSIS — R0602 Shortness of breath: Secondary | ICD-10-CM

## 2017-03-22 LAB — POCT CBC
GRANULOCYTE PERCENT: 53.3 % (ref 37–80)
HEMATOCRIT: 35.7 % — AB (ref 37.7–47.9)
HEMOGLOBIN: 11.7 g/dL — AB (ref 12.2–16.2)
Lymph, poc: 2.1 (ref 0.6–3.4)
MCH: 26.7 pg — AB (ref 27–31.2)
MCHC: 32.7 g/dL (ref 31.8–35.4)
MCV: 81.7 fL (ref 80–97)
MID (cbc): 0.3 (ref 0–0.9)
MPV: 6.8 fL (ref 0–99.8)
PLATELET COUNT, POC: 364 10*3/uL (ref 142–424)
POC GRANULOCYTE: 2.7 (ref 2–6.9)
POC LYMPH %: 41.1 % (ref 10–50)
POC MID %: 5.6 %M (ref 0–12)
RBC: 4.37 M/uL (ref 4.04–5.48)
RDW, POC: 15.1 %
WBC: 5 10*3/uL (ref 4.6–10.2)

## 2017-03-22 LAB — PROTIME-INR
INR: 1.8 — ABNORMAL HIGH (ref 0.8–1.2)
Prothrombin Time: 18.3 s — ABNORMAL HIGH (ref 9.1–12.0)

## 2017-03-22 NOTE — Patient Instructions (Addendum)
You have an appointment Tuesday afternoon at 03/27/2017 at 2:30pm with [facility] Cardiologist located at [address].  Please make sure you are there.  Bring with you your ID, insurance card,  and bottles of medications.  Nonspecific Chest Pain Chest pain can be caused by many different conditions. There is a chance that your pain could be related to something serious, such as a heart attack or a blood clot in your lungs. Chest pain can also be caused by conditions that are not life-threatening. If you have chest pain, it is very important to follow up with your doctor. Follow these instructions at home: Medicines  If you were prescribed an antibiotic medicine, take it as told by your doctor. Do not stop taking the antibiotic even if you start to feel better.  Take over-the-counter and prescription medicines only as told by your doctor. Lifestyle  Do not use any products that contain nicotine or tobacco, such as cigarettes and e-cigarettes. If you need help quitting, ask your doctor.  Do not drink alcohol.  Make lifestyle changes as told by your doctor. These may include: ? Getting regular exercise. Ask your doctor for some activities that are safe for you. ? Eating a heart-healthy diet. A diet specialist (dietitian) can help you to learn healthy eating options. ? Staying at a healthy weight. ? Managing diabetes, if needed. ? Lowering your stress, as with deep breathing or spending time in nature. General instructions  Avoid any activities that make you feel chest pain.  If your chest pain is because of heartburn: ? Raise (elevate) the head of your bed about 6 inches (15 cm). You can do this by putting blocks under the bed legs at the head of the bed. ? Do not sleep with extra pillows under your head. That does not help heartburn.  Keep all follow-up visits as told by your doctor. This is important. This includes any further testing if your chest pain does not go away. Contact a  doctor if:  Your chest pain does not go away.  You have a rash with blisters on your chest.  You have a fever.  You have chills. Get help right away if:  Your chest pain is worse.  You have a cough that gets worse, or you cough up blood.  You have very bad (severe) pain in your belly (abdomen).  You are very weak.  You pass out (faint).  You have either of these for no clear reason: ? Sudden chest discomfort. ? Sudden discomfort in your arms, back, neck, or jaw.  You have shortness of breath at any time.  You suddenly start to sweat, or your skin gets clammy.  You feel sick to your stomach (nauseous).  You throw up (vomit).  You suddenly feel light-headed or dizzy.  Your heart starts to beat fast, or it feels like it is skipping beats. These symptoms may be an emergency. Do not wait to see if the symptoms will go away. Get medical help right away. Call your local emergency services (911 in the [place] Do not drive yourself to the hospital. This information is not intended to replace advice given to you by your health care provider. Make sure you discuss any questions you have with your health care provider. Document Released: 12/27/2007 Document Revised: 04/03/2016 Document Reviewed: 04/03/2016 Elsevier Interactive Patient Education  2017 [employer].   IF you received an x-ray today, you will receive an invoice from [facility] Radiology. Please contact [facility]  Radiology at [phone] with questions or concerns regarding your invoice.   IF you received labwork today, you will receive an invoice from [facility]. Please contact [facility] at [phone] with questions or concerns regarding your invoice.   Our billing staff will not be able to assist you with questions regarding bills from these companies.  You will be contacted with the lab results as soon as they are available. The fastest way to get your results is to activate your My Chart account. Instructions are  located on the last page of this paperwork. If you have not heard from us regarding the results in 2 weeks, please contact this office.

## 2017-03-22 NOTE — Progress Notes (Signed)
PRIMARY CARE AT [place], [place] [address] [phone]  Date:  03/22/2017   Name:  Hayley Webb   DOB:  15-May-1986   MRN:  [id number]  PCP:  Joretta Bachelor, PA    History of Present Illness:  Hayley Webb is a 31 y.o. female patient who presents to PCP with  Chief Complaint  Patient presents with  . Shortness of Breath    x friday  . Chest Pain    x friday     Patient reports that she has sob and chest pain for the last 6 ddays. This is constant with about 30 minutes of resolve but then reoccurs.  There is some nausea, and dizziness at times.  This is heightened by exertion.  She is taking the coumadin compliantly with 15 and 17.80m every other day.  Despite instruction, she has been taking mobic 1-2 times per day, and had one day without for 1 day.  She has no black or bloody stool.  No regurgitation, or reflux.  The pain is pressure at the left side of her chest.  Tingling  Along the left extremity as well.   She has had ekg without acute changes in the past.  Last echocardiogram 1 month ago, was non-acute as well.   She is eating chicken and rice at this time.   She has tried the percocet and tramadol which she states just makes her sleepy.  The tramadol is more effective.    Patient Active Problem List   Diagnosis Date Noted  . Antiphospholipid antibody syndrome ([place] 02/10/2017  . Pulmonary embolus ([place] 02/09/2017  . [place] ([place] 12/26/2016  . Shortness of breath   . Vaginal delivery--VE assist 10/25/2013  . 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 Obesity-BMI 47 05/09/2013  . Hirsutism 10/02/2012  . STD (female) 01/18/2012  . Lupus   . Asthma   . HSV infection     Past Medical History:  Diagnosis Date  . Anxiety    doing good now  . Asthma   . Headache(784.0)   . HSV infection   . Infection    UTI  . Lupus    dx age 31 . [place]  [place]  . 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 MOUTH SURGERY     2 teeth removed- 1 wisdom and 1 in front of it  . TONSILLECTOMY AND ADENOIDECTOMY  1998  . WISDOM TOOTH EXTRACTION      Social History  Substance Use Topics  . Smoking status: Never Smoker  . Smokeless tobacco: Never Used  . Alcohol use No    Family History  Problem Relation Age of Onset  . Diabetes Maternal Aunt   . Cancer Maternal Grandmother 72       COLON CA  . Diabetes Maternal Grandmother   . Hypertension Maternal Grandfather   . Cancer Maternal Grandfather        prostate  . Asthma Mother   . Asthma Brother   . Cancer Paternal Grandmother        breast  . Lupus Paternal Grandmother   . Anesthesia problems Neg Hx   . Hypotension Neg Hx   . Malignant hyperthermia Neg Hx   . Pseudochol deficiency Neg Hx     Allergies  Allergen Reactions  . Bactrim Anaphylaxis and Hives  .  Prednisone Anaphylaxis and Hives    Has been on Dexamethasone without issue.  . Nsaids Other (See Comments)    Has Lupus, reccommended to avoid NSAIDs  . Other Itching    Shrimp:throat itching "can eat other shellfish"  . Pineapple Itching  . Procardia [Nifedipine] Swelling    Swelling of tongue  . Vicodin [Hydrocodone-Acetaminophen] Hives and Swelling    Can take Percocet without difficulty    Medication list has been reviewed and updated.  Current Outpatient Prescriptions on File Prior to Visit  Medication Sig Dispense Refill  . acetaminophen (TYLENOL) 500 MG tablet Take 1,000 mg by mouth every 6 (six) hours as needed for headache (pain).     Marland Kitchen albuterol (PROVENTIL HFA;VENTOLIN HFA) 108 (90 Base) MCG/ACT inhaler Inhale 2 puffs into the lungs every 6 (six) hours as needed for wheezing or shortness of breath.    Marland Kitchen albuterol (PROVENTIL) (2.5 MG/3ML) 0.083% nebulizer solution Take 2.5 mg by nebulization every 6 (six) hours as needed for  wheezing or shortness of breath.    . hydroxychloroquine (PLAQUENIL) 200 MG tablet Take 200 mg by mouth daily.    . meloxicam (MOBIC) 7.5 MG tablet Take 1 tablet (7.5 mg total) by mouth 2 (two) times daily. 60 tablet 0  . methylPREDNISolone (MEDROL) 4 MG tablet Take 4 mg by mouth daily.    . Multiple Vitamin (MULTIVITAMIN WITH MINERALS) TABS tablet Take 1 tablet by mouth daily.    Marland Kitchen oxyCODONE-acetaminophen (PERCOCET/ROXICET) 5-325 MG tablet Take 1 tablet by mouth every 6 (six) hours as needed for moderate pain. 30 tablet 0  . senna (SENOKOT) 8.6 MG TABS tablet Take 1 tablet (8.6 mg total) by mouth 2 (two) times daily. 120 each 0  . traMADol (ULTRAM) 50 MG tablet Take 1-2 tablets (50-100 mg total) by mouth every 6 (six) hours as needed for severe pain. 30 tablet 0  . warfarin (COUMADIN) 5 MG tablet Take 68m every other day, and 17.5 at the interval 120 tablet 1   No current facility-administered medications on file prior to visit.     ROS ROS otherwise unremarkable unless listed above.  Physical Examination: BP 139/90   Pulse (!) 103   Temp 97.8 F (36.6 C) (Oral)   Resp 16   Ht _0  (1.727 m)   Wt (!) 330 lb (149.7 kg)   LMP 03/05/2017   SpO2 97%   BMI 50.18 kg/m  Ideal Body Weight: Weight in (lb) to have BMI = 25: 164.1  Physical Exam  Constitutional: She is oriented to person, place, and time. She appears well-developed and well-nourished. No distress.  HENT:  Head: Normocephalic and atraumatic.  Right Ear: External ear normal.  Left Ear: External ear normal.  Eyes: Pupils are equal, round, and reactive to light. Conjunctivae and EOM are normal.  Cardiovascular: Normal rate, regular rhythm and normal heart sounds.  Exam reveals no friction rub.   No murmur heard. Pulmonary/Chest: Effort normal. No respiratory distress.  Neurological: She is alert and oriented to person, place, and time.  Skin: She is not diaphoretic.  Psychiatric: She has a normal mood and affect. Her  behavior is normal.   Assessment and Plan: AMerna Baldiis a 31y.o. female who is here today for chest pain and sob. This has been chronic and described as the re-occuring chest pain for the last 2 months.   Discussed alarming sxs to warrant immediate ED visit.  Advised to take mobic once per day, for better coumadin control.  Placed as stat, as she was advised to come in, 1 week ago, but thought it meant the following week.     Chest pain, unspecified type - Plan: EKG 12-Lead, CMP14+EGFR, Protime-INR, POCT CBC, CANCELED: CBC  SOB (shortness of breath) - Plan: CMP14+EGFR, Protime-INR, POCT CBC, CANCELED: CBC  Dizziness - Plan: CMP14+EGFR, Protime-INR, POCT CBC, CANCELED: CBC  Anticoagulated on Coumadin - Plan: CMP14+EGFR, Protime-INR, POCT CBC, CANCELED: CBC  Other pulmonary embolism without acute cor pulmonale, unspecified chronicity (HCC)  Ivar Drape, PA-C [facility] 8/31/20189:04 AM   Addendum: 1.8 coumadin.  Advised to increase to 17.52m daily.  She will return for labs only pt/inr. She is to contact fmla/disability for extension from work.

## 2017-03-23 LAB — CMP14+EGFR
ALBUMIN: 4.2 g/dL (ref 3.5–5.5)
ALK PHOS: 111 IU/L (ref 39–117)
ALT: 10 IU/L (ref 0–32)
AST: 13 IU/L (ref 0–40)
Albumin/Globulin Ratio: 1.4 (ref 1.2–2.2)
BUN/Creatinine Ratio: 15 (ref 9–23)
BUN: 13 mg/dL (ref 6–20)
Bilirubin Total: <0.2 mg/dL (ref 0.0–1.2)
CHLORIDE: 102 mmol/L (ref 96–106)
CO2: 24 mmol/L (ref 20–29)
Calcium: 9.5 mg/dL (ref 8.7–10.2)
Creatinine, Ser: 0.85 mg/dL (ref 0.57–1.00)
GFR calc Af Amer: 107 mL/min/{1.73_m2} (ref >59)
GFR calc non Af Amer: 93 mL/min/{1.73_m2} (ref >59)
GLUCOSE: 89 mg/dL (ref 65–99)
Globulin, Total: 3.1 g/dL (ref 1.5–4.5)
Potassium: 4.4 mmol/L (ref 3.5–5.2)
Sodium: 141 mmol/L (ref 134–144)
Total Protein: 7.3 g/dL (ref 6.0–8.5)

## 2017-03-29 ENCOUNTER — Ambulatory Visit: Payer: 59 | Admitting: Urgent Care

## 2017-03-29 DIAGNOSIS — Z7901 Long term (current) use of anticoagulants: Secondary | ICD-10-CM | POA: Diagnosis not present

## 2017-03-29 DIAGNOSIS — Z5181 Encounter for therapeutic drug level monitoring: Secondary | ICD-10-CM | POA: Diagnosis not present

## 2017-03-29 DIAGNOSIS — Z882 Allergy status to sulfonamides status: Secondary | ICD-10-CM | POA: Diagnosis not present

## 2017-03-29 DIAGNOSIS — R399 Unspecified symptoms and signs involving the genitourinary system: Secondary | ICD-10-CM | POA: Diagnosis not present

## 2017-03-29 DIAGNOSIS — R319 Hematuria, unspecified: Secondary | ICD-10-CM | POA: Diagnosis not present

## 2017-03-29 LAB — PROTIME-INR
INR: 1.2 (ref 0.8–1.2)
Prothrombin Time: 13.2 s — ABNORMAL HIGH (ref 9.1–12.0)

## 2017-03-30 ENCOUNTER — Ambulatory Visit: Payer: 59 | Admitting: Physician Assistant

## 2017-04-02 ENCOUNTER — Ambulatory Visit: Payer: 59 | Admitting: Physician Assistant

## 2017-04-03 ENCOUNTER — Ambulatory Visit: Payer: 59 | Admitting: Physician Assistant

## 2017-04-04 ENCOUNTER — Emergency Department (HOSPITAL_COMMUNITY): Payer: 59

## 2017-04-04 ENCOUNTER — Ambulatory Visit (INDEPENDENT_AMBULATORY_CARE_PROVIDER_SITE_OTHER): Payer: 59 | Admitting: Emergency Medicine

## 2017-04-04 ENCOUNTER — Encounter (HOSPITAL_COMMUNITY): Payer: Self-pay

## 2017-04-04 ENCOUNTER — Telehealth: Payer: Self-pay | Admitting: Physician Assistant

## 2017-04-04 ENCOUNTER — Emergency Department (HOSPITAL_COMMUNITY)
Admission: EM | Admit: 2017-04-04 | Discharge: 2017-04-04 | Disposition: A | Discharge status: Home / Self Care | Payer: 59 | Attending: Emergency Medicine | Admitting: Emergency Medicine

## 2017-04-04 ENCOUNTER — Encounter: Payer: Self-pay | Admitting: Emergency Medicine

## 2017-04-04 VITALS — BP 124/84 | HR 86 | Temp 98.3°F | Resp 16 | Ht 68.75 in | Wt 337.0 lb

## 2017-04-04 DIAGNOSIS — R079 Chest pain, unspecified: Secondary | ICD-10-CM | POA: Diagnosis not present

## 2017-04-04 DIAGNOSIS — I2699 Other pulmonary embolism without acute cor pulmonale: Secondary | ICD-10-CM | POA: Diagnosis not present

## 2017-04-04 DIAGNOSIS — J45909 Unspecified asthma, uncomplicated: Secondary | ICD-10-CM | POA: Insufficient documentation

## 2017-04-04 DIAGNOSIS — Z7901 Long term (current) use of anticoagulants: Secondary | ICD-10-CM

## 2017-04-04 DIAGNOSIS — R4189 Other symptoms and signs involving cognitive functions and awareness: Secondary | ICD-10-CM | POA: Insufficient documentation

## 2017-04-04 DIAGNOSIS — R4789 Other speech disturbances: Secondary | ICD-10-CM | POA: Insufficient documentation

## 2017-04-04 DIAGNOSIS — Z86711 Personal history of pulmonary embolism: Secondary | ICD-10-CM | POA: Insufficient documentation

## 2017-04-04 DIAGNOSIS — R519 Headache, unspecified: Secondary | ICD-10-CM

## 2017-04-04 DIAGNOSIS — M329 Systemic lupus erythematosus, unspecified: Secondary | ICD-10-CM | POA: Diagnosis not present

## 2017-04-04 DIAGNOSIS — Z79899 Other long term (current) drug therapy: Secondary | ICD-10-CM | POA: Insufficient documentation

## 2017-04-04 DIAGNOSIS — R0789 Other chest pain: Secondary | ICD-10-CM | POA: Insufficient documentation

## 2017-04-04 DIAGNOSIS — R0602 Shortness of breath: Secondary | ICD-10-CM | POA: Diagnosis not present

## 2017-04-04 DIAGNOSIS — R51 Headache: Secondary | ICD-10-CM | POA: Insufficient documentation

## 2017-04-04 HISTORY — DX: Other speech disturbances: R47.89

## 2017-04-04 HISTORY — DX: Headache, unspecified: R51.9

## 2017-04-04 HISTORY — DX: Other symptoms and signs involving cognitive functions and awareness: R41.89

## 2017-04-04 HISTORY — DX: Chest pain, unspecified: R07.9

## 2017-04-04 LAB — CBC
HEMATOCRIT: 36.3 % (ref 36.0–46.0)
Hemoglobin: 11.7 g/dL — ABNORMAL LOW (ref 12.0–15.0)
MCH: 26.8 pg (ref 26.0–34.0)
MCHC: 32.2 g/dL (ref 30.0–36.0)
MCV: 83.1 fL (ref 78.0–100.0)
PLATELETS: 393 10*3/uL (ref 150–400)
RBC: 4.37 MIL/uL (ref 3.87–5.11)
RDW: 14.5 % (ref 11.5–15.5)
WBC: 5 10*3/uL (ref 4.0–10.5)

## 2017-04-04 LAB — BASIC METABOLIC PANEL
Anion gap: 8 (ref 5–15)
BUN: 12 mg/dL (ref 6–20)
CALCIUM: 9.3 mg/dL (ref 8.9–10.3)
CO2: 24 mmol/L (ref 22–32)
CREATININE: 0.7 mg/dL (ref 0.44–1.00)
Chloride: 104 mmol/L (ref 101–111)
GFR calc Af Amer: >60 mL/min (ref >60)
GLUCOSE: 99 mg/dL (ref 65–99)
POTASSIUM: 3.8 mmol/L (ref 3.5–5.1)
SODIUM: 136 mmol/L (ref 135–145)

## 2017-04-04 LAB — PROTIME-INR
INR: 1.69
PROTHROMBIN TIME: 19.8 s — AB (ref 11.4–15.2)

## 2017-04-04 LAB — I-STAT BETA HCG BLOOD, ED (MC, WL, AP ONLY): I-stat hCG, quantitative: <5 m[IU]/mL (ref <5)

## 2017-04-04 LAB — I-STAT TROPONIN, ED
TROPONIN I, POC: 0 ng/mL (ref 0.00–0.08)
Troponin i, poc: 0 ng/mL (ref 0.00–0.08)

## 2017-04-04 MED ORDER — WARFARIN - PHARMACIST DOSING INPATIENT: Freq: Every day | Status: DC

## 2017-04-04 MED ORDER — IOPAMIDOL (ISOVUE-370) INJECTION 76%
100.0000 mL | Freq: Once | INTRAVENOUS | Status: AC | PRN
  Administered 2017-04-04: 100 mL via INTRAVENOUS

## 2017-04-04 MED ORDER — IOPAMIDOL (ISOVUE-370) INJECTION 76%
INTRAVENOUS | Status: AC
  Filled 2017-04-04: qty 100

## 2017-04-04 MED ORDER — WARFARIN SODIUM 10 MG PO TABS
20.0000 mg | ORAL_TABLET | Freq: Once | ORAL | Status: AC
  Administered 2017-04-04: 20 mg via ORAL
  Filled 2017-04-04: qty 2

## 2017-04-04 MED ORDER — ONDANSETRON HCL 4 MG/2ML IJ SOLN
4.0000 mg | Freq: Once | INTRAMUSCULAR | Status: AC
  Administered 2017-04-04: 4 mg via INTRAVENOUS
  Filled 2017-04-04: qty 2

## 2017-04-04 MED ORDER — METHOCARBAMOL 500 MG PO TABS: 500.0000 mg | ORAL_TABLET | Freq: Two times a day (BID) | ORAL | 0 refills | Status: DC

## 2017-04-04 MED ORDER — MORPHINE SULFATE (PF) 4 MG/ML IV SOLN
4.0000 mg | Freq: Once | INTRAVENOUS | Status: AC
  Administered 2017-04-04: 4 mg via INTRAVENOUS
  Filled 2017-04-04: qty 1

## 2017-04-04 NOTE — Progress Notes (Signed)
ANTICOAGULATION CONSULT NOTE   Pharmacy Consult for warfarin Indication:hx recurrent  pulmonary embolus  Allergies  Allergen Reactions  . Bactrim Anaphylaxis and Hives  . Prednisone Anaphylaxis and Hives    Has been on Dexamethasone without issue.  . Nsaids Other (See Comments)    Has Lupus, reccommended to avoid NSAIDs  . Other Itching    Shrimp:throat itching "can eat other shellfish"  . Pineapple Itching  . Procardia [Nifedipine] Swelling    Swelling of tongue  . Vicodin [Hydrocodone-Acetaminophen] Hives and Swelling    Can take Percocet without difficulty    Patient Measurements: Height: 5' 8.75" (174.6 cm) Weight: (!) 337 lb (152.9 kg) IBW/kg (Calculated) : 65.63 Heparin Dosing Weight:   Vital Signs: Temp: 97.9 F (36.6 C) (09/12 1216) Temp Source: Oral (09/12 1216) BP: 142/100 (09/12 1216) Pulse Rate: 80 (09/12 1216)  Labs:  Recent Labs  04/04/17 1227 04/04/17 1322  HGB 11.7*  --   HCT 36.3  --   PLT 393  --   LABPROT  --  19.8*  INR  --  1.69  CREATININE 0.70  --     Estimated Creatinine Clearance: 164.6 mL/min (by C-G formula based on SCr of 0.7 mg/dL).   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  Chest CT on 04/04/17 negative for PE. To resume warfarin while patient is hospitalized.  Today, 04/04/2017: - INR is subtherapeutic at 1.69 - cbc ok - no bleeding documented   Goal of Therapy:  INR 2-3 Monitor platelets by anticoagulation protocol: Yes   Plan:  - warfarin 20 mg PO x1 today (using higher dose d/t low INR) - daily INR - monitor for s/s bleeding  Dorna Leitzham, Tasharra Nodine P 04/04/2017,3:02 PM

## 2017-04-04 NOTE — ED Triage Notes (Signed)
Patient was sent by PCP office due to c/o left chest pain and SOB. Patient has a history of PE's x 3. Patient reports SOB and chest pain 6 days ago. Paatient feels like she is slurring her words.

## 2017-04-04 NOTE — Progress Notes (Signed)
Hayley Webb 31 y.o.   Chief Complaint  Patient presents with  . Chest Pain    per patient - different this time with pressure feeling x 6 days    HISTORY OF PRESENT ILLNESS: This is a 31 y.o. female with h/o SLE and recurrent PE's, on Coumadin (INR 9/6 1.3) complaining of left sided chest pressure x 6 days; intermittent and feels "like a gurgling sensation". Has some DOE but denies syncope, n/v, diaphoresis; also c/o sharp pain to left anterior part of her head x 2 days with difficulty getting words out; "words don't come out right"; describes pain as "hammering into right side of my head".  HPI   Prior to Admission medications   Medication Sig Start Date End Date Taking? Authorizing Provider  acetaminophen (TYLENOL) 500 MG tablet Take 1,000 mg by mouth every 6 (six) hours as needed for headache (pain).    Yes [provider]  albuterol (PROVENTIL HFA;VENTOLIN HFA) 108 (90 Base) MCG/ACT inhaler Inhale 2 puffs into the lungs every 6 (six) hours as needed for wheezing or shortness of breath.   Yes [provider]  albuterol (PROVENTIL) (2.5 MG/3ML) 0.083% nebulizer solution Take 2.5 mg by nebulization every 6 (six) hours as needed for wheezing or shortness of breath.   Yes [provider]  hydroxychloroquine (PLAQUENIL) 200 MG tablet Take 200 mg by mouth daily.   Yes [provider]  meloxicam (MOBIC) 7.5 MG tablet Take 1 tablet (7.5 mg total) by mouth 2 (two) times daily. 02/19/17  Yes Calvert Cantorizwan, Saima, MD  methylPREDNISolone (MEDROL) 4 MG tablet Take 4 mg by mouth daily.   Yes [provider]  oxyCODONE-acetaminophen (PERCOCET/ROXICET) 5-325 MG tablet Take 1 tablet by mouth every 6 (six) hours as needed for moderate pain. 01/02/17  Yes Regalado, Belkys A, MD  senna (SENOKOT) 8.6 MG TABS tablet Take 1 tablet (8.6 mg total) by mouth 2 (two) times daily. 01/02/17  Yes Regalado, Belkys A, MD  traMADol (ULTRAM) 50 MG tablet Take 1-2 tablets (50-100 mg  total) by mouth every 6 (six) hours as needed for severe pain. 02/19/17  Yes Calvert Cantorizwan, Saima, MD  warfarin (COUMADIN) 5 MG tablet Take 15mg  every other day, and 17.5 at the interval 03/14/17  Yes English, Stephanie D, PA  Multiple Vitamin (MULTIVITAMIN WITH MINERALS) TABS tablet Take 1 tablet by mouth daily.    [provider]    Allergies  Allergen Reactions  . Bactrim Anaphylaxis and Hives  . Prednisone Anaphylaxis and Hives    Has been on Dexamethasone without issue.  . Nsaids Other (See Comments)    Has Lupus, reccommended to avoid NSAIDs  . Other Itching    Shrimp:throat itching "can eat other shellfish"  . Pineapple Itching  . Procardia [Nifedipine] Swelling    Swelling of tongue  . Vicodin [Hydrocodone-Acetaminophen] Hives and Swelling    Can take Percocet without difficulty    Patient Active Problem List   Diagnosis Date Noted  . Antiphospholipid antibody syndrome (HCC) 02/10/2017  . Pulmonary embolus (HCC) 02/09/2017  . Pulmonary embolism (HCC) 12/26/2016  . Shortness of breath   . Vaginal delivery--VE assist 10/25/2013  . 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 Obesity-BMI 47 05/09/2013  . Hirsutism 10/02/2012  . STD (female) 01/18/2012  . Lupus   . Asthma   . HSV infection     Past Medical History:  Diagnosis Date  . Anxiety    doing good now  .  Asthma   . Headache(784.0)   . HSV infection   . Infection    UTI  . Lupus    dx age 62  . Pulmonary embolism (HCC) 06/4/82011  . 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 MOUTH SURGERY     2 teeth removed- 1 wisdom and 1 in front of it  . TONSILLECTOMY AND ADENOIDECTOMY  1998  . WISDOM TOOTH EXTRACTION      Social History   Social History  . Marital status: Single    Spouse name: N/A  . Number of children: N/A  . Years of education: N/A   Occupational  History  . Not on file.   Social History Main Topics  . Smoking status: Never Smoker  . Smokeless tobacco: Never Used  . Alcohol use No  . Drug use: No  . Sexual activity: No   Other Topics Concern  . Not on file   Social History Narrative  . No narrative on file    Family History  Problem Relation Age of Onset  . Diabetes Maternal Aunt   . Cancer Maternal Grandmother 72       COLON CA  . Diabetes Maternal Grandmother   . Hypertension Maternal Grandfather   . Cancer Maternal Grandfather        prostate  . Asthma Mother   . Asthma Brother   . Cancer Paternal Grandmother        breast  . Lupus Paternal Grandmother   . Anesthesia problems Neg Hx   . Hypotension Neg Hx   . Malignant hyperthermia Neg Hx   . Pseudochol deficiency Neg Hx      Review of Systems  Constitutional: Negative.  Negative for chills and fever.  HENT: Negative.  Negative for congestion, hearing loss, nosebleeds and sore throat.   Eyes: Negative.  Negative for blurred vision, double vision and pain.  Respiratory: Positive for shortness of breath (on exertion). Negative for cough and hemoptysis.   Cardiovascular: Positive for chest pain. Negative for palpitations, claudication and leg swelling.  Gastrointestinal: Negative for abdominal pain, diarrhea, nausea and vomiting.  Genitourinary: Negative for dysuria and hematuria.  Musculoskeletal: Negative for back pain, myalgias and neck pain.  Skin: Negative.  Negative for rash.  Neurological: Positive for speech change and headaches. Negative for dizziness, sensory change, focal weakness, seizures and loss of consciousness.  Endo/Heme/Allergies: Negative.   All other systems reviewed and are negative.    Vitals:   04/04/17 0813  BP: 124/84  Pulse: 86  Resp: 16  Temp: 98.3 F (36.8 C)  SpO2: 97%     Physical Exam  Constitutional: She is oriented to person, place, and time. She appears well-developed and well-nourished.  HENT:  Head:  Normocephalic and atraumatic.  Right Ear: External ear normal.  Left Ear: External ear normal.  Nose: Nose normal.  Mouth/Throat: Oropharynx is clear and moist.  Eyes: Pupils are equal, round, and reactive to light. Conjunctivae and EOM are normal.  Neck: Normal range of motion. Neck supple. No JVD present. No thyromegaly present.  Cardiovascular: Normal rate, regular rhythm and normal heart sounds.   Pulmonary/Chest: Effort normal and breath sounds normal.  Abdominal: Soft. Bowel sounds are normal. She exhibits no distension. There is no tenderness.  Musculoskeletal: Normal range of motion.  Lymphadenopathy:    She has no cervical adenopathy.  Neurological: She is alert and oriented to person, place, and time.  She displays normal reflexes. No cranial nerve deficit or sensory deficit. She exhibits normal muscle tone. Coordination normal.  Skin: Skin is warm and dry. Capillary refill takes less than 2 seconds. No rash noted.  Psychiatric: She has a normal mood and affect. Her behavior is normal.  Vitals reviewed.    ASSESSMENT & PLAN: Needs further assessment and diagnostic imaging to r/o embolic events to both lungs and brain. Referred to ER [facility]).  Hayley Webb was seen today for chest pain.  Diagnoses and all orders for this visit:  Chest pain, unspecified type -     EKG 12-Lead  On continuous oral anticoagulation Comments: subtherapeutic levels.  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HCC)  Systemic lupus erythematosus, unspecified SLE type, unspecified organ involvement status (HCC)  Nonintractable headache, unspecified chronicity pattern, unspecified headache type  Altered thought processes Comments: r/o embolic event  Word finding difficulty   Patient Instructions    TO ER now (prefers [facility]).   IF you received an x-ray today, you will receive an invoice from [facility]. Please contact [facility] at [phone] with questions or  concerns regarding your invoice.   IF you received labwork today, you will receive an invoice from [place]. Please contact [facility] at [phone] with questions or concerns regarding your invoice.   Our billing staff will not be able to assist you with questions regarding bills from these companies.  You will be contacted with the lab results as soon as they are available. The fastest way to get your results is to activate your My Chart account. Instructions are located on the last page of this paperwork. If you have not heard from [place] regarding the results in 2 weeks, please contact this office.        Edwina Barth, MD [facility]

## 2017-04-04 NOTE — ED Provider Notes (Signed)
[facility]-EMERGENCY DEPT Provider Note   CSN: [id number] Arrival date & time: 04/04/17  1126     History   Chief Complaint Chief Complaint  Patient presents with  . Chest Pain  . Shortness of Breath    HPI Hayley Webb is a 31 y.o. female.  HPI   Hayley Webb is a 31 y.o. female, with a history of multiple PEs and lupus, presenting to the ED with chest pain beginning on Sept 6.  Pain "feels like pressure and like something is moving in there," left chest, nonradiating, was intermittent with exertion, but became constant three days ago, currently 5/10, 7/10 with exertion. Nausea and shortness of breath with exertion.   Also endorses an intermittent headache beginning three days ago (9/9), right frontal, intermittent, nonradiating, 9/10 when present. Last about 5-10 minutes. Endorses 4-5 occurrences a day. Endorses multiple episodes of feeling like she was slurring her speech and saying words she did not intend to say. For example, she wanted to say "drive" and ended up saying "bread." She was aware of this switch. This began three days ago (9/9) and has happened 4-5 times a day since then, including today. Accompanied by the headache. States she can correct it by slowing down her speech. She adds that she has been under an unusual amount of stress lately and has been more anxious.   Denies cough, fever/chills, vomiting/diarrhea, numbness, weakness, confusion, leg swelling or pain, dizziness, vision changes, facial droop, or any other complaints.  States she has been compliant with her coumadin. Her coumadin dose was decreased to 15mg  daily about two weeks ago.  Patient noted to have a subtherapeutic INR of 1.2 on September 6.   Past Medical History:  Diagnosis Date  . Anxiety    doing good now  . Asthma   . Headache(784.0)   . HSV infection   . Infection    UTI  . Lupus    dx age 31  . Pulmonary embolism (HCC) 06/4/82011  . 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 Chest pain 04/04/2017  . On continuous oral anticoagulation 04/04/2017  . 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 Nonintractable headache 04/04/2017  . Altered thought processes 04/04/2017  . Word finding difficulty 04/04/2017  . Antiphospholipid antibody syndrome (HCC) 02/10/2017  . Pulmonary embolus (HCC) 02/09/2017  . Pulmonary embolism (HCC) 12/26/2016  . Shortness of breath   . Vaginal delivery--VE assist 10/25/2013  . 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 Obesity-BMI 47 05/09/2013  . Hirsutism 10/02/2012  . STD (female) 01/18/2012  . Lupus   . Asthma   . HSV infection     Past Surgical History:  Procedure Laterality Date  . MOUTH SURGERY     2 teeth removed- 1 wisdom and 1 in front of it  . TONSILLECTOMY AND ADENOIDECTOMY  1998  . WISDOM TOOTH EXTRACTION      OB History    Gravida Para Term Preterm AB Living   2 1 1   1 1    SAB TAB Ectopic Multiple Live Births   1       1       Home Medications    Prior to Admission medications   Medication Sig Start Date End Date Taking? Authorizing Provider  acetaminophen (TYLENOL) 500 MG tablet Take 1,000 mg by mouth every 6 (six) hours as needed  for headache (pain).    Yes [provider]  albuterol (PROVENTIL HFA;VENTOLIN HFA) 108 (90 Base) MCG/ACT inhaler Inhale 2 puffs into the lungs every 6 (six) hours as needed for wheezing or shortness of breath.   Yes [provider]  albuterol (PROVENTIL) (2.5 MG/3ML) 0.083% nebulizer solution Take 2.5 mg by nebulization every 6 (six) hours as needed for wheezing or shortness of breath.   Yes [provider]  hydroxychloroquine (PLAQUENIL) 200 MG tablet Take 200 mg by mouth daily.   Yes [provider]  meloxicam (MOBIC) 7.5 MG tablet Take 1 tablet (7.5 mg total) by mouth 2 (two) times daily. Patient  taking differently: Take 7.5 mg by mouth daily.  02/19/17  Yes Calvert Cantor, MD  methylPREDNISolone (MEDROL) 4 MG tablet Take 4 mg by mouth daily.   Yes [provider]  oxyCODONE-acetaminophen (PERCOCET/ROXICET) 5-325 MG tablet Take 1 tablet by mouth every 6 (six) hours as needed for moderate pain. 01/02/17  Yes Regalado, Belkys A, MD  senna (SENOKOT) 8.6 MG TABS tablet Take 1 tablet (8.6 mg total) by mouth 2 (two) times daily. 01/02/17  Yes Regalado, Belkys A, MD  traMADol (ULTRAM) 50 MG tablet Take 1-2 tablets (50-100 mg total) by mouth every 6 (six) hours as needed for severe pain. 02/19/17  Yes Calvert Cantor, MD  warfarin (COUMADIN) 5 MG tablet Take  every other day, and 17.5 at the interval Patient taking differently: Take 15 mg by mouth daily.  03/14/17  Yes English, Judeth Cornfield D, PA  methocarbamol (ROBAXIN) 500 MG tablet Take 1 tablet (500 mg total) by mouth 2 (two) times daily. 04/04/17   Angeligue Bowne, Hillard Danker, PA-C    Family History Family History  Problem Relation Age of Onset  . Diabetes Maternal Aunt   . Cancer Maternal Grandmother 72       COLON CA  . Diabetes Maternal Grandmother   . Hypertension Maternal Grandfather   . Cancer Maternal Grandfather        prostate  . Asthma Mother   . Asthma Brother   . Cancer Paternal Grandmother        breast  . Lupus Paternal Grandmother   . Anesthesia problems Neg Hx   . Hypotension Neg Hx   . Malignant hyperthermia Neg Hx   . Pseudochol deficiency Neg Hx     Social History Social History  Substance Use Topics  . Smoking status: Never Smoker  . Smokeless tobacco: Never Used  . Alcohol use No     Allergies   Bactrim; Prednisone; Nsaids; Other; Pineapple; Procardia [nifedipine]; and Vicodin [hydrocodone-acetaminophen]   Review of Systems Review of Systems  Constitutional: Negative for chills, diaphoresis and fever.  Respiratory: Positive for shortness of breath.   Cardiovascular: Positive for chest pain.    Gastrointestinal: Positive for nausea. Negative for abdominal pain and vomiting.  Musculoskeletal: Negative for back pain and neck pain.  Neurological: Positive for headaches (intermittent). Negative for dizziness, syncope, facial asymmetry, weakness, light-headedness and numbness.  All other systems reviewed and are negative.    Physical Exam Updated Vital Signs BP (!) 142/100 (BP Location: Right Arm)   Pulse 80   Temp 97.9 F (36.6 C) (Oral)   Resp 16   Ht 5' 8.75" (1.746 m)   Wt (!) 152.9 kg (337 lb)   LMP 03/05/2017   SpO2 99%   BMI 50.13 kg/m   Physical Exam  Constitutional: She appears well-developed and well-nourished. No distress.  HENT:  Head: Normocephalic and atraumatic.  Eyes: Pupils are equal, round, and reactive to light. Conjunctivae and EOM are normal.  Neck: Normal range of motion. Neck supple.  Cardiovascular: Normal rate, regular rhythm, normal heart sounds and intact distal pulses.   Pulmonary/Chest: Effort normal and breath sounds normal. No respiratory distress.  Patient speaks in full sentences without noted difficulty. No increased work of breathing.  Abdominal: Soft. There is no tenderness. There is no guarding.  Musculoskeletal: She exhibits no edema or tenderness.  Normal motor function intact in all extremities and spine. No midline spinal tenderness.   No tenderness, erythema, swelling, or increased warmth to the bilateral lower extremities.  Lymphadenopathy:    She has no cervical adenopathy.  Neurological: She is alert.  No sensory deficits.  No noted speech deficits. No aphasia. Patient handles oral secretions without difficulty. No noted swallowing defects.  Equal grip strength bilaterally. Strength 5/5 in the upper extremities. Strength 5/5 with flexion and extension of the hips, knees, and ankles bilaterally.  Patellar DTRs 2+ bilaterally Negative Romberg. No gait disturbance.  Coordination intact including heel to shin and finger to  nose.  Cranial nerves III-XII grossly intact.  No facial droop.   Skin: Skin is warm and dry. She is not diaphoretic.  Psychiatric: She has a normal mood and affect. Her behavior is normal.  Nursing note and vitals reviewed.    ED Treatments / Results  Labs (all labs ordered are listed, but only abnormal results are displayed) Labs Reviewed  CBC - Abnormal; Notable for the following:       Result Value   Hemoglobin 11.7 (*)    All other components within normal limits  PROTIME-INR - Abnormal; Notable for the following:    Prothrombin Time 19.8 (*)    All other components within normal limits  BASIC METABOLIC PANEL  PROTIME-INR  I-STAT TROPONIN, ED  I-STAT BETA HCG BLOOD, ED ([facility], [facility], [facility] ONLY)  I-STAT TROPONIN, ED    EKG  EKG Interpretation  Date/Time:  Wednesday April 04 2017 12:17:31 EDT Ventricular Rate:  82 PR Interval:    QRS Duration: 92 QT Interval:  387 QTC Calculation: 452 R Axis:   14 Text Interpretation:  Sinus rhythm No significant change since last tracing Confirmed by Frederick Peers [phone]) on 04/04/2017 1:10:45 PM       Radiology Dg Chest 2 View  Result Date: 04/04/2017 CLINICAL DATA:  Left chest pain, shortness of Breath EXAM: CHEST  2 VIEW COMPARISON:  02/26/2017 FINDINGS: Heart and mediastinal contours are within normal limits. No focal opacities or effusions. No acute bony abnormality. IMPRESSION: No active cardiopulmonary disease. Electronically Signed   By: Charlett Nose M.D.   On: 04/04/2017 14:00   Ct Angio Chest Pe W And/or Wo Contrast  Result Date: 04/04/2017 CLINICAL DATA:  Chest pain and shortness of breath since 03/29/2017. EXAM: CT ANGIOGRAPHY CHEST WITH CONTRAST TECHNIQUE: Multidetector CT imaging of the chest was performed using the standard protocol during bolus administration of intravenous contrast. Multiplanar CT image reconstructions and MIPs were obtained to evaluate the vascular anatomy. CONTRAST:  100 cc Isovue 370 COMPARISON:   CT scan 02/18/2017 FINDINGS: Cardiovascular: The heart is upper limits of normal in size for age. No pericardial effusion. The aorta is normal in caliber. No dissection. The pulmonary arterial tree is fairly well opacified. No definite filling defects to suggest pulmonary embolism. Mediastinum/Nodes: No mediastinal or hilar mass or adenopathy. The esophagus is unremarkable. Lungs/Pleura: Mild dependent bibasilar atelectasis/ edema but no infiltrates or effusions. No worrisome  pulmonary lesions. Upper Abdomen: No significant upper abdominal findings. Musculoskeletal: No breast masses, supraclavicular or axillary adenopathy. The thyroid gland appears normal. No significant bony findings. Review of the MIP images confirms the above findings. IMPRESSION: 1. No CT findings for pulmonary embolism. 2. Normal thoracic aorta. 3. Stable borderline cardiac enlargement. 4. No significant pulmonary findings. Electronically Signed   By: Rudie Meyer M.D.   On: 04/04/2017 14:29   Mr Angiogram Head Wo Contrast  Result Date: 04/04/2017 CLINICAL DATA:  Chest pain and shortness of breath. Headache and slurred speech. Possible pulmonary embolism. EXAM: MRI HEAD WITHOUT CONTRAST MRA HEAD WITHOUT CONTRAST TECHNIQUE: Multiplanar, multiecho pulse sequences of the brain and surrounding structures were obtained without intravenous contrast. Angiographic images of the head were obtained using MRA technique without contrast. COMPARISON:  None available for comparison at time of study interpretation. FINDINGS: MRI HEAD FINDINGS BRAIN: No reduced diffusion to suggest acute ischemia or hyperacute demyelination. No susceptibility artifact to suggest hemorrhage. The ventricles and sulci are normal for patient's age. No suspicious parenchymal signal, mass or mass effect. No abnormal extra-axial fluid collections. VASCULAR: Normal major intracranial vascular flow voids present at skull base. SKULL AND UPPER CERVICAL SPINE: No abnormal sellar  expansion. No suspicious calvarial bone marrow signal. Craniocervical junction maintained. SINUSES/ORBITS: Small LEFT maxillary mucosal retention cyst. Mastoid air cells are well aerated. The included ocular globes and orbital contents are non-suspicious. OTHER: None. MRA HEAD FINDINGS- Mild motion degraded examination. ANTERIOR CIRCULATION: Normal flow related enhancement of the included cervical, petrous, cavernous and supraclinoid internal carotid arteries. Patent anterior communicating artery. Patent anterior and middle cerebral arteries, including distal segments. No large vessel occlusion, high-grade stenosis, aneurysm. POSTERIOR CIRCULATION: RIGHT vertebral artery is dominant. Attenuated flow related enhancement LEFT vertebral artery. Basilar artery is patent, with normal flow related enhancement of the main branch vessels. Patent posterior cerebral arteries. No large vessel occlusion, high-grade stenosis,  aneurysm. ANATOMIC VARIANTS: Supernumerary anterior cerebral artery arising from LEFT A1-2 junction. Source images and MIP images were reviewed. IMPRESSION: MRI HEAD: 1. No acute intracranial process. Normal noncontrast MRI of the head. MRA HEAD: 1. Mild motion degraded examination. No emergent large vessel occlusion or intracranial significant stenosis. 2. Attenuated LEFT vertebral artery seen with tortuosity or proximal flow limiting stenosis. Electronically Signed   By: Awilda Metro M.D.   On: 04/04/2017 18:45   Mr Brain Wo Contrast  Result Date: 04/04/2017 CLINICAL DATA:  Chest pain and shortness of breath. Headache and slurred speech. Possible pulmonary embolism. EXAM: MRI HEAD WITHOUT CONTRAST MRA HEAD WITHOUT CONTRAST TECHNIQUE: Multiplanar, multiecho pulse sequences of the brain and surrounding structures were obtained without intravenous contrast. Angiographic images of the head were obtained using MRA technique without contrast. COMPARISON:  None available for comparison at time of study  interpretation. FINDINGS: MRI HEAD FINDINGS BRAIN: No reduced diffusion to suggest acute ischemia or hyperacute demyelination. No susceptibility artifact to suggest hemorrhage. The ventricles and sulci are normal for patient's age. No suspicious parenchymal signal, mass or mass effect. No abnormal extra-axial fluid collections. VASCULAR: Normal major intracranial vascular flow voids present at skull base. SKULL AND UPPER CERVICAL SPINE: No abnormal sellar expansion. No suspicious calvarial bone marrow signal. Craniocervical junction maintained. SINUSES/ORBITS: Small LEFT maxillary mucosal retention cyst. Mastoid air cells are well aerated. The included ocular globes and orbital contents are non-suspicious. OTHER: None. MRA HEAD FINDINGS- Mild motion degraded examination. ANTERIOR CIRCULATION: Normal flow related enhancement of the included cervical, petrous, cavernous and supraclinoid internal carotid arteries. Patent anterior communicating  artery. Patent anterior and middle cerebral arteries, including distal segments. No large vessel occlusion, high-grade stenosis, aneurysm. POSTERIOR CIRCULATION: RIGHT vertebral artery is dominant. Attenuated flow related enhancement LEFT vertebral artery. Basilar artery is patent, with normal flow related enhancement of the main branch vessels. Patent posterior cerebral arteries. No large vessel occlusion, high-grade stenosis,  aneurysm. ANATOMIC VARIANTS: Supernumerary anterior cerebral artery arising from LEFT A1-2 junction. Source images and MIP images were reviewed. IMPRESSION: MRI HEAD: 1. No acute intracranial process. Normal noncontrast MRI of the head. MRA HEAD: 1. Mild motion degraded examination. No emergent large vessel occlusion or intracranial significant stenosis. 2. Attenuated LEFT vertebral artery seen with tortuosity or proximal flow limiting stenosis. Electronically Signed   By: Awilda Metro M.D.   On: 04/04/2017 18:45    Procedures Procedures  (including critical care time)  Medications Ordered in ED Medications  Warfarin - Pharmacist Dosing Inpatient (not administered)  iopamidol (ISOVUE-370) 76 % injection 100 mL (100 mLs Intravenous Contrast Given 04/04/17 1406)  ondansetron (ZOFRAN) injection 4 mg (4 mg Intravenous Given 04/04/17 1534)  morphine 4 MG/ML injection 4 mg (4 mg Intravenous Given 04/04/17 1534)  warfarin (COUMADIN) tablet 20 mg (20 mg Oral Given 04/04/17 1920)     Initial Impression / Assessment and Plan / ED Course  I have reviewed the triage vital signs and the nursing notes.  Pertinent labs & imaging results that were available during my care of the patient were reviewed by me and considered in my medical decision making (see chart for details).  Clinical Course as of Apr 04 2140  Wed Apr 04, 2017  1500 Rates pain 5/10  [SJ]  1515 Spoke with Dr. Otelia Limes, neurologist. States patient's reported presentation is highly atypical for stroke, TIA, or migraine, but concern for vascular abnormalities due to her lupus. Recommends MR brain with MRA head. If normal, appropriate for outpatient follow up. Would like to be updated with results.   [SJ]  1901 Spoke with Dr. Otelia Limes again to discuss MR/MRA results. He states the "stenosis" noted on the read is likely normal and congenital. The imaging study is essentially unremarkable. Recommends a muscle relaxer that may be related to stress or tension. She can follow up with neurology outpatient if she continues to have headaches.   [SJ]    Clinical Course User Index [SJ] Anyeli Hockenbury C, PA-C    Patient presents with complaint of chest pain as well as intermittent headache. Patient is nontoxic appearing, afebrile, not tachycardic, not tachypneic, not hypotensive, maintains SPO2 of 98-99% on room air, and is in no apparent distress.  Low suspicion for ACS. HEART score is 2, indicating low risk for a cardiac event. With patient's multiple previous PEs and subtherapeutic INR, she is  high risk for PE. CT PE free from evidence of PE. Delta troponins negative. Patient's pain resolved with one dose of IV analgesic.  Patient's INR found to be subtherapeutic. This was addressed. Patient to have INR reevaluated tomorrow by her PCP and any future warfarin changes made by her PCP. MRI/MRA for evaluation of the patient's headache shows no acute abnormality. Patient had no incidence of her reported speech abnormality or her headache here in the ED. Maintained normal neuro exam throughout ED course. Outpatient neurology follow-up. The patient was given instructions for home care as well as return precautions. Patient voices understanding of these instructions, accepts the plan, and is comfortable with discharge.     Findings and plan of care discussed with Frederick Peers, MD.  Vitals:   04/04/17 1216 04/04/17 1537 04/04/17 1540 04/04/17 1933  BP: (!) 142/100 (!) 141/110  (!) 145/99  Pulse: 80 81  80  Resp: Temp: 97.9 F (36.6 C)   98 F (36.7 C)  TempSrc: Oral   Oral  SpO2: 99% 99%  98%  Weight:      Height:           Final Clinical Impressions(s) / ED Diagnoses   Final diagnoses:  Chest pain, unspecified type  Nonintractable episodic headache, unspecified headache type    New Prescriptions Discharge Medication List as of 04/04/2017  7:12 PM    START taking these medications   Details  methocarbamol (ROBAXIN) 500 MG tablet Take 1 tablet (500 mg total) by mouth 2 (two) times daily., Starting Wed 04/04/2017, Print         Jhonatan Lomeli, [place] C, PA-C 04/04/17 2142    Little, Ambrose Finland, MD 04/04/17 2207

## 2017-04-04 NOTE — Telephone Encounter (Signed)
Attempted to reach patient to return to the clinic for PT/ INR. Contact number vm full. Will try calling again

## 2017-04-04 NOTE — Telephone Encounter (Signed)
Patient was seen in the clinic today.  Patient was sent to ED for possible clot.   Inquired of the dosing, as she was alerted to increase to 17.5mg .  She reports that she has been taking  despite lengthy discussion to increase to 17 and how to cut pill.  Will await ED visit notes. Hopefully, she will have this rechecked today. She had dates to return, but apparently was not able to be see.

## 2017-04-04 NOTE — Telephone Encounter (Signed)
Contacted jill to smith, contact patient to rtc for ptinr

## 2017-04-04 NOTE — Patient Instructions (Addendum)
  TO ER now ([facility]ers [facility]).   IF you received an x-ray today, you will receive an invoice from [facility]. Please contact [facility] at [phone] with questions or concerns regarding your invoice.   IF you received labwork today, you will receive an invoice from [facility]. Please contact [facility] at [phone] with questions or concerns regarding your invoice.   Our billing staff will not be able to assist you with questions regarding bills from these companies.  You will be contacted with the lab results as soon as they are available. The fastest way to get your results is to activate your My Chart account. Instructions are located on the last page of this paperwork. If you have not heard from us regarding the results in 2 weeks, please contact this office.

## 2017-04-04 NOTE — Telephone Encounter (Signed)
Patient needs disability forms completed for her latest OV's I did not fill them out because I was not sure how to. I will place the forms in Stephanie's box on 04/05/17 please return to the FMLA/Disability box at the [employer] checkout desk within 5-7 business days. Thank you!

## 2017-04-04 NOTE — Discharge Instructions (Signed)
There were no acute or concerning abnormalities on the lab work or imaging studies. Please stay well hydrated by drinking plenty of water. Reduce stress as much as possible. Get plenty of sleep at night. Please follow-up with your primary care provider for any further workup on the chest pain. Please also follow up with your primary care provider for management of your Coumadin and INR. Please have your INR rechecked tomorrow. Robaxin as a muscle relaxer and may help reduce tension that could be causing the headaches. Do not drive or perform other dangerous activities while taking the Robaxin. Follow-up with neurology in the office for any further evaluation of your headaches.

## 2017-04-10 ENCOUNTER — Ambulatory Visit: Payer: 59 | Admitting: Physician Assistant

## 2017-04-10 DIAGNOSIS — Z7901 Long term (current) use of anticoagulants: Secondary | ICD-10-CM | POA: Diagnosis not present

## 2017-04-10 DIAGNOSIS — Z5181 Encounter for therapeutic drug level monitoring: Secondary | ICD-10-CM | POA: Diagnosis not present

## 2017-04-10 LAB — PROTIME-INR
INR: 2.3 — ABNORMAL HIGH (ref 0.8–1.2)
PROTHROMBIN TIME: 22.9 s — AB (ref 9.1–12.0)

## 2017-04-11 NOTE — Telephone Encounter (Signed)
Completed.   I have left the stuff that generally gets filled out by [employer].  I do not want them to not accept it because I make odd changes.  Left in [employer] boxes.

## 2017-04-12 NOTE — Telephone Encounter (Signed)
Paperwork scanned and faxed on 04/12/17

## 2017-04-16 ENCOUNTER — Ambulatory Visit: Payer: 59 | Admitting: Physician Assistant

## 2017-04-17 ENCOUNTER — Other Ambulatory Visit: Payer: 59

## 2017-04-19 ENCOUNTER — Ambulatory Visit: Payer: 59 | Admitting: Family Medicine

## 2017-04-19 ENCOUNTER — Other Ambulatory Visit: Payer: Self-pay | Admitting: Urgent Care

## 2017-04-19 DIAGNOSIS — I2699 Other pulmonary embolism without acute cor pulmonale: Secondary | ICD-10-CM

## 2017-04-19 NOTE — Progress Notes (Unsigned)
Please call patient and have her come in for labs only.

## 2017-04-20 ENCOUNTER — Ambulatory Visit: Payer: 59 | Admitting: Urgent Care

## 2017-04-20 NOTE — Progress Notes (Signed)
Completed.

## 2017-04-23 ENCOUNTER — Ambulatory Visit: Payer: 59 | Admitting: Family Medicine

## 2017-04-23 NOTE — Telephone Encounter (Signed)
She is to come in for pt inr only.  Please have her called immediately.  This is a weekly order.  A labs only.  This is not elective.  She is on coumadin. She was contacted by Rodi, 04/19/2017--verbally reported by Clydia Llano.  She stated that she would come in, but did not.  If she is seeking other care, she can advise of this.  Otherwise she should come in.  She will then return for a check up with me 04/30/2017

## 2017-04-24 ENCOUNTER — Ambulatory Visit: Payer: 59 | Admitting: Hematology and Oncology

## 2017-04-24 ENCOUNTER — Telehealth: Payer: Self-pay | Admitting: *Deleted

## 2017-04-24 NOTE — Telephone Encounter (Signed)
Spoke with patient she states she is going to come ing today 10/2 to have her labs drawn.   Stressed the importance of her having this done.    Patient voiced understanding

## 2017-04-24 NOTE — Assessment & Plan Note (Deleted)
Pulmonary embolism diagnosed 12/30/2016 when she presented with chest pain and leg pains: Right lower lobe pulmonary artery   Current treatment: Anticoagulation for life because of antiphospholipid antibody syndrome. Currently on Coumadin 15 mg daily (previously was on Eliquis)  Labs to be drawn to recheck the presence of persistent lupus anticoagulant

## 2017-04-27 ENCOUNTER — Ambulatory Visit: Payer: 59

## 2017-04-27 ENCOUNTER — Other Ambulatory Visit: Payer: Self-pay

## 2017-04-27 ENCOUNTER — Ambulatory Visit (HOSPITAL_BASED_OUTPATIENT_CLINIC_OR_DEPARTMENT_OTHER): Payer: 59

## 2017-04-27 ENCOUNTER — Telehealth: Payer: Self-pay | Admitting: Hematology and Oncology

## 2017-04-27 ENCOUNTER — Ambulatory Visit (HOSPITAL_BASED_OUTPATIENT_CLINIC_OR_DEPARTMENT_OTHER): Payer: 59 | Admitting: Hematology and Oncology

## 2017-04-27 DIAGNOSIS — I2699 Other pulmonary embolism without acute cor pulmonale: Secondary | ICD-10-CM

## 2017-04-27 MED ORDER — APIXABAN 5 MG PO TABS: 5.0000 mg | ORAL_TABLET | Freq: Two times a day (BID) | ORAL | 11 refills | Status: DC

## 2017-04-27 NOTE — Telephone Encounter (Signed)
Scheduled appts per 10/5 los. Patient did not want avs or calendar.  °

## 2017-04-27 NOTE — Assessment & Plan Note (Signed)
Pulmonary embolism diagnosed 12/30/2016 when she presented with chest pain and leg pains: Right lower lobe pulmonary artery   Current treatment: Anticoagulation for life because of antiphospholipid antibody syndrome. Currently on Coumadin 15 mg daily (previously was on Eliquis)  Labs to be drawn to recheck the presence of persistent lupus anticoagulant   

## 2017-04-27 NOTE — Progress Notes (Signed)
Patient Care Team: Garnetta Buddy, [place] as PCP - General (Physician Assistant)  DIAGNOSIS:  Encounter Diagnosis  Name Primary?  . Other pulmonary embolism without acute cor pulmonale, unspecified chronicity (HCC)     CHIEF COMPLIANT: Follow-up on lupus anticoagulant testing  INTERVAL HISTORY: Hayley Webb is a 31 year old with above-mentioned history pulmonary embolism was found to have positive lupus anticoagulant test. She is here today to repeat blood tests and to find out if she has persistent lupus anticoagulant. This would then diagnosed with antiphospholipid antibody syndrome. She was in the emergency room complaining of chest pressure as well as headaches. She is currently on Coumadin and is tolerating it very well.  REVIEW OF SYSTEMS:   Constitutional: Denies fevers, chills or abnormal weight loss Eyes: Denies blurriness of vision Ears, nose, mouth, throat, and face: Denies mucositis or sore throat Respiratory: Denies cough, dyspnea or wheezes Cardiovascular: Denies palpitation, chest discomfort Gastrointestinal:  Denies nausea, heartburn or change in bowel habits Skin: Denies abnormal skin rashes Lymphatics: Denies new lymphadenopathy or easy bruising Neurological:Denies numbness, tingling or new weaknesses Behavioral/Psych: Mood is stable, no new changes  Extremities: No lower extremity edema Breast:  denies any pain or lumps or nodules in either breasts All other systems were reviewed with the patient and are negative.  I have reviewed the past medical history, past surgical history, social history and family history with the patient and they are unchanged from previous note.  ALLERGIES:  is allergic to bactrim; prednisone; nsaids; other; pineapple; procardia [nifedipine]; and vicodin [hydrocodone-acetaminophen].  MEDICATIONS:  Current Outpatient Prescriptions  Medication Sig Dispense Refill  . acetaminophen (TYLENOL) 500 MG tablet Take 1,000 mg by mouth every  6 (six) hours as needed for headache (pain).     Marland Kitchen albuterol (PROVENTIL HFA;VENTOLIN HFA) 108 (90 Base) MCG/ACT inhaler Inhale 2 puffs into the lungs every 6 (six) hours as needed for wheezing or shortness of breath.    Marland Kitchen albuterol (PROVENTIL) (2.5 MG/3ML) 0.083% nebulizer solution Take 2.5 mg by nebulization every 6 (six) hours as needed for wheezing or shortness of breath.    Marland Kitchen apixaban (ELIQUIS) 5 MG TABS tablet Take 1 tablet (5 mg total) by mouth 2 (two) times daily. 60 tablet 11  . hydroxychloroquine (PLAQUENIL) 200 MG tablet Take 200 mg by mouth daily.    . meloxicam (MOBIC) 7.5 MG tablet Take 1 tablet (7.5 mg total) by mouth 2 (two) times daily. (Patient taking differently: Take 7.5 mg by mouth daily. ) 60 tablet 0  . methocarbamol (ROBAXIN) 500 MG tablet Take 1 tablet (500 mg total) by mouth 2 (two) times daily. 20 tablet 0  . methylPREDNISolone (MEDROL) 4 MG tablet Take 4 mg by mouth daily.    Marland Kitchen oxyCODONE-acetaminophen (PERCOCET/ROXICET) 5-325 MG tablet Take 1 tablet by mouth every 6 (six) hours as needed for moderate pain. 30 tablet 0  . senna (SENOKOT) 8.6 MG TABS tablet Take 1 tablet (8.6 mg total) by mouth 2 (two) times daily. 120 each 0  . traMADol (ULTRAM) 50 MG tablet Take 1-2 tablets (50-100 mg total) by mouth every 6 (six) hours as needed for severe pain. 30 tablet 0   No current facility-administered medications for this visit.     PHYSICAL EXAMINATION: ECOG PERFORMANCE STATUS: 1 - Symptomatic but completely ambulatory  Vitals:   04/27/17 1108  BP: (!) 133/100  Pulse: 85  Resp: 19  Temp: 98.6 F (37 C)  SpO2: 99%   Filed Weights   04/27/17 1108  Weight: Marland Kitchen)  335 lb 12.8 oz (152.3 kg)    GENERAL:alert, no distress and comfortable SKIN: skin color, texture, turgor are normal, no rashes or significant lesions EYES: normal, Conjunctiva are pink and non-injected, sclera clear OROPHARYNX:no exudate, no erythema and lips, buccal mucosa, and tongue normal  NECK: supple,  thyroid normal size, non-tender, without nodularity LYMPH:  no palpable lymphadenopathy in the cervical, axillary or inguinal LUNGS: clear to auscultation and percussion with normal breathing effort HEART: regular rate & rhythm and no murmurs and no lower extremity edema ABDOMEN:abdomen soft, non-tender and normal bowel sounds MUSCULOSKELETAL:no cyanosis of digits and no clubbing  NEURO: alert & oriented x 3 with fluent speech, no focal motor/sensory deficits EXTREMITIES: No lower extremity edema  LABORATORY DATA:  I have reviewed the data as listed   Chemistry      Component Value Date/Time   NA 136 04/04/2017 1227   NA 141 03/22/2017 1529   K 3.8 04/04/2017 1227   CL 104 04/04/2017 1227   CO2 24 04/04/2017 1227   BUN 12 04/04/2017 1227   BUN 13 03/22/2017 1529   CREATININE 0.70 04/04/2017 1227      Component Value Date/Time   CALCIUM 9.3 04/04/2017 1227   ALKPHOS 111 03/22/2017 1529   AST 13 03/22/2017 1529   ALT 10 03/22/2017 1529   BILITOT <0.2 03/22/2017 1529       Lab Results  Component Value Date   WBC 5.0 04/04/2017   HGB 11.7 (L) 04/04/2017   HCT 36.3 04/04/2017   MCV 83.1 04/04/2017   PLT 393 04/04/2017   NEUTROABS 1.9 02/01/2017    ASSESSMENT & PLAN:  Pulmonary embolus (HCC) Pulmonary embolism diagnosed 12/30/2016 when she presented with chest pain and leg pains: Right lower lobe pulmonary artery  It appears that she is no longer having chest pain symptoms.  Current treatment: Anticoagulation for life because of antiphospholipid antibody syndrome. Currently on Coumadin 15 mg daily (previously was on Eliquis). Patient would like to switch back to Eliquis primarily because of inconvenience related to Coumadin therapy. I sent a new prescription today. If she has any trouble from this, we will have to switch her back to Coumadin.  Labs to be drawn to recheck the presence of persistent lupus anticoagulant I will call her with the results of lupus  anticoagulant testing that are done today. Return to clinic in 1 year for follow-up with labs done ahead of time.  I spent 25 minutes talking to the patient of which more than half was spent in counseling and coordination of care.  Orders Placed This Encounter  Procedures  . Lupus anticoagulant panel    Standing Status:   Future    Number of Occurrences:   1    Standing Expiration Date:   06/01/2018   The patient has a good understanding of the overall plan. she agrees with it. she will call with any problems that may develop before the next visit here.   Sabas Sous, MD 04/27/17

## 2017-05-01 ENCOUNTER — Telehealth: Payer: Self-pay | Admitting: Hematology and Oncology

## 2017-05-01 LAB — LUPUS ANTICOAGULANT PANEL
DRVVT MIX: 41.1 s (ref 0.0–47.0)
PTT-LA: 44.3 s (ref 0.0–51.9)
dRVVT: 50 s — ABNORMAL HIGH (ref 0.0–47.0)

## 2017-05-01 NOTE — Telephone Encounter (Signed)
I called the patient and informed her that the lupus anticoagulant test came back negative. We will plan on continuing anticoagulation and next year we'll repeat the test. If the repeat test is negative then she can come off anticoagulation.

## 2017-05-02 DIAGNOSIS — J029 Acute pharyngitis, unspecified: Secondary | ICD-10-CM | POA: Diagnosis not present

## 2017-05-25 ENCOUNTER — Telehealth: Payer: Self-pay | Admitting: Physician Assistant

## 2017-05-25 NOTE — Telephone Encounter (Signed)
I have 2 pieces of paper work that may be fmla.  However I have not seen this patient in more than 1 month in the clinic, and 2 months where I have seen the patient, despite my repeated contact for her to follow up.  At this time, she may be having her paperwork submitted by a specialist?...placing the information in the fmla box.

## 2017-07-03 ENCOUNTER — Emergency Department (HOSPITAL_COMMUNITY)
Admission: EM | Admit: 2017-07-03 | Discharge: 2017-07-03 | Disposition: A | Discharge status: Home / Self Care | Payer: 59 | Attending: Emergency Medicine | Admitting: Emergency Medicine

## 2017-07-03 ENCOUNTER — Emergency Department (HOSPITAL_COMMUNITY): Payer: 59

## 2017-07-03 ENCOUNTER — Encounter (HOSPITAL_COMMUNITY): Payer: Self-pay | Admitting: Emergency Medicine

## 2017-07-03 DIAGNOSIS — J181 Lobar pneumonia, unspecified organism: Secondary | ICD-10-CM | POA: Insufficient documentation

## 2017-07-03 DIAGNOSIS — Z79899 Other long term (current) drug therapy: Secondary | ICD-10-CM | POA: Diagnosis not present

## 2017-07-03 DIAGNOSIS — R311 Benign essential microscopic hematuria: Secondary | ICD-10-CM | POA: Insufficient documentation

## 2017-07-03 DIAGNOSIS — Z86711 Personal history of pulmonary embolism: Secondary | ICD-10-CM | POA: Insufficient documentation

## 2017-07-03 DIAGNOSIS — J45909 Unspecified asthma, uncomplicated: Secondary | ICD-10-CM | POA: Insufficient documentation

## 2017-07-03 DIAGNOSIS — Z7901 Long term (current) use of anticoagulants: Secondary | ICD-10-CM | POA: Diagnosis not present

## 2017-07-03 DIAGNOSIS — R05 Cough: Secondary | ICD-10-CM | POA: Diagnosis present

## 2017-07-03 DIAGNOSIS — J189 Pneumonia, unspecified organism: Secondary | ICD-10-CM

## 2017-07-03 LAB — URINALYSIS, ROUTINE W REFLEX MICROSCOPIC
Bilirubin Urine: NEGATIVE
GLUCOSE, UA: NEGATIVE mg/dL
Ketones, ur: 5 mg/dL — AB
LEUKOCYTES UA: NEGATIVE
NITRITE: NEGATIVE
Specific Gravity, Urine: 1.035 — ABNORMAL HIGH (ref 1.005–1.030)
pH: 5 (ref 5.0–8.0)

## 2017-07-03 LAB — BASIC METABOLIC PANEL
ANION GAP: 10 (ref 5–15)
BUN: 10 mg/dL (ref 6–20)
CHLORIDE: 102 mmol/L (ref 101–111)
CO2: 24 mmol/L (ref 22–32)
Calcium: 9.3 mg/dL (ref 8.9–10.3)
Creatinine, Ser: 0.74 mg/dL (ref 0.44–1.00)
GFR calc non Af Amer: >60 mL/min (ref >60)
Glucose, Bld: 103 mg/dL — ABNORMAL HIGH (ref 65–99)
POTASSIUM: 3.5 mmol/L (ref 3.5–5.1)
SODIUM: 136 mmol/L (ref 135–145)

## 2017-07-03 LAB — CBC
HEMATOCRIT: 35.8 % — AB (ref 36.0–46.0)
Hemoglobin: 11.9 g/dL — ABNORMAL LOW (ref 12.0–15.0)
MCH: 27 pg (ref 26.0–34.0)
MCHC: 33.2 g/dL (ref 30.0–36.0)
MCV: 81.2 fL (ref 78.0–100.0)
Platelets: 423 10*3/uL — ABNORMAL HIGH (ref 150–400)
RBC: 4.41 MIL/uL (ref 3.87–5.11)
RDW: 14.9 % (ref 11.5–15.5)
WBC: 10.2 10*3/uL (ref 4.0–10.5)

## 2017-07-03 LAB — I-STAT BETA HCG BLOOD, ED (MC, WL, AP ONLY)

## 2017-07-03 LAB — I-STAT TROPONIN, ED: Troponin i, poc: 0 ng/mL (ref 0.00–0.08)

## 2017-07-03 MED ORDER — MORPHINE SULFATE (PF) 4 MG/ML IV SOLN
4.0000 mg | Freq: Once | INTRAVENOUS | Status: DC
  Filled 2017-07-03: qty 1

## 2017-07-03 MED ORDER — TRAMADOL HCL 50 MG PO TABS: 50.0000 mg | ORAL_TABLET | Freq: Four times a day (QID) | ORAL | 0 refills | Status: DC | PRN

## 2017-07-03 MED ORDER — ONDANSETRON 4 MG PO TBDP
4.0000 mg | ORAL_TABLET | Freq: Once | ORAL | Status: AC
  Administered 2017-07-03: 4 mg via ORAL
  Filled 2017-07-03: qty 1

## 2017-07-03 MED ORDER — AZITHROMYCIN 250 MG PO TABS
500.0000 mg | ORAL_TABLET | Freq: Once | ORAL | Status: AC
  Administered 2017-07-03: 500 mg via ORAL
  Filled 2017-07-03: qty 2

## 2017-07-03 MED ORDER — SODIUM CHLORIDE 0.9 % IV BOLUS (SEPSIS): 1000.0000 mL | Freq: Once | INTRAVENOUS | Status: DC

## 2017-07-03 MED ORDER — ONDANSETRON HCL 4 MG/2ML IJ SOLN
4.0000 mg | Freq: Once | INTRAMUSCULAR | Status: DC
  Filled 2017-07-03: qty 2

## 2017-07-03 MED ORDER — LIDOCAINE HCL (PF) 1 % IJ SOLN
INTRAMUSCULAR | Status: AC
  Administered 2017-07-03: 5 mL
  Filled 2017-07-03: qty 5

## 2017-07-03 MED ORDER — CEFTRIAXONE SODIUM 1 G IJ SOLR
1.0000 g | Freq: Once | INTRAMUSCULAR | Status: AC
  Administered 2017-07-03: 1 g via INTRAMUSCULAR
  Filled 2017-07-03: qty 10

## 2017-07-03 MED ORDER — AZITHROMYCIN 250 MG PO TABS: ORAL_TABLET | ORAL | 0 refills | Status: DC

## 2017-07-03 MED ORDER — MORPHINE SULFATE (PF) 4 MG/ML IV SOLN
4.0000 mg | Freq: Once | INTRAVENOUS | Status: AC
  Administered 2017-07-03: 4 mg via INTRAMUSCULAR
  Filled 2017-07-03: qty 1

## 2017-07-03 NOTE — ED Provider Notes (Signed)
[facility]-EMERGENCY DEPT Provider Note   CSN: [id number] Arrival date & time: 07/03/17  1547     History   Chief Complaint Chief Complaint  Patient presents with  . Chest Pain  . Hematuria  . Flank Pain    HPI Hayley Webb is a 31 y.o. female.  Pt presents to the ED today with cough and sob.  She said she has pain throughout.  She has also seen blood in her urine, but denies CP.  The pt is on Eliquis for PE, but has not taken it in a week.        Past Medical History:  Diagnosis Date  . Anxiety    doing good now  . Asthma   . Headache(784.0)   . HSV infection   . Infection    UTI  . Lupus    dx age 73  . Pulmonary embolism (HCC) 06/4/82011  . 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 Chest pain 04/04/2017  . On continuous oral anticoagulation 04/04/2017  . 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 Nonintractable headache 04/04/2017  . Altered thought processes 04/04/2017  . Word finding difficulty 04/04/2017  . Antiphospholipid antibody syndrome (HCC) 02/10/2017  . Pulmonary embolus (HCC) 02/09/2017  . Pulmonary embolism (HCC) 12/26/2016  . Shortness of breath   . Vaginal delivery--VE assist 10/25/2013  . 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 Obesity-BMI 47 05/09/2013  . Hirsutism 10/02/2012  . STD (female) 01/18/2012  . Lupus   . Asthma   . HSV infection     Past Surgical History:  Procedure Laterality Date  . MOUTH SURGERY     2 teeth removed- 1 wisdom and 1 in front of it  . TONSILLECTOMY AND ADENOIDECTOMY  1998  . WISDOM TOOTH EXTRACTION      OB History    Gravida Para Term Preterm AB Living   2 1 1   1 1    SAB TAB Ectopic Multiple Live Births   1       1       Home Medications    Prior to Admission medications   Medication Sig Start Date End Date Taking?  Authorizing Provider  acetaminophen (TYLENOL) 500 MG tablet Take 1,000 mg by mouth every 6 (six) hours as needed for headache (pain).    Yes [provider]  albuterol (PROVENTIL HFA;VENTOLIN HFA) 108 (90 Base) MCG/ACT inhaler Inhale 2 puffs into the lungs every 6 (six) hours as needed for wheezing or shortness of breath.   Yes [provider]  albuterol (PROVENTIL) (2.5 MG/3ML) 0.083% nebulizer solution Take 2.5 mg by nebulization every 6 (six) hours as needed for wheezing or shortness of breath.   Yes [provider]  apixaban (ELIQUIS) 5 MG TABS tablet Take 1 tablet (5 mg total) by mouth 2 (two) times daily. 04/27/17  Yes Serena Croissant, MD  cefdinir (OMNICEF) 300 MG capsule Take 300 mg by mouth 2 (two) times daily.   Yes [provider]  Dextromethorphan-Guaifenesin 5-100 MG/5ML LIQD Take 20 mLs by mouth as needed (cough).   Yes [provider]  hydroxychloroquine (PLAQUENIL) 200 MG tablet Take 200 mg by mouth daily.   Yes [provider]  meloxicam (MOBIC) 7.5 MG tablet Take 1 tablet (7.5 mg total) by mouth 2 (two) times daily. Patient taking differently: Take 7.5  mg by mouth daily.  02/19/17  Yes Calvert Cantorizwan, Saima, MD  methylPREDNISolone (MEDROL) 4 MG tablet Take 4 mg by mouth daily.   Yes [provider]  oxyCODONE-acetaminophen (PERCOCET/ROXICET) 5-325 MG tablet Take 1 tablet by mouth every 6 (six) hours as needed for moderate pain. 01/02/17  Yes Regalado, Belkys A, MD  promethazine (PHENERGAN) 6.25 MG/5ML syrup Take 6.25 mg by mouth every 6 (six) hours as needed for nausea or vomiting.   Yes [provider]  senna (SENOKOT) 8.6 MG TABS tablet Take 1 tablet (8.6 mg total) by mouth 2 (two) times daily. Patient taking differently: Take 1 tablet by mouth daily as needed for mild constipation.  01/02/17  Yes Regalado, Belkys A, MD  azithromycin (ZITHROMAX) 250 MG tablet Take as directed. 07/04/17   Jacalyn LefevreHaviland, Fernandez Kenley, MD  traMADol  (ULTRAM) 50 MG tablet Take 1 tablet (50 mg total) by mouth every 6 (six) hours as needed. 07/03/17   Jacalyn LefevreHaviland, Olon Russ, MD    Family History Family History  Problem Relation Age of Onset  . Diabetes Maternal Aunt   . Cancer Maternal Grandmother 72       COLON CA  . Diabetes Maternal Grandmother   . Hypertension Maternal Grandfather   . Cancer Maternal Grandfather        prostate  . Asthma Mother   . Asthma Brother   . Cancer Paternal Grandmother        breast  . Lupus Paternal Grandmother   . Anesthesia problems Neg Hx   . Hypotension Neg Hx   . Malignant hyperthermia Neg Hx   . Pseudochol deficiency Neg Hx     Social History Social History   Tobacco Use  . Smoking status: Never Smoker  . Smokeless tobacco: Never Used  Substance Use Topics  . Alcohol use: No  . Drug use: No     Allergies   Bactrim; Prednisone; Nsaids; Other; Pineapple; Procardia [nifedipine]; and Vicodin [hydrocodone-acetaminophen]   Review of Systems Review of Systems  Constitutional: Positive for chills.  Respiratory: Positive for cough and shortness of breath.   Cardiovascular: Positive for chest pain.  All other systems reviewed and are negative.    Physical Exam Updated Vital Signs BP (!) 174/113 (BP Location: Left Arm)   Pulse (!) 112   Temp 98.4 F (36.9 C) (Oral)   Resp 18   Ht 5' 8.5" (1.74 m)   Wt (!) 154.7 kg (341 lb)   LMP 06/15/2017   SpO2 100%   BMI 51.09 kg/m   Physical Exam  Constitutional: She is oriented to person, place, and time. She appears well-developed and well-nourished.  HENT:  Head: Normocephalic and atraumatic.  Eyes: EOM are normal. Pupils are equal, round, and reactive to light.  Neck: Normal range of motion. Neck supple.  Cardiovascular: Regular rhythm, intact distal pulses and normal pulses. Tachycardia present.  Pulmonary/Chest: Effort normal and breath sounds normal.  Abdominal: Soft. Bowel sounds are normal.  Musculoskeletal: Normal range of  motion.       Right lower leg: Normal.       Left lower leg: Normal.  Neurological: She is alert and oriented to person, place, and time.  Skin: Skin is warm. Capillary refill takes less than 2 seconds.  Psychiatric: She has a normal mood and affect. Her behavior is normal.  Nursing note and vitals reviewed.    ED Treatments / Results  Labs (all labs ordered are listed, but only abnormal results are displayed) Labs Reviewed  BASIC METABOLIC  PANEL - Abnormal; Notable for the following components:      Result Value   Glucose, Bld 103 (*)    All other components within normal limits  CBC - Abnormal; Notable for the following components:   Hemoglobin 11.9 (*)    HCT 35.8 (*)    Platelets 423 (*)    All other components within normal limits  URINALYSIS, ROUTINE W REFLEX MICROSCOPIC - Abnormal; Notable for the following components:   Color, Urine AMBER (*)    APPearance HAZY (*)    Specific Gravity, Urine 1.035 (*)    Hgb urine dipstick LARGE (*)    Ketones, ur 5 (*)    Protein, ur >=300 (*)    Bacteria, UA RARE (*)    Squamous Epithelial / LPF 0-5 (*)    Crystals PRESENT (*)    All other components within normal limits  I-STAT TROPONIN, ED  I-STAT BETA HCG BLOOD, ED ([facility], [facility], [facility] ONLY)    EKG  EKG Interpretation  Date/Time:  Tuesday July 03 2017 16:29:01 EST Ventricular Rate:  123 PR Interval:    QRS Duration: 80 QT Interval:  332 QTC Calculation: 475 R Axis:   65 Text Interpretation:  Sinus tachycardia Borderline T abnormalities, anterior leads Confirmed by Jacalyn LefevreHaviland, Yeudiel Mateo [phone]) on 07/03/2017 9:01:41 PM       Radiology Dg Chest 2 View  Result Date: 07/03/2017 CLINICAL DATA:  Productive cough and dyspnea with central chest pain x1 week. EXAM: CHEST  2 VIEW COMPARISON:  04/04/2017 CT and CXR FINDINGS: The heart size and mediastinal contours are within normal limits. New alveolar consolidation in the right lower lobe. No effusion or pneumothorax. No acute osseous  abnormality. IMPRESSION: Right lower lobe pneumonia. Electronically Signed   By: Tollie Ethavid  Kwon M.D.   On: 07/03/2017 18:12    Procedures Procedures (including critical care time)  Medications Ordered in ED Medications  cefTRIAXone (ROCEPHIN) injection 1 g (not administered)  azithromycin (ZITHROMAX) tablet 500 mg (not administered)  morphine 4 MG/ML injection 4 mg (not administered)  ondansetron (ZOFRAN-ODT) disintegrating tablet 4 mg (not administered)     Initial Impression / Assessment and Plan / ED Course  I have reviewed the triage vital signs and the nursing notes.  Pertinent labs & imaging results that were available during my care of the patient were reviewed by me and considered in my medical decision making (see chart for details).     Pt does have PNA seen on CXR.  She was given a dose of rocephin and zithromax prior to d/c.   I wanted to get a ct chest to make sure it was not a PE, but pt requested we not do this as she has had so many CTs.  This is reasonable as she is not hypoxic and sx more c/w pna.  The pt does have some hematuria, but I want pt to restart her Eliquis in case she does have a small PE.  She knows to call her doctor if the hematuria worsens and she knows to return here if she has any concerns.  Final Clinical Impressions(s) / ED Diagnoses   Final diagnoses:  Pneumonia of right lower lobe due to infectious organism [facility](HCC)  Benign essential microscopic hematuria    ED Discharge Orders        Ordered    azithromycin (ZITHROMAX) 250 MG tablet     07/03/17 2215    traMADol (ULTRAM) 50 MG tablet  Every 6 hours PRN  07/03/17 2215       Jacalyn Lefevre, MD 07/03/17 2219

## 2017-07-03 NOTE — Discharge Instructions (Signed)
Go ahead and restart Eliquis.  If blood in urine worsens, then talk with your doctor about what to do.

## 2017-07-03 NOTE — ED Notes (Signed)
Attempted IV access without success. Will have another RN look and will place IV consult as patient states she is a difficult stick. Pt also requested that we start with a dimer versus a CT angio because she is afraid of the risk of cancer. EDP to speak with patient.

## 2017-07-03 NOTE — ED Notes (Signed)
Pt had drawn for labs:  Gold Blue Lavender Lt green Dark green x2 

## 2017-07-03 NOTE — ED Notes (Signed)
Pt verbalizes understanding of d/c paperwork, follow up instructions, and medications. Pt A/O x4, ambulatory. All belongings with patient upon departure.  

## 2017-07-03 NOTE — ED Triage Notes (Signed)
Patient reports that she was seen at Urgent care for her flank pain, body aches and chest pains that have been ongoing for couple days but since she had blood in urine x 2 days and chest pain was told to come to ED. Patient reports taking Eliquis due to blood clots.

## 2017-09-18 ENCOUNTER — Emergency Department (HOSPITAL_COMMUNITY)
Admission: EM | Admit: 2017-09-18 | Discharge: 2017-09-19 | Disposition: A | Discharge status: Home / Self Care | Payer: 59 | Attending: Emergency Medicine | Admitting: Emergency Medicine

## 2017-09-18 ENCOUNTER — Emergency Department (HOSPITAL_COMMUNITY): Payer: 59

## 2017-09-18 ENCOUNTER — Ambulatory Visit: Payer: Self-pay | Admitting: *Deleted

## 2017-09-18 ENCOUNTER — Encounter (HOSPITAL_COMMUNITY): Payer: Self-pay | Admitting: *Deleted

## 2017-09-18 DIAGNOSIS — Z79899 Other long term (current) drug therapy: Secondary | ICD-10-CM | POA: Insufficient documentation

## 2017-09-18 DIAGNOSIS — Z7901 Long term (current) use of anticoagulants: Secondary | ICD-10-CM | POA: Diagnosis not present

## 2017-09-18 DIAGNOSIS — R072 Precordial pain: Secondary | ICD-10-CM

## 2017-09-18 DIAGNOSIS — J45909 Unspecified asthma, uncomplicated: Secondary | ICD-10-CM | POA: Insufficient documentation

## 2017-09-18 DIAGNOSIS — R079 Chest pain, unspecified: Secondary | ICD-10-CM | POA: Diagnosis present

## 2017-09-18 LAB — I-STAT BETA HCG BLOOD, ED (MC, WL, AP ONLY): I-stat hCG, quantitative: <5 m[IU]/mL (ref <5)

## 2017-09-18 LAB — CBC
HEMATOCRIT: 36.9 % (ref 36.0–46.0)
Hemoglobin: 11.9 g/dL — ABNORMAL LOW (ref 12.0–15.0)
MCH: 26.7 pg (ref 26.0–34.0)
MCHC: 32.2 g/dL (ref 30.0–36.0)
MCV: 82.9 fL (ref 78.0–100.0)
PLATELETS: 395 10*3/uL (ref 150–400)
RBC: 4.45 MIL/uL (ref 3.87–5.11)
RDW: 14.7 % (ref 11.5–15.5)
WBC: 7.4 10*3/uL (ref 4.0–10.5)

## 2017-09-18 LAB — BASIC METABOLIC PANEL
Anion gap: 8 (ref 5–15)
BUN: 13 mg/dL (ref 6–20)
CHLORIDE: 107 mmol/L (ref 101–111)
CO2: 24 mmol/L (ref 22–32)
CREATININE: 0.79 mg/dL (ref 0.44–1.00)
Calcium: 9.2 mg/dL (ref 8.9–10.3)
GFR calc Af Amer: >60 mL/min (ref >60)
GFR calc non Af Amer: >60 mL/min (ref >60)
Glucose, Bld: 118 mg/dL — ABNORMAL HIGH (ref 65–99)
POTASSIUM: 3.6 mmol/L (ref 3.5–5.1)
SODIUM: 139 mmol/L (ref 135–145)

## 2017-09-18 LAB — I-STAT TROPONIN, ED: Troponin i, poc: 0.01 ng/mL (ref 0.00–0.08)

## 2017-09-18 MED ORDER — OXYCODONE-ACETAMINOPHEN 5-325 MG PO TABS
1.0000 | ORAL_TABLET | Freq: Once | ORAL | Status: AC
  Administered 2017-09-19: 1 via ORAL
  Filled 2017-09-18: qty 1

## 2017-09-18 NOTE — Telephone Encounter (Signed)
Pt  Has  A  History  Of  3  Blood  Clots    In past  Over  The  Summer    She  Reports  She  Developed   intermittent  Chest  Pain  That  Last  About    5  mins  And reoccurs   In 30  Min incruments  She  Denies  Any  Shortness  Of  Breath  She  Is  Speaking in  Complete  sentances She  Was  Advised  To  Proceed to  Er    Have  Someone  Else drive    Reason for Disposition . History of prior "blood clot" in leg or lungs (i.e., deep vein thrombosis, pulmonary embolism)  Answer Assessment - Initial Assessment Questions 1. LOCATION: "Where does it hurt?"        Chest  Pain  l  Sided   High    2. RADIATION: "Does the pain go anywhere else?" (e.g., into neck, jaw, arms, back)        No  3. ONSET: "When did the chest pain begin?" (Minutes, hours or days)         Started   About  6  Hours   4. PATTERN "Does the pain come and go, or has it been constant since it started?"  "Does it get worse with exertion?"        Comes   And  Goes  Stays   About  The  Same     5. DURATION: "How long does it last" (e.g., seconds, minutes, hours)         Pt  Reports  Pain  Lasts   About 5  Minutes   Goes  Away   For  About  30  mins   6. SEVERITY: "How bad is the pain?"  (e.g., Scale 1-10; mild, moderate, or severe)    - MILD (1-3): doesn't interfere with normal activities     - MODERATE (4-7): interferes with normal activities or awakens from sleep    - SEVERE (8-10): excruciating pain, unable to do any normal activities          Moderate    7. CARDIAC RISK FACTORS: "Do you have any history of heart problems or risk factors for heart disease?" (e.g., prior heart attack, angina; high blood pressure, diabetes, being overweight, high cholesterol, smoking, or strong family history of heart disease)     OVERWEIGHT  8. PULMONARY RISK FACTORS: "Do you have any history of lung disease?"  (e.g., blood clots in lung, asthma, emphysema, birth control pills)     Blood   Clots   9. CAUSE: "What do you think is causing the chest  pain?"       Lupus   10. OTHER SYMPTOMS: "Do you have any other symptoms?" (e.g., dizziness, nausea, vomiting, sweating, fever, difficulty breathing, cough)         NO  11. PREGNANCY: "Is there any chance you are pregnant?" "When was your last menstrual period?"         LMP   JAN  26  Protocols used: CHEST PAIN-A-AH

## 2017-09-18 NOTE — ED Triage Notes (Signed)
Pt reports L sided cp that radiates to her L arm since 1100 today.  Pt reports SOB and dizziness with the cp.  Pt reports hx of 3 PEs last year.  She has hx of Lupus.  Pt is in NAD.  A&Ox 4.

## 2017-09-18 NOTE — ED Provider Notes (Signed)
[facility]-EMERGENCY DEPT Provider Note   CSN: [id number] Arrival date & time: 09/18/17  2112     History   Chief Complaint Chief Complaint  Patient presents with  . Chest Pain    HPI Hayley Webb is a 32 y.o. female.  The history is provided by the patient.  Chest Pain   This is a new problem. The current episode started 6 to 12 hours ago. The problem occurs constantly. The problem has not changed since onset.Pain location: Left chest. The pain is moderate. The quality of the pain is described as pressure-like. The pain radiates to the left shoulder. Associated symptoms include dizziness and shortness of breath. Pertinent negatives include no abdominal pain, no back pain, no cough, no diaphoresis, no hemoptysis, no syncope and no vomiting. She has tried nothing for the symptoms. Risk factors include obesity.  Her past medical history is significant for PE.   Patient with known history of lupus, history of PE on Eliquis presents with chest pain.  She reports onset of chest pain over 12 hours ago has been constant. Pain is not radiating to back, no tearing sensation into her back.  No new weakness is reported She reports history of PE, this feels similar to prior PE.  She does admit to missing several days of her Eliquis.  She now has her Eliquis and will restart it She is a non-smoker Past Medical History:  Diagnosis Date  . Anxiety    doing good now  . Asthma   . Headache(784.0)   . HSV infection   . Infection    UTI  . Lupus    dx age 32  . Pulmonary embolism (HCC) 06/4/82011  . 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 Chest pain 04/04/2017  . On continuous oral anticoagulation 04/04/2017  . 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 Nonintractable headache 04/04/2017  . Altered thought processes 04/04/2017  . Word finding difficulty 04/04/2017  . 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 Pulmonary embolus (HCC) 02/09/2017  . Pulmonary embolism (HCC) 12/26/2016  . Shortness of breath   . Vaginal delivery--VE assist 10/25/2013  . 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 Obesity-BMI 47 05/09/2013  . Hirsutism 10/02/2012  . STD (female) 01/18/2012  . Lupus   . Asthma   . HSV infection     Past Surgical History:  Procedure Laterality Date  . MOUTH SURGERY     2 teeth removed- 1 wisdom and 1 in front of it  . TONSILLECTOMY AND ADENOIDECTOMY  1998  . WISDOM TOOTH EXTRACTION      OB History    Gravida Para Term Preterm AB Living   2 1 1   1 1    SAB TAB Ectopic Multiple Live Births   1       1       Home Medications    Prior to Admission medications   Medication Sig Start Date End Date Taking? Authorizing Provider  acetaminophen (TYLENOL) 500 MG tablet Take 1,000 mg by mouth every 6 (six) hours as needed for headache (pain).    Yes [provider]  albuterol (PROVENTIL HFA;VENTOLIN HFA) 108 (90 Base) MCG/ACT inhaler Inhale 2 puffs into the lungs every 6 (six) hours as needed for wheezing or shortness of breath.   Yes [provider]  hydroxychloroquine (PLAQUENIL)  200 MG tablet Take 200 mg by mouth daily.   Yes [provider]  methylPREDNISolone (MEDROL) 4 MG tablet Take 4 mg by mouth daily.   Yes [provider]  traMADol (ULTRAM) 50 MG tablet Take 1 tablet (50 mg total) by mouth every 6 (six) hours as needed. Patient taking differently: Take 50 mg by mouth every 6 (six) hours as needed for moderate pain or severe pain.  07/03/17  Yes Jacalyn Lefevre, MD  albuterol (PROVENTIL) (2.5 MG/3ML) 0.083% nebulizer solution Take 2.5 mg by nebulization every 6 (six) hours as needed for wheezing or shortness of breath.    [provider]  apixaban (ELIQUIS) 5 MG TABS tablet Take 1 tablet (5 mg total) by mouth 2 (two) times  daily. 04/27/17   Serena Croissant, MD  azithromycin (ZITHROMAX) 250 MG tablet Take as directed. Patient not taking: Reported on 09/18/2017 07/04/17   Jacalyn Lefevre, MD  meloxicam (MOBIC) 7.5 MG tablet Take 1 tablet (7.5 mg total) by mouth 2 (two) times daily. Patient taking differently: Take 7.5 mg by mouth daily as needed for pain.  02/19/17   Calvert Cantor, MD  oxyCODONE-acetaminophen (PERCOCET/ROXICET) 5-325 MG tablet Take 1 tablet by mouth every 6 (six) hours as needed for moderate pain. Patient not taking: Reported on 09/18/2017 01/02/17   Regalado, Jon Billings A, MD  promethazine (PHENERGAN) 6.25 MG/5ML syrup Take 6.25 mg by mouth every 6 (six) hours as needed for nausea or vomiting.    [provider]  senna (SENOKOT) 8.6 MG TABS tablet Take 1 tablet (8.6 mg total) by mouth 2 (two) times daily. Patient not taking: Reported on 09/18/2017 01/02/17   Alba Cory, MD    Family History Family History  Problem Relation Age of Onset  . Diabetes Maternal Aunt   . Cancer Maternal Grandmother 72       COLON CA  . Diabetes Maternal Grandmother   . Hypertension Maternal Grandfather   . Cancer Maternal Grandfather        prostate  . Asthma Mother   . Asthma Brother   . Cancer Paternal Grandmother        breast  . Lupus Paternal Grandmother   . Anesthesia problems Neg Hx   . Hypotension Neg Hx   . Malignant hyperthermia Neg Hx   . Pseudochol deficiency Neg Hx     Social History Social History   Tobacco Use  . Smoking status: Never Smoker  . Smokeless tobacco: Never Used  Substance Use Topics  . Alcohol use: No  . Drug use: No     Allergies   Bactrim; Prednisone; Nsaids; Other; Pineapple; Procardia [nifedipine]; and Vicodin [hydrocodone-acetaminophen]   Review of Systems Review of Systems  Constitutional: Negative for diaphoresis.  Respiratory: Positive for shortness of breath. Negative for cough and hemoptysis.   Cardiovascular: Positive for chest pain. Negative for  syncope.  Gastrointestinal: Negative for abdominal pain and vomiting.  Genitourinary: Positive for flank pain.  Musculoskeletal: Negative for back pain.  Neurological: Positive for dizziness. Negative for syncope.  All other systems reviewed and are negative.    Physical Exam Updated Vital Signs BP (!) 169/107 (BP Location: Left Arm)   Pulse 91   Temp 98.2 F (36.8 C) (Oral)   Resp 18   Ht 1.74 m (5' 8.5")   Wt (!) 149.7 kg (330 lb)   LMP 08/18/2017   SpO2 100%   BMI 49.45 kg/m   Physical Exam  CONSTITUTIONAL: Well developed/well nourished, smiling, no distress, watching  television HEAD: Normocephalic/atraumatic EYES: EOMI/PERRL ENMT: Mucous membranes moist NECK: supple no meningeal signs SPINE/BACK:entire spine nontender CV: S1/S2 noted, no murmurs/rubs/gallops noted LUNGS: Lungs are clear to auscultation bilaterally, no apparent distress ABDOMEN: soft, nontender, no rebound or guarding, bowel sounds noted throughout abdomen GU:no cva tenderness NEURO: Pt is awake/alert/appropriate, moves all extremitiesx4.  No facial droop.   EXTREMITIES: pulses normal/equal, full ROM, no calf tenderness or erythema SKIN: warm, color normal PSYCH: no abnormalities of mood noted, alert and oriented to situation  ED Treatments / Results  Labs (all labs ordered are listed, but only abnormal results are displayed) Labs Reviewed  BASIC METABOLIC PANEL - Abnormal; Notable for the following components:      Result Value   Glucose, Bld 118 (*)    All other components within normal limits  CBC - Abnormal; Notable for the following components:   Hemoglobin 11.9 (*)    All other components within normal limits  URINALYSIS, ROUTINE W REFLEX MICROSCOPIC - Abnormal; Notable for the following components:   Hgb urine dipstick MODERATE (*)    Ketones, ur 5 (*)    Protein, ur 30 (*)    Bacteria, UA RARE (*)    Squamous Epithelial / LPF 0-5 (*)    All other components within normal limits    I-STAT TROPONIN, ED  I-STAT BETA HCG BLOOD, ED ([facility], [facility], [facility] ONLY)    EKG  EKG Interpretation  Date/Time:  Tuesday September 18 2017 21:32:24 EST Ventricular Rate:  99 PR Interval:  152 QRS Duration: 98 QT Interval:  376 QTC Calculation: 482 R Axis:   8 Text Interpretation:  Normal sinus rhythm Prolonged QT Abnormal ECG No significant change since last tracing Confirmed by Zadie Rhine ([id number]) on 09/18/2017 11:10:13 PM       Radiology Dg Chest 2 View  Result Date: 09/18/2017 CLINICAL DATA:  Chest pain EXAM: CHEST  2 VIEW COMPARISON:  07/03/2017 FINDINGS: Small focus of atelectasis or inflammatory change in the left lower lung. No consolidation. No pleural effusion. Normal heart size. No pneumothorax. IMPRESSION: Small focus of atelectasis or inflammatory change in the left lung base. Electronically Signed   By: Jasmine Pang M.D.   On: 09/18/2017 22:39    Procedures Procedures (including critical care time)  Medications Ordered in ED Medications  oxyCODONE-acetaminophen (PERCOCET/ROXICET) 5-325 MG per tablet 1 tablet (1 tablet Oral Given 09/19/17 0023)     Initial Impression / Assessment and Plan / ED Course  I have reviewed the triage vital signs and the nursing notes.  Pertinent labs & imaging results that were available during my care of the patient were reviewed by me and considered in my medical decision making (see chart for details).     11:34 PM Patient with known history of PE, has missed Eliquis for the past 4 days.  She feels this is similar to prior PE, but at this point is not unstable, no tachycardia, no hypoxia.  At this point, will defer further testing, and she is advised to restart her Eliquis.  Patient is agreeable with this plan She also reports some mild left flank pain is unrelated to her chest pain.  Will check urinalysis 2:04 AM Patient stable, no hypoxia.  She did ambulate, with no hypoxia, denies dyspnea on exertion.  She had some chest pain  after walking. After further discussion, patient would like to go home and restart her Eliquis. She is low risk, no hypoxia.  PESI score is low risk Feel this is reasonable  due to her multiple CT scans before, she will need to be on anticoagulation anyways.  We agree to avoid CT chest   We discussed strict return precautions.  My suspicion for ACS or other acute cardiopulmonary emergency is low Final Clinical Impressions(s) / ED Diagnoses   Final diagnoses:  None    ED Discharge Orders    None       Zadie Rhine, MD 09/19/17 0206

## 2017-09-19 LAB — URINALYSIS, ROUTINE W REFLEX MICROSCOPIC
BILIRUBIN URINE: NEGATIVE
Glucose, UA: NEGATIVE mg/dL
KETONES UR: 5 mg/dL — AB
LEUKOCYTES UA: NEGATIVE
Nitrite: NEGATIVE
PH: 6 (ref 5.0–8.0)
Protein, ur: 30 mg/dL — AB
Specific Gravity, Urine: 1.03 (ref 1.005–1.030)

## 2017-09-19 NOTE — Discharge Instructions (Signed)

## 2017-09-25 ENCOUNTER — Ambulatory Visit: Payer: 59 | Admitting: Urgent Care

## 2017-10-31 ENCOUNTER — Encounter: Payer: Self-pay | Admitting: Physician Assistant

## 2017-11-19 ENCOUNTER — Telehealth: Payer: Self-pay | Admitting: Physician Assistant

## 2017-11-19 ENCOUNTER — Emergency Department (HOSPITAL_COMMUNITY)
Admission: EM | Admit: 2017-11-19 | Discharge: 2017-11-19 | Disposition: A | Discharge status: Home / Self Care | Payer: 59 | Attending: Emergency Medicine | Admitting: Emergency Medicine

## 2017-11-19 ENCOUNTER — Encounter (HOSPITAL_COMMUNITY): Payer: Self-pay | Admitting: Emergency Medicine

## 2017-11-19 ENCOUNTER — Emergency Department (HOSPITAL_COMMUNITY): Payer: 59

## 2017-11-19 ENCOUNTER — Ambulatory Visit: Payer: Self-pay | Admitting: *Deleted

## 2017-11-19 DIAGNOSIS — R079 Chest pain, unspecified: Secondary | ICD-10-CM | POA: Diagnosis present

## 2017-11-19 DIAGNOSIS — Z7901 Long term (current) use of anticoagulants: Secondary | ICD-10-CM | POA: Insufficient documentation

## 2017-11-19 DIAGNOSIS — J45909 Unspecified asthma, uncomplicated: Secondary | ICD-10-CM | POA: Insufficient documentation

## 2017-11-19 DIAGNOSIS — R110 Nausea: Secondary | ICD-10-CM

## 2017-11-19 DIAGNOSIS — Z79899 Other long term (current) drug therapy: Secondary | ICD-10-CM | POA: Insufficient documentation

## 2017-11-19 DIAGNOSIS — R609 Edema, unspecified: Secondary | ICD-10-CM

## 2017-11-19 DIAGNOSIS — R0602 Shortness of breath: Secondary | ICD-10-CM

## 2017-11-19 DIAGNOSIS — R600 Localized edema: Secondary | ICD-10-CM

## 2017-11-19 LAB — CBC
HCT: 38.7 % (ref 36.0–46.0)
Hemoglobin: 12.5 g/dL (ref 12.0–15.0)
MCH: 26.9 pg (ref 26.0–34.0)
MCHC: 32.3 g/dL (ref 30.0–36.0)
MCV: 83.2 fL (ref 78.0–100.0)
PLATELETS: 387 10*3/uL (ref 150–400)
RBC: 4.65 MIL/uL (ref 3.87–5.11)
RDW: 14.2 % (ref 11.5–15.5)
WBC: 6.1 10*3/uL (ref 4.0–10.5)

## 2017-11-19 LAB — BASIC METABOLIC PANEL
Anion gap: 10 (ref 5–15)
BUN: 11 mg/dL (ref 6–20)
CHLORIDE: 104 mmol/L (ref 101–111)
CO2: 24 mmol/L (ref 22–32)
CREATININE: 0.82 mg/dL (ref 0.44–1.00)
Calcium: 9.5 mg/dL (ref 8.9–10.3)
Glucose, Bld: 81 mg/dL (ref 65–99)
Potassium: 3.6 mmol/L (ref 3.5–5.1)
SODIUM: 138 mmol/L (ref 135–145)

## 2017-11-19 LAB — I-STAT TROPONIN, ED
Troponin i, poc: 0 ng/mL (ref 0.00–0.08)
Troponin i, poc: 0 ng/mL (ref 0.00–0.08)

## 2017-11-19 LAB — I-STAT BETA HCG BLOOD, ED (MC, WL, AP ONLY)

## 2017-11-19 LAB — D-DIMER, QUANTITATIVE: D-Dimer, Quant: <0.27 ug/mL-FEU (ref 0.00–0.50)

## 2017-11-19 MED ORDER — ONDANSETRON 4 MG PO TBDP
4.0000 mg | ORAL_TABLET | Freq: Once | ORAL | Status: AC
  Administered 2017-11-19: 4 mg via ORAL
  Filled 2017-11-19: qty 1

## 2017-11-19 MED ORDER — ONDANSETRON 4 MG PO TBDP: 4.0000 mg | ORAL_TABLET | Freq: Three times a day (TID) | ORAL | 0 refills | Status: DC | PRN

## 2017-11-19 NOTE — ED Triage Notes (Addendum)
Patient here from dr office with complaints of chest pain and SOB x3 weeks. Reports hx of multiple PEs. Patient on Eliquis.

## 2017-11-19 NOTE — Telephone Encounter (Addendum)
  Pt called that she was having chest pain like she had when she was dx with pulmonary embolus. She has a history of having PEs 3 times. She was put on the blood thinner Eliquis. She stated she had previously been on Lovenox and stopped the Eliquis and started using the Lovenox to see if that would help her pain in her chest. Pt advised against switching up on the blood thinners without medical supervision. Pt is having shortness of breath, cough, along with the chest pain, nausea and dizziness.  Pt advised to go to the ED to be triaged. Her boyfriend will drive her to [facility] emergency department.   Will notify flow at [facility] at [facility].   Pt called back to make sure that she was headed to the emergency department at [facility] and she was.  Reason for Disposition . Taking a deep breath makes pain worse  Answer Assessment - Initial Assessment Questions 1. LOCATION: "Where does it hurt?"       Left side of chest 2. RADIATION: "Does the pain go anywhere else?" (e.g., into neck, jaw, arms, back)     no 3. ONSET: "When did the chest pain begin?" (Minutes, hours or days)      3 weeks 4. PATTERN "Does the pain come and go, or has it been constant since it started?"  "Does it get worse with exertion?"      Comes and goes but today pretty constant 5. DURATION: "How long does it last" (e.g., seconds, minutes, hours)     hours 6. SEVERITY: "How bad is the pain?"  (e.g., Scale 1-10; mild, moderate, or severe)    - MILD (1-3): doesn't interfere with normal activities     - MODERATE (4-7): interferes with normal activities or awakens from sleep    - SEVERE (8-10): excruciating pain, unable to do any normal activities       Pain #7 7. CARDIAC RISK FACTORS: "Do you have any history of heart problems or risk factors for heart disease?" (e.g., prior heart attack, angina; high blood pressure, diabetes, being overweight, high cholesterol, smoking, or strong family history of heart disease)  Overweight, mom has a hole in her heart 8. PULMONARY RISK FACTORS: "Do you have any history of lung disease?"  (e.g., blood clots in lung, asthma, emphysema, birth control pills)     Blood clots in lungs, asthma 9. CAUSE: "What do you think is causing the chest pain?"     Blood clot 10. OTHER SYMPTOMS: "Do you have any other symptoms?" (e.g., dizziness, nausea, vomiting, sweating, fever, difficulty breathing, cough)       Difficulty breathing, nausea, dizziness  11. PREGNANCY: "Is there any chance you are pregnant?" "When was your last menstrual period?"       No LMP  April 7th  Protocols used: CHEST PAIN-A-AH

## 2017-11-19 NOTE — Discharge Instructions (Addendum)
Your labs, xray, and EKG are all reassuring, your chest pain could be a variety of things including gas pain, muscle pain, and other nonemergent issues. Alternate between tylenol and ibuprofen as directed as needed for pain. Stay well hydrated. You may consider using heat to the areas of pain, no more than 20 minutes every hour. You also may consider using over the counter tums/maalox/zantac as needed for indigestion, and avoid spicy/fatty/fried/acidic foods. Elevate your legs and use compression stockings and eat a low salt/low sodium diet to help with leg swelling. Use zofran as directed as needed for nausea. Follow up with your regular doctor in 3-5 days for recheck of symptoms. Return to the ER for changes or worsening symptoms.  SEEK IMMEDIATE MEDICAL ATTENTION IF: You develop a fever.  Your chest pains become severe or intolerable.  You develop new, unexplained symptoms (problems).  You develop shortness of breath, nausea, vomiting, sweating or feel light headed.  You develop a new cough or you cough up blood. You develop new leg swelling

## 2017-11-19 NOTE — ED Provider Notes (Signed)
[facility]-EMERGENCY DEPT Provider Note   CSN: [id number] Arrival date & time: 11/19/17  1458     History   Chief Complaint Chief Complaint  Patient presents with  . Chest Pain  . Shortness of Breath    HPI Hayley Webb is a 32 y.o. female with a PMHx of asthma, lupus, PE on eliquis, and other conditions listed below, who presents to the ED with complaints of intermittent left-sided chest pain for the last 3 weeks and shortness of breath since yesterday.  She describes the pain as 9/10 intermittent sharp and squeezing nonradiating left-sided chest pain that worsens with breathing or walking and with no treatments tried prior to arrival and no known alleviating factors.  She states that since yesterday she is also felt short of breath.  She gets intermittently lightheaded when the chest pain happens.  She also mentions having nausea, had some vomiting last week but none in the last 1 week however continues to have nausea.  Lastly she mentions having some bilateral foot and ankle swelling when she is on her feet a lot, which is a somewhat chronic issue however has been more persistent over the last several weeks.  She denies any diaphoresis, fevers, chills, cough, wheezing, calf/leg swelling, prolonged travel, recent surgery/immobilization, estrogen use, abdominal pain, ongoing vomiting, diarrhea, constipation, dysuria, hematuria, myalgias, arthralgias, numbness, tingling, focal weakness, claudication, orthopnea, or any other complaints at this time.  She is a non-smoker.  No known family history of cardiac disease.  She is compliant with her eliquis.   The history is provided by the patient and medical records. No language interpreter was used.  Chest Pain   Associated symptoms include nausea and shortness of breath. Pertinent negatives include no abdominal pain, no cough, no diaphoresis, no fever, no numbness, no vomiting and no weakness.  Shortness of Breath  Associated  symptoms include chest pain and leg swelling (b/l feet/ankles). Pertinent negatives include no fever, no cough, no wheezing, no vomiting and no abdominal pain.    Past Medical History:  Diagnosis Date  . Anxiety    doing good now  . Asthma   . Headache(784.0)   . HSV infection   . Infection    UTI  . Lupus (HCC)    dx age 53  . Pulmonary embolism (HCC) 06/4/82011  . 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 Chest pain 04/04/2017  . On continuous oral anticoagulation 04/04/2017  . 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 Nonintractable headache 04/04/2017  . Altered thought processes 04/04/2017  . Word finding difficulty 04/04/2017  . Antiphospholipid antibody syndrome (HCC) 02/10/2017  . Pulmonary embolus (HCC) 02/09/2017  . Pulmonary embolism (HCC) 12/26/2016  . Shortness of breath   . Vaginal delivery--VE assist 10/25/2013  . 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 Obesity-BMI 47 05/09/2013  . Hirsutism 10/02/2012  . STD (female) 01/18/2012  . Lupus (HCC)   . Asthma   . HSV infection     Past Surgical History:  Procedure Laterality Date  . MOUTH SURGERY     2 teeth removed- 1 wisdom and 1 in front of it  . TONSILLECTOMY AND ADENOIDECTOMY  1998  . WISDOM TOOTH EXTRACTION       OB History    Gravida  2   Para  1   Term  1  Preterm      AB  1   Living  1     SAB  1   TAB      Ectopic      Multiple      Live Births  1            Home Medications    Prior to Admission medications   Medication Sig Start Date End Date Taking? Authorizing Provider  acetaminophen (TYLENOL) 500 MG tablet Take 1,000 mg by mouth every 6 (six) hours as needed for headache (pain).     [provider]  albuterol (PROVENTIL HFA;VENTOLIN HFA) 108 (90 Base) MCG/ACT inhaler Inhale 2 puffs into the lungs every 6  (six) hours as needed for wheezing or shortness of breath.    [provider]  albuterol (PROVENTIL) (2.5 MG/3ML) 0.083% nebulizer solution Take 2.5 mg by nebulization every 6 (six) hours as needed for wheezing or shortness of breath.    [provider]  apixaban (ELIQUIS) 5 MG TABS tablet Take 1 tablet (5 mg total) by mouth 2 (two) times daily. 04/27/17   Serena Croissant, MD  hydroxychloroquine (PLAQUENIL) 200 MG tablet Take 200 mg by mouth daily.    [provider]  meloxicam (MOBIC) 7.5 MG tablet Take 1 tablet (7.5 mg total) by mouth 2 (two) times daily. Patient taking differently: Take 7.5 mg by mouth daily as needed for pain.  02/19/17   Calvert Cantor, MD  methylPREDNISolone (MEDROL) 4 MG tablet Take 4 mg by mouth daily.    [provider]  promethazine (PHENERGAN) 6.25 MG/5ML syrup Take 6.25 mg by mouth every 6 (six) hours as needed for nausea or vomiting.    [provider]  traMADol (ULTRAM) 50 MG tablet Take 1 tablet (50 mg total) by mouth every 6 (six) hours as needed. Patient taking differently: Take 50 mg by mouth every 6 (six) hours as needed for moderate pain or severe pain.  07/03/17   Jacalyn Lefevre, MD    Family History Family History  Problem Relation Age of Onset  . Diabetes Maternal Aunt   . Cancer Maternal Grandmother 72       COLON CA  . Diabetes Maternal Grandmother   . Hypertension Maternal Grandfather   . Cancer Maternal Grandfather        prostate  . Asthma Mother   . Asthma Brother   . Cancer Paternal Grandmother        breast  . Lupus Paternal Grandmother   . Anesthesia problems Neg Hx   . Hypotension Neg Hx   . Malignant hyperthermia Neg Hx   . Pseudochol deficiency Neg Hx     Social History Social History   Tobacco Use  . Smoking status: Never Smoker  . Smokeless tobacco: Never Used  Substance Use Topics  . Alcohol use: No  . Drug use: No     Allergies   Bactrim; Prednisone; Nsaids; Other; Pineapple;  Procardia [nifedipine]; and Vicodin [hydrocodone-acetaminophen]   Review of Systems Review of Systems  Constitutional: Negative for chills, diaphoresis and fever.  Respiratory: Positive for shortness of breath. Negative for cough and wheezing.   Cardiovascular: Positive for chest pain and leg swelling (b/l feet/ankles).  Gastrointestinal: Positive for nausea. Negative for abdominal pain, constipation, diarrhea and vomiting.  Genitourinary: Negative for dysuria and hematuria.  Musculoskeletal: Negative for arthralgias and myalgias.  Skin: Negative for color change.  Allergic/Immunologic: Negative for immunocompromised state.  Neurological: Positive for light-headedness (intermittent). Negative for weakness and  numbness.  Hematological: Bruises/bleeds easily (on eliquis).  Psychiatric/Behavioral: Negative for confusion.   All other systems reviewed and are negative for acute change except as noted in the HPI.    Physical Exam Updated Vital Signs BP (!) 148/109 (BP Location: Right Arm)   Pulse 83   Temp 98.9 F (37.2 C) (Oral)   Resp 18   Ht 5' 8.5" (1.74 m)   Wt (!) 155.6 kg (343 lb)   LMP 10/28/2017   SpO2 99%   BMI 51.39 kg/m   Physical Exam  Constitutional: She is oriented to person, place, and time. Vital signs are normal. She appears well-developed and well-nourished.  Non-toxic appearance. No distress.  Afebrile, nontoxic, NAD  HENT:  Head: Normocephalic and atraumatic.  Mouth/Throat: Oropharynx is clear and moist and mucous membranes are normal.  Eyes: Conjunctivae and EOM are normal. Right eye exhibits no discharge. Left eye exhibits no discharge.  Neck: Normal range of motion. Neck supple. No JVD present.  No appreciable JVD  Cardiovascular: Normal rate, regular rhythm, normal heart sounds and intact distal pulses. Exam reveals no gallop and no friction rub.  No murmur heard. RRR, nl s1/s2, no m/r/g, distal pulses intact, no pedal edema   Pulmonary/Chest: Effort  normal and breath sounds normal. No respiratory distress. She has no decreased breath sounds. She has no wheezes. She has no rhonchi. She has no rales. She exhibits tenderness. She exhibits no crepitus, no deformity and no retraction.  CTAB in all lung fields, no w/r/r, no hypoxia or increased WOB, speaking in full sentences, SpO2 99% on RA Chest wall with mild TTP over L anterior chest, without crepitus, deformities, or retractions     Abdominal: Soft. Normal appearance and bowel sounds are normal. She exhibits no distension. There is no tenderness. There is no rigidity, no rebound, no guarding, no CVA tenderness, no tenderness at McBurney's point and negative Murphy's sign.  Soft, obese but nondistended, nonTTP, +BS throughout, no r/g/r, neg murphy's, neg mcburney's, no CVA TTP   Musculoskeletal: Normal range of motion.  MAE x4 Strength and sensation grossly intact in all extremities Distal pulses intact No pedal edema, neg homan's bilaterally   Neurological: She is alert and oriented to person, place, and time. She has normal strength. No sensory deficit.  Skin: Skin is warm, dry and intact. No rash noted.  Psychiatric: She has a normal mood and affect.  Nursing note and vitals reviewed.    ED Treatments / Results  Labs (all labs ordered are listed, but only abnormal results are displayed) Labs Reviewed  BASIC METABOLIC PANEL  CBC  D-DIMER, QUANTITATIVE (NOT AT [facility])  I-STAT TROPONIN, ED  I-STAT BETA HCG BLOOD, ED ([facility], [facility], [facility] ONLY)  I-STAT TROPONIN, ED    EKG EKG Interpretation  Date/Time:  Monday November 19 2017 15:08:25 EDT Ventricular Rate:  87 PR Interval:  138 QRS Duration: 98 QT Interval:  372 QTC Calculation: 447 R Axis:   33 Text Interpretation:  Normal sinus rhythm Normal ECG No significant change since last tracing Confirmed by Shaune Pollack [phone]) on 11/19/2017 8:56:45 PM   Radiology Dg Chest 2 View  Result Date: 11/19/2017 CLINICAL DATA:  Chest pain,  shortness of breath, dizziness EXAM: CHEST - 2 VIEW COMPARISON:  09/18/2017 FINDINGS: Lungs are clear.  No pleural effusion or pneumothorax. The heart is normal in size. Visualized osseous structures are within normal limits. IMPRESSION: Normal chest radiographs. Electronically Signed   By: Charline Bills M.D.   On: 11/19/2017 16:53  Procedures Procedures (including critical care time)  Medications Ordered in ED Medications  ondansetron (ZOFRAN-ODT) disintegrating tablet 4 mg (4 mg Oral Given 11/19/17 2136)     Initial Impression / Assessment and Plan / ED Course  I have reviewed the triage vital signs and the nursing notes.  Pertinent labs & imaging results that were available during my care of the patient were reviewed by me and considered in my medical decision making (see chart for details).     32 y.o. female here with 3wks of intermittent CP and 1 day of SOB. Also reports some nausea, no vomiting since last week. Mentions some foot/ankle swelling when she's on her feet a lot, somewhat chronic. On exam, reproducible chest wall TTP over the L anterior chest, clear lungs, no tachycardia or hypoxia, no pedal edema, neg homan's sign bilaterally, no appreciable JVD. Work up thus far reveals: trop neg; betaHCG neg; BMP WNL; CBC WNL; D-dimer negative; CXR neg; EKG nonischemic and unremarkable. Will get second troponin now, but doubt need for further work up beyond that; doubt CHF, PE, dissection, ACS, etc. Likely musculoskeletal given reproducibility of pain. Will give zofran for nausea and reassess after second troponin is done.   9:43 PM Second trop negative. Overall, symptoms could be multifactorial, including gas/indigestion pain vs musculoskeletal pain, etc. Given reassuring work up today and low HEART score, doubt need for further emergent work up. Will rx zofran, advised other OTC remedies for symptomatic relief. For her perceived swelling in her feet/ankles, advised compression  stockings and elevation and sodium/salt restriction. Discussed f/up with PCP in 3-5 days for recheck of symptoms. I explained the diagnosis and have given explicit precautions to return to the ER including for any other new or worsening symptoms. The patient understands and accepts the medical plan as it's been dictated and I have answered their questions. Discharge instructions concerning home care and prescriptions have been given. The patient is STABLE and is discharged to home in good condition.    Final Clinical Impressions(s) / ED Diagnoses   Final diagnoses:  Left-sided chest pain  SOB (shortness of breath)  Nausea  Peripheral edema    ED Discharge Orders        Ordered    ondansetron (ZOFRAN ODT) 4 MG disintegrating tablet  Every 8 hours PRN     11/19/17 [address], [place], [place] 11/19/17 2143    Shaune Pollack, MD 11/20/17 [phone]

## 2017-11-19 NOTE — Telephone Encounter (Signed)
[facility] triage nurse called to say that pt was advised to go to ER

## 2017-11-19 NOTE — Telephone Encounter (Signed)
Copied from CRM [id number]. Topic: Quick Communication - See Telephone Encounter >> Nov 19, 2017  1:46 PM Lorrine Kin, NT wrote: CRM for notification. See Telephone encounter for: 11/19/17. Patient calling and is wanting to know if FMLA paperwork can be filled out for her new job. States Judeth Cornfield English had completed the paper work for her old job, but would like new paperwork filled out for new job. CB#: [phone]

## 2017-11-21 ENCOUNTER — Encounter (HOSPITAL_BASED_OUTPATIENT_CLINIC_OR_DEPARTMENT_OTHER): Payer: Self-pay

## 2017-11-21 ENCOUNTER — Ambulatory Visit: Payer: Self-pay

## 2017-11-21 ENCOUNTER — Other Ambulatory Visit: Payer: Self-pay

## 2017-11-21 ENCOUNTER — Emergency Department (HOSPITAL_BASED_OUTPATIENT_CLINIC_OR_DEPARTMENT_OTHER): Payer: 59

## 2017-11-21 ENCOUNTER — Emergency Department (HOSPITAL_BASED_OUTPATIENT_CLINIC_OR_DEPARTMENT_OTHER)
Admission: EM | Admit: 2017-11-21 | Discharge: 2017-11-21 | Disposition: A | Discharge status: Home / Self Care | Payer: 59 | Attending: Emergency Medicine | Admitting: Emergency Medicine

## 2017-11-21 DIAGNOSIS — J45909 Unspecified asthma, uncomplicated: Secondary | ICD-10-CM | POA: Insufficient documentation

## 2017-11-21 DIAGNOSIS — Z86711 Personal history of pulmonary embolism: Secondary | ICD-10-CM | POA: Diagnosis not present

## 2017-11-21 DIAGNOSIS — R0602 Shortness of breath: Secondary | ICD-10-CM | POA: Diagnosis not present

## 2017-11-21 DIAGNOSIS — R0789 Other chest pain: Secondary | ICD-10-CM | POA: Diagnosis not present

## 2017-11-21 DIAGNOSIS — Z79899 Other long term (current) drug therapy: Secondary | ICD-10-CM | POA: Diagnosis not present

## 2017-11-21 DIAGNOSIS — Z7901 Long term (current) use of anticoagulants: Secondary | ICD-10-CM | POA: Diagnosis not present

## 2017-11-21 LAB — BASIC METABOLIC PANEL
ANION GAP: 8 (ref 5–15)
BUN: 12 mg/dL (ref 6–20)
CALCIUM: 9 mg/dL (ref 8.9–10.3)
CHLORIDE: 103 mmol/L (ref 101–111)
CO2: 23 mmol/L (ref 22–32)
Creatinine, Ser: 0.69 mg/dL (ref 0.44–1.00)
GFR calc non Af Amer: >60 mL/min (ref >60)
Glucose, Bld: 93 mg/dL (ref 65–99)
POTASSIUM: 3.7 mmol/L (ref 3.5–5.1)
Sodium: 134 mmol/L — ABNORMAL LOW (ref 135–145)

## 2017-11-21 LAB — CBC WITH DIFFERENTIAL/PLATELET
BASOS ABS: 0 10*3/uL (ref 0.0–0.1)
Basophils Relative: 0 %
Eosinophils Absolute: 0.2 10*3/uL (ref 0.0–0.7)
Eosinophils Relative: 5 %
HEMATOCRIT: 36.9 % (ref 36.0–46.0)
HEMOGLOBIN: 12.5 g/dL (ref 12.0–15.0)
LYMPHS PCT: 39 %
Lymphs Abs: 2 10*3/uL (ref 0.7–4.0)
MCH: 27.2 pg (ref 26.0–34.0)
MCHC: 33.9 g/dL (ref 30.0–36.0)
MCV: 80.2 fL (ref 78.0–100.0)
MONOS PCT: 6 %
Monocytes Absolute: 0.3 10*3/uL (ref 0.1–1.0)
NEUTROS ABS: 2.6 10*3/uL (ref 1.7–7.7)
NEUTROS PCT: 50 %
Platelets: 351 10*3/uL (ref 150–400)
RBC: 4.6 MIL/uL (ref 3.87–5.11)
RDW: 14.4 % (ref 11.5–15.5)
WBC: 5.2 10*3/uL (ref 4.0–10.5)

## 2017-11-21 LAB — HCG, QUANTITATIVE, PREGNANCY: hCG, Beta Chain, Quant, S: <1 m[IU]/mL (ref <5)

## 2017-11-21 LAB — D-DIMER, QUANTITATIVE: D-Dimer, Quant: 0.41 ug/mL-FEU (ref 0.00–0.50)

## 2017-11-21 LAB — TROPONIN I: Troponin I: <0.03 ng/mL (ref <0.03)

## 2017-11-21 NOTE — Telephone Encounter (Signed)
Phone call from pt. with c/o left sided chest pain.  Described as "squeezing and pressure" sensation.  Rated at 8/10. Denied radiation of pain. Reported the pain has been intermittent for about 3 weeks.  Stated since Monday, it has become constant.  Also reported shortness of breath has increased; now constant, and worse with activity.  C/o pain with deep breath.  C/o intermittent blurring of vision.  Reported intermittent nausea and vomiting since chest pain started about 3 weeks ago.  Reported hx of PE; ran out of Eliquis for about 2 weeks. Has traveled twice in April in car for 4-5 hrs. Each trip.  Voiced concern of PE recurring.  Recommended pt. To go to the ER at this time.  Advised to have another adult drive her.  Verb. Understanding; agreed with plan.       Reason for Disposition . SEVERE chest pain  Answer Assessment - Initial Assessment Questions 1. LOCATION: "Where does it hurt?"       Left ant. chest pain  2. RADIATION: "Does the pain go anywhere else?" (e.g., into neck, jaw, arms, back)     Denied any radiation 3. ONSET: "When did the chest pain begin?" (Minutes, hours or days)      3 weeks ago; had been intermittent; now constant since Monday, 4/29 4. PATTERN "Does the pain come and go, or has it been constant since it started?"  "Does it get worse with exertion?"      See above  5. DURATION: "How long does it last" (e.g., seconds, minutes, hours)     Constant  6. SEVERITY: "How bad is the pain?"  (e.g., Scale 1-10; mild, moderate, or severe)    - MILD (1-3): doesn't interfere with normal activities     - MODERATE (4-7): interferes with normal activities or awakens from sleep    - SEVERE (8-10): excruciating pain, unable to do any normal activities       8/10; "squeezing and pressure"  7. CARDIAC RISK FACTORS: "Do you have any history of heart problems or risk factors for heart disease?" (e.g., prior heart attack, angina; high blood pressure, diabetes, being overweight, high  cholesterol, smoking, or strong family history of heart disease)     Denied cardiac hx. 8. PULMONARY RISK FACTORS: "Do you have any history of lung disease?"  (e.g., blood clots in lung, asthma, emphysema, birth control pills)     Hx of PE; was out of blood thinner, Eliquis x 2 weeks  9. CAUSE: "What do you think is causing the chest pain?"     Concerned about PE 10. OTHER SYMPTOMS: "Do you have any other symptoms?" (e.g., dizziness, nausea, vomiting, sweating, fever, difficulty breathing, cough)       Short of breath constantly and worse with activity; dizzy, blurring of vision since Mon., nausea and vomiting intermittently since chest pain started 3 weeks ago.   11. PREGNANCY: "Is there any chance you are pregnant?" "When was your last menstrual period?"       No; LMP 4/2  Protocols used: CHEST PAIN-A-AH

## 2017-11-21 NOTE — ED Notes (Signed)
NAD at this time. Pt is stable and going home.  

## 2017-11-21 NOTE — ED Provider Notes (Signed)
[facility] EMERGENCY DEPARTMENT Provider Note   CSN: [id number] Arrival date & time: 11/21/17  1127     History   Chief Complaint Chief Complaint  Patient presents with  . Chest Pain    HPI Hayley Webb is a 32 y.o. female for the past medical history of asthma, lupus, PE on Eliquis who presents the emergency department today for complaints of intermittent left-sided chest pain as well as shortness of breath for the last 3 weeks.  Patient describes her pain as a intermittent sharp, squeezing pain in the left upper chest that does not radiate and is associated with shortness of breath.  She notes that this pain is worsened with breathing.  It is not worsened by position or exertion she denies any aggravating or relieving factors related to this.  No nausea, emesis or diaphoresis associated with this.  She notes that the pain is typically brought on at rest and not with exertion.  She denies history of similar pain.  She reports that she had a reassuring work-up on 4/29 but has had continued pain.  She has been taking Mobic for symptoms with mild relief.  She is concern for PE given the fact that she was out of her Eliquis medication for 2 weeks and took too long car trips or greater than 6 hours in the last 1 month.  Patient dates she is unsure what caused her prior PEs and states that they think this may be related to her lupus.  She denies these being related to preceding surgery, immobilization, estrogen use etc.  Patient denies any fever, chills, headache, cough, hemoptysis, estrogen use, abdominal pain, diarrhea, numbness/tingling, focal weakness, orthopnea, lower leg swelling, recent surgery.  Patient is a never smoker.  She denies any family history of early CAD.  Since last echocardiogram on 02/12/2017 that showed EF of 65-60% with normal wall motions.  HPI  Past Medical History:  Diagnosis Date  . Anxiety    doing good now  . Asthma   . Headache(784.0)   . HSV infection     . Infection    UTI  . Lupus (HCC)    dx age 74  . Pulmonary embolism (HCC) 06/4/82011  . 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 Chest pain 04/04/2017  . On continuous oral anticoagulation 04/04/2017  . 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 Nonintractable headache 04/04/2017  . Altered thought processes 04/04/2017  . Word finding difficulty 04/04/2017  . Antiphospholipid antibody syndrome (HCC) 02/10/2017  . Pulmonary embolus (HCC) 02/09/2017  . Pulmonary embolism (HCC) 12/26/2016  . Shortness of breath   . Vaginal delivery--VE assist 10/25/2013  . 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 Obesity-BMI 47 05/09/2013  . Hirsutism 10/02/2012  . STD (female) 01/18/2012  . Lupus (HCC)   . Asthma   . HSV infection     Past Surgical History:  Procedure Laterality Date  . MOUTH SURGERY     2 teeth removed- 1 wisdom and 1 in front of it  . TONSILLECTOMY AND ADENOIDECTOMY  1998  . WISDOM TOOTH EXTRACTION       OB History    Gravida  2   Para  1   Term  1   Preterm      AB  1   Living  1     SAB  1   TAB      Ectopic      Multiple      Live Births  1            Home Medications    Prior to Admission medications   Medication Sig Start Date End Date Taking? Authorizing Provider  acetaminophen (TYLENOL) 500 MG tablet Take 1,000 mg by mouth every 6 (six) hours as needed for headache (pain).     [provider]  albuterol (PROVENTIL HFA;VENTOLIN HFA) 108 (90 Base) MCG/ACT inhaler Inhale 2 puffs into the lungs every 6 (six) hours as needed for wheezing or shortness of breath.    [provider]  albuterol (PROVENTIL) (2.5 MG/3ML) 0.083% nebulizer solution Take 2.5 mg by nebulization every 6 (six) hours as needed for wheezing or shortness of breath.    [provider]  apixaban  (ELIQUIS) 5 MG TABS tablet Take 1 tablet (5 mg total) by mouth 2 (two) times daily. 04/27/17   Serena Croissant, MD  hydroxychloroquine (PLAQUENIL) 200 MG tablet Take 200 mg by mouth daily.    [provider]  meloxicam (MOBIC) 7.5 MG tablet Take 1 tablet (7.5 mg total) by mouth 2 (two) times daily. Patient taking differently: Take 7.5 mg by mouth daily as needed for pain.  02/19/17   Calvert Cantor, MD  methylPREDNISolone (MEDROL) 4 MG tablet Take 4 mg by mouth daily.    [provider]  ondansetron (ZOFRAN ODT) 4 MG disintegrating tablet Take 1 tablet (4 mg total) by mouth every 8 (eight) hours as needed for nausea or vomiting. 11/19/17   Street, [place], PA-C  promethazine (PHENERGAN) 6.25 MG/5ML syrup Take 6.25 mg by mouth every 6 (six) hours as needed for nausea or vomiting.    [provider]  traMADol (ULTRAM) 50 MG tablet Take 1 tablet (50 mg total) by mouth every 6 (six) hours as needed. Patient taking differently: Take 50 mg by mouth every 6 (six) hours as needed for moderate pain or severe pain.  07/03/17   Jacalyn Lefevre, MD    Family History Family History  Problem Relation Age of Onset  . Diabetes Maternal Aunt   . Cancer Maternal Grandmother 72       COLON CA  . Diabetes Maternal Grandmother   . Hypertension Maternal Grandfather   . Cancer Maternal Grandfather        prostate  . Asthma Mother   . Asthma Brother   . Cancer Paternal Grandmother        breast  . Lupus Paternal Grandmother   . Anesthesia problems Neg Hx   . Hypotension Neg Hx   . Malignant hyperthermia Neg Hx   . Pseudochol deficiency Neg Hx     Social History Social History   Tobacco Use  . Smoking status: Never Smoker  . Smokeless tobacco: Never Used  Substance Use Topics  . Alcohol use: No  . Drug use: No     Allergies   Bactrim; Prednisone; Nsaids; Other; Pineapple; Procardia [nifedipine]; and Vicodin [hydrocodone-acetaminophen]   Review of Systems Review of  Systems  All other systems reviewed and are negative.    Physical Exam Updated Vital Signs BP (!) 139/97 (BP Location: Left Arm)   Pulse 95   Temp 98.1 F (36.7 C) (Oral)   Resp 20   Ht  (1.727 m)   Wt (!) 157.6 kg (347 lb 7.1 oz)   LMP 10/28/2017   SpO2 98%   BMI 52.83 kg/m  Physical Exam  Constitutional: She appears well-developed and well-nourished.  HENT:  Head: Normocephalic and atraumatic.  Right Ear: External ear normal.  Left Ear: External ear normal.  Nose: Nose normal.  Mouth/Throat: Uvula is midline, oropharynx is clear and moist and mucous membranes are normal. No tonsillar exudate.  Eyes: Pupils are equal, round, and reactive to light. Right eye exhibits no discharge. Left eye exhibits no discharge. No scleral icterus.  Neck: Trachea normal. Neck supple. No JVD present. No spinous process tenderness present. Carotid bruit is not present. No neck rigidity. Normal range of motion present.  No JVD or carotid bruit  Cardiovascular: Normal rate, regular rhythm and intact distal pulses.  No murmur heard. Pulses:      Radial pulses are 2+ on the right side, and 2+ on the left side.       Dorsalis pedis pulses are 2+ on the right side, and 2+ on the left side.       Posterior tibial pulses are 2+ on the right side, and 2+ on the left side.  No lower extremity swelling or edema. Calves symmetric in size bilaterally. Negative homan's b/l.   Pulmonary/Chest: Effort normal and breath sounds normal. She exhibits no tenderness.  Patient satting at 98% on room air. No increased work of breathing. No accessory muscle use. Patient is sitting upright, speaking in full sentences without difficulty   Abdominal: Soft. Bowel sounds are normal. She exhibits no distension. There is no tenderness. There is no rigidity, no rebound, no guarding and no CVA tenderness.  Musculoskeletal: She exhibits no edema.  Lymphadenopathy:    She has no cervical adenopathy.  Neurological: She is  alert.  Speech clear. Follows commands. No facial droop. PERRLA. EOM grossly intact. CN III-XII grossly intact. Grossly moves all extremities 4 without ataxia. Able and appropriate strength for age to upper and lower extremities bilaterally.   Skin: Skin is warm and dry. No rash noted. She is not diaphoretic.  Psychiatric: She has a normal mood and affect.  Nursing note and vitals reviewed.    ED Treatments / Results  Labs (all labs ordered are listed, but only abnormal results are displayed) Labs Reviewed  BASIC METABOLIC PANEL - Abnormal; Notable for the following components:      Result Value   Sodium 134 (*)    All other components within normal limits  TROPONIN I  CBC WITH DIFFERENTIAL/PLATELET  D-DIMER, QUANTITATIVE (NOT AT [facility])  HCG, QUANTITATIVE, PREGNANCY    EKG EKG Interpretation  Date/Time:  Wednesday Nov 21 2017 11:38:08 EDT Ventricular Rate:  90 PR Interval:    QRS Duration: 96 QT Interval:  380 QTC Calculation: 465 R Axis:   15 Text Interpretation:  Sinus rhythm Confirmed by Jacalyn Lefevre [id number] on 11/21/2017 11:40:05 AM   Radiology Dg Chest 2 View  Result Date: 11/19/2017 CLINICAL DATA:  Chest pain, shortness of breath, dizziness EXAM: CHEST - 2 VIEW COMPARISON:  09/18/2017 FINDINGS: Lungs are clear.  No pleural effusion or pneumothorax. The heart is normal in size. Visualized osseous structures are within normal limits. IMPRESSION: Normal chest radiographs. Electronically Signed   By: Charline Bills M.D.   On: 11/19/2017 16:53    Procedures Procedures (including critical care time)  Medications Ordered in ED Medications - No data to display   Initial Impression / Assessment and Plan / ED Course  I have reviewed the triage vital signs and the nursing notes.  Pertinent labs & imaging results that were available during my care  of the patient were reviewed by me and considered in my medical decision making (see chart for details).     32 y.o.  Female with a history of PE who presents the emergency department today for intermittent left-sided chest pain as well as shortness of breath over the last 3 weeks.  Patient notes that she had episode where she was not taking her Eliquis for 2 weeks and had a long car trip approximately 1 month ago.  Pain is not exertional in nature.  Vital signs are stable.  Patient is to be discharged with recommendation to follow up with PCP in regards to today's hospital visit. Chest pain is not likely of cardiac or pulmonary etiology due to presentation, d-dimer negative, stable vital signs, no tracheal deviation, no JVD or new murmur, RRR, breath sounds equal bilaterally, EKG without acute abnormalities, negative troponin, and negative CXR.  Patient also received a reassuring work-up on 4/29.  HEART score places the pateint at low risk.  Do not feel the patient needs further evaluation for PE at this time given the fact that she has had two D-dimers in the last 48 hours that are negative and patient is already on anticoagulation medicine.  She is not hypoxic or tachycardic in the department.  Her EKG does not show evidence of right heart strain.  Patient has had several CT scans in the past.  We discussed risk versus benefits of this.  She agrees not receiving CT at this time.  Patient has been advised to return to the ED if chest pain becomes exertional, associated with diaphoresis or nausea, radiates to left jaw/arm, worsens or becomes concerning in any way. Patient appears reliable for follow up and is agreeable to discharge. I advised the patient to follow-up with their primary care provider this week.  Recommended discussing with the patient's primary care provider about referral to a cardiologist for further evaluation. I advised the patient to return to the emergency department with new or worsening symptoms or new concerns. The patient verbalized understanding and agreement with plan. Patient stable for  discharge  Final Clinical Impressions(s) / ED Diagnoses   Final diagnoses:  Atypical chest pain  Shortness of breath    ED Discharge Orders    None       Princella Pellegrini 11/21/17 2021    Jacalyn Lefevre, MD 11/22/17 [phone]

## 2017-11-21 NOTE — Discharge Instructions (Addendum)
Read instructions below for reasons to return to the Emergency Department. It is recommended that your follow up with your Primary Care Doctor in regards to today's visit. If you do not have a doctor, use the resource guide listed below to help you find one. Continue taking your home medications as prescribed.   Tests performed today include: An EKG of your heart A chest x-ray Cardiac enzymes - a blood test for heart muscle damage Blood counts and electrolytes D-Dimer - this was negative.  Vital signs. See below for your results today.   Follow up with your PCP this week. Discuss referral to cardiology if your symptoms continue.   Chest Pain (Nonspecific)  HOME CARE INSTRUCTIONS  For the next few days, avoid physical activities that bring on chest pain. Continue physical activities as directed.  Do not smoke cigarettes or drink alcohol until your symptoms are gone. If you do smoke, it is time to quit. You may receive instructions and counseling on how to stop smoking. Only take over-the-counter or prescription medicine for pain, discomfort, or fever as directed by your caregiver.  Follow your caregiver's suggestions for further testing if your chest pain does not go away.  Keep any follow-up appointments you made. If you do not go to an appointment, you could develop lasting (chronic) problems with pain. If there is any problem keeping an appointment, you must call to reschedule.  SEEK MEDICAL CARE IF:  You think you are having problems from the medicine you are taking. Read your medicine instructions carefully.  Your chest pain does not go away, even after treatment.  You develop a rash with blisters on your chest.  SEEK IMMEDIATE MEDICAL CARE IF:  You have increased chest pain or pain that spreads to your arm, neck, jaw, back, or belly (abdomen).  You develop shortness of breath, an increasing cough, or you are coughing up blood.  You have severe back or abdominal pain, feel sick to your  stomach (nauseous) or throw up (vomit).  You develop severe weakness, fainting, or chills.  You have an oral temperature above 102 F (38.9 C), not controlled by medicine.  THIS IS AN EMERGENCY. Do not wait to see if the pain will go away. Get medical help at once. Call your local emergency services (911 in [place] Do not drive yourself to the hospital. Additional Information:  Your vital signs today were: BP (!) 129/94 (BP Location: Left Arm)    Pulse 80    Temp 98.1 F (36.7 C) (Oral)    Resp 17    Ht  (1.727 m)    Wt (!) 157.6 kg (347 lb 7.1 oz)    LMP 10/28/2017    SpO2 100%    BMI 52.83 kg/m  If your blood pressure (BP) was elevated above 135/85 this visit, please have this repeated by your doctor within one month. ---------------

## 2017-11-21 NOTE — ED Triage Notes (Signed)
Pt c/o left side CP x 3 weeks-states feels same as when she had PE-has been off eloquis x 2 weeks and recent travel-NAD-steady gait

## 2017-11-26 ENCOUNTER — Telehealth: Payer: Self-pay | Admitting: Physician Assistant

## 2017-11-26 ENCOUNTER — Ambulatory Visit (INDEPENDENT_AMBULATORY_CARE_PROVIDER_SITE_OTHER): Payer: 59 | Admitting: Family Medicine

## 2017-11-26 ENCOUNTER — Other Ambulatory Visit: Payer: Self-pay

## 2017-11-26 ENCOUNTER — Ambulatory Visit: Payer: Self-pay

## 2017-11-26 ENCOUNTER — Encounter: Payer: Self-pay | Admitting: Family Medicine

## 2017-11-26 VITALS — BP 166/112 | HR 98 | Temp 99.0°F | Resp 17 | Ht 68.0 in | Wt 347.2 lb

## 2017-11-26 DIAGNOSIS — D6861 Antiphospholipid syndrome: Secondary | ICD-10-CM | POA: Diagnosis not present

## 2017-11-26 DIAGNOSIS — Z7901 Long term (current) use of anticoagulants: Secondary | ICD-10-CM | POA: Diagnosis not present

## 2017-11-26 DIAGNOSIS — I2699 Other pulmonary embolism without acute cor pulmonale: Secondary | ICD-10-CM | POA: Diagnosis not present

## 2017-11-26 DIAGNOSIS — R0789 Other chest pain: Secondary | ICD-10-CM | POA: Diagnosis not present

## 2017-11-26 NOTE — Telephone Encounter (Signed)
See triage encounter.

## 2017-11-26 NOTE — Patient Instructions (Addendum)
   IF you received an x-ray today, you will receive an invoice from [facility]. Please contact [facility] at [phone] with questions or concerns regarding your invoice.   IF you received labwork today, you will receive an invoice from [facility]. Please contact [facility] at [phone] with questions or concerns regarding your invoice.   Our billing staff will not be able to assist you with questions regarding bills from these companies.  You will be contacted with the lab results as soon as they are available. The fastest way to get your results is to activate your My Chart account. Instructions are located on the last page of this paperwork. If you have not heard from us regarding the results in 2 weeks, please contact this office.      Angina Pectoris Angina pectoris, often called angina, is extreme discomfort in the chest, neck, or arm. This is caused by a lack of blood in the middle and thickest layer of the heart wall (myocardium). There are four types of angina:  Stable angina. Stable angina usually occurs in episodes of predictable frequency and duration. It is usually brought on by physical activity, stress, or excitement. Stable angina usually lasts a few minutes and can often be relieved by a medicine that you place under your tongue. This medicine is called sublingual nitroglycerin.  Unstable angina. Unstable angina can occur even when you are doing little or no physical activity. It can even occur while you are sleeping or when you are at rest. It can suddenly increase in severity or frequency. It may not be relieved by sublingual nitroglycerin, and it can last up to 30 minutes.  Microvascular angina. This type of angina is caused by a disorder of tiny blood vessels called arterioles. Microvascular angina is more common in women. The pain may be more severe and last longer than other types of angina pectoris.  Prinzmetal or variant angina. This type of  angina pectoris is rare and usually occurs when you are doing little or no physical activity. It especially occurs in the early morning hours.  What are the causes? Atherosclerosis is the cause of angina. This is the buildup of fat and cholesterol (plaque) on the inside of the arteries. Over time, the plaque may narrow or block the artery, and this will lessen blood flow to the heart. Plaque can also become weak and break off within a coronary artery to form a clot and cause a sudden blockage. What increases the risk? Risk factors common to both men and women include:  High cholesterol levels.  High blood pressure (hypertension).  Tobacco use.  Diabetes.  Family history of angina.  Obesity.  Lack of exercise.  A diet high in saturated fats.  Women are at greater risk for angina if they are:  Over age 55.  Postmenopausal.  What are the signs or symptoms? Many people do not experience any symptoms during the early stages of angina. As the condition progresses, symptoms common to both men and women may include:  Chest pain. ? The pain can be described as a crushing or squeezing in the chest, or a tightness, pressure, fullness, or heaviness in the chest. ? The pain can last more than a few minutes, or it can stop and recur.  Pain in the arms, neck, jaw, or back.  Unexplained heartburn or indigestion.  Shortness of breath.  Nausea.  Sudden cold sweats.  Sudden light-headedness.  Many women have chest discomfort and some of the other symptoms.   However, women often have different (atypical) symptoms, such as:  Fatigue.  Unexplained feelings of nervousness or anxiety.  Unexplained weakness.  Dizziness or fainting.  Sometimes, women may have angina without any symptoms. How is this diagnosed? Tests to diagnose angina may include:  ECG (electrocardiogram).  Exercise stress test. This looks for signs of blockage when the heart is being exercised.  Pharmacologic  stress test. This test looks for signs of blockage when the heart is being stressed with a medicine.  Blood tests.  Coronary angiogram. This is a procedure to look at the coronary arteries to see if there is any blockage.  How is this treated? The treatment of angina may include the following:  Healthy behavioral changes to reduce or control risk factors.  Medicine.  Coronary stenting.A stent helps to keep an artery open.  Coronary angioplasty. This procedure widens a narrowed or blocked artery.  Coronary arterybypass surgery. This will allow your blood to pass the blockage (bypass) to reach your heart.  Follow these instructions at home:  Take medicines only as directed by your health care provider.  Do not take the following medicines unless your health care provider approves: ? Nonsteroidal anti-inflammatory drugs (NSAIDs), such as ibuprofen, naproxen, or celecoxib. ? Vitamin supplements that contain vitamin A, vitamin E, or both. ? Hormone replacement therapy that contains estrogen with or without progestin.  Manage other health conditions such as hypertension and diabetes as directed by your health care provider.  Follow a heart-healthy diet. A dietitian can help to educate you about healthy food options and changes.  Use healthy cooking methods such as roasting, grilling, broiling, baking, poaching, steaming, or stir-frying. Talk to a dietitian to learn more about healthy cooking methods.  Follow an exercise program approved by your health care provider.  Maintain a healthy weight. Lose weight as approved by your health care provider.  Plan rest periods when fatigued.  Learn to manage stress.  Do not use any tobacco products, including cigarettes, chewing tobacco, or electronic cigarettes. If you need help quitting, ask your health care provider.  If you drink alcohol, and your health care provider approves, limit your alcohol intake to no more than 1 drink per  day. One drink equals 12 ounces of beer, 5 ounces of wine, or 1 ounces of hard liquor.  Stop illegal drug use.  Keep all follow-up visits as directed by your health care provider. This is important. Get help right away if:  You have pain in your chest, neck, arm, jaw, stomach, or back that lasts more than a few minutes, is recurring, or is unrelieved by taking sublingualnitroglycerin.  You have profuse sweating without cause.  You have unexplained: ? Heartburn or indigestion. ? Shortness of breath or difficulty breathing. ? Nausea or vomiting. ? Fatigue. ? Feelings of nervousness or anxiety. ? Weakness. ? Diarrhea.  You have sudden light-headedness or dizziness.  You faint. These symptoms may represent a serious problem that is an emergency. Do not wait to see if the symptoms will go away. Get medical help right away. Call your local emergency services (911 in the [place] Do not drive yourself to the hospital. This information is not intended to replace advice given to you by your health care provider. Make sure you discuss any questions you have with your health care provider. Document Released: 07/10/2005 Document Revised: 12/22/2015 Document Reviewed: 11/11/2013 Elsevier Interactive Patient Education  2017 [employer]  

## 2017-11-26 NOTE — Telephone Encounter (Signed)
Copied from CRM [id number]. Topic: General - Other >> Nov 26, 2017  4:11 PM Lorrine Kin, NT wrote: Reason for CRM: Patient called and wanted to schedule a hospital f/u. Stated tat her chest pain has not gotten any better. While getting NT on line, patient hung up. Please advise.

## 2017-11-26 NOTE — Telephone Encounter (Signed)
Patient called in with c/o "chest pain" and hung up before call transferred to NT. Patient called back to discuss her chest pain. Patient says "this has been going on for 3 weeks. I've gone to the ED for this and was told because my D-Dimer was 0.41, I don't need a CT scan to check for a blood clot in my lung. I feel like this is what I have because I've had them before. I am on a blood thinner for that for the past year. I am short of breath, the pain is an 8 on the left side, not radiating. I've had nausea and vomiting during this 3 weeks. I just want to see the doctor, so they can order a CT scan to make sure there is no clot." According to protocol, see PCP within 24 hours, advised PCP is no longer with the practice and she will need to establish care with another provider at some point. Appointment scheduled for today at 1640 with Dr. Creta Levin, care advice given, patient verbalized understanding.   Reason for Disposition . [1] Chest pain lasts > 5 minutes AND [2] occurred > 3 days ago (72 hours) AND [3] NO chest pain or cardiac symptoms now  Answer Assessment - Initial Assessment Questions 1. LOCATION: "Where does it hurt?"       Left side 2. RADIATION: "Does the pain go anywhere else?" (e.g., into neck, jaw, arms, back)     No 3. ONSET: "When did the chest pain begin?" (Minutes, hours or days)      3 weeks ago 4. PATTERN "Does the pain come and go, or has it been constant since it started?"  "Does it get worse with exertion?"      Comes and goes, but for the most part constant; worse with exertion 5. DURATION: "How long does it last" (e.g., seconds, minutes, hours)     Constant 6. SEVERITY: "How bad is the pain?"  (e.g., Scale 1-10; mild, moderate, or severe)    - MILD (1-3): doesn't interfere with normal activities     - MODERATE (4-7): interferes with normal activities or awakens from sleep    - SEVERE (8-10): excruciating pain, unable to do any normal activities       8 7. CARDIAC  RISK FACTORS: "Do you have any history of heart problems or risk factors for heart disease?" (e.g., prior heart attack, angina; high blood pressure, diabetes, being overweight, high cholesterol, smoking, or strong family history of heart disease)     No 8. PULMONARY RISK FACTORS: "Do you have any history of lung disease?"  (e.g., blood clots in lung, asthma, emphysema, birth control pills)     Yes 9. CAUSE: "What do you think is causing the chest pain?"     I think it may be another blood clot in my lung 10. OTHER SYMPTOMS: "Do you have any other symptoms?" (e.g., dizziness, nausea, vomiting, sweating, fever, difficulty breathing, cough)       Shortness of breath, nausea sometimes, vomiting sometimes 11. PREGNANCY: "Is there any chance you are pregnant?" "When was your last menstrual period?"       No  Protocols used: CHEST PAIN-A-AH

## 2017-11-26 NOTE — Progress Notes (Addendum)
Chief Complaint  Patient presents with  . Chest Pain    x 3 weeks per [facility].  Per pt  chest pain intermittent episodes last 2 hours and stop then back 10-20 mins later, pain is sharp and constant.   Pain level 8/10, not taking any otc pain med for pain    HPI  Patient reports that she was seen in the ER for her intermittent chest pains  Pt reports that a week ago she developed shortness of breath She is currently on eliquis  She reports that her chest pain has been intermittent but lasts 2 hours then resolves She is also on plaquenil She states that she worries about another PE but with a normal d-dimer and troponin she is usually discharged from the ER She is here today because the ER told her to follow up with her PCP who has left the practice.   Wt Readings from Last 3 Encounters:  11/26/17 (!) 347 lb 3.2 oz (157.5 kg)  11/21/17 (!) 347 lb 7.1 oz (157.6 kg)  11/19/17 (!) 343 lb (155.6 kg)    Past Medical History:  Diagnosis Date  . Anxiety    doing good now  . Asthma   . Headache(784.0)   . HSV infection   . Infection    UTI  . Lupus (HCC)    dx age 89  . Pulmonary embolism (HCC) 06/4/82011  . STD (sexually transmitted disease) 02/2009   POSITIVE GC    Current Outpatient Medications  Medication Sig Dispense Refill  . acetaminophen (TYLENOL) 500 MG tablet Take 1,000 mg by mouth every 6 (six) hours as needed for headache (pain).     Marland Kitchen albuterol (PROVENTIL HFA;VENTOLIN HFA) 108 (90 Base) MCG/ACT inhaler Inhale 2 puffs into the lungs every 6 (six) hours as needed for wheezing or shortness of breath.    Marland Kitchen albuterol (PROVENTIL) (2.5 MG/3ML) 0.083% nebulizer solution Take 2.5 mg by nebulization every 6 (six) hours as needed for wheezing or shortness of breath.    Marland Kitchen apixaban (ELIQUIS) 5 MG TABS tablet Take 1 tablet (5 mg total) by mouth 2 (two) times daily. 60 tablet 11  . hydroxychloroquine (PLAQUENIL) 200 MG tablet Take 200 mg by mouth daily.    . meloxicam (MOBIC) 7.5 MG  tablet Take 1 tablet (7.5 mg total) by mouth 2 (two) times daily. (Patient taking differently: Take 7.5 mg by mouth daily as needed for pain. ) 60 tablet 0  . methylPREDNISolone (MEDROL) 4 MG tablet Take 4 mg by mouth daily.    . ondansetron (ZOFRAN ODT) 4 MG disintegrating tablet Take 1 tablet (4 mg total) by mouth every 8 (eight) hours as needed for nausea or vomiting. 15 tablet 0  . traMADol (ULTRAM) 50 MG tablet Take 1 tablet (50 mg total) by mouth every 6 (six) hours as needed. (Patient taking differently: Take 50 mg by mouth every 6 (six) hours as needed for moderate pain or severe pain. ) 15 tablet 0   No current facility-administered medications for this visit.     Allergies:  Allergies  Allergen Reactions  . Bactrim Anaphylaxis and Hives  . Prednisone Anaphylaxis and Hives    Has been on Dexamethasone without issue.  . Nsaids Other (See Comments)    Has Lupus, reccommended to avoid NSAIDs  . Other Itching    Shrimp:throat itching "can eat other shellfish"  . Pineapple Itching  . Procardia [Nifedipine] Swelling    Swelling of tongue  . Vicodin [Hydrocodone-Acetaminophen] Hives and Swelling  Can take Percocet without difficulty    Past Surgical History:  Procedure Laterality Date  . MOUTH SURGERY     2 teeth removed- 1 wisdom and 1 in front of it  . TONSILLECTOMY AND ADENOIDECTOMY  1998  . WISDOM TOOTH EXTRACTION      Social History   Socioeconomic History  . Marital status: Single    Spouse name: Not on file  . Number of children: Not on file  . Years of education: Not on file  . Highest education level: Not on file  Occupational History  . Not on file  Social Needs  . Financial resource strain: Not on file  . Food insecurity:    Worry: Not on file    Inability: Not on file  . Transportation needs:    Medical: Not on file    Non-medical: Not on file  Tobacco Use  . Smoking status: Never Smoker  . Smokeless tobacco: Never Used  Substance and Sexual  Activity  . Alcohol use: No  . Drug use: No  . Sexual activity: Yes    Partners: Male    Birth control/protection: None  Lifestyle  . Physical activity:    Days per week: Not on file    Minutes per session: Not on file  . Stress: Not on file  Relationships  . Social connections:    Talks on phone: Not on file    Gets together: Not on file    Attends religious service: Not on file    Active member of club or organization: Not on file    Attends meetings of clubs or organizations: Not on file    Relationship status: Not on file  Other Topics Concern  . Not on file  Social History Narrative  . Not on file    Family History  Problem Relation Age of Onset  . Diabetes Maternal Aunt   . Cancer Maternal Grandmother 72       COLON CA  . Diabetes Maternal Grandmother   . Hypertension Maternal Grandfather   . Cancer Maternal Grandfather        prostate  . Asthma Mother   . Asthma Brother   . Cancer Paternal Grandmother        breast  . Lupus Paternal Grandmother   . Anesthesia problems Neg Hx   . Hypotension Neg Hx   . Malignant hyperthermia Neg Hx   . Pseudochol deficiency Neg Hx      ROS Review of Systems See HPI Constitution: No fevers or chills No malaise No diaphoresis Skin: No rash or itching Eyes: no blurry vision, no double vision GU: no dysuria or hematuria Neuro: no dizziness or headaches all others reviewed and negative   Objective: Vitals:   11/26/17 1658  BP: (!) 166/112  Pulse: 98  Resp: 17  Temp: 99 F (37.2 C)  TempSrc: Oral  SpO2: 98%  Weight: (!) 347 lb 3.2 oz (157.5 kg)  Height:  (1.727 m)    Physical Exam  Constitutional: She is oriented to person, place, and time. She appears well-developed and well-nourished.  HENT:  Head: Normocephalic and atraumatic.  Eyes: Pupils are equal, round, and reactive to light. EOM are normal.  Neck: Normal range of motion. Neck supple.  No JVD, no bruits  Cardiovascular: Normal rate, regular  rhythm, intact distal pulses and normal pulses.  No murmur heard.  No systolic murmur is present.  No diastolic murmur is present. Pulmonary/Chest: Effort normal and breath sounds normal.  Abdominal: Soft. Bowel sounds are normal. She exhibits no distension, no ascites and no mass. There is no splenomegaly or hepatomegaly. There is no tenderness. There is no rebound and no guarding.  Neurological: She is alert and oriented to person, place, and time.  Skin: Skin is warm. Capillary refill takes less than 2 seconds. No erythema. No pallor.  Psychiatric: She has a normal mood and affect. Her behavior is normal.   ECG- sinus rhythm, no twi, no st elevation   Assessment and Plan Hayley Webb was seen today for chest pain.  Diagnoses and all orders for this visit:  Other chest pain -     EKG 12-Lead -     ECHOCARDIOGRAM STRESS TEST; Future  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HCC) -     ECHOCARDIOGRAM STRESS TEST; Future  Antiphospholipid antibody syndrome (HCC) -     ECHOCARDIOGRAM STRESS TEST; Future  Anticoagulated -     ECHOCARDIOGRAM STRESS TEST; Future  Patient with recurrent episodes of CP which was ruled out in the ER  She has complex medical history Messaged her Oncologist Would recommend stress testing with Echo She should follow up with me after stress testing  A total of 25 minutes were spent face-to-face with the patient during this encounter and over half of that time was spent on counseling and coordination of care. Reviewed previous discharge summaries  Discussed labs and work up in the ER   Tsuruko Murtha A [employer]

## 2017-11-28 ENCOUNTER — Telehealth: Payer: Self-pay | Admitting: Hematology and Oncology

## 2017-11-28 NOTE — Telephone Encounter (Signed)
Poke to patient regarding upcoming May appointments per 5/7 sch message

## 2017-11-29 ENCOUNTER — Telehealth: Payer: Self-pay | Admitting: Hematology and Oncology

## 2017-11-29 ENCOUNTER — Inpatient Hospital Stay: Payer: 59 | Attending: Hematology and Oncology | Admitting: Hematology and Oncology

## 2017-11-29 ENCOUNTER — Other Ambulatory Visit: Payer: Self-pay | Admitting: Hematology and Oncology

## 2017-11-29 ENCOUNTER — Telehealth (HOSPITAL_COMMUNITY): Payer: Self-pay | Admitting: *Deleted

## 2017-11-29 DIAGNOSIS — Z79899 Other long term (current) drug therapy: Secondary | ICD-10-CM

## 2017-11-29 DIAGNOSIS — D6861 Antiphospholipid syndrome: Secondary | ICD-10-CM | POA: Diagnosis not present

## 2017-11-29 DIAGNOSIS — R079 Chest pain, unspecified: Secondary | ICD-10-CM | POA: Insufficient documentation

## 2017-11-29 DIAGNOSIS — Z7901 Long term (current) use of anticoagulants: Secondary | ICD-10-CM | POA: Diagnosis not present

## 2017-11-29 DIAGNOSIS — I2699 Other pulmonary embolism without acute cor pulmonale: Secondary | ICD-10-CM | POA: Insufficient documentation

## 2017-11-29 NOTE — Progress Notes (Signed)
Patient Care Team: Garnetta Buddy, [place] as PCP - General (Physician Assistant)  DIAGNOSIS:  Encounter Diagnosis  Name Primary?  Marland Kitchen Antiphospholipid antibody syndrome (HCC)     CHIEF COMPLIANT: Follow-up of recent symptoms of chest pain  INTERVAL HISTORY: Hayley Webb is a 32 year old with above-mentioned history of antiphospholipid antibody syndrome who is currently on anticoagulation with Eliquis.  Initial lupus anticoagulant testing was positive but repeat testing was negative.  We decided to treat her with anticoagulation for 1 year.  She almost completed a year of therapy and is here for follow-up.  She reports that lately she has been having frequent chest pains and she had been to the emergency room.  D-dimers were checked and were negative.  Because of this, she is being prescription sent for a stress test.  She has not had any bleeding or bruising problems related to anticoagulation.  REVIEW OF SYSTEMS:   Constitutional: Denies fevers, chills or abnormal weight loss Eyes: Denies blurriness of vision Ears, nose, mouth, throat, and face: Denies mucositis or sore throat Respiratory: Denies cough, dyspnea or wheezes Cardiovascular: Recent chest pain Gastrointestinal:  Denies nausea, heartburn or change in bowel habits Skin: Denies abnormal skin rashes Lymphatics: Denies new lymphadenopathy or easy bruising Neurological:Denies numbness, tingling or new weaknesses Behavioral/Psych: Mood is stable, no new changes  Extremities: No lower extremity edema  All other systems were reviewed with the patient and are negative.  I have reviewed the past medical history, past surgical history, social history and family history with the patient and they are unchanged from previous note.  ALLERGIES:  is allergic to bactrim; prednisone; nsaids; other; pineapple; procardia [nifedipine]; and vicodin [hydrocodone-acetaminophen].  MEDICATIONS:  Current Outpatient Medications  Medication Sig  Dispense Refill  . acetaminophen (TYLENOL) 500 MG tablet Take 1,000 mg by mouth every 6 (six) hours as needed for headache (pain).     Marland Kitchen albuterol (PROVENTIL HFA;VENTOLIN HFA) 108 (90 Base) MCG/ACT inhaler Inhale 2 puffs into the lungs every 6 (six) hours as needed for wheezing or shortness of breath.    Marland Kitchen albuterol (PROVENTIL) (2.5 MG/3ML) 0.083% nebulizer solution Take 2.5 mg by nebulization every 6 (six) hours as needed for wheezing or shortness of breath.    Marland Kitchen apixaban (ELIQUIS) 5 MG TABS tablet Take 1 tablet (5 mg total) by mouth 2 (two) times daily. 60 tablet 11  . hydroxychloroquine (PLAQUENIL) 200 MG tablet Take 200 mg by mouth daily.    . meloxicam (MOBIC) 7.5 MG tablet Take 1 tablet (7.5 mg total) by mouth 2 (two) times daily. (Patient taking differently: Take 7.5 mg by mouth daily as needed for pain. ) 60 tablet 0  . methylPREDNISolone (MEDROL) 4 MG tablet Take 4 mg by mouth daily.    . ondansetron (ZOFRAN ODT) 4 MG disintegrating tablet Take 1 tablet (4 mg total) by mouth every 8 (eight) hours as needed for nausea or vomiting. 15 tablet 0  . traMADol (ULTRAM) 50 MG tablet Take 1 tablet (50 mg total) by mouth every 6 (six) hours as needed. (Patient taking differently: Take 50 mg by mouth every 6 (six) hours as needed for moderate pain or severe pain. ) 15 tablet 0   No current facility-administered medications for this visit.     PHYSICAL EXAMINATION: ECOG PERFORMANCE STATUS: 1 - Symptomatic but completely ambulatory  Vitals:   11/29/17 0845  BP: (!) 151/120  Pulse: 81  Resp: 17  Temp: 98.4 F (36.9 C)  SpO2: 99%   Filed Weights  11/29/17 0845  Weight: (!) 349 lb 9.6 oz (158.6 kg)    GENERAL:alert, no distress and comfortable SKIN: skin color, texture, turgor are normal, no rashes or significant lesions EYES: normal, Conjunctiva are pink and non-injected, sclera clear OROPHARYNX:no exudate, no erythema and lips, buccal mucosa, and tongue normal  NECK: supple, thyroid  normal size, non-tender, without nodularity LYMPH:  no palpable lymphadenopathy in the cervical, axillary or inguinal LUNGS: clear to auscultation and percussion with normal breathing effort HEART: regular rate & rhythm and no murmurs and no lower extremity edema ABDOMEN:abdomen soft, non-tender and normal bowel sounds MUSCULOSKELETAL:no cyanosis of digits and no clubbing  NEURO: alert & oriented x 3 with fluent speech, no focal motor/sensory deficits EXTREMITIES: No lower extremity edema  LABORATORY DATA:  I have reviewed the data as listed CMP Latest Ref Rng & Units 11/21/2017 11/19/2017 09/18/2017  Glucose 65 - 99 mg/dL 93 81 161(W)  BUN 6 - 20 mg/dL Creatinine 0.44 - 1.00 mg/dL 9.60 4.54 0.98  Sodium 135 - 145 mmol/L 134(L) 138 139  Potassium 3.5 - 5.1 mmol/L 3.7 3.6 3.6  Chloride 101 - 111 mmol/L 103 104 107  CO2 22 - 32 mmol/L Calcium 8.9 - 10.3 mg/dL 9.0 9.5 9.2  Total Protein 6.0 - 8.5 g/dL - - -  Total Bilirubin 0.0 - 1.2 mg/dL - - -  Alkaline Phos 39 - 117 IU/L - - -  AST 0 - 40 IU/L - - -  ALT 0 - 32 IU/L - - -    Lab Results  Component Value Date   WBC 5.2 11/21/2017   HGB 12.5 11/21/2017   HCT 36.9 11/21/2017   MCV 80.2 11/21/2017   PLT 351 11/21/2017   NEUTROABS 2.6 11/21/2017    ASSESSMENT & PLAN:  Antiphospholipid antibody syndrome (HCC) Pulmonary embolism diagnosed 12/30/2016 when she presented with chest pain and leg pains: Right lower lobe pulmonary artery Initial testing was positive for lupus anticoagulant but repeat testing was negative.  Current treatment: Eliquis (previously was treated with Coumadin but she did not like taking it because of inconvenience)  Recent onset of chest pains:  In the emergency room she had d-dimer and troponins which were both negative and she was discharged home.  Her primary care physician is working her up for a stress test. Other differential diagnosis could include lupus related pleuritic pain Or it  could be scar tissue from the resolved previous blood clot related lung injury  Recommendation: I discussed with her about switching back to Coumadin but she was not interested in that.  We do not have great data for Eliquis and antiphospholipid antibody syndrome.  Considering that the d-dimer was negative, I recommended long-term anticoagulation therapy for her.  With her frequent chest pains, I did not recommend discontinuation of anticoagulation at this time.  Return to clinic annually for follow-up on Eliquis.   No orders of the defined types were placed in this encounter.  The patient has a good understanding of the overall plan. she agrees with it. she will call with any problems that may develop before the next visit here.   Tamsen Meek, MD 11/29/17

## 2017-11-29 NOTE — Assessment & Plan Note (Signed)
Pulmonary embolism diagnosed 12/30/2016 when she presented with chest pain and leg pains: Right lower lobe pulmonary artery Initial testing was positive for lupus anticoagulant but repeat testing was negative.  Current treatment: Eliquis (previously was treated with Coumadin but she did not like taking it because of inconvenience)  Recent onset of chest pains:  In the emergency room she had d-dimer and troponins which were both negative and she was discharged home.  Her primary care physician is working her up for a stress test.  Recommendation: I discussed with her about switching back to Coumadin but she was not interested in that.  We do not have great data for Eliquis and antiphospholipid antibody syndrome.  Considering that the d-dimer was negative, I recommended long-term anticoagulation therapy for her.  With her frequent chest pains, I did not recommend discontinuation of anticoagulation at this time.  Return to clinic annually for follow-up on Eliquis.

## 2017-11-29 NOTE — Telephone Encounter (Signed)
Patient given detailed instructions per Stress Test Requisition Sheet for test on 12/03/17 at 7:30.Patient Notified to arrive 30 minutes early, and that it is imperative to arrive on time for appointment to keep from having the test rescheduled.  Patient verbalized understanding. Daneil Dolin

## 2017-11-29 NOTE — Telephone Encounter (Signed)
Per 5/9 no los 

## 2017-11-30 ENCOUNTER — Telehealth: Payer: Self-pay | Admitting: Family Medicine

## 2017-11-30 NOTE — Telephone Encounter (Signed)
Copied from CRM [id number]. Topic: Quick Communication - See Telephone Encounter >> Nov 30, 2017  3:58 PM Diana Eves B wrote: CRM for notification. See Telephone encounter for: 11/30/17.  Pt is calling in stating  is needing a PA on the stat stress test due to them being out of network with her insurance she is wanting to know if this can be sent to someone in network.

## 2017-12-03 ENCOUNTER — Other Ambulatory Visit (HOSPITAL_COMMUNITY): Payer: 59

## 2017-12-03 ENCOUNTER — Telehealth: Payer: Self-pay | Admitting: Family Medicine

## 2017-12-03 ENCOUNTER — Ambulatory Visit: Payer: 59 | Admitting: Family Medicine

## 2017-12-03 NOTE — Telephone Encounter (Signed)
Spoke with pt to let her know Echo with [facility] is still under review for prior auth and we were given a turn around time of 1-2 business days from 11/30/17. I told her I will check on this again today before I leave and tomorrow to see if it has been approved. I let pt know we could try someone in network if she would like, I will just need her to let me know a few providers in network with her insurance that we can send to hopefully expedite Echo. I spoke with Delores and per Dr. Creta Levin, pt should call us to reschedule appt that was originally scheduled for today once Echo has been done. Delores said to have pt ask for her so she can make sure pt gets worked into Dr. Creta Levin schedule. Appt has been canceled for today and pt will call back with names of in-network provider to try while waiting for prior auth for [facility] as well as will call back to schedule appt once Echo is done.

## 2017-12-03 NOTE — Progress Notes (Deleted)
No chief complaint on file.   HPI  4 review of systems  Past Medical History:  Diagnosis Date  . Anxiety    doing good now  . Asthma   . Headache(784.0)   . HSV infection   . Infection    UTI  . Lupus (HCC)    dx age 32  . Pulmonary embolism (HCC) 06/4/82011  . STD (sexually transmitted disease) 02/2009   POSITIVE GC    Current Outpatient Medications  Medication Sig Dispense Refill  . acetaminophen (TYLENOL) 500 MG tablet Take 1,000 mg by mouth every 6 (six) hours as needed for headache (pain).     Marland Kitchen albuterol (PROVENTIL HFA;VENTOLIN HFA) 108 (90 Base) MCG/ACT inhaler Inhale 2 puffs into the lungs every 6 (six) hours as needed for wheezing or shortness of breath.    Marland Kitchen albuterol (PROVENTIL) (2.5 MG/3ML) 0.083% nebulizer solution Take 2.5 mg by nebulization every 6 (six) hours as needed for wheezing or shortness of breath.    Marland Kitchen apixaban (ELIQUIS) 5 MG TABS tablet Take 1 tablet (5 mg total) by mouth 2 (two) times daily. 60 tablet 11  . hydroxychloroquine (PLAQUENIL) 200 MG tablet Take 200 mg by mouth daily.    . meloxicam (MOBIC) 7.5 MG tablet Take 1 tablet (7.5 mg total) by mouth 2 (two) times daily. (Patient taking differently: Take 7.5 mg by mouth daily as needed for pain. ) 60 tablet 0  . methylPREDNISolone (MEDROL) 4 MG tablet Take 4 mg by mouth daily.    . ondansetron (ZOFRAN ODT) 4 MG disintegrating tablet Take 1 tablet (4 mg total) by mouth every 8 (eight) hours as needed for nausea or vomiting. 15 tablet 0  . traMADol (ULTRAM) 50 MG tablet Take 1 tablet (50 mg total) by mouth every 6 (six) hours as needed. (Patient taking differently: Take 50 mg by mouth every 6 (six) hours as needed for moderate pain or severe pain. ) 15 tablet 0   No current facility-administered medications for this visit.     Allergies:  Allergies  Allergen Reactions  . Bactrim Anaphylaxis and Hives  . Prednisone Anaphylaxis and Hives    Has been on Dexamethasone without issue.  . Nsaids Other  (See Comments)    Has Lupus, reccommended to avoid NSAIDs  . Other Itching    Shrimp:throat itching "can eat other shellfish"  . Pineapple Itching  . Procardia [Nifedipine] Swelling    Swelling of tongue  . Vicodin [Hydrocodone-Acetaminophen] Hives and Swelling    Can take Percocet without difficulty    Past Surgical History:  Procedure Laterality Date  . MOUTH SURGERY     2 teeth removed- 1 wisdom and 1 in front of it  . TONSILLECTOMY AND ADENOIDECTOMY  1998  . WISDOM TOOTH EXTRACTION      Social History   Socioeconomic History  . Marital status: Single    Spouse name: Not on file  . Number of children: Not on file  . Years of education: Not on file  . Highest education level: Not on file  Occupational History  . Not on file  Social Needs  . Financial resource strain: Not on file  . Food insecurity:    Worry: Not on file    Inability: Not on file  . Transportation needs:    Medical: Not on file    Non-medical: Not on file  Tobacco Use  . Smoking status: Never Smoker  . Smokeless tobacco: Never Used  Substance and Sexual Activity  . Alcohol  use: No  . Drug use: No  . Sexual activity: Yes    Partners: Male    Birth control/protection: None  Lifestyle  . Physical activity:    Days per week: Not on file    Minutes per session: Not on file  . Stress: Not on file  Relationships  . Social connections:    Talks on phone: Not on file    Gets together: Not on file    Attends religious service: Not on file    Active member of club or organization: Not on file    Attends meetings of clubs or organizations: Not on file    Relationship status: Not on file  Other Topics Concern  . Not on file  Social History Narrative  . Not on file    Family History  Problem Relation Age of Onset  . Diabetes Maternal Aunt   . Cancer Maternal Grandmother 72       COLON CA  . Diabetes Maternal Grandmother   . Hypertension Maternal Grandfather   . Cancer Maternal Grandfather          prostate  . Asthma Mother   . Asthma Brother   . Cancer Paternal Grandmother        breast  . Lupus Paternal Grandmother   . Anesthesia problems Neg Hx   . Hypotension Neg Hx   . Malignant hyperthermia Neg Hx   . Pseudochol deficiency Neg Hx      ROS Review of Systems See HPI Constitution: No fevers or chills No malaise No diaphoresis Skin: No rash or itching Eyes: no blurry vision, no double vision GU: no dysuria or hematuria Neuro: no dizziness or headaches * all others reviewed and negative   Objective: There were no vitals filed for this visit.  Physical Exam  Assessment and Plan There are no diagnoses linked to this encounter.   Hayley Webb [employer]

## 2017-12-05 ENCOUNTER — Telehealth: Payer: Self-pay | Admitting: Family Medicine

## 2017-12-05 ENCOUNTER — Encounter: Payer: Self-pay | Admitting: Family Medicine

## 2017-12-05 NOTE — Telephone Encounter (Signed)
I agree with the in-network change.

## 2017-12-05 NOTE — Telephone Encounter (Signed)
[facility] called back and said they cannot do a stress echo but they can do just an echo. The other option for a cardiologist in network with pt insurance is [facility]. They are only open Tuesdays from 8-5pm. Do we want to try [facility] or do we want to change order to just Echo? Other option would be provider could call [employer] and try to do a peer to peer for Echo that was denied for [facility]. [facility] provider number is [phone].

## 2017-12-05 NOTE — Telephone Encounter (Signed)
Please see notes below.

## 2017-12-05 NOTE — Telephone Encounter (Signed)
Pt's Echo for [facility] has been denied. I spoke with pt to let her know this and asked her if we were to use an in-network provider, would her insurance cover this? She said she does not need prior auth with in-network providers. [facility] is in network for pt, so I have faxed order to [facility] on 5/15 and provided pt their information to call and schedule.

## 2017-12-13 NOTE — Telephone Encounter (Signed)
Lets try [facility] please.

## 2017-12-14 NOTE — Telephone Encounter (Signed)
Called pt insurance and pt does need a prior auth which annie has tried numerous times to get auth approved but the insurance keeps denying it. A peer to peer can be done as annie stated in the last note.

## 2018-02-26 ENCOUNTER — Ambulatory Visit: Payer: Self-pay | Admitting: *Deleted

## 2018-02-26 NOTE — Telephone Encounter (Signed)
Pt called with havng "intermittently funny feeling in chest and dizziness since 02/21/18; she is most concerned about the dizziness after she has the funny feeling in her chest; the pt says that she "just does not feel like herself"; recommendations made per nurse triage protocol to include seeing a physician within 24 hours; pt offered and accepted appointment with Dr Collie SiadZoe Stallings; Ernesto RutherfordPomona, 02/27/18 at 0940; she verbalizes understanding; will route to office for notification of this upcoming appointment.  Reason for Disposition . [1] MODERATE dizziness (e.g., interferes with normal activities) AND [2] has NOT been evaluated by physician for this  (Exception: dizziness caused by heat exposure, sudden standing, or poor fluid intake)  Answer Assessment - Initial Assessment Questions 1. DESCRIPTION: "Describe your dizziness."     lightheaded 2. LIGHTHEADED: "Do you feel lightheaded?" (e.g., somewhat faint, woozy, weak upon standing)     No; describes as an "uneasy feeling" 3. VERTIGO: "Do you feel like either you or the room is spinning or tilting?" (i.e. vertigo)     no 4. SEVERITY: "How bad is it?"  "Do you feel like you are going to faint?" "Can you stand and walk?"   - MILD - walking normally   - MODERATE - interferes with normal activities (e.g., work, school)    - SEVERE - unable to stand, requires support to walk, feels like passing out now.      moderate 5. ONSET:  "When did the dizziness begin?"     02/21/18 6. AGGRAVATING FACTORS: "Does anything make it worse?" (e.g., standing, change in head position)     no 7. HEART RATE: "Can you tell me your heart rate?" "How many beats in 15 seconds?"  (Note: not all patients can do this)       30 beats in 15 seconds 8. CAUSE: "What do you think is causing the dizziness?"     unsure 9. RECURRENT SYMPTOM: "Have you had dizziness before?" If so, ask: "When was the last time?" "What happened that time?"     mp 10. OTHER SYMPTOMS: "Do you have any other  symptoms?" (e.g., fever, chest pain, vomiting, diarrhea, bleeding)       "funny feeling in chest" 11. PREGNANCY: "Is there any chance you are pregnant?" "When was your last menstrual period?"       Yes; LMP 02/04/18  Protocols used: DIZZINESS [facility]- LIGHTHEADEDNESS-A-AH

## 2018-02-27 ENCOUNTER — Other Ambulatory Visit: Payer: Self-pay

## 2018-02-27 ENCOUNTER — Ambulatory Visit (INDEPENDENT_AMBULATORY_CARE_PROVIDER_SITE_OTHER): Payer: 59

## 2018-02-27 ENCOUNTER — Ambulatory Visit (INDEPENDENT_AMBULATORY_CARE_PROVIDER_SITE_OTHER): Payer: 59 | Admitting: Family Medicine

## 2018-02-27 ENCOUNTER — Telehealth: Payer: Self-pay | Admitting: Family Medicine

## 2018-02-27 ENCOUNTER — Encounter: Payer: Self-pay | Admitting: Family Medicine

## 2018-02-27 VITALS — BP 137/89 | HR 88 | Temp 98.3°F | Resp 16 | Ht 68.0 in | Wt 341.4 lb

## 2018-02-27 DIAGNOSIS — I2699 Other pulmonary embolism without acute cor pulmonale: Secondary | ICD-10-CM

## 2018-02-27 DIAGNOSIS — M329 Systemic lupus erythematosus, unspecified: Secondary | ICD-10-CM

## 2018-02-27 DIAGNOSIS — R42 Dizziness and giddiness: Secondary | ICD-10-CM

## 2018-02-27 DIAGNOSIS — R079 Chest pain, unspecified: Secondary | ICD-10-CM | POA: Diagnosis not present

## 2018-02-27 DIAGNOSIS — J4520 Mild intermittent asthma, uncomplicated: Secondary | ICD-10-CM

## 2018-02-27 DIAGNOSIS — Z7901 Long term (current) use of anticoagulants: Secondary | ICD-10-CM

## 2018-02-27 DIAGNOSIS — IMO0002 Reserved for concepts with insufficient information to code with codable children: Secondary | ICD-10-CM

## 2018-02-27 LAB — POCT CBC
Granulocyte percent: 2.5 %G — AB (ref 37–80)
HEMATOCRIT: 36 % — AB (ref 37.7–47.9)
Hemoglobin: 11.1 g/dL — AB (ref 12.2–16.2)
Lymph, poc: 43 — AB (ref 0.6–3.4)
MCH, POC: 25.1 pg — AB (ref 27–31.2)
MCHC: 31 g/dL — AB (ref 31.8–35.4)
MCV: 81.1 fL (ref 80–97)
MID (CBC): 49.1 — AB (ref 0–0.9)
MPV: 7.4 fL (ref 0–99.8)
PLATELET COUNT, POC: 396 10*3/uL (ref 142–424)
POC Granulocyte: 0.4 — AB (ref 2–6.9)
POC LYMPH %: 7.9 % — AB (ref 10–50)
POC MID %: 2.2 %M (ref 0–12)
RBC: 4.44 M/uL (ref 4.04–5.48)
RDW, POC: 15.2 %
WBC: 5 10*3/uL (ref 4.6–10.2)

## 2018-02-27 LAB — POCT URINALYSIS DIP (MANUAL ENTRY)
BILIRUBIN UA: NEGATIVE
BILIRUBIN UA: NEGATIVE mg/dL
GLUCOSE UA: NEGATIVE mg/dL
Nitrite, UA: NEGATIVE
Spec Grav, UA: 1.02 (ref 1.010–1.025)
Urobilinogen, UA: 1 E.U./dL
pH, UA: 7 (ref 5.0–8.0)

## 2018-02-27 LAB — POCT SEDIMENTATION RATE: POCT SED RATE: 60 mm/hr — AB (ref 0–22)

## 2018-02-27 LAB — POC MICROSCOPIC URINALYSIS (UMFC): MUCUS RE: ABSENT

## 2018-02-27 NOTE — Patient Instructions (Signed)
     IF you received an x-ray today, you will receive an invoice from [facility]. Please contact [facility] at [phone] with questions or concerns regarding your invoice.   IF you received labwork today, you will receive an invoice from [facility]. Please contact [facility] at [phone] with questions or concerns regarding your invoice.   Our billing staff will not be able to assist you with questions regarding bills from these companies.  You will be contacted with the lab results as soon as they are available. The fastest way to get your results is to activate your My Chart account. Instructions are located on the last page of this paperwork. If you have not heard from us regarding the results in 2 weeks, please contact this office.     

## 2018-02-27 NOTE — Progress Notes (Signed)
Chief Complaint  Patient presents with  . intermittent"funny feeling in chest annd dizziness after tha    initial onset: 02/21/18.  Per pt feels like she has the flu without ur symptoms, per pt she feels lethargic and fatigued.  Achy all over     HPI Dizziness Intermittent Chest Pain Pt is a 32yo with history of multiple episodes of chest pain with known PE and history of Lupus diagnosed at age 32 who presents with dizziness and chest pain She reports onset was 02/21/2018 She was referred for stress testing but she was not set up.  She reports that whenever she woke up Thursday morning she felt a little off like her chest felt funny and her body felt weak She reports that she went to work that day but over the course of the day the squeezing chest pain was like a tightness but she denies wheezing She sttaes that when she woke up she felt dizzy and her body felt like it was drained and weak She states that she gets the chest tightness and would feel dizzy after  She is having some symptoms that reminds her of a lupus flair She denies diaphoresis, palpitations, shakes  Morbid obesity She is drinking fluids and eating her normal meals She recently started going to a weight loss clinic so she has been on a different diet that is low is sugars and sweets. At the diet clinic she is taking beta hcg and a b12 shot Wt Readings from Last 3 Encounters:  02/27/18 (!) 341 lb 6.4 oz (154.9 kg)  11/29/17 (!) 349 lb 9.6 oz (158.6 kg)  11/26/17 (!) 347 lb 3.2 oz (157.5 kg)   She is compliant with all her medications.  Body mass index is 51.91 kg/m.  She denies bruising or bleeding into her BM She reports that she props herself up on pillows to go to sleep and recently had to prop herself up on 3 pillows. On one to two pillows she feels like she cannot breathe.   Lupus She reports that her last lupus flair was 08/2017 and typically affects her joints.  For her lupus she is on Plaquenil and Medrol 4mg   daily She denies joint swelling   Past Medical History:  Diagnosis Date  . Anxiety    doing good now  . Asthma   . Headache(784.0)   . HSV infection   . Infection    UTI  . Lupus (HCC)    dx age 32  . Pulmonary embolism (HCC) 06/4/82011  . STD (sexually transmitted disease) 02/2009   POSITIVE GC    Current Outpatient Medications  Medication Sig Dispense Refill  . acetaminophen (TYLENOL) 500 MG tablet Take 1,000 mg by mouth every 6 (six) hours as needed for headache (pain).     Marland Kitchen. albuterol (PROVENTIL HFA;VENTOLIN HFA) 108 (90 Base) MCG/ACT inhaler Inhale 2 puffs into the lungs every 6 (six) hours as needed for wheezing or shortness of breath.    Marland Kitchen. albuterol (PROVENTIL) (2.5 MG/3ML) 0.083% nebulizer solution Take 2.5 mg by nebulization every 6 (six) hours as needed for wheezing or shortness of breath.    Marland Kitchen. apixaban (ELIQUIS) 5 MG TABS tablet Take 1 tablet (5 mg total) by mouth 2 (two) times daily. 60 tablet 11  . hydroxychloroquine (PLAQUENIL) 200 MG tablet Take 200 mg by mouth daily.    . meloxicam (MOBIC) 7.5 MG tablet Take 1 tablet (7.5 mg total) by mouth 2 (two) times daily. (Patient taking differently: Take 7.5  mg by mouth daily as needed for pain. ) 60 tablet 0  . methylPREDNISolone (MEDROL) 4 MG tablet Take 4 mg by mouth daily.    . ondansetron (ZOFRAN ODT) 4 MG disintegrating tablet Take 1 tablet (4 mg total) by mouth every 8 (eight) hours as needed for nausea or vomiting. 15 tablet 0  . traMADol (ULTRAM) 50 MG tablet Take 1 tablet (50 mg total) by mouth every 6 (six) hours as needed. (Patient taking differently: Take 50 mg by mouth every 6 (six) hours as needed for moderate pain or severe pain. ) 15 tablet 0   No current facility-administered medications for this visit.     Allergies:  Allergies  Allergen Reactions  . Bactrim Anaphylaxis and Hives  . Prednisone Anaphylaxis and Hives    Has been on Dexamethasone without issue.  . Nsaids Other (See Comments)    Has  Lupus, reccommended to avoid NSAIDs  . Other Itching    Shrimp:throat itching "can eat other shellfish"  . Pineapple Itching  . Procardia [Nifedipine] Swelling    Swelling of tongue  . Vicodin [Hydrocodone-Acetaminophen] Hives and Swelling    Can take Percocet without difficulty    Past Surgical History:  Procedure Laterality Date  . MOUTH SURGERY     2 teeth removed- 1 wisdom and 1 in front of it  . TONSILLECTOMY AND ADENOIDECTOMY  1998  . WISDOM TOOTH EXTRACTION      Social History   Socioeconomic History  . Marital status: Single    Spouse name: Not on file  . Number of children: Not on file  . Years of education: Not on file  . Highest education level: Not on file  Occupational History  . Not on file  Social Needs  . Financial resource strain: Not on file  . Food insecurity:    Worry: Not on file    Inability: Not on file  . Transportation needs:    Medical: Not on file    Non-medical: Not on file  Tobacco Use  . Smoking status: Never Smoker  . Smokeless tobacco: Never Used  Substance and Sexual Activity  . Alcohol use: No  . Drug use: No  . Sexual activity: Yes    Partners: Male    Birth control/protection: None  Lifestyle  . Physical activity:    Days per week: Not on file    Minutes per session: Not on file  . Stress: Not on file  Relationships  . Social connections:    Talks on phone: Not on file    Gets together: Not on file    Attends religious service: Not on file    Active member of club or organization: Not on file    Attends meetings of clubs or organizations: Not on file    Relationship status: Not on file  Other Topics Concern  . Not on file  Social History Narrative  . Not on file    Family History  Problem Relation Age of Onset  . Diabetes Maternal Aunt   . Cancer Maternal Grandmother 72       COLON CA  . Diabetes Maternal Grandmother   . Hypertension Maternal Grandfather   . Cancer Maternal Grandfather        prostate  .  Asthma Mother   . Asthma Brother   . Cancer Paternal Grandmother        breast  . Lupus Paternal Grandmother   . Anesthesia problems Neg Hx   . Hypotension Neg  Hx   . Malignant hyperthermia Neg Hx   . Pseudochol deficiency Neg Hx      ROS Review of Systems See HPI Constitution: No fevers or chills No malaise No diaphoresis Skin: No rash or itching Eyes: no blurry vision, no double vision GU: no dysuria or hematuria Neuro: no dizziness or headaches all others reviewed and negative   Objective: Vitals:   02/27/18 0946  BP: 137/89  Pulse: 88  Resp: 16  Temp: 98.3 F (36.8 C)  TempSrc: Oral  SpO2: 100%  Weight: (!) 341 lb 6.4 oz (154.9 kg)  Height: 5\' 8"  (1.727 m)    Physical Exam  Constitutional: She is oriented to person, place, and time. She appears well-developed and well-nourished.  HENT:  Head: Normocephalic and atraumatic.  Eyes: Conjunctivae and EOM are normal.  Neck: Normal range of motion. Neck supple. No thyromegaly present.  Cardiovascular: Normal rate, regular rhythm and normal heart sounds.  No murmur heard. Pulmonary/Chest: Effort normal and breath sounds normal. No stridor. No respiratory distress. Chest wall is not dull to percussion. She exhibits no mass, no tenderness, no bony tenderness, no laceration, no crepitus, no edema, no deformity, no swelling and no retraction.  Abdominal: Soft. Bowel sounds are normal. She exhibits no distension and no mass. There is no tenderness. There is no guarding.  Neurological: She is oriented to person, place, and time.  Skin: Skin is warm. Capillary refill takes less than 2 seconds. No rash noted.  Psychiatric: She has a normal mood and affect. Her behavior is normal. Judgment and thought content normal.    ECG: unchanged from 11/26/2017 NSR, no twi, no st elevation  CLINICAL DATA:  Chest pain  EXAM: CHEST - 2 VIEW  COMPARISON:  Nov 21, 2017  FINDINGS: The heart size and mediastinal contours are within  normal limits. There is no focal infiltrate, pulmonary edema, or pleural effusion. The visualized skeletal structures are unremarkable.  IMPRESSION: No active cardiopulmonary disease.   Electronically Signed   By: Sherian Rein M.D.   On: 02/27/2018 10:50   Assessment and Plan Laureen was seen today for intermittent"funny feeling in chest annd dizziness after tha.  Diagnoses and all orders for this visit:  Chest pain, unspecified type -     EKG 12-Lead  Dizziness and giddiness -     EKG 12-Lead -     Ambulatory referral to Cardiology -     POCT CBC -     POCT SEDIMENTATION RATE -     Comprehensive metabolic panel -     POCT urinalysis dipstick -     POCT Microscopic Urinalysis ([facility])  Intermittent chest pain- will refer to Cardiology for evaluation for underlying heart disease  Attempted to get stress test but was not authorized by insurance.  Pt given detailed ER precautions and verbalizes understanding Stable for discharge home  -     Ambulatory referral to Cardiology -     DG Chest 2 View; Future -     POCT CBC -     POCT SEDIMENTATION RATE -     Comprehensive metabolic panel -     POCT urinalysis dipstick -     POCT Microscopic Urinalysis ([facility])  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HCC) -     Ambulatory referral to Cardiology -     DG Chest 2 View; Future  Mild intermittent asthma without complication- stable, no exacerbation  Obesity-BMI 47 -  Currently in a weight loss program  Lupus (HCC)- no leukocystosis Cannot  rule out a flare Will monitor closely -     POCT CBC -     POCT SEDIMENTATION RATE -     Comprehensive metabolic panel -     POCT urinalysis dipstick -     POCT Microscopic Urinalysis ([facility])  On continuous oral anticoagulation- continue eliquis    A total of 40  minutes were spent face-to-face with the patient during this encounter and over half of that time was spent on counseling and coordination of care.   Isaac Dubie A Axie Hayne

## 2018-02-27 NOTE — Telephone Encounter (Signed)
Copied from CRM [phone]. Topic: Inquiry >> Feb 27, 2018 12:29 PM Hayley Webb, Hayley Webb: Reason for CRM: pt returned a missed phone call, call pt if needed

## 2018-02-28 ENCOUNTER — Ambulatory Visit: Payer: Self-pay | Admitting: Family Medicine

## 2018-02-28 ENCOUNTER — Emergency Department (HOSPITAL_COMMUNITY): Payer: 59

## 2018-02-28 ENCOUNTER — Other Ambulatory Visit: Payer: Self-pay

## 2018-02-28 ENCOUNTER — Emergency Department (HOSPITAL_COMMUNITY)
Admission: EM | Admit: 2018-02-28 | Discharge: 2018-02-28 | Disposition: A | Discharge status: Home / Self Care | Payer: 59 | Attending: Emergency Medicine | Admitting: Emergency Medicine

## 2018-02-28 ENCOUNTER — Encounter (HOSPITAL_COMMUNITY): Payer: Self-pay

## 2018-02-28 DIAGNOSIS — Z79899 Other long term (current) drug therapy: Secondary | ICD-10-CM | POA: Diagnosis not present

## 2018-02-28 DIAGNOSIS — R110 Nausea: Secondary | ICD-10-CM | POA: Insufficient documentation

## 2018-02-28 DIAGNOSIS — R0789 Other chest pain: Secondary | ICD-10-CM | POA: Diagnosis not present

## 2018-02-28 DIAGNOSIS — R197 Diarrhea, unspecified: Secondary | ICD-10-CM | POA: Insufficient documentation

## 2018-02-28 DIAGNOSIS — R109 Unspecified abdominal pain: Secondary | ICD-10-CM | POA: Insufficient documentation

## 2018-02-28 DIAGNOSIS — M3210 Systemic lupus erythematosus, organ or system involvement unspecified: Secondary | ICD-10-CM | POA: Insufficient documentation

## 2018-02-28 DIAGNOSIS — Z7901 Long term (current) use of anticoagulants: Secondary | ICD-10-CM | POA: Insufficient documentation

## 2018-02-28 DIAGNOSIS — R42 Dizziness and giddiness: Secondary | ICD-10-CM | POA: Diagnosis not present

## 2018-02-28 DIAGNOSIS — Z86711 Personal history of pulmonary embolism: Secondary | ICD-10-CM | POA: Diagnosis not present

## 2018-02-28 LAB — I-STAT BETA HCG BLOOD, ED (NOT ORDERABLE): I-stat hCG, quantitative: <5 m[IU]/mL (ref <5)

## 2018-02-28 LAB — CBC
HEMATOCRIT: 36.4 % (ref 36.0–46.0)
Hemoglobin: 11.9 g/dL — ABNORMAL LOW (ref 12.0–15.0)
MCH: 26.9 pg (ref 26.0–34.0)
MCHC: 32.7 g/dL (ref 30.0–36.0)
MCV: 82.2 fL (ref 78.0–100.0)
PLATELETS: 429 10*3/uL — AB (ref 150–400)
RBC: 4.43 MIL/uL (ref 3.87–5.11)
RDW: 14.6 % (ref 11.5–15.5)
WBC: 5.6 10*3/uL (ref 4.0–10.5)

## 2018-02-28 LAB — BASIC METABOLIC PANEL
Anion gap: 9 (ref 5–15)
BUN: 12 mg/dL (ref 6–20)
CHLORIDE: 105 mmol/L (ref 98–111)
CO2: 25 mmol/L (ref 22–32)
CREATININE: 0.72 mg/dL (ref 0.44–1.00)
Calcium: 9.4 mg/dL (ref 8.9–10.3)
GFR calc Af Amer: >60 mL/min (ref >60)
GFR calc non Af Amer: >60 mL/min (ref >60)
Glucose, Bld: 85 mg/dL (ref 70–99)
Potassium: 3.7 mmol/L (ref 3.5–5.1)
Sodium: 139 mmol/L (ref 135–145)

## 2018-02-28 LAB — COMPREHENSIVE METABOLIC PANEL
ALBUMIN: 4.1 g/dL (ref 3.5–5.5)
ALT: 12 IU/L (ref 0–32)
AST: 19 IU/L (ref 0–40)
Albumin/Globulin Ratio: 1.5 (ref 1.2–2.2)
Alkaline Phosphatase: 87 IU/L (ref 39–117)
BUN / CREAT RATIO: 16 (ref 9–23)
BUN: 12 mg/dL (ref 6–20)
CO2: 24 mmol/L (ref 20–29)
CREATININE: 0.73 mg/dL (ref 0.57–1.00)
Calcium: 9.4 mg/dL (ref 8.7–10.2)
Chloride: 103 mmol/L (ref 96–106)
GFR calc non Af Amer: 111 mL/min/{1.73_m2} (ref >59)
GFR, EST AFRICAN AMERICAN: 128 mL/min/{1.73_m2} (ref >59)
GLUCOSE: 95 mg/dL (ref 65–99)
Globulin, Total: 2.8 g/dL (ref 1.5–4.5)
Potassium: 4.5 mmol/L (ref 3.5–5.2)
Sodium: 140 mmol/L (ref 134–144)
TOTAL PROTEIN: 6.9 g/dL (ref 6.0–8.5)

## 2018-02-28 LAB — POCT I-STAT TROPONIN I
TROPONIN I, POC: 0 ng/mL (ref 0.00–0.08)
Troponin i, poc: 0 ng/mL (ref 0.00–0.08)

## 2018-02-28 LAB — D-DIMER, QUANTITATIVE: D-Dimer, Quant: 0.29 ug/mL-FEU (ref 0.00–0.50)

## 2018-02-28 MED ORDER — SODIUM CHLORIDE 0.9 % IV BOLUS
1000.0000 mL | Freq: Once | INTRAVENOUS | Status: AC
Start: 2018-02-28 — End: 2018-02-28
  Administered 2018-02-28: 1000 mL via INTRAVENOUS

## 2018-02-28 NOTE — Telephone Encounter (Signed)
  Pt c/o lightheadedness since August 1st. Pt stated that she is able to walk but feels like when she does walk,  she is going to pass out. Pt denies vertigo. Lightheadedness is moderate in severity. Heart rate is 80 per minute. Pt does not know what is causing the lightheadedness. Pt stated that she had dizziness before when she was diagnosed with a pulmonary embolism in 2018. Pt was treated for 3 total pulmonary embolisms from June to July in 2018.  Pt c/o diarrhea, nausea, feels "hot" but her temperature is 98.1, pt has a tight feeling in chest and feels thirsty. Pt stated that her chest tightness has been present since her first  pulmonary embolism.  Pt stated that there is a possibility that she may be pregnant. Her last menstrual period was July 16 th.  Care advice given per protocol. Called PCP office and spoke with NauruShanda. Lynford HumphreyShanda updated on condition and recommends pt to go to the ED.  Pt advised to go to the ED and pt stated [facility]. Pt stated that her boyfriend will drive her to the ED. Routing note back to practice.  Reason for Disposition . [1] MODERATE dizziness (e.g., interferes with normal activities) AND [2] has been evaluated by physician for this  Answer Assessment - Initial Assessment Questions 1. DESCRIPTION: "Describe your dizziness."     Feels like she is going to pass out 2. LIGHTHEADED: "Do you feel lightheaded?" (e.g., somewhat faint, woozy, weak upon standing)     yes 3. VERTIGO: "Do you feel like either you or the room is spinning or tilting?" (i.e. vertigo)     no 4. SEVERITY: "How bad is it?"  "Do you feel like you are going to faint?" "Can you stand and walk?"   - MILD - walking normally   - MODERATE - interferes with normal activities (e.g., work, school)    - SEVERE - unable to stand, requires support to walk, feels like passing out now.      moderate 5. ONSET:  "When did the dizziness begin?"     August 1 st 6. AGGRAVATING  FACTORS: "Does anything make it worse?" (e.g., standing, change in head position)     no 7. HEART RATE: "Can you tell me your heart rate?" "How many beats in 15 seconds?"  (Note: not all patients can do this)       HR 80 8. CAUSE: "What do you think is causing the dizziness?"     Pt does not know 9. RECURRENT SYMPTOM: "Have you had dizziness before?" If so, ask: "When was the last time?" "What happened that time?"     Yes- when pt has a pulmonary embolism in 2018 2 in June 1 July was in hospital for blood thinners now takes Eliquis 10. OTHER SYMPTOMS: "Do you have any other symptoms?" (e.g., fever, chest pain, vomiting, diarrhea, bleeding)       Diarrhea, nausea, feels hot, temp 98.1, tight chest feeling in chest since first PE., thirsty 11. PREGNANCY: "Is there any chance you are pregnant?" "When was your last menstrual period?"       Pt stated it is a "possibilty" LMP: July 16th  Protocols used: DIZZINESS [facility]- LIGHTHEADEDNESS-A-AH

## 2018-02-28 NOTE — ED Notes (Signed)
Pt ambulated self to the restroom and states that pain increased with the activity. O2 sats were 98% on Rm Air upon arrival back to stretcher.

## 2018-02-28 NOTE — Discharge Instructions (Addendum)
If your chest pain worsens or you develop shortness of breath, weakness, vomiting or any other new/concerning symptoms then return to the ER or call 911.

## 2018-02-28 NOTE — ED Triage Notes (Signed)
Pt states she has been having intermittent chest pain with dizziness. Pt states she is concerned she is dehydrated, and wanted to rule out a PE.

## 2018-02-28 NOTE — ED Provider Notes (Signed)
[facility]-EMERGENCY DEPT Provider Note   CSN: [id number] Arrival date & time: 02/28/18  1839     History   Chief Complaint Chief Complaint  Patient presents with  . Chest Pain  . Dizziness    HPI Nekia Maxham is a 32 y.o. female.  HPI  32 year old female with a history of lupus and pulmonary embolism who is currently on Eliquis presents with chest tightness and dizziness.  Started about a week ago.  The dizziness feels like she is lightheaded and is constant.  The chest tightness seems to come and go, lasting about 15 minutes at a time.  Nothing seems to induce it and it comes randomly.  There is no associated shortness of breath.  She does have asthma as well and it feels like the tightness she gets with asthma except without the wheezing or dyspnea.  No leg swelling.  Saw her PCP yesterday and was told to come to the ER if it did not get better.  She has been compliant with her Eliquis.  No headache.  Has been having some nausea, diarrhea, and some mild abdominal cramping over the last 3 days as well.  No blood in her stool.  Past Medical History:  Diagnosis Date  . Anxiety    doing good now  . Asthma   . Headache(784.0)   . HSV infection   . Infection    UTI  . Lupus (HCC)    dx age 89  . Pulmonary embolism (HCC) 06/4/82011  . 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 Chest pain 04/04/2017  . On continuous oral anticoagulation 04/04/2017  . 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 Nonintractable headache 04/04/2017  . Altered thought processes 04/04/2017  . Word finding difficulty 04/04/2017  . Antiphospholipid antibody syndrome (HCC) 02/10/2017  . Pulmonary embolus (HCC) 02/09/2017  . Pulmonary embolism (HCC) 12/26/2016  . Shortness of breath   . Vaginal delivery--VE assist 10/25/2013  . 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 Obesity-BMI 47 05/09/2013  . Hirsutism 10/02/2012  . STD (female) 01/18/2012  . Lupus (HCC)   . Asthma   . HSV infection     Past Surgical History:  Procedure Laterality Date  . MOUTH SURGERY     2 teeth removed- 1 wisdom and 1 in front of it  . TONSILLECTOMY AND ADENOIDECTOMY  1998  . WISDOM TOOTH EXTRACTION       OB History    Gravida  2   Para  1   Term  1   Preterm      AB  1   Living  1     SAB  1   TAB      Ectopic      Multiple      Live Births  1            Home Medications    Prior to Admission medications   Medication Sig Start Date End Date Taking? Authorizing Provider  acetaminophen (TYLENOL) 500 MG tablet Take 1,000 mg by mouth every 6 (six) hours as needed for headache (pain).    Yes [provider]  albuterol (PROVENTIL HFA;VENTOLIN HFA) 108 (90 Base) MCG/ACT inhaler Inhale 2 puffs into the lungs every 6 (six) hours as needed for wheezing or shortness of breath.   Yes [provider]  albuterol (PROVENTIL) (2.5 MG/3ML) 0.083% nebulizer solution Take 2.5 mg by nebulization every 6 (six) hours as needed for wheezing or shortness of breath.   Yes [provider]  apixaban (ELIQUIS) 5 MG TABS tablet Take 1 tablet (5 mg total) by mouth 2 (two) times daily. 04/27/17  Yes Serena Croissant, MD  hydroxychloroquine (PLAQUENIL) 200 MG tablet Take 200 mg by mouth daily.   Yes [provider]  meloxicam (MOBIC) 7.5 MG tablet Take 1 tablet (7.5 mg total) by mouth 2 (two) times daily. Patient taking differently: Take 7.5 mg by mouth daily as needed for pain.  02/19/17  Yes Calvert Cantor, MD  methylPREDNISolone (MEDROL) 4 MG tablet Take 4 mg by mouth daily.   Yes [provider]  ondansetron (ZOFRAN ODT) 4 MG disintegrating tablet Take 1 tablet (4 mg total) by mouth every 8 (eight) hours as needed for nausea or vomiting. 11/19/17  Yes Street, [place], PA-C  traMADol (ULTRAM) 50 MG  tablet Take 1 tablet (50 mg total) by mouth every 6 (six) hours as needed. Patient taking differently: Take 50 mg by mouth every 6 (six) hours as needed for moderate pain or severe pain.  07/03/17  Yes Jacalyn Lefevre, MD    Family History Family History  Problem Relation Age of Onset  . Diabetes Maternal Aunt   . Cancer Maternal Grandmother 72       COLON CA  . Diabetes Maternal Grandmother   . Hypertension Maternal Grandfather   . Cancer Maternal Grandfather        prostate  . Asthma Mother   . Asthma Brother   . Cancer Paternal Grandmother        breast  . Lupus Paternal Grandmother   . Anesthesia problems Neg Hx   . Hypotension Neg Hx   . Malignant hyperthermia Neg Hx   . Pseudochol deficiency Neg Hx     Social History Social History   Tobacco Use  . Smoking status: Never Smoker  . Smokeless tobacco: Never Used  Substance Use Topics  . Alcohol use: No  . Drug use: No     Allergies   Bactrim; Hydrocodone-acetaminophen; Nifedipine; Pineapple; Prednisone; Shellfish allergy; Sulfamethoxazole-trimethoprim; Nsaids; Other; and Vicodin [hydrocodone-acetaminophen]   Review of Systems Review of Systems  Constitutional: Negative for fever.  Respiratory: Positive for chest tightness. Negative for shortness of breath.   Cardiovascular: Positive for chest pain.  Gastrointestinal: Positive for abdominal pain, diarrhea and nausea. Negative for vomiting.  Neurological: Positive for light-headedness.  All other systems reviewed and are negative.    Physical Exam Updated Vital Signs BP 127/86   Pulse 66   Temp 98.4 F (36.9 C) (Oral)   Resp 17   Ht 5\' 8"  (1.727 m)   Wt (!) 154.2 kg   LMP 02/05/2018   SpO2 100%   BMI 51.70 kg/m   Physical Exam  Constitutional: She is oriented to person, place, and time. She appears well-developed and well-nourished.  Obese Resting comfortably, no acute distress  HENT:  Head: Normocephalic and atraumatic.  Right Ear: External  ear normal.  Left Ear: External ear normal.  Nose: Nose normal.  Eyes: Pupils are equal, round, and reactive to light. EOM are normal. Right eye exhibits no discharge. Left eye exhibits no discharge.  Cardiovascular: Normal rate, regular rhythm and normal heart sounds.  Pulmonary/Chest: Effort normal and breath sounds normal. She exhibits no tenderness.  Abdominal: Soft. There is no tenderness.  Musculoskeletal:       Right lower  leg: She exhibits no edema.       Left lower leg: She exhibits no edema.  Neurological: She is alert and oriented to person, place, and time.  CN 3-12 grossly intact. 5/5 strength in all 4 extremities. Grossly normal sensation. Normal finger to nose.   Skin: Skin is warm and dry.  Nursing note and vitals reviewed.    ED Treatments / Results  Labs (all labs ordered are listed, but only abnormal results are displayed) Labs Reviewed  CBC - Abnormal; Notable for the following components:      Result Value   Hemoglobin 11.9 (*)    Platelets 429 (*)    All other components within normal limits  BASIC METABOLIC PANEL  D-DIMER, QUANTITATIVE (NOT AT [facility])  I-STAT TROPONIN, ED  I-STAT BETA HCG BLOOD, ED ([facility], [facility], [facility] ONLY)  POCT I-STAT TROPONIN I  I-STAT BETA HCG BLOOD, ED (NOT ORDERABLE)  I-STAT TROPONIN, ED  POCT I-STAT TROPONIN I    EKG EKG Interpretation  Date/Time:  Thursday February 28 2018 18:50:18 EDT Ventricular Rate:  83 PR Interval:    QRS Duration: 102 QT Interval:  385 QTC Calculation: 453 R Axis:   23 Text Interpretation:  Sinus rhythm Low voltage, precordial leads RSR' in V1 or V2, right VCD or RVH Borderline T abnormalities, anterior leads no significant change since May 2019 Confirmed by Pricilla LovelessGoldston, Suzan Manon [phone]([id number]) on 02/28/2018 9:23:51 PM   Radiology Dg Chest 2 View  Result Date: 02/28/2018 CLINICAL DATA:  Lightheadedness and chest pain/tightness, pt was seen for the same 1 day ago and a CXR was done. Symptoms have persisted and worsened - hx  pulmonary embolism x 3 - never a smoker - hs asthma - Lupus EXAM: CHEST - 2 VIEW COMPARISON:  Chest x-ray on 02/27/2018 FINDINGS: The heart size and mediastinal contours are within normal limits. Both lungs are clear. The visualized skeletal structures are unremarkable. IMPRESSION: No active cardiopulmonary disease. Electronically Signed   By: Norva PavlovElizabeth  Brown M.D.   On: 02/28/2018 19:18   Dg Chest 2 View  Result Date: 02/27/2018 CLINICAL DATA:  Chest pain EXAM: CHEST - 2 VIEW COMPARISON:  Nov 21, 2017 FINDINGS: The heart size and mediastinal contours are within normal limits. There is no focal infiltrate, pulmonary edema, or pleural effusion. The visualized skeletal structures are unremarkable. IMPRESSION: No active cardiopulmonary disease. Electronically Signed   By: Sherian ReinWei-Chen  Lin M.D.   On: 02/27/2018 10:50    Procedures Procedures (including critical care time)  Medications Ordered in ED Medications  sodium chloride 0.9 % bolus 1,000 mL (0 mLs Intravenous Stopped 02/28/18 2232)     Initial Impression / Assessment and Plan / ED Course  I have reviewed the triage vital signs and the nursing notes.  Pertinent labs & imaging results that were available during my care of the patient were reviewed by me and considered in my medical decision making (see chart for details).     Chest tightness quite atypical.  I doubt ACS.  ECG is reassuring and initial troponin is negative.  Second troponin also negative.  She has a history of PE but has been compliant with her Eliquis and this presentation is not really consistent with PE.  However she is always not low risk given the prior history and thus a d-dimer was sent which is negative.  Given no hypoxia or dyspnea I do not think a work-up is needed.  She has cardiology follow-up tomorrow [facility].  While I do not  think this is ACS or cardiac I think it is reasonable to keep follow-up with cardiology as her chief complaint is chest tightness.   Otherwise she appears stable for discharge home.  Final Clinical Impressions(s) / ED Diagnoses   Final diagnoses:  Chest tightness    ED Discharge Orders    None       Pricilla Loveless, MD 02/28/18 2332

## 2018-03-05 ENCOUNTER — Telehealth: Payer: Self-pay | Admitting: Family Medicine

## 2018-03-05 NOTE — Telephone Encounter (Signed)
Please let her know to call her Lupus doctor since she feels like this might be a flair of her lupus. If she can get in tomorrow with then she should cancel her appt with me.

## 2018-03-05 NOTE — Telephone Encounter (Signed)
Tried contacting pt via phone-no answer and voicemail full and unable to leave voicemail. Dgaddy, CMA

## 2018-03-05 NOTE — Telephone Encounter (Signed)
Copied from CRM [phone]. Topic: Inquiry >> Mar 05, 2018 10:29 AM Crist InfanteHarrald, Kathy J wrote: Reason for CRM: pt has appt at 4 pm tomorrow, wed.  But pt is still not feeling well from ongoing issues.  She has a stress test and echo scheduled, but does not know what to do in the meantime. Was wanting to see Dr Creta LevinStallings today, but no appts. Pt states hard to walk, like she is having a lupus flair ups. Pt states also the dizzy has not gone away. Would like to know what she can do until these appts.   [facility] goes [facility]

## 2018-03-06 ENCOUNTER — Ambulatory Visit (INDEPENDENT_AMBULATORY_CARE_PROVIDER_SITE_OTHER): Payer: 59 | Admitting: Family Medicine

## 2018-03-06 VITALS — BP 137/92 | HR 90 | Temp 99.3°F | Resp 20 | Ht 70.08 in | Wt 332.0 lb

## 2018-03-06 DIAGNOSIS — R079 Chest pain, unspecified: Secondary | ICD-10-CM

## 2018-03-06 DIAGNOSIS — M329 Systemic lupus erythematosus, unspecified: Secondary | ICD-10-CM | POA: Diagnosis not present

## 2018-03-06 DIAGNOSIS — Z7901 Long term (current) use of anticoagulants: Secondary | ICD-10-CM | POA: Diagnosis not present

## 2018-03-06 DIAGNOSIS — IMO0002 Reserved for concepts with insufficient information to code with codable children: Secondary | ICD-10-CM

## 2018-03-06 LAB — POCT CBC
GRANULOCYTE PERCENT: 52.7 % (ref 37–80)
HEMATOCRIT: 36.8 % — AB (ref 37.7–47.9)
Hemoglobin: 10.8 g/dL — AB (ref 12.2–16.2)
LYMPH, POC: 1.9 (ref 0.6–3.4)
MCH, POC: 23.9 pg — AB (ref 27–31.2)
MCHC: 29.3 g/dL — AB (ref 31.8–35.4)
MCV: 81.3 fL (ref 80–97)
MID (cbc): 0.2 (ref 0–0.9)
MPV: 6.4 fL (ref 0–99.8)
POC GRANULOCYTE: 2.4 (ref 2–6.9)
POC LYMPH %: 42 % (ref 10–50)
POC MID %: 5.3 % (ref 0–12)
Platelet Count, POC: 415 10*3/uL (ref 142–424)
RBC: 4.53 M/uL (ref 4.04–5.48)
RDW, POC: 15.8 %
WBC: 4.5 10*3/uL — AB (ref 4.6–10.2)

## 2018-03-06 NOTE — Telephone Encounter (Signed)
Patient stated she never received a referral for a Rheumatologist.  She requested one, but no one ever called her with the information.  So she is coming in for her appt today.

## 2018-03-06 NOTE — Progress Notes (Signed)
Chief Complaint  Patient presents with  . Shortness of Breath    worsening, lethargic    HPI   She reports that she has not been feeling well.  She believes she is having a lupus flare She states that she does not have a rheumatologist but has been on plaquenil    She has a complicated history  She states that she was diagnosed with Lupus back in 2012 after her toe nail fell off.  She was noted to have SSA antibodies.  She states that she ususally gets chest pain, facial rash and joint pains when she gets a lupus flare up  Her maintenance treatment is plaquenil and Medrol She was stable for 5 years from 2013-2018 She had a pregnancy and delivered 10/25/2013 with only PIH and proteinuria.  She reports that her father's family has lupus  She [facility] and [facility] with [facility].  She was diagnosed with PE 12/30/2016 and was noted to have antiphospholipid antibody syndrome on her hospital records. She was stared on Eliquis for anticoagulation. She frequently goes to the ER for chest pains and dyspnea thus she was referred to Cardiology.   Component     Latest Ref Rng & Units 12/25/2016 02/26/2017 11/19/2017 11/21/2017 02/28/2018  D-Dimer, Quant     0.00 - 0.50 ug/mL-FEU 1.05 (H) 0.27 <0.27 0.41 0.29   She presented today for follow up for her chest pain She reports shortness of breath that is present with activity including eating  Past Medical History:  Diagnosis Date  . Anxiety    doing good now  . Asthma   . Headache(784.0)   . HSV infection   . Infection    UTI  . Lupus (HCC)    dx age 32  . Pulmonary embolism (HCC) 06/4/82011  . STD (sexually transmitted disease) 02/2009   POSITIVE GC    Current Outpatient Medications  Medication Sig Dispense Refill  . acetaminophen (TYLENOL) 500 MG tablet Take 1,000 mg by mouth every 6 (six) hours as needed for headache (pain).     Marland Kitchen. albuterol (PROVENTIL HFA;VENTOLIN HFA) 108 (90 Base)  MCG/ACT inhaler Inhale 2 puffs into the lungs every 6 (six) hours as needed for wheezing or shortness of breath.    Marland Kitchen. albuterol (PROVENTIL) (2.5 MG/3ML) 0.083% nebulizer solution Take 2.5 mg by nebulization every 6 (six) hours as needed for wheezing or shortness of breath.    Marland Kitchen. apixaban (ELIQUIS) 5 MG TABS tablet Take 1 tablet (5 mg total) by mouth 2 (two) times daily. 60 tablet 11  . hydroxychloroquine (PLAQUENIL) 200 MG tablet Take 200 mg by mouth daily.    . meloxicam (MOBIC) 7.5 MG tablet Take 1 tablet (7.5 mg total) by mouth 2 (two) times daily. (Patient taking differently: Take 7.5 mg by mouth daily as needed for pain. ) 60 tablet 0  . methylPREDNISolone (MEDROL) 4 MG tablet Take 4 mg by mouth daily.    . ondansetron (ZOFRAN ODT) 4 MG disintegrating tablet Take 1 tablet (4 mg total) by mouth every 8 (eight) hours as needed for nausea or vomiting. 15 tablet 0  . traMADol (ULTRAM) 50 MG tablet Take 1 tablet (50 mg total) by mouth every 6 (six) hours as needed. (Patient taking differently: Take 50 mg by mouth every 6 (six) hours as needed for moderate pain or severe pain. ) 15 tablet 0   No current facility-administered medications for this visit.     Allergies:  Allergies  Allergen Reactions  . Bactrim Anaphylaxis and Hives  . Hydrocodone-Acetaminophen Anaphylaxis  . Nifedipine Swelling and Anaphylaxis    Swelling of tongue  . Pineapple Itching  . Prednisone Anaphylaxis and Hives    Has been on Dexamethasone without issue.  . Shellfish Allergy Itching  . Sulfamethoxazole-Trimethoprim Anaphylaxis  . Nsaids Other (See Comments)    Has Lupus, reccommended to avoid NSAIDs  . Other Itching    Shrimp:throat itching "can eat other shellfish"  . Vicodin [Hydrocodone-Acetaminophen] Hives and Swelling    Can take Percocet without difficulty    Past Surgical History:  Procedure Laterality Date  . MOUTH SURGERY     2 teeth removed- 1 wisdom and 1 in front of it  . TONSILLECTOMY AND  ADENOIDECTOMY  1998  . WISDOM TOOTH EXTRACTION      Social History   Socioeconomic History  . Marital status: Single    Spouse name: Not on file  . Number of children: Not on file  . Years of education: Not on file  . Highest education level: Not on file  Occupational History  . Not on file  Social Needs  . Financial resource strain: Not on file  . Food insecurity:    Worry: Not on file    Inability: Not on file  . Transportation needs:    Medical: Not on file    Non-medical: Not on file  Tobacco Use  . Smoking status: Never Smoker  . Smokeless tobacco: Never Used  Substance and Sexual Activity  . Alcohol use: No  . Drug use: No  . Sexual activity: Yes    Partners: Male    Birth control/protection: None  Lifestyle  . Physical activity:    Days per week: Not on file    Minutes per session: Not on file  . Stress: Not on file  Relationships  . Social connections:    Talks on phone: Not on file    Gets together: Not on file    Attends religious service: Not on file    Active member of club or organization: Not on file    Attends meetings of clubs or organizations: Not on file    Relationship status: Not on file  Other Topics Concern  . Not on file  Social History Narrative  . Not on file    Family History  Problem Relation Age of Onset  . Diabetes Maternal Aunt   . Cancer Maternal Grandmother 72       COLON CA  . Diabetes Maternal Grandmother   . Hypertension Maternal Grandfather   . Cancer Maternal Grandfather        prostate  . Asthma Mother   . Asthma Brother   . Cancer Paternal Grandmother        breast  . Lupus Paternal Grandmother   . Anesthesia problems Neg Hx   . Hypotension Neg Hx   . Malignant hyperthermia Neg Hx   . Pseudochol deficiency Neg Hx      ROS Review of Systems See HPI Constitution: No fevers or chills No malaise No diaphoresis Skin: No rash or itching Eyes: no blurry vision, no double vision GU: no dysuria or  hematuria Neuro: no dizziness or headaches  all others reviewed and negative   Objective: Vitals:   03/06/18 1728  BP: (!) 137/92  Pulse: 90  Resp: 20  Temp: 99.3 F (37.4 C)  TempSrc: Oral  SpO2: 100%  Weight: (!) 332 lb (150.6 kg)  Height: 5' 10.08" (1.78  m)    Physical Exam  Constitutional: She is oriented to person, place, and time. She appears well-developed and well-nourished.  HENT:  Head: Normocephalic and atraumatic.  Right Ear: External ear normal.  Left Ear: External ear normal.  Eyes: Pupils are equal, round, and reactive to light. EOM are normal.  Neck: Normal range of motion. Neck supple.  Cardiovascular: Normal rate, regular rhythm and normal heart sounds.  Pulmonary/Chest: Effort normal and breath sounds normal. No stridor. No respiratory distress. She has no wheezes.  Abdominal: Soft. Bowel sounds are normal. She exhibits no distension. There is no tenderness. There is no guarding.  Musculoskeletal: Normal range of motion. She exhibits no edema.  Neurological: She is alert and oriented to person, place, and time.  Skin: Skin is warm. Capillary refill takes less than 2 seconds. No erythema.  Psychiatric: She has a normal mood and affect. Her behavior is normal. Judgment and thought content normal.      CLINICAL DATA:  Lightheadedness and chest pain/tightness, pt was seen for the same 1 day ago and a CXR was done. Symptoms have persisted and worsened - hx pulmonary embolism x 3 - never a smoker - hs asthma - Lupus  EXAM: CHEST - 2 VIEW  COMPARISON:  Chest x-ray on 02/27/2018  FINDINGS: The heart size and mediastinal contours are within normal limits. Both lungs are clear. The visualized skeletal structures are unremarkable.  IMPRESSION: No active cardiopulmonary disease.   Electronically Signed   By: Norva PavlovElizabeth  Brown M.D.   On: 02/28/2018 19:18   Assessment and Plan Hayley Webb was seen today for shortness of breath.  Diagnoses and all  orders for this visit:  Lupus (HCC) -     POCT CBC -     C3 and C4 -     Anti-DNA antibody, double-stranded -     Cancel: Cyclic Citrul Peptide Antibody, IGG -     Rheumatoid factor -     Comprehensive metabolic panel -     C-reactive protein -     Ambulatory referral to Rheumatology -     CYCLIC CITRUL PEPTIDE ANTIBODY, IGG/IGA  Intermittent chest pain -     CYCLIC CITRUL PEPTIDE ANTIBODY, IGG/IGA  Anticoagulated -     CYCLIC CITRUL PEPTIDE ANTIBODY, IGG/IGA   Referral to Rheumatology Gave ER precautions and discussed outpatient follow up with Cardiology Discussed her case with Rheumatology Marrianne MoodErin Grey PA She gave instructions about specialty labs Continue current meds     Hayley Webb

## 2018-03-06 NOTE — Patient Instructions (Signed)
Shortness of Breath, Adult Shortness of breath is when a person has trouble breathing enough air, or when a person feels like she or he is having trouble breathing in enough air. Shortness of breath could be a sign of medical problem. Follow these instructions at home: Pay attention to any changes in your symptoms. Take these actions to help with your condition:  Do not smoke. Smoking is a common cause of shortness of breath. If you smoke and you need help quitting, ask your health care provider.  Avoid things that can irritate your airways, such as: ? Mold. ? Dust. ? Air pollution. ? Chemical fumes. ? Things that can cause allergy symptoms (allergens), if you have allergies.  Keep your living space clean and free of mold and dust.  Rest as needed. Slowly return to your usual activities.  Take over-the-counter and prescription medicines, including oxygen and inhaled medicines, only as told by your health care provider.  Keep all follow-up visits as told by your health care provider. This is important.  Contact a health care provider if:  Your condition does not improve as soon as expected.  You have a hard time doing your normal activities, even after you rest.  You have new symptoms. Get help right away if:  Your shortness of breath gets worse.  You have shortness of breath when you are resting.  You feel light-headed or you faint.  You have a cough that is not controlled with medicines.  You cough up blood.  You have pain with breathing.  You have pain in your chest, arms, shoulders, or abdomen.  You have a fever.  You cannot walk up stairs or exercise the way that you normally do. This information is not intended to replace advice given to you by your health care provider. Make sure you discuss any questions you have with your health care provider. Document Released: 04/04/2001 Document Revised: 01/29/2016 Document Reviewed: 12/16/2015 Elsevier Interactive Patient  Education  2018 Elsevier Inc.  

## 2018-03-07 LAB — COMPREHENSIVE METABOLIC PANEL
A/G RATIO: 1.5 (ref 1.2–2.2)
ALBUMIN: 4.3 g/dL (ref 3.5–5.5)
ALK PHOS: 94 IU/L (ref 39–117)
ALT: 14 IU/L (ref 0–32)
AST: 17 IU/L (ref 0–40)
BILIRUBIN TOTAL: 0.4 mg/dL (ref 0.0–1.2)
BUN / CREAT RATIO: 12 (ref 9–23)
BUN: 9 mg/dL (ref 6–20)
CHLORIDE: 106 mmol/L (ref 96–106)
CO2: 22 mmol/L (ref 20–29)
Calcium: 9.4 mg/dL (ref 8.7–10.2)
Creatinine, Ser: 0.75 mg/dL (ref 0.57–1.00)
GFR calc non Af Amer: 107 mL/min/{1.73_m2} (ref >59)
GFR, EST AFRICAN AMERICAN: 124 mL/min/{1.73_m2} (ref >59)
Globulin, Total: 2.9 g/dL (ref 1.5–4.5)
Glucose: 84 mg/dL (ref 65–99)
POTASSIUM: 4.1 mmol/L (ref 3.5–5.2)
Sodium: 142 mmol/L (ref 134–144)
TOTAL PROTEIN: 7.2 g/dL (ref 6.0–8.5)

## 2018-03-07 LAB — ANTI-DNA ANTIBODY, DOUBLE-STRANDED: dsDNA Ab: <1 IU/mL (ref 0–9)

## 2018-03-07 LAB — RHEUMATOID FACTOR

## 2018-03-07 LAB — C3 AND C4
COMPLEMENT C3, SERUM: 177 mg/dL — AB (ref 82–167)
COMPLEMENT C4, SERUM: 45 mg/dL — AB (ref 14–44)

## 2018-03-07 LAB — C-REACTIVE PROTEIN: CRP: 20 mg/L — AB (ref 0–10)

## 2018-03-07 NOTE — Telephone Encounter (Signed)
Spoke with Malachi BondsGloria in referral and pt referred [facility], advised Dr Creta LevinStallings would like pt to see Elpidio Anisrin Gray, Malachi BondsGloria will setup referral with Dr. Wallace CullensGray and will contact pt. Dgaddy, CMA

## 2018-03-08 ENCOUNTER — Encounter: Payer: Self-pay | Admitting: Family Medicine

## 2018-03-08 LAB — CYCLIC CITRUL PEPTIDE ANTIBODY, IGG/IGA: Cyclic Citrullin Peptide Ab: 6 units (ref 0–19)

## 2018-03-12 ENCOUNTER — Telehealth: Payer: Self-pay | Admitting: Family Medicine

## 2018-03-12 NOTE — Telephone Encounter (Signed)
Patient needs FMLA forms completed by Dr Creta LevinStallings for her last 2 OV's for SOB. I have completed what I could from the OV notes and highlighted the areas I was not sure about. I will place the forms in Dr Creta LevinStallings box on 03/12/18 please return to the FMLA/Disability desk within 5-7 business days. Thank you!

## 2018-03-19 ENCOUNTER — Telehealth: Payer: Self-pay | Admitting: Family Medicine

## 2018-03-19 NOTE — Telephone Encounter (Signed)
Copied from CRM [id number]. Topic: Inquiry >> Mar 19, 2018 12:54 PM Debroah LoopLander, Lumin L wrote: Reason for CRM: Patient has been written out of work by Dr. Creta LevinStallings since 02/28/2018 and needs to know when she can return to work? Forms were also sent to Dr. Creta LevinStallings by her job requesting medical notes from the visit pertaining to her leave especially the last visit. Please call patient to assist/advise. It's due 08/29.

## 2018-03-20 NOTE — Telephone Encounter (Signed)
Have we completed these forms? Today is the 6th business day.  Please let me know.  Thank you

## 2018-03-20 NOTE — Telephone Encounter (Signed)
Paperwork scanned and faxed on 03/20/18

## 2018-03-21 ENCOUNTER — Telehealth: Payer: Self-pay | Admitting: Hematology and Oncology

## 2018-03-21 NOTE — Telephone Encounter (Signed)
[facility]ax[facility], Release ID: [id number]

## 2018-04-16 ENCOUNTER — Ambulatory Visit (INDEPENDENT_AMBULATORY_CARE_PROVIDER_SITE_OTHER)
Admission: EM | Admit: 2018-04-16 | Discharge: 2018-04-16 | Disposition: A | Discharge status: Home / Self Care | Payer: 59 | Source: Home / Self Care | Attending: Family Medicine | Admitting: Family Medicine

## 2018-04-16 ENCOUNTER — Emergency Department (HOSPITAL_COMMUNITY): Payer: 59

## 2018-04-16 ENCOUNTER — Emergency Department (HOSPITAL_COMMUNITY)
Admission: EM | Admit: 2018-04-16 | Discharge: 2018-04-17 | Disposition: A | Discharge status: Home / Self Care | Payer: 59 | Attending: Emergency Medicine | Admitting: Emergency Medicine

## 2018-04-16 ENCOUNTER — Encounter (HOSPITAL_COMMUNITY): Payer: Self-pay | Admitting: Emergency Medicine

## 2018-04-16 ENCOUNTER — Ambulatory Visit: Payer: Self-pay | Admitting: *Deleted

## 2018-04-16 ENCOUNTER — Other Ambulatory Visit: Payer: Self-pay

## 2018-04-16 DIAGNOSIS — R05 Cough: Secondary | ICD-10-CM | POA: Diagnosis not present

## 2018-04-16 DIAGNOSIS — J4520 Mild intermittent asthma, uncomplicated: Secondary | ICD-10-CM | POA: Insufficient documentation

## 2018-04-16 DIAGNOSIS — J069 Acute upper respiratory infection, unspecified: Secondary | ICD-10-CM | POA: Diagnosis not present

## 2018-04-16 DIAGNOSIS — R0602 Shortness of breath: Secondary | ICD-10-CM

## 2018-04-16 DIAGNOSIS — Z86711 Personal history of pulmonary embolism: Secondary | ICD-10-CM | POA: Diagnosis not present

## 2018-04-16 DIAGNOSIS — Z7901 Long term (current) use of anticoagulants: Secondary | ICD-10-CM | POA: Diagnosis not present

## 2018-04-16 DIAGNOSIS — R0789 Other chest pain: Secondary | ICD-10-CM

## 2018-04-16 DIAGNOSIS — J00 Acute nasopharyngitis [common cold]: Secondary | ICD-10-CM | POA: Insufficient documentation

## 2018-04-16 LAB — BASIC METABOLIC PANEL
Anion gap: 9 (ref 5–15)
BUN: 5 mg/dL — ABNORMAL LOW (ref 6–20)
CHLORIDE: 103 mmol/L (ref 98–111)
CO2: 25 mmol/L (ref 22–32)
CREATININE: 0.8 mg/dL (ref 0.44–1.00)
Calcium: 8.9 mg/dL (ref 8.9–10.3)
GFR calc non Af Amer: >60 mL/min (ref >60)
Glucose, Bld: 82 mg/dL (ref 70–99)
Potassium: 3.5 mmol/L (ref 3.5–5.1)
Sodium: 137 mmol/L (ref 135–145)

## 2018-04-16 LAB — CBC
HCT: 38.3 % (ref 36.0–46.0)
HEMOGLOBIN: 12.1 g/dL (ref 12.0–15.0)
MCH: 26.8 pg (ref 26.0–34.0)
MCHC: 31.6 g/dL (ref 30.0–36.0)
MCV: 84.7 fL (ref 78.0–100.0)
PLATELETS: 359 10*3/uL (ref 150–400)
RBC: 4.52 MIL/uL (ref 3.87–5.11)
RDW: 14.2 % (ref 11.5–15.5)
WBC: 6.7 10*3/uL (ref 4.0–10.5)

## 2018-04-16 LAB — I-STAT TROPONIN, ED: Troponin i, poc: 0.01 ng/mL (ref 0.00–0.08)

## 2018-04-16 LAB — I-STAT BETA HCG BLOOD, ED (MC, WL, AP ONLY): I-stat hCG, quantitative: <5 m[IU]/mL (ref <5)

## 2018-04-16 MED ORDER — IOPAMIDOL (ISOVUE-370) INJECTION 76%
100.0000 mL | Freq: Once | INTRAVENOUS | Status: AC | PRN
Start: 2018-04-16 — End: 2018-04-16
  Administered 2018-04-16: 100 mL via INTRAVENOUS

## 2018-04-16 MED ORDER — APIXABAN 5 MG PO TABS
5.0000 mg | ORAL_TABLET | Freq: Once | ORAL | Status: AC
  Administered 2018-04-17: 5 mg via ORAL
  Filled 2018-04-16: qty 1

## 2018-04-16 MED ORDER — IOPAMIDOL (ISOVUE-370) INJECTION 76%
INTRAVENOUS | Status: AC
  Filled 2018-04-16: qty 100

## 2018-04-16 NOTE — ED Provider Notes (Signed)
[facility] Provider [facility]  CSN: [id number] Arrival date & time: 04/16/18 1921  Chief Complaint(s) Chest Pain  HPI Hayley Webb is a 32 y.o. female   The history is provided by the patient.  Chest Pain   This is a new problem. Episode onset: 3 days. Episode frequency: intermittent. Progression since onset: fluctuating. Pain location: left lateral chest. The pain is moderate. The quality of the pain is described as pressure-like. The pain does not radiate. Episode Length: 3-5 min per episode. Associated symptoms include cough, nausea and shortness of breath. Pertinent negatives include no back pain, no dizziness, no fever, no hemoptysis, no irregular heartbeat, no malaise/fatigue and no vomiting. Risk factors include obesity.  Her past medical history is significant for DVT and PE (on Eliquis, but missed 1 week of meds).  Pertinent negatives for past medical history include no diabetes, no hyperlipidemia and no hypertension.    Past Medical History Past Medical History:  Diagnosis Date  . Anxiety    doing good now  . Asthma   . Headache(784.0)   . HSV infection   . Infection    UTI  . Lupus (HCC)    dx age 32  . Pulmonary embolism (HCC) 06/4/82011  . STD (sexually transmitted disease) 02/2009   POSITIVE GC   Patient Active Problem List   Diagnosis Date Noted  . Chest pain 04/04/2017  . On continuous oral anticoagulation 04/04/2017  . 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 Nonintractable headache 04/04/2017  . Altered thought processes 04/04/2017  . Word finding difficulty 04/04/2017  . Antiphospholipid antibody syndrome (HCC) 02/10/2017  . Pulmonary embolus (HCC) 02/09/2017  . Pulmonary embolism (HCC) 12/26/2016  . Shortness of breath   . Vaginal delivery--VE assist 10/25/2013  . 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 Obesity-BMI 47 05/09/2013  .  Hirsutism 10/02/2012  . STD (female) 01/18/2012  . Lupus (HCC)   . Asthma   . HSV infection    Home Medication(s) Prior to Admission medications   Medication Sig Start Date End Date Taking? Authorizing Provider  albuterol (PROVENTIL HFA;VENTOLIN HFA) 108 (90 Base) MCG/ACT inhaler Inhale 2 puffs into the lungs every 6 (six) hours as needed for wheezing or shortness of breath.   Yes [provider]  albuterol (PROVENTIL) (2.5 MG/3ML) 0.083% nebulizer solution Take 2.5 mg by nebulization every 6 (six) hours as needed for wheezing or shortness of breath.   Yes [provider]  apixaban (ELIQUIS) 5 MG TABS tablet Take 1 tablet (5 mg total) by mouth 2 (two) times daily. 04/27/17  Yes Serena CroissantGudena, Vinay, MD  hydroxychloroquine (PLAQUENIL) 200 MG tablet Take 200 mg by mouth daily.   Yes [provider]  methylPREDNISolone (MEDROL) 4 MG tablet Take 4 mg by mouth daily.   Yes [provider]  benzonatate (TESSALON) 100 MG capsule Take 1 capsule (100 mg total) by mouth every 8 (eight) hours. 04/17/18   Nira Connardama, Marcelus Dubberly Eduardo, MD  meloxicam (MOBIC) 7.5 MG tablet Take 1 tablet (7.5 mg total) by mouth 2 (two) times daily. Patient not taking: Reported on 04/17/2018 02/19/17   Calvert Cantorizwan, Saima, MD  ondansetron (ZOFRAN ODT) 4 MG disintegrating tablet Take 1 tablet (4 mg total) by mouth every 8 (eight) hours as needed for nausea or vomiting. Patient not taking: Reported on 04/17/2018 11/19/17   Street, VintonMercedes, PA-C  traMADol (ULTRAM) 50 MG tablet Take 1 tablet (50 mg total) by mouth  every 6 (six) hours as needed. Patient not taking: Reported on 04/17/2018 07/03/17   Jacalyn Lefevre, MD                                                                                                                                    Past Surgical History Past Surgical History:  Procedure Laterality Date  . MOUTH SURGERY     2 teeth removed- 1 wisdom and 1 in front of it  . TONSILLECTOMY AND  ADENOIDECTOMY  1998  . WISDOM TOOTH EXTRACTION     Family History Family History  Problem Relation Age of Onset  . Diabetes Maternal Aunt   . Cancer Maternal Grandmother 72       COLON CA  . Diabetes Maternal Grandmother   . Hypertension Maternal Grandfather   . Cancer Maternal Grandfather        prostate  . Asthma Mother   . Asthma Brother   . Cancer Paternal Grandmother        breast  . Lupus Paternal Grandmother   . Anesthesia problems Neg Hx   . Hypotension Neg Hx   . Malignant hyperthermia Neg Hx   . Pseudochol deficiency Neg Hx     Social History Social History   Tobacco Use  . Smoking status: Never Smoker  . Smokeless tobacco: Never Used  Substance Use Topics  . Alcohol use: No  . Drug use: No   Allergies Bactrim; Hydrocodone-acetaminophen; Nifedipine; Pineapple; Prednisone; Shellfish allergy; Sulfamethoxazole-trimethoprim; Nsaids; Other; and Vicodin [hydrocodone-acetaminophen]  Review of Systems Review of Systems  Constitutional: Negative for fever and malaise/fatigue.  HENT: Positive for congestion and rhinorrhea.   Respiratory: Positive for cough and shortness of breath. Negative for hemoptysis.   Cardiovascular: Positive for chest pain.  Gastrointestinal: Positive for nausea. Negative for vomiting.  Musculoskeletal: Negative for back pain.  Neurological: Negative for dizziness.   All other systems are reviewed and are negative for acute change except as noted in the HPI  Physical Exam Vital Signs  I have reviewed the triage vital signs BP (!) 164/97 (BP Location: Right Wrist)   Pulse 94   Temp 98.6 F (37 C) (Oral)   Resp (!) 25   SpO2 98%   Physical Exam  Constitutional: She is oriented to person, place, and time. She appears well-developed and well-nourished. No distress.  obese  HENT:  Head: Normocephalic and atraumatic.  Nose: Mucosal edema and rhinorrhea present.  Mouth/Throat: No oropharyngeal exudate or posterior oropharyngeal edema.  No tonsillar exudate.  Postnasal drip with cobblestoning.  Eyes: Pupils are equal, round, and reactive to light. Conjunctivae and EOM are normal. Right eye exhibits no discharge. Left eye exhibits no discharge. No scleral icterus.  Neck: Normal range of motion. Neck supple.  Cardiovascular: Normal rate and regular rhythm. Exam reveals no gallop and no friction rub.  No murmur heard. Pulmonary/Chest: Effort normal and breath sounds normal. No stridor. No tachypnea.  No respiratory distress. She has no wheezes. She has no rhonchi. She has no rales.  Abdominal: Soft. She exhibits no distension. There is no tenderness.  Musculoskeletal: She exhibits no edema or tenderness.  Neurological: She is alert and oriented to person, place, and time.  Skin: Skin is warm and dry. No rash noted. She is not diaphoretic. No erythema.  Psychiatric: She has a normal mood and affect.  Vitals reviewed.   ED Results and Treatments Labs (all labs ordered are listed, but only abnormal results are displayed) Labs Reviewed  BASIC METABOLIC PANEL - Abnormal; Notable for the following components:      Result Value   BUN 5 (*)    All other components within normal limits  CBC  I-STAT TROPONIN, ED  I-STAT BETA HCG BLOOD, ED ([facility], [facility], [facility] ONLY)                                                                                                                         EKG  EKG Interpretation  Date/Time:  Tuesday April 16 2018 19:38:39 EDT Ventricular Rate:  90 PR Interval:  160 QRS Duration: 86 QT Interval:  394 QTC Calculation: 481 R Axis:   44 Text Interpretation:  Normal sinus rhythm No significant change since last tracing Confirmed by Drema Pry [phone]) on 04/16/2018 11:29:02 PM      Radiology Dg Chest 2 View  Result Date: 04/16/2018 CLINICAL DATA:  Chest pain, congestion, shortness of breath, chills EXAM: CHEST - 2 VIEW COMPARISON:  02/28/2018 FINDINGS: Heart and mediastinal contours are within  normal limits. No focal opacities or effusions. No acute bony abnormality. IMPRESSION: No active cardiopulmonary disease. Electronically Signed   By: Charlett Nose M.D.   On: 04/16/2018 20:35   Ct Angio Chest Pe W And/or Wo Contrast  Result Date: 04/16/2018 CLINICAL DATA:  Intermittent left-sided chest pain and dyspnea x3 days EXAM: CT ANGIOGRAPHY CHEST WITH CONTRAST TECHNIQUE: Multidetector CT imaging of the chest was performed using the standard protocol during bolus administration of intravenous contrast. Multiplanar CT image reconstructions and MIPs were obtained to evaluate the vascular anatomy. CONTRAST:  ISOVUE-370 IOPAMIDOL (ISOVUE-370) INJECTION 76% COMPARISON:  04/04/2017 FINDINGS: Cardiovascular: The study is adequate for the evaluation of pulmonary embolism only to the distal lobar level due to suboptimal opacification beyond this. No large central pulmonary embolus. Heart size is normal without pericardial effusion. Great vessels are normal in course and caliber. Borderline enlarged heart size. No significant pericardial fluid/thickening. Nonaneurysmal thoracic aorta. No apparent aortic dissection. Mediastinum/Nodes: No discrete thyroid nodules. Unremarkable esophagus. No pathologically enlarged axillary, mediastinal or hilar lymph nodes. Lungs/Pleura: No pneumothorax. No pleural effusion. Minimal subsegmental atelectasis the left lower lobe anteriorly. Upper abdomen: Unremarkable. Musculoskeletal:  No aggressive appearing focal osseous lesions. Review of the MIP images confirms the above findings. IMPRESSION: No large central pulmonary embolus. No active pulmonary disease. Electronically Signed   By: Tollie Eth M.D.   On: 04/16/2018 22:41   Pertinent labs &  imaging results that were available during my care of the patient were reviewed by me and considered in my medical decision making (see chart for details).  Medications Ordered in ED Medications  iopamidol (ISOVUE-370) 76 % injection  100 mL (100 mLs Intravenous Contrast Given 04/16/18 2159)  apixaban (ELIQUIS) tablet 5 mg (5 mg Oral Given 04/17/18 0101)  ipratropium-albuterol (DUONEB) 0.5-2.5 (3) MG/3ML nebulizer solution 3 mL (3 mLs Nebulization Given 04/17/18 0014)                                                                                                                                    Procedures Procedures  (including critical care time)  Medical Decision Making / ED Course I have reviewed the nursing notes for this encounter and the patient's prior records (if available in EHR or on provided paperwork).    Patient presents with left lateral chest pain shortness of breath.  Has evidence of URI infection and mild asthma exacerbation.  However she is been off of her Eliquis for 1 week and has a history of pulmonary emboli due to lupus.  EKG nonischemic and reassuring.  Troponin negative.  CT PE study negative for any pulmonary emboli or pneumonia.  The patient appears reasonably screened and/or stabilized for discharge and I doubt any other medical condition or other [facility] requiring further screening, evaluation, or treatment in the ED at this time prior to discharge.  The patient is safe for discharge with strict return precautions.   Final Clinical Impression(s) / ED Diagnoses Final diagnoses:  Acute nasopharyngitis  Other chest pain  Mild intermittent asthma without complication    Disposition: Discharge  Condition: Good  I have discussed the results, Dx and Tx plan with the patient who expressed understanding and agree(s) with the plan. Discharge instructions discussed at great length. The patient was given strict return precautions who verbalized understanding of the instructions. No further questions at time of discharge.    ED Discharge Orders         Ordered    benzonatate (TESSALON) 100 MG capsule  Every 8 hours     04/17/18 0134           Follow Up: Doristine Bosworth, MD [address] [place] [address] [phone]   in 5-7 days, If symptoms do not improve or  worsen     This chart was dictated using voice recognition software.  Despite best efforts to proofread,  errors can occur which can change the documentation meaning.   Nira Conn, MD 04/17/18 [phone]

## 2018-04-16 NOTE — Discharge Instructions (Addendum)
Cannot rule out cardiac cause of symptoms.   Recommend further evaluation and management in the ED Patient aware and in agreement with this plan.

## 2018-04-16 NOTE — ED Triage Notes (Signed)
Pt sent by urgent care for evaluation of chest pain/shortness of breath x 2 days. Also c/o productive cough. Hx PE, has been out of blood thinner x 1 week.

## 2018-04-16 NOTE — ED Provider Notes (Signed)
[facility] CARE CENTER   [id number] 04/16/18 Arrival Time: 1735  CC: chest pain, cough, and URI symptoms  SUBJECTIVE:  Hayley Webb is a 32 y.o. female hx significant for PE on eliquis, who presents with nasal congestion, drainage, cough, and SOB for x 2 days.  Denies positive sick exposure or precipitating event.  Describes cough as intermittent and productive with clear/ yellow sputum.  Has tried albuterol, dayquil, benadryl allergy, and delsym without relief.  Symptoms are made worse with walking and laying down.  Reports previous symptoms in the past. Complains of chills, fatigue, sinus pain/pressure, sore throat, rhinorrhea, SOB, wheezing, chest tightness.  Denies fever, dizziness, nausea, changes in bowel or bladder habits.    Patient also complains of chest pain 2 days ago and gotten worse. Left side of chest.  Intermittent and squeezing sensation for 2-3 minutes. Worse with walking. Symptoms do not improve with medications.  Does not feel like PE. Complains of lightheadedness.  Denies recent travel, recent surgery, travel, hormone use, tobacco use, or calf pain.    ROS: As per HPI.  Past Medical History:  Diagnosis Date  . Anxiety    doing good now  . Asthma   . Headache(784.0)   . HSV infection   . Infection    UTI  . Lupus (HCC)    dx age 34  . Pulmonary embolism (HCC) 06/4/82011  . STD (sexually transmitted disease) 02/2009   POSITIVE GC   Past Surgical History:  Procedure Laterality Date  . MOUTH SURGERY     2 teeth removed- 1 wisdom and 1 in front of it  . TONSILLECTOMY AND ADENOIDECTOMY  1998  . WISDOM TOOTH EXTRACTION     Allergies  Allergen Reactions  . Bactrim Anaphylaxis and Hives  . Hydrocodone-Acetaminophen Anaphylaxis  . Nifedipine Swelling and Anaphylaxis    Swelling of tongue  . Pineapple Itching  . Prednisone Anaphylaxis and Hives    Has been on Dexamethasone without issue.  . Shellfish Allergy Itching  . Sulfamethoxazole-Trimethoprim  Anaphylaxis  . Nsaids Other (See Comments)    Has Lupus, reccommended to avoid NSAIDs  . Other Itching    Shrimp:throat itching "can eat other shellfish"  . Vicodin [Hydrocodone-Acetaminophen] Hives and Swelling    Can take Percocet without difficulty   No current facility-administered medications on file prior to encounter.    Current Outpatient Medications on File Prior to Encounter  Medication Sig Dispense Refill  . albuterol (PROVENTIL HFA;VENTOLIN HFA) 108 (90 Base) MCG/ACT inhaler Inhale 2 puffs into the lungs every 6 (six) hours as needed for wheezing or shortness of breath.    Marland Kitchen apixaban (ELIQUIS) 5 MG TABS tablet Take 1 tablet (5 mg total) by mouth 2 (two) times daily. 60 tablet 11  . hydroxychloroquine (PLAQUENIL) 200 MG tablet Take 200 mg by mouth daily.    . methylPREDNISolone (MEDROL) 4 MG tablet Take 4 mg by mouth daily.    Marland Kitchen acetaminophen (TYLENOL) 500 MG tablet Take 1,000 mg by mouth every 6 (six) hours as needed for headache (pain).     Marland Kitchen albuterol (PROVENTIL) (2.5 MG/3ML) 0.083% nebulizer solution Take 2.5 mg by nebulization every 6 (six) hours as needed for wheezing or shortness of breath.    . meloxicam (MOBIC) 7.5 MG tablet Take 1 tablet (7.5 mg total) by mouth 2 (two) times daily. (Patient taking differently: Take 7.5 mg by mouth daily as needed for pain. ) 60 tablet 0  . ondansetron (ZOFRAN ODT) 4 MG disintegrating tablet Take 1 tablet (  4 mg total) by mouth every 8 (eight) hours as needed for nausea or vomiting. 15 tablet 0  . traMADol (ULTRAM) 50 MG tablet Take 1 tablet (50 mg total) by mouth every 6 (six) hours as needed. (Patient taking differently: Take 50 mg by mouth every 6 (six) hours as needed for moderate pain or severe pain. ) 15 tablet 0    Social History   Socioeconomic History  . Marital status: Single    Spouse name: Not on file  . Number of children: Not on file  . Years of education: Not on file  . Highest education level: Not on file    Occupational History  . Not on file  Social Needs  . Financial resource strain: Not on file  . Food insecurity:    Worry: Not on file    Inability: Not on file  . Transportation needs:    Medical: Not on file    Non-medical: Not on file  Tobacco Use  . Smoking status: Never Smoker  . Smokeless tobacco: Never Used  Substance and Sexual Activity  . Alcohol use: No  . Drug use: No  . Sexual activity: Yes    Partners: Male    Birth control/protection: None  Lifestyle  . Physical activity:    Days per week: Not on file    Minutes per session: Not on file  . Stress: Not on file  Relationships  . Social connections:    Talks on phone: Not on file    Gets together: Not on file    Attends religious service: Not on file    Active member of club or organization: Not on file    Attends meetings of clubs or organizations: Not on file    Relationship status: Not on file  . Intimate partner violence:    Fear of current or ex partner: Not on file    Emotionally abused: Not on file    Physically abused: Not on file    Forced sexual activity: Not on file  Other Topics Concern  . Not on file  Social History Narrative  . Not on file   Family History  Problem Relation Age of Onset  . Diabetes Maternal Aunt   . Cancer Maternal Grandmother 72       COLON CA  . Diabetes Maternal Grandmother   . Hypertension Maternal Grandfather   . Cancer Maternal Grandfather        prostate  . Asthma Mother   . Asthma Brother   . Cancer Paternal Grandmother        breast  . Lupus Paternal Grandmother   . Anesthesia problems Neg Hx   . Hypotension Neg Hx   . Malignant hyperthermia Neg Hx   . Pseudochol deficiency Neg Hx      OBJECTIVE:  Vitals:   04/16/18 1758  BP: (!) 144/96  Pulse: 91  Temp: 98.3 F (36.8 C)  TempSrc: Oral  SpO2: 100%     General appearance: AOx3 in no acute distress; nontoxic appearance; speaking in full sentences without difficulty HEENT: PERRL.  EOM grossly  intact.  Sinuses nontender; no rhinorrhea; tonsils nonerythematous, uvula midline Neck: supple without LAD Lungs: clear to auscultation bilaterally without adventitious breath sounds; unable to reproduce chest tenderness with palpation Heart: regular rate and rhythm.  Radial pulses 2+ symmetrical bilaterally Skin: warm and dry Psychological: alert and cooperative; normal mood and affect  EKG:  EKG normal sinus rhythm without ST elevations, depressions, or prolonged PR interval.  No narrowing or widening of the QRS complexes.  Comparable with previous EKG.  Reviewed EKG with Dr. Tracie HarrierHagler.     ASSESSMENT & PLAN:  1. Other chest pain   2. Shortness of breath   3. URI with cough and congestion     No orders of the defined types were placed in this encounter.  Cannot rule out cardiac cause of symptoms.   Recommend further evaluation and management in the ED Patient aware and in agreement with this plan.    Reviewed expectations re: course of current medical issues. Questions answered. Outlined signs and symptoms indicating need for more acute intervention. Patient verbalized understanding. After Visit Summary given.          Rennis HardingWurst, Danea Manter, PA-C 04/16/18 1914

## 2018-04-16 NOTE — ED Provider Notes (Signed)
Patient placed in Quick Look pathway, seen and evaluated   Chief Complaint: SOB  HPI:  Patient has a history of lupus and PE presents with cough, congestion, left sided pleuritic non-radiating chest pain and exertional shortness of breath. Symptoms started two days ago. Of note, she ran out of her eliquis prescription a week ago and has not been taking it. She denies leg swelling or calf pain. Has had chills, no measured temperature. No sick contacts with similar symptoms.   ROS: No hemoptysis  Physical Exam:   Gen: No distress  Neuro: Awake and Alert  Skin: Warm    Focused Exam: Lungs CTA.  Initiation of care has begun. The patient has been counseled on the process, plan, and necessity for staying for the completion/evaluation, and the remainder of the medical screening examination    Lawrence MarseillesShrosbree, Tully Mcinturff J, PA-C 04/16/18 1945    Melene PlanFloyd, Dan, DO 04/16/18 2222

## 2018-04-16 NOTE — ED Triage Notes (Signed)
Pt reports nasal congestion and drainage, cough since & SOB Sunday.  She also reports intermittent left sided chest tightness and squeezing since Sunday.  Pt has a hx of PE but states the pain is not the same as before.

## 2018-04-16 NOTE — Telephone Encounter (Signed)
Pt calling with complaints of shortness of breath since Sunday night. Pt states she also has cold like symptoms. Pt states she is short of breath even with sitting and is currently speaking in phrases while on the phone with triage nurse. Pt states she has used her inhaler 12 times today with no improvement. Pt advised to seek care in the ED for current symptoms. Pt verbalized understanding.  Reason for Disposition . [1] MODERATE difficulty breathing (e.g., speaks in phrases, SOB even at rest, pulse 100-120) AND [2] NEW-onset or WORSE than normal  Answer Assessment - Initial Assessment Questions 1. RESPIRATORY STATUS: "Describe your breathing?" (e.g., wheezing, shortness of breath, unable to speak, severe coughing)      Shortness of breath, speaking in phrases while talking to triage nurse  2. ONSET: "When did this breathing problem begin?"      Sunday 3. PATTERN "Does the difficult breathing come and go, or has it been constant since it started?"      Constant 4. SEVERITY: "How bad is your breathing?" (e.g., mild, moderate, severe)    - MILD: No SOB at rest, mild SOB with walking, speaks normally in sentences, can lay down, no retractions, pulse < 100.    - MODERATE: SOB at rest, SOB with minimal exertion and prefers to sit, cannot lie down flat, speaks in phrases, mild retractions, audible wheezing, pulse 100-120.    - SEVERE: Very SOB at rest, speaks in single words, struggling to breathe, sitting hunched forward, retractions, pulse > 120      Moderate 5. RECURRENT SYMPTOM: "Have you had difficulty breathing before?" If so, ask: "When was the last time?" and "What happened that time?"      Yes 6. CARDIAC HISTORY: "Do you have any history of heart disease?" (e.g., heart attack, angina, bypass surgery, angioplasty)      Not assessed 7. LUNG HISTORY: "Do you have any history of lung disease?"  (e.g., pulmonary embolus, asthma, emphysema)     Yes has used inhaler 12 times today, hx of PE 8.  CAUSE: "What do you think is causing the breathing problem?"      Not assessed 9. OTHER SYMPTOMS: "Do you have any other symptoms? (e.g., dizziness, runny nose, cough, chest pain, fever)     Pt states she has had a cold since Sunday night 10. PREGNANCY: "Is there any chance you are pregnant?" "When was your last menstrual period?"       Not assessed 11. TRAVEL: "Have you traveled out of the country in the last month?" (e.g., travel history, exposures)       Not assessed  Protocols used: BREATHING DIFFICULTY-A-AH

## 2018-04-17 MED ORDER — BENZONATATE 100 MG PO CAPS: 100.0000 mg | ORAL_CAPSULE | Freq: Three times a day (TID) | ORAL | 0 refills | Status: DC

## 2018-04-17 MED ORDER — IPRATROPIUM-ALBUTEROL 0.5-2.5 (3) MG/3ML IN SOLN
3.0000 mL | Freq: Once | RESPIRATORY_TRACT | Status: AC
  Administered 2018-04-17: 3 mL via RESPIRATORY_TRACT
  Filled 2018-04-17: qty 3

## 2018-04-17 NOTE — ED Notes (Signed)
Reviewed d/c instructions with pt, who verbalized understanding and had no outstanding questions. Pt departed in NAD, refused use of wheelchair.   

## 2018-04-19 ENCOUNTER — Ambulatory Visit: Payer: Self-pay | Admitting: *Deleted

## 2018-04-19 NOTE — Telephone Encounter (Signed)
Attempted to contact pt; left message on voicemail [phone]; unable to complete nurse triage at this time.

## 2018-04-19 NOTE — Telephone Encounter (Signed)
Pt called back stating that she is not having chest pain but she is still coughing a lot; she says "her body is starting to be in more pain"; the pt says that the tessalon pearls prescribed per ED only last about an hour; she would like to set up a follow up appointment; recommendations made per nurse triage protocol; the pt is normally seen Dr Collie Siad but she has no availability; spoke with Almira Coaster and pt was offered and accepted appointment with Dr Collie Siad, [facility] [address], on 04/22/18 at 1120; the pt verbalizes understanding.  Reason for Disposition . [1] MODERATE longstanding difficulty breathing (e.g., speaks in phrases, SOB even at rest, pulse 100-120) AND [2] SAME as normal  Answer Assessment - Initial Assessment Questions 1. REASON FOR CALL or QUESTION: "What is your reason for calling today?" or "How can I best help you?" or "What question do you have that I can help answer?"     Would like to schedule hospital follow up appointment  Answer Assessment - Initial Assessment Questions 1. RESPIRATORY STATUS: "Describe your breathing?" (e.g., wheezing, shortness of breath, unable to speak, severe coughing)      Coughing, esp when taking a deep breath 2. ONSET: "When did this breathing problem begin?"      04/14/18 3. PATTERN "Does the difficult breathing come and go, or has it been constant since it started?"      constant 4. SEVERITY: "How bad is your breathing?" (e.g., mild, moderate, severe)    - MILD: No SOB at rest, mild SOB with walking, speaks normally in sentences, can lay down, no retractions, pulse < 100.    - MODERATE: SOB at rest, SOB with minimal exertion and prefers to sit, cannot lie down flat, speaks in phrases, mild retractions, audible wheezing, pulse 100-120.    - SEVERE: Very SOB at rest, speaks in single words, struggling to breathe, sitting hunched forward, retractions, pulse > 120    moderate 5. RECURRENT SYMPTOM: "Have you had difficulty breathing before?" If  so, ask: "When was the last time?" and "What happened that time?"      Seen in ED and urgent care 04/16/18 6. CARDIAC HISTORY: "Do you have any history of heart disease?" (e.g., heart attack, angina, bypass surgery, angioplasty)      no 7. LUNG HISTORY: "Do you have any history of lung disease?"  (e.g., pulmonary embolus, asthma, emphysema)     PE and asthma 8. CAUSE: "What do you think is causing the breathing problem?"      Seen in urgent care and ED on 04/16/18 9. OTHER SYMPTOMS: "Do you have any other symptoms? (e.g., dizziness, runny nose, cough, chest pain, fever)     Cough, dizziness, intermittent fever 100.3 on pm 04/18/18 10. PREGNANCY: "Is there any chance you are pregnant?" "When was your last menstrual period?"      No LMP 04/18/18 11. TRAVEL: "Have you traveled out of the country in the last month?" (e.g., travel history, exposures)       no  Protocols used: BREATHING DIFFICULTY-A-AH, INFORMATION ONLY CALL-A-AH

## 2018-04-21 NOTE — Progress Notes (Signed)
Chief Complaint  Patient presents with  . Hospitalization Follow-up    per pt she passed out last night for a few seconds. Declines flu shot.  Per pt she is having pain 6/10 in llq and pain 5/10 in left knee.  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  Dizziness She reports that last night she was at home and felt dizzy then she passed out Her partner witnessed this and told her she was out for 4 seconds Prior to loss of consciousness she was feeling dizzy so she called out. [name] heard her call out and went immediately and heard a large thump when he saw her on the ground  She was responsive but was cloudy She was able to move but just could not focus on anything  Left knee pain She is currently having left hip and knee pains She reports that her left knee hurt from falling at home  She is able to walk but the left knee is sore  Weight loss and poor PO intake She has not been able to have her usual meals or drinking for a week She has poor appetite and has been vomiting with meals Now she is doing water, jello, apple sauce and broth  Wt Readings from Last 3 Encounters:  04/22/18 (!) 324 lb 3.2 oz (147.1 kg)  03/06/18 (!) 332 lb (150.6 kg)  02/28/18 (!) 340 lb (154.2 kg)  Body mass index is 46.41 kg/m.   Today she has continued blurry vision and dizziness She states that she was noted to be stumbling this morning She reports that she has been having hot and cold chills this week  Depression screen [facility] Dt 2/9 04/22/2018 02/27/2018 11/26/2017 04/04/2017 03/22/2017  Decreased Interest 0 0 0 0 0  Down,  Depressed, Hopeless 0 0 0 0 0  PHQ - 2 Score 0 0 0 0 0      Past Medical History:  Diagnosis Date  . Anxiety    doing good now  . Asthma   . Headache(784.0)   . HSV infection   . Infection    UTI  . Lupus (HCC)    dx age 58  . Pulmonary embolism (HCC) 06/4/82011  . STD (sexually transmitted disease) 02/2009   POSITIVE GC    Current Outpatient Medications  Medication Sig Dispense Refill  . albuterol (PROVENTIL HFA;VENTOLIN HFA) 108 (90 Base) MCG/ACT inhaler Inhale 2 puffs into the lungs every 6 (six) hours as needed for wheezing or shortness of breath.    Marland Kitchen albuterol (PROVENTIL) (2.5 MG/3ML) 0.083% nebulizer solution Take 2.5 mg by nebulization every 6 (six) hours as needed for wheezing or shortness of breath.    Marland Kitchen apixaban (ELIQUIS) 5 MG TABS tablet Take 1 tablet (5 mg total) by mouth 2 (two) times daily. 60 tablet 11  . benzonatate (TESSALON) 100 MG capsule Take 1 capsule (100 mg total) by mouth every 8 (eight) hours. 21 capsule 0  . hydroxychloroquine (PLAQUENIL) 200 MG tablet Take 200 mg by mouth daily.    . meloxicam (MOBIC) 7.5 MG tablet Take 1 tablet (7.5  mg total) by mouth 2 (two) times daily. 60 tablet 0  . methylPREDNISolone (MEDROL) 4 MG tablet Take 4 mg by mouth daily.    . ondansetron (ZOFRAN ODT) 4 MG disintegrating tablet Take 1 tablet (4 mg total) by mouth every 8 (eight) hours as needed for nausea or vomiting. 15 tablet 0  . traMADol (ULTRAM) 50 MG tablet Take 1 tablet (50 mg total) by mouth every 6 (six) hours as needed. 15 tablet 0   No current facility-administered medications for this visit.     Allergies:  Allergies  Allergen Reactions  . Bactrim Anaphylaxis and Hives  . Hydrocodone-Acetaminophen Anaphylaxis  . Nifedipine Swelling and Anaphylaxis    Swelling of tongue  . Pineapple Itching  . Prednisone Anaphylaxis and Hives    Has been on Dexamethasone without issue.  . Shellfish Allergy Itching  . Sulfamethoxazole-Trimethoprim Anaphylaxis  .  Nsaids Other (See Comments)    Has Lupus, reccommended to avoid NSAIDs  . Other Itching    Shrimp:throat itching "can eat other shellfish"  . Vicodin [Hydrocodone-Acetaminophen] Hives and Swelling    Can take Percocet without difficulty    Past Surgical History:  Procedure Laterality Date  . MOUTH SURGERY     2 teeth removed- 1 wisdom and 1 in front of it  . TONSILLECTOMY AND ADENOIDECTOMY  1998  . WISDOM TOOTH EXTRACTION      Social History   Socioeconomic History  . Marital status: Single    Spouse name: Not on file  . Number of children: Not on file  . Years of education: Not on file  . Highest education level: Not on file  Occupational History  . Not on file  Social Needs  . Financial resource strain: Not on file  . Food insecurity:    Worry: Not on file    Inability: Not on file  . Transportation needs:    Medical: Not on file    Non-medical: Not on file  Tobacco Use  . Smoking status: Never Smoker  . Smokeless tobacco: Never Used  Substance and Sexual Activity  . Alcohol use: No  . Drug use: No  . Sexual activity: Yes    Partners: Male    Birth control/protection: None  Lifestyle  . Physical activity:    Days per week: Not on file    Minutes per session: Not on file  . Stress: Not on file  Relationships  . Social connections:    Talks on phone: Not on file    Gets together: Not on file    Attends religious service: Not on file    Active member of club or organization: Not on file    Attends meetings of clubs or organizations: Not on file    Relationship status: Not on file  Other Topics Concern  . Not on file  Social History Narrative  . Not on file    Family History  Problem Relation Age of Onset  . Diabetes Maternal Aunt   . Cancer Maternal Grandmother 72       COLON CA  . Diabetes Maternal Grandmother   . Hypertension Maternal Grandfather   . Cancer Maternal Grandfather        prostate  . Asthma Mother   . Asthma Brother   . Cancer  Paternal Grandmother        breast  . Lupus Paternal Grandmother   . Anesthesia problems Neg Hx   . Hypotension Neg Hx   . Malignant hyperthermia Neg Hx   .  Pseudochol deficiency Neg Hx      ROS Review of Systems See HPI Constitution: No fevers or chills No malaise No diaphoresis Skin: No rash or itching Eyes: no blurry vision, no double vision GU: no dysuria or hematuria Neuro: + dizziness, + headaches all others reviewed and negative   Objective: Vitals:   04/22/18 1141  BP: (!) 141/99  Pulse: 86  Resp: 16  Temp: 98.1 F (36.7 C)  TempSrc: Oral  SpO2: 98%  Weight: (!) 324 lb 3.2 oz (147.1 kg)  Height: 5' 10.08" (1.78 m)    Physical Exam  Constitutional: She is oriented to person, place, and time. She appears well-developed and well-nourished.  HENT:  Head: Normocephalic and atraumatic.  Eyes: Conjunctivae and EOM are normal.  Neck: Normal range of motion. Neck supple.  Cardiovascular: Normal rate, regular rhythm and normal heart sounds.  No murmur heard. Pulmonary/Chest: Effort normal and breath sounds normal. No stridor. No respiratory distress. She has no wheezes.  Abdominal: Soft. Bowel sounds are normal. She exhibits no distension and no mass. There is no tenderness. There is no rebound and no guarding.  Neurological: She is alert and oriented to person, place, and time.  Skin: Skin is warm. Capillary refill takes less than 2 seconds.  Psychiatric: She has a normal mood and affect. Her behavior is normal. Judgment and thought content normal.   Left knee tender along the patellar tendon No effusion No edema No clicking  CLINICAL DATA:  Intermittent left-sided chest pain and dyspnea x3 days  EXAM: CT ANGIOGRAPHY CHEST WITH CONTRAST  TECHNIQUE: Multidetector CT imaging of the chest was performed using the standard protocol during bolus administration of intravenous contrast. Multiplanar CT image reconstructions and MIPs were obtained to evaluate  the vascular anatomy.  CONTRAST:  ISOVUE-370 IOPAMIDOL (ISOVUE-370) INJECTION 76%  COMPARISON:  04/04/2017  FINDINGS: Cardiovascular: The study is adequate for the evaluation of pulmonary embolism only to the distal lobar level due to suboptimal opacification beyond this. No large central pulmonary embolus. Heart size is normal without pericardial effusion. Great vessels are normal in course and caliber. Borderline enlarged heart size. No significant pericardial fluid/thickening. Nonaneurysmal thoracic aorta. No apparent aortic dissection.  Mediastinum/Nodes: No discrete thyroid nodules. Unremarkable esophagus. No pathologically enlarged axillary, mediastinal or hilar lymph nodes.  Lungs/Pleura: No pneumothorax. No pleural effusion. Minimal subsegmental atelectasis the left lower lobe anteriorly.  Upper abdomen: Unremarkable.  Musculoskeletal:  No aggressive appearing focal osseous lesions.  Review of the MIP images confirms the above findings.  IMPRESSION: No large central pulmonary embolus.  No active pulmonary disease.   Electronically Signed   By: Tollie Eth M.D.   On: 04/16/2018 22:41  Study Conclusions 02/12/18  - Left ventricle: The cavity size was normal. Wall thickness was   increased in a pattern of moderate LVH. Systolic function was   vigorous. The estimated ejection fraction was in the range of 65%   to 70%. Wall motion was normal; there were no regional wall   motion abnormalities. Left ventricular diastolic function   parameters were normal. - Left atrium: The atrium was normal in size. - Right atrium: The atrium was normal in size. - Tricuspid valve: There was no significant regurgitation. - Pulmonic valve: Borderline mild stenosis - mean peak gradient of   9 mmHg, mean gradient of 4. Peak gradient (S): 9 mm Hg. - Inferior vena cava: The vessel was normal in size. The   respirophasic diameter changes were in the normal range (>=  50%),   consistent with normal central venous pressure.  Impressions:  - Compared to a prior study in 12/2016, the LVEF is higher - there   is borderline elevated PV velocity, which may be related to   increased cardiac output or less likely mild stenosis.   Assessment and Plan Jaeley was seen today for hospitalization follow-up.  Diagnoses and all orders for this visit:  Acute upper respiratory infection- discussed OTC meds Avoiding dextromorphan and phenylephrine  Anticoagulated- continue the Eliquis  Obesity, Class III, BMI 40-49.9 (morbid obesity) (HCC)- improving due to poor PO intake, Discussed adding protein powder to fortify meals  Intermittent chest pain- reviewed ECHO, EKG and lab Her symptoms are chronic  No new findings on CT chest   Pre-syncope -  Based on description from witness the episode it seems more like presyncope Advised increased PO intake   Left knee pain - advised ice pack or biofreeze  Work note provided   [employer]

## 2018-04-22 ENCOUNTER — Ambulatory Visit (INDEPENDENT_AMBULATORY_CARE_PROVIDER_SITE_OTHER): Payer: 59 | Admitting: Family Medicine

## 2018-04-22 ENCOUNTER — Other Ambulatory Visit: Payer: Self-pay

## 2018-04-22 ENCOUNTER — Encounter: Payer: Self-pay | Admitting: Family Medicine

## 2018-04-22 VITALS — BP 141/99 | HR 86 | Temp 98.1°F | Resp 16 | Ht 70.08 in | Wt 324.2 lb

## 2018-04-22 DIAGNOSIS — J069 Acute upper respiratory infection, unspecified: Secondary | ICD-10-CM

## 2018-04-22 DIAGNOSIS — R079 Chest pain, unspecified: Secondary | ICD-10-CM

## 2018-04-22 DIAGNOSIS — Z7901 Long term (current) use of anticoagulants: Secondary | ICD-10-CM

## 2018-04-22 DIAGNOSIS — R55 Syncope and collapse: Secondary | ICD-10-CM

## 2018-04-22 DIAGNOSIS — M25562 Pain in left knee: Secondary | ICD-10-CM

## 2018-04-22 NOTE — Patient Instructions (Addendum)
Start the day with one serving of metamucil 1 tablespoon in a glass of water then drink a second glass of water Eat a small breakfast then drink water  Mid-morning eat a small serving of fruit then drink water  Lunch eat a small lunch with protein such as yogurt with protein powder stirred in or tuna and crackers  Mid afternoon eat some carrot sticks  Before dinner drink metamucil a get then eat your Evening dinner   OVER THE COUNTER COLD MEDICATIONS  If you have thyroid disease, diabetes, hypertension, tachycardia or seizure disorder If you take heart meds or ADD meds   You should not take Dextromorphan or Phenylephrine These are common found in decongestant and cold medications This can cause very high pulses and high blood pressure.  Ask you pharmacist to make sure this ingredient is not present   If you have lab work done today you will be contacted with your lab results within the next 2 weeks.  If you have not heard from [place] then please contact us. The fastest way to get your results is to register for My Chart.   IF you received an x-ray today, you will receive an invoice from [facility]. Please contact [facility] at [phone] with questions or concerns regarding your invoice.   IF you received labwork today, you will receive an invoice from [facility]. Please contact [facility] at [phone] with questions or concerns regarding your invoice.   Our billing staff will not be able to assist you with questions regarding bills from these companies.  You will be contacted with the lab results as soon as they are available. The fastest way to get your results is to activate your My Chart account. Instructions are located on the last page of this paperwork. If you have not heard from [place] regarding the results in 2 weeks, please contact this office.

## 2018-04-24 ENCOUNTER — Telehealth: Payer: Self-pay | Admitting: Family Medicine

## 2018-04-24 NOTE — Telephone Encounter (Unsigned)
Copied from CRM [id number]. Topic: Quick Communication - See Telephone Encounter >> Apr 24, 2018  3:50 PM Herby Abraham C wrote: CRM for notification. See Telephone encounter for: 04/24/18.  Pt called in to be advised. She said that she was written out of work from 02/21/18-04/07/18. Pt says that she need a copy of the documentation that was provided for this, pt has to appeal with her job. Pt also need a copy of labs and medical summary.   CB: [phone]

## 2018-04-25 NOTE — Telephone Encounter (Signed)
Spoke with pt via phone advised she will need to sign a release of info form to obtain copies of labs, ov's and fmla form. Requested info printed and left up front with Felicia with ROI attached to be signed.  Pt agreeable. Dgaddy, CMA  Copied fmla form, august ov's and august labs only.

## 2018-04-29 ENCOUNTER — Inpatient Hospital Stay: Payer: 59

## 2018-04-29 ENCOUNTER — Inpatient Hospital Stay: Payer: 59 | Attending: Hematology and Oncology | Admitting: Hematology and Oncology

## 2018-04-29 NOTE — Assessment & Plan Note (Deleted)
Pulmonary embolism diagnosed 12/30/2016 when she presented with chest pain and leg pains: Right lower lobe pulmonary artery Initial testing was positive for lupus anticoagulant but repeat testing was negative.  Current treatment: Eliquis (previously was treated with Coumadin but she did not like taking it because of inconvenience)  Chest pains:Other differential diagnosis could include lupus related pleuritic pain Or it could be scar tissue from the resolved previous blood clot related lung injury  Recommendation: I discussed with her about switching back to Coumadin but she was not interested in that.  We do not have great data for Eliquis and antiphospholipid antibody syndrome.  Considering that the d-dimer was negative, I recommended long-term anticoagulation therapy for her.  With her frequent chest pains, I did not recommend discontinuation of anticoagulation at this time.  Return to clinic annually for follow-up on Eliquis.

## 2018-05-23 ENCOUNTER — Other Ambulatory Visit: Payer: Self-pay | Admitting: Obstetrics and Gynecology

## 2018-05-23 DIAGNOSIS — R112 Nausea with vomiting, unspecified: Secondary | ICD-10-CM

## 2018-05-29 ENCOUNTER — Other Ambulatory Visit: Payer: 59

## 2018-05-29 ENCOUNTER — Ambulatory Visit: Payer: 59 | Admitting: Family Medicine

## 2018-05-31 ENCOUNTER — Telehealth: Payer: Self-pay | Admitting: Family Medicine

## 2018-05-31 NOTE — Telephone Encounter (Signed)
MyChart message sent to pt about FMLA forms °

## 2018-06-03 NOTE — Telephone Encounter (Signed)
Please see the following mychart message from patient. I am not sure what she is needing these forms completed for it looks as though she may have been in contact with Dr Creta Levin in regards to what she needs them for. I will place the blank forms in Dr Creta Levin box on 06/03/18 please return to the FMLA/Disability box at the checkout desk within 5-7 business days. Thank you!

## 2018-06-05 NOTE — Telephone Encounter (Signed)
Will work on it during the next scheduled clinic session

## 2018-06-06 ENCOUNTER — Encounter (HOSPITAL_COMMUNITY): Payer: Self-pay | Admitting: Advanced Practice Midwife

## 2018-06-06 ENCOUNTER — Inpatient Hospital Stay (HOSPITAL_COMMUNITY)
Admission: AD | Admit: 2018-06-06 | Discharge: 2018-06-07 | Disposition: A | Discharge status: Home / Self Care | Payer: 59 | Source: Ambulatory Visit | Attending: Obstetrics & Gynecology | Admitting: Obstetrics & Gynecology

## 2018-06-06 ENCOUNTER — Other Ambulatory Visit: Payer: Self-pay

## 2018-06-06 ENCOUNTER — Inpatient Hospital Stay (HOSPITAL_COMMUNITY): Payer: 59

## 2018-06-06 ENCOUNTER — Telehealth: Payer: Self-pay | Admitting: Family Medicine

## 2018-06-06 DIAGNOSIS — R109 Unspecified abdominal pain: Secondary | ICD-10-CM | POA: Diagnosis not present

## 2018-06-06 DIAGNOSIS — O26891 Other specified pregnancy related conditions, first trimester: Secondary | ICD-10-CM | POA: Diagnosis not present

## 2018-06-06 DIAGNOSIS — O3680X0 Pregnancy with inconclusive fetal viability, not applicable or unspecified: Secondary | ICD-10-CM

## 2018-06-06 DIAGNOSIS — O26899 Other specified pregnancy related conditions, unspecified trimester: Secondary | ICD-10-CM

## 2018-06-06 DIAGNOSIS — Z3A01 Less than 8 weeks gestation of pregnancy: Secondary | ICD-10-CM | POA: Diagnosis not present

## 2018-06-06 DIAGNOSIS — R102 Pelvic and perineal pain: Secondary | ICD-10-CM | POA: Diagnosis not present

## 2018-06-06 LAB — URINALYSIS, ROUTINE W REFLEX MICROSCOPIC
Bilirubin Urine: NEGATIVE
Glucose, UA: NEGATIVE mg/dL
Ketones, ur: NEGATIVE mg/dL
Leukocytes, UA: NEGATIVE
Nitrite: NEGATIVE
PH: 6 (ref 5.0–8.0)
Protein, ur: NEGATIVE mg/dL
SPECIFIC GRAVITY, URINE: 1.019 (ref 1.005–1.030)

## 2018-06-06 LAB — CBC
HEMATOCRIT: 35.7 % — AB (ref 36.0–46.0)
Hemoglobin: 11.4 g/dL — ABNORMAL LOW (ref 12.0–15.0)
MCH: 26.7 pg (ref 26.0–34.0)
MCHC: 31.9 g/dL (ref 30.0–36.0)
MCV: 83.6 fL (ref 80.0–100.0)
NRBC: 0 % (ref 0.0–0.2)
PLATELETS: 389 10*3/uL (ref 150–400)
RBC: 4.27 MIL/uL (ref 3.87–5.11)
RDW: 15.3 % (ref 11.5–15.5)
WBC: 6.5 10*3/uL (ref 4.0–10.5)

## 2018-06-06 LAB — WET PREP, GENITAL
CLUE CELLS WET PREP: NONE SEEN
Sperm: NONE SEEN
TRICH WET PREP: NONE SEEN
Yeast Wet Prep HPF POC: NONE SEEN

## 2018-06-06 LAB — POCT PREGNANCY, URINE: PREG TEST UR: POSITIVE — AB

## 2018-06-06 LAB — HCG, QUANTITATIVE, PREGNANCY: hCG, Beta Chain, Quant, S: 785 m[IU]/mL — ABNORMAL HIGH (ref <5)

## 2018-06-06 NOTE — MAU Note (Signed)
Urine in Lab 

## 2018-06-06 NOTE — Discharge Instructions (Signed)
Abdominal Pain During Pregnancy °Abdominal pain is common in pregnancy. Most of the time, it does not cause harm. There are many causes of abdominal pain. Some causes are more serious than others and sometimes the cause is not known. Abdominal pain can be a sign that something is very wrong with the pregnancy or the pain may have nothing to do with the pregnancy. Always tell your health care provider if you have any abdominal pain. °Follow these instructions at home: °· Do not have sex or put anything in your vagina until your symptoms go away completely. °· Watch your abdominal pain for any changes. °· Get plenty of rest until your pain improves. °· Drink enough fluid to keep your urine clear or pale yellow. °· Take over-the-counter or prescription medicines only as told by your health care provider. °· Keep all follow-up visits as told by your health care provider. This is important. °Contact a health care provider if: °· You have a fever. °· Your pain gets worse or you have cramping. °· Your pain continues after resting. °Get help right away if: °· You are bleeding, leaking fluid, or passing tissue from the vagina. °· You have vomiting or diarrhea that does not go away. °· You have painful or bloody urination. °· You feel very weak or faint. °· You have shortness of breath. °· You develop a severe headache with abdominal pain. °· You have abnormal vaginal discharge with abdominal pain. °This information is not intended to replace advice given to you by your health care provider. Make sure you discuss any questions you have with your health care provider. °Document Released: 07/10/2005 Document Revised: 04/20/2016 Document Reviewed: 02/06/2013 °Elsevier Interactive Patient Education © 2018 [employer] ° °

## 2018-06-06 NOTE — Telephone Encounter (Signed)
Patient reports that she is needing FMLA She reports that she is pregnant and will be establishing to OB She would like the form faxed to [phone]  Put in the FMLA bin

## 2018-06-06 NOTE — MAU Provider Note (Signed)
Chief Complaint: Abdominal Pain   First Provider Initiated Contact with Patient 06/06/18 2212       CCOB  SUBJECTIVE HPI: Hayley Webb is a 32 y.o. G3P1011 at [redacted]w[redacted]d by LMP who presents to maternity admissions reporting abdominal pain since 9am.  Cannot describe it exactly . Some upper, some lower.  Has been trying to get pregnant.  Has not called office to tell them about this pain.  Sees Dr Stefano Gaul. . She denies vaginal bleeding, vaginal itching/burning, urinary symptoms, h/a, dizziness, n/v, or fever/chills.    Abdominal Pain  This is a new problem. The current episode started today. The problem occurs constantly. The problem has been unchanged. The pain is located in the generalized abdominal region. The pain is at a severity of 6/10. The abdominal pain does not radiate. Pertinent negatives include no anorexia, constipation, diarrhea, dysuria, fever, frequency, myalgias, nausea or vomiting. The pain is relieved by nothing. She has tried nothing for the symptoms.   RN Note Pt presents to MAU c/o abdominal pain that started around 0900 pt states that this is a constant pain that is a 6/10. Pt denies any vaginal bleeding but reports a clear discharge. LMP was OCT 26th 2019   Past Medical History:  Diagnosis Date  . Anxiety    doing good now  . Asthma   . Headache(784.0)   . HSV infection   . Infection    UTI  . Lupus (HCC)    dx age 8  . Pulmonary embolism (HCC) 06/4/82011  . STD (sexually transmitted disease) 02/2009   POSITIVE GC   Past Surgical History:  Procedure Laterality Date  . MOUTH SURGERY     2 teeth removed- 1 wisdom and 1 in front of it  . TONSILLECTOMY AND ADENOIDECTOMY  1998  . WISDOM TOOTH EXTRACTION     Social History   Socioeconomic History  . Marital status: Single    Spouse name: Not on file  . Number of children: Not on file  . Years of education: Not on file  . Highest education level: Not on file  Occupational History  . Not on file  Social  Needs  . Financial resource strain: Not on file  . Food insecurity:    Worry: Not on file    Inability: Not on file  . Transportation needs:    Medical: Not on file    Non-medical: Not on file  Tobacco Use  . Smoking status: Never Smoker  . Smokeless tobacco: Never Used  Substance and Sexual Activity  . Alcohol use: No  . Drug use: No  . Sexual activity: Yes    Partners: Male    Birth control/protection: None  Lifestyle  . Physical activity:    Days per week: Not on file    Minutes per session: Not on file  . Stress: Not on file  Relationships  . Social connections:    Talks on phone: Not on file    Gets together: Not on file    Attends religious service: Not on file    Active member of club or organization: Not on file    Attends meetings of clubs or organizations: Not on file    Relationship status: Not on file  . Intimate partner violence:    Fear of current or ex partner: Not on file    Emotionally abused: Not on file    Physically abused: Not on file    Forced sexual activity: Not on file  Other Topics  Concern  . Not on file  Social History Narrative  . Not on file   No current facility-administered medications on file prior to encounter.    Current Outpatient Medications on File Prior to Encounter  Medication Sig Dispense Refill  . albuterol (PROVENTIL HFA;VENTOLIN HFA) 108 (90 Base) MCG/ACT inhaler Inhale 2 puffs into the lungs every 6 (six) hours as needed for wheezing or shortness of breath.    Marland Kitchen albuterol (PROVENTIL) (2.5 MG/3ML) 0.083% nebulizer solution Take 2.5 mg by nebulization every 6 (six) hours as needed for wheezing or shortness of breath.    Marland Kitchen apixaban (ELIQUIS) 5 MG TABS tablet Take 1 tablet (5 mg total) by mouth 2 (two) times daily. 60 tablet 11  . benzonatate (TESSALON) 100 MG capsule Take 1 capsule (100 mg total) by mouth every 8 (eight) hours. 21 capsule 0  . hydroxychloroquine (PLAQUENIL) 200 MG tablet Take 200 mg by mouth daily.    .  meloxicam (MOBIC) 7.5 MG tablet Take 1 tablet (7.5 mg total) by mouth 2 (two) times daily. 60 tablet 0  . methylPREDNISolone (MEDROL) 4 MG tablet Take 4 mg by mouth daily.    . ondansetron (ZOFRAN ODT) 4 MG disintegrating tablet Take 1 tablet (4 mg total) by mouth every 8 (eight) hours as needed for nausea or vomiting. 15 tablet 0  . traMADol (ULTRAM) 50 MG tablet Take 1 tablet (50 mg total) by mouth every 6 (six) hours as needed. 15 tablet 0   Allergies  Allergen Reactions  . Bactrim Anaphylaxis and Hives  . Hydrocodone-Acetaminophen Anaphylaxis  . Nifedipine Swelling and Anaphylaxis    Swelling of tongue  . Pineapple Itching  . Prednisone Anaphylaxis and Hives    Has been on Dexamethasone without issue.  . Shellfish Allergy Itching  . Sulfamethoxazole-Trimethoprim Anaphylaxis  . Nsaids Other (See Comments)    Has Lupus, reccommended to avoid NSAIDs  . Other Itching    Shrimp:throat itching "can eat other shellfish"  . Vicodin [Hydrocodone-Acetaminophen] Hives and Swelling    Can take Percocet without difficulty    I have reviewed patient's Past Medical Hx, Surgical Hx, Family Hx, Social Hx, medications and allergies.   ROS:  Review of Systems  Constitutional: Negative for fever.  Gastrointestinal: Positive for abdominal pain. Negative for anorexia, constipation, diarrhea, nausea and vomiting.  Genitourinary: Negative for dysuria and frequency.  Musculoskeletal: Negative for myalgias.   Review of Systems  Other systems negative   Physical Exam  Physical Exam Patient Vitals for the past 24 hrs:  BP Temp Temp src Pulse Resp Height Weight  06/06/18 2211 139/79 - - (!) 105 - - -  06/06/18 2008 (!) 160/95 98.2 F (36.8 C) Oral (!) 105 18 5\' 8"  (1.727 m) (!) 154.1 kg   Constitutional: Well-developed, well-nourished female in no acute distress.  Cardiovascular: normal rate Respiratory: normal effort GI: Abd soft, non-tender. Pos BS x 4 MS: Extremities nontender, no edema,  normal ROM Neurologic: Alert and oriented x 4.  GU: Neg CVAT.  PELVIC EXAM: Cervix pink, visually closed, without lesion, scant white creamy discharge, vaginal walls and external genitalia normal Bimanual exam: Cervix 0/long/high, firm, anterior, neg CMT, uterus nontender, nonenlarged, adnexa without tenderness, enlargement, or mass   LAB RESULTS Results for orders placed or performed during the hospital encounter of 06/06/18 (from the past 24 hour(s))  Urinalysis, Routine w reflex microscopic     Status: Abnormal   Collection Time: 06/06/18  8:12 PM  Result Value Ref Range   Color,  Urine YELLOW YELLOW   APPearance CLEAR CLEAR   Specific Gravity, Urine 1.019 1.005 - 1.030   pH 6.0 5.0 - 8.0   Glucose, UA NEGATIVE NEGATIVE mg/dL   Hgb urine dipstick SMALL (A) NEGATIVE   Bilirubin Urine NEGATIVE NEGATIVE   Ketones, ur NEGATIVE NEGATIVE mg/dL   Protein, ur NEGATIVE NEGATIVE mg/dL   Nitrite NEGATIVE NEGATIVE   Leukocytes, UA NEGATIVE NEGATIVE   RBC / HPF 6-10 0 - 5 RBC/hpf   WBC, UA 0-5 0 - 5 WBC/hpf   Bacteria, UA RARE (A) NONE SEEN   Squamous Epithelial / LPF 0-5 0 - 5   Mucus PRESENT   Pregnancy, urine POC     Status: Abnormal   Collection Time: 06/06/18  8:20 PM  Result Value Ref Range   Preg Test, Ur POSITIVE (A) NEGATIVE  hCG, quantitative, pregnancy     Status: Abnormal   Collection Time: 06/06/18 10:03 PM  Result Value Ref Range   hCG, Beta Chain, Quant, S 785 (H) <5 mIU/mL  CBC     Status: Abnormal   Collection Time: 06/06/18 10:03 PM  Result Value Ref Range   WBC 6.5 4.0 - 10.5 K/uL   RBC 4.27 3.87 - 5.11 MIL/uL   Hemoglobin 11.4 (L) 12.0 - 15.0 g/dL   HCT 40.935.7 (L) 81.136.0 - 91.446.0 %   MCV 83.6 80.0 - 100.0 fL   MCH 26.7 26.0 - 34.0 pg   MCHC 31.9 30.0 - 36.0 g/dL   RDW 78.215.3 95.611.5 - 21.315.5 %   Platelets 389 150 - 400 K/uL   nRBC 0.0 0.0 - 0.2 %  Wet prep, genital     Status: Abnormal   Collection Time: 06/06/18 10:19 PM  Result Value Ref Range   Yeast Wet Prep HPF  POC NONE SEEN NONE SEEN   Trich, Wet Prep NONE SEEN NONE SEEN   Clue Cells Wet Prep HPF POC NONE SEEN NONE SEEN   WBC, Wet Prep HPF POC FEW (A) NONE SEEN   Sperm NONE SEEN     IMAGING [place] Pelvic Complete With Transvaginal  Result Date: 06/06/2018 CLINICAL DATA:  Initial evaluation for acute pelvic pain affecting pregnancy. EXAM: OBSTETRIC <14 WK US AND TRANSVAGINAL OB US DOPPLER ULTRASOUND OF OVARIES TECHNIQUE: Both transabdominal and transvaginal ultrasound examinations were performed for complete evaluation of the gestation as well as the maternal uterus, adnexal regions, and pelvic cul-de-sac. Transvaginal technique was performed to assess early pregnancy. COMPARISON:  None. FINDINGS: Intrauterine gestational sac: Single Yolk sac:  Not visualized. Embryo:  Not visualized. Cardiac Activity: N/A Heart Rate: N/A bpm MSD: 3.1 mm   5 w   0 d Subchorionic hemorrhage:  None visualized. Maternal uterus/adnexae: Ovaries normal in appearance bilaterally. Probable small corpus luteal cyst noted on the right. No free fluid within the pelvis. IMPRESSION: 1. Probable early intrauterine gestational sac, but no yolk sac, fetal pole, or cardiac activity yet visualized. Recommend follow-up quantitative B-HCG levels and follow-up US in 14 days to confirm and assess viability. This recommendation follows SRU consensus guidelines: Diagnostic Criteria for Nonviable Pregnancy Early in the First Trimester. Malva Limes Engl J Med 2013; 086:5784-69; 369:1443-51. 2. No other acute maternal uterine or adnexal abnormality identified. Electronically Signed   By: Rise MuBenjamin  McClintock M.D.   On: 06/06/2018 23:34    MAU Management/MDM: Ordered usual first trimester r/o ectopic labs.   Pelvic exam and cultures done Will check baseline Ultrasound to rule out ectopic.  This bleeding/pain can represent a normal pregnancy with bleeding,  spontaneous abortion or even an ectopic which can be life-threatening.  The process as listed above helps to determine  which of these is present.  US showed 5 week IUGS.  DIscussed it is too early to definitively rule out ectopic Need to repeat HCG then [place] .   ASSESSMENT Pregnancy at [redacted]w[redacted]d by LMP Abdominal pain in early pregnancy Pregnancy of unknown location  PLAN Discharge home Plan to repeat HCG level in 48 hours  Will repeat  Ultrasound in about 7-10 days if HCG levels double appropriately  Ectopic precautions  Pt stable at time of discharge. Encouraged to return here or to other Urgent Care/ED if she develops worsening of symptoms, increase in pain, fever, or other concerning symptoms.    Wynelle Bourgeois [name], MSN Certified Nurse-Midwife 06/06/2018  10:12 PM

## 2018-06-06 NOTE — MAU Note (Signed)
Pt presents to MAU c/o abdominal pain that started around 0900 pt states that this is a constant pain that is a 6/10. Pt denies any vaginal bleeding but reports a clear discharge. LMP was OCT 26th 2019.

## 2018-06-07 DIAGNOSIS — O26891 Other specified pregnancy related conditions, first trimester: Secondary | ICD-10-CM | POA: Diagnosis not present

## 2018-06-07 LAB — HIV ANTIBODY (ROUTINE TESTING W REFLEX): HIV SCREEN 4TH GENERATION: NONREACTIVE

## 2018-06-07 LAB — GC/CHLAMYDIA PROBE AMP (~~LOC~~) NOT AT ARMC
CHLAMYDIA, DNA PROBE: NEGATIVE
NEISSERIA GONORRHEA: NEGATIVE

## 2018-06-08 ENCOUNTER — Inpatient Hospital Stay (HOSPITAL_COMMUNITY)
Admission: AD | Admit: 2018-06-08 | Discharge: 2018-06-09 | Disposition: A | Discharge status: Home / Self Care | Payer: 59 | Source: Ambulatory Visit | Attending: Obstetrics and Gynecology | Admitting: Obstetrics and Gynecology

## 2018-06-08 DIAGNOSIS — O3680X0 Pregnancy with inconclusive fetal viability, not applicable or unspecified: Secondary | ICD-10-CM

## 2018-06-08 DIAGNOSIS — Z3A01 Less than 8 weeks gestation of pregnancy: Secondary | ICD-10-CM | POA: Diagnosis not present

## 2018-06-08 DIAGNOSIS — O0281 Inappropriate change in quantitative human chorionic gonadotropin (hCG) in early pregnancy: Secondary | ICD-10-CM | POA: Insufficient documentation

## 2018-06-08 NOTE — MAU Note (Signed)
Here for repeat BHCG. Having mild cramping on occ but no vaginal bleeding.

## 2018-06-09 DIAGNOSIS — O3680X0 Pregnancy with inconclusive fetal viability, not applicable or unspecified: Secondary | ICD-10-CM | POA: Diagnosis not present

## 2018-06-09 DIAGNOSIS — Z3A01 Less than 8 weeks gestation of pregnancy: Secondary | ICD-10-CM

## 2018-06-09 DIAGNOSIS — O0281 Inappropriate change in quantitative human chorionic gonadotropin (hCG) in early pregnancy: Secondary | ICD-10-CM

## 2018-06-09 LAB — HCG, QUANTITATIVE, PREGNANCY: hCG, Beta Chain, Quant, S: 2075 m[IU]/mL — ABNORMAL HIGH (ref <5)

## 2018-06-09 NOTE — Discharge Instructions (Signed)
Safe Medications in Pregnancy  ? ? ?Acne: ?Benzoyl Peroxide ?Salicylic Acid ? ?Backache/Headache: ?Tylenol: 2 regular strength every 4 hours OR ?             2 Extra strength every 6 hours ? ?Colds/Coughs/Allergies: ?Benadryl (alcohol free) 25 mg every 6 hours as needed ?Breath right strips ?Claritin ?Cepacol throat lozenges ?Chloraseptic throat spray ?Cold-Eeze- up to three times per day ?Cough drops, alcohol free ?Flonase (by prescription only) ?Guaifenesin ?Mucinex ?Robitussin DM (plain only, alcohol free) ?Saline nasal spray/drops ?Sudafed (pseudoephedrine) & Actifed ** use only after [redacted] weeks gestation and if you do not have high blood pressure ?Tylenol ?Vicks Vaporub ?Zinc lozenges ?Zyrtec  ? ?Constipation: ?Colace ?Ducolax suppositories ?Fleet enema ?Glycerin suppositories ?Metamucil ?Milk of magnesia ?Miralax ?Senokot ?Smooth move tea ? ?Diarrhea: ?Kaopectate ?Imodium A-D ? ?*NO pepto Bismol ? ?Hemorrhoids: ?Anusol ?Anusol HC ?Preparation H ?Tucks ? ?Indigestion: ?Tums ?Maalox ?Mylanta ?Zantac  ?Pepcid ? ?Insomnia: ?Benadryl (alcohol free) 25mg every 6 hours as needed ?Tylenol PM ?Unisom, no Gelcaps ? ?Leg Cramps: ?Tums ?MagGel ? ?Nausea/Vomiting:  ?Bonine ?Dramamine ?Emetrol ?Ginger extract ?Sea bands ?Meclizine  ?Nausea medication to take during pregnancy:  ?Unisom (doxylamine succinate 25 mg tablets) Take one tablet daily at bedtime. If symptoms are not adequately controlled, the dose can be increased to a maximum recommended dose of two tablets daily (1/2 tablet in the morning, 1/2 tablet mid-afternoon and one at bedtime). ?Vitamin B6 100mg tablets. Take one tablet twice a day (up to 200 mg per day). ? ?Skin Rashes: ?Aveeno products ?Benadryl cream or 25mg every 6 hours as needed ?Calamine Lotion ?1% cortisone cream ? ?Yeast infection: ?Gyne-lotrimin 7 ?Monistat 7 ? ? ?**If taking multiple medications, please check labels to avoid duplicating the same active ingredients ?**take  medication as directed on the label ?** Do not exceed 4000 mg of tylenol in 24 hours ?**Do not take medications that contain aspirin or ibuprofen ? ? ? ?You have constipation which is hard stools that are difficult to pass. It is important to have regular bowel movements every 1-3 days that are soft and easy to pass. Hard stools increase your risk of hemorrhoids and are very uncomfortable.  ? ?To prevent constipation you can increase the amount of fiber in your diet. Examples of foods with fiber are leafy greens, whole grain breads, oatmeal and other grains.  It is also important to drink at least eight 8oz glass of water everyday.  ? ?If you have not has a bowel movement in 4-5 days you made need to clean out your bowel.  This will have establish normal movement through your bowel.   ? ?Miralax Clean out ?Take 8 capfuls of miralax in 64 oz of gatorade. You can use any fluid that appeals to you (gatorade, water, juice) ?Continue to drink at least eight 8 oz glasses of water throughout the day ?You can repeat with another 8 capfuls of miralax in 64 oz of gatorade if you are not having a large amount of stools ?You will need to be at home and close to a bathroom for about 8 hours when you do the above as you may need to go to the bathroom frequently.  ? ?After you are cleaned out: ?- Start Colace100mg twice daily ?- Start Miralax once daily ?- Start a daily fiber supplement like metamucil or citrucel ?- You can safely use enemas in pregnancy  ?- if you are having diarrhea you can reduce to Colace once a day or miralax every other   day or a 1/2 capful daily.  ? ?

## 2018-06-09 NOTE — Progress Notes (Signed)
Written and verbal d/c instructions given and understanding vocied 

## 2018-06-09 NOTE — MAU Provider Note (Signed)
History   Chief Complaint:  Follow-up   Hayley Webb is  32 y.o. G3P1011 Patient's last menstrual period was 04/18/2018.Marland Kitchen. Patient is here for follow up of quantitative HCG and ongoing surveillance of pregnancy status.   She is 5244w3d weeks gestation  by LMP.    Since her last visit, the patient is without new complaint.   The patient reports bleeding as  none now.    General ROS:  negative  Her previous Quantitative HCG values are:   Physical Exam  Blood pressure 136/86, pulse 100, temperature 97.7 F (36.5 C), resp. rate 18, last menstrual period 04/18/2018. Focused Gynecological Exam: normal external genitalia, vulva, vagina, cervix, uterus and adnexa, examination not indicated  Labs: Results for orders placed or performed during the hospital encounter of 06/08/18 (from the past 24 hour(s))  hCG, quantitative, pregnancy   Collection Time: 06/08/18 11:01 PM  Result Value Ref Range   hCG, Beta Chain, Quant, S 2,075 (H) <5 mIU/mL    Ultrasound Studies:   [place] Pelvic Complete With Transvaginal  Result Date: 06/06/2018 CLINICAL DATA:  Initial evaluation for acute pelvic pain affecting pregnancy. EXAM: OBSTETRIC <14 WK US AND TRANSVAGINAL OB US DOPPLER ULTRASOUND OF OVARIES TECHNIQUE: Both transabdominal and transvaginal ultrasound examinations were performed for complete evaluation of the gestation as well as the maternal uterus, adnexal regions, and pelvic cul-de-sac. Transvaginal technique was performed to assess early pregnancy. COMPARISON:  None. FINDINGS: Intrauterine gestational sac: Single Yolk sac:  Not visualized. Embryo:  Not visualized. Cardiac Activity: N/A Heart Rate: N/A bpm MSD: 3.1 mm   5 w   0 d Subchorionic hemorrhage:  None visualized. Maternal uterus/adnexae: Ovaries normal in appearance bilaterally. Probable small corpus luteal cyst noted on the right. No free fluid within the pelvis. IMPRESSION: 1. Probable early intrauterine gestational sac, but no yolk sac, fetal  pole, or cardiac activity yet visualized. Recommend follow-up quantitative B-HCG levels and follow-up US in 14 days to confirm and assess viability. This recommendation follows SRU consensus guidelines: Diagnostic Criteria for Nonviable Pregnancy Early in the First Trimester. Malva Limes Engl J Med 2013; 147:8295-62; 369:1443-51. 2. No other acute maternal uterine or adnexal abnormality identified. Electronically Signed   By: Rise MuBenjamin  McClintock M.D.   On: 06/06/2018 23:34    Assessment: 3844w3d weeks gestation here for ongoing surveillance of pregnancy. Appropriate rising quants.    Plan: The patient is instructed to follow up in in the next few days. Discussed f/u with [name] Jade, patient should f/u this week.  Continue medications Strict return precautions  Pelvic rest   , Harolyn RutherfordJennifer I, NP 06/09/2018 7:56 AM

## 2018-06-09 NOTE — MAU Note (Signed)
Venia CarbonJennifer Rasch NP in Triage to discuss lab results and d/c plan with pt.

## 2018-06-10 ENCOUNTER — Telehealth: Payer: Self-pay | Admitting: Family Medicine

## 2018-06-10 NOTE — Telephone Encounter (Signed)
Copied from CRM [id number]. Topic: Quick Communication - See Telephone Encounter >> Jun 10, 2018  1:28 PM Fanny BienIlderton, Jessica L wrote: CRM for notification. See Telephone encounter for: 06/10/18. Dr Aneta MinsKaratla with cardiology called and stated that he would like to talk to zoe stalling regarding pt disability paperwork. DR Aneta MinsKaratla states that shw can call his cell at [phone]. Please advise

## 2018-06-11 ENCOUNTER — Telehealth: Payer: Self-pay | Admitting: Family Medicine

## 2018-06-11 DIAGNOSIS — O99210 Obesity complicating pregnancy, unspecified trimester: Secondary | ICD-10-CM | POA: Insufficient documentation

## 2018-06-11 DIAGNOSIS — Z889 Allergy status to unspecified drugs, medicaments and biological substances status: Secondary | ICD-10-CM | POA: Insufficient documentation

## 2018-06-11 DIAGNOSIS — Z91013 Allergy to seafood: Secondary | ICD-10-CM

## 2018-06-11 HISTORY — DX: Allergy to seafood: Z91.013

## 2018-06-11 NOTE — Telephone Encounter (Signed)
Copied from CRM [id number]. Topic: General - Other >> Jun 11, 2018 10:23 AM Ronney LionArrington, Shykila A wrote: Reason for CRM: Dr. Suzie PortelaPayne called in wanting to speak with Dr. Creta LevinStallings to go over medical records for a mutual patient they see. He is a rheumatologist at [facility]  He would like a call back from Dr. Creta LevinStallings at her earliest convenience.

## 2018-06-11 NOTE — Telephone Encounter (Signed)
Sorry just got the forms--will scan and faxed them on 06/11/18

## 2018-06-11 NOTE — Telephone Encounter (Signed)
Copied from CRM [id number]. Topic: General - Other >> Jun 10, 2018  3:16 PM Arlyss Gandyichardson, Taren N, NT wrote: Reason for CRM: Abby with Dr. Tim LairBroome's office calling to see if Dr. Creta LevinStallings will be available for a peer to peer review on 11/20 or 11/21 between 11-1pm or 3-5pm. CB#: [phone]: [phone]

## 2018-06-11 NOTE — Telephone Encounter (Signed)
Have we completed these forms? °

## 2018-06-12 ENCOUNTER — Other Ambulatory Visit (HOSPITAL_COMMUNITY): Payer: Self-pay

## 2018-06-12 DIAGNOSIS — N912 Amenorrhea, unspecified: Secondary | ICD-10-CM | POA: Diagnosis not present

## 2018-06-12 DIAGNOSIS — O3680X9 Pregnancy with inconclusive fetal viability, other fetus: Secondary | ICD-10-CM | POA: Diagnosis not present

## 2018-06-12 DIAGNOSIS — Z3A01 Less than 8 weeks gestation of pregnancy: Secondary | ICD-10-CM | POA: Diagnosis not present

## 2018-06-12 NOTE — Telephone Encounter (Signed)
Patient calling back to inquire on status of forms stating that her work never received the fax-therefore not meeting deadline. Patient inquired if forms can be re-faxed with a note stating that office had previously tried sending on 11/13. Please advise.   Fax # [phone]

## 2018-06-13 ENCOUNTER — Telehealth: Payer: Self-pay | Admitting: Family Medicine

## 2018-06-13 NOTE — Telephone Encounter (Signed)
Copied from CRM [id number]. Topic: General - Other >> Jun 12, 2018  5:41 PM Marylen PontoMcneil, Ja-Kwan wrote: Reason for CRM: Dr. Cleta AlbertsKaratela - Cardiologist requests call back from Dr. Creta LevinStallings. Cb# [phone]

## 2018-06-13 NOTE — Telephone Encounter (Signed)
pls see note. thanks 

## 2018-06-13 NOTE — Telephone Encounter (Signed)
Copied from CRM [id number]. Topic: General - Other >> Jun 13, 2018  2:59 PM Tamela OddiHarris, Brenda J wrote: Reason for CRM: Dr. Suzie PortelaPayne, Rheumatologist,  called to let Dr. Creta LevinStallings know that after reviewing Ms. Fosters case, he wanted her advice on whether patient has any limitations in doing her job.  Please advise and call back to discuss.  Today call at [phone], or tomorrow at [phone]

## 2018-06-13 NOTE — Telephone Encounter (Signed)
[employer] the company covering this patients disability claim, would like to schedule a time to speak with Dr Creta LevinStallings regarding this patients disability.   They would like for Dr Creta LevinStallings to give them a call at [phone] to schedule a good time to call and talk.

## 2018-06-17 ENCOUNTER — Telehealth: Payer: Self-pay

## 2018-06-17 NOTE — Telephone Encounter (Signed)
Tried contacting pt via phone to advise we received message re: disability forms needing to be refaxed so she can receive her benefits- no answer and voicemail full and unable to leave message.  Refaxed disability forms and confirmation email received.  Will try pt again later. Dgaddy, CMA

## 2018-06-17 NOTE — Telephone Encounter (Signed)
Attempted to reach the patient at the mobile number with no answer.    CALLED [phone] Regarding patient's disability claim.  Spoke with Dr. Emelda Brothersennis Payne regarding patient's lupus.  Explained that the work up is ongoing with need for Echo Have not reviewed the note from Rheumatology as yet Discussed that she has frequent ER visit and continuing symptoms as well as known hypercoagulability.  He will take these findings into account.  Expressed that I did not see how the patient could continue working full time with these limitations.

## 2018-06-26 ENCOUNTER — Inpatient Hospital Stay (HOSPITAL_COMMUNITY)
Admission: AD | Admit: 2018-06-26 | Discharge: 2018-06-26 | Disposition: A | Discharge status: Home / Self Care | Payer: 59 | Source: Ambulatory Visit | Attending: Obstetrics and Gynecology | Admitting: Obstetrics and Gynecology

## 2018-06-26 ENCOUNTER — Inpatient Hospital Stay (HOSPITAL_COMMUNITY): Payer: 59

## 2018-06-26 ENCOUNTER — Encounter (HOSPITAL_COMMUNITY): Payer: Self-pay | Admitting: *Deleted

## 2018-06-26 DIAGNOSIS — Z3A01 Less than 8 weeks gestation of pregnancy: Secondary | ICD-10-CM | POA: Insufficient documentation

## 2018-06-26 DIAGNOSIS — Z86711 Personal history of pulmonary embolism: Secondary | ICD-10-CM | POA: Insufficient documentation

## 2018-06-26 DIAGNOSIS — Z3490 Encounter for supervision of normal pregnancy, unspecified, unspecified trimester: Secondary | ICD-10-CM

## 2018-06-26 DIAGNOSIS — R102 Pelvic and perineal pain: Secondary | ICD-10-CM | POA: Diagnosis present

## 2018-06-26 DIAGNOSIS — O210 Mild hyperemesis gravidarum: Secondary | ICD-10-CM | POA: Diagnosis not present

## 2018-06-26 DIAGNOSIS — O219 Vomiting of pregnancy, unspecified: Secondary | ICD-10-CM | POA: Diagnosis not present

## 2018-06-26 LAB — COMPREHENSIVE METABOLIC PANEL
ALK PHOS: 67 U/L (ref 38–126)
ALT: 16 U/L (ref 0–44)
AST: 18 U/L (ref 15–41)
Albumin: 3.8 g/dL (ref 3.5–5.0)
Anion gap: 8 (ref 5–15)
BUN: 8 mg/dL (ref 6–20)
CALCIUM: 9.1 mg/dL (ref 8.9–10.3)
CO2: 23 mmol/L (ref 22–32)
CREATININE: 0.68 mg/dL (ref 0.44–1.00)
Chloride: 104 mmol/L (ref 98–111)
GFR calc Af Amer: >60 mL/min (ref >60)
Glucose, Bld: 91 mg/dL (ref 70–99)
Potassium: 3.9 mmol/L (ref 3.5–5.1)
SODIUM: 135 mmol/L (ref 135–145)
Total Bilirubin: 0.4 mg/dL (ref 0.3–1.2)
Total Protein: 7.6 g/dL (ref 6.5–8.1)

## 2018-06-26 LAB — CBC WITH DIFFERENTIAL/PLATELET
BASOS ABS: 0 10*3/uL (ref 0.0–0.1)
Basophils Relative: 0 %
Eosinophils Absolute: 0.1 10*3/uL (ref 0.0–0.5)
Eosinophils Relative: 2 %
HCT: 37.5 % (ref 36.0–46.0)
HEMOGLOBIN: 12 g/dL (ref 12.0–15.0)
LYMPHS ABS: 1.8 10*3/uL (ref 0.7–4.0)
LYMPHS PCT: 38 %
MCH: 27 pg (ref 26.0–34.0)
MCHC: 32 g/dL (ref 30.0–36.0)
MCV: 84.3 fL (ref 80.0–100.0)
Monocytes Absolute: 0.2 10*3/uL (ref 0.1–1.0)
Monocytes Relative: 4 %
NEUTROS PCT: 56 %
Neutro Abs: 2.6 10*3/uL (ref 1.7–7.7)
Platelets: 302 10*3/uL (ref 150–400)
RBC: 4.45 MIL/uL (ref 3.87–5.11)
RDW: 15.6 % — ABNORMAL HIGH (ref 11.5–15.5)
WBC: 4.8 10*3/uL (ref 4.0–10.5)
nRBC: 0 % (ref 0.0–0.2)

## 2018-06-26 LAB — URINALYSIS, ROUTINE W REFLEX MICROSCOPIC
Bacteria, UA: NONE SEEN
Bilirubin Urine: NEGATIVE
Glucose, UA: NEGATIVE mg/dL
Ketones, ur: NEGATIVE mg/dL
LEUKOCYTES UA: NEGATIVE
NITRITE: NEGATIVE
PH: 6 (ref 5.0–8.0)
Protein, ur: NEGATIVE mg/dL
SPECIFIC GRAVITY, URINE: 1.02 (ref 1.005–1.030)

## 2018-06-26 MED ORDER — PROMETHAZINE HCL 25 MG PO TABS: 12.5000 mg | ORAL_TABLET | Freq: Four times a day (QID) | ORAL | 0 refills | Status: DC | PRN

## 2018-06-26 MED ORDER — LACTATED RINGERS IV BOLUS
1000.0000 mL | Freq: Once | INTRAVENOUS | Status: AC
  Administered 2018-06-26: 1000 mL via INTRAVENOUS

## 2018-06-26 NOTE — MAU Provider Note (Signed)
History     CSN: [id number]  Arrival date and time: 06/26/18 [id number]   First Provider Initiated Contact with Patient 06/26/18 1019      Chief Complaint  Patient presents with  . Pelvic Pain  . Nausea   Hayley Webb is a 32 y.o. G3P1011 at 7161w3d who presents today with nausea/fatigue and pelvic pressure x 3 days. She had an US in the office 2 weeks ago, and saw gestational sac and yolk sac. She has an appointment on 06/27/18 for FU US in the office. She states that she has stopped the eliquis at this time. She is only taking her prenatal vitamins.   Pelvic Pain  The patient's primary symptoms include pelvic pain. This is a new problem. The current episode started in the past 7 days. The problem occurs intermittently. The problem has been unchanged. Pain severity now: 6/10. The problem affects both sides. She is pregnant. Associated symptoms include nausea and vomiting (approx 1-2 times per day ). Pertinent negatives include no chills or fever. The vaginal discharge was normal. Vaginal bleeding amount: had some pink and brown spotting 3 days ago, but none since.  Menstrual history: LMP 04/18/18     OB History    Gravida  3   Para  1   Term  1   Preterm      AB  1   Living  1     SAB  1   TAB      Ectopic      Multiple      Live Births  1           Past Medical History:  Diagnosis Date  . Anxiety    doing good now  . Asthma   . Headache(784.0)   . HSV infection   . Infection    UTI  . Lupus (HCC)    dx age 32  . Pulmonary embolism (HCC) 06/4/82011  . 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 MOUTH SURGERY     2 teeth removed- 1 wisdom and 1 in front of it  . TONSILLECTOMY AND ADENOIDECTOMY  1998  . WISDOM TOOTH EXTRACTION      Family History  Problem Relation Age of Onset  . Diabetes Maternal Aunt   . Cancer Maternal Grandmother 72       COLON CA  . Diabetes Maternal  Grandmother   . Hypertension Maternal Grandfather   . Cancer Maternal Grandfather        prostate  . Asthma Mother   . Asthma Brother   . Cancer Paternal Grandmother        breast  . Lupus Paternal Grandmother   . Anesthesia problems Neg Hx   . Hypotension Neg Hx   . Malignant hyperthermia Neg Hx   . Pseudochol deficiency Neg Hx     Social History   Tobacco Use  . Smoking status: Never Smoker  . Smokeless tobacco: Never Used  Substance Use Topics  . Alcohol use: No  . Drug use: No    Allergies:  Allergies  Allergen Reactions  . Bactrim Anaphylaxis and Hives  . Hydrocodone-Acetaminophen Anaphylaxis  . Nifedipine Swelling and Anaphylaxis    Swelling of tongue  . Pineapple Itching  . Prednisone Anaphylaxis and Hives    Has been on Dexamethasone without issue.  . Shellfish Allergy Itching  . Sulfamethoxazole-Trimethoprim Anaphylaxis  . Nsaids Other (See Comments)  Has Lupus, reccommended to avoid NSAIDs  . Other Itching    Shrimp:throat itching "can eat other shellfish"  . Vicodin [Hydrocodone-Acetaminophen] Hives and Swelling    Can take Percocet without difficulty    Medications Prior to Admission  Medication Sig Dispense Refill Last Dose  . albuterol (PROVENTIL HFA;VENTOLIN HFA) 108 (90 Base) MCG/ACT inhaler Inhale 2 puffs into the lungs every 6 (six) hours as needed for wheezing or shortness of breath.   Taking  . albuterol (PROVENTIL) (2.5 MG/3ML) 0.083% nebulizer solution Take 2.5 mg by nebulization every 6 (six) hours as needed for wheezing or shortness of breath.   Taking  . benzonatate (TESSALON) 100 MG capsule Take 1 capsule (100 mg total) by mouth every 8 (eight) hours. 21 capsule 0 Taking  . hydroxychloroquine (PLAQUENIL) 200 MG tablet Take 200 mg by mouth daily.   Taking  . methylPREDNISolone (MEDROL) 4 MG tablet Take 4 mg by mouth daily.   Taking    Review of Systems  Constitutional: Positive for fatigue. Negative for chills and fever.   Respiratory: Negative for shortness of breath.   Cardiovascular: Negative for chest pain.  Gastrointestinal: Positive for nausea and vomiting (approx 1-2 times per day ).  Genitourinary: Positive for pelvic pain and vaginal bleeding.  Neurological: Positive for weakness.   Physical Exam   Blood pressure (!) 143/90, pulse 91, temperature 98.2 F (36.8 C), temperature source Oral, resp. rate 18, height 5\' 8"  (1.727 m), weight (!) 149.2 kg, last menstrual period 04/18/2018, SpO2 100 %.  Physical Exam  Nursing note and vitals reviewed. Constitutional: She is oriented to person, place, and time. She appears well-developed and well-nourished. No distress.  HENT:  Head: Normocephalic.  Cardiovascular: Normal rate.  Respiratory: Effort normal.  GI: Soft. There is no tenderness. There is no rebound.  Neurological: She is alert and oriented to person, place, and time.  Skin: Skin is warm and dry.  Psychiatric: She has a normal mood and affect.   Results for orders placed or performed during the hospital encounter of 06/26/18 (from the past 24 hour(s))  Urinalysis, Routine w reflex microscopic     Status: Abnormal   Collection Time: 06/26/18 10:06 AM  Result Value Ref Range   Color, Urine YELLOW YELLOW   APPearance CLEAR CLEAR   Specific Gravity, Urine 1.020 1.005 - 1.030   pH 6.0 5.0 - 8.0   Glucose, UA NEGATIVE NEGATIVE mg/dL   Hgb urine dipstick SMALL (A) NEGATIVE   Bilirubin Urine NEGATIVE NEGATIVE   Ketones, ur NEGATIVE NEGATIVE mg/dL   Protein, ur NEGATIVE NEGATIVE mg/dL   Nitrite NEGATIVE NEGATIVE   Leukocytes, UA NEGATIVE NEGATIVE   RBC / HPF 6-10 0 - 5 RBC/hpf   WBC, UA 0-5 0 - 5 WBC/hpf   Bacteria, UA NONE SEEN NONE SEEN   Squamous Epithelial / LPF 0-5 0 - 5   Mucus PRESENT   CBC with Differential/Platelet     Status: Abnormal   Collection Time: 06/26/18 10:36 AM  Result Value Ref Range   WBC 4.8 4.0 - 10.5 K/uL   RBC 4.45 3.87 - 5.11 MIL/uL   Hemoglobin 12.0 12.0 -  15.0 g/dL   HCT 40.9 81.1 - 91.4 %   MCV 84.3 80.0 - 100.0 fL   MCH 27.0 26.0 - 34.0 pg   MCHC 32.0 30.0 - 36.0 g/dL   RDW 78.2 (H) 95.6 - 21.3 %   Platelets 302 150 - 400 K/uL   nRBC 0.0 0.0 - 0.2 %  Neutrophils Relative % 56 %   Neutro Abs 2.6 1.7 - 7.7 K/uL   Lymphocytes Relative 38 %   Lymphs Abs 1.8 0.7 - 4.0 K/uL   Monocytes Relative 4 %   Monocytes Absolute 0.2 0.1 - 1.0 K/uL   Eosinophils Relative 2 %   Eosinophils Absolute 0.1 0.0 - 0.5 K/uL   Basophils Relative 0 %   Basophils Absolute 0.0 0.0 - 0.1 K/uL  Comprehensive metabolic panel     Status: None   Collection Time: 06/26/18 10:36 AM  Result Value Ref Range   Sodium 135 135 - 145 mmol/L   Potassium 3.9 3.5 - 5.1 mmol/L   Chloride 104 98 - 111 mmol/L   CO2 23 22 - 32 mmol/L   Glucose, Bld 91 70 - 99 mg/dL   BUN 8 6 - 20 mg/dL   Creatinine, Ser 4.09 0.44 - 1.00 mg/dL   Calcium 9.1 8.9 - 81.1 mg/dL   Total Protein 7.6 6.5 - 8.1 g/dL   Albumin 3.8 3.5 - 5.0 g/dL   AST 18 15 - 41 U/L   ALT 16 0 - 44 U/L   Alkaline Phosphatase 67 38 - 126 U/L   Total Bilirubin 0.4 0.3 - 1.2 mg/dL   GFR calc non Af Amer >60 >60 mL/min   GFR calc Af Amer >60 >60 mL/min   Anion gap 8 5 - 15   US Ob Transvaginal  Result Date: 06/26/2018 CLINICAL DATA:  Pelvic pain. Gestational age by LMP of 9 weeks 6 days. EXAM: OBSTETRIC <14 WK [place] AND TRANSVAGINAL OB US TECHNIQUE: Both transabdominal and transvaginal ultrasound examinations were performed for complete evaluation of the gestation as well as the maternal uterus, adnexal regions, and pelvic cul-de-sac. Transvaginal technique was performed to assess early pregnancy. COMPARISON:  None. FINDINGS: Intrauterine gestational sac: Single Yolk sac:  Visualized. Embryo:  Visualized. Cardiac Activity: Visualized. Heart Rate: 159 bpm CRL:  13 mm   7 w   3 d                  [place] EDC: 02/09/2019 Subchorionic hemorrhage:  Small subchorionic hemorrhage is seen. Maternal uterus/adnexae: A small subserosal  fibroid is seen in the right posterior corpus measuring 1.8 cm. Both ovaries are normal in appearance. No adnexal mass or abnormal free fluid identified. IMPRESSION: Single living IUP measuring 7 weeks 3 days, with [place] EDC of 02/09/2019. Small subchorionic hemorrhage. 1.8 cm posterior subserosal fibroid. Electronically Signed   By: Myles Rosenthal M.D.   On: 06/26/2018 12:37    MAU Course  Procedures  MDM Patient has had 1L or IV fluids. She reports that she is feeling better.  US shows viable IUP. Patient advised to keep FU visit with OB so that anticoagulation meds can be resumed.   Assessment and Plan   1. Nausea/vomiting in pregnancy   2. [redacted] weeks gestation of pregnancy   3. Intrauterine pregnancy    DC home Comfort measures reviewed  1st Trimester precautions  Bleeding precautions RX: phenergan PRN #[id number]  Return to MAU as needed FU with OB as planned  Follow-up Information    [facility] Follow up.   Specialty:  Obstetrics and Gynecology Contact information: [address] [place] [address] [phone]           Thressa Sheller 06/26/2018, 10:21 AM

## 2018-06-26 NOTE — Discharge Instructions (Signed)
First Trimester of Pregnancy The first trimester of pregnancy is from week 1 until the end of week 13 (months 1 through 3). A week after a sperm fertilizes an egg, the egg will implant on the wall of the uterus. This embryo will begin to develop into a baby. Genes from you and your partner will form the baby. The female genes will determine whether the baby will be a boy or a girl. At 6-8 weeks, the eyes and face will be formed, and the heartbeat can be seen on ultrasound. At the end of 12 weeks, all the baby's organs will be formed. Now that you are pregnant, you will want to do everything you can to have a healthy baby. Two of the most important things are to get good prenatal care and to follow your health care provider's instructions. Prenatal care is all the medical care you receive before the baby's birth. This care will help prevent, find, and treat any problems during the pregnancy and childbirth. Body changes during your first trimester Your body goes through many changes during pregnancy. The changes vary from woman to woman.  You may gain or lose a couple of pounds at first.  You may feel sick to your stomach (nauseous) and you may throw up (vomit). If the vomiting is uncontrollable, call your health care provider.  You may tire easily.  You may develop headaches that can be relieved by medicines. All medicines should be approved by your health care provider.  You may urinate more often. Painful urination may mean you have a bladder infection.  You may develop heartburn as a result of your pregnancy.  You may develop constipation because certain hormones are causing the muscles that push stool through your intestines to slow down.  You may develop hemorrhoids or swollen veins (varicose veins).  Your breasts may begin to grow larger and become tender. Your nipples may stick out more, and the tissue that surrounds them (areola) may become darker.  Your gums may bleed and may be  sensitive to brushing and flossing.  Dark spots or blotches (chloasma, mask of pregnancy) may develop on your face. This will likely fade after the baby is born.  Your menstrual periods will stop.  You may have a loss of appetite.  You may develop cravings for certain kinds of food.  You may have changes in your emotions from day to day, such as being excited to be pregnant or being concerned that something may go wrong with the pregnancy and baby.  You may have more vivid and strange dreams.  You may have changes in your hair. These can include thickening of your hair, rapid growth, and changes in texture. Some women also have hair loss during or after pregnancy, or hair that feels dry or thin. Your hair will most likely return to normal after your baby is born.  What to expect at prenatal visits During a routine prenatal visit:  You will be weighed to make sure you and the baby are growing normally.  Your blood pressure will be taken.  Your abdomen will be measured to track your baby's growth.  The fetal heartbeat will be listened to between weeks 10 and 14 of your pregnancy.  Test results from any previous visits will be discussed.  Your health care provider may ask you:  How you are feeling.  If you are feeling the baby move.  If you have had any abnormal symptoms, such as leaking fluid, bleeding, severe headaches,   or abdominal cramping.  If you are using any tobacco products, including cigarettes, chewing tobacco, and electronic cigarettes.  If you have any questions.  Other tests that may be performed during your first trimester include:  Blood tests to find your blood type and to check for the presence of any previous infections. The tests will also be used to check for low iron levels (anemia) and protein on red blood cells (Rh antibodies). Depending on your risk factors, or if you previously had diabetes during pregnancy, you may have tests to check for high blood  sugar that affects pregnant women (gestational diabetes).  Urine tests to check for infections, diabetes, or protein in the urine.  An ultrasound to confirm the proper growth and development of the baby.  Fetal screens for spinal cord problems (spina bifida) and Down syndrome.  HIV (human immunodeficiency virus) testing. Routine prenatal testing includes screening for HIV, unless you choose not to have this test.  You may need other tests to make sure you and the baby are doing well.  Follow these instructions at home: Medicines  Follow your health care provider's instructions regarding medicine use. Specific medicines may be either safe or unsafe to take during pregnancy.  Take a prenatal vitamin that contains at least 600 micrograms (mcg) of folic acid.  If you develop constipation, try taking a stool softener if your health care provider approves. Eating and drinking  Eat a balanced diet that includes fresh fruits and vegetables, whole grains, good sources of protein such as meat, eggs, or tofu, and low-fat dairy. Your health care provider will help you determine the amount of weight gain that is right for you.  Avoid raw meat and uncooked cheese. These carry germs that can cause birth defects in the baby.  Eating four or five small meals rather than three large meals a day may help relieve nausea and vomiting. If you start to feel nauseous, eating a few soda crackers can be helpful. Drinking liquids between meals, instead of during meals, also seems to help ease nausea and vomiting.  Limit foods that are high in fat and processed sugars, such as fried and sweet foods.  To prevent constipation: ? Eat foods that are high in fiber, such as fresh fruits and vegetables, whole grains, and beans. ? Drink enough fluid to keep your urine clear or pale yellow. Activity  Exercise only as directed by your health care provider. Most women can continue their usual exercise routine during  pregnancy. Try to exercise for 30 minutes at least 5 days a week. Exercising will help you: ? Control your weight. ? Stay in shape. ? Be prepared for labor and delivery.  Experiencing pain or cramping in the lower abdomen or lower back is a good sign that you should stop exercising. Check with your health care provider before continuing with normal exercises.  Try to avoid standing for long periods of time. Move your legs often if you must stand in one place for a long time.  Avoid heavy lifting.  Wear low-heeled shoes and practice good posture.  You may continue to have sex unless your health care provider tells you not to. Relieving pain and discomfort  Wear a good support bra to relieve breast tenderness.  Take warm sitz baths to soothe any pain or discomfort caused by hemorrhoids. Use hemorrhoid cream if your health care provider approves.  Rest with your legs elevated if you have leg cramps or low back pain.  If you develop   varicose veins in your legs, wear support hose. Elevate your feet for 15 minutes, 3-4 times a day. Limit salt in your diet. Prenatal care  Schedule your prenatal visits by the twelfth week of pregnancy. They are usually scheduled monthly at first, then more often in the last 2 months before delivery.  Write down your questions. Take them to your prenatal visits.  Keep all your prenatal visits as told by your health care provider. This is important. Safety  Wear your seat belt at all times when driving.  Make a list of emergency phone numbers, including numbers for family, friends, the hospital, and police and fire departments. General instructions  Ask your health care provider for a referral to a local prenatal education class. Begin classes no later than the beginning of month 6 of your pregnancy.  Ask for help if you have counseling or nutritional needs during pregnancy. Your health care provider can offer advice or refer you to specialists for help  with various needs.  Do not use hot tubs, steam rooms, or saunas.  Do not douche or use tampons or scented sanitary pads.  Do not cross your legs for long periods of time.  Avoid cat litter boxes and soil used by cats. These carry germs that can cause birth defects in the baby and possibly loss of the fetus by miscarriage or stillbirth.  Avoid all smoking, herbs, alcohol, and medicines not prescribed by your health care provider. Chemicals in these products affect the formation and growth of the baby.  Do not use any products that contain nicotine or tobacco, such as cigarettes and e-cigarettes. If you need help quitting, ask your health care provider. You may receive counseling support and other resources to help you quit.  Schedule a dentist appointment. At home, brush your teeth with a soft toothbrush and be gentle when you floss. Contact a health care provider if:  You have dizziness.  You have mild pelvic cramps, pelvic pressure, or nagging pain in the abdominal area.  You have persistent nausea, vomiting, or diarrhea.  You have a bad smelling vaginal discharge.  You have pain when you urinate.  You notice increased swelling in your face, hands, legs, or ankles.  You are exposed to fifth disease or chickenpox.  You are exposed to German measles (rubella) and have never had it. Get help right away if:  You have a fever.  You are leaking fluid from your vagina.  You have spotting or bleeding from your vagina.  You have severe abdominal cramping or pain.  You have rapid weight gain or loss.  You vomit blood or material that looks like coffee grounds.  You develop a severe headache.  You have shortness of breath.  You have any kind of trauma, such as from a fall or a car accident. Summary  The first trimester of pregnancy is from week 1 until the end of week 13 (months 1 through 3).  Your body goes through many changes during pregnancy. The changes vary from  woman to woman.  You will have routine prenatal visits. During those visits, your health care provider will examine you, discuss any test results you may have, and talk with you about how you are feeling. This information is not intended to replace advice given to you by your health care provider. Make sure you discuss any questions you have with your health care provider. Document Released: 07/04/2001 Document Revised: 06/21/2016 Document Reviewed: 06/21/2016 Elsevier Interactive Patient Education  2018 Elsevier   Inc.  

## 2018-06-26 NOTE — MAU Note (Signed)
Pt reports nausea , weakness, lightheaded for the last 3 days, today has a lot of pressure in her vaginal area. Denies bleeding , denies dysuria

## 2018-06-27 DIAGNOSIS — Z3A01 Less than 8 weeks gestation of pregnancy: Secondary | ICD-10-CM | POA: Diagnosis not present

## 2018-06-27 DIAGNOSIS — M329 Systemic lupus erythematosus, unspecified: Secondary | ICD-10-CM | POA: Diagnosis not present

## 2018-06-27 DIAGNOSIS — Z113 Encounter for screening for infections with a predominantly sexual mode of transmission: Secondary | ICD-10-CM | POA: Diagnosis not present

## 2018-06-27 DIAGNOSIS — I2699 Other pulmonary embolism without acute cor pulmonale: Secondary | ICD-10-CM | POA: Diagnosis not present

## 2018-06-27 DIAGNOSIS — N925 Other specified irregular menstruation: Secondary | ICD-10-CM | POA: Diagnosis not present

## 2018-07-01 DIAGNOSIS — Z3490 Encounter for supervision of normal pregnancy, unspecified, unspecified trimester: Secondary | ICD-10-CM | POA: Insufficient documentation

## 2018-07-01 HISTORY — DX: Encounter for supervision of normal pregnancy, unspecified, unspecified trimester: Z34.90

## 2018-07-02 DIAGNOSIS — Z3401 Encounter for supervision of normal first pregnancy, first trimester: Secondary | ICD-10-CM | POA: Diagnosis not present

## 2018-07-02 DIAGNOSIS — O3680X9 Pregnancy with inconclusive fetal viability, other fetus: Secondary | ICD-10-CM | POA: Diagnosis not present

## 2018-07-02 DIAGNOSIS — R859 Unspecified abnormal finding in specimens from digestive organs and abdominal cavity: Secondary | ICD-10-CM | POA: Diagnosis not present

## 2018-07-02 DIAGNOSIS — Z3A01 Less than 8 weeks gestation of pregnancy: Secondary | ICD-10-CM | POA: Diagnosis not present

## 2018-07-02 DIAGNOSIS — N898 Other specified noninflammatory disorders of vagina: Secondary | ICD-10-CM | POA: Diagnosis not present

## 2018-07-03 ENCOUNTER — Other Ambulatory Visit (HOSPITAL_COMMUNITY): Payer: Self-pay | Admitting: *Deleted

## 2018-07-03 ENCOUNTER — Encounter (HOSPITAL_COMMUNITY): Payer: 59

## 2018-07-03 ENCOUNTER — Ambulatory Visit (HOSPITAL_COMMUNITY)
Admission: RE | Admit: 2018-07-03 | Discharge: 2018-07-03 | Disposition: A | Discharge status: Home / Self Care | Payer: 59 | Source: Ambulatory Visit | Attending: Obstetrics & Gynecology | Admitting: Obstetrics & Gynecology

## 2018-07-03 ENCOUNTER — Other Ambulatory Visit: Payer: Self-pay

## 2018-07-03 DIAGNOSIS — Z3A08 8 weeks gestation of pregnancy: Secondary | ICD-10-CM | POA: Diagnosis not present

## 2018-07-03 DIAGNOSIS — Z86711 Personal history of pulmonary embolism: Secondary | ICD-10-CM | POA: Diagnosis not present

## 2018-07-03 DIAGNOSIS — O26891 Other specified pregnancy related conditions, first trimester: Secondary | ICD-10-CM | POA: Insufficient documentation

## 2018-07-03 DIAGNOSIS — M329 Systemic lupus erythematosus, unspecified: Secondary | ICD-10-CM | POA: Diagnosis not present

## 2018-07-03 DIAGNOSIS — Z7901 Long term (current) use of anticoagulants: Secondary | ICD-10-CM | POA: Insufficient documentation

## 2018-07-03 NOTE — Consult Note (Addendum)
Hayley Webb was seen for a high risk pregnancy consultation due to due to history of systemic lupus erythematosus(SLE) and multiple episodes of pulmonary embolism. Her partner was present for the duration of this consultation.  She is a 32 year old G3P1011 at 8 weeks and 3 days gestational age.   Her obstetric and gynecology history is as follows: 2010- Spontaneous abortion at [redacted] weeks gestational age 30- Term spontaneous vaginal delivery. 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 Pregnancy was co-managed with [facility]  Hayley Webb has suffered from SLE since 2012 and is presently on Plaquenil 200 mg daily. Her last lupus flare was August this year. On review of her labs, normal kidney function observed. Lupus is also co-managed with a Rheumatologist.   She suffered from three(3) episodes pulmonary embolism between the months of June and July 2018. Her life long anticoagulation with Eliquis was discontinued when pregnancy was diagnosed.  No other significant past medical and or surgical history is reported. She denies smoking, alcohol intake or use of illicit drugs. Allergies to Procardia(anaphylaxis), Vicodin(anaphylaxis), Prednisone(anaphylaxis), Shrimp(throat scratching), Pineapple(throat scratching) and Bactrim(anaphylaxis) is reported. 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  Ultrasound not performed today as this was not requested.  History of SLE This condition in pregnancy may be associated with preeclampsia (8-20%), eclampsia (1%), preterm birth (20-50%), FGR (5-20%). In other to mitigate this risk, continuation of Plaquenil (which has a good safety record in pregnancy) in addition to low dose aspirin is encouraged. In addition to continuation of Plaquenil, co-management with Rheumatologist is recommended. Other management modalities should include, serial growth ultrasounds from [redacted]  weeks gestational age, fetal surveillance from [redacted] weeks gestational age and delivery by [redacted] weeks gestational age.Estrogen-containing hormonal contraception should be avoided and non-smoking status encouraged. Baby aspirin should be started at [redacted] weeks GA.  History of heart block Due to presence of Sjgren syndrome antibody and history of congenital heart block, the risk for recurrence has been reported by most experts as ~16%. In order to mitigate this risk Plaquenil should be continued and serial fetal echocardiogram(to measure mechanical interval) should be performed between 18 and [redacted] weeks GA at 2 week interval. We have made said referral to [facility] for 10 weeks time.   Multiple history of pulmonary embolism Pregnant women when compared to their non-pregnant counterparts are 5-6 times more likely to suffer VTE(pulmonary embolism). A history of pulmonary embolism further increases this risk, especially if there is a multiple history. Since she is on life-long anticoagulation Lovenox should commence as soon as possible. I have discussed with Dr. Beverely Low) to prescribe Lovenox 150 mg bid(based on her weight). Care should be co-managed with a Hematologist in order to adjust dose as pregnancy progresses. Therapeutic anticoagulation with Eliquis should be re-started 6-24 hors post delivery depending on mode of delivery, use of axial anesthesia, and presence/absence of postpartum hemorrhage. In addition estrogen containing contraception should be avoided and non-smoking status encouraged.  Based on the aforementioned, recommendations are surmised as follows: 1. Commence Lovenox as soon as possible 2. Co-management with Hematologist as soon as possible 3. Co-management with Rheumatologist. 4. Serial fetal echocardiogram(mechanical PR interval) at [facility] between 18 and [redacted] weeks GA. 5. Continue anticoagulation post delivery as stated above 6. Serial fetal weight estimation at 3-4 week  interval from [redacted] weeks GA. 7. Fetal surveillance from [redacted] weeks GA. 8. Delivery at 39 weeks unless otherwise indicated. 9.  Transition to Heparin at 36 weeks maybe consider versus continuation of Lovenox until 12 hour prior to induction of labor should be considered.  Hayley Webb verbalized understanding of our discussions and recommended plan of care as documented.  Follow up in 14 weeks is recommended for detailed anatomy survey.  Blase MessHenry Arlando Leisinger MD MFM

## 2018-07-08 ENCOUNTER — Inpatient Hospital Stay: Payer: 59 | Attending: Hematology and Oncology | Admitting: Hematology and Oncology

## 2018-07-08 ENCOUNTER — Telehealth: Payer: Self-pay | Admitting: Hematology and Oncology

## 2018-07-08 DIAGNOSIS — D6861 Antiphospholipid syndrome: Secondary | ICD-10-CM | POA: Diagnosis not present

## 2018-07-08 DIAGNOSIS — Z7901 Long term (current) use of anticoagulants: Secondary | ICD-10-CM | POA: Diagnosis not present

## 2018-07-08 DIAGNOSIS — Z79899 Other long term (current) drug therapy: Secondary | ICD-10-CM | POA: Diagnosis not present

## 2018-07-08 DIAGNOSIS — Z86711 Personal history of pulmonary embolism: Secondary | ICD-10-CM | POA: Insufficient documentation

## 2018-07-08 DIAGNOSIS — O26891 Other specified pregnancy related conditions, first trimester: Secondary | ICD-10-CM | POA: Diagnosis not present

## 2018-07-08 DIAGNOSIS — Z3A09 9 weeks gestation of pregnancy: Secondary | ICD-10-CM | POA: Insufficient documentation

## 2018-07-08 DIAGNOSIS — Z791 Long term (current) use of non-steroidal anti-inflammatories (NSAID): Secondary | ICD-10-CM | POA: Insufficient documentation

## 2018-07-08 MED ORDER — ENOXAPARIN SODIUM 40 MG/0.4ML ~~LOC~~ SOLN: 150.0000 mg | Freq: Two times a day (BID) | SUBCUTANEOUS | Status: DC

## 2018-07-08 NOTE — Assessment & Plan Note (Signed)
Pulmonary embolism diagnosed 12/30/2016 when she presented with chest pain and leg pains: Right lower lobe pulmonary artery Initial testing was positive for lupus anticoagulant but repeat testing was negative. Previous treatment: Eliquis  Currently patient is [redacted] weeks pregnant Current treatment: Lovenox 150 mg subcu twice daily  Plan to treat her with Lovenox until 1 week before delivery. We will switch her to heparin 7500 units subcu 3 times daily at that time. After delivery she can go back to Lovenox.  Because there is no data for breast-feeding and Eliquis, she will need to remain on Lovenox until she completes breast-feeding.  Return to clinic 1 week prior to scheduled delivery.

## 2018-07-08 NOTE — Progress Notes (Signed)
Patient Care Team: Doristine BosworthStallings, Zoe A, MD as PCP - General (Internal Medicine)  DIAGNOSIS:  Encounter Diagnosis  Name Primary?  Marland Kitchen. Antiphospholipid antibody syndrome (HCC)      CHIEF COMPLIANT: Pregnancy with antiphospholipid antibody syndrome, currently on Lovenox  INTERVAL HISTORY: Hayley Webb is a 32 year old with history of pulmonary embolism from antiphospholipid antibody syndrome who was noted to be pregnant and her treatment was switched from Eliquis to Lovenox.  She has been referred to us for discussion regarding Lovenox dosing.  She is currently 150 mg subcu twice daily.  REVIEW OF SYSTEMS:   Constitutional: Denies fevers, chills or abnormal weight loss Eyes: Denies blurriness of vision Ears, nose, mouth, throat, and face: Denies mucositis or sore throat Respiratory: Denies cough, dyspnea or wheezes Cardiovascular: Denies palpitation, chest discomfort Gastrointestinal:  Denies nausea, heartburn or change in bowel habits Skin: Denies abnormal skin rashes Lymphatics: Denies new lymphadenopathy or easy bruising Neurological:Denies numbness, tingling or new weaknesses Behavioral/Psych: Mood is stable, no new changes  Extremities: No lower extremity edema   All other systems were reviewed with the patient and are negative.  I have reviewed the past medical history, past surgical history, social history and family history with the patient and they are unchanged from previous note.  ALLERGIES:  is allergic to bactrim; hydrocodone-acetaminophen; nifedipine; pineapple; prednisone; shellfish allergy; sulfamethoxazole-trimethoprim; nsaids; other; and vicodin [hydrocodone-acetaminophen].  MEDICATIONS:  Current Outpatient Medications  Medication Sig Dispense Refill  . albuterol (PROVENTIL HFA;VENTOLIN HFA) 108 (90 Base) MCG/ACT inhaler Inhale 2 puffs into the lungs every 6 (six) hours as needed for wheezing or shortness of breath.    Marland Kitchen. albuterol (PROVENTIL) (2.5 MG/3ML) 0.083%  nebulizer solution Take 2.5 mg by nebulization every 6 (six) hours as needed for wheezing or shortness of breath.    . cholecalciferol (VITAMIN D) 1000 units tablet Take 1,000 Units by mouth daily.    Marland Kitchen. enoxaparin (LOVENOX) 40 MG/0.4ML injection Inject 1.5 mLs (150 mg total) into the skin every 12 (twelve) hours.    . hydroxychloroquine (PLAQUENIL) 200 MG tablet Take 100 mg by mouth 2 (two) times daily.    . Prenatal Vit-Fe Fumarate-FA (MULTIVITAMIN-PRENATAL) 27-0.8 MG TABS tablet Take 1 tablet by mouth daily at 12 noon.    . promethazine (PHENERGAN) 25 MG tablet Take 0.5-1 tablets (12.5-25 mg total) by mouth every 6 (six) hours as needed. 30 tablet 0   No current facility-administered medications for this visit.     PHYSICAL EXAMINATION: ECOG PERFORMANCE STATUS: 1 - Symptomatic but completely ambulatory  Vitals:   07/08/18 1524  BP: (!) 142/89  Pulse: 83  Resp: 17  Temp: 98.2 F (36.8 C)  SpO2: 100%   Filed Weights   07/08/18 1524  Weight: (!) 326 lb (147.9 kg)    GENERAL:alert, no distress and comfortable SKIN: skin color, texture, turgor are normal, no rashes or significant lesions EYES: normal, Conjunctiva are pink and non-injected, sclera clear OROPHARYNX:no exudate, no erythema and lips, buccal mucosa, and tongue normal  NECK: supple, thyroid normal size, non-tender, without nodularity LYMPH:  no palpable lymphadenopathy in the cervical, axillary or inguinal LUNGS: clear to auscultation and percussion with normal breathing effort HEART: regular rate & rhythm and no murmurs and no lower extremity edema ABDOMEN:abdomen soft, non-tender and normal bowel sounds MUSCULOSKELETAL:no cyanosis of digits and no clubbing  NEURO: alert & oriented x 3 with fluent speech, no focal motor/sensory deficits EXTREMITIES: No lower extremity edema   LABORATORY DATA:  I have reviewed the data as listed  CMP Latest Ref Rng & Units 06/26/2018 04/16/2018 03/06/2018  Glucose 70 - 99 mg/dL 91 82 84   BUN 6 - 20 mg/dL 8 5(L) 9  Creatinine 1.61 - 1.00 mg/dL 0.96 0.45 4.09  Sodium 135 - 145 mmol/L 135 137 142  Potassium 3.5 - 5.1 mmol/L 3.9 3.5 4.1  Chloride 98 - 111 mmol/L 104 103 106  CO2 22 - 32 mmol/L 23 25 22   Calcium 8.9 - 10.3 mg/dL 9.1 8.9 9.4  Total Protein 6.5 - 8.1 g/dL 7.6 - 7.2  Total Bilirubin 0.3 - 1.2 mg/dL 0.4 - 0.4  Alkaline Phos 38 - 126 U/L 67 - 94  AST 15 - 41 U/L 18 - 17  ALT 0 - 44 U/L 16 - 14    Lab Results  Component Value Date   WBC 4.8 06/26/2018   HGB 12.0 06/26/2018   HCT 37.5 06/26/2018   MCV 84.3 06/26/2018   PLT 302 06/26/2018   NEUTROABS 2.6 06/26/2018    ASSESSMENT & PLAN:  Antiphospholipid antibody syndrome (HCC) Pulmonary embolism diagnosed 12/30/2016 when she presented with chest pain and leg pains: Right lower lobe pulmonary artery Initial testing was positive for lupus anticoagulant but repeat testing was negative. Previous treatment: Eliquis  Currently patient is [redacted] weeks pregnant Current treatment: Lovenox 150 mg subcu twice daily  Plan to treat her with Lovenox until 1 week before delivery. We will switch her to heparin 7500 units subcu 3 times daily at that time. After delivery she can go back to Lovenox.  Because there is no data for breast-feeding and Eliquis, she will need to remain on Lovenox until she completes breast-feeding.  Return to clinic 1 week prior to scheduled delivery.     No orders of the defined types were placed in this encounter.  The patient has a good understanding of the overall plan. she agrees with it. she will call with any problems that may develop before the next visit here.   Tamsen Meek, MD 07/08/18

## 2018-07-08 NOTE — Telephone Encounter (Signed)
Gave avs and calendar ° °

## 2018-07-21 ENCOUNTER — Encounter (HOSPITAL_COMMUNITY): Payer: Self-pay | Admitting: *Deleted

## 2018-07-21 ENCOUNTER — Inpatient Hospital Stay (HOSPITAL_COMMUNITY)
Admission: AD | Admit: 2018-07-21 | Discharge: 2018-07-21 | Disposition: A | Discharge status: Home / Self Care | Payer: 59 | Source: Ambulatory Visit | Attending: Obstetrics and Gynecology | Admitting: Obstetrics and Gynecology

## 2018-07-21 DIAGNOSIS — Z86711 Personal history of pulmonary embolism: Secondary | ICD-10-CM | POA: Diagnosis not present

## 2018-07-21 DIAGNOSIS — Z8249 Family history of ischemic heart disease and other diseases of the circulatory system: Secondary | ICD-10-CM | POA: Insufficient documentation

## 2018-07-21 DIAGNOSIS — O99281 Endocrine, nutritional and metabolic diseases complicating pregnancy, first trimester: Secondary | ICD-10-CM | POA: Diagnosis not present

## 2018-07-21 DIAGNOSIS — J45909 Unspecified asthma, uncomplicated: Secondary | ICD-10-CM | POA: Diagnosis not present

## 2018-07-21 DIAGNOSIS — M329 Systemic lupus erythematosus, unspecified: Secondary | ICD-10-CM | POA: Diagnosis not present

## 2018-07-21 DIAGNOSIS — O219 Vomiting of pregnancy, unspecified: Secondary | ICD-10-CM

## 2018-07-21 DIAGNOSIS — Z79899 Other long term (current) drug therapy: Secondary | ICD-10-CM | POA: Insufficient documentation

## 2018-07-21 DIAGNOSIS — E860 Dehydration: Secondary | ICD-10-CM | POA: Insufficient documentation

## 2018-07-21 DIAGNOSIS — O26899 Other specified pregnancy related conditions, unspecified trimester: Secondary | ICD-10-CM

## 2018-07-21 DIAGNOSIS — F419 Anxiety disorder, unspecified: Secondary | ICD-10-CM | POA: Diagnosis not present

## 2018-07-21 DIAGNOSIS — Z3A11 11 weeks gestation of pregnancy: Secondary | ICD-10-CM | POA: Diagnosis not present

## 2018-07-21 DIAGNOSIS — O99341 Other mental disorders complicating pregnancy, first trimester: Secondary | ICD-10-CM | POA: Diagnosis not present

## 2018-07-21 DIAGNOSIS — R42 Dizziness and giddiness: Secondary | ICD-10-CM | POA: Diagnosis present

## 2018-07-21 DIAGNOSIS — O26891 Other specified pregnancy related conditions, first trimester: Secondary | ICD-10-CM | POA: Diagnosis not present

## 2018-07-21 DIAGNOSIS — Z7901 Long term (current) use of anticoagulants: Secondary | ICD-10-CM | POA: Insufficient documentation

## 2018-07-21 DIAGNOSIS — N888 Other specified noninflammatory disorders of cervix uteri: Secondary | ICD-10-CM | POA: Diagnosis not present

## 2018-07-21 DIAGNOSIS — O99280 Endocrine, nutritional and metabolic diseases complicating pregnancy, unspecified trimester: Secondary | ICD-10-CM

## 2018-07-21 DIAGNOSIS — O99511 Diseases of the respiratory system complicating pregnancy, first trimester: Secondary | ICD-10-CM | POA: Insufficient documentation

## 2018-07-21 LAB — COMPREHENSIVE METABOLIC PANEL
ALBUMIN: 3.3 g/dL — AB (ref 3.5–5.0)
ALK PHOS: 66 U/L (ref 38–126)
ALT: 25 U/L (ref 0–44)
AST: 22 U/L (ref 15–41)
Anion gap: 7 (ref 5–15)
BUN: 8 mg/dL (ref 6–20)
CALCIUM: 8.6 mg/dL — AB (ref 8.9–10.3)
CO2: 21 mmol/L — AB (ref 22–32)
CREATININE: 0.54 mg/dL (ref 0.44–1.00)
Chloride: 105 mmol/L (ref 98–111)
GFR calc Af Amer: >60 mL/min (ref >60)
GFR calc non Af Amer: >60 mL/min (ref >60)
GLUCOSE: 104 mg/dL — AB (ref 70–99)
Potassium: 3.2 mmol/L — ABNORMAL LOW (ref 3.5–5.1)
Sodium: 133 mmol/L — ABNORMAL LOW (ref 135–145)
Total Bilirubin: 0.3 mg/dL (ref 0.3–1.2)
Total Protein: 7 g/dL (ref 6.5–8.1)

## 2018-07-21 LAB — CBC WITH DIFFERENTIAL/PLATELET
BASOS ABS: 0 10*3/uL (ref 0.0–0.1)
BASOS PCT: 0 %
Eosinophils Absolute: 0.2 10*3/uL (ref 0.0–0.5)
Eosinophils Relative: 4 %
HCT: 34.5 % — ABNORMAL LOW (ref 36.0–46.0)
HEMOGLOBIN: 11.5 g/dL — AB (ref 12.0–15.0)
LYMPHS ABS: 2 10*3/uL (ref 0.7–4.0)
Lymphocytes Relative: 41 %
MCH: 27.6 pg (ref 26.0–34.0)
MCHC: 33.3 g/dL (ref 30.0–36.0)
MCV: 82.9 fL (ref 80.0–100.0)
Monocytes Absolute: 0.3 10*3/uL (ref 0.1–1.0)
Monocytes Relative: 5 %
NEUTROS ABS: 2.4 10*3/uL (ref 1.7–7.7)
NEUTROS PCT: 50 %
NRBC: 0 % (ref 0.0–0.2)
Platelets: 335 10*3/uL (ref 150–400)
RBC: 4.16 MIL/uL (ref 3.87–5.11)
RDW: 15.2 % (ref 11.5–15.5)
WBC: 4.9 10*3/uL (ref 4.0–10.5)

## 2018-07-21 LAB — URINALYSIS, ROUTINE W REFLEX MICROSCOPIC
Bacteria, UA: NONE SEEN
Glucose, UA: NEGATIVE mg/dL
KETONES UR: NEGATIVE mg/dL
Leukocytes, UA: NEGATIVE
Nitrite: NEGATIVE
PH: 6 (ref 5.0–8.0)
PROTEIN: 30 mg/dL — AB
Specific Gravity, Urine: 1.025 (ref 1.005–1.030)

## 2018-07-21 MED ORDER — METOCLOPRAMIDE HCL 10 MG PO TABS: 10.0000 mg | ORAL_TABLET | Freq: Four times a day (QID) | ORAL | 0 refills | Status: DC

## 2018-07-21 NOTE — Progress Notes (Signed)
Written and verbal d/c instructions given and understanding voiced. 

## 2018-07-21 NOTE — Discharge Instructions (Signed)

## 2018-07-21 NOTE — MAU Note (Addendum)
Early tonight saw blood in my urine. Drinking lots of water and not clearing up. Feeling lethargic and weak. Denies vag d/c or bleeding. No dysuria and no pain. On Lovenox for hx 3 PEs in 2018

## 2018-07-21 NOTE — MAU Provider Note (Signed)
History     CSN: [id number]  Arrival date and time: 07/21/18 0225   First Provider Initiated Contact with Patient 07/21/18 0256      Chief Complaint  Patient presents with  . Dizziness   HPI Hayley Webb is a 32 y.o. G3P1011 at 1175w0d who presents feeling dizzy and lethargic. She states this has been ongoing for the last week. She reports worsened morning sickness and not eating or drinking often. She is concerned because she is on Lovenox for a history of PEs and is worried that this is why she feels this way. She has been on Lovenox for 4+ weeks. She also reports blood in her urine. She states it is not vaginal bleeding because there is nothing on the tissue when she wipes. She states she just sees blood in the toilet. She denies any frequency or dysuria. Denies any pain.  OB History    Gravida  3   Para  1   Term  1   Preterm      AB  1   Living  1     SAB  1   TAB      Ectopic      Multiple      Live Births  1           Past Medical History:  Diagnosis Date  . Anxiety    doing good now  . Asthma   . Headache(784.0)   . HSV infection   . Infection    UTI  . Lupus (HCC)    dx age 32  . Pulmonary embolism (HCC) 06/4/82011  . 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 MOUTH SURGERY     2 teeth removed- 1 wisdom and 1 in front of it  . TONSILLECTOMY AND ADENOIDECTOMY  1998  . WISDOM TOOTH EXTRACTION      Family History  Problem Relation Age of Onset  . Diabetes Maternal Aunt   . Cancer Maternal Grandmother 72       COLON CA  . Diabetes Maternal Grandmother   . Hypertension Maternal Grandfather   . Cancer Maternal Grandfather        prostate  . Asthma Mother   . Asthma Brother   . Cancer Paternal Grandmother        breast  . Lupus Paternal Grandmother   . Anesthesia problems Neg Hx   . Hypotension Neg Hx   . Malignant hyperthermia Neg Hx   . Pseudochol deficiency  Neg Hx     Social History   Tobacco Use  . Smoking status: Never Smoker  . Smokeless tobacco: Never Used  Substance Use Topics  . Alcohol use: No  . Drug use: No    Allergies:  Allergies  Allergen Reactions  . Bactrim Anaphylaxis and Hives  . Hydrocodone-Acetaminophen Anaphylaxis  . Nifedipine Swelling and Anaphylaxis    Swelling of tongue  . Pineapple Itching  . Prednisone Anaphylaxis and Hives    Has been on Dexamethasone without issue.  . Shellfish Allergy Itching  . Sulfamethoxazole-Trimethoprim Other (See Comments)    Complications due to lupus  . Nsaids Other (See Comments)    Has Lupus, reccommended to avoid NSAIDs  . Other Itching    Shrimp:throat itching "can eat other shellfish"  . Vicodin [Hydrocodone-Acetaminophen] Hives and Swelling    Can take Percocet without difficulty    Medications Prior to Admission  Medication  Sig Dispense Refill Last Dose  . cholecalciferol (VITAMIN D) 1000 units tablet Take 1,000 Units by mouth daily.   07/20/2018 at Unknown time  . enoxaparin (LOVENOX) 40 MG/0.4ML injection Inject 1.5 mLs (150 mg total) into the skin every 12 (twelve) hours.   07/20/2018 at Unknown time  . hydroxychloroquine (PLAQUENIL) 200 MG tablet Take 100 mg by mouth 2 (two) times daily.   07/20/2018 at Unknown time  . Prenatal Vit-Fe Fumarate-FA (MULTIVITAMIN-PRENATAL) 27-0.8 MG TABS tablet Take 1 tablet by mouth daily at 12 noon.   07/20/2018 at Unknown time  . promethazine (PHENERGAN) 25 MG tablet Take 0.5-1 tablets (12.5-25 mg total) by mouth every 6 (six) hours as needed. 30 tablet 0 Past Week at Unknown time  . albuterol (PROVENTIL HFA;VENTOLIN HFA) 108 (90 Base) MCG/ACT inhaler Inhale 2 puffs into the lungs every 6 (six) hours as needed for wheezing or shortness of breath.   More than a month at Unknown time  . albuterol (PROVENTIL) (2.5 MG/3ML) 0.083% nebulizer solution Take 2.5 mg by nebulization every 6 (six) hours as needed for wheezing or shortness of  breath.   Taking    Review of Systems  Constitutional: Positive for fatigue. Negative for fever.  HENT: Negative.   Respiratory: Negative.  Negative for shortness of breath.   Cardiovascular: Negative.  Negative for chest pain.  Gastrointestinal: Negative.  Negative for abdominal pain, constipation, diarrhea, nausea and vomiting.  Genitourinary: Positive for hematuria. Negative for dysuria, vaginal bleeding and vaginal discharge.  Neurological: Positive for dizziness. Negative for headaches.   Physical Exam   Blood pressure 135/85, pulse 93, temperature 98.6 F (37 C), resp. rate 17, height 5\' 8"  (1.727 m), weight (!) 148.3 kg, last menstrual period 04/18/2018.  Physical Exam  Nursing note and vitals reviewed. Constitutional: She is oriented to person, place, and time. She appears well-developed and well-nourished. No distress.  HENT:  Head: Normocephalic.  Eyes: Pupils are equal, round, and reactive to light.  Cardiovascular: Normal rate, regular rhythm and normal heart sounds.  Respiratory: Effort normal and breath sounds normal. No respiratory distress.  GI: Soft. Bowel sounds are normal. She exhibits no distension. There is no abdominal tenderness.  Genitourinary: Cervix exhibits friability.    Genitourinary Comments: Pelvic exam: Cervix pink, visually closed, without lesion, scant white creamy discharge, vaginal walls and external genitalia normal Bimanual exam: Cervix 0/long/high, firm, anterior, neg CMT, uterus nontender, adnexa without tenderness, enlargement, or mass    Neurological: She is alert and oriented to person, place, and time.  Skin: Skin is warm and dry.  Psychiatric: She has a normal mood and affect. Her behavior is normal. Judgment and thought content normal.    MAU Course  Procedures Results for orders placed or performed during the hospital encounter of 07/21/18 (from the past 24 hour(s))  Urinalysis, Routine w reflex microscopic     Status: Abnormal    Collection Time: 07/21/18  2:45 AM  Result Value Ref Range   Color, Urine YELLOW YELLOW   APPearance CLEAR CLEAR   Specific Gravity, Urine 1.025 1.005 - 1.030   pH 6.0 5.0 - 8.0   Glucose, UA NEGATIVE NEGATIVE mg/dL   Hgb urine dipstick SMALL (A) NEGATIVE   Bilirubin Urine SMALL (A) NEGATIVE   Ketones, ur NEGATIVE NEGATIVE mg/dL   Protein, ur 30 (A) NEGATIVE mg/dL   Nitrite NEGATIVE NEGATIVE   Leukocytes, UA NEGATIVE NEGATIVE   RBC / HPF 6-10 0 - 5 RBC/hpf   WBC, UA 0-5 0 - 5  WBC/hpf   Bacteria, UA NONE SEEN NONE SEEN   Squamous Epithelial / LPF 0-5 0 - 5   Mucus PRESENT   CBC with Differential/Platelet     Status: Abnormal   Collection Time: 07/21/18  3:23 AM  Result Value Ref Range   WBC 4.9 4.0 - 10.5 K/uL   RBC 4.16 3.87 - 5.11 MIL/uL   Hemoglobin 11.5 (L) 12.0 - 15.0 g/dL   HCT 96.234.5 (L) 95.236.0 - 84.146.0 %   MCV 82.9 80.0 - 100.0 fL   MCH 27.6 26.0 - 34.0 pg   MCHC 33.3 30.0 - 36.0 g/dL   RDW 32.415.2 40.111.5 - 02.715.5 %   Platelets 335 150 - 400 K/uL   nRBC 0.0 0.0 - 0.2 %   Neutrophils Relative % 50 %   Neutro Abs 2.4 1.7 - 7.7 K/uL   Lymphocytes Relative 41 %   Lymphs Abs 2.0 0.7 - 4.0 K/uL   Monocytes Relative 5 %   Monocytes Absolute 0.3 0.1 - 1.0 K/uL   Eosinophils Relative 4 %   Eosinophils Absolute 0.2 0.0 - 0.5 K/uL   Basophils Relative 0 %   Basophils Absolute 0.0 0.0 - 0.1 K/uL  Comprehensive metabolic panel     Status: Abnormal   Collection Time: 07/21/18  3:23 AM  Result Value Ref Range   Sodium 133 (L) 135 - 145 mmol/L   Potassium 3.2 (L) 3.5 - 5.1 mmol/L   Chloride 105 98 - 111 mmol/L   CO2 21 (L) 22 - 32 mmol/L   Glucose, Bld 104 (H) 70 - 99 mg/dL   BUN 8 6 - 20 mg/dL   Creatinine, Ser 2.530.54 0.44 - 1.00 mg/dL   Calcium 8.6 (L) 8.9 - 10.3 mg/dL   Total Protein 7.0 6.5 - 8.1 g/dL   Albumin 3.3 (L) 3.5 - 5.0 g/dL   AST 22 15 - 41 U/L   ALT 25 0 - 44 U/L   Alkaline Phosphatase 66 38 - 126 U/L   Total Bilirubin 0.3 0.3 - 1.2 mg/dL   GFR calc non Af Amer >60  >60 mL/min   GFR calc Af Amer >60 >60 mL/min   Anion gap 7 5 - 15   MDM UA CBC with Diff CMP Limited bedside u/s shows IUP with FHR 170 bpm  Discussed with patient slightly low potassium. Patient does not feel like she can tolerate PO potassium tonight. Reviewed high potassium foods to add to diet.   Assessment and Plan   1. Friable cervix   2. [redacted] weeks gestation of pregnancy   3. Nausea/vomiting in pregnancy   4. Dehydration during pregnancy    -Discharge home in stable condition -Rx for reglan given to patient -Vaginal bleeding precautions discussed -Patient advised to follow-up with [facility] as scheduled for prenatal care -Patient may return to MAU as needed or if her condition were to change or worsen  Rolm BookbinderCaroline M Shivaan Tierno [name] 07/21/2018, 3:17 AM

## 2018-07-24 ENCOUNTER — Emergency Department (HOSPITAL_COMMUNITY)
Admission: EM | Admit: 2018-07-24 | Discharge: 2018-07-24 | Disposition: A | Discharge status: Home / Self Care | Payer: 59 | Attending: Emergency Medicine | Admitting: Emergency Medicine

## 2018-07-24 ENCOUNTER — Emergency Department (HOSPITAL_COMMUNITY): Payer: 59

## 2018-07-24 ENCOUNTER — Other Ambulatory Visit: Payer: Self-pay

## 2018-07-24 ENCOUNTER — Encounter (HOSPITAL_COMMUNITY): Payer: Self-pay | Admitting: Emergency Medicine

## 2018-07-24 DIAGNOSIS — O98519 Other viral diseases complicating pregnancy, unspecified trimester: Secondary | ICD-10-CM | POA: Insufficient documentation

## 2018-07-24 DIAGNOSIS — Z3A11 11 weeks gestation of pregnancy: Secondary | ICD-10-CM | POA: Diagnosis not present

## 2018-07-24 DIAGNOSIS — Z79899 Other long term (current) drug therapy: Secondary | ICD-10-CM | POA: Diagnosis not present

## 2018-07-24 DIAGNOSIS — B349 Viral infection, unspecified: Secondary | ICD-10-CM | POA: Insufficient documentation

## 2018-07-24 DIAGNOSIS — O9989 Other specified diseases and conditions complicating pregnancy, childbirth and the puerperium: Secondary | ICD-10-CM | POA: Diagnosis not present

## 2018-07-24 DIAGNOSIS — R079 Chest pain, unspecified: Secondary | ICD-10-CM | POA: Diagnosis not present

## 2018-07-24 DIAGNOSIS — J45909 Unspecified asthma, uncomplicated: Secondary | ICD-10-CM | POA: Insufficient documentation

## 2018-07-24 DIAGNOSIS — R52 Pain, unspecified: Secondary | ICD-10-CM | POA: Diagnosis present

## 2018-07-24 DIAGNOSIS — Z7901 Long term (current) use of anticoagulants: Secondary | ICD-10-CM | POA: Diagnosis not present

## 2018-07-24 DIAGNOSIS — O26891 Other specified pregnancy related conditions, first trimester: Secondary | ICD-10-CM | POA: Diagnosis not present

## 2018-07-24 DIAGNOSIS — R109 Unspecified abdominal pain: Secondary | ICD-10-CM | POA: Diagnosis not present

## 2018-07-24 LAB — COMPREHENSIVE METABOLIC PANEL
ALK PHOS: 66 U/L (ref 38–126)
ALT: 26 U/L (ref 0–44)
AST: 26 U/L (ref 15–41)
Albumin: 3.7 g/dL (ref 3.5–5.0)
Anion gap: 11 (ref 5–15)
BUN: 7 mg/dL (ref 6–20)
CO2: 19 mmol/L — ABNORMAL LOW (ref 22–32)
Calcium: 9.2 mg/dL (ref 8.9–10.3)
Chloride: 104 mmol/L (ref 98–111)
Creatinine, Ser: 0.59 mg/dL (ref 0.44–1.00)
GFR calc Af Amer: >60 mL/min (ref >60)
GFR calc non Af Amer: >60 mL/min (ref >60)
Glucose, Bld: 83 mg/dL (ref 70–99)
Potassium: 4 mmol/L (ref 3.5–5.1)
Sodium: 134 mmol/L — ABNORMAL LOW (ref 135–145)
Total Bilirubin: 0.6 mg/dL (ref 0.3–1.2)
Total Protein: 7.8 g/dL (ref 6.5–8.1)

## 2018-07-24 LAB — URINALYSIS, ROUTINE W REFLEX MICROSCOPIC
Bilirubin Urine: NEGATIVE
Glucose, UA: NEGATIVE mg/dL
Ketones, ur: 5 mg/dL — AB
Leukocytes, UA: NEGATIVE
Nitrite: NEGATIVE
Protein, ur: 30 mg/dL — AB
Specific Gravity, Urine: 1.036 — ABNORMAL HIGH (ref 1.005–1.030)
pH: 5 (ref 5.0–8.0)

## 2018-07-24 LAB — CBC WITH DIFFERENTIAL/PLATELET
Abs Immature Granulocytes: 0.02 10*3/uL (ref 0.00–0.07)
Basophils Absolute: 0 10*3/uL (ref 0.0–0.1)
Basophils Relative: 0 %
Eosinophils Absolute: 0.1 10*3/uL (ref 0.0–0.5)
Eosinophils Relative: 3 %
HEMATOCRIT: 38.2 % (ref 36.0–46.0)
HEMOGLOBIN: 12.6 g/dL (ref 12.0–15.0)
Immature Granulocytes: 0 %
LYMPHS ABS: 2 10*3/uL (ref 0.7–4.0)
LYMPHS PCT: 41 %
MCH: 27.5 pg (ref 26.0–34.0)
MCHC: 33 g/dL (ref 30.0–36.0)
MCV: 83.4 fL (ref 80.0–100.0)
Monocytes Absolute: 0.4 10*3/uL (ref 0.1–1.0)
Monocytes Relative: 8 %
NRBC: 0 % (ref 0.0–0.2)
Neutro Abs: 2.4 10*3/uL (ref 1.7–7.7)
Neutrophils Relative %: 48 %
Platelets: 355 10*3/uL (ref 150–400)
RBC: 4.58 MIL/uL (ref 3.87–5.11)
RDW: 15.1 % (ref 11.5–15.5)
WBC: 5 10*3/uL (ref 4.0–10.5)

## 2018-07-24 LAB — INFLUENZA PANEL BY PCR (TYPE A & B)
Influenza A By PCR: NEGATIVE
Influenza B By PCR: NEGATIVE

## 2018-07-24 MED ORDER — SODIUM CHLORIDE 0.9 % IV BOLUS
1000.0000 mL | Freq: Once | INTRAVENOUS | Status: AC
  Administered 2018-07-24: 1000 mL via INTRAVENOUS

## 2018-07-24 NOTE — Discharge Instructions (Signed)
It was my pleasure taking care of you today!   Call your OBGYN if you are not feeling better by Friday.   Tylenol as needed for pain.  Ocean nasal spray can help with nasal congestion.  Benadryl is safe to use for runny nose. Robitussin for cough.   Return to ER for new or worsening symptoms, any additional concerns.

## 2018-07-24 NOTE — L&D Delivery Note (Signed)
Delivery Note Patient pushed for less than 10 minutes after she was noted to be C/C/+3.  At 4:46 AM a viable and healthy female was delivered via Vaginal, Spontaneous (Presentation:ROA).  APGAR: 7/8; weight 6 lb 5.1 oz (2865 g).  Shoulders and body were easily delivered.  Baby was laid on maternal abdomen and noted to be moving all four extremities.  Cry was weak, and grunting present, so brought to warmer after cord clamped and cut by Father.  Placenta spontaneously delivered intact, possible abruption seen so sent to Pathology.  Uterine atony present, alleviated with massage and IV pitocin, magnesium held temporarily.  Small periurethral lac bleeding so interrupted sutures of 3-0 vicryl placed. Hemostasis achieved.  Patient tolerated delivery well, no complications.   Specimen: Cord Blood, Placenta to Path  Anesthesia:  Epidural Episiotomy: None Lacerations: Periurethral Suture Repair: 3.0 chromic Est. Blood Loss (mL):  @200  mL (awaiting final tally) Mom to postpartum.  Baby to Couplet care / Skin to Skin.  Hayley Webb, [name] 01/15/2019, 5:08 AM

## 2018-07-24 NOTE — ED Provider Notes (Addendum)
[facility]-EMERGENCY DEPT Provider Note   CSN: [id number] Arrival date & time: 07/24/18  0429     History   Chief Complaint Chief Complaint  Patient presents with  . Generalized Body Aches    HPI Hayley Webb is a 33 y.o. female.  The history is provided by the patient and medical records. No language interpreter was used.   Hayley Webb is a 33 y.o. female with history of antiphospholipid antibody syndrome currently [redacted] weeks pregnant on Lovenox 150 twice daily, asthma who presents to the Emergency Department complaining of cough, congestion, generalized body aches and central chest pain which is worse with cough.  Symptoms began 3 days ago and have been persistent.  No improvement or worsening.  Denies any sick contacts.  No medications taken prior to arrival.  She has had an initial ultrasound confirming IUP.  She reports some lower abdominal cramping.  Nausea, but no worse than she has had for several weeks attributed to her pregnancy.  She has Phenergan which she takes at home with adequate relief.  Denies any vomiting in the last few days.  No fever.  No difficulty breathing.  She has been compliant with her anticoagulation.  Denies any vaginal bleeding, leakage of fluids or discharge.  No urinary symptoms.  Past Medical History:  Diagnosis Date  . Anxiety    doing good now  . Asthma   . Headache(784.0)   . HSV infection   . Infection    UTI  . Lupus (HCC)    dx age 18  . Pulmonary embolism (HCC) 06/4/82011  . 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 Chest pain 04/04/2017  . On continuous oral anticoagulation 04/04/2017  . 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 Nonintractable headache 04/04/2017  . Altered thought processes 04/04/2017  . Word finding difficulty 04/04/2017  . Antiphospholipid antibody syndrome (HCC) 02/10/2017  . Pulmonary embolus (HCC)  02/09/2017  . Pulmonary embolism (HCC) 12/26/2016  . Shortness of breath   . Vaginal delivery--VE assist 10/25/2013  . 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 Obesity-BMI 47 05/09/2013  . Hirsutism 10/02/2012  . STD (female) 01/18/2012  . Lupus (HCC)   . Asthma   . HSV infection     Past Surgical History:  Procedure Laterality Date  . MOUTH SURGERY     2 teeth removed- 1 wisdom and 1 in front of it  . TONSILLECTOMY AND ADENOIDECTOMY  1998  . WISDOM TOOTH EXTRACTION       OB History    Gravida  3   Para  1   Term  1   Preterm      AB  1   Living  1     SAB  1   TAB      Ectopic      Multiple      Live Births  1            Home Medications    Prior to Admission medications   Medication Sig Start Date End Date Taking? Authorizing Provider  albuterol (PROVENTIL HFA;VENTOLIN HFA) 108 (90 Base) MCG/ACT inhaler Inhale 2 puffs into the lungs every 6 (six) hours as needed for wheezing or shortness of breath.   Yes [provider]  albuterol (PROVENTIL) (2.5 MG/3ML) 0.083% nebulizer solution Take 2.5 mg by nebulization every 6 (  six) hours as needed for wheezing or shortness of breath.   Yes [provider]  cholecalciferol (VITAMIN D) 1000 units tablet Take 1,000 Units by mouth daily.   Yes [provider]  enoxaparin (LOVENOX) 40 MG/0.4ML injection Inject 1.5 mLs (150 mg total) into the skin every 12 (twelve) hours. 07/08/18  Yes Serena CroissantGudena, Vinay, MD  hydroxychloroquine (PLAQUENIL) 200 MG tablet Take 100 mg by mouth 2 (two) times daily.   Yes [provider]  Prenatal Vit-Fe Fumarate-FA (MULTIVITAMIN-PRENATAL) 27-0.8 MG TABS tablet Take 1 tablet by mouth daily at 12 noon.   Yes [provider]  promethazine (PHENERGAN) 25 MG tablet Take 0.5-1 tablets (12.5-25 mg total) by mouth every 6 (six) hours as needed. 06/26/18  Yes Thressa ShellerHogan, Heather D, [name]    metoCLOPramide (REGLAN) 10 MG tablet Take 1 tablet (10 mg total) by mouth every 6 (six) hours. 07/21/18   Rolm BookbinderNeill, Caroline M, [name]    Family History Family History  Problem Relation Age of Onset  . Diabetes Maternal Aunt   . Cancer Maternal Grandmother 72       COLON CA  . Diabetes Maternal Grandmother   . Hypertension Maternal Grandfather   . Cancer Maternal Grandfather        prostate  . Asthma Mother   . Asthma Brother   . Cancer Paternal Grandmother        breast  . Lupus Paternal Grandmother   . Anesthesia problems Neg Hx   . Hypotension Neg Hx   . Malignant hyperthermia Neg Hx   . Pseudochol deficiency Neg Hx     Social History Social History   Tobacco Use  . Smoking status: Never Smoker  . Smokeless tobacco: Never Used  Substance Use Topics  . Alcohol use: No  . Drug use: No     Allergies   Bactrim; Hydrocodone-acetaminophen; Nifedipine; Pineapple; Prednisone; Shellfish allergy; Sulfamethoxazole-trimethoprim; Nsaids; Other; and Vicodin [hydrocodone-acetaminophen]   Review of Systems Review of Systems  Constitutional: Positive for chills. Negative for fever.  HENT: Positive for congestion.   Respiratory: Positive for cough. Negative for shortness of breath and wheezing.   Cardiovascular: Positive for chest pain. Negative for palpitations and leg swelling.  Gastrointestinal: Positive for abdominal pain and nausea.  Musculoskeletal: Positive for myalgias.  All other systems reviewed and are negative.    Physical Exam Updated Vital Signs BP (!) 148/95 (BP Location: Left Arm)   Pulse 83   Temp 98.7 F (37.1 C) (Oral)   Resp 16   Ht 5\' 8"  (1.727 m)   Wt (!) 148.3 kg   LMP 04/18/2018 (Exact Date)   SpO2 99%   BMI 49.71 kg/m   Physical Exam Vitals signs and nursing note reviewed.  Constitutional:      General: She is not in acute distress.    Appearance: She is well-developed.     Comments: Well-appearing.  HENT:     Head: Normocephalic and  atraumatic.  Cardiovascular:     Rate and Rhythm: Normal rate and regular rhythm.     Heart sounds: Normal heart sounds. No murmur.  Pulmonary:     Effort: Pulmonary effort is normal. No respiratory distress.     Breath sounds: Normal breath sounds.     Comments: Lungs clear to auscultation bilaterally. Abdominal:     General: There is no distension.     Palpations: Abdomen is soft.     Comments: Mild, diffuse tenderness across the lower abdomen without rebound or guarding.  Skin:  General: Skin is warm and dry.  Neurological:     Mental Status: She is alert and oriented to person, place, and time.      ED Treatments / Results  Labs (all labs ordered are listed, but only abnormal results are displayed) Labs Reviewed  COMPREHENSIVE METABOLIC PANEL - Abnormal; Notable for the following components:      Result Value   Sodium 134 (*)    CO2 19 (*)    All other components within normal limits  URINALYSIS, ROUTINE W REFLEX MICROSCOPIC - Abnormal; Notable for the following components:   APPearance HAZY (*)    Specific Gravity, Urine 1.036 (*)    Hgb urine dipstick MODERATE (*)    Ketones, ur 5 (*)    Protein, ur 30 (*)    Bacteria, UA RARE (*)    All other components within normal limits  INFLUENZA PANEL BY PCR (TYPE A & B)  CBC WITH DIFFERENTIAL/PLATELET  I-STAT TROPONIN, ED    EKG None  ED ECG REPORT   Date: 07/24/2018  Rate: 85  Rhythm: normal sinus rhythm  QRS Axis: normal  Intervals: normal  ST/T Wave abnormalities: normal  Conduction Disutrbances:none  Narrative Interpretation: No Stemi  I have personally reviewed the EKG tracing with attending and we agree with the computerized printout as noted.   Radiology [place] Ob Comp < 14 Wks  Result Date: 07/24/2018 CLINICAL DATA:  First trimester pregnancy, abdominal pain. EXAM: OBSTETRIC <14 WK ULTRASOUND TECHNIQUE: Transabdominal ultrasound was performed for evaluation of the gestation as well as the maternal  uterus and adnexal regions. COMPARISON:  Ultrasound of June 26, 2018. FINDINGS: Intrauterine gestational sac: Single visualized. Yolk sac:  Not visualized. Embryo:  Visualized. Cardiac Activity: Visualized. Heart Rate: 156 bpm CRL:   46.7 mm   11 w 3 d                  US EDC: February 09, 2019. Subchorionic hemorrhage:  None visualized. Maternal uterus/adnexae: No free fluid is noted. Ovaries are not visualized. Probable 2.6 cm fibroid is noted. IMPRESSION: Single live intrauterine gestation of 11 weeks 3 days. Electronically Signed   By: Lupita RaiderJames  Green Jr, M.D.   On: 07/24/2018 09:05    Procedures Procedures (including critical care time)  Medications Ordered in ED Medications  sodium chloride 0.9 % bolus 1,000 mL (0 mLs Intravenous Stopped 07/24/18 0904)     Initial Impression / Assessment and Plan / ED Course  I have reviewed the triage vital signs and the nursing notes.  Pertinent labs & imaging results that were available during my care of the patient were reviewed by me and considered in my medical decision making (see chart for details).    Purvis Sheffieldndrea Foster is a 33 y.o. female who presents to ED for generalized body aches, chills, cough, congestion and chest discomfort x 3 days. She is currently [redacted] weeks pregnant. Has had some lower abdominal cramping, but no vaginal bleeding/leakage of fluids. Ultrasound today given abdominal pain which showed singling live IUP at 11 weeks 3 days without complication. Flu negative. Labs reassuring. She does have hx of antiphospholipid antibody syndrome.  She is currently on Lovenox injections.  Initially had a heart rate of 103, however regular rate on examination.  Repeat heart rate of 83. PE considered, however feel this is unlikely given her constellation of viral symptoms as well as the fact that she is currently on anticoagulation and has been compliant with her medication regimen.  Discussed with  attending, Dr. Adriana Simas, who is in agreement.  UA without any  signs of infection.  Adequately hydrated in the emergency department.  On reevaluation, she feels much improved.  She is tolerating p.o.  Reasons to return to the emergency department were discussed.  Call OB/GYN to schedule follow-up if she is still not feeling well in 2 days.  All questions were answered.  Patient seen by and discussed with Dr. Adriana Simas who agrees with treatment plan.    Final Clinical Impressions(s) / ED Diagnoses   Final diagnoses:  Viral illness    ED Discharge Orders    None       Toneka Fullen, Chase Picket, PA-C 07/24/18 1024    Donnetta Hutching, MD 07/25/18 [phone]

## 2018-07-24 NOTE — ED Triage Notes (Signed)
Pt reports having generalized body aches and lower abdominal cramping for the last few days. Pt reports to being [redacted] weeks pregnant.

## 2018-07-25 DIAGNOSIS — Z3401 Encounter for supervision of normal first pregnancy, first trimester: Secondary | ICD-10-CM | POA: Diagnosis not present

## 2018-07-25 DIAGNOSIS — O3680X9 Pregnancy with inconclusive fetal viability, other fetus: Secondary | ICD-10-CM | POA: Diagnosis not present

## 2018-07-25 DIAGNOSIS — M329 Systemic lupus erythematosus, unspecified: Secondary | ICD-10-CM | POA: Diagnosis not present

## 2018-07-25 DIAGNOSIS — O10919 Unspecified pre-existing hypertension complicating pregnancy, unspecified trimester: Secondary | ICD-10-CM

## 2018-07-25 DIAGNOSIS — Z3A11 11 weeks gestation of pregnancy: Secondary | ICD-10-CM | POA: Diagnosis not present

## 2018-07-25 HISTORY — DX: Unspecified pre-existing hypertension complicating pregnancy, unspecified trimester: O10.919

## 2018-08-21 ENCOUNTER — Encounter (HOSPITAL_COMMUNITY): Payer: Self-pay

## 2018-08-21 ENCOUNTER — Inpatient Hospital Stay (HOSPITAL_COMMUNITY)
Admission: AD | Admit: 2018-08-21 | Discharge: 2018-08-21 | Disposition: A | Discharge status: Home / Self Care | Payer: 59 | Source: Ambulatory Visit | Attending: Obstetrics & Gynecology | Admitting: Obstetrics & Gynecology

## 2018-08-21 DIAGNOSIS — Z7901 Long term (current) use of anticoagulants: Secondary | ICD-10-CM | POA: Insufficient documentation

## 2018-08-21 DIAGNOSIS — Z86711 Personal history of pulmonary embolism: Secondary | ICD-10-CM | POA: Diagnosis not present

## 2018-08-21 DIAGNOSIS — Z7982 Long term (current) use of aspirin: Secondary | ICD-10-CM | POA: Insufficient documentation

## 2018-08-21 DIAGNOSIS — Z3A15 15 weeks gestation of pregnancy: Secondary | ICD-10-CM | POA: Diagnosis not present

## 2018-08-21 DIAGNOSIS — M3219 Other organ or system involvement in systemic lupus erythematosus: Secondary | ICD-10-CM | POA: Diagnosis not present

## 2018-08-21 DIAGNOSIS — M329 Systemic lupus erythematosus, unspecified: Secondary | ICD-10-CM | POA: Diagnosis not present

## 2018-08-21 DIAGNOSIS — O9989 Other specified diseases and conditions complicating pregnancy, childbirth and the puerperium: Secondary | ICD-10-CM | POA: Insufficient documentation

## 2018-08-21 DIAGNOSIS — O26812 Pregnancy related exhaustion and fatigue, second trimester: Secondary | ICD-10-CM | POA: Insufficient documentation

## 2018-08-21 LAB — COMPREHENSIVE METABOLIC PANEL
ALT: 15 U/L (ref 0–44)
AST: 16 U/L (ref 15–41)
Albumin: 3.4 g/dL — ABNORMAL LOW (ref 3.5–5.0)
Alkaline Phosphatase: 58 U/L (ref 38–126)
Anion gap: 8 (ref 5–15)
BUN: 5 mg/dL — ABNORMAL LOW (ref 6–20)
CHLORIDE: 102 mmol/L (ref 98–111)
CO2: 22 mmol/L (ref 22–32)
Calcium: 9.1 mg/dL (ref 8.9–10.3)
Creatinine, Ser: 0.62 mg/dL (ref 0.44–1.00)
GFR calc Af Amer: >60 mL/min (ref >60)
GFR calc non Af Amer: >60 mL/min (ref >60)
Glucose, Bld: 86 mg/dL (ref 70–99)
Potassium: 3.6 mmol/L (ref 3.5–5.1)
Sodium: 132 mmol/L — ABNORMAL LOW (ref 135–145)
Total Bilirubin: 0.4 mg/dL (ref 0.3–1.2)
Total Protein: 7.7 g/dL (ref 6.5–8.1)

## 2018-08-21 LAB — CBC
HCT: 36.7 % (ref 36.0–46.0)
Hemoglobin: 12.3 g/dL (ref 12.0–15.0)
MCH: 28.1 pg (ref 26.0–34.0)
MCHC: 33.5 g/dL (ref 30.0–36.0)
MCV: 83.8 fL (ref 80.0–100.0)
Platelets: 281 10*3/uL (ref 150–400)
RBC: 4.38 MIL/uL (ref 3.87–5.11)
RDW: 14.6 % (ref 11.5–15.5)
WBC: 5.2 10*3/uL (ref 4.0–10.5)
nRBC: 0 % (ref 0.0–0.2)

## 2018-08-21 LAB — URINALYSIS, ROUTINE W REFLEX MICROSCOPIC
Bilirubin Urine: NEGATIVE
Glucose, UA: NEGATIVE mg/dL
Ketones, ur: NEGATIVE mg/dL
Nitrite: NEGATIVE
PH: 6 (ref 5.0–8.0)
Protein, ur: NEGATIVE mg/dL
SPECIFIC GRAVITY, URINE: 1.013 (ref 1.005–1.030)

## 2018-08-21 MED ORDER — ASPIRIN EC 81 MG PO TBEC: 81.0000 mg | DELAYED_RELEASE_TABLET | Freq: Every day | ORAL | 3 refills | Status: DC

## 2018-08-21 MED ORDER — DEXAMETHASONE SODIUM PHOSPHATE 10 MG/ML IJ SOLN
20.0000 mg | Freq: Once | INTRAMUSCULAR | Status: AC
  Administered 2018-08-21: 20 mg via INTRAMUSCULAR
  Filled 2018-08-21: qty 2

## 2018-08-21 MED ORDER — TRAMADOL HCL 50 MG PO TABS: 50.0000 mg | ORAL_TABLET | Freq: Four times a day (QID) | ORAL | 0 refills | Status: DC | PRN

## 2018-08-21 NOTE — MAU Provider Note (Signed)
[facility] Attending MAU Note  Chief Complaint: Abdominal Pain and Generalized Body Aches    First Provider Initiated Contact with Patient 08/21/18 1827      SUBJECTIVE Hayley Webb is a 33 y.o. G3P1011 at [redacted]w[redacted]d by LMP who presents with Reports feeling exhausted. Has trouble with joints moving, cannot lift head, back. Notes some lower abdominal pain. Denies rash. Has h/o SLE. Diagnosed with this on 10/26/2010. Had ongoing fever. Dr. Dawna Part treats her Lupus. On plaquenil 400 mg daily. On medrol 4 mg daily. Initially on 100 mg plaquenil and 2 mg medrol until initial flare started 4-5 wks ago. Increased meds by rheum a few weeks ago but symptoms have continued, to worsened. Has had some kidney issues in the past. Has had h/o PE x 3. On Lovenox bid. Normal kidney function this pregnancy. Has seen MFM.  Past Medical History:  Diagnosis Date  . Anxiety    doing good now  . Asthma   . Headache(784.0)   . HSV infection   . Infection    UTI  . Lupus (HCC)    dx age 43  . Pulmonary embolism (HCC) 06/4/82011  . STD (sexually transmitted disease) 02/2009   POSITIVE GC   OB History  Gravida Para Term Preterm AB Living  3 1 1   1 1   SAB TAB Ectopic Multiple Live Births  1       1    # Outcome Date GA Lbr Len/2nd Weight Sex Delivery Anes PTL Lv  3 Current           2 Term 10/25/13 [redacted]w[redacted]d 09:43 / 00:14  M Vag-Vacuum EPI  LIV  1 SAB            Past Surgical History:  Procedure Laterality Date  . ADENOIDECTOMY    . TONSILLECTOMY AND ADENOIDECTOMY  1998  . WISDOM TOOTH EXTRACTION     Social History   Socioeconomic History  . Marital status: Single    Spouse name: Not on file  . Number of children: Not on file  . Years of education: Not on file  . Highest education level: Not on file  Occupational History  . Not on file  Social Needs  . Financial resource strain: Not on file  . Food insecurity:    Worry: Not on file    Inability: Not on file  . Transportation  needs:    Medical: Not on file    Non-medical: Not on file  Tobacco Use  . Smoking status: Never Smoker  . Smokeless tobacco: Never Used  Substance and Sexual Activity  . Alcohol use: No  . Drug use: No  . Sexual activity: Yes    Partners: Male    Birth control/protection: None  Lifestyle  . Physical activity:    Days per week: Not on file    Minutes per session: Not on file  . Stress: Not on file  Relationships  . Social connections:    Talks on phone: Not on file    Gets together: Not on file    Attends religious service: Not on file    Active member of club or organization: Not on file    Attends meetings of clubs or organizations: Not on file    Relationship status: Not on file  . Intimate partner violence:    Fear of current or ex partner: Not on file    Emotionally abused: Not on file    Physically abused: Not on file  Forced sexual activity: Not on file  Other Topics Concern  . Not on file  Social History Narrative  . Not on file   No current facility-administered medications on file prior to encounter.    Current Outpatient Medications on File Prior to Encounter  Medication Sig Dispense Refill  . albuterol (PROVENTIL HFA;VENTOLIN HFA) 108 (90 Base) MCG/ACT inhaler Inhale 2 puffs into the lungs every 6 (six) hours as needed for wheezing or shortness of breath.    Marland Kitchen albuterol (PROVENTIL) (2.5 MG/3ML) 0.083% nebulizer solution Take 2.5 mg by nebulization every 6 (six) hours as needed for wheezing or shortness of breath.    . cholecalciferol (VITAMIN D) 1000 units tablet Take 1,000 Units by mouth daily.    . Doxylamine-Pyridoxine ER (BONJESTA) 20-20 MG TBCR Bonjesta 20 mg-20 mg tablet,immediate and delay release  Take 1 tablet twice a day by oral route.    . enoxaparin (LOVENOX) 40 MG/0.4ML injection Inject 1.5 mLs (150 mg total) into the skin every 12 (twelve) hours.    . hydroxychloroquine (PLAQUENIL) 200 MG tablet Take 100 mg by mouth 2 (two) times daily.    Marland Kitchen  labetalol (NORMODYNE) 200 MG tablet labetalol 200 mg tablet  TAKE 1 TABLET BY MOUTH TWICE DAILY    . metoCLOPramide (REGLAN) 10 MG tablet Take 1 tablet (10 mg total) by mouth every 6 (six) hours. 30 tablet 0  . ondansetron (ZOFRAN) 4 MG tablet Take 8 mg by mouth 2 (two) times daily.    . Prenatal Vit-Fe Fumarate-FA (MULTIVITAMIN-PRENATAL) 27-0.8 MG TABS tablet Take 1 tablet by mouth daily at 12 noon.    . promethazine (PHENERGAN) 25 MG tablet Take 0.5-1 tablets (12.5-25 mg total) by mouth every 6 (six) hours as needed. 30 tablet 0  . terconazole (TERAZOL 7) 0.4 % vaginal cream terconazole 0.4 % vaginal cream  Insert 1 applicatorful every day by vaginal route for 7 days.     Allergies  Allergen Reactions  . Bactrim Anaphylaxis and Hives  . Hydrocodone-Acetaminophen Anaphylaxis  . Nifedipine Swelling and Anaphylaxis    Swelling of tongue  . Pineapple Itching  . Prednisone Anaphylaxis and Hives    Has been on Dexamethasone without issue.  . Shellfish Allergy Itching  . Sulfamethoxazole-Trimethoprim Other (See Comments)    Complications due to lupus  . Nsaids Other (See Comments)    Has Lupus, reccommended to avoid NSAIDs  . Other Itching    Shrimp:throat itching "can eat other shellfish"  . Vicodin [Hydrocodone-Acetaminophen] Hives and Swelling    Can take Percocet without difficulty    ROS: Pertinent items in HPI  OBJECTIVE BP (!) 135/95 (BP Location: Right Arm)   Pulse 93   Temp (!) 97.4 F (36.3 C) (Oral)   Resp 18   Wt (!) 146.1 kg   LMP 04/18/2018 (Exact Date)   BMI 48.96 kg/m  CONSTITUTIONAL: Well-developed, well-nourished female in no acute distress.  HENT:  Normocephalic, atraumatic, External right and left ear normal. Oropharynx is clear and moist EYES: Conjunctivae and EOM are normal. No scleral icterus.  NECK: Normal range of motion, supple, no masses.  Normal thyroid.  SKIN: Skin is warm and dry. No rash noted. Not diaphoretic. No erythema. No  pallor. NEUROLGIC: Alert and oriented to person, place, and time.  PSYCHIATRIC: Normal mood and affect. Normal behavior. Normal judgment and thought content. CARDIOVASCULAR: Normal heart rate noted RESPIRATORY: Effort and breath sounds normal, no problems with respiration noted. ABDOMEN: Soft, normal bowel sounds, no distention noted.  No tenderness,  rebound or guarding.  MUSCULOSKELETAL: Normal range of motion. No tenderness.  No cyanosis, clubbing, or edema.  2+ distal pulses.  LAB RESULTS Results for orders placed or performed during the hospital encounter of 08/21/18 (from the past 48 hour(s))  Urinalysis, Routine w reflex microscopic     Status: Abnormal   Collection Time: 08/21/18  6:07 PM  Result Value Ref Range   Color, Urine YELLOW YELLOW   APPearance CLEAR CLEAR   Specific Gravity, Urine 1.013 1.005 - 1.030   pH 6.0 5.0 - 8.0   Glucose, UA NEGATIVE NEGATIVE mg/dL   Hgb urine dipstick SMALL (A) NEGATIVE   Bilirubin Urine NEGATIVE NEGATIVE   Ketones, ur NEGATIVE NEGATIVE mg/dL   Protein, ur NEGATIVE NEGATIVE mg/dL   Nitrite NEGATIVE NEGATIVE   Leukocytes, UA TRACE (A) NEGATIVE   RBC / HPF 0-5 0 - 5 RBC/hpf   WBC, UA 0-5 0 - 5 WBC/hpf   Bacteria, UA RARE (A) NONE SEEN   Squamous Epithelial / LPF 0-5 0 - 5   Mucus PRESENT     Comment: Performed at [facility], [address] [place], [place] [address]  Comprehensive metabolic panel     Status: Abnormal   Collection Time: 08/21/18  7:16 PM  Result Value Ref Range   Sodium 132 (L) 135 - 145 mmol/L   Potassium 3.6 3.5 - 5.1 mmol/L   Chloride 102 98 - 111 mmol/L   CO2 22 22 - 32 mmol/L   Glucose, Bld 86 70 - 99 mg/dL   BUN 5 (L) 6 - 20 mg/dL   Creatinine, Ser 6.040.62 0.44 - 1.00 mg/dL   Calcium 9.1 8.9 - 54.010.3 mg/dL   Total Protein 7.7 6.5 - 8.1 g/dL   Albumin 3.4 (L) 3.5 - 5.0 g/dL   AST 16 15 - 41 U/L   ALT 15 0 - 44 U/L   Alkaline Phosphatase 58 38 - 126 U/L   Total Bilirubin 0.4 0.3 - 1.2 mg/dL   GFR calc non Af  Amer >60 >60 mL/min   GFR calc Af Amer >60 >60 mL/min   Anion gap 8 5 - 15    Comment: Performed at [facility], [address] [place], [place] [address]  CBC     Status: None   Collection Time: 08/21/18  7:16 PM  Result Value Ref Range   WBC 5.2 4.0 - 10.5 K/uL   RBC 4.38 3.87 - 5.11 MIL/uL   Hemoglobin 12.3 12.0 - 15.0 g/dL   HCT 14.736.7 82.936.0 - 56.246.0 %   MCV 83.8 80.0 - 100.0 fL   MCH 28.1 26.0 - 34.0 pg   MCHC 33.5 30.0 - 36.0 g/dL   RDW 13.014.6 86.511.5 - 78.415.5 %   Platelets 281 150 - 400 K/uL   nRBC 0.0 0.0 - 0.2 %    Comment: Performed at [facility], [address] [place], [place] [address]    MAU COURSE Given IM Decadron  ASSESSMENT 1. Other systemic lupus erythematosus with other organ involvement (HCC)     PLAN Discharge home Begin ASA Continue Lovenox and Labetalol Begin Ultram for pain See Rheum about increasing meds--letter written to allow him to adjust as well as other meds considered safe in pregnancy.  Follow-up Information    [facility].   Specialty:  Obstetrics and Gynecology Contact information: [address]  [place] [phone]       Serena CroissantGudena, Vinay, MD Follow up.  Specialty:  Hematology and Oncology Contact information: [address] [place] [address] [phone]          Allergies as of 08/21/2018      Reactions   Bactrim Anaphylaxis, Hives   Hydrocodone-acetaminophen Anaphylaxis   Nifedipine Swelling, Anaphylaxis   Swelling of tongue   Pineapple Itching   Prednisone Anaphylaxis, Hives   Has been on Dexamethasone without issue.   Shellfish Allergy Itching   Sulfamethoxazole-trimethoprim Other (See Comments)   Complications due to lupus   Nsaids Other (See Comments)   Has Lupus, reccommended to avoid NSAIDs   Other Itching   Shrimp:throat itching "can eat other shellfish"   Vicodin [hydrocodone-acetaminophen] Hives,  Swelling   Can take Percocet without difficulty      Medication List    TAKE these medications   albuterol 108 (90 Base) MCG/ACT inhaler Commonly known as:  PROVENTIL HFA;VENTOLIN HFA Inhale 2 puffs into the lungs every 6 (six) hours as needed for wheezing or shortness of breath.   albuterol (2.5 MG/3ML) 0.083% nebulizer solution Commonly known as:  PROVENTIL Take 2.5 mg by nebulization every 6 (six) hours as needed for wheezing or shortness of breath.   aspirin EC 81 MG tablet Take 1 tablet (81 mg total) by mouth daily.   BONJESTA 20-20 MG Tbcr Generic drug:  Doxylamine-Pyridoxine ER Bonjesta 20 mg-20 mg tablet,immediate and delay release  Take 1 tablet twice a day by oral route.   cholecalciferol 1000 units tablet Commonly known as:  VITAMIN D Take 1,000 Units by mouth daily.   enoxaparin 40 MG/0.4ML injection Commonly known as:  LOVENOX Inject 1.5 mLs (150 mg total) into the skin every 12 (twelve) hours.   hydroxychloroquine 200 MG tablet Commonly known as:  PLAQUENIL Take 100 mg by mouth 2 (two) times daily.   labetalol 200 MG tablet Commonly known as:  NORMODYNE labetalol 200 mg tablet  TAKE 1 TABLET BY MOUTH TWICE DAILY   metoCLOPramide 10 MG tablet Commonly known as:  REGLAN Take 1 tablet (10 mg total) by mouth every 6 (six) hours.   multivitamin-prenatal 27-0.8 MG Tabs tablet Take 1 tablet by mouth daily at 12 noon.   ondansetron 4 MG tablet Commonly known as:  ZOFRAN Take 8 mg by mouth 2 (two) times daily.   promethazine 25 MG tablet Commonly known as:  PHENERGAN Take 0.5-1 tablets (12.5-25 mg total) by mouth every 6 (six) hours as needed.   terconazole 0.4 % vaginal cream Commonly known as:  TERAZOL 7 terconazole 0.4 % vaginal cream  Insert 1 applicatorful every day by vaginal route for 7 days.   traMADol 50 MG tablet Commonly known as:  ULTRAM Take 1 tablet (50 mg total) by mouth every 6 (six) hours as needed.        Reva Bores,  MD 08/21/2018 8:33 PM

## 2018-08-21 NOTE — Discharge Instructions (Signed)
Systemic Lupus Erythematosus, Adult  Systemic lupus erythematosus (SLE) is a long-term (chronic) disease that can affect many parts of the body. SLE is an autoimmune disease. With this type of disease, the body's defense system (immune system) mistakenly attacks healthy tissues. This can cause damage to the skin, joints, blood vessels, brain, kidneys, lungs, heart, and other internal organs. It causes pain, irritation, and inflammation.  What are the causes?  The cause of this condition is not known.  What increases the risk?  The following factors may make you more likely to develop this condition:  · Being female.  · Being of Asian, Hispanic, or African-American descent.  · Having a family history of the condition.  · Being exposed to tobacco smoke or smoking cigarettes.  · Having an infection with a virus, such as Epstein-Barr virus.  · Having a history of exposure to silica dust, metals, chemicals, mold or mildew, or insecticides.  · Using oral contraceptives or hormone replacement therapy.  What are the signs or symptoms?  This condition can affect almost any organ or system in the body. Symptoms of the condition depend on which organ or system is affected.  The most common symptoms include:  · Fever.  · Fatigue.  · Weight loss.  · Muscle aches.  · Joint pain.  · Skin rashes, especially over the nose and cheeks (butterfly rash) and after sun exposure.  Symptoms can come and go. A period of time when symptoms get worse or come back is called a flare. A period of time with no symptoms is called a remission.  How is this diagnosed?  This condition is diagnosed based on:  · Your symptoms.  · Your medical history.  · A physical exam.  You may also have tests, including:  · Blood tests.  · Urine tests.  · A chest X-ray.  You may be referred to an autoimmune disease specialist (rheumatologist).  How is this treated?  There is no cure for this condition, but treatment can help to control symptoms, prevent flares (keep  symptoms in remission), and prevent damage to the heart, lungs, kidneys, and other organs. Treatment will depend on what symptoms you are having and what organs or systems are affected. Treatment may involve taking a combination of medicines over time.  Common medicines used to treat this condition include:  · Antimalarial medicines to control symptoms, prevent flares, and protect against organ damage.  · Corticosteroids and NSAIDs to reduce inflammation.  · Medicines to weaken your immune system (immunosuppressants).  · Biologic response modifiers to reduce inflammation and damage.  Follow these instructions at home:  Eating and drinking  · Eat a heart-healthy diet. This may include:  ? Eating high-fiber foods, such as fresh fruits and vegetables, whole grains, and beans.  ? Eating heart-healthy fats (omega-3 fats), such as fish, flaxseed, and flaxseed oil.  ? Limiting foods that are high in saturated fat and cholesterol, such as processed and fried foods, fatty meat, and full-fat dairy.  ? Limiting how much salt (sodium) you eat.  · Include calcium and vitamin D in your diet. Good sources of calcium and vitamin D include:  ? Low-fat dairy products such as milk, yogurt, and cheese.  ? Certain fish, such as fresh or canned salmon, tuna, and sardines.  ? Products that have calcium and vitamin D added to them (fortified products), such as fortified cereals or juice.  Medicines  · Take over-the-counter and prescription medicines only as told by your health   care provider.  · Do not take any medicines that contain estrogen without first checking with your health care provider. Estrogen can trigger flares and may increase your risk for blood clots.  Lifestyle         · Stay active, as directed by your health care provider.  · Do not use any products that contain nicotine or tobacco, such as cigarettes and e-cigarettes. If you need help quitting, ask your health care provider.  · Protect your skin from the sun by applying  sunblock and wearing protective hats and clothing.  · Learn as much as you can about your condition and have a good support system in place. Support may come from family, friends, or a lupus support group.  General instructions  · Work closely with all of your health care providers to manage your condition.  · Stay up to date on all vaccines as directed by your health care provider.  · Keep all follow-up visits as told by your health care provider. This is important.  Contact a health care provider if:  · You have a fever.  · Your symptoms flare.  · You develop new symptoms.  · You have bloody, foamy, or coffee-colored urine.  · There are changes in your urination. For example, you urinate more often at night.  · You think that you may be depressed or have anxiety.  · You become pregnant or plan to become pregnant. Pregnancy in women with this condition is considered high risk.  Get help right away if:  · You have chest pain.  · You have trouble breathing.  · You have a seizure.  · You suddenly get a very bad headache.  · You suddenly develop facial or body weakness.  · You cannot speak.  · You cannot understand speech.  These symptoms may represent a serious problem that is an emergency. Do not wait to see if the symptoms will go away. Get medical help right away. Call your local emergency services (911 in the [place] Do not drive yourself to the hospital.  Summary  · Systemic lupus erythematosus (SLE) is a long-term disease that can affect many parts of the body.  · SLE is an autoimmune disease. That means your body's defense system (immune system) mistakenly attacks healthy tissues.  · There is no cure for this condition, but treatment can help to control symptoms, prevent flares, and prevent damage to your organs. Treatment may involve taking a combination of medicines over time.  This information is not intended to replace advice given to you by your health care provider. Make sure you discuss any questions you  have with your health care provider.  Document Released: 06/30/2002 Document Revised: 08/17/2017 Document Reviewed: 08/17/2017  Elsevier Interactive Patient Education © 2019 [employer]

## 2018-08-21 NOTE — MAU Note (Signed)
Pt C/O generalized body aches & tingling, hx of lupus, "feels like a possible flare-up."  Started having lower abdominal pain today.  Denies bleeding.

## 2018-08-24 ENCOUNTER — Other Ambulatory Visit: Payer: Self-pay

## 2018-08-27 DIAGNOSIS — M3500 Sicca syndrome, unspecified: Secondary | ICD-10-CM | POA: Insufficient documentation

## 2018-08-27 HISTORY — DX: Sjogren syndrome, unspecified: M35.00

## 2018-09-02 ENCOUNTER — Other Ambulatory Visit: Payer: Self-pay

## 2018-09-02 ENCOUNTER — Encounter (HOSPITAL_COMMUNITY): Payer: Self-pay

## 2018-09-02 ENCOUNTER — Other Ambulatory Visit (HOSPITAL_COMMUNITY): Payer: Self-pay | Admitting: *Deleted

## 2018-09-02 DIAGNOSIS — M329 Systemic lupus erythematosus, unspecified: Secondary | ICD-10-CM

## 2018-09-02 DIAGNOSIS — O9989 Other specified diseases and conditions complicating pregnancy, childbirth and the puerperium: Principal | ICD-10-CM

## 2018-09-02 DIAGNOSIS — O99891 Other specified diseases and conditions complicating pregnancy: Secondary | ICD-10-CM

## 2018-09-03 ENCOUNTER — Other Ambulatory Visit (HOSPITAL_COMMUNITY): Payer: Self-pay | Admitting: Obstetrics & Gynecology

## 2018-09-03 DIAGNOSIS — O9989 Other specified diseases and conditions complicating pregnancy, childbirth and the puerperium: Secondary | ICD-10-CM

## 2018-09-03 DIAGNOSIS — M329 Systemic lupus erythematosus, unspecified: Secondary | ICD-10-CM

## 2018-09-03 DIAGNOSIS — Z363 Encounter for antenatal screening for malformations: Secondary | ICD-10-CM

## 2018-09-03 DIAGNOSIS — Z3A17 17 weeks gestation of pregnancy: Secondary | ICD-10-CM

## 2018-09-03 DIAGNOSIS — O10912 Unspecified pre-existing hypertension complicating pregnancy, second trimester: Secondary | ICD-10-CM

## 2018-09-04 ENCOUNTER — Ambulatory Visit (HOSPITAL_BASED_OUTPATIENT_CLINIC_OR_DEPARTMENT_OTHER)
Admission: RE | Admit: 2018-09-04 | Discharge: 2018-09-04 | Disposition: A | Discharge status: Home / Self Care | Payer: 59 | Source: Ambulatory Visit | Attending: Obstetrics & Gynecology | Admitting: Obstetrics & Gynecology

## 2018-09-04 ENCOUNTER — Encounter (HOSPITAL_COMMUNITY): Payer: Self-pay

## 2018-09-04 ENCOUNTER — Ambulatory Visit (HOSPITAL_COMMUNITY)
Admission: RE | Admit: 2018-09-04 | Discharge: 2018-09-04 | Disposition: A | Discharge status: Home / Self Care | Payer: 59 | Source: Ambulatory Visit | Attending: Obstetrics & Gynecology | Admitting: Obstetrics & Gynecology

## 2018-09-04 ENCOUNTER — Other Ambulatory Visit (HOSPITAL_COMMUNITY): Payer: Self-pay | Admitting: *Deleted

## 2018-09-04 DIAGNOSIS — Z363 Encounter for antenatal screening for malformations: Secondary | ICD-10-CM | POA: Insufficient documentation

## 2018-09-04 DIAGNOSIS — O26892 Other specified pregnancy related conditions, second trimester: Secondary | ICD-10-CM

## 2018-09-04 DIAGNOSIS — O10012 Pre-existing essential hypertension complicating pregnancy, second trimester: Secondary | ICD-10-CM

## 2018-09-04 DIAGNOSIS — O99112 Other diseases of the blood and blood-forming organs and certain disorders involving the immune mechanism complicating pregnancy, second trimester: Secondary | ICD-10-CM | POA: Diagnosis not present

## 2018-09-04 DIAGNOSIS — M329 Systemic lupus erythematosus, unspecified: Secondary | ICD-10-CM | POA: Diagnosis not present

## 2018-09-04 DIAGNOSIS — O99212 Obesity complicating pregnancy, second trimester: Secondary | ICD-10-CM | POA: Diagnosis not present

## 2018-09-04 DIAGNOSIS — D6862 Lupus anticoagulant syndrome: Secondary | ICD-10-CM

## 2018-09-04 DIAGNOSIS — Z3A17 17 weeks gestation of pregnancy: Secondary | ICD-10-CM | POA: Insufficient documentation

## 2018-09-04 DIAGNOSIS — O10912 Unspecified pre-existing hypertension complicating pregnancy, second trimester: Secondary | ICD-10-CM | POA: Diagnosis not present

## 2018-09-04 DIAGNOSIS — O9989 Other specified diseases and conditions complicating pregnancy, childbirth and the puerperium: Secondary | ICD-10-CM | POA: Diagnosis not present

## 2018-09-04 DIAGNOSIS — O2692 Pregnancy related conditions, unspecified, second trimester: Secondary | ICD-10-CM | POA: Diagnosis not present

## 2018-09-04 DIAGNOSIS — O09292 Supervision of pregnancy with other poor reproductive or obstetric history, second trimester: Secondary | ICD-10-CM | POA: Diagnosis not present

## 2018-09-04 NOTE — Progress Notes (Signed)
FOLLOW UP CONSULT: DATE OF SERVICE: 09/04/18 REASON FOR CONSULT: LUPUS in pregnancy  [name] is here for a follow up (See consult in epic on 07/03/18) regarding Lupus in pregnancy. She is overall doing well but is currently managing a flare.  She notes that she has had a flare for about 3 weeks. Her symptoms consist of leg pain (Thigh and lower legs), numbness and tingling in hands and fatigue.  She is taking 400 mg daily of plaquenil and a tapered medrol pack.  Her other pregnancy related issues include: 1) Prior child with 1st degree heart block- fetal echo at [facility] scheduled 09/06/18 2) History of PE-on therapeutic lovenox 3) APLAS 4) Chronic hypertension- on labetalol 100mg  bid 5) Normal quad screen with two subsequent inadequate fetal fraction panorama results.  BP 135/95 pulse: 93  Fetal ultrasound today:   1)  Suboptimal fetal views secondary to maternal habitus and fetal position. 2) Normal fetal growth  Impression/Counseling:  2) Lupus/APLAS:  I reviewed the current care with Ms. Foster as stated above. She expressed a desire for a Rheumatologist second opinion. I discussed seen Dr. Dawayne Cirri at [facility] and coordinating fetal cardiac evaluation with Dr. Mayer Camel between these two providers. She was in agreement.   Secondly, encouraged her to continue her current medication regimen's but take 200 mg of plaquenil 2x per day vs once per day.  Thirdly, asked her primary OB's office to order baseline labs CMP, Urinary protein/creatinine ratio, Complement.  Next Steps 1) Fetal echo 2/14 2) Dr. Lucas Mallow appt week of the 16th- I discussed personally and information sent. 3) Follow up growth in 4 weeks.   I reviewed today's management and plan with her primary OB/GYN office Erin Sons, [name] who will be ordering the labs this week.   I spent 30- minute with >50% in face to face consultation and care coordination.  All questions answered.  Marico Buckle "Hayley Floro, MD

## 2018-09-06 ENCOUNTER — Encounter (HOSPITAL_COMMUNITY): Payer: Self-pay | Admitting: Obstetrics & Gynecology

## 2018-09-06 DIAGNOSIS — O26899 Other specified pregnancy related conditions, unspecified trimester: Secondary | ICD-10-CM | POA: Diagnosis not present

## 2018-09-06 DIAGNOSIS — M329 Systemic lupus erythematosus, unspecified: Secondary | ICD-10-CM | POA: Diagnosis not present

## 2018-09-09 ENCOUNTER — Other Ambulatory Visit (HOSPITAL_COMMUNITY): Payer: Self-pay | Admitting: Obstetrics and Gynecology

## 2018-09-10 ENCOUNTER — Inpatient Hospital Stay (HOSPITAL_COMMUNITY)
Admission: AD | Admit: 2018-09-10 | Discharge: 2018-09-10 | Disposition: A | Discharge status: Home / Self Care | Payer: 59 | Attending: Obstetrics and Gynecology | Admitting: Obstetrics and Gynecology

## 2018-09-10 ENCOUNTER — Encounter (HOSPITAL_COMMUNITY): Payer: Self-pay | Admitting: *Deleted

## 2018-09-10 DIAGNOSIS — Z86711 Personal history of pulmonary embolism: Secondary | ICD-10-CM | POA: Insufficient documentation

## 2018-09-10 DIAGNOSIS — Z3A18 18 weeks gestation of pregnancy: Secondary | ICD-10-CM | POA: Diagnosis not present

## 2018-09-10 DIAGNOSIS — M545 Low back pain: Secondary | ICD-10-CM | POA: Diagnosis not present

## 2018-09-10 DIAGNOSIS — N898 Other specified noninflammatory disorders of vagina: Secondary | ICD-10-CM | POA: Diagnosis not present

## 2018-09-10 DIAGNOSIS — R109 Unspecified abdominal pain: Secondary | ICD-10-CM | POA: Diagnosis not present

## 2018-09-10 DIAGNOSIS — M329 Systemic lupus erythematosus, unspecified: Secondary | ICD-10-CM

## 2018-09-10 DIAGNOSIS — Z3A17 17 weeks gestation of pregnancy: Secondary | ICD-10-CM | POA: Diagnosis not present

## 2018-09-10 DIAGNOSIS — O10012 Pre-existing essential hypertension complicating pregnancy, second trimester: Secondary | ICD-10-CM | POA: Diagnosis not present

## 2018-09-10 LAB — COMPREHENSIVE METABOLIC PANEL
ALT: 16 U/L (ref 0–44)
AST: 16 U/L (ref 15–41)
Albumin: 3.4 g/dL — ABNORMAL LOW (ref 3.5–5.0)
Alkaline Phosphatase: 60 U/L (ref 38–126)
Anion gap: 10 (ref 5–15)
BILIRUBIN TOTAL: 0.3 mg/dL (ref 0.3–1.2)
BUN: 5 mg/dL — ABNORMAL LOW (ref 6–20)
CALCIUM: 9.2 mg/dL (ref 8.9–10.3)
CO2: 18 mmol/L — ABNORMAL LOW (ref 22–32)
CREATININE: 0.57 mg/dL (ref 0.44–1.00)
Chloride: 104 mmol/L (ref 98–111)
GFR calc Af Amer: >60 mL/min (ref >60)
GFR calc non Af Amer: >60 mL/min (ref >60)
Glucose, Bld: 102 mg/dL — ABNORMAL HIGH (ref 70–99)
Potassium: 3.3 mmol/L — ABNORMAL LOW (ref 3.5–5.1)
Sodium: 132 mmol/L — ABNORMAL LOW (ref 135–145)
Total Protein: 8.2 g/dL — ABNORMAL HIGH (ref 6.5–8.1)

## 2018-09-10 LAB — WET PREP, GENITAL
Clue Cells Wet Prep HPF POC: NONE SEEN
Sperm: NONE SEEN
Trich, Wet Prep: NONE SEEN
YEAST WET PREP: NONE SEEN

## 2018-09-10 LAB — URINALYSIS, ROUTINE W REFLEX MICROSCOPIC
Bilirubin Urine: NEGATIVE
Glucose, UA: NEGATIVE mg/dL
Ketones, ur: NEGATIVE mg/dL
LEUKOCYTE UA: NEGATIVE
NITRITE: NEGATIVE
Protein, ur: NEGATIVE mg/dL
Specific Gravity, Urine: <1.005 — ABNORMAL LOW (ref 1.005–1.030)
pH: 6 (ref 5.0–8.0)

## 2018-09-10 LAB — PROTEIN / CREATININE RATIO, URINE: Creatinine, Urine: 110 mg/dL

## 2018-09-10 LAB — CBC WITH DIFFERENTIAL/PLATELET
Basophils Absolute: 0 10*3/uL (ref 0.0–0.1)
Basophils Relative: 0 %
Eosinophils Absolute: 0 10*3/uL (ref 0.0–0.5)
Eosinophils Relative: 1 %
HCT: 35.3 % — ABNORMAL LOW (ref 36.0–46.0)
Hemoglobin: 12 g/dL (ref 12.0–15.0)
LYMPHS PCT: 27 %
Lymphs Abs: 1.8 10*3/uL (ref 0.7–4.0)
MCH: 28.5 pg (ref 26.0–34.0)
MCHC: 34 g/dL (ref 30.0–36.0)
MCV: 83.8 fL (ref 80.0–100.0)
Monocytes Absolute: 0.3 10*3/uL (ref 0.1–1.0)
Monocytes Relative: 4 %
Neutro Abs: 4.5 10*3/uL (ref 1.7–7.7)
Neutrophils Relative %: 68 %
Platelets: 311 10*3/uL (ref 150–400)
RBC: 4.21 MIL/uL (ref 3.87–5.11)
RDW: 14.3 % (ref 11.5–15.5)
WBC: 6.6 10*3/uL (ref 4.0–10.5)
nRBC: 0 % (ref 0.0–0.2)

## 2018-09-10 LAB — URINALYSIS, MICROSCOPIC (REFLEX)

## 2018-09-10 MED ORDER — LABETALOL HCL 200 MG PO TABS: 400.0000 mg | ORAL_TABLET | Freq: Two times a day (BID) | ORAL | 5 refills | Status: DC

## 2018-09-10 NOTE — MAU Note (Signed)
Pt reports abd cramping and pressure this am, as well as lower back cramping. Also reports a yellow discharge.

## 2018-09-10 NOTE — MAU Provider Note (Addendum)
History     CSN: [id number]  Arrival date and time: 09/10/18 1103   First Provider Initiated Contact with Patient 09/10/18 1139      Chief Complaint  Patient presents with  . Abdominal Pain  . Back Pain  . Pelvic Pain  . Vaginal Discharge   Patient reports abdominal cramping, low back pain, and pressure starting this morning. She reports seeing a thick yellowish discharge and feeling vaginal pruritis. She reports a recurrent yeast infection which she has been treated for. Her cramping is mild to moderate and occurs about every 30-34min lasting about 10 minutes each time. She denies vaginal bleeding or loss of fluid and has been feeling good fetal movement. She denies symptoms of preeclampsia: headache, visual changes, RUQ pain. She denies symptoms of UTI. She reports she has been stressed recently and think she could be having a lupus flair.    OB History    Gravida  3   Para  1   Term  1   Preterm      AB  1   Living  1     SAB  1   TAB      Ectopic      Multiple      Live Births  1           Past Medical History:  Diagnosis Date  . Anxiety    doing good now  . Asthma   . Headache(784.0)   . HSV infection   . Hypertension   . Infection    UTI  . Lupus (HCC)    dx age 41  . Pulmonary embolism (HCC) 06/4/82011  . 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 ADENOIDECTOMY    . TONSILLECTOMY AND ADENOIDECTOMY  1998  . WISDOM TOOTH EXTRACTION      Family History  Problem Relation Age of Onset  . Diabetes Maternal Aunt   . Cancer Maternal Grandmother 72       COLON CA  . Diabetes Maternal Grandmother   . Hypertension Maternal Grandfather   . Cancer Maternal Grandfather        prostate  . Asthma Mother   . Asthma Brother   . Cancer Paternal Grandmother        breast  . Lupus Paternal Grandmother   . Lupus Paternal Aunt   . Stroke Paternal Aunt   . Anesthesia problems  Neg Hx   . Hypotension Neg Hx   . Malignant hyperthermia Neg Hx   . Pseudochol deficiency Neg Hx     Social History   Tobacco Use  . Smoking status: Never Smoker  . Smokeless tobacco: Never Used  Substance Use Topics  . Alcohol use: No  . Drug use: No    Allergies:  Allergies  Allergen Reactions  . Bactrim Anaphylaxis and Hives  . Hydrocodone-Acetaminophen Anaphylaxis  . Nifedipine Swelling and Anaphylaxis    Swelling of tongue  . Pineapple Itching  . Prednisone Anaphylaxis and Hives    Has been on Dexamethasone without issue.  . Shellfish Allergy Itching  . Sulfamethoxazole-Trimethoprim Other (See Comments)    Complications due to lupus  . Nsaids Other (See Comments)    Has Lupus, reccommended to avoid NSAIDs  . Other Itching    Shrimp:throat itching "can eat other shellfish"  . Vicodin [Hydrocodone-Acetaminophen] Hives and Swelling    Can take Percocet without difficulty    Medications Prior  to Admission  Medication Sig Dispense Refill Last Dose  . albuterol (PROVENTIL HFA;VENTOLIN HFA) 108 (90 Base) MCG/ACT inhaler Inhale 2 puffs into the lungs every 6 (six) hours as needed for wheezing or shortness of breath.   Taking  . albuterol (PROVENTIL) (2.5 MG/3ML) 0.083% nebulizer solution Take 2.5 mg by nebulization every 6 (six) hours as needed for wheezing or shortness of breath.   Taking  . aspirin EC 81 MG tablet Take 1 tablet (81 mg total) by mouth daily. (Patient not taking: Reported on 09/04/2018) 90 tablet 3 Not Taking  . cholecalciferol (VITAMIN D) 1000 units tablet Take 1,000 Units by mouth daily.   Taking  . Doxylamine-Pyridoxine ER (BONJESTA) 20-20 MG TBCR Bonjesta 20 mg-20 mg tablet,immediate and delay release  Take 1 tablet twice a day by oral route.   Not Taking  . enoxaparin (LOVENOX) 40 MG/0.4ML injection Inject 1.5 mLs (150 mg total) into the skin every 12 (twelve) hours.   Taking  . hydroxychloroquine (PLAQUENIL) 200 MG tablet Take 100 mg by mouth 2 (two)  times daily.   Taking  . labetalol (NORMODYNE) 200 MG tablet labetalol 200 mg tablet  TAKE 1 TABLET BY MOUTH TWICE DAILY   Taking  . metoCLOPramide (REGLAN) 10 MG tablet Take 1 tablet (10 mg total) by mouth every 6 (six) hours. 30 tablet 0 Taking  . ondansetron (ZOFRAN) 4 MG tablet Take 8 mg by mouth 2 (two) times daily.   Taking  . Prenatal Vit-Fe Fumarate-FA (MULTIVITAMIN-PRENATAL) 27-0.8 MG TABS tablet Take 1 tablet by mouth daily at 12 noon.   Taking  . promethazine (PHENERGAN) 25 MG tablet Take 0.5-1 tablets (12.5-25 mg total) by mouth every 6 (six) hours as needed. 30 tablet 0 Taking  . terconazole (TERAZOL 7) 0.4 % vaginal cream terconazole 0.4 % vaginal cream  Insert 1 applicatorful every day by vaginal route for 7 days.   Not Taking  . traMADol (ULTRAM) 50 MG tablet Take 1 tablet (50 mg total) by mouth every 6 (six) hours as needed. (Patient not taking: Reported on 09/04/2018) 21 tablet 0 Not Taking    Review of Systems  Eyes: Negative for visual disturbance.  Gastrointestinal: Positive for abdominal pain. Negative for constipation, diarrhea, nausea and vomiting.  Genitourinary: Positive for vaginal discharge. Negative for dysuria, flank pain, hematuria, vaginal bleeding and vaginal pain.  Musculoskeletal: Positive for back pain.  All other systems reviewed and are negative.  Physical Exam   Vitals:   09/10/18 1113 09/10/18 1250 09/10/18 1301  BP: (!) 158/89 (!) 142/70 (!) 138/92  Pulse: (!) 115 95 (!) 112  Resp: 18    Temp: 99 F (37.2 C)    TempSrc: Oral    SpO2: 98%    Weight: (!) 146.5 kg    Height: 5' 8.5" (1.74 m)     Results for orders placed or performed during the hospital encounter of 09/10/18 (from the past 24 hour(s))  CBC with Differential/Platelet     Status: Abnormal   Collection Time: 09/10/18 11:16 AM  Result Value Ref Range   WBC 6.6 4.0 - 10.5 K/uL   RBC 4.21 3.87 - 5.11 MIL/uL   Hemoglobin 12.0 12.0 - 15.0 g/dL   HCT 36.6 (L) 29.4 - 76.5 %   MCV  83.8 80.0 - 100.0 fL   MCH 28.5 26.0 - 34.0 pg   MCHC 34.0 30.0 - 36.0 g/dL   RDW 46.5 03.5 - 46.5 %   Platelets 311 150 - 400 K/uL  nRBC 0.0 0.0 - 0.2 %   Neutrophils Relative % 68 %   Neutro Abs 4.5 1.7 - 7.7 K/uL   Lymphocytes Relative 27 %   Lymphs Abs 1.8 0.7 - 4.0 K/uL   Monocytes Relative 4 %   Monocytes Absolute 0.3 0.1 - 1.0 K/uL   Eosinophils Relative 1 %   Eosinophils Absolute 0.0 0.0 - 0.5 K/uL   Basophils Relative 0 %   Basophils Absolute 0.0 0.0 - 0.1 K/uL  Comprehensive metabolic panel     Status: Abnormal   Collection Time: 09/10/18 11:16 AM  Result Value Ref Range   Sodium 132 (L) 135 - 145 mmol/L   Potassium 3.3 (L) 3.5 - 5.1 mmol/L   Chloride 104 98 - 111 mmol/L   CO2 18 (L) 22 - 32 mmol/L   Glucose, Bld 102 (H) 70 - 99 mg/dL   BUN 5 (L) 6 - 20 mg/dL   Creatinine, Ser 1.610.57 0.44 - 1.00 mg/dL   Calcium 9.2 8.9 - 09.610.3 mg/dL   Total Protein 8.2 (H) 6.5 - 8.1 g/dL   Albumin 3.4 (L) 3.5 - 5.0 g/dL   AST 16 15 - 41 U/L   ALT 16 0 - 44 U/L   Alkaline Phosphatase 60 38 - 126 U/L   Total Bilirubin 0.3 0.3 - 1.2 mg/dL   GFR calc non Af Amer >60 >60 mL/min   GFR calc Af Amer >60 >60 mL/min   Anion gap 10 5 - 15  Wet prep, genital     Status: Abnormal   Collection Time: 09/10/18 11:43 AM  Result Value Ref Range   Yeast Wet Prep HPF POC NONE SEEN NONE SEEN   Trich, Wet Prep NONE SEEN NONE SEEN   Clue Cells Wet Prep HPF POC NONE SEEN NONE SEEN   WBC, Wet Prep HPF POC FEW (A) NONE SEEN   Sperm NONE SEEN   Urinalysis, Routine w reflex microscopic     Status: Abnormal   Collection Time: 09/10/18 12:06 PM  Result Value Ref Range   Color, Urine YELLOW YELLOW   APPearance CLEAR CLEAR   Specific Gravity, Urine <1.005 (L) 1.005 - 1.030   pH 6.0 5.0 - 8.0   Glucose, UA NEGATIVE NEGATIVE mg/dL   Hgb urine dipstick SMALL (A) NEGATIVE   Bilirubin Urine NEGATIVE NEGATIVE   Ketones, ur NEGATIVE NEGATIVE mg/dL   Protein, ur NEGATIVE NEGATIVE mg/dL   Nitrite NEGATIVE  NEGATIVE   Leukocytes,Ua NEGATIVE NEGATIVE  Protein / creatinine ratio, urine     Status: None   Collection Time: 09/10/18 12:06 PM  Result Value Ref Range   Creatinine, Urine 110.00 mg/dL   Total Protein, Urine <6 mg/dL   Protein Creatinine Ratio        0.00 - 0.15 mg/mg[Cre]  Urinalysis, Microscopic (reflex)     Status: Abnormal   Collection Time: 09/10/18 12:06 PM  Result Value Ref Range   RBC / HPF 0-5 0 - 5 RBC/hpf   WBC, UA 0-5 0 - 5 WBC/hpf   Bacteria, UA RARE (A) NONE SEEN   Squamous Epithelial / LPF 0-5 0 - 5    Physical Exam  Nursing note and vitals reviewed. Constitutional: She is oriented to person, place, and time. She appears well-developed and well-nourished.  HENT:  Head: Normocephalic and atraumatic.  Right Ear: External ear normal.  Eyes: Pupils are equal, round, and reactive to light.  Cardiovascular: Normal rate, regular rhythm and normal heart sounds.  Respiratory: Effort normal and breath  sounds normal. No respiratory distress.  GI: Soft. Bowel sounds are normal. She exhibits no mass. There is no abdominal tenderness. There is no guarding.  Genitourinary:    Vagina and uterus normal.     No vaginal discharge.   Musculoskeletal: Normal range of motion.  Neurological: She is alert and oriented to person, place, and time.  Skin: Skin is warm and dry.  Psychiatric: She has a normal mood and affect. Her behavior is normal. Judgment and thought content normal.   Dilation: Fingertip Effacement (%): Thick Exam by:: Ethelene BrownsE. Marinus Eicher, [name]  MAU Course  Procedures PIH labs GC probe Wet Prep Urinalysis  MDM Patient's symptoms not consistent with preterm labor: cervix is long and fingertip, no vaginal bleeding, cramping and back pain not consistent with contractions. Wet prep and urinalysis are negative. PIH labs are unremarkable. GC probe results pending, will f/u from office. Consulted Dr. Normand Sloopillard regarding patient complaint and reviewed lab results from this visit  and from her most recent office visit. Per Dr. Normand Sloopillard, patient's symptoms consistent with Lupus flair. Plan to increase Labetalol to 400mg  BID and see patient at the end of this week or early next week for office f/u visit. Preterm labor and preeclampsia precautions discussed with patient and handout given.   Assessment and Plan  33 y.o. G3P1 at 3876w2d Chronic hypertension on labetalol Lupus on steroids and plaquenil for management, percocet PRN pain management Increase Labetalol to 400mg  BID Preterm labor and preeclampsia precautions discussed and handout given Patient discharged in stable condition, to f/u in office at the end of this week or next week  Janeece Riggersllis K Kamare Caspers 09/10/2018, 1:06 PM

## 2018-09-10 NOTE — Discharge Instructions (Signed)
Systemic Lupus Erythematosus, Adult Systemic lupus erythematosus (SLE) is a long-term (chronic) disease that can affect many parts of the body. SLE is an autoimmune disease. With this type of disease, the body's defense system (immune system) mistakenly attacks healthy tissues. This can cause damage to the skin, joints, blood vessels, brain, kidneys, lungs, heart, and other internal organs. It causes pain, irritation, and inflammation. What are the causes? The cause of this condition is not known. What increases the risk? The following factors may make you more likely to develop this condition:  Being female.  Being of Asian, Hispanic, or African-American descent.  Having a family history of the condition.  Being exposed to tobacco smoke or smoking cigarettes.  Having an infection with a virus, such as Epstein-Barr virus.  Having a history of exposure to silica dust, metals, chemicals, mold or mildew, or insecticides.  Using oral contraceptives or hormone replacement therapy. What are the signs or symptoms? This condition can affect almost any organ or system in the body. Symptoms of the condition depend on which organ or system is affected. The most common symptoms include:  Fever.  Fatigue.  Weight loss.  Muscle aches.  Joint pain.  Skin rashes, especially over the nose and cheeks (butterfly rash) and after sun exposure. Symptoms can come and go. A period of time when symptoms get worse or come back is called a flare. A period of time with no symptoms is called a remission. How is this diagnosed? This condition is diagnosed based on:  Your symptoms.  Your medical history.  A physical exam. You may also have tests, including:  Blood tests.  Urine tests.  A chest X-ray. You may be referred to an autoimmune disease specialist (rheumatologist). How is this treated? There is no cure for this condition, but treatment can help to control symptoms, prevent flares (keep  symptoms in remission), and prevent damage to the heart, lungs, kidneys, and other organs. Treatment will depend on what symptoms you are having and what organs or systems are affected. Treatment may involve taking a combination of medicines over time. Common medicines used to treat this condition include:  Antimalarial medicines to control symptoms, prevent flares, and protect against organ damage.  Corticosteroids and NSAIDs to reduce inflammation.  Medicines to weaken your immune system (immunosuppressants).  Biologic response modifiers to reduce inflammation and damage. Follow these instructions at home: Eating and drinking  Eat a heart-healthy diet. This may include: ? Eating high-fiber foods, such as fresh fruits and vegetables, whole grains, and beans. ? Eating heart-healthy fats (omega-3 fats), such as fish, flaxseed, and flaxseed oil. ? Limiting foods that are high in saturated fat and cholesterol, such as processed and fried foods, fatty meat, and full-fat dairy. ? Limiting how much salt (sodium) you eat.  Include calcium and vitamin D in your diet. Good sources of calcium and vitamin D include: ? Low-fat dairy products such as milk, yogurt, and cheese. ? Certain fish, such as fresh or canned salmon, tuna, and sardines. ? Products that have calcium and vitamin D added to them (fortified products), such as fortified cereals or juice. Medicines  Take over-the-counter and prescription medicines only as told by your health care provider.  Do not take any medicines that contain estrogen without first checking with your health care provider. Estrogen can trigger flares and may increase your risk for blood clots. Lifestyle      Stay active, as directed by your health care provider.  Do not use any products   that contain nicotine or tobacco, such as cigarettes and e-cigarettes. If you need help quitting, ask your health care provider.  Protect your skin from the sun by applying  sunblock and wearing protective hats and clothing.  Learn as much as you can about your condition and have a good support system in place. Support may come from family, friends, or a lupus support group. General instructions  Work closely with all of your health care providers to manage your condition.  Stay up to date on all vaccines as directed by your health care provider.  Keep all follow-up visits as told by your health care provider. This is important. Contact a health care provider if:  You have a fever.  Your symptoms flare.  You develop new symptoms.  You have bloody, foamy, or coffee-colored urine.  There are changes in your urination. For example, you urinate more often at night.  You think that you may be depressed or have anxiety.  You become pregnant or plan to become pregnant. Pregnancy in women with this condition is considered high risk. Get help right away if:  You have chest pain.  You have trouble breathing.  You have a seizure.  You suddenly get a very bad headache.  You suddenly develop facial or body weakness.  You cannot speak.  You cannot understand speech. These symptoms may represent a serious problem that is an emergency. Do not wait to see if the symptoms will go away. Get medical help right away. Call your local emergency services (911 in the [place] Do not drive yourself to the hospital. Summary  Systemic lupus erythematosus (SLE) is a long-term disease that can affect many parts of the body.  SLE is an autoimmune disease. That means your body's defense system (immune system) mistakenly attacks healthy tissues.  There is no cure for this condition, but treatment can help to control symptoms, prevent flares, and prevent damage to your organs. Treatment may involve taking a combination of medicines over time. This information is not intended to replace advice given to you by your health care provider. Make sure you discuss any questions you  have with your health care provider. Document Released: 06/30/2002 Document Revised: 08/17/2017 Document Reviewed: 08/17/2017 Elsevier Interactive Patient Education  2019 [employer].   Preterm Labor and Birth Information Pregnancy normally lasts 39-41 weeks. Preterm labor is when labor starts early. It starts before you have been pregnant for 37 whole weeks. What are the risk factors for preterm labor? Preterm labor is more likely to occur in women who:  Have an infection while pregnant.  Have a cervix that is short.  Have gone into preterm labor before.  Have had surgery on their cervix.  Are younger than age 60.  Are older than age 26.  Are African American.  Are pregnant with two or more babies.  Take street drugs while pregnant.  Smoke while pregnant.  Do not gain enough weight while pregnant.  Got pregnant right after another pregnancy. What are the symptoms of preterm labor? Symptoms of preterm labor include:  Cramps. The cramps may feel like the cramps some women get during their period. The cramps may happen with watery poop (diarrhea).  Pain in the belly (abdomen).  Pain in the lower back.  Regular contractions or tightening. It may feel like your belly is getting tighter.  Pressure in the lower belly that seems to get stronger.  More fluid (discharge) leaking from the vagina. The fluid may be watery or bloody.  Water breaking. Why is it important to notice signs of preterm labor? Babies who are born early may not be fully developed. They have a higher chance for:  Long-term heart problems.  Long-term lung problems.  Trouble controlling body systems, like breathing.  Bleeding in the brain.  A condition called cerebral palsy.  Learning difficulties.  Death. These risks are highest for babies who are born before 34 weeks of pregnancy. How is preterm labor treated? Treatment depends on:  How long you were pregnant.  Your  condition.  The health of your baby. Treatment may involve:  Having a stitch (suture) placed in your cervix. When you give birth, your cervix opens so the baby can come out. The stitch keeps the cervix from opening too soon.  Staying at the hospital.  Taking or getting medicines, such as: ? Hormone medicines. ? Medicines to stop contractions. ? Medicines to help the babys lungs develop. ? Medicines to prevent your baby from having cerebral palsy. What should I do if I am in preterm labor? If you think you are going into labor too soon, call your doctor right away. How can I prevent preterm labor?  Do not use any tobacco products. ? Examples of these are cigarettes, chewing tobacco, and e-cigarettes. ? If you need help quitting, ask your doctor.  Do not use street drugs.  Do not use any medicines unless you ask your doctor if they are safe for you.  Talk with your doctor before taking any herbal supplements.  Make sure you gain enough weight.  Watch for infection. If you think you might have an infection, get it checked right away.  If you have gone into preterm labor before, tell your doctor. This information is not intended to replace advice given to you by your health care provider. Make sure you discuss any questions you have with your health care provider. Document Released: 10/06/2008 Document Revised: 12/21/2015 Document Reviewed: 12/01/2015 Elsevier Interactive Patient Education  2019 [employer]  Preeclampsia and Eclampsia  Preeclampsia is a serious condition that may develop during pregnancy. It is also called toxemia of pregnancy. This condition causes high blood pressure along with other symptoms, such as swelling and headaches. These symptoms may develop as the condition gets worse. Preeclampsia may occur at 20 weeks of pregnancy or later. Diagnosing and treating preeclampsia early is very important. If not treated early, it can cause serious problems for you  and your baby. One problem it can lead to is eclampsia. Eclampsia is a condition that causes muscle jerking or shaking (convulsions or seizures) and other serious problems for the mother. During pregnancy, delivering your baby may be the best treatment for preeclampsia or eclampsia. For most women, preeclampsia and eclampsia symptoms go away after giving birth. In rare cases, a woman may develop preeclampsia after giving birth (postpartum preeclampsia). This usually occurs within 48 hours after childbirth but may occur up to 6 weeks after giving birth. What are the causes? The cause of preeclampsia is not known. What increases the risk? The following risk factors make you more likely to develop preeclampsia:  Being pregnant for the first time.  Having had preeclampsia during a past pregnancy.  Having a family history of preeclampsia.  Having high blood pressure.  Being pregnant with more than one baby.  Being 46 or older.  Being African-American.  Having kidney disease or diabetes.  Having medical conditions such as lupus or blood diseases.  Being very overweight (obese). What are the signs or  symptoms? The earliest signs of preeclampsia are:  High blood pressure.  Increased protein in your urine. Your health care provider will check for this at every visit before you give birth (prenatal visit). Other symptoms that may develop as the condition gets worse include:  Severe headaches.  Sudden weight gain.  Swelling of the hands, face, legs, and feet.  Nausea and vomiting.  Vision problems, such as blurred or double vision.  Numbness in the face, arms, legs, and feet.  Urinating less than usual.  Dizziness.  Slurred speech.  Abdominal pain, especially upper abdominal pain.  Convulsions or seizures. How is this diagnosed? There are no screening tests for preeclampsia. Your health care provider will ask you about symptoms and check for signs of preeclampsia during  your prenatal visits. You may also have tests that include:  Urine tests.  Blood tests.  Checking your blood pressure.  Monitoring your babys heart rate.  Ultrasound. How is this treated? You and your health care provider will determine the treatment approach that is best for you. Treatment may include:  Having more frequent prenatal exams to check for signs of preeclampsia, if you have an increased risk for preeclampsia.  Medicine to lower your blood pressure.  Staying in the hospital, if your condition is severe. There, treatment will focus on controlling your blood pressure and the amount of fluids in your body (fluid retention).  Taking medicine (magnesium sulfate) to prevent seizures. This may be given as an injection or through an IV.  Taking a low-dose aspirin during your pregnancy.  Delivering your baby early, if your condition gets worse. You may have your labor started with medicine (induced), or you may have a cesarean delivery. Follow these instructions at home: Eating and drinking   Drink enough fluid to keep your urine pale yellow.  Avoid caffeine. Lifestyle  Do not use any products that contain nicotine or tobacco, such as cigarettes and e-cigarettes. If you need help quitting, ask your health care provider.  Do not use alcohol or drugs.  Avoid stress as much as possible. Rest and get plenty of sleep. General instructions  Take over-the-counter and prescription medicines only as told by your health care provider.  When lying down, lie on your left side. This keeps pressure off your major blood vessels.  When sitting or lying down, raise (elevate) your feet. Try putting some pillows underneath your lower legs.  Exercise regularly. Ask your health care provider what kinds of exercise are best for you.  Keep all follow-up and prenatal visits as told by your health care provider. This is important. How is this prevented? There is no known way of  preventing preeclampsia or eclampsia from developing. However, to lower your risk of complications and detect problems early:  Get regular prenatal care. Your health care provider may be able to diagnose and treat the condition early.  Maintain a healthy weight. Ask your health care provider for help managing weight gain during pregnancy.  Work with your health care provider to manage any long-term (chronic) health conditions you have, such as diabetes or kidney problems.  You may have tests of your blood pressure and kidney function after giving birth.  Your health care provider may have you take low-dose aspirin during your next pregnancy. Contact a health care provider if:  You have symptoms that your health care provider told you may require more treatment or monitoring, such as: ? Headaches. ? Nausea or vomiting. ? Abdominal pain. ? Dizziness. ? Light-headedness.  Get help right away if:  You have severe: ? Abdominal pain. ? Headaches that do not get better. ? Dizziness. ? Vision problems. ? Confusion. ? Nausea or vomiting.  You have any of the following: ? A seizure. ? Sudden, rapid weight gain. ? Sudden swelling in your hands, ankles, or face. ? Trouble moving any part of your body. ? Numbness in any part of your body. ? Trouble speaking. ? Abnormal bleeding.  You faint. Summary  Preeclampsia is a serious condition that may develop during pregnancy. It is also called toxemia of pregnancy.  This condition causes high blood pressure along with other symptoms, such as swelling and headaches.  Diagnosing and treating preeclampsia early is very important. If not treated early, it can cause serious problems for you and your baby.  Get help right away if you have symptoms that your health care provider told you to watch for. This information is not intended to replace advice given to you by your health care provider. Make sure you discuss any questions you have with  your health care provider. Document Released: 07/07/2000 Document Revised: 06/26/2017 Document Reviewed: 02/14/2016 Elsevier Interactive Patient Education  2019 [employer]

## 2018-09-11 LAB — GC/CHLAMYDIA PROBE AMP (~~LOC~~) NOT AT ARMC
Chlamydia: NEGATIVE
Neisseria Gonorrhea: NEGATIVE

## 2018-09-13 ENCOUNTER — Encounter (HOSPITAL_COMMUNITY): Payer: Self-pay | Admitting: Obstetrics and Gynecology

## 2018-09-13 DIAGNOSIS — O26899 Other specified pregnancy related conditions, unspecified trimester: Secondary | ICD-10-CM | POA: Diagnosis not present

## 2018-09-13 DIAGNOSIS — M329 Systemic lupus erythematosus, unspecified: Secondary | ICD-10-CM | POA: Diagnosis not present

## 2018-09-16 DIAGNOSIS — M329 Systemic lupus erythematosus, unspecified: Secondary | ICD-10-CM | POA: Diagnosis not present

## 2018-09-16 DIAGNOSIS — Z86711 Personal history of pulmonary embolism: Secondary | ICD-10-CM | POA: Diagnosis not present

## 2018-09-16 DIAGNOSIS — Z331 Pregnant state, incidental: Secondary | ICD-10-CM | POA: Diagnosis not present

## 2018-09-16 DIAGNOSIS — M791 Myalgia, unspecified site: Secondary | ICD-10-CM | POA: Diagnosis not present

## 2018-09-20 DIAGNOSIS — R768 Other specified abnormal immunological findings in serum: Secondary | ICD-10-CM | POA: Diagnosis not present

## 2018-09-20 DIAGNOSIS — O99112 Other diseases of the blood and blood-forming organs and certain disorders involving the immune mechanism complicating pregnancy, second trimester: Secondary | ICD-10-CM | POA: Diagnosis not present

## 2018-09-20 DIAGNOSIS — Z3A19 19 weeks gestation of pregnancy: Secondary | ICD-10-CM | POA: Diagnosis not present

## 2018-09-20 DIAGNOSIS — D6862 Lupus anticoagulant syndrome: Secondary | ICD-10-CM | POA: Diagnosis not present

## 2018-09-27 DIAGNOSIS — O9989 Other specified diseases and conditions complicating pregnancy, childbirth and the puerperium: Secondary | ICD-10-CM | POA: Diagnosis not present

## 2018-09-27 DIAGNOSIS — Z3A20 20 weeks gestation of pregnancy: Secondary | ICD-10-CM | POA: Diagnosis not present

## 2018-09-27 DIAGNOSIS — M329 Systemic lupus erythematosus, unspecified: Secondary | ICD-10-CM | POA: Diagnosis not present

## 2018-09-27 DIAGNOSIS — O99352 Diseases of the nervous system complicating pregnancy, second trimester: Secondary | ICD-10-CM | POA: Diagnosis not present

## 2018-10-02 ENCOUNTER — Encounter (HOSPITAL_COMMUNITY): Payer: Self-pay

## 2018-10-02 ENCOUNTER — Other Ambulatory Visit (HOSPITAL_COMMUNITY): Payer: Self-pay | Admitting: *Deleted

## 2018-10-02 ENCOUNTER — Ambulatory Visit (HOSPITAL_COMMUNITY): Payer: 59 | Admitting: *Deleted

## 2018-10-02 ENCOUNTER — Ambulatory Visit (HOSPITAL_COMMUNITY)
Admission: RE | Admit: 2018-10-02 | Discharge: 2018-10-02 | Disposition: A | Discharge status: Home / Self Care | Payer: 59 | Source: Ambulatory Visit | Attending: Obstetrics & Gynecology | Admitting: Obstetrics & Gynecology

## 2018-10-02 ENCOUNTER — Other Ambulatory Visit: Payer: Self-pay

## 2018-10-02 VITALS — BP 137/85 | HR 103 | Wt 327.2 lb

## 2018-10-02 DIAGNOSIS — O10012 Pre-existing essential hypertension complicating pregnancy, second trimester: Secondary | ICD-10-CM

## 2018-10-02 DIAGNOSIS — O0990 Supervision of high risk pregnancy, unspecified, unspecified trimester: Secondary | ICD-10-CM

## 2018-10-02 DIAGNOSIS — O26892 Other specified pregnancy related conditions, second trimester: Secondary | ICD-10-CM | POA: Diagnosis not present

## 2018-10-02 DIAGNOSIS — O09292 Supervision of pregnancy with other poor reproductive or obstetric history, second trimester: Secondary | ICD-10-CM

## 2018-10-02 DIAGNOSIS — Z3492 Encounter for supervision of normal pregnancy, unspecified, second trimester: Secondary | ICD-10-CM | POA: Diagnosis not present

## 2018-10-02 DIAGNOSIS — Z3A21 21 weeks gestation of pregnancy: Secondary | ICD-10-CM

## 2018-10-02 DIAGNOSIS — D6862 Lupus anticoagulant syndrome: Secondary | ICD-10-CM | POA: Diagnosis present

## 2018-10-02 DIAGNOSIS — M329 Systemic lupus erythematosus, unspecified: Secondary | ICD-10-CM | POA: Diagnosis not present

## 2018-10-02 DIAGNOSIS — O99212 Obesity complicating pregnancy, second trimester: Secondary | ICD-10-CM

## 2018-10-02 DIAGNOSIS — Z362 Encounter for other antenatal screening follow-up: Secondary | ICD-10-CM | POA: Diagnosis not present

## 2018-10-02 DIAGNOSIS — O2692 Pregnancy related conditions, unspecified, second trimester: Secondary | ICD-10-CM | POA: Diagnosis not present

## 2018-10-02 DIAGNOSIS — O99112 Other diseases of the blood and blood-forming organs and certain disorders involving the immune mechanism complicating pregnancy, second trimester: Secondary | ICD-10-CM | POA: Insufficient documentation

## 2018-10-02 DIAGNOSIS — N898 Other specified noninflammatory disorders of vagina: Secondary | ICD-10-CM | POA: Diagnosis not present

## 2018-10-02 DIAGNOSIS — Z6841 Body Mass Index (BMI) 40.0 and over, adult: Secondary | ICD-10-CM

## 2018-10-02 DIAGNOSIS — O0992 Supervision of high risk pregnancy, unspecified, second trimester: Secondary | ICD-10-CM | POA: Diagnosis not present

## 2018-10-02 NOTE — Progress Notes (Unsigned)
Us/

## 2018-10-04 DIAGNOSIS — O9989 Other specified diseases and conditions complicating pregnancy, childbirth and the puerperium: Secondary | ICD-10-CM | POA: Diagnosis not present

## 2018-10-04 DIAGNOSIS — M329 Systemic lupus erythematosus, unspecified: Secondary | ICD-10-CM | POA: Diagnosis not present

## 2018-10-09 ENCOUNTER — Ambulatory Visit (HOSPITAL_COMMUNITY): Payer: 59

## 2018-10-13 ENCOUNTER — Encounter (HOSPITAL_COMMUNITY): Payer: Self-pay | Admitting: *Deleted

## 2018-10-13 ENCOUNTER — Other Ambulatory Visit: Payer: Self-pay

## 2018-10-13 ENCOUNTER — Inpatient Hospital Stay (HOSPITAL_COMMUNITY)
Admission: AD | Admit: 2018-10-13 | Discharge: 2018-10-13 | Disposition: A | Discharge status: Home / Self Care | Payer: 59 | Attending: Obstetrics and Gynecology | Admitting: Obstetrics and Gynecology

## 2018-10-13 DIAGNOSIS — O26892 Other specified pregnancy related conditions, second trimester: Secondary | ICD-10-CM | POA: Diagnosis not present

## 2018-10-13 DIAGNOSIS — O162 Unspecified maternal hypertension, second trimester: Secondary | ICD-10-CM | POA: Diagnosis not present

## 2018-10-13 DIAGNOSIS — O479 False labor, unspecified: Secondary | ICD-10-CM | POA: Diagnosis not present

## 2018-10-13 DIAGNOSIS — O99512 Diseases of the respiratory system complicating pregnancy, second trimester: Secondary | ICD-10-CM | POA: Insufficient documentation

## 2018-10-13 DIAGNOSIS — R1084 Generalized abdominal pain: Secondary | ICD-10-CM | POA: Diagnosis not present

## 2018-10-13 DIAGNOSIS — M329 Systemic lupus erythematosus, unspecified: Secondary | ICD-10-CM | POA: Diagnosis not present

## 2018-10-13 DIAGNOSIS — Z3A23 23 weeks gestation of pregnancy: Secondary | ICD-10-CM | POA: Insufficient documentation

## 2018-10-13 DIAGNOSIS — R109 Unspecified abdominal pain: Secondary | ICD-10-CM | POA: Diagnosis not present

## 2018-10-13 DIAGNOSIS — Z79899 Other long term (current) drug therapy: Secondary | ICD-10-CM | POA: Insufficient documentation

## 2018-10-13 DIAGNOSIS — J45909 Unspecified asthma, uncomplicated: Secondary | ICD-10-CM | POA: Insufficient documentation

## 2018-10-13 LAB — URINALYSIS, ROUTINE W REFLEX MICROSCOPIC
Bilirubin Urine: NEGATIVE
Glucose, UA: NEGATIVE mg/dL
Ketones, ur: NEGATIVE mg/dL
Leukocytes,Ua: NEGATIVE
Nitrite: NEGATIVE
Protein, ur: NEGATIVE mg/dL
SPECIFIC GRAVITY, URINE: 1.014 (ref 1.005–1.030)
pH: 7 (ref 5.0–8.0)

## 2018-10-13 NOTE — MAU Note (Signed)
Pt feels like she's having contractions since last night. They feel about 15 minutes apart. No LOF or bleeding.

## 2018-10-13 NOTE — MAU Provider Note (Signed)
Chief Complaint:  Contractions   None    HPI: Hayley Webb is a 33 y.o. G3P1011 at [redacted]w[redacted]d who presents to maternity admissions reporting abdominal cramping. Denies leakage of fluid or bleeding. Denies intercourse.  Frequent urination.  Denies constipation.  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  Pt was just treated for a yeast infection last week.   Location:diffuse abdominal pain Quality:mild Severity: 3/10 in pain scale Duration: today Context: Modifying factors: drank water Associated signs and symptoms: denies  Denies  leakage of fluid or vaginal bleeding. Good fetal movement.   Pregnancy Course:   Past Medical History:  Diagnosis Date  . Anxiety    doing good now  . Asthma   . Headache(784.0)   . HSV infection   . Hypertension   . Infection    UTI  . Lupus (HCC)    dx age 11  . Pulmonary embolism (HCC) 06/4/82011  . STD (sexually transmitted disease) 02/2009   POSITIVE GC   OB History  Gravida Para Term Preterm AB Living  3 1 1   1 1   SAB TAB Ectopic Multiple Live Births  1       1    # Outcome Date GA Lbr Len/2nd Weight Sex Delivery Anes PTL Lv  3 Current           2 Term 10/25/13 [redacted]w[redacted]d 09:43 / 00:14  M Vag-Vacuum EPI  LIV  1 SAB            Past Surgical History:  Procedure Laterality Date  . ADENOIDECTOMY    . TONSILLECTOMY AND ADENOIDECTOMY  1998  . WISDOM TOOTH EXTRACTION     Family History  Problem Relation Age of Onset  . Diabetes Maternal Aunt   . Cancer Maternal Grandmother 72       COLON CA  . Diabetes Maternal Grandmother   . Hypertension Maternal Grandfather   . Cancer Maternal Grandfather        prostate  . Asthma Mother   . Asthma Brother   . Cancer Paternal Grandmother        breast  . Lupus Paternal Grandmother   . Lupus Paternal Aunt   . Stroke Paternal Aunt   . Anesthesia problems Neg Hx   . Hypotension Neg Hx   . Malignant hyperthermia Neg Hx   . Pseudochol deficiency Neg Hx    Social History   Tobacco Use  . Smoking  status: Never Smoker  . Smokeless tobacco: Never Used  Substance Use Topics  . Alcohol use: No  . Drug use: No   Allergies  Allergen Reactions  . Bactrim Anaphylaxis and Hives  . Hydrocodone-Acetaminophen Anaphylaxis  . Nifedipine Swelling and Anaphylaxis    Swelling of tongue  . Pineapple Itching  . Prednisone Anaphylaxis and Hives    Has been on Dexamethasone without issue.  . Shellfish Allergy Itching  . Sulfamethoxazole-Trimethoprim Other (See Comments)    Complications due to lupus  . Nsaids Other (See Comments)    Has Lupus, reccommended to avoid NSAIDs  . Other Itching    Shrimp:throat itching "can eat other shellfish"  . Vicodin [Hydrocodone-Acetaminophen] Hives and Swelling    Can take Percocet without difficulty   Medications Prior to Admission  Medication Sig Dispense Refill Last Dose  . cholecalciferol (VITAMIN D) 1000 units tablet Take 1,000 Units by mouth daily.   10/13/2018 at Unknown time  . Doxylamine-Pyridoxine ER (BONJESTA) 20-20 MG TBCR Bonjesta 20 mg-20 mg tablet,immediate and delay  release  Take 1 tablet twice a day by oral route.   Past Month at Unknown time  . enoxaparin (LOVENOX) 40 MG/0.4ML injection Inject 1.5 mLs (150 mg total) into the skin every 12 (twelve) hours.   10/13/2018 at Unknown time  . hydroxychloroquine (PLAQUENIL) 200 MG tablet Take 100 mg by mouth 2 (two) times daily.   10/13/2018 at Unknown time  . labetalol (NORMODYNE) 200 MG tablet Take 2 tablets (400 mg total) by mouth 2 (two) times daily. 60 tablet 5 10/13/2018 at Unknown time  . ondansetron (ZOFRAN) 4 MG tablet Take 8 mg by mouth 2 (two) times daily.   Past Month at Unknown time  . Prenatal Vit-Fe Fumarate-FA (MULTIVITAMIN-PRENATAL) 27-0.8 MG TABS tablet Take 1 tablet by mouth daily at 12 noon.   10/13/2018 at Unknown time  . promethazine (PHENERGAN) 25 MG tablet Take 0.5-1 tablets (12.5-25 mg total) by mouth every 6 (six) hours as needed. 30 tablet 0 Past Month at Unknown time  .  terconazole (TERAZOL 7) 0.4 % vaginal cream terconazole 0.4 % vaginal cream  Insert 1 applicatorful every day by vaginal route for 7 days.   Past Month at Unknown time  . albuterol (PROVENTIL HFA;VENTOLIN HFA) 108 (90 Base) MCG/ACT inhaler Inhale 2 puffs into the lungs every 6 (six) hours as needed for wheezing or shortness of breath.   More than a month at Unknown time  . albuterol (PROVENTIL) (2.5 MG/3ML) 0.083% nebulizer solution Take 2.5 mg by nebulization every 6 (six) hours as needed for wheezing or shortness of breath.   More than a month at Unknown time  . aspirin EC 81 MG tablet Take 1 tablet (81 mg total) by mouth daily. (Patient not taking: Reported on 09/04/2018) 90 tablet 3 Not Taking  . metoCLOPramide (REGLAN) 10 MG tablet Take 1 tablet (10 mg total) by mouth every 6 (six) hours. (Patient not taking: Reported on 10/02/2018) 30 tablet 0 More than a month at Unknown time    I have reviewed patient's Past Medical Hx, Surgical Hx, Family Hx, Social Hx, medications and allergies.   ROS:  Review of Systems  Constitutional: Negative.   HENT: Negative.   Eyes: Negative.   Respiratory: Negative.   Cardiovascular: Negative.   Gastrointestinal: Positive for abdominal pain.  Endocrine: Negative.   Genitourinary: Negative.   Musculoskeletal: Negative.   Skin: Negative.   Allergic/Immunologic: Negative.   Neurological: Negative.   Hematological: Negative.   Psychiatric/Behavioral: Negative.     Physical Exam   Patient Vitals for the past 24 hrs:  BP Temp Pulse Resp SpO2 Weight  10/13/18 1717 137/81 97.6 F (36.4 C) 99 18 - -  10/13/18 1716 - - - - 100 % (!) 149.2 kg   Constitutional: Well-developed, well-nourished female in no acute distress.  Cardiovascular: normal rate Respiratory: normal effort GI: Abd soft, non-tender, gravid appropriate for gestational age. Pos BS x 4 MS: Extremities nontender, no edema, normal ROM Neurologic: Alert and oriented x 4.  GU: Neg  CVAT.  Pelvic: NEFG, physiologic discharge, no blood, cervix clean. No CMT  Dilation: Closed Exam by:: Soriya Worster cnm  FHT:  Baseline 140 moderate variability, accelerations present, no decelerations Contractions: none   Labs: Results for orders placed or performed during the hospital encounter of 10/13/18 (from the past 24 hour(s))  Urinalysis, Routine w reflex microscopic     Status: Abnormal   Collection Time: 10/13/18  5:25 PM  Result Value Ref Range   Color, Urine YELLOW YELLOW   APPearance  CLEAR CLEAR   Specific Gravity, Urine 1.014 1.005 - 1.030   pH 7.0 5.0 - 8.0   Glucose, UA NEGATIVE NEGATIVE mg/dL   Hgb urine dipstick SMALL (A) NEGATIVE   Bilirubin Urine NEGATIVE NEGATIVE   Ketones, ur NEGATIVE NEGATIVE mg/dL   Protein, ur NEGATIVE NEGATIVE mg/dL   Nitrite NEGATIVE NEGATIVE   Leukocytes,Ua NEGATIVE NEGATIVE   RBC / HPF 0-5 0 - 5 RBC/hpf   WBC, UA 0-5 0 - 5 WBC/hpf   Bacteria, UA RARE (A) NONE SEEN   Squamous Epithelial / LPF 0-5 0 - 5   Mucus PRESENT     Imaging:  [place] [facility] Ob Follow Up  Result Date: 10/02/2018 ----------------------------------------------------------------------  OBSTETRICS REPORT                       (Signed Final 10/02/2018 11:33 am) ---------------------------------------------------------------------- Patient Info  ID #:       [id number]                          D.O.B.:  08/06/1985 (32 yrs)  Name:       Hayley Webb                 Visit Date: 10/02/2018 11:13 am ---------------------------------------------------------------------- Performed By  Performed By:     Marcellina Millin          Ref. Address:     [facility]                                                             Obstetrics &                                                             Gynecology                                                             [address]                                                              [phone]  [place], [place]                                                             [address]  Attending:        Noralee Space MD        Location[facility]  Referred By:      Kirkland Hun MD ---------------------------------------------------------------------- Orders   #  Description                          Code         Ordered By   1  [place] [facility]FM OB FOLLOW UP                  60454.09     Lin Landsman  ----------------------------------------------------------------------   #  Order #                    Accession #                 Episode #   1  [id number]                  [id number]                  [id number]  ---------------------------------------------------------------------- Indications   Antenatal follow-up for nonvisualized fetal    Z36.2   anatomy   [redacted] weeks gestation of pregnancy                Z3A.21   Encounter for antenatal screening for          Z36.3   malformations(insufficient fetal DNA x2 Nips,   AFP neg)   Maternal morbid obesity                        O99.210 E66.01   Systemic lupus complicating pregnancy,         O26.892, M32.9   second trimester   Medical complication of pregnancy              O26.90   (recurrent PE, on Lovenox)   Hypertension - Chronic/Pre-existing            O10.019   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 Cord Insertion:      Visualized  Amniotic Fluid  AFI FV:      Within normal limits ---------------------------------------------------------------------- Biometry  BPD:      51.5  mm     G. Age:  21w 5d         56  %    CI:        75.75   %    70 - 86                                                          FL/HC:      18.4   %    15.9 - 20.3  HC:      187.6  mm     G. Age:  21w 0d         25  %    HC/AC:      1.13        1.06 - 1.25  AC:      166.5  mm     G. Age:  21w 5d         51  %    FL/BPD:     67.2   %  FL:       34.6  mm     G. Age:  20w 6d         23  %    FL/AC:      20.8   %    20 - 24  Est. FW:     415  gm    0 lb 15 oz      42  % ---------------------------------------------------------------------- OB History  Gravidity:    3         Term:   1        Prem:   0        SAB:   1  TOP:          0       Ectopic:  0        Living: 1 ---------------------------------------------------------------------- Gestational Age  LMP:           23w 6d        Date:  04/18/18                 EDD:   01/23/19  U/S Today:     21w 2d                                        EDD:   02/10/19  Best:          21w 3d     Det. ByMarcella Dubs         EDD:   02/09/19                                      (  06/26/18) ---------------------------------------------------------------------- Anatomy  Cranium:               Appears normal         Aortic Arch:            Appears normal  Cavum:                 Appears normal         Ductal Arch:            Appears normal  Ventricles:            Appears normal         Diaphragm:              Appears normal  Choroid Plexus:        Appears normal         Stomach:                Appears normal, left                                                                        sided  Cerebellum:            Appears normal         Abdomen:                Appears normal  Posterior Fossa:       Appears normal         Abdominal Wall:          Previously seen  Nuchal Fold:           Previously seen        Cord Vessels:           Previously seen  Face:                  Appears normal         Kidneys:                Appear normal                         (orbits and profile)  Lips:                  Appears normal         Bladder:                Appears normal  Thoracic:              Appears normal         Spine:                  Previously seen  Heart:                 Appears normal         Upper Extremities:      Previously seen                         (4CH, axis, and  situs)  RVOT:                  Appears normal         Lower Extremities:      Previously seen  LVOT:                  Appears normal  Other:  Fetus appears to be a female. Technically difficult due to fetal position. ---------------------------------------------------------------------- Cervix Uterus Adnexa  Cervix  Length:            4.2  cm.  Normal appearance by transabdominal scan. ---------------------------------------------------------------------- Impression  Patient returned for completion of fetal anatomy.  Patient returned for fetal growth assessment. She has SLE  and antiphospholipid syndrome and history of pulmonary  embolism. She takes lovenox prophylaxis. Patient had MFM  consultation with Dr. Grace Bushy on her previous visit on  09/04/2018 (see note in EPIC).  She has chronic hypertension that is well-controlled on  labetalol. Her previous child had first-degree heart block and  he is doing well now.  Patient has been having serial fetal echocardiography (PR  interval monitoring).  On today's ultrasound, fetal growth is appropriate for  gestational age. Amniotic fluid is normal and good fetal  activity is seen. Fetal heart rate and rhythm are normal.  Cardiac anatomy appears normal.  We reassured the patient of the findings. ----------------------------------------------------------------------                  Noralee Space, MD Electronically Signed  Final Report   10/02/2018 11:33 am ----------------------------------------------------------------------   MAU Course: Orders Placed This Encounter  Procedures  . Urinalysis, Routine w reflex microscopic   No orders of the defined types were placed in this encounter.   MDM: UA small blood, EFM reactive no contractions, speculum exam negative  Assessment: 23 week IUP abdominal pain Reactive strip  Plan: Discharge home in stable condition.  Preterm signs and symptoms Follow up at regular visit.   Continue home medications as prescribed  Kenney Webb, [name] 10/13/2018 6:04 PM

## 2018-10-15 LAB — CULTURE, OB URINE: Culture: NO GROWTH

## 2018-10-21 ENCOUNTER — Other Ambulatory Visit: Payer: Self-pay

## 2018-10-31 ENCOUNTER — Ambulatory Visit (HOSPITAL_COMMUNITY): Payer: 59

## 2018-10-31 DIAGNOSIS — Z3492 Encounter for supervision of normal pregnancy, unspecified, second trimester: Secondary | ICD-10-CM | POA: Diagnosis not present

## 2018-10-31 DIAGNOSIS — B373 Candidiasis of vulva and vagina: Secondary | ICD-10-CM | POA: Diagnosis not present

## 2018-10-31 DIAGNOSIS — Z3A25 25 weeks gestation of pregnancy: Secondary | ICD-10-CM | POA: Diagnosis not present

## 2018-10-31 DIAGNOSIS — O0992 Supervision of high risk pregnancy, unspecified, second trimester: Secondary | ICD-10-CM | POA: Diagnosis not present

## 2018-11-04 DIAGNOSIS — M329 Systemic lupus erythematosus, unspecified: Secondary | ICD-10-CM | POA: Diagnosis not present

## 2018-11-21 ENCOUNTER — Ambulatory Visit (HOSPITAL_COMMUNITY)
Admission: RE | Admit: 2018-11-21 | Discharge: 2018-11-21 | Disposition: A | Discharge status: Home / Self Care | Payer: 59 | Source: Ambulatory Visit | Attending: Obstetrics and Gynecology | Admitting: Obstetrics and Gynecology

## 2018-11-21 ENCOUNTER — Ambulatory Visit (HOSPITAL_COMMUNITY): Payer: 59 | Admitting: *Deleted

## 2018-11-21 ENCOUNTER — Other Ambulatory Visit: Payer: Self-pay

## 2018-11-21 ENCOUNTER — Encounter (HOSPITAL_COMMUNITY): Payer: Self-pay | Admitting: *Deleted

## 2018-11-21 VITALS — BP 133/93 | HR 113 | Temp 98.0°F

## 2018-11-21 DIAGNOSIS — Z362 Encounter for other antenatal screening follow-up: Secondary | ICD-10-CM | POA: Diagnosis not present

## 2018-11-21 DIAGNOSIS — I10 Essential (primary) hypertension: Secondary | ICD-10-CM | POA: Insufficient documentation

## 2018-11-21 DIAGNOSIS — Z6841 Body Mass Index (BMI) 40.0 and over, adult: Secondary | ICD-10-CM | POA: Diagnosis present

## 2018-11-22 ENCOUNTER — Other Ambulatory Visit (HOSPITAL_COMMUNITY): Payer: Self-pay | Admitting: *Deleted

## 2018-11-22 DIAGNOSIS — O10913 Unspecified pre-existing hypertension complicating pregnancy, third trimester: Secondary | ICD-10-CM

## 2018-11-27 ENCOUNTER — Encounter (HOSPITAL_COMMUNITY): Payer: Self-pay | Admitting: Pediatric Cardiology

## 2018-11-27 DIAGNOSIS — M329 Systemic lupus erythematosus, unspecified: Secondary | ICD-10-CM | POA: Diagnosis not present

## 2018-11-27 DIAGNOSIS — Z3A29 29 weeks gestation of pregnancy: Secondary | ICD-10-CM | POA: Diagnosis not present

## 2018-11-27 DIAGNOSIS — O9989 Other specified diseases and conditions complicating pregnancy, childbirth and the puerperium: Secondary | ICD-10-CM | POA: Diagnosis not present

## 2018-12-04 IMAGING — US US PELVIS LIMITED
1 series · 14 of 21 positions shown · non-contrast
Comparison: None.

CLINICAL DATA: Left lower quadrant palpable mass

EXAM:
LIMITED ULTRASOUND OF PELVIS
TECHNIQUE: Limited transabdominal ultrasound examination of the pelvis was
performed.

[Series 1: us pelvis limited · 0.06mm/px · 14 of 21 slices shown]
[im 1/21]
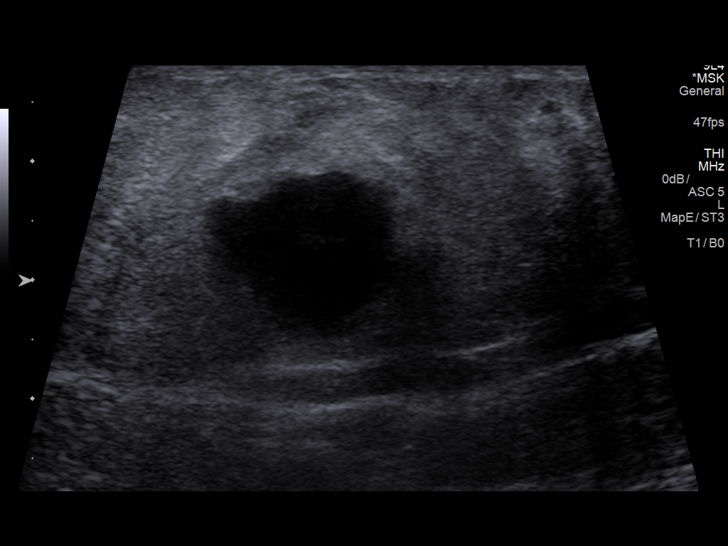
[im 3/21]
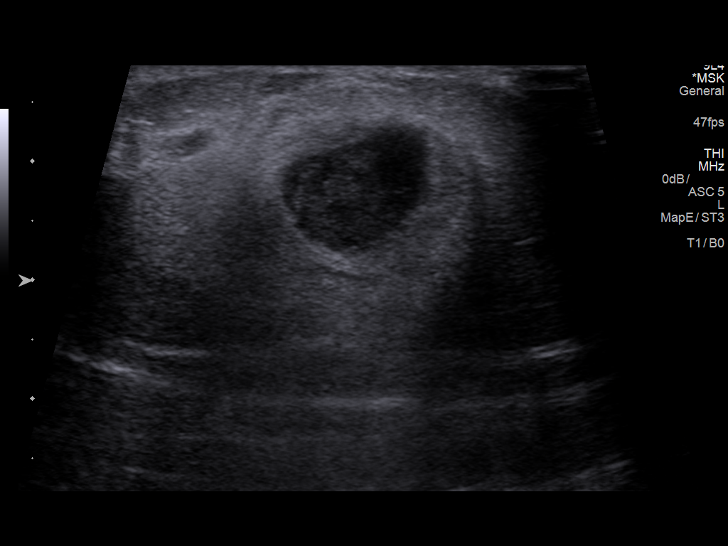
[im 4/21]
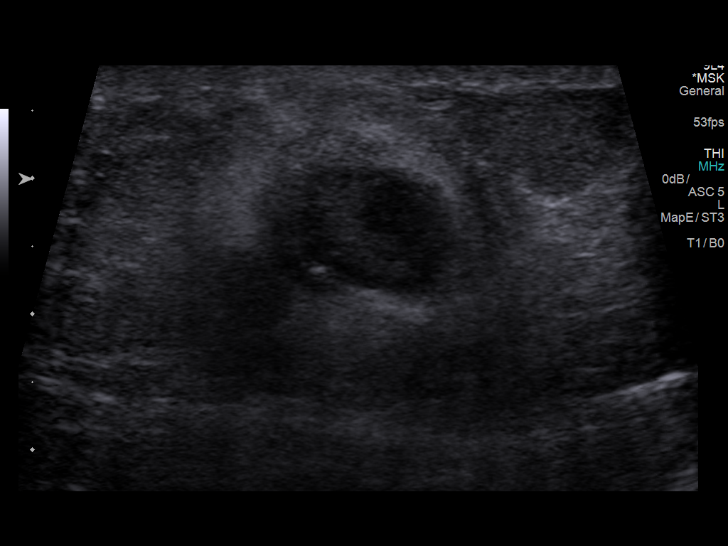
[im 6/21]
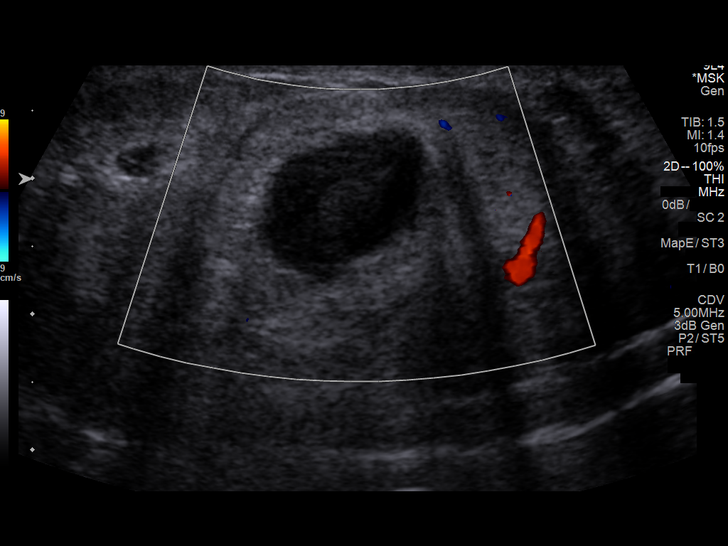
[im 7/21]
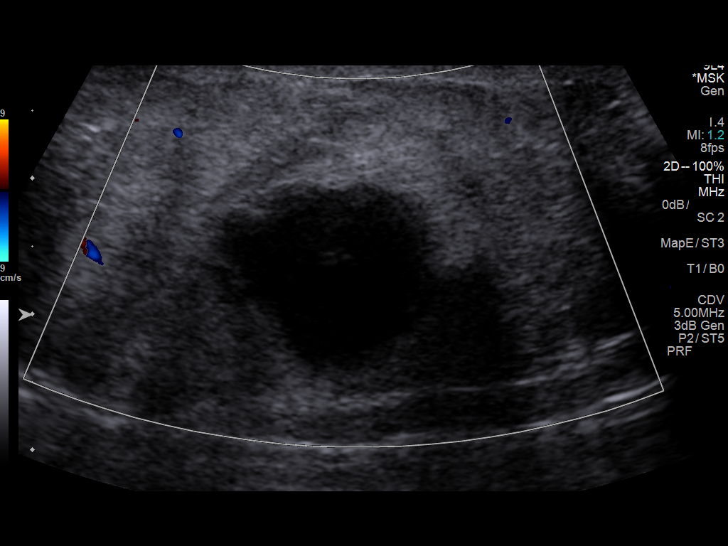
[im 9/21]
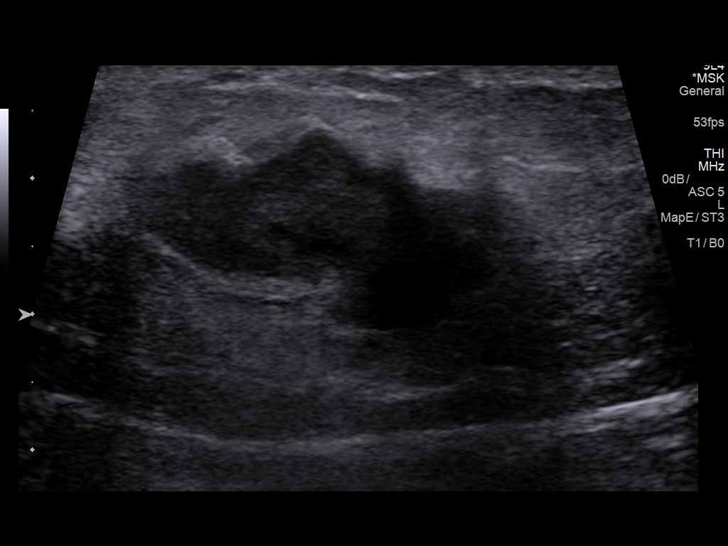
[im 10/21]
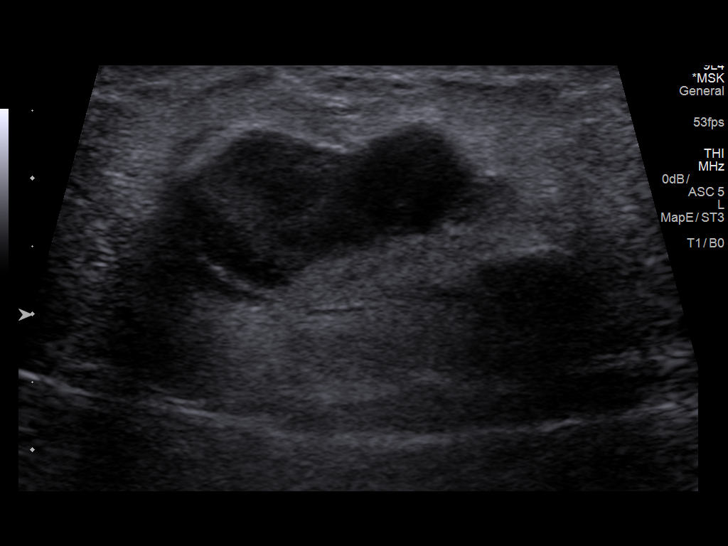
[im 12/21]
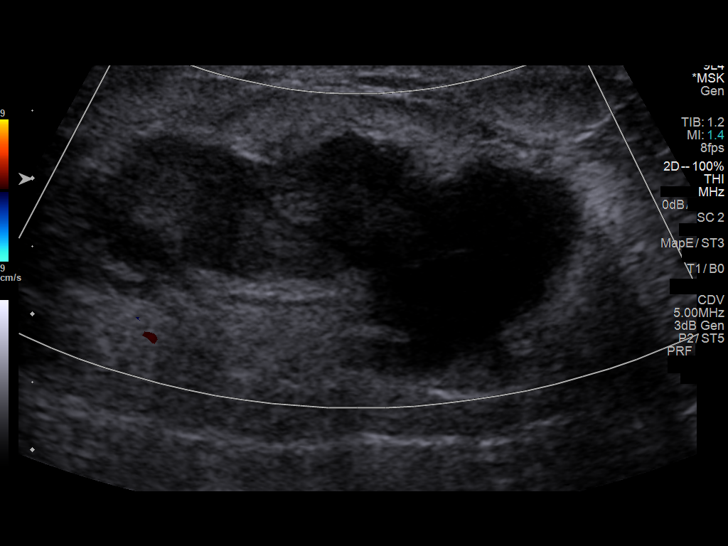
[im 13/21]
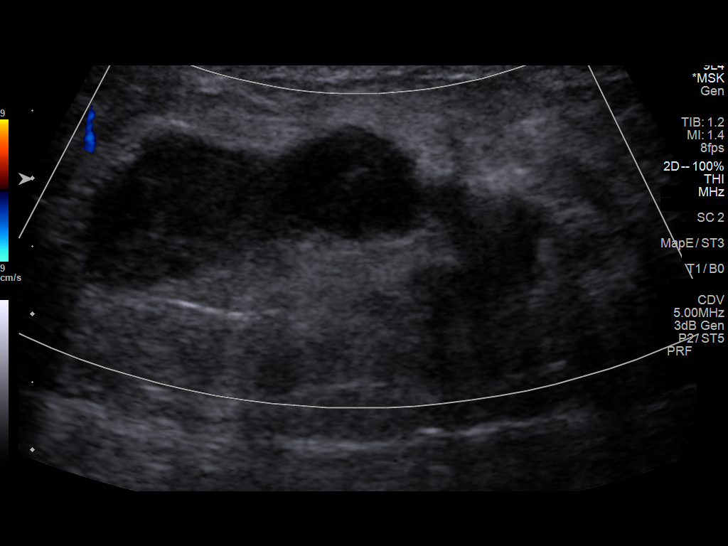
[im 15/21]
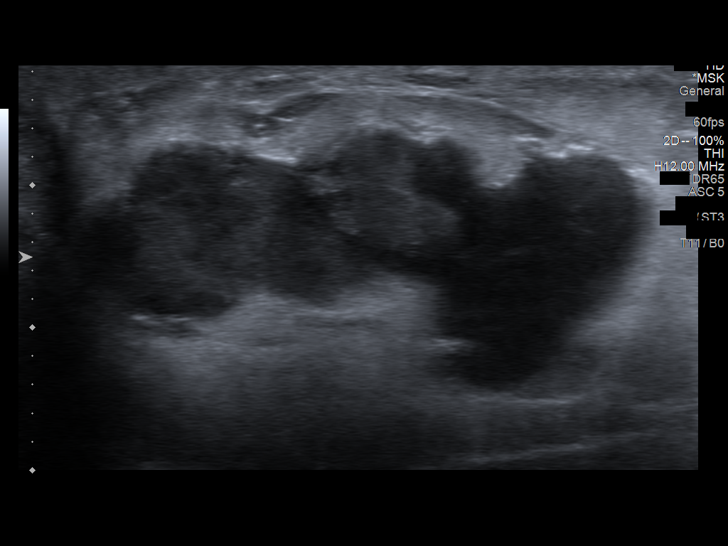
[im 16/21]
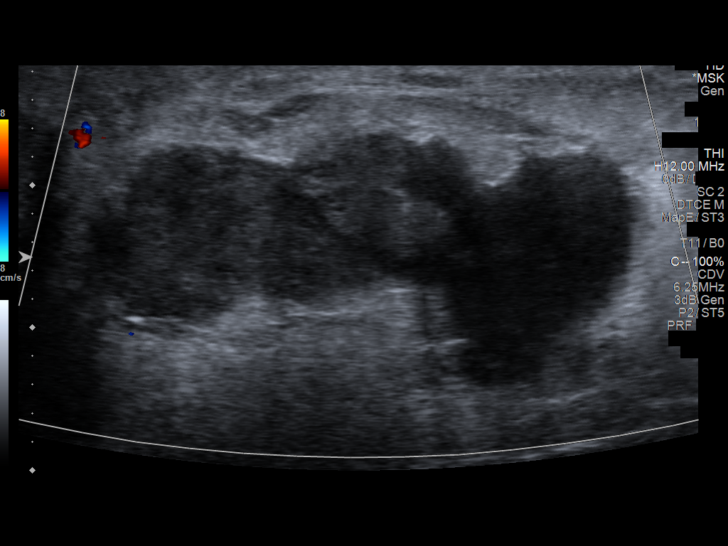
[im 18/21]
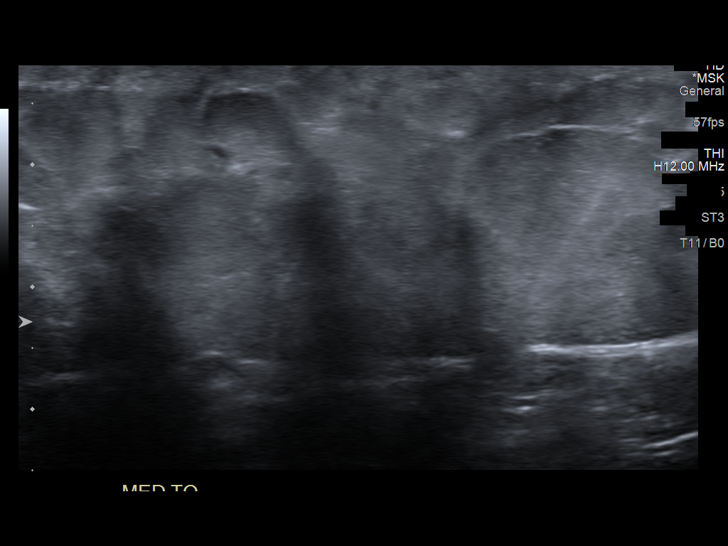
[im 19/21]
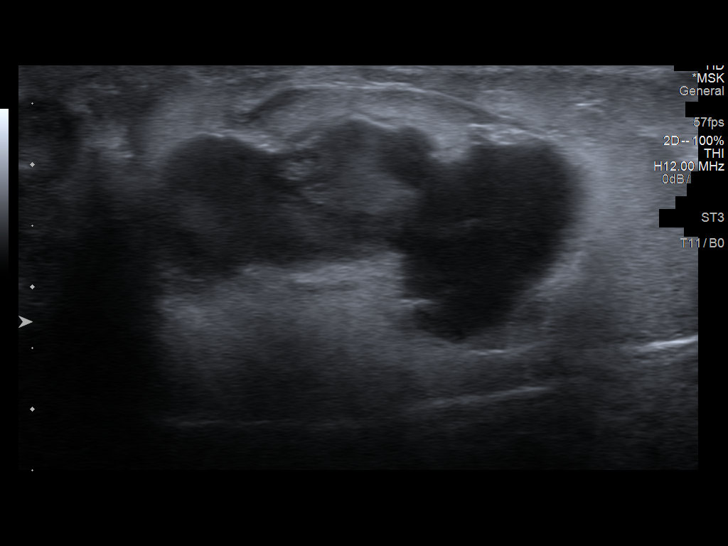
[im 21/21]
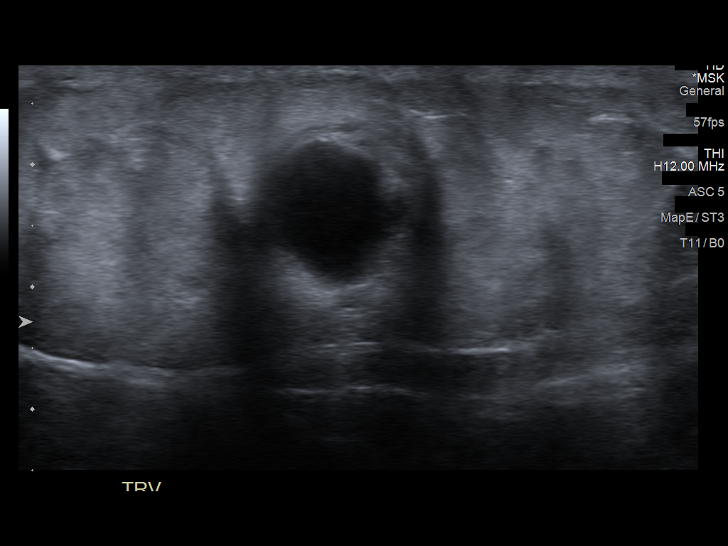

[14 of 21 positions shown; findings below may reference images not displayed]

FINDINGS: An area of clinical concern there is a 4.3 x 1.9 x 1.9 cm palpable
abnormality which shows some central decreased echogenicity noted.
This likely represents a focal hematoma. Patient correlates this
represents a prior site of Lovenox injection.
IMPRESSION: Likely subcutaneous hematoma as described. Recommend clinical
follow-up

## 2018-12-05 ENCOUNTER — Inpatient Hospital Stay (HOSPITAL_COMMUNITY)
Admission: AD | Admit: 2018-12-05 | Discharge: 2018-12-06 | Disposition: A | Discharge status: Home / Self Care | Payer: 59 | Attending: Obstetrics and Gynecology | Admitting: Obstetrics and Gynecology

## 2018-12-05 ENCOUNTER — Other Ambulatory Visit: Payer: Self-pay

## 2018-12-05 ENCOUNTER — Encounter (HOSPITAL_COMMUNITY): Payer: Self-pay

## 2018-12-05 DIAGNOSIS — O4703 False labor before 37 completed weeks of gestation, third trimester: Secondary | ICD-10-CM | POA: Insufficient documentation

## 2018-12-05 DIAGNOSIS — O479 False labor, unspecified: Secondary | ICD-10-CM

## 2018-12-05 DIAGNOSIS — Z3A30 30 weeks gestation of pregnancy: Secondary | ICD-10-CM

## 2018-12-05 DIAGNOSIS — O163 Unspecified maternal hypertension, third trimester: Secondary | ICD-10-CM | POA: Insufficient documentation

## 2018-12-05 DIAGNOSIS — Z886 Allergy status to analgesic agent status: Secondary | ICD-10-CM | POA: Diagnosis not present

## 2018-12-05 DIAGNOSIS — J45909 Unspecified asthma, uncomplicated: Secondary | ICD-10-CM | POA: Insufficient documentation

## 2018-12-05 DIAGNOSIS — Z86711 Personal history of pulmonary embolism: Secondary | ICD-10-CM | POA: Diagnosis not present

## 2018-12-05 DIAGNOSIS — Z881 Allergy status to other antibiotic agents status: Secondary | ICD-10-CM | POA: Insufficient documentation

## 2018-12-05 DIAGNOSIS — Z79899 Other long term (current) drug therapy: Secondary | ICD-10-CM | POA: Diagnosis not present

## 2018-12-05 DIAGNOSIS — Z888 Allergy status to other drugs, medicaments and biological substances status: Secondary | ICD-10-CM | POA: Diagnosis not present

## 2018-12-05 DIAGNOSIS — Z885 Allergy status to narcotic agent status: Secondary | ICD-10-CM | POA: Diagnosis not present

## 2018-12-05 DIAGNOSIS — O99513 Diseases of the respiratory system complicating pregnancy, third trimester: Secondary | ICD-10-CM | POA: Insufficient documentation

## 2018-12-05 DIAGNOSIS — O4700 False labor before 37 completed weeks of gestation, unspecified trimester: Secondary | ICD-10-CM

## 2018-12-05 DIAGNOSIS — Z3689 Encounter for other specified antenatal screening: Secondary | ICD-10-CM

## 2018-12-05 DIAGNOSIS — Z8249 Family history of ischemic heart disease and other diseases of the circulatory system: Secondary | ICD-10-CM | POA: Insufficient documentation

## 2018-12-05 LAB — URINALYSIS, ROUTINE W REFLEX MICROSCOPIC
Bilirubin Urine: NEGATIVE
Glucose, UA: NEGATIVE mg/dL
Ketones, ur: NEGATIVE mg/dL
Leukocytes,Ua: NEGATIVE
Nitrite: NEGATIVE
Protein, ur: 30 mg/dL — AB
Specific Gravity, Urine: 1.021 (ref 1.005–1.030)
pH: 6 (ref 5.0–8.0)

## 2018-12-05 LAB — FETAL FIBRONECTIN: Fetal Fibronectin: NEGATIVE

## 2018-12-05 LAB — WET PREP, GENITAL
Sperm: NONE SEEN
Trich, Wet Prep: NONE SEEN
Yeast Wet Prep HPF POC: NONE SEEN

## 2018-12-05 NOTE — MAU Note (Addendum)
Ctxs for hour and half. Denies LOF or bleeding. On Labetalol 200mg  BID for chronic HTN - has had both doses for today

## 2018-12-05 NOTE — MAU Provider Note (Signed)
History     CSN: [id number]  Arrival date and time: 12/05/18 2106   First Provider Initiated Contact with Patient 12/05/18 2201      Chief Complaint  Patient presents with   Contractions   [name] is a 33 y.o. G3P1011 at [redacted]w[redacted]d who presents to MAU for ctx q86min. Pt reports when she felt the ctx start they "cut her breath." Pt denies anything in the vagina in the last 24hrs.  Onset: 2000 tonight Location: pain around umbilicus, pelvic pressure Duration: ~2hrs Character: intermittent pain q10-17min, pelvic pressure constant, pt reports stomach hardens when the pain comes Aggravating/Associated: none Relieving: none Treatment: none Severity: 8/10  Pt denies VB, LOF, decreased FM, vaginal discharge/odor/itching. Pt denies N/V, constipation, diarrhea, or urinary problems. Pt denies fever, chills, fatigue, sweating or changes in appetite. Pt denies SOB or chest pain. Pt denies dizziness, HA, light-headedness, weakness.  Problems this pregnancy include: SLE, hx blood clots/PE, cHTN. Allergies? see chart list Current medications/supplements? see chart, not taking baby ASA/Bonjesta/reglan/zofran/phenergan; pt is taking labetalol  BID and took both doses today Prenatal care provider? [facility], next appt 12/17/2018   OB History    Gravida  3   Para  1   Term  1   Preterm      AB  1   Living  1     SAB  1   TAB      Ectopic      Multiple      Live Births  1           Past Medical History:  Diagnosis Date   Anxiety    doing good now   Asthma    Headache(784.0)    HSV infection    Hypertension    Infection    UTI   Lupus (HCC)    dx age 60   Pulmonary embolism (HCC) 06/4/82011   STD (sexually transmitted disease) 02/2009   POSITIVE GC    Past Surgical History:  Procedure Laterality Date   ADENOIDECTOMY     TONSILLECTOMY AND ADENOIDECTOMY  1998   WISDOM TOOTH EXTRACTION      Family History  Problem Relation Age of  Onset   Diabetes Maternal Aunt    Cancer Maternal Grandmother 10       COLON CA   Diabetes Maternal Grandmother    Hypertension Maternal Grandfather    Cancer Maternal Grandfather        prostate   Asthma Mother    Asthma Brother    Cancer Paternal Grandmother        breast   Lupus Paternal Grandmother    Lupus Paternal Aunt    Stroke Paternal Aunt    Anesthesia problems Neg Hx    Hypotension Neg Hx    Malignant hyperthermia Neg Hx    Pseudochol deficiency Neg Hx     Social History   Tobacco Use   Smoking status: Never Smoker   Smokeless tobacco: Never Used  Substance Use Topics   Alcohol use: No   Drug use: No    Allergies:  Allergies  Allergen Reactions   Bactrim Anaphylaxis and Hives   Hydrocodone-Acetaminophen Anaphylaxis   Nifedipine Swelling and Anaphylaxis    Swelling of tongue   Pineapple Itching   Prednisone Anaphylaxis and Hives    Has been on Dexamethasone without issue.   Shellfish Allergy Itching   Sulfamethoxazole-Trimethoprim Other (See Comments)    Complications due to lupus   Nsaids Other (See Comments)  Has Lupus, reccommended to avoid NSAIDs   Other Itching    Shrimp:throat itching "can eat other shellfish"   Vicodin [Hydrocodone-Acetaminophen] Hives and Swelling    Can take Percocet without difficulty    Medications Prior to Admission  Medication Sig Dispense Refill Last Dose   enoxaparin (LOVENOX) 40 MG/0.4ML injection Inject 1.5 mLs (150 mg total) into the skin every 12 (twelve) hours.   12/05/2018 at Unknown time   hydroxychloroquine (PLAQUENIL) 200 MG tablet Take 100 mg by mouth 2 (two) times daily.   12/05/2018 at Unknown time   labetalol (NORMODYNE) 200 MG tablet Take 2 tablets (400 mg total) by mouth 2 (two) times daily. 60 tablet 5 12/05/2018 at 2000   Prenatal Vit-Fe Fumarate-FA (MULTIVITAMIN-PRENATAL) 27-0.8 MG TABS tablet Take 1 tablet by mouth daily at 12 noon.   12/05/2018 at Unknown time    albuterol (PROVENTIL HFA;VENTOLIN HFA) 108 (90 Base) MCG/ACT inhaler Inhale 2 puffs into the lungs every 6 (six) hours as needed for wheezing or shortness of breath.   Taking   albuterol (PROVENTIL) (2.5 MG/3ML) 0.083% nebulizer solution Take 2.5 mg by nebulization every 6 (six) hours as needed for wheezing or shortness of breath.   Taking   aspirin EC 81 MG tablet Take 1 tablet (81 mg total) by mouth daily. (Patient not taking: Reported on 09/04/2018) 90 tablet 3 Not Taking   cholecalciferol (VITAMIN D) 1000 units tablet Take 1,000 Units by mouth daily.   Taking   Doxylamine-Pyridoxine ER (BONJESTA) 20-20 MG TBCR Bonjesta 20 mg-20 mg tablet,immediate and delay release  Take 1 tablet twice a day by oral route.   Taking   metoCLOPramide (REGLAN) 10 MG tablet Take 1 tablet (10 mg total) by mouth every 6 (six) hours. (Patient not taking: Reported on 10/02/2018) 30 tablet 0 More than a month at Unknown time   ondansetron (ZOFRAN) 4 MG tablet Take 8 mg by mouth 2 (two) times daily.   Taking   promethazine (PHENERGAN) 25 MG tablet Take 0.5-1 tablets (12.5-25 mg total) by mouth every 6 (six) hours as needed. 30 tablet 0 Taking   terconazole (TERAZOL 7) 0.4 % vaginal cream terconazole 0.4 % vaginal cream  Insert 1 applicatorful every day by vaginal route for 7 days.   Taking    Review of Systems  Constitutional: Negative for chills, diaphoresis, fatigue and fever.  Respiratory: Negative for shortness of breath.   Cardiovascular: Negative for chest pain.  Gastrointestinal: Positive for abdominal pain. Negative for constipation, diarrhea, nausea and vomiting.  Genitourinary: Positive for pelvic pain. Negative for dysuria, flank pain, frequency, urgency, vaginal bleeding, vaginal discharge and vaginal pain.  Neurological: Negative for dizziness, weakness, light-headedness and headaches.   Physical Exam   Blood pressure 136/77, pulse (!) 141, temperature 98.2 F (36.8 C), height 5' 8.5" (1.74 m),  weight (!) 150.6 kg, last menstrual period 04/18/2018, SpO2 99 %.  Patient Vitals for the past 24 hrs:  BP Temp Pulse SpO2 Height Weight  12/05/18 2216 136/77 -- (!) 141 -- -- --  12/05/18 2201 131/85 -- (!) 118 -- -- --  12/05/18 2155 (!) 144/95 -- (!) 116 -- -- --  12/05/18 2140 137/90 -- -- -- -- --  12/05/18 2138 -- -- (!) 113 99 % -- --  12/05/18 2136 -- 98.2 F (36.8 C) -- -- 5' 8.5" (1.74 m) (!) 150.6 kg   Physical Exam  Constitutional: She is oriented to person, place, and time. She appears well-developed and well-nourished. No distress.  HENT:  Head: Normocephalic and atraumatic.  Respiratory: Effort normal.  GI: Soft. She exhibits no distension and no mass. There is no abdominal tenderness. There is no rebound and no guarding.  Genitourinary: There is no rash, tenderness or lesion on the right labia. There is no rash, tenderness or lesion on the left labia. Uterus is not tender. Cervix exhibits no motion tenderness, no discharge and no friability.    Vaginal discharge (thick, white, scant) present.     No vaginal tenderness or bleeding.  No tenderness or bleeding in the vagina.     Neurological: She is alert and oriented to person, place, and time.  Skin: Skin is warm and dry. She is not diaphoretic.  Psychiatric: She has a normal mood and affect. Her behavior is normal. Judgment and thought content normal.   Results for orders placed or performed during the hospital encounter of 12/05/18 (from the past 24 hour(s))  Urinalysis, Routine w reflex microscopic     Status: Abnormal   Collection Time: 12/05/18 10:00 PM  Result Value Ref Range   Color, Urine YELLOW YELLOW   APPearance CLEAR CLEAR   Specific Gravity, Urine 1.021 1.005 - 1.030   pH 6.0 5.0 - 8.0   Glucose, UA NEGATIVE NEGATIVE mg/dL   Hgb urine dipstick SMALL (A) NEGATIVE   Bilirubin Urine NEGATIVE NEGATIVE   Ketones, ur NEGATIVE NEGATIVE mg/dL   Protein, ur 30 (A) NEGATIVE mg/dL   Nitrite NEGATIVE NEGATIVE    Leukocytes,Ua NEGATIVE NEGATIVE   RBC / HPF 0-5 0 - 5 RBC/hpf   WBC, UA 0-5 0 - 5 WBC/hpf   Bacteria, UA RARE (A) NONE SEEN   Squamous Epithelial / LPF 0-5 0 - 5   Mucus PRESENT   Wet prep, genital     Status: Abnormal   Collection Time: 12/05/18 10:01 PM  Result Value Ref Range   Yeast Wet Prep HPF POC NONE SEEN NONE SEEN   Trich, Wet Prep NONE SEEN NONE SEEN   Clue Cells Wet Prep HPF POC PRESENT (A) NONE SEEN   WBC, Wet Prep HPF POC MANY (A) NONE SEEN   Sperm NONE SEEN   Fetal fibronectin     Status: None   Collection Time: 12/05/18 10:19 PM  Result Value Ref Range   Fetal Fibronectin NEGATIVE NEGATIVE     [place] [facility] Ob Follow Up  Result Date: 11/21/2018 ----------------------------------------------------------------------  OBSTETRICS REPORT                       (Signed Final 11/21/2018 10:55 pm) ---------------------------------------------------------------------- Patient Info  ID #:       [id number]                          D.O.B.:  1986/07/22 (32 yrs)  Name:       Hayley Webb                 Visit Date: 11/21/2018 01:54 pm ---------------------------------------------------------------------- Performed By  Performed By:     Percell Boston          Ref. Address:      [facility]  Obstetrics &                                                              Gynecology                                                              [address]                                                              [address]                                                              [place], [place]                                                              [address]  Attending:        Lin Landsman      Location[facility]          [facility]                    MD                                        [facility]  Referred By:      Kirkland Hun MD ---------------------------------------------------------------------- Orders   #  Description                          Code         Ordered By   1  [place] [facility] OB FOLLOW UP                  60454.09     Noralee Space  ----------------------------------------------------------------------   #  Order #  Accession #                 Episode #   1  [id number]                  [id number]                  [id number]  ---------------------------------------------------------------------- Indications   [redacted] weeks gestation of pregnancy                Z3A.28   Encounter for antenatal screening for          Z36.3   malformations(insufficient fetal DNA x2 Nips,   AFP neg)   Maternal morbid obesity                        O99.210 E66.01   Systemic lupus complicating pregnancy,         O26.892, M32.9   second trimester   Medical complication of pregnancy              O26.90   (recurrent PE, on Lovenox)   Hypertension - Chronic/Pre-existing            O10.019   (labetalol)   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 Cord Insertion:       Visualized, central  Amniotic Fluid  AFI FV:      Within normal limits  AFI Sum(cm)     %Tile       Largest Pocket(cm)  12.61           33          4.41  RUQ(cm)       RLQ(cm)       LUQ(cm)        LLQ(cm)  4.41          4.34          0              3.86 ---------------------------------------------------------------------- Biometry  BPD:      72.9  mm     G. Age:  29w 2d         60  %    CI:        75.57   %    70  - 86                                                          FL/HC:  17.8  %    19.6 - 20.8  HC:      265.9  mm     G. Age:  29w 0d         30  %    HC/AC:       1.13       0.99 - 1.21  AC:      234.5  mm     G. Age:  27w 5d         21  %    FL/BPD:      64.9  %    71 - 87  FL:       47.3  mm     G. Age:  25w 6d        < 3  %    FL/AC:       20.2  %    20 - 24  Est. FW:    1055   gm     2 lb 5 oz     26  % ---------------------------------------------------------------------- OB History  Gravidity:    3         Term:   1        Prem:   0        SAB:   1  TOP:          0       Ectopic:  0        Living: 1 ---------------------------------------------------------------------- Gestational Age  LMP:           31w 0d        Date:  04/18/18                 EDD:   01/23/19  U/S Today:     28w 0d                                        EDD:   02/13/19  Best:          28w 4d     Det. By:  Marcella DubsEarly Ultrasound         EDD:   02/09/19                                      (06/26/18) ---------------------------------------------------------------------- Anatomy  Cranium:               Appears normal         Aortic Arch:            Previously seen  Cavum:                 Previously seen        Ductal Arch:            Previously seen  Ventricles:            Appears normal         Diaphragm:              Previously seen  Choroid Plexus:        Previously seen        Stomach:                Appears normal, left  sided  Cerebellum:            Previously seen        Abdomen:                Previously seen  Posterior Fossa:       Previously seen        Abdominal Wall:         Previously seen  Nuchal Fold:           Previously seen        Cord Vessels:           Previously seen  Face:                  Orbits and profile     Kidneys:                Appear normal                         previously seen  Lips:                  Previously seen        Bladder:                 Appears normal  Thoracic:              Appears normal         Spine:                  Previously seen  Heart:                 Previously seen        Upper Extremities:      Previously seen  RVOT:                  Previously seen        Lower Extremities:      Previously seen  LVOT:                  Previously seen  Other:  Fetus appears to be a female. Technically difficult due to fetal position. ---------------------------------------------------------------------- Cervix Uterus Adnexa  Cervix  Not visualized (advanced GA >24wks) ---------------------------------------------------------------------- Impression  Normal interval growth. ---------------------------------------------------------------------- Recommendations  Continue weekly testing  Repeat growth in 4 weeks. ----------------------------------------------------------------------               Lin Landsman, MD Electronically Signed Final Report   11/21/2018 10:55 pm ----------------------------------------------------------------------  MAU Course  Procedures  MDM -r/o PTL -ctx unable to be palpated while at bedside -EFM: reactive with baseline change, reassuring for gestational age       -baseline: 150 (later baseline changes)       -variability: moderate       -accels: present, 10x10       -decels: absent       -TOCO: no ctx, some irritability -CE: posterior/long/closed -UA: sm hgb/30 PRO/rare bacteria, otherwise WNL, sending for culture based on pt symptoms -fFN: negative -WetPrep: +ClueCells (isolated finding, not requiring treatment), many WBCs, otherwise WNL -GC/CT collected -repeat CE: unchanged -pt reports pain has lessened since staying in MAU -pt discharged to home in stable condition  Orders Placed This Encounter  Procedures   Wet prep, genital    Standing Status:   Standing    Number of Occurrences:   1   Culture, OB Urine    Standing Status:  Standing    Number of Occurrences:   1   Urinalysis,  Routine w reflex microscopic    Standing Status:   Standing    Number of Occurrences:   1   Fetal fibronectin    Standing Status:   Standing    Number of Occurrences:   1   Discharge patient    Order Specific Question:   Discharge disposition    Answer:   01-Home or Self Care [1]    Order Specific Question:   Discharge patient date    Answer:   12/06/2018   No orders of the defined types were placed in this encounter.  Assessment and Plan   1. Preterm uterine contractions   2. [redacted] weeks gestation of pregnancy   3. NST (non-stress test) reactive    Allergies as of 12/06/2018      Reactions   Bactrim Anaphylaxis, Hives   Hydrocodone-acetaminophen Anaphylaxis   Nifedipine Swelling, Anaphylaxis   Swelling of tongue   Pineapple Itching   Prednisone Anaphylaxis, Hives   Has been on Dexamethasone without issue.   Shellfish Allergy Itching   Sulfamethoxazole-trimethoprim Other (See Comments)   Complications due to lupus   Nsaids Other (See Comments)   Has Lupus, reccommended to avoid NSAIDs   Other Itching   Shrimp:throat itching "can eat other shellfish"   Vicodin [hydrocodone-acetaminophen] Hives, Swelling   Can take Percocet without difficulty      Medication List    TAKE these medications   albuterol 108 (90 Base) MCG/ACT inhaler Commonly known as:  VENTOLIN HFA Inhale 2 puffs into the lungs every 6 (six) hours as needed for wheezing or shortness of breath.   albuterol (2.5 MG/3ML) 0.083% nebulizer solution Commonly known as:  PROVENTIL Take 2.5 mg by nebulization every 6 (six) hours as needed for wheezing or shortness of breath.   aspirin EC 81 MG tablet Take 1 tablet (81 mg total) by mouth daily.   Bonjesta 20-20 MG Tbcr Generic drug:  Doxylamine-Pyridoxine ER Bonjesta 20 mg-20 mg tablet,immediate and delay release  Take 1 tablet twice a day by oral route.   cholecalciferol 1000 units tablet Commonly known as:  VITAMIN D Take 1,000 Units by mouth daily.    enoxaparin 40 MG/0.4ML injection Commonly known as:  LOVENOX Inject 1.5 mLs (150 mg total) into the skin every 12 (twelve) hours.   hydroxychloroquine 200 MG tablet Commonly known as:  PLAQUENIL Take 100 mg by mouth 2 (two) times daily.   labetalol 200 MG tablet Commonly known as:  NORMODYNE Take 2 tablets (400 mg total) by mouth 2 (two) times daily.   metoCLOPramide 10 MG tablet Commonly known as:  REGLAN Take 1 tablet (10 mg total) by mouth every 6 (six) hours.   multivitamin-prenatal 27-0.8 MG Tabs tablet Take 1 tablet by mouth daily at 12 noon.   ondansetron 4 MG tablet Commonly known as:  ZOFRAN Take 8 mg by mouth 2 (two) times daily.   promethazine 25 MG tablet Commonly known as:  PHENERGAN Take 0.5-1 tablets (12.5-25 mg total) by mouth every 6 (six) hours as needed.   terconazole 0.4 % vaginal cream Commonly known as:  TERAZOL 7 terconazole 0.4 % vaginal cream  Insert 1 applicatorful every day by vaginal route for 7 days.      -discussed possible spotting from cervical polyps/cervical check -strict PTL/bleeding/return MAU precautions given -discussed appropriate hydration in pregnancy/left-sided laying with ctx -s/sx of BV discussed -pt discharged to home in stable condition  Joni Reining  E Mckinnley Smithey 12/06/2018, 12:49 AM

## 2018-12-06 DIAGNOSIS — O479 False labor, unspecified: Secondary | ICD-10-CM | POA: Diagnosis not present

## 2018-12-06 DIAGNOSIS — Z3689 Encounter for other specified antenatal screening: Secondary | ICD-10-CM

## 2018-12-06 DIAGNOSIS — Z3A30 30 weeks gestation of pregnancy: Secondary | ICD-10-CM | POA: Diagnosis not present

## 2018-12-06 DIAGNOSIS — O4703 False labor before 37 completed weeks of gestation, third trimester: Secondary | ICD-10-CM | POA: Diagnosis not present

## 2018-12-06 LAB — GC/CHLAMYDIA PROBE AMP (~~LOC~~) NOT AT ARMC
Chlamydia: NEGATIVE
Neisseria Gonorrhea: NEGATIVE

## 2018-12-06 NOTE — Discharge Instructions (Signed)

## 2018-12-07 LAB — CULTURE, OB URINE: Culture: <10000 — AB

## 2018-12-10 ENCOUNTER — Inpatient Hospital Stay (HOSPITAL_COMMUNITY): Payer: 59

## 2018-12-10 ENCOUNTER — Inpatient Hospital Stay (HOSPITAL_COMMUNITY)
Admission: AD | Admit: 2018-12-10 | Discharge: 2018-12-11 | Disposition: A | Discharge status: Home / Self Care | Payer: 59 | Attending: Obstetrics & Gynecology | Admitting: Obstetrics & Gynecology

## 2018-12-10 ENCOUNTER — Other Ambulatory Visit: Payer: Self-pay

## 2018-12-10 DIAGNOSIS — O99513 Diseases of the respiratory system complicating pregnancy, third trimester: Secondary | ICD-10-CM | POA: Insufficient documentation

## 2018-12-10 DIAGNOSIS — O99213 Obesity complicating pregnancy, third trimester: Secondary | ICD-10-CM | POA: Insufficient documentation

## 2018-12-10 DIAGNOSIS — O163 Unspecified maternal hypertension, third trimester: Secondary | ICD-10-CM | POA: Insufficient documentation

## 2018-12-10 DIAGNOSIS — R079 Chest pain, unspecified: Secondary | ICD-10-CM | POA: Insufficient documentation

## 2018-12-10 DIAGNOSIS — Z888 Allergy status to other drugs, medicaments and biological substances status: Secondary | ICD-10-CM | POA: Insufficient documentation

## 2018-12-10 DIAGNOSIS — O9989 Other specified diseases and conditions complicating pregnancy, childbirth and the puerperium: Secondary | ICD-10-CM | POA: Diagnosis present

## 2018-12-10 DIAGNOSIS — Z886 Allergy status to analgesic agent status: Secondary | ICD-10-CM | POA: Diagnosis not present

## 2018-12-10 DIAGNOSIS — Z8042 Family history of malignant neoplasm of prostate: Secondary | ICD-10-CM | POA: Diagnosis not present

## 2018-12-10 DIAGNOSIS — Z86711 Personal history of pulmonary embolism: Secondary | ICD-10-CM | POA: Insufficient documentation

## 2018-12-10 DIAGNOSIS — Z825 Family history of asthma and other chronic lower respiratory diseases: Secondary | ICD-10-CM | POA: Insufficient documentation

## 2018-12-10 DIAGNOSIS — O212 Late vomiting of pregnancy: Secondary | ICD-10-CM | POA: Diagnosis not present

## 2018-12-10 DIAGNOSIS — R519 Headache, unspecified: Secondary | ICD-10-CM

## 2018-12-10 DIAGNOSIS — Z833 Family history of diabetes mellitus: Secondary | ICD-10-CM | POA: Diagnosis not present

## 2018-12-10 DIAGNOSIS — J4520 Mild intermittent asthma, uncomplicated: Secondary | ICD-10-CM | POA: Diagnosis not present

## 2018-12-10 DIAGNOSIS — Z79899 Other long term (current) drug therapy: Secondary | ICD-10-CM | POA: Insufficient documentation

## 2018-12-10 DIAGNOSIS — Z885 Allergy status to narcotic agent status: Secondary | ICD-10-CM | POA: Diagnosis not present

## 2018-12-10 DIAGNOSIS — Z91013 Allergy to seafood: Secondary | ICD-10-CM | POA: Insufficient documentation

## 2018-12-10 DIAGNOSIS — E669 Obesity, unspecified: Secondary | ICD-10-CM | POA: Insufficient documentation

## 2018-12-10 DIAGNOSIS — Z881 Allergy status to other antibiotic agents status: Secondary | ICD-10-CM | POA: Diagnosis not present

## 2018-12-10 DIAGNOSIS — Z91018 Allergy to other foods: Secondary | ICD-10-CM | POA: Diagnosis not present

## 2018-12-10 DIAGNOSIS — R51 Headache: Secondary | ICD-10-CM

## 2018-12-10 DIAGNOSIS — Z3A31 31 weeks gestation of pregnancy: Secondary | ICD-10-CM | POA: Diagnosis not present

## 2018-12-10 DIAGNOSIS — Z8249 Family history of ischemic heart disease and other diseases of the circulatory system: Secondary | ICD-10-CM | POA: Diagnosis not present

## 2018-12-10 DIAGNOSIS — H538 Other visual disturbances: Secondary | ICD-10-CM

## 2018-12-10 DIAGNOSIS — Z800 Family history of malignant neoplasm of digestive organs: Secondary | ICD-10-CM | POA: Insufficient documentation

## 2018-12-10 DIAGNOSIS — R42 Dizziness and giddiness: Secondary | ICD-10-CM | POA: Insufficient documentation

## 2018-12-10 DIAGNOSIS — O26893 Other specified pregnancy related conditions, third trimester: Secondary | ICD-10-CM

## 2018-12-10 DIAGNOSIS — Z803 Family history of malignant neoplasm of breast: Secondary | ICD-10-CM | POA: Diagnosis not present

## 2018-12-10 LAB — COMPREHENSIVE METABOLIC PANEL
ALT: 12 U/L (ref 0–44)
AST: 15 U/L (ref 15–41)
Albumin: 2.7 g/dL — ABNORMAL LOW (ref 3.5–5.0)
Alkaline Phosphatase: 85 U/L (ref 38–126)
Anion gap: 9 (ref 5–15)
BUN: <5 mg/dL — ABNORMAL LOW (ref 6–20)
CO2: 19 mmol/L — ABNORMAL LOW (ref 22–32)
Calcium: 9.4 mg/dL (ref 8.9–10.3)
Chloride: 107 mmol/L (ref 98–111)
Creatinine, Ser: 0.55 mg/dL (ref 0.44–1.00)
GFR calc Af Amer: >60 mL/min (ref >60)
GFR calc non Af Amer: >60 mL/min (ref >60)
Glucose, Bld: 122 mg/dL — ABNORMAL HIGH (ref 70–99)
Potassium: 3.5 mmol/L (ref 3.5–5.1)
Sodium: 135 mmol/L (ref 135–145)
Total Bilirubin: 0.3 mg/dL (ref 0.3–1.2)
Total Protein: 6.7 g/dL (ref 6.5–8.1)

## 2018-12-10 LAB — URINALYSIS, ROUTINE W REFLEX MICROSCOPIC
Bilirubin Urine: NEGATIVE
Glucose, UA: NEGATIVE mg/dL
Ketones, ur: NEGATIVE mg/dL
Leukocytes,Ua: NEGATIVE
Nitrite: NEGATIVE
Protein, ur: NEGATIVE mg/dL
Specific Gravity, Urine: 1.016 (ref 1.005–1.030)
pH: 6 (ref 5.0–8.0)

## 2018-12-10 LAB — CBC WITH DIFFERENTIAL/PLATELET
Abs Immature Granulocytes: 0.03 10*3/uL (ref 0.00–0.07)
Basophils Absolute: 0 10*3/uL (ref 0.0–0.1)
Basophils Relative: 0 %
Eosinophils Absolute: 0.1 10*3/uL (ref 0.0–0.5)
Eosinophils Relative: 3 %
HCT: 31.5 % — ABNORMAL LOW (ref 36.0–46.0)
Hemoglobin: 10.6 g/dL — ABNORMAL LOW (ref 12.0–15.0)
Immature Granulocytes: 1 %
Lymphocytes Relative: 24 %
Lymphs Abs: 1.2 10*3/uL (ref 0.7–4.0)
MCH: 28.4 pg (ref 26.0–34.0)
MCHC: 33.7 g/dL (ref 30.0–36.0)
MCV: 84.5 fL (ref 80.0–100.0)
Monocytes Absolute: 0.4 10*3/uL (ref 0.1–1.0)
Monocytes Relative: 7 %
Neutro Abs: 3.4 10*3/uL (ref 1.7–7.7)
Neutrophils Relative %: 65 %
Platelets: 308 10*3/uL (ref 150–400)
RBC: 3.73 MIL/uL — ABNORMAL LOW (ref 3.87–5.11)
RDW: 13.2 % (ref 11.5–15.5)
WBC: 5.2 10*3/uL (ref 4.0–10.5)
nRBC: 0 % (ref 0.0–0.2)

## 2018-12-10 LAB — PROTEIN / CREATININE RATIO, URINE
Creatinine, Urine: 163.98 mg/dL
Protein Creatinine Ratio: 0.12 mg/mg{Cre} (ref 0.00–0.15)
Total Protein, Urine: 19 mg/dL

## 2018-12-10 MED ORDER — IOHEXOL 350 MG/ML SOLN
80.0000 mL | Freq: Once | INTRAVENOUS | Status: AC | PRN
  Administered 2018-12-10: 100 mL via INTRAVENOUS

## 2018-12-10 MED ORDER — ALBUTEROL SULFATE HFA 108 (90 BASE) MCG/ACT IN AERS
2.0000 | INHALATION_SPRAY | Freq: Once | RESPIRATORY_TRACT | Status: AC
  Administered 2018-12-10: 2 via RESPIRATORY_TRACT
  Filled 2018-12-10: qty 6.7

## 2018-12-10 MED ORDER — BUTALBITAL-APAP-CAFFEINE 50-325-40 MG PO TABS
2.0000 | ORAL_TABLET | Freq: Once | ORAL | Status: AC
  Administered 2018-12-10: 2 via ORAL
  Filled 2018-12-10: qty 2

## 2018-12-10 MED ORDER — PROMETHAZINE HCL 25 MG PO TABS
25.0000 mg | ORAL_TABLET | Freq: Once | ORAL | Status: AC
  Administered 2018-12-10: 25 mg via ORAL
  Filled 2018-12-10: qty 1

## 2018-12-10 MED ORDER — ALBUTEROL SULFATE (2.5 MG/3ML) 0.083% IN NEBU: 3.0000 mL | INHALATION_SOLUTION | Freq: Once | RESPIRATORY_TRACT | Status: DC

## 2018-12-10 NOTE — MAU Provider Note (Addendum)
Chief Complaint:  Headache; Emesis; Dizziness; Chest Pain; and jittery   First Provider Initiated Contact with Patient 12/10/18 1713     HPI: Hayley Webb is a 33 y.o. G3P1011 at [redacted]w[redacted]d who presents to maternity admissions reporting  1. HA since yesterday. Decreased to 6/10 today. No meds 2.  Chest pain that feels like a squeezing sensation times a few weeks.  States it feels more like asthma and not like her 3 previous PEs. 3.  Blurry vision while watching TV yesterday.  None now.  Vomited 3 times yesterday.  Still having nausea today.  Unable to eat and drink well.  Chest pain Location: Central Quality: Squeezing Severity: Moderate Duration: Few weeks Context: History of asthma and PE. Timing: Intermittent Modifying factors: Worse with exertion Associated signs and symptoms: Negative for fever, chills, epigastric pain, calf pain, shortness of breath, cough, abdominal pain, vaginal bleeding.  Headache Location: Generalized Quality: Throbbing Severity: 6/10 in pain scale Duration: Less than 48 hours Context: None Timing: Constant Modifying factors: Some improvement with resting in a dark room and relaxation.  Has not tried any medications. Associated signs and symptoms: Positive for blurry vision yesterday.  Negative for difficulties with speech or gait, neck pain, neck stiffness, sinus pressure or congestion.  Denies contractions, leakage of fluid or vaginal bleeding. Good fetal movement.   Pregnancy Course:  Patient Active Problem List   Diagnosis Date Noted  . Chest pain 04/04/2017  . On continuous oral anticoagulation 04/04/2017  . 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 Nonintractable headache 04/04/2017  . Altered thought processes 04/04/2017  . Word finding difficulty 04/04/2017  . Antiphospholipid antibody syndrome (HCC) 02/10/2017  . Pulmonary embolus (HCC) 02/09/2017  . Pulmonary embolism (HCC) 12/26/2016  . Shortness of breath   . Vaginal delivery--VE  assist 10/25/2013  . 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 Obesity-BMI 47 05/09/2013  . Hirsutism 10/02/2012  . STD (female) 01/18/2012  . Lupus (HCC)   . Asthma   . HSV infection      Past Medical History:  Diagnosis Date  . Anxiety    doing good now  . Asthma   . Headache(784.0)   . HSV infection   . Hypertension   . Infection    UTI  . Lupus (HCC)    dx age 65  . Pulmonary embolism (HCC) 06/4/82011  . STD (sexually transmitted disease) 02/2009   POSITIVE GC   OB History  Gravida Para Term Preterm AB Living  SAB TAB Ectopic Multiple Live Births  1       1    # Outcome Date GA Lbr Len/2nd Weight Sex Delivery Anes PTL Lv  3 Current           2 Term 10/25/13 [redacted]w[redacted]d 09:43 / 00:14  M Vag-Vacuum EPI  LIV  1 SAB            Past Surgical History:  Procedure Laterality Date  . ADENOIDECTOMY    . TONSILLECTOMY AND ADENOIDECTOMY  1998  . WISDOM TOOTH EXTRACTION     Family History  Problem Relation Age of Onset  . Diabetes Maternal Aunt   . Cancer Maternal Grandmother 72       COLON CA  . Diabetes Maternal Grandmother   . Hypertension Maternal Grandfather   . Cancer Maternal Grandfather        prostate  . Asthma Mother   .  Asthma Brother   . Cancer Paternal Grandmother        breast  . Lupus Paternal Grandmother   . Lupus Paternal Aunt   . Stroke Paternal Aunt   . Anesthesia problems Neg Hx   . Hypotension Neg Hx   . Malignant hyperthermia Neg Hx   . Pseudochol deficiency Neg Hx    Social History   Tobacco Use  . Smoking status: Never Smoker  . Smokeless tobacco: Never Used  Substance Use Topics  . Alcohol use: No  . Drug use: No   Allergies  Allergen Reactions  . Bactrim Anaphylaxis and Hives  . Hydrocodone-Acetaminophen Anaphylaxis  . Nifedipine Swelling and Anaphylaxis    Swelling of tongue  . Pineapple Itching  . Prednisone Anaphylaxis and Hives    Has been on  Dexamethasone without issue.  . Shellfish Allergy Itching  . Sulfamethoxazole-Trimethoprim Other (See Comments)    Complications due to lupus  . Nsaids Other (See Comments)    Has Lupus, reccommended to avoid NSAIDs  . Other Itching    Shrimp:throat itching "can eat other shellfish"  . Vicodin [Hydrocodone-Acetaminophen] Hives and Swelling    Can take Percocet and Tylenol without reaction.   Medications Prior to Admission  Medication Sig Dispense Refill Last Dose  . cholecalciferol (VITAMIN D) 1000 units tablet Take 1,000 Units by mouth daily.   12/10/2018 at Unknown time  . enoxaparin (LOVENOX) 40 MG/0.4ML injection Inject 1.5 mLs (150 mg total) into the skin every 12 (twelve) hours.   12/10/2018 at Unknown time  . hydroxychloroquine (PLAQUENIL) 200 MG tablet Take 100 mg by mouth 2 (two) times daily.   12/10/2018 at Unknown time  . labetalol (NORMODYNE) 200 MG tablet Take 2 tablets (400 mg total) by mouth 2 (two) times daily. 60 tablet 5 12/10/2018 at Unknown time  . Prenatal Vit-Fe Fumarate-FA (MULTIVITAMIN-PRENATAL) 27-0.8 MG TABS tablet Take 1 tablet by mouth daily at 12 noon.   12/10/2018 at Unknown time  . albuterol (PROVENTIL HFA;VENTOLIN HFA) 108 (90 Base) MCG/ACT inhaler Inhale 2 puffs into the lungs every 6 (six) hours as needed for wheezing or shortness of breath.   More than a month at Unknown time  . albuterol (PROVENTIL) (2.5 MG/3ML) 0.083% nebulizer solution Take 2.5 mg by nebulization every 6 (six) hours as needed for wheezing or shortness of breath.   More than a month at Unknown time  . aspirin EC 81 MG tablet Take 1 tablet (81 mg total) by mouth daily. (Patient not taking: Reported on 09/04/2018) 90 tablet 3 Not Taking  . Doxylamine-Pyridoxine ER (BONJESTA) 20-20 MG TBCR Bonjesta 20 mg-20 mg tablet,immediate and delay release  Take 1 tablet twice a day by oral route.   Taking  . metoCLOPramide (REGLAN) 10 MG tablet Take 1 tablet (10 mg total) by mouth every 6 (six) hours.  (Patient not taking: Reported on 10/02/2018) 30 tablet 0 More than a month at Unknown time  . ondansetron (ZOFRAN) 4 MG tablet Take 8 mg by mouth 2 (two) times daily.   Taking  . promethazine (PHENERGAN) 25 MG tablet Take 0.5-1 tablets (12.5-25 mg total) by mouth every 6 (six) hours as needed. 30 tablet 0 Taking  . terconazole (TERAZOL 7) 0.4 % vaginal cream terconazole 0.4 % vaginal cream  Insert 1 applicatorful every day by vaginal route for 7 days.   Taking    I have reviewed patient's Past Medical Hx, Surgical Hx, Family Hx, Social Hx, medications and allergies.   ROS:  Review  of Systems  Constitutional: Positive for fatigue. Negative for chills and fever.  HENT: Negative for congestion, sinus pressure and sinus pain.   Eyes: Positive for visual disturbance. Negative for photophobia.  Respiratory: Negative for cough, chest tightness, shortness of breath, wheezing and stridor.   Cardiovascular: Positive for chest pain and leg swelling (Mild bilateral). Negative for palpitations.  Gastrointestinal: Positive for nausea and vomiting. Negative for abdominal pain and constipation.  Genitourinary: Negative for vaginal bleeding.  Musculoskeletal: Negative for myalgias, neck pain and neck stiffness.  Neurological: Positive for headaches. Negative for dizziness.  Psychiatric/Behavioral: Negative for confusion.    Physical Exam   Patient Vitals for the past 24 hrs:  BP Temp Temp src Pulse Resp SpO2 Weight  12/10/18 1745 119/78 - - 96 - 100 % -  12/10/18 1730 126/71 - - (!) 104 - 99 % -  12/10/18 1715 116/66 - - (!) 125 - 99 % -  12/10/18 1700 128/78 - - (!) 108 - - -  12/10/18 1653 131/87 97.8 F (36.6 C) - (!) 104 20 99 % -  12/10/18 1615 (!) 142/87 98.2 F (36.8 C) Oral (!) 106 20 99 % (!) 148.8 kg   Constitutional: Well-developed, well-nourished female in no acute distress.  Skin: No pallor, diaphoresis. Cardiovascular: Mild tachycardia.  Regular rate and rhythm.  1/6 systolic  murmur. Respiratory: normal effort.  Scant wheezing in left lower lobe.  Otherwise normal breath sounds.  Normal work of breathing. Chest: Nontender. GI: Abd soft, non-tender, gravid appropriate for gestational age.  MS: Extremities nontender, no edema, normal ROM Neurologic: Alert and oriented x 4.  Normal speech and gait GU: Deferred  FHT:  Baseline 135 , moderate variability, accelerations present, no decelerations Contractions: None   Labs: Results for orders placed or performed during the hospital encounter of 12/10/18 (from the past 24 hour(s))  Urinalysis, Routine w reflex microscopic     Status: Abnormal   Collection Time: 12/10/18  4:39 PM  Result Value Ref Range   Color, Urine YELLOW YELLOW   APPearance CLEAR CLEAR   Specific Gravity, Urine 1.016 1.005 - 1.030   pH 6.0 5.0 - 8.0   Glucose, UA NEGATIVE NEGATIVE mg/dL   Hgb urine dipstick SMALL (A) NEGATIVE   Bilirubin Urine NEGATIVE NEGATIVE   Ketones, ur NEGATIVE NEGATIVE mg/dL   Protein, ur NEGATIVE NEGATIVE mg/dL   Nitrite NEGATIVE NEGATIVE   Leukocytes,Ua NEGATIVE NEGATIVE   RBC / HPF 0-5 0 - 5 RBC/hpf   WBC, UA 0-5 0 - 5 WBC/hpf   Bacteria, UA RARE (A) NONE SEEN   Squamous Epithelial / LPF 0-5 0 - 5   Mucus PRESENT   Protein / creatinine ratio, urine     Status: None   Collection Time: 12/10/18  4:39 PM  Result Value Ref Range   Creatinine, Urine 163.98 mg/dL   Total Protein, Urine 19 mg/dL   Protein Creatinine Ratio 0.12 0.00 - 0.15 mg/mg[Cre]  CBC with Differential/Platelet     Status: Abnormal   Collection Time: 12/10/18  5:07 PM  Result Value Ref Range   WBC 5.2 4.0 - 10.5 K/uL   RBC 3.73 (L) 3.87 - 5.11 MIL/uL   Hemoglobin 10.6 (L) 12.0 - 15.0 g/dL   HCT 26.4 (L) 15.8 - 30.9 %   MCV 84.5 80.0 - 100.0 fL   MCH 28.4 26.0 - 34.0 pg   MCHC 33.7 30.0 - 36.0 g/dL   RDW 40.7 68.0 - 88.1 %   Platelets 308 150 -  400 K/uL   nRBC 0.0 0.0 - 0.2 %   Neutrophils Relative % 65 %   Neutro Abs 3.4 1.7 - 7.7 K/uL    Lymphocytes Relative 24 %   Lymphs Abs 1.2 0.7 - 4.0 K/uL   Monocytes Relative 7 %   Monocytes Absolute 0.4 0.1 - 1.0 K/uL   Eosinophils Relative 3 %   Eosinophils Absolute 0.1 0.0 - 0.5 K/uL   Basophils Relative 0 %   Basophils Absolute 0.0 0.0 - 0.1 K/uL   Immature Granulocytes 1 %   Abs Immature Granulocytes 0.03 0.00 - 0.07 K/uL  Comprehensive metabolic panel     Status: Abnormal   Collection Time: 12/10/18  5:07 PM  Result Value Ref Range   Sodium 135 135 - 145 mmol/L   Potassium 3.5 3.5 - 5.1 mmol/L   Chloride 107 98 - 111 mmol/L   CO2 19 (L) 22 - 32 mmol/L   Glucose, Bld 122 (H) 70 - 99 mg/dL   BUN <5 (L) 6 - 20 mg/dL   Creatinine, Ser 8.110.55 0.44 - 1.00 mg/dL   Calcium 9.4 8.9 - 91.410.3 mg/dL   Total Protein 6.7 6.5 - 8.1 g/dL   Albumin 2.7 (L) 3.5 - 5.0 g/dL   AST 15 15 - 41 U/L   ALT 12 0 - 44 U/L   Alkaline Phosphatase 85 38 - 126 U/L   Total Bilirubin 0.3 0.3 - 1.2 mg/dL   GFR calc non Af Amer >60 >60 mL/min   GFR calc Af Amer >60 >60 mL/min   Anion gap 9 5 - 15    Imaging:  Ct Angio Chest Pe W Or Wo Contrast  Result Date: 12/10/2018 CLINICAL DATA:  33 y/o F; chest pain. History of pulmonary embolus, lupus, [redacted] weeks gestation, tachycardia. EXAM: CT ANGIOGRAPHY CHEST WITH CONTRAST TECHNIQUE: Multidetector CT imaging of the chest was performed using the standard protocol during bolus administration of intravenous contrast. Multiplanar CT image reconstructions and MIPs were obtained to evaluate the vascular anatomy. CONTRAST:  80 cc Omnipaque 350 COMPARISON:  04/17/2019 CT angiogram of the chest. FINDINGS: Cardiovascular: Respiratory motion artifact. Satisfactory opacification of the pulmonary arteries. No evidence of central, lobar, or proximal segmental pulmonary embolism. Suboptimal assessment of downstream pulmonary arteries. Normal heart size. No pericardial effusion. Mediastinum/Nodes: No enlarged mediastinal, hilar, or axillary lymph nodes. Thyroid gland, trachea, and  esophagus demonstrate no significant findings. Lungs/Pleura: Mild air trapping in the lung bases. No consolidation. No pleural effusion or pneumothorax. Upper Abdomen: No acute abnormality. Musculoskeletal: No chest wall abnormality. No acute or significant osseous findings. Review of the MIP images confirms the above findings. IMPRESSION: 1. Respiratory motion artifact. No central, lobar, or proximal segmental pulmonary embolus identified. Suboptimal assessment of downstream pulmonary arteries. 2. Mild air trapping in lower lobes compatible with small airways disease. No focal consolidation. Electronically Signed   By: Mitzi HansenLance  Furusawa-Stratton M.D.   On: 12/10/2018 23:32     MAU Course: Orders Placed This Encounter  Procedures  . CT ANGIO CHEST PE W OR WO CONTRAST  . CBC with Differential/Platelet  . Comprehensive metabolic panel  . Urinalysis, Routine w reflex microscopic  . Protein / creatinine ratio, urine  . Encourage fluids  . EKG 12-Lead  . EKG 12-Lead  . Saline lock IV   Meds ordered this encounter  Medications  . butalbital-acetaminophen-caffeine (FIORICET) 50-325-40 MG per tablet 2 tablet  . promethazine (PHENERGAN) tablet 25 mg  . DISCONTD: albuterol (PROVENTIL) (2.5 MG/3ML) 0.083% nebulizer solution 3  mL  . albuterol (VENTOLIN HFA) 108 (90 Base) MCG/ACT inhaler 2 puff   Headache decreased to 3/10 with Fioricet.  Nausea resolved with Phenergan.  Chest pain continues.  Normal EKG.  Will try albuterol inhaler for chest pain since patient says it feels similar to asthma symptoms.  If no resolution, will recommend CT due to history of PE x3, lupus, pregnant state.  Discussed history, exam, labs with Dr. Adrian Blackwater who agrees with plan of care.  Care of patient turned over to Wynelle Bourgeois, [name] at 9 PM.  Patient awaiting CT.  Katrinka Blazing, [place], [place] 12/10/2018 9:03 PM  Ct Angio Chest Pe W Or Wo Contrast  Result Date: 12/10/2018 CLINICAL DATA:  32 y/o F; chest pain. History of  pulmonary embolus, lupus, [redacted] weeks gestation, tachycardia. EXAM: CT ANGIOGRAPHY CHEST WITH CONTRAST TECHNIQUE: Multidetector CT imaging of the chest was performed using the standard protocol during bolus administration of intravenous contrast. Multiplanar CT image reconstructions and MIPs were obtained to evaluate the vascular anatomy. CONTRAST:  80 cc Omnipaque 350 COMPARISON:  04/17/2019 CT angiogram of the chest. FINDINGS: Cardiovascular: Respiratory motion artifact. Satisfactory opacification of the pulmonary arteries. No evidence of central, lobar, or proximal segmental pulmonary embolism. Suboptimal assessment of downstream pulmonary arteries. Normal heart size. No pericardial effusion. Mediastinum/Nodes: No enlarged mediastinal, hilar, or axillary lymph nodes. Thyroid gland, trachea, and esophagus demonstrate no significant findings. Lungs/Pleura: Mild air trapping in the lung bases. No consolidation. No pleural effusion or pneumothorax. Upper Abdomen: No acute abnormality. Musculoskeletal: No chest wall abnormality. No acute or significant osseous findings. Review of the MIP images confirms the above findings. IMPRESSION: 1. Respiratory motion artifact. No central, lobar, or proximal segmental pulmonary embolus identified. Suboptimal assessment of downstream pulmonary arteries. 2. Mild air trapping in lower lobes compatible with small airways disease. No focal consolidation. Electronically Signed   By: Mitzi Hansen M.D.   On: 12/10/2018 23:32   Discussed findings with Dr Adrian Blackwater Patient states tightness is still there but has no dyspnea or worsening  A:  Single intrauterine pregnancy at [redacted]w[redacted]d Pregnancy headache in third trimester - Plan: Discharge patient  Chest pain, unspecified type - Plan: Discharge patient  Mild intermittent asthma, unspecified whether complicated - Plan: Discharge patient  Blurry vision - Resolved - Plan: Discharge patient   P:   Discharge home       PE  precautions reviewed       Encouraged to call Her office in am to let them know she was here       Encouraged to return here or to other Urgent Care/ED if she develops worsening of symptoms, increase in pain, fever, or other concerning symptoms.    Aviva Signs, [name]

## 2018-12-10 NOTE — Discharge Instructions (Signed)
Third Trimester of Pregnancy The third trimester is from week 28 through week 40 (months 7 through 9). The third trimester is a time when the unborn baby (fetus) is growing rapidly. At the end of the ninth month, the fetus is about 20 inches in length and weighs 6-10 pounds. Body changes during your third trimester Your body will continue to go through many changes during pregnancy. The changes vary from woman to woman. During the third trimester:  Your weight will continue to increase. You can expect to gain 25-35 pounds (11-16 kg) by the end of the pregnancy.  You may begin to get stretch marks on your hips, abdomen, and breasts.  You may urinate more often because the fetus is moving lower into your pelvis and pressing on your bladder.  You may develop or continue to have heartburn. This is caused by increased hormones that slow down muscles in the digestive tract.  You may develop or continue to have constipation because increased hormones slow digestion and cause the muscles that push waste through your intestines to relax.  You may develop hemorrhoids. These are swollen veins (varicose veins) in the rectum that can itch or be painful.  You may develop swollen, bulging veins (varicose veins) in your legs.  You may have increased body aches in the pelvis, back, or thighs. This is due to weight gain and increased hormones that are relaxing your joints.  You may have changes in your hair. These can include thickening of your hair, rapid growth, and changes in texture. Some women also have hair loss during or after pregnancy, or hair that feels dry or thin. Your hair will most likely return to normal after your baby is born.  Your breasts will continue to grow and they will continue to become tender. A yellow fluid (colostrum) may leak from your breasts. This is the first milk you are producing for your baby.  Your belly button may stick out.  You may notice more swelling in your hands,  face, or ankles.  You may have increased tingling or numbness in your hands, arms, and legs. The skin on your belly may also feel numb.  You may feel short of breath because of your expanding uterus.  You may have more problems sleeping. This can be caused by the size of your belly, increased need to urinate, and an increase in your body's metabolism.  You may notice the fetus "dropping," or moving lower in your abdomen (lightening).  You may have increased vaginal discharge.  You may notice your joints feel loose and you may have pain around your pelvic bone. What to expect at prenatal visits You will have prenatal exams every 2 weeks until week 36. Then you will have weekly prenatal exams. During a routine prenatal visit:  You will be weighed to make sure you and the baby are growing normally.  Your blood pressure will be taken.  Your abdomen will be measured to track your baby's growth.  The fetal heartbeat will be listened to.  Any test results from the previous visit will be discussed.  You may have a cervical check near your due date to see if your cervix has softened or thinned (effaced).  You will be tested for Group B streptococcus. This happens between 35 and 37 weeks. Your health care provider may ask you:  What your birth plan is.  How you are feeling.  If you are feeling the baby move.  If you have had any abnormal  symptoms, such as leaking fluid, bleeding, severe headaches, or abdominal cramping.  If you are using any tobacco products, including cigarettes, chewing tobacco, and electronic cigarettes.  If you have any questions. Other tests or screenings that may be performed during your third trimester include:  Blood tests that check for low iron levels (anemia).  Fetal testing to check the health, activity level, and growth of the fetus. Testing is done if you have certain medical conditions or if there are problems during the pregnancy.  Nonstress test  (NST). This test checks the health of your baby to make sure there are no signs of problems, such as the baby not getting enough oxygen. During this test, a belt is placed around your belly. The baby is made to move, and its heart rate is monitored during movement. What is false labor? False labor is a condition in which you feel small, irregular tightenings of the muscles in the womb (contractions) that usually go away with rest, changing position, or drinking water. These are called Braxton Hicks contractions. Contractions may last for hours, days, or even weeks before true labor sets in. If contractions come at regular intervals, become more frequent, increase in intensity, or become painful, you should see your health care provider. What are the signs of labor?  Abdominal cramps.  Regular contractions that start at 10 minutes apart and become stronger and more frequent with time.  Contractions that start on the top of the uterus and spread down to the lower abdomen and back.  Increased pelvic pressure and dull back pain.  A watery or bloody mucus discharge that comes from the vagina.  Leaking of amniotic fluid. This is also known as your "water breaking." It could be a slow trickle or a gush. Let your health care provider know if it has a color or strange odor. If you have any of these signs, call your health care provider right away, even if it is before your due date. Follow these instructions at home: Medicines  Follow your health care provider's instructions regarding medicine use. Specific medicines may be either safe or unsafe to take during pregnancy.  Take a prenatal vitamin that contains at least 600 micrograms (mcg) of folic acid.  If you develop constipation, try taking a stool softener if your health care provider approves. Eating and drinking   Eat a balanced diet that includes fresh fruits and vegetables, whole grains, good sources of protein such as meat, eggs, or tofu,  and low-fat dairy. Your health care provider will help you determine the amount of weight gain that is right for you.  Avoid raw meat and uncooked cheese. These carry germs that can cause birth defects in the baby.  If you have low calcium intake from food, talk to your health care provider about whether you should take a daily calcium supplement.  Eat four or five small meals rather than three large meals a day.  Limit foods that are high in fat and processed sugars, such as fried and sweet foods.  To prevent constipation: ? Drink enough fluid to keep your urine clear or pale yellow. ? Eat foods that are high in fiber, such as fresh fruits and vegetables, whole grains, and beans. Activity  Exercise only as directed by your health care provider. Most women can continue their usual exercise routine during pregnancy. Try to exercise for 30 minutes at least 5 days a week. Stop exercising if you experience uterine contractions.  Avoid heavy lifting.  Do  not exercise in extreme heat or humidity, or at high altitudes.  Wear low-heel, comfortable shoes.  Practice good posture.  You may continue to have sex unless your health care provider tells you otherwise. Relieving pain and discomfort  Take frequent breaks and rest with your legs elevated if you have leg cramps or low back pain.  Take warm sitz baths to soothe any pain or discomfort caused by hemorrhoids. Use hemorrhoid cream if your health care provider approves.  Wear a good support bra to prevent discomfort from breast tenderness.  If you develop varicose veins: ? Wear support pantyhose or compression stockings as told by your healthcare provider. ? Elevate your feet for 15 minutes, 3-4 times a day. Prenatal care  Write down your questions. Take them to your prenatal visits.  Keep all your prenatal visits as told by your health care provider. This is important. Safety  Wear your seat belt at all times when driving.  Make  a list of emergency phone numbers, including numbers for family, friends, the hospital, and police and fire departments. General instructions  Avoid cat litter boxes and soil used by cats. These carry germs that can cause birth defects in the baby. If you have a cat, ask someone to clean the litter box for you.  Do not travel far distances unless it is absolutely necessary and only with the approval of your health care provider.  Do not use hot tubs, steam rooms, or saunas.  Do not drink alcohol.  Do not use any products that contain nicotine or tobacco, such as cigarettes and e-cigarettes. If you need help quitting, ask your health care provider.  Do not use any medicinal herbs or unprescribed drugs. These chemicals affect the formation and growth of the baby.  Do not douche or use tampons or scented sanitary pads.  Do not cross your legs for long periods of time.  To prepare for the arrival of your baby: ? Take prenatal classes to understand, practice, and ask questions about labor and delivery. ? Make a trial run to the hospital. ? Visit the hospital and tour the maternity area. ? Arrange for maternity or paternity leave through employers. ? Arrange for family and friends to take care of pets while you are in the hospital. ? Purchase a rear-facing car seat and make sure you know how to install it in your car. ? Pack your hospital bag. ? Prepare the babys nursery. Make sure to remove all pillows and stuffed animals from the baby's crib to prevent suffocation.  Visit your dentist if you have not gone during your pregnancy. Use a soft toothbrush to brush your teeth and be gentle when you floss. Contact a health care provider if:  You are unsure if you are in labor or if your water has broken.  You become dizzy.  You have mild pelvic cramps, pelvic pressure, or nagging pain in your abdominal area.  You have lower back pain.  You have persistent nausea, vomiting, or  diarrhea.  You have an unusual or bad smelling vaginal discharge.  You have pain when you urinate. Get help right away if:  Your water breaks before 37 weeks.  You have regular contractions less than 5 minutes apart before 37 weeks.  You have a fever.  You are leaking fluid from your vagina.  You have spotting or bleeding from your vagina.  You have severe abdominal pain or cramping.  You have rapid weight loss or weight gain.  You have  shortness of breath with chest pain.  You notice sudden or extreme swelling of your face, hands, ankles, feet, or legs.  Your baby makes fewer than 10 movements in 2 hours.  You have severe headaches that do not go away when you take medicine.  You have vision changes. Summary  The third trimester is from week 28 through week 40, months 7 through 9. The third trimester is a time when the unborn baby (fetus) is growing rapidly.  During the third trimester, your discomfort may increase as you and your baby continue to gain weight. You may have abdominal, leg, and back pain, sleeping problems, and an increased need to urinate.  During the third trimester your breasts will keep growing and they will continue to become tender. A yellow fluid (colostrum) may leak from your breasts. This is the first milk you are producing for your baby.  False labor is a condition in which you feel small, irregular tightenings of the muscles in the womb (contractions) that eventually go away. These are called Braxton Hicks contractions. Contractions may last for hours, days, or even weeks before true labor sets in.  Signs of labor can include: abdominal cramps; regular contractions that start at 10 minutes apart and become stronger and more frequent with time; watery or bloody mucus discharge that comes from the vagina; increased pelvic pressure and dull back pain; and leaking of amniotic fluid. This information is not intended to replace advice given to you by your  health care provider. Make sure you discuss any questions you have with your health care provider. Document Released: 07/04/2001 Document Revised: 08/15/2016 Document Reviewed: 08/15/2016 Elsevier Interactive Patient Education  2019 [employer] Asthma, Adult  Asthma is a long-term (chronic) condition that causes recurrent episodes in which the airways become tight and narrow. The airways are the passages that lead from the nose and mouth down into the lungs. Asthma episodes, also called asthma attacks, can cause coughing, wheezing, shortness of breath, and chest pain. The airways can also fill with mucus. During an attack, it can be difficult to breathe. Asthma attacks can range from minor to life threatening. Asthma cannot be cured, but medicines and lifestyle changes can help control it and treat acute attacks. What are the causes? This condition is believed to be caused by inherited (genetic) and environmental factors, but its exact cause is not known. There are many things that can bring on an asthma attack or make asthma symptoms worse (triggers). Asthma triggers are different for each person. Common triggers include:  Mold.  Dust.  Cigarette smoke.  Cockroaches.  Things that can cause allergy symptoms (allergens), such as animal dander or pollen from trees or grass.  Air pollutants such as household cleaners, wood smoke, smog, or Therapist, occupationalchemical odors.  Cold air, weather changes, and winds (which increase molds and pollen in the air).  Strong emotional expressions such as crying or laughing hard.  Stress.  Certain medicines (such as aspirin) or types of medicines (such as beta-blockers).  Sulfites in foods and drinks. Foods and drinks that may contain sulfites include dried fruit, potato chips, and sparkling grape juice.  Infections or inflammatory conditions such as the flu, a cold, or inflammation of the nasal membranes (rhinitis).  Gastroesophageal reflux disease  (GERD).  Exercise or strenuous activity. What are the signs or symptoms? Symptoms of this condition may occur right after asthma is triggered or many hours later. Symptoms include:  Wheezing. This can sound like whistling when you breathe.  Excessive nighttime or early morning coughing.  Frequent or severe coughing with a common cold.  Chest tightness.  Shortness of breath.  Tiredness (fatigue) with minimal activity. How is this diagnosed? This condition is diagnosed based on:  Your medical history.  A physical exam.  Tests, which may include: ? Lung function studies and pulmonary studies (spirometry). These tests can evaluate the flow of air in your lungs. ? Allergy tests. ? Imaging tests, such as X-rays. How is this treated? There is no cure for this condition, but treatment can help control your symptoms. Treatment for asthma usually involves:  Identifying and avoiding your asthma triggers.  Using medicines to control your symptoms. Generally, two types of medicines are used to treat asthma: ? Controller medicines. These help prevent asthma symptoms from occurring. They are usually taken every day. ? Fast-acting reliever or rescue medicines. These quickly relieve asthma symptoms by widening the narrow and tight airways. They are used as needed and provide short-term relief.  Using supplemental oxygen. This may be needed during a severe episode.  Using other medicines, such as: ? Allergy medicines, such as antihistamines, if your asthma attacks are triggered by allergens. ? Immune medicines (immunomodulators). These are medicines that help control the immune system.  Creating an asthma action plan. An asthma action plan is a written plan for managing and treating your asthma attacks. This plan includes: ? A list of your asthma triggers and how to avoid them. ? Information about when medicines should be taken and when their dosage should be changed. ? Instructions about  using a device called a peak flow meter. A peak flow meter measures how well the lungs are working and the severity of your asthma. It helps you monitor your condition. Follow these instructions at home: Controlling your home environment Control your home environment in the following ways to help avoid triggers and prevent asthma attacks:  Change your heating and air conditioning filter regularly.  Limit your use of fireplaces and wood stoves.  Get rid of pests (such as roaches and mice) and their droppings.  Throw away plants if you see mold on them.  Clean floors and dust surfaces regularly. Use unscented cleaning products.  Try to have someone else vacuum for you regularly. Stay out of rooms while they are being vacuumed and for a short while afterward. If you vacuum, use a dust mask from a hardware store, a double-layered or microfilter vacuum cleaner bag, or a vacuum cleaner with a HEPA filter.  Replace carpet with wood, tile, or vinyl flooring. Carpet can trap dander and dust.  Use allergy-proof pillows, mattress covers, and box spring covers.  Keep your bedroom a trigger-free room.  Avoid pets and keep windows closed when allergens are in the air.  Wash beddings every week in hot water and dry them in a dryer.  Use blankets that are made of polyester or cotton.  Clean bathrooms and kitchens with bleach. If possible, have someone repaint the walls in these rooms with mold-resistant paint. Stay out of the rooms that are being cleaned and painted.  Wash your hands often with soap and water. If soap and water are not available, use hand sanitizer.  Do not allow anyone to smoke in your home. General instructions  Take over-the-counter and prescription medicines only as told by your health care provider. ? Speak with your health care provider if you have questions about how or when to take the medicines. ? Make note if you are requiring more  frequent dosages.  Do not use any  products that contain nicotine or tobacco, such as cigarettes and e-cigarettes. If you need help quitting, ask your health care provider. Also, avoid being exposed to secondhand smoke.  Use a peak flow meter as told by your health care provider. Record and keep track of the readings.  Understand and use the asthma action plan to help minimize, or stop an asthma attack, without needing to seek medical care.  Make sure you stay up to date on your yearly vaccinations as told by your health care provider. This may include vaccines for the flu and pneumonia.  Avoid outdoor activities when allergen counts are high and when air quality is low.  Wear a ski mask that covers your nose and mouth during outdoor winter activities. Exercise indoors on cold days if you can.  Warm up before exercising, and take time for a cool-down period after exercise.  Keep all follow-up visits as told by your health care provider. This is important. Where to find more information  For information about asthma, turn to the Centers for Disease Control and Prevention at http://www.mills-berg.com/.htm  For air quality information, turn to [employer] at [employer] Contact a health care provider if:  You have wheezing, shortness of breath, or a cough even while you are taking medicine to prevent attacks.  The mucus you cough up (sputum) is thicker than usual.  Your sputum changes from clear or white to yellow, green, gray, or bloody.  Your medicines are causing side effects, such as a rash, itching, swelling, or trouble breathing.  You need to use a reliever medicine more than 2-3 times a week.  Your peak flow reading is still at 50-79% of your personal best after following your action plan for 1 hour.  You have a fever. Get help right away if:  You are getting worse and do not respond to treatment during an asthma attack.  You are short of breath when at rest or when doing very little physical  activity.  You have difficulty eating, drinking, or talking.  You have chest pain or tightness.  You develop a fast heartbeat or palpitations.  You have a bluish color to your lips or fingernails.  You are light-headed or dizzy, or you faint.  Your peak flow reading is less than 50% of your personal best.  You feel too tired to breathe normally. Summary  Asthma is a long-term (chronic) condition that causes recurrent episodes in which the airways become tight and narrow. These episodes can cause coughing, wheezing, shortness of breath, and chest pain.  Asthma cannot be cured, but medicines and lifestyle changes can help control it and treat acute attacks.  Make sure you understand how to avoid triggers and how and when to use your medicines.  Asthma attacks can range from minor to life threatening. Get help right away if you have an asthma attack and do not respond to treatment with your usual rescue medicines. This information is not intended to replace advice given to you by your health care provider. Make sure you discuss any questions you have with your health care provider. Document Released: 07/10/2005 Document Revised: 08/14/2016 Document Reviewed: 08/14/2016 Elsevier Interactive Patient Education  2019 [employer].

## 2018-12-10 NOTE — [facility] Note (Signed)
CT notified that pt has IV. They will send transport team.

## 2018-12-10 NOTE — [facility] Note (Signed)
Pt c/o H/A,dizziness,vomiting,nausea, and chest pain since yest.

## 2018-12-10 NOTE — [facility] Note (Signed)
Called CT again they stated they are waiting for the transport team to take pt.

## 2018-12-10 NOTE — [facility] Note (Signed)
Been feeling shaky and jittery since yesterday.  Had a HA last night, not as bad now, just kind of a "buzzing" feeling.  Been throwing up, feeling dizzy and having light chest pain. Chest pain is a squeezing, started yesterday, comes and goes, not like when she had the PE, radiates to left shoulder

## 2018-12-11 DIAGNOSIS — O9989 Other specified diseases and conditions complicating pregnancy, childbirth and the puerperium: Secondary | ICD-10-CM | POA: Diagnosis not present

## 2018-12-17 DIAGNOSIS — O23599 Infection of other part of genital tract in pregnancy, unspecified trimester: Secondary | ICD-10-CM | POA: Diagnosis not present

## 2018-12-17 DIAGNOSIS — Z3A31 31 weeks gestation of pregnancy: Secondary | ICD-10-CM | POA: Diagnosis not present

## 2018-12-17 DIAGNOSIS — Z3492 Encounter for supervision of normal pregnancy, unspecified, second trimester: Secondary | ICD-10-CM | POA: Diagnosis not present

## 2018-12-17 DIAGNOSIS — O99213 Obesity complicating pregnancy, third trimester: Secondary | ICD-10-CM | POA: Diagnosis not present

## 2018-12-19 ENCOUNTER — Ambulatory Visit (HOSPITAL_COMMUNITY)
Admission: RE | Admit: 2018-12-19 | Discharge: 2018-12-19 | Disposition: A | Discharge status: Home / Self Care | Payer: 59 | Source: Ambulatory Visit | Attending: Maternal & Fetal Medicine | Admitting: Maternal & Fetal Medicine

## 2018-12-19 ENCOUNTER — Encounter (HOSPITAL_COMMUNITY): Payer: Self-pay

## 2018-12-19 ENCOUNTER — Other Ambulatory Visit: Payer: Self-pay

## 2018-12-19 ENCOUNTER — Ambulatory Visit (HOSPITAL_COMMUNITY): Payer: 59 | Admitting: *Deleted

## 2018-12-19 VITALS — BP 122/82 | HR 113 | Temp 98.5°F

## 2018-12-19 DIAGNOSIS — O26893 Other specified pregnancy related conditions, third trimester: Secondary | ICD-10-CM | POA: Diagnosis not present

## 2018-12-19 DIAGNOSIS — O10919 Unspecified pre-existing hypertension complicating pregnancy, unspecified trimester: Secondary | ICD-10-CM | POA: Diagnosis present

## 2018-12-19 DIAGNOSIS — O10013 Pre-existing essential hypertension complicating pregnancy, third trimester: Secondary | ICD-10-CM

## 2018-12-19 DIAGNOSIS — O99213 Obesity complicating pregnancy, third trimester: Secondary | ICD-10-CM | POA: Diagnosis not present

## 2018-12-19 DIAGNOSIS — Z362 Encounter for other antenatal screening follow-up: Secondary | ICD-10-CM

## 2018-12-19 DIAGNOSIS — O10913 Unspecified pre-existing hypertension complicating pregnancy, third trimester: Secondary | ICD-10-CM | POA: Insufficient documentation

## 2018-12-19 DIAGNOSIS — M329 Systemic lupus erythematosus, unspecified: Secondary | ICD-10-CM

## 2018-12-19 DIAGNOSIS — O09293 Supervision of pregnancy with other poor reproductive or obstetric history, third trimester: Secondary | ICD-10-CM

## 2018-12-19 DIAGNOSIS — O2693 Pregnancy related conditions, unspecified, third trimester: Secondary | ICD-10-CM

## 2018-12-19 DIAGNOSIS — Z3A32 32 weeks gestation of pregnancy: Secondary | ICD-10-CM

## 2018-12-20 ENCOUNTER — Other Ambulatory Visit (HOSPITAL_COMMUNITY): Payer: Self-pay | Admitting: *Deleted

## 2018-12-20 DIAGNOSIS — O99213 Obesity complicating pregnancy, third trimester: Secondary | ICD-10-CM

## 2018-12-21 ENCOUNTER — Other Ambulatory Visit: Payer: Self-pay

## 2018-12-21 ENCOUNTER — Encounter (HOSPITAL_COMMUNITY): Payer: Self-pay

## 2018-12-21 ENCOUNTER — Inpatient Hospital Stay (HOSPITAL_COMMUNITY)
Admission: AD | Admit: 2018-12-21 | Discharge: 2018-12-21 | Disposition: A | Discharge status: Home / Self Care | Payer: 59 | Attending: Obstetrics & Gynecology | Admitting: Obstetrics & Gynecology

## 2018-12-21 DIAGNOSIS — Z886 Allergy status to analgesic agent status: Secondary | ICD-10-CM | POA: Diagnosis not present

## 2018-12-21 DIAGNOSIS — Z3A32 32 weeks gestation of pregnancy: Secondary | ICD-10-CM | POA: Diagnosis not present

## 2018-12-21 DIAGNOSIS — O99353 Diseases of the nervous system complicating pregnancy, third trimester: Secondary | ICD-10-CM | POA: Insufficient documentation

## 2018-12-21 DIAGNOSIS — O479 False labor, unspecified: Secondary | ICD-10-CM

## 2018-12-21 DIAGNOSIS — Z79899 Other long term (current) drug therapy: Secondary | ICD-10-CM | POA: Insufficient documentation

## 2018-12-21 DIAGNOSIS — Z888 Allergy status to other drugs, medicaments and biological substances status: Secondary | ICD-10-CM | POA: Insufficient documentation

## 2018-12-21 DIAGNOSIS — M329 Systemic lupus erythematosus, unspecified: Secondary | ICD-10-CM | POA: Insufficient documentation

## 2018-12-21 DIAGNOSIS — Z7982 Long term (current) use of aspirin: Secondary | ICD-10-CM | POA: Diagnosis not present

## 2018-12-21 DIAGNOSIS — Z885 Allergy status to narcotic agent status: Secondary | ICD-10-CM | POA: Diagnosis not present

## 2018-12-21 DIAGNOSIS — J45909 Unspecified asthma, uncomplicated: Secondary | ICD-10-CM | POA: Insufficient documentation

## 2018-12-21 DIAGNOSIS — O99513 Diseases of the respiratory system complicating pregnancy, third trimester: Secondary | ICD-10-CM | POA: Insufficient documentation

## 2018-12-21 DIAGNOSIS — Z86711 Personal history of pulmonary embolism: Secondary | ICD-10-CM | POA: Insufficient documentation

## 2018-12-21 DIAGNOSIS — O4703 False labor before 37 completed weeks of gestation, third trimester: Secondary | ICD-10-CM | POA: Diagnosis not present

## 2018-12-21 LAB — CBC WITH DIFFERENTIAL/PLATELET
Abs Immature Granulocytes: 0.02 10*3/uL (ref 0.00–0.07)
Basophils Absolute: 0 10*3/uL (ref 0.0–0.1)
Basophils Relative: 0 %
Eosinophils Absolute: 0.2 10*3/uL (ref 0.0–0.5)
Eosinophils Relative: 3 %
HCT: 33.1 % — ABNORMAL LOW (ref 36.0–46.0)
Hemoglobin: 11 g/dL — ABNORMAL LOW (ref 12.0–15.0)
Immature Granulocytes: 0 %
Lymphocytes Relative: 20 %
Lymphs Abs: 1.3 10*3/uL (ref 0.7–4.0)
MCH: 27.9 pg (ref 26.0–34.0)
MCHC: 33.2 g/dL (ref 30.0–36.0)
MCV: 84 fL (ref 80.0–100.0)
Monocytes Absolute: 0.5 10*3/uL (ref 0.1–1.0)
Monocytes Relative: 7 %
Neutro Abs: 4.5 10*3/uL (ref 1.7–7.7)
Neutrophils Relative %: 70 %
Platelets: 320 10*3/uL (ref 150–400)
RBC: 3.94 MIL/uL (ref 3.87–5.11)
RDW: 13.3 % (ref 11.5–15.5)
WBC: 6.4 10*3/uL (ref 4.0–10.5)
nRBC: 0 % (ref 0.0–0.2)

## 2018-12-21 LAB — URINALYSIS, ROUTINE W REFLEX MICROSCOPIC
Bilirubin Urine: NEGATIVE
Glucose, UA: NEGATIVE mg/dL
Ketones, ur: NEGATIVE mg/dL
Nitrite: NEGATIVE
Protein, ur: NEGATIVE mg/dL
Specific Gravity, Urine: 1.009 (ref 1.005–1.030)
pH: 6 (ref 5.0–8.0)

## 2018-12-21 LAB — COMPREHENSIVE METABOLIC PANEL
ALT: 12 U/L (ref 0–44)
AST: 14 U/L — ABNORMAL LOW (ref 15–41)
Albumin: 2.8 g/dL — ABNORMAL LOW (ref 3.5–5.0)
Alkaline Phosphatase: 115 U/L (ref 38–126)
Anion gap: 11 (ref 5–15)
BUN: <5 mg/dL — ABNORMAL LOW (ref 6–20)
CO2: 19 mmol/L — ABNORMAL LOW (ref 22–32)
Calcium: 9.3 mg/dL (ref 8.9–10.3)
Chloride: 105 mmol/L (ref 98–111)
Creatinine, Ser: 0.53 mg/dL (ref 0.44–1.00)
GFR calc Af Amer: >60 mL/min (ref >60)
GFR calc non Af Amer: >60 mL/min (ref >60)
Glucose, Bld: 105 mg/dL — ABNORMAL HIGH (ref 70–99)
Potassium: 3.6 mmol/L (ref 3.5–5.1)
Sodium: 135 mmol/L (ref 135–145)
Total Bilirubin: 0.3 mg/dL (ref 0.3–1.2)
Total Protein: 6.9 g/dL (ref 6.5–8.1)

## 2018-12-21 LAB — URIC ACID: Uric Acid, Serum: 3.4 mg/dL (ref 2.5–7.1)

## 2018-12-21 LAB — LACTATE DEHYDROGENASE: LDH: 111 U/L (ref 98–192)

## 2018-12-21 LAB — PROTEIN / CREATININE RATIO, URINE
Creatinine, Urine: 78.41 mg/dL
Protein Creatinine Ratio: 0.17 mg/mg{Cre} — ABNORMAL HIGH (ref 0.00–0.15)
Total Protein, Urine: 13 mg/dL

## 2018-12-21 NOTE — [facility] Note (Signed)
Hayley Webb is a 33 y.o. at [redacted]w[redacted]d here in [facility] reporting: contractions since last night, this morning they are more regular and more painful, every 2-5 min. No history of PTL. No bleeding or LOF, + FM. Is on labetalol 250mg  BID for BP but has not taken it yet today  Onset of complaint: last night  Pain score: 9/10  Vitals:   12/21/18 1127  BP: (!) 145/91  Pulse: (!) 111  Resp: 20  Temp: 97.7 F (36.5 C)  SpO2: 99%     FHT:+ FM  Lab orders placed from triage: [facility]

## 2018-12-21 NOTE — MAU Provider Note (Addendum)
Chief Complaint:  Contractions   None    HPI: Hayley Webb is a 33 y.o. G3P1011 at [redacted]w[redacted]d who presents to maternity admissions reporting cxt that have been regular every 2-4 mins for couple hours, pt stated pain is 9/10, cramping that starts in back and radiates around from , like menstrual cramps, pt denies taking anything to make it better, endorses having MB this morning and normal, drinks lots of water already today several tall bottles. Pt has had cxt with previous birth and endorses feeling the same way. Denies leakage of fluid or vaginal bleeding. Good fetal movement. Pt denies recent travels, SOB, HA, RUQ pain, no swelling, no vision changes, no CP, no URI s/sx. Denies cramps being related to heart burn or food intake. Pt stated she has high pain tolerance. Pt endorse cxt feeling better now. Pt has h/o lupus and being followed by rheumatology and nephrology, had elevated pCR in this pregnancy already most likely due to lupus. Pt also has CHTN, pt takes  labetalol BID, but stated she did not take her medication this morning. Pt on lovenox for reoccurring PE.  Pregnancy Course:   Past Medical History:  Diagnosis Date  . Anxiety    doing good now  . Asthma   . Headache(784.0)   . HSV infection   . Hypertension   . Infection    UTI  . Lupus (HCC)    dx age 52  . Pulmonary embolism (HCC) 06/4/82011  . STD (sexually transmitted disease) 02/2009   POSITIVE GC   OB History  Gravida Para Term Preterm AB Living  SAB TAB Ectopic Multiple Live Births  1       1    # Outcome Date GA Lbr Len/2nd Weight Sex Delivery Anes PTL Lv  3 Current           2 Term 10/25/13 [redacted]w[redacted]d 09:43 / 00:14  M Vag-Vacuum EPI  LIV  1 SAB            Past Surgical History:  Procedure Laterality Date  . ADENOIDECTOMY    . TONSILLECTOMY AND ADENOIDECTOMY  1998  . WISDOM TOOTH EXTRACTION     Family History  Problem Relation Age of Onset  . Diabetes Maternal Aunt   . Cancer Maternal  Grandmother 72       COLON CA  . Diabetes Maternal Grandmother   . Hypertension Maternal Grandfather   . Cancer Maternal Grandfather        prostate  . Asthma Mother   . Asthma Brother   . Cancer Paternal Grandmother        breast  . Lupus Paternal Grandmother   . Lupus Paternal Aunt   . Stroke Paternal Aunt   . Anesthesia problems Neg Hx   . Hypotension Neg Hx   . Malignant hyperthermia Neg Hx   . Pseudochol deficiency Neg Hx    Social History   Tobacco Use  . Smoking status: Never Smoker  . Smokeless tobacco: Never Used  Substance Use Topics  . Alcohol use: No  . Drug use: No   Allergies  Allergen Reactions  . Bactrim Anaphylaxis and Hives  . Hydrocodone-Acetaminophen Anaphylaxis  . Nifedipine Swelling and Anaphylaxis    Swelling of tongue  . Pineapple Itching  . Prednisone Anaphylaxis and Hives    Has been on Dexamethasone without issue.  . Shellfish Allergy Itching  . Sulfamethoxazole-Trimethoprim Other (See Comments)    Complications due to  lupus  . Nsaids Other (See Comments)    Has Lupus, reccommended to avoid NSAIDs  . Other Itching    Shrimp:throat itching "can eat other shellfish"  . Vicodin [Hydrocodone-Acetaminophen] Hives and Swelling    Can take Percocet and Tylenol without reaction.   Medications Prior to Admission  Medication Sig Dispense Refill Last Dose  . labetalol (NORMODYNE) 200 MG tablet Take 2 tablets (400 mg total) by mouth 2 (two) times daily. 60 tablet 5 12/20/2018 at Unknown time  . albuterol (PROVENTIL HFA;VENTOLIN HFA) 108 (90 Base) MCG/ACT inhaler Inhale 2 puffs into the lungs every 6 (six) hours as needed for wheezing or shortness of breath.   Taking  . albuterol (PROVENTIL) (2.5 MG/3ML) 0.083% nebulizer solution Take 2.5 mg by nebulization every 6 (six) hours as needed for wheezing or shortness of breath.   Taking  . aspirin EC 81 MG tablet Take 1 tablet (81 mg total) by mouth daily. (Patient not taking: Reported on 09/04/2018) 90  tablet 3 Not Taking  . cholecalciferol (VITAMIN D) 1000 units tablet Take 1,000 Units by mouth daily.   Taking  . Doxylamine-Pyridoxine ER (BONJESTA) 20-20 MG TBCR Bonjesta 20 mg-20 mg tablet,immediate and delay release  Take 1 tablet twice a day by oral route.   Not Taking  . enoxaparin (LOVENOX) 40 MG/0.4ML injection Inject 1.5 mLs (150 mg total) into the skin every 12 (twelve) hours.   Taking  . hydroxychloroquine (PLAQUENIL) 200 MG tablet Take 100 mg by mouth 2 (two) times daily.   Taking  . metoCLOPramide (REGLAN) 10 MG tablet Take 1 tablet (10 mg total) by mouth every 6 (six) hours. (Patient not taking: Reported on 10/02/2018) 30 tablet 0 More than a month at Unknown time  . ondansetron (ZOFRAN) 4 MG tablet Take 8 mg by mouth 2 (two) times daily.   Not Taking  . Prenatal Vit-Fe Fumarate-FA (MULTIVITAMIN-PRENATAL) 27-0.8 MG TABS tablet Take 1 tablet by mouth daily at 12 noon.   Taking  . promethazine (PHENERGAN) 25 MG tablet Take 0.5-1 tablets (12.5-25 mg total) by mouth every 6 (six) hours as needed. 30 tablet 0 Taking  . terconazole (TERAZOL 7) 0.4 % vaginal cream terconazole 0.4 % vaginal cream  Insert 1 applicatorful every day by vaginal route for 7 days.   Not Taking    I have reviewed patient's Past Medical Hx, Surgical Hx, Family Hx, Social Hx, medications and allergies.   ROS:  Review of Systems  Constitutional: Negative.   HENT: Negative.   Eyes: Negative.   Respiratory: Negative.   Cardiovascular: Negative.   Gastrointestinal: Positive for abdominal pain.  Endocrine: Negative.   Genitourinary: Negative.   Neurological: Negative.   Psychiatric/Behavioral: Negative.     Physical Exam   Patient Vitals for the past 24 hrs:  BP Temp Temp src Pulse Resp SpO2 Height Weight  12/21/18 1445 117/70 - - 93 - 100 % - -  12/21/18 1430 116/61 - - 90 - 99 % - -  12/21/18 1401 130/73 - - 99 - - - -  12/21/18 1345 (!) 138/100 - - 94 - 99 % - -  12/21/18 1331 (!) 130/94 - - 96 - - -  -  12/21/18 1316 (!) 142/99 - - 100 - - - -  12/21/18 1300 140/90 - - 94 - 98 % - -  12/21/18 1245 (!) 144/90 - - (!) 106 - 97 % - -  12/21/18 1230 140/89 - - - - 96 % - -  12/21/18  1225 (!) 149/86 - - (!) 103 - 96 % - -  12/21/18 1155 (!) 149/85 - - (!) 118 - 97 % - -  12/21/18 1127 (!) 145/91 97.7 F (36.5 C) Oral (!) 111 20 99 % - -  12/21/18 1124 - - - - - - 5' 8.5" (1.74 m) (!) 149.4 kg   Constitutional: Well-developed, well-nourished female in no acute distress.  Cardiovascular: normal rate Respiratory: normal effort GI: Abd soft, non-tender, gravid appropriate for gestational age. Pos BS x 4 MS: Extremities nontender, no edema, normal ROM Neurologic: Alert and oriented x 4.  GU: Neg CVAT.  Pelvic: NEFG, physiologic discharge, no blood, cervix clean. Pelvic adequate for labor. No CMT  Dilation: Closed Effacement (%): Thick Cervical Position: Posterior Station: Ballotable  NST: FHR baseline 140 bpm, Variability: moderate, Accelerations:present, Decelerations:  Absent= Cat 1/Reactive UC:   none, irritable uterus, palpated one cxt with mild intensity, pt tolerated well, no grimacing.      Labs: Results for orders placed or performed during the hospital encounter of 12/21/18 (from the past 24 hour(s))  Urinalysis, Routine w reflex microscopic     Status: Abnormal   Collection Time: 12/21/18 11:49 AM  Result Value Ref Range   Color, Urine YELLOW YELLOW   APPearance CLEAR CLEAR   Specific Gravity, Urine 1.009 1.005 - 1.030   pH 6.0 5.0 - 8.0   Glucose, UA NEGATIVE NEGATIVE mg/dL   Hgb urine dipstick SMALL (A) NEGATIVE   Bilirubin Urine NEGATIVE NEGATIVE   Ketones, ur NEGATIVE NEGATIVE mg/dL   Protein, ur NEGATIVE NEGATIVE mg/dL   Nitrite NEGATIVE NEGATIVE   Leukocytes,Ua TRACE (A) NEGATIVE   RBC / HPF 0-5 0 - 5 RBC/hpf   WBC, UA 0-5 0 - 5 WBC/hpf   Bacteria, UA RARE (A) NONE SEEN   Squamous Epithelial / LPF 0-5 0 - 5   Mucus PRESENT   Protein / creatinine ratio, urine      Status: Abnormal   Collection Time: 12/21/18 11:49 AM  Result Value Ref Range   Creatinine, Urine 78.41 mg/dL   Total Protein, Urine 13 mg/dL   Protein Creatinine Ratio 0.17 (H) 0.00 - 0.15 mg/mg[Cre]  Comprehensive metabolic panel     Status: Abnormal   Collection Time: 12/21/18  1:15 PM  Result Value Ref Range   Sodium 135 135 - 145 mmol/L   Potassium 3.6 3.5 - 5.1 mmol/L   Chloride 105 98 - 111 mmol/L   CO2 19 (L) 22 - 32 mmol/L   Glucose, Bld 105 (H) 70 - 99 mg/dL   BUN <5 (L) 6 - 20 mg/dL   Creatinine, Ser 1.610.53 0.44 - 1.00 mg/dL   Calcium 9.3 8.9 - 09.610.3 mg/dL   Total Protein 6.9 6.5 - 8.1 g/dL   Albumin 2.8 (L) 3.5 - 5.0 g/dL   AST 14 (L) 15 - 41 U/L   ALT 12 0 - 44 U/L   Alkaline Phosphatase 115 38 - 126 U/L   Total Bilirubin 0.3 0.3 - 1.2 mg/dL   GFR calc non Af Amer >60 >60 mL/min   GFR calc Af Amer >60 >60 mL/min   Anion gap 11 5 - 15  CBC with Differential/Platelet     Status: Abnormal   Collection Time: 12/21/18  1:15 PM  Result Value Ref Range   WBC 6.4 4.0 - 10.5 K/uL   RBC 3.94 3.87 - 5.11 MIL/uL   Hemoglobin 11.0 (L) 12.0 - 15.0 g/dL   HCT 04.533.1 (L) 40.936.0 -  46.0 %   MCV 84.0 80.0 - 100.0 fL   MCH 27.9 26.0 - 34.0 pg   MCHC 33.2 30.0 - 36.0 g/dL   RDW 16.1 09.6 - 04.5 %   Platelets 320 150 - 400 K/uL   nRBC 0.0 0.0 - 0.2 %   Neutrophils Relative % 70 %   Neutro Abs 4.5 1.7 - 7.7 K/uL   Lymphocytes Relative 20 %   Lymphs Abs 1.3 0.7 - 4.0 K/uL   Monocytes Relative 7 %   Monocytes Absolute 0.5 0.1 - 1.0 K/uL   Eosinophils Relative 3 %   Eosinophils Absolute 0.2 0.0 - 0.5 K/uL   Basophils Relative 0 %   Basophils Absolute 0.0 0.0 - 0.1 K/uL   Immature Granulocytes 0 %   Abs Immature Granulocytes 0.02 0.00 - 0.07 K/uL  Uric acid     Status: None   Collection Time: 12/21/18  1:15 PM  Result Value Ref Range   Uric Acid, Serum 3.4 2.5 - 7.1 mg/dL  Lactate dehydrogenase     Status: None   Collection Time: 12/21/18  1:15 PM  Result Value Ref Range    LDH 111 98 - 192 U/L    Imaging:  Ct Angio Chest Pe W Or Wo Contrast  Result Date: 12/10/2018 CLINICAL DATA:  33 y/o F; chest pain. History of pulmonary embolus, lupus, [redacted] weeks gestation, tachycardia. EXAM: CT ANGIOGRAPHY CHEST WITH CONTRAST TECHNIQUE: Multidetector CT imaging of the chest was performed using the standard protocol during bolus administration of intravenous contrast. Multiplanar CT image reconstructions and MIPs were obtained to evaluate the vascular anatomy. CONTRAST:  80 cc Omnipaque 350 COMPARISON:  04/17/2019 CT angiogram of the chest. FINDINGS: Cardiovascular: Respiratory motion artifact. Satisfactory opacification of the pulmonary arteries. No evidence of central, lobar, or proximal segmental pulmonary embolism. Suboptimal assessment of downstream pulmonary arteries. Normal heart size. No pericardial effusion. Mediastinum/Nodes: No enlarged mediastinal, hilar, or axillary lymph nodes. Thyroid gland, trachea, and esophagus demonstrate no significant findings. Lungs/Pleura: Mild air trapping in the lung bases. No consolidation. No pleural effusion or pneumothorax. Upper Abdomen: No acute abnormality. Musculoskeletal: No chest wall abnormality. No acute or significant osseous findings. Review of the MIP images confirms the above findings. IMPRESSION: 1. Respiratory motion artifact. No central, lobar, or proximal segmental pulmonary embolus identified. Suboptimal assessment of downstream pulmonary arteries. 2. Mild air trapping in lower lobes compatible with small airways disease. No focal consolidation. Electronically Signed   By: Mitzi Hansen M.D.   On: 12/10/2018 23:32   [place] [facility] Ob Follow Up  Result Date: 12/19/2018 ----------------------------------------------------------------------  OBSTETRICS REPORT                       (Signed Final 12/19/2018 10:12 pm) ---------------------------------------------------------------------- Patient Info  ID #:       [id number]                           D.O.B.:  [birth year] (32 yrs)  Name:       Hayley Webb                 Visit Date: 12/19/2018 02:29 pm ---------------------------------------------------------------------- Performed By  Performed By:     Rennie Plowman          Ref. Address:      [facility]  Obstetrics &                                                              Gynecology                                                              [address]                                                              [address]                                                              [place], [place]                                                              [address]  Attending:        Lin Landsman      [facility]                                        [facility]  Referred By:      Kirkland Hun MD ---------------------------------------------------------------------- Orders   #  Description                          Code         Ordered By   1  [place] [facility] OB FOLLOW UP                  19147.82     Bettey Costa  BOOKER  ----------------------------------------------------------------------   #  Order #                    Accession #                 Episode #   1  [id number]                  [id number]                  [id number]  ---------------------------------------------------------------------- Indications   Encounter for antenatal screening for          Z36.3   malformations(insufficient fetal DNA x2 Nips,   AFP neg)   Maternal morbid obesity                        O99.210 E66.01   Systemic lupus complicating pregnancy,         O26.892, M32.9   second trimester   Medical complication of pregnancy              O26.90   (recurrent PE, on Lovenox)   Hypertension -  Chronic/Pre-existing            O10.019   (labetalol)   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 Cord Insertion:       Not well visualized  Amniotic Fluid  AFI FV:      Within normal limits  AFI Sum(cm)     %Tile       Largest Pocket(cm)  12.03           32          4.64  RUQ(cm)       RLQ(cm)       LUQ(cm)        LLQ(cm)  4.64          2.85          2.99           1.55 ---------------------------------------------------------------------- Biometry  BPD:      85.1  mm     G. Age:  34w 2d         87  %    CI:        78.27   %    70 - 86  FL/HC:       20.3  %    19.9 - 21.5  HC:      304.3  mm     G. Age:  33w 6d         47  %    HC/AC:       1.11       0.96 - 1.11  AC:      274.6  mm     G. Age:  31w 4d         22  %    FL/BPD:      72.5  %    71 - 87  FL:       61.7  mm     G. Age:  32w 0d         23  %    FL/AC:       22.5  %    20 - 24  HUM:      55.6  mm     G. Age:  32w 3d         50  %  LV:        5.6  mm  Est. FW:    1909   gm     4 lb 3 oz     49  % ---------------------------------------------------------------------- OB History  Gravidity:    3         Term:   1        Prem:   0        SAB:   1  TOP:          0       Ectopic:  0        Living: 1 ---------------------------------------------------------------------- Gestational Age  LMP:           35w 0d        Date:  04/18/18                 EDD:   01/23/19  U/S  Today:     33w 0d                                        EDD:   02/06/19  Best:          32w 4d     Det. By:  Marcella Dubs         EDD:   02/09/19                                      (06/26/18) ---------------------------------------------------------------------- Anatomy  Cranium:               Appears normal         LVOT:                   Appears normal  Cavum:                 Appears normal         Aortic Arch:            Previously seen  Ventricles:            Appears normal         Ductal Arch:  Previously seen  Choroid Plexus:        Previously seen        Diaphragm:              Appears normal  Cerebellum:            Previously seen        Stomach:                Appears normal, left                                                                        sided  Posterior Fossa:       Previously seen        Abdomen:                Appears normal  Nuchal Fold:           Not applicable (>20    Abdominal Wall:         Appears nml (cord                         wks GA)                                        insert, abd wall)  Face:                  Appears normal         Cord Vessels:           Appears normal (3                         (orbits and profile)                           vessel cord)  Lips:                  Previously seen        Kidneys:                Appear normal  Palate:                Previously seen        Bladder:                Appears normal  Thoracic:              Appears normal         Spine:                  Previously seen  Heart:                 Appears normal         Upper Extremities:      Previously seen                         (4CH, axis, and  situs)  RVOT:                  Appears normal         Lower Extremities:      Previously seen  Other:  Normal genaitalia previously visualized. Nasal bone previously seen.          Heels and 5th digit previously visualized. ---------------------------------------------------------------------- Cervix Uterus  Adnexa  Cervix  Not visualized (advanced GA >24wks)  Left Ovary  Within normal limits.  Right Ovary  Within normal limits.  Adnexa  No abnormality visualized. ---------------------------------------------------------------------- Impression  Normal interval growth  SLE  Chronic hypertension on meds ---------------------------------------------------------------------- Recommendations  Follow up growth in 4 weeks.  Initiate weekly testing. ----------------------------------------------------------------------               Lin Landsman, MD Electronically Signed Final Report   12/19/2018 10:12 pm ----------------------------------------------------------------------   MAU Course: Orders Placed This Encounter  Procedures  . OB Urine Culture  . Urinalysis, Routine w reflex microscopic  . Comprehensive metabolic panel  . CBC with Differential/Platelet  . Uric acid  . Lactate dehydrogenase  . Protein / creatinine ratio, urine  . Diet - low sodium heart healthy  . Increase activity slowly  . Call MD for:  . Call MD for:  temperature >100.4  . Call MD for:  persistant nausea and vomiting  . Call MD for:  severe uncontrolled pain  . Call MD for:  redness, tenderness, or signs of infection (pain, swelling, redness, odor or green/yellow discharge around incision site)  . Call MD for:  difficulty breathing, headache or visual disturbances  . Call MD for:  hives  . Call MD for:  persistant dizziness or light-headedness  . Call MD for:  extreme fatigue  . (HEART FAILURE PATIENTS) Call MD:  Anytime you have any of the following symptoms: 1) 3 pound weight gain in 24 hours or 5 pounds in 1 week 2) shortness of breath, with or without a dry hacking cough 3) swelling in the hands, feet or stomach 4) if you have to sleep on extra pillows at night in order to breathe.  . Discharge patient Discharge disposition: 01-Home or Self Care; Discharge patient date: 12/21/2018   No orders of the defined types were  placed in this encounter.   MDM: Chart review, history reviewed, labs reviewed, consulted Dr Mora Appl, PE, pt discharged.   Assessment: 1. Braxton Hick's contraction    Labs: CBC: HGB 11, HCT 33.1, Plat 320; CMP: ast 12, alt 14, creatinine 0.53; UA: small Hgb, trace leuko, rare bacterial, SG 1.009; uric acid: 3.4 , LDH: 111 , UC pending. Pt endorses having proteinuria due to her lupus already in this pregnancy, but have not seen where her protein exceeds the limit for dx. Therefore, PCR resulted 0.17. Bp here 130-140s/90s due to pt did not take her medications today for labetalol. Cat 1 strip, reactive.    Plan: Discharge home in stable condition.  Labor precautions and fetal kick counts BHC: Drink fluids, resting, take tylenol, pt declined prescription for atarax for anxiety with pain. Urine culture pending.  CHTN: Please take your home labetalol medication as prescribed as soon as you get home. Declined medication here. Report s/sx or BP > 150/100s Follow-up Information    [facility] Follow up.   Specialty:  Obstetrics and Gynecology Why:  Has F/U appointment this coming friday, please make sure you keep your appointment.  Contact information: [address]  [place] [address] [phone]          Allergies as of 12/21/2018      Reactions   Bactrim Anaphylaxis, Hives   Hydrocodone-acetaminophen Anaphylaxis   Nifedipine Swelling, Anaphylaxis   Swelling of tongue   Pineapple Itching   Prednisone Anaphylaxis, Hives   Has been on Dexamethasone without issue.   Shellfish Allergy Itching   Sulfamethoxazole-trimethoprim Other (See Comments)   Complications due to lupus   Nsaids Other (See Comments)   Has Lupus, reccommended to avoid NSAIDs   Other Itching   Shrimp:throat itching "can eat other shellfish"   Vicodin [hydrocodone-acetaminophen] Hives, Swelling   Can take Percocet and Tylenol without reaction.       Medication List    TAKE these medications   albuterol 108 (90 Base) MCG/ACT inhaler Commonly known as:  VENTOLIN HFA Inhale 2 puffs into the lungs every 6 (six) hours as needed for wheezing or shortness of breath.   albuterol (2.5 MG/3ML) 0.083% nebulizer solution Commonly known as:  PROVENTIL Take 2.5 mg by nebulization every 6 (six) hours as needed for wheezing or shortness of breath.   aspirin EC 81 MG tablet Take 1 tablet (81 mg total) by mouth daily.   Bonjesta 20-20 MG Tbcr Generic drug:  Doxylamine-Pyridoxine ER Bonjesta 20 mg-20 mg tablet,immediate and delay release  Take 1 tablet twice a day by oral route.   cholecalciferol 1000 units tablet Commonly known as:  VITAMIN D Take 1,000 Units by mouth daily.   enoxaparin 40 MG/0.4ML injection Commonly known as:  LOVENOX Inject 1.5 mLs (150 mg total) into the skin every 12 (twelve) hours.   hydroxychloroquine 200 MG tablet Commonly known as:  PLAQUENIL Take 100 mg by mouth 2 (two) times daily.   labetalol 200 MG tablet Commonly known as:  NORMODYNE Take 2 tablets (400 mg total) by mouth 2 (two) times daily.   metoCLOPramide 10 MG tablet Commonly known as:  REGLAN Take 1 tablet (10 mg total) by mouth every 6 (six) hours.   multivitamin-prenatal 27-0.8 MG Tabs tablet Take 1 tablet by mouth daily at 12 noon.   ondansetron 4 MG tablet Commonly known as:  ZOFRAN Take 8 mg by mouth 2 (two) times daily.   promethazine 25 MG tablet Commonly known as:  PHENERGAN Take 0.5-1 tablets (12.5-25 mg total) by mouth every 6 (six) hours as needed.   terconazole 0.4 % vaginal cream Commonly known as:  TERAZOL 7 terconazole 0.4 % vaginal cream  Insert 1 applicatorful every day by vaginal route for 7 days.       [facility] NP-C, [name] [place], [place] 12/21/2018 3:34 PM

## 2018-12-21 NOTE — Discharge Instructions (Signed)
° °Braxton Hicks Contractions °Contractions of the uterus can occur throughout pregnancy, but they are not always a sign that you are in labor. You may have practice contractions called Braxton Hicks contractions. These false labor contractions are sometimes confused with true labor. °What are Braxton Hicks contractions? °Braxton Hicks contractions are tightening movements that occur in the muscles of the uterus before labor. Unlike true labor contractions, these contractions do not result in opening (dilation) and thinning of the cervix. Toward the end of pregnancy (32-34 weeks), Braxton Hicks contractions can happen more often and may become stronger. These contractions are sometimes difficult to tell apart from true labor because they can be very uncomfortable. You should not feel embarrassed if you go to the hospital with false labor. °Sometimes, the only way to tell if you are in true labor is for your health care provider to look for changes in the cervix. The health care provider will do a physical exam and may monitor your contractions. If you are not in true labor, the exam should show that your cervix is not dilating and your water has not broken. °If there are no other health problems associated with your pregnancy, it is completely safe for you to be sent home with false labor. You may continue to have Braxton Hicks contractions until you go into true labor. °How to tell the difference between true labor and false labor °True labor °· Contractions last 30-70 seconds. °· Contractions become very regular. °· Discomfort is usually felt in the top of the uterus, and it spreads to the lower abdomen and low back. °· Contractions do not go away with walking. °· Contractions usually become more intense and increase in frequency. °· The cervix dilates and gets thinner. °False labor °· Contractions are usually shorter and not as strong as true labor contractions. °· Contractions are usually  irregular. °· Contractions are often felt in the front of the lower abdomen and in the groin. °· Contractions may go away when you walk around or change positions while lying down. °· Contractions get weaker and are shorter-lasting as time goes on. °· The cervix usually does not dilate or become thin. °Follow these instructions at home: ° °· Take over-the-counter and prescription medicines only as told by your health care provider. °· Keep up with your usual exercises and follow other instructions from your health care provider. °· Eat and drink lightly if you think you are going into labor. °· If Braxton Hicks contractions are making you uncomfortable: °? Change your position from lying down or resting to walking, or change from walking to resting. °? Sit and rest in a tub of warm water. °? Drink enough fluid to keep your urine pale yellow. Dehydration may cause these contractions. °? Do slow and deep breathing several times an hour. °· Keep all follow-up prenatal visits as told by your health care provider. This is important. °Contact a health care provider if: °· You have a fever. °· You have continuous pain in your abdomen. °Get help right away if: °· Your contractions become stronger, more regular, and closer together. °· You have fluid leaking or gushing from your vagina. °· You pass blood-tinged mucus (bloody show). °· You have bleeding from your vagina. °· You have low back pain that you never had before. °· You feel your baby’s head pushing down and causing pelvic pressure. °· Your baby is not moving inside you as much as it used to. °Summary °· Contractions that occur before labor   are called Braxton Hicks contractions, false labor, or practice contractions. °· Braxton Hicks contractions are usually shorter, weaker, farther apart, and less regular than true labor contractions. True labor contractions usually become progressively stronger and regular, and they become more frequent. °· Manage discomfort from  Braxton Hicks contractions by changing position, resting in a warm bath, drinking plenty of water, or practicing deep breathing. °This information is not intended to replace advice given to you by your health care provider. Make sure you discuss any questions you have with your health care provider. °Document Released: 11/23/2016 Document Revised: 04/24/2017 Document Reviewed: 11/23/2016 °Elsevier Interactive Patient Education © 2019 [employer] ° ° °Preterm Labor and Birth Information ° °The normal length of a pregnancy is 39-41 weeks. Preterm labor is when labor starts before 37 completed weeks of pregnancy. °What are the risk factors for preterm labor? °Preterm labor is more likely to occur in women who: °· Have certain infections during pregnancy such as a bladder infection, sexually transmitted infection, or infection inside the uterus (chorioamnionitis). °· Have a shorter-than-normal cervix. °· Have gone into preterm labor before. °· Have had surgery on their cervix. °· Are younger than age 17 or older than age 35. °· Are African American. °· Are pregnant with twins or multiple babies (multiple gestation). °· Take street drugs or smoke while pregnant. °· Do not gain enough weight while pregnant. °· Became pregnant shortly after having been pregnant. °What are the symptoms of preterm labor? °Symptoms of preterm labor include: °· Cramps similar to those that can happen during a menstrual period. The cramps may happen with diarrhea. °· Pain in the abdomen or lower back. °· Regular uterine contractions that may feel like tightening of the abdomen. °· A feeling of increased pressure in the pelvis. °· Increased watery or bloody mucus discharge from the vagina. °· Water breaking (ruptured amniotic sac). °Why is it important to recognize signs of preterm labor? °It is important to recognize signs of preterm labor because babies who are born prematurely may not be fully developed. This can put them at an increased  risk for: °· Long-term (chronic) heart and lung problems. °· Difficulty immediately after birth with regulating body systems, including blood sugar, body temperature, heart rate, and breathing rate. °· Bleeding in the brain. °· Cerebral palsy. °· Learning difficulties. °· Death. °These risks are highest for babies who are born before 34 weeks of pregnancy. °How is preterm labor treated? °Treatment depends on the length of your pregnancy, your condition, and the health of your baby. It may involve: °· Having a stitch (suture) placed in your cervix to prevent your cervix from opening too early (cerclage). °· Taking or being given medicines, such as: °? Hormone medicines. These may be given early in pregnancy to help support the pregnancy. °? Medicine to stop contractions. °? Medicines to help mature the baby’s lungs. These may be prescribed if the risk of delivery is high. °? Medicines to prevent your baby from developing cerebral palsy. °If the labor happens before 34 weeks of pregnancy, you may need to stay in the hospital. °What should I do if I think I am in preterm labor? °If you think that you are going into preterm labor, call your health care provider right away. °How can I prevent preterm labor in future pregnancies? °To increase your chance of having a full-term pregnancy: °· Do not use any tobacco products, such as cigarettes, chewing tobacco, and e-cigarettes. If you need help quitting, ask your health care   provider. °· Do not use street drugs or medicines that have not been prescribed to you during your pregnancy. °· Talk with your health care provider before taking any herbal supplements, even if you have been taking them regularly. °· Make sure you gain a healthy amount of weight during your pregnancy. °· Watch for infection. If you think that you might have an infection, get it checked right away. °· Make sure to tell your health care provider if you have gone into preterm labor before. °This  information is not intended to replace advice given to you by your health care provider. Make sure you discuss any questions you have with your health care provider. °Document Released: 09/30/2003 Document Revised: 12/21/2015 Document Reviewed: 12/01/2015 °Elsevier Interactive Patient Education © 2019 [employer] Inc. ° °

## 2018-12-22 ENCOUNTER — Inpatient Hospital Stay (EMERGENCY_DEPARTMENT_HOSPITAL)
Admission: AD | Admit: 2018-12-22 | Discharge: 2018-12-22 | Disposition: A | Discharge status: Home / Self Care | Payer: 59 | Source: Home / Self Care | Attending: Obstetrics and Gynecology | Admitting: Obstetrics and Gynecology

## 2018-12-22 ENCOUNTER — Other Ambulatory Visit: Payer: Self-pay

## 2018-12-22 ENCOUNTER — Encounter (HOSPITAL_COMMUNITY): Payer: Self-pay

## 2018-12-22 DIAGNOSIS — J45909 Unspecified asthma, uncomplicated: Secondary | ICD-10-CM | POA: Insufficient documentation

## 2018-12-22 DIAGNOSIS — Z885 Allergy status to narcotic agent status: Secondary | ICD-10-CM | POA: Insufficient documentation

## 2018-12-22 DIAGNOSIS — O4703 False labor before 37 completed weeks of gestation, third trimester: Secondary | ICD-10-CM

## 2018-12-22 DIAGNOSIS — I10 Essential (primary) hypertension: Secondary | ICD-10-CM

## 2018-12-22 DIAGNOSIS — Z886 Allergy status to analgesic agent status: Secondary | ICD-10-CM | POA: Insufficient documentation

## 2018-12-22 DIAGNOSIS — Z800 Family history of malignant neoplasm of digestive organs: Secondary | ICD-10-CM | POA: Insufficient documentation

## 2018-12-22 DIAGNOSIS — Z8042 Family history of malignant neoplasm of prostate: Secondary | ICD-10-CM | POA: Insufficient documentation

## 2018-12-22 DIAGNOSIS — Z3689 Encounter for other specified antenatal screening: Secondary | ICD-10-CM | POA: Insufficient documentation

## 2018-12-22 DIAGNOSIS — Z888 Allergy status to other drugs, medicaments and biological substances status: Secondary | ICD-10-CM | POA: Insufficient documentation

## 2018-12-22 DIAGNOSIS — Z3A33 33 weeks gestation of pregnancy: Secondary | ICD-10-CM | POA: Insufficient documentation

## 2018-12-22 DIAGNOSIS — O99513 Diseases of the respiratory system complicating pregnancy, third trimester: Secondary | ICD-10-CM | POA: Insufficient documentation

## 2018-12-22 DIAGNOSIS — Z8249 Family history of ischemic heart disease and other diseases of the circulatory system: Secondary | ICD-10-CM | POA: Insufficient documentation

## 2018-12-22 DIAGNOSIS — O479 False labor, unspecified: Secondary | ICD-10-CM

## 2018-12-22 DIAGNOSIS — Z833 Family history of diabetes mellitus: Secondary | ICD-10-CM | POA: Insufficient documentation

## 2018-12-22 DIAGNOSIS — O163 Unspecified maternal hypertension, third trimester: Secondary | ICD-10-CM | POA: Insufficient documentation

## 2018-12-22 DIAGNOSIS — Z881 Allergy status to other antibiotic agents status: Secondary | ICD-10-CM | POA: Insufficient documentation

## 2018-12-22 DIAGNOSIS — Z91018 Allergy to other foods: Secondary | ICD-10-CM | POA: Insufficient documentation

## 2018-12-22 DIAGNOSIS — Z825 Family history of asthma and other chronic lower respiratory diseases: Secondary | ICD-10-CM | POA: Insufficient documentation

## 2018-12-22 DIAGNOSIS — Z91013 Allergy to seafood: Secondary | ICD-10-CM | POA: Insufficient documentation

## 2018-12-22 DIAGNOSIS — Z86711 Personal history of pulmonary embolism: Secondary | ICD-10-CM | POA: Insufficient documentation

## 2018-12-22 DIAGNOSIS — Z79899 Other long term (current) drug therapy: Secondary | ICD-10-CM | POA: Insufficient documentation

## 2018-12-22 DIAGNOSIS — Z803 Family history of malignant neoplasm of breast: Secondary | ICD-10-CM | POA: Insufficient documentation

## 2018-12-22 LAB — CULTURE, OB URINE: Culture: NO GROWTH

## 2018-12-22 MED ORDER — LABETALOL HCL 100 MG PO TABS
200.0000 mg | ORAL_TABLET | Freq: Once | ORAL | Status: AC
  Administered 2018-12-22: 200 mg via ORAL
  Filled 2018-12-22: qty 2

## 2018-12-22 MED ORDER — OXYCODONE-ACETAMINOPHEN 5-325 MG PO TABS
1.0000 | ORAL_TABLET | Freq: Once | ORAL | Status: AC
  Administered 2018-12-22: 1 via ORAL
  Filled 2018-12-22: qty 1

## 2018-12-22 MED ORDER — PROMETHAZINE HCL 25 MG/ML IJ SOLN
25.0000 mg | Freq: Once | INTRAMUSCULAR | Status: AC
  Administered 2018-12-22: 25 mg via INTRAVENOUS
  Filled 2018-12-22: qty 1

## 2018-12-22 MED ORDER — LACTATED RINGERS IV BOLUS
500.0000 mL | Freq: Once | INTRAVENOUS | Status: AC
  Administered 2018-12-22: 500 mL via INTRAVENOUS

## 2018-12-22 MED ORDER — DIPHENHYDRAMINE HCL 50 MG/ML IJ SOLN
25.0000 mg | Freq: Once | INTRAMUSCULAR | Status: AC
  Administered 2018-12-22: 25 mg via INTRAVENOUS
  Filled 2018-12-22: qty 1

## 2018-12-22 MED ORDER — CYCLOBENZAPRINE HCL 10 MG PO TABS
10.0000 mg | ORAL_TABLET | Freq: Once | ORAL | Status: AC
  Administered 2018-12-22: 10 mg via ORAL
  Filled 2018-12-22: qty 1

## 2018-12-22 NOTE — Discharge Instructions (Signed)
Braxton Hicks Contractions Contractions of the uterus can occur throughout pregnancy, but they are not always a sign that you are in labor. You may have practice contractions called Braxton Hicks contractions. These false labor contractions are sometimes confused with true labor. What are Braxton Hicks contractions? Braxton Hicks contractions are tightening movements that occur in the muscles of the uterus before labor. Unlike true labor contractions, these contractions do not result in opening (dilation) and thinning of the cervix. Toward the end of pregnancy (32-34 weeks), Braxton Hicks contractions can happen more often and may become stronger. These contractions are sometimes difficult to tell apart from true labor because they can be very uncomfortable. You should not feel embarrassed if you go to the hospital with false labor. Sometimes, the only way to tell if you are in true labor is for your health care provider to look for changes in the cervix. The health care provider will do a physical exam and may monitor your contractions. If you are not in true labor, the exam should show that your cervix is not dilating and your water has not broken. If there are no other health problems associated with your pregnancy, it is completely safe for you to be sent home with false labor. You may continue to have Braxton Hicks contractions until you go into true labor. How to tell the difference between true labor and false labor True labor  Contractions last 30-70 seconds.  Contractions become very regular.  Discomfort is usually felt in the top of the uterus, and it spreads to the lower abdomen and low back.  Contractions do not go away with walking.  Contractions usually become more intense and increase in frequency.  The cervix dilates and gets thinner. False labor  Contractions are usually shorter and not as strong as true labor contractions.  Contractions are usually irregular.  Contractions  are often felt in the front of the lower abdomen and in the groin.  Contractions may go away when you walk around or change positions while lying down.  Contractions get weaker and are shorter-lasting as time goes on.  The cervix usually does not dilate or become thin. Follow these instructions at home:   Take over-the-counter and prescription medicines only as told by your health care provider.  Keep up with your usual exercises and follow other instructions from your health care provider.  Eat and drink lightly if you think you are going into labor.  If Braxton Hicks contractions are making you uncomfortable: ? Change your position from lying down or resting to walking, or change from walking to resting. ? Sit and rest in a tub of warm water. ? Drink enough fluid to keep your urine pale yellow. Dehydration may cause these contractions. ? Do slow and deep breathing several times an hour.  Keep all follow-up prenatal visits as told by your health care provider. This is important. Contact a health care provider if:  You have a fever.  You have continuous pain in your abdomen. Get help right away if:  Your contractions become stronger, more regular, and closer together.  You have fluid leaking or gushing from your vagina.  You pass blood-tinged mucus (bloody show).  You have bleeding from your vagina.  You have low back pain that you never had before.  You feel your baby's head pushing down and causing pelvic pressure.  Your baby is not moving inside you as much as it used to. Summary  Contractions that occur before labor are   called Braxton Hicks contractions, false labor, or practice contractions.  Braxton Hicks contractions are usually shorter, weaker, farther apart, and less regular than true labor contractions. True labor contractions usually become progressively stronger and regular, and they become more frequent.  Manage discomfort from Braxton Hicks contractions  by changing position, resting in a warm bath, drinking plenty of water, or practicing deep breathing. This information is not intended to replace advice given to you by your health care provider. Make sure you discuss any questions you have with your health care provider. Document Released: 11/23/2016 Document Revised: 04/24/2017 Document Reviewed: 11/23/2016 Elsevier Interactive Patient Education  2019 [employer] Inc.  

## 2018-12-22 NOTE — MAU Note (Signed)
Pt taking labetalol for BP, did not take her dose this morning

## 2018-12-22 NOTE — MAU Provider Note (Signed)
History     CSN: [id number]  Arrival date and time: 12/22/18 7829   First Provider Initiated Contact with Patient 12/22/18 [phone]     Chief Complaint  Patient presents with  . Contractions   HPI Hayley Webb is a 33 y.o. G3P1011 at [redacted]w[redacted]d who presents to MAU with chief complaint of preterm contractions. This is a recurring problem for which patient was seen in MAU yesterday and discharged at 2pm. Patient states her contractions are unchanged since that time. She rates her lower abdominal contraction pain as 8-9/10. She took Flexeril yesterday afternoon and Percocet last night but did not experience relief. She has not taken medication or tried any other treatments for her pain today.  Patient's pregnancy includes the complication of Chronic Hypertension. She has been prescribed 200 mg Labetalol BID but has not taken her medication today. She denies headache, visual disturbances, weakness or syncope and upper abdominal or RUQ pain.  She denies vaginal bleeding, leaking of fluid, decreased fetal movement, fever, falls, or recent illness.    OB History    Gravida  3   Para  1   Term  1   Preterm      AB  1   Living  1     SAB  1   TAB      Ectopic      Multiple      Live Births  1           Past Medical History:  Diagnosis Date  . Anxiety    doing good now  . Asthma   . Headache(784.0)   . HSV infection   . Hypertension   . Infection    UTI  . Lupus (HCC)    dx age 65  . Pulmonary embolism (HCC) 06/4/82011  . 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 ADENOIDECTOMY    . TONSILLECTOMY AND ADENOIDECTOMY  1998  . WISDOM TOOTH EXTRACTION      Family History  Problem Relation Age of Onset  . Diabetes Maternal Aunt   . Cancer Maternal Grandmother 72       COLON CA  . Diabetes Maternal Grandmother   . Hypertension Maternal Grandfather   . Cancer Maternal Grandfather    prostate  . Asthma Mother   . Asthma Brother   . Cancer Paternal Grandmother        breast  . Lupus Paternal Grandmother   . Lupus Paternal Aunt   . Stroke Paternal Aunt   . Anesthesia problems Neg Hx   . Hypotension Neg Hx   . Malignant hyperthermia Neg Hx   . Pseudochol deficiency Neg Hx     Social History   Tobacco Use  . Smoking status: Never Smoker  . Smokeless tobacco: Never Used  Substance Use Topics  . Alcohol use: No  . Drug use: No    Allergies:  Allergies  Allergen Reactions  . Bactrim Anaphylaxis and Hives  . Hydrocodone-Acetaminophen Anaphylaxis  . Nifedipine Swelling and Anaphylaxis    Swelling of tongue  . Pineapple Itching  . Prednisone Anaphylaxis and Hives    Has been on Dexamethasone without issue.  . Shellfish Allergy Itching  . Sulfamethoxazole-Trimethoprim Other (See Comments)    Complications due to lupus  . Nsaids Other (See Comments)    Has Lupus, reccommended to avoid NSAIDs  . Other Itching    Shrimp:throat itching "can eat other shellfish"  .  Vicodin [Hydrocodone-Acetaminophen] Hives and Swelling    Can take Percocet and Tylenol without reaction.    Medications Prior to Admission  Medication Sig Dispense Refill Last Dose  . albuterol (PROVENTIL HFA;VENTOLIN HFA) 108 (90 Base) MCG/ACT inhaler Inhale 2 puffs into the lungs every 6 (six) hours as needed for wheezing or shortness of breath.   Taking  . albuterol (PROVENTIL) (2.5 MG/3ML) 0.083% nebulizer solution Take 2.5 mg by nebulization every 6 (six) hours as needed for wheezing or shortness of breath.   Taking  . aspirin EC 81 MG tablet Take 1 tablet (81 mg total) by mouth daily. (Patient not taking: Reported on 09/04/2018) 90 tablet 3 Not Taking  . cholecalciferol (VITAMIN D) 1000 units tablet Take 1,000 Units by mouth daily.   Taking  . Doxylamine-Pyridoxine ER (BONJESTA) 20-20 MG TBCR Bonjesta 20 mg-20 mg tablet,immediate and delay release  Take 1 tablet twice a day by oral route.    Not Taking  . enoxaparin (LOVENOX) 40 MG/0.4ML injection Inject 1.5 mLs (150 mg total) into the skin every 12 (twelve) hours.   Taking  . hydroxychloroquine (PLAQUENIL) 200 MG tablet Take 100 mg by mouth 2 (two) times daily.   Taking  . labetalol (NORMODYNE) 200 MG tablet Take 2 tablets (400 mg total) by mouth 2 (two) times daily. 60 tablet 5 12/20/2018 at Unknown time  . metoCLOPramide (REGLAN) 10 MG tablet Take 1 tablet (10 mg total) by mouth every 6 (six) hours. (Patient not taking: Reported on 10/02/2018) 30 tablet 0 More than a month at Unknown time  . ondansetron (ZOFRAN) 4 MG tablet Take 8 mg by mouth 2 (two) times daily.   Not Taking  . Prenatal Vit-Fe Fumarate-FA (MULTIVITAMIN-PRENATAL) 27-0.8 MG TABS tablet Take 1 tablet by mouth daily at 12 noon.   Taking  . promethazine (PHENERGAN) 25 MG tablet Take 0.5-1 tablets (12.5-25 mg total) by mouth every 6 (six) hours as needed. 30 tablet 0 Taking  . terconazole (TERAZOL 7) 0.4 % vaginal cream terconazole 0.4 % vaginal cream  Insert 1 applicatorful every day by vaginal route for 7 days.   Not Taking    Review of Systems  Constitutional: Negative for chills, fatigue and fever.  Eyes: Negative for photophobia and visual disturbance.  Respiratory: Negative for shortness of breath.   Gastrointestinal: Positive for abdominal pain.  Genitourinary: Negative for vaginal bleeding, vaginal discharge and vaginal pain.  Musculoskeletal: Positive for back pain.  Neurological: Negative for syncope, weakness and headaches.  All other systems reviewed and are negative.  Physical Exam   Blood pressure (!) 154/95, pulse 93, temperature 97.6 F (36.4 C), temperature source Oral, resp. rate 20, last menstrual period 04/18/2018, SpO2 98 %.  Physical Exam  Nursing note and vitals reviewed. Constitutional: She appears well-developed and well-nourished.  Cardiovascular: Normal rate.  Respiratory: Effort normal.    MAU Course  Procedures  --Patient  allergic to Procardia --Labetalol 200 given at 0915. No severe range BPs, no severe symptoms, normal PEC labs at 2pm yesterday. Not reaccomplished today --Normotensive after daily Labetalol --Cervix remains closed --Patient's pain scored reduced but "sleepy" after medications given in MAU. Sleeping upon RN and [name] visits to bedside --Reactive tracing: baseline 135, mod variability, positive accels, no decels --Toco: ctx responsive ot bolus and PO pain meds  Patient Vitals for the past 24 hrs:  BP Temp Temp src Pulse Resp SpO2  12/22/18 1051 129/77 - - 93 - -  12/22/18 1002 139/67 - - (!) 102 - -  12/22/18 0946 (!) 148/103 - - 97 - -  12/22/18 0931 (!) 154/95 - - 93 - -  12/22/18 0916 (!) 151/101 - - (!) 101 - -  12/22/18 0901 140/88 - - 99 - 98 %  12/22/18 0846 (!) 145/90 - - (!) 102 - -  12/22/18 0835 (!) 142/107 - - (!) 114 - 98 %  12/22/18 0819 (!) 136/98 97.6 F (36.4 C) Oral (!) 103 20 99 %    Results for orders placed or performed during the hospital encounter of 12/21/18 (from the past 24 hour(s))  Urinalysis, Routine w reflex microscopic     Status: Abnormal   Collection Time: 12/21/18 11:49 AM  Result Value Ref Range   Color, Urine YELLOW YELLOW   APPearance CLEAR CLEAR   Specific Gravity, Urine 1.009 1.005 - 1.030   pH 6.0 5.0 - 8.0   Glucose, UA NEGATIVE NEGATIVE mg/dL   Hgb urine dipstick SMALL (A) NEGATIVE   Bilirubin Urine NEGATIVE NEGATIVE   Ketones, ur NEGATIVE NEGATIVE mg/dL   Protein, ur NEGATIVE NEGATIVE mg/dL   Nitrite NEGATIVE NEGATIVE   Leukocytes,Ua TRACE (A) NEGATIVE   RBC / HPF 0-5 0 - 5 RBC/hpf   WBC, UA 0-5 0 - 5 WBC/hpf   Bacteria, UA RARE (A) NONE SEEN   Squamous Epithelial / LPF 0-5 0 - 5   Mucus PRESENT   Protein / creatinine ratio, urine     Status: Abnormal   Collection Time: 12/21/18 11:49 AM  Result Value Ref Range   Creatinine, Urine 78.41 mg/dL   Total Protein, Urine 13 mg/dL   Protein Creatinine Ratio 0.17 (H) 0.00 - 0.15  mg/mg[Cre]  Comprehensive metabolic panel     Status: Abnormal   Collection Time: 12/21/18  1:15 PM  Result Value Ref Range   Sodium 135 135 - 145 mmol/L   Potassium 3.6 3.5 - 5.1 mmol/L   Chloride 105 98 - 111 mmol/L   CO2 19 (L) 22 - 32 mmol/L   Glucose, Bld 105 (H) 70 - 99 mg/dL   BUN <5 (L) 6 - 20 mg/dL   Creatinine, Ser 0.45 0.44 - 1.00 mg/dL   Calcium 9.3 8.9 - 40.9 mg/dL   Total Protein 6.9 6.5 - 8.1 g/dL   Albumin 2.8 (L) 3.5 - 5.0 g/dL   AST 14 (L) 15 - 41 U/L   ALT 12 0 - 44 U/L   Alkaline Phosphatase 115 38 - 126 U/L   Total Bilirubin 0.3 0.3 - 1.2 mg/dL   GFR calc non Af Amer >60 >60 mL/min   GFR calc Af Amer >60 >60 mL/min   Anion gap 11 5 - 15  CBC with Differential/Platelet     Status: Abnormal   Collection Time: 12/21/18  1:15 PM  Result Value Ref Range   WBC 6.4 4.0 - 10.5 K/uL   RBC 3.94 3.87 - 5.11 MIL/uL   Hemoglobin 11.0 (L) 12.0 - 15.0 g/dL   HCT 81.1 (L) 91.4 - 78.2 %   MCV 84.0 80.0 - 100.0 fL   MCH 27.9 26.0 - 34.0 pg   MCHC 33.2 30.0 - 36.0 g/dL   RDW 95.6 21.3 - 08.6 %   Platelets 320 150 - 400 K/uL   nRBC 0.0 0.0 - 0.2 %   Neutrophils Relative % 70 %   Neutro Abs 4.5 1.7 - 7.7 K/uL   Lymphocytes Relative 20 %   Lymphs Abs 1.3 0.7 - 4.0 K/uL   Monocytes Relative 7 %  Monocytes Absolute 0.5 0.1 - 1.0 K/uL   Eosinophils Relative 3 %   Eosinophils Absolute 0.2 0.0 - 0.5 K/uL   Basophils Relative 0 %   Basophils Absolute 0.0 0.0 - 0.1 K/uL   Immature Granulocytes 0 %   Abs Immature Granulocytes 0.02 0.00 - 0.07 K/uL  Uric acid     Status: None   Collection Time: 12/21/18  1:15 PM  Result Value Ref Range   Uric Acid, Serum 3.4 2.5 - 7.1 mg/dL  Lactate dehydrogenase     Status: None   Collection Time: 12/21/18  1:15 PM  Result Value Ref Range   LDH 111 98 - 192 U/L   Meds ordered this encounter  Medications  . labetalol (NORMODYNE) tablet 200 mg  . cyclobenzaprine (FLEXERIL) tablet 10 mg  . oxyCODONE-acetaminophen (PERCOCET/ROXICET)  5-325 MG per tablet 1 tablet    Has prescription from prenatal care provider, does not have allergy  . lactated ringers bolus 500 mL  . promethazine (PHENERGAN) injection 25 mg  . diphenhydrAMINE (BENADRYL) injection 25 mg   Assessment and Plan  --32 y.o. G3P1011 at [id number]  --Reactive tracing --Chronic Hypertension, controlled on prescribed meds --Normal PEC labs less than 24 hours ago --Braxton-Hicks ctx, cervix remains closed and posterior --Pain reduced with meds in MAU. Pt verbalizes satisfaction with pain score at discharge --Discharge home in stable condition  F/U: Pt has ob appt Friday 12/27/2018  Calvert CantorSamantha C Benny Deutschman, [name] 12/22/2018, 11:37 AM

## 2018-12-22 NOTE — MAU Note (Signed)
Pt reports ctx every 2-4 min since yesterday around 5pm.  Pt denies LOF or vag bleeding today.  Pt reports good fetal movement.  Pt reports taking flexeril around 4:30pm yesterday and 1 percocet around 10pm last night.  Pt reports no relief from meds.

## 2018-12-22 NOTE — MAU Note (Signed)
Serial BP stopped per verbal order by [facility]

## 2018-12-23 ENCOUNTER — Other Ambulatory Visit: Payer: Self-pay

## 2018-12-23 ENCOUNTER — Encounter (HOSPITAL_COMMUNITY): Payer: Self-pay

## 2018-12-23 ENCOUNTER — Inpatient Hospital Stay (HOSPITAL_COMMUNITY)
Admission: AD | Admit: 2018-12-23 | Discharge: 2018-12-26 | DRG: 832 | Disposition: A | Discharge status: Home / Self Care | Payer: 59 | Attending: Obstetrics & Gynecology | Admitting: Obstetrics & Gynecology

## 2018-12-23 DIAGNOSIS — O4703 False labor before 37 completed weeks of gestation, third trimester: Secondary | ICD-10-CM | POA: Diagnosis present

## 2018-12-23 DIAGNOSIS — F419 Anxiety disorder, unspecified: Secondary | ICD-10-CM | POA: Insufficient documentation

## 2018-12-23 DIAGNOSIS — F32A Depression, unspecified: Secondary | ICD-10-CM | POA: Insufficient documentation

## 2018-12-23 DIAGNOSIS — O4700 False labor before 37 completed weeks of gestation, unspecified trimester: Secondary | ICD-10-CM | POA: Diagnosis present

## 2018-12-23 DIAGNOSIS — F329 Major depressive disorder, single episode, unspecified: Secondary | ICD-10-CM | POA: Insufficient documentation

## 2018-12-23 DIAGNOSIS — O9989 Other specified diseases and conditions complicating pregnancy, childbirth and the puerperium: Secondary | ICD-10-CM | POA: Diagnosis present

## 2018-12-23 DIAGNOSIS — Z86711 Personal history of pulmonary embolism: Secondary | ICD-10-CM

## 2018-12-23 DIAGNOSIS — O10013 Pre-existing essential hypertension complicating pregnancy, third trimester: Secondary | ICD-10-CM | POA: Diagnosis present

## 2018-12-23 DIAGNOSIS — O98313 Other infections with a predominantly sexual mode of transmission complicating pregnancy, third trimester: Secondary | ICD-10-CM | POA: Diagnosis present

## 2018-12-23 DIAGNOSIS — Z3689 Encounter for other specified antenatal screening: Secondary | ICD-10-CM

## 2018-12-23 DIAGNOSIS — Z3A33 33 weeks gestation of pregnancy: Secondary | ICD-10-CM

## 2018-12-23 DIAGNOSIS — Z1159 Encounter for screening for other viral diseases: Secondary | ICD-10-CM

## 2018-12-23 DIAGNOSIS — E669 Obesity, unspecified: Secondary | ICD-10-CM | POA: Diagnosis present

## 2018-12-23 DIAGNOSIS — O99213 Obesity complicating pregnancy, third trimester: Secondary | ICD-10-CM | POA: Diagnosis present

## 2018-12-23 DIAGNOSIS — O479 False labor, unspecified: Secondary | ICD-10-CM

## 2018-12-23 DIAGNOSIS — M329 Systemic lupus erythematosus, unspecified: Secondary | ICD-10-CM | POA: Diagnosis present

## 2018-12-23 DIAGNOSIS — A6000 Herpesviral infection of urogenital system, unspecified: Secondary | ICD-10-CM | POA: Diagnosis present

## 2018-12-23 HISTORY — DX: False labor before 37 completed weeks of gestation, third trimester: O47.03

## 2018-12-23 HISTORY — DX: Anxiety disorder, unspecified: F41.9

## 2018-12-23 HISTORY — DX: Depression, unspecified: F32.A

## 2018-12-23 LAB — WET PREP, GENITAL
Clue Cells Wet Prep HPF POC: NONE SEEN
Sperm: NONE SEEN
Trich, Wet Prep: NONE SEEN
Yeast Wet Prep HPF POC: NONE SEEN

## 2018-12-23 LAB — OB RESULTS CONSOLE GBS: GBS: NEGATIVE

## 2018-12-23 LAB — URINALYSIS, ROUTINE W REFLEX MICROSCOPIC
Bilirubin Urine: NEGATIVE
Glucose, UA: NEGATIVE mg/dL
Ketones, ur: NEGATIVE mg/dL
Leukocytes,Ua: NEGATIVE
Nitrite: NEGATIVE
Protein, ur: 30 mg/dL — AB
Specific Gravity, Urine: 1.015 (ref 1.005–1.030)
pH: 7 (ref 5.0–8.0)

## 2018-12-23 LAB — CBC
HCT: 32.7 % — ABNORMAL LOW (ref 36.0–46.0)
Hemoglobin: 10.9 g/dL — ABNORMAL LOW (ref 12.0–15.0)
MCH: 28.5 pg (ref 26.0–34.0)
MCHC: 33.3 g/dL (ref 30.0–36.0)
MCV: 85.4 fL (ref 80.0–100.0)
Platelets: 326 10*3/uL (ref 150–400)
RBC: 3.83 MIL/uL — ABNORMAL LOW (ref 3.87–5.11)
RDW: 13.5 % (ref 11.5–15.5)
WBC: 7.3 10*3/uL (ref 4.0–10.5)
nRBC: 0 % (ref 0.0–0.2)

## 2018-12-23 LAB — CREATININE, SERUM
Creatinine, Ser: 0.59 mg/dL (ref 0.44–1.00)
GFR calc Af Amer: >60 mL/min (ref >60)
GFR calc non Af Amer: >60 mL/min (ref >60)

## 2018-12-23 LAB — TYPE AND SCREEN
ABO/RH(D): O POS
Antibody Screen: NEGATIVE

## 2018-12-23 LAB — ABO/RH: ABO/RH(D): O POS

## 2018-12-23 LAB — FETAL FIBRONECTIN: Fetal Fibronectin: NEGATIVE

## 2018-12-23 LAB — SARS CORONAVIRUS 2 BY RT PCR (HOSPITAL ORDER, PERFORMED IN ~~LOC~~ HOSPITAL LAB): SARS Coronavirus 2: NEGATIVE

## 2018-12-23 MED ORDER — BETAMETHASONE SOD PHOS & ACET 6 (3-3) MG/ML IJ SUSP
12.0000 mg | INTRAMUSCULAR | Status: AC
  Administered 2018-12-23 – 2018-12-24 (×2): 12 mg via INTRAMUSCULAR
  Filled 2018-12-23 (×2): qty 2

## 2018-12-23 MED ORDER — MAGNESIUM SULFATE 40 G IN LACTATED RINGERS - SIMPLE
2.0000 g/h | INTRAVENOUS | Status: AC
  Administered 2018-12-23 – 2018-12-24 (×2): 2 g/h via INTRAVENOUS
  Filled 2018-12-23 (×2): qty 500

## 2018-12-23 MED ORDER — LACTATED RINGERS IV BOLUS
1000.0000 mL | Freq: Once | INTRAVENOUS | Status: AC
  Administered 2018-12-23: 1000 mL via INTRAVENOUS

## 2018-12-23 MED ORDER — MAGNESIUM SULFATE BOLUS VIA INFUSION
4.0000 g | Freq: Once | INTRAVENOUS | Status: AC
  Administered 2018-12-23: 4 g via INTRAVENOUS
  Filled 2018-12-23: qty 500

## 2018-12-23 MED ORDER — PROMETHAZINE HCL 25 MG PO TABS: 12.5000 mg | ORAL_TABLET | Freq: Four times a day (QID) | ORAL | Status: DC | PRN

## 2018-12-23 MED ORDER — HYDROXYCHLOROQUINE SULFATE 200 MG PO TABS
100.0000 mg | ORAL_TABLET | Freq: Two times a day (BID) | ORAL | Status: DC
  Administered 2018-12-23 – 2018-12-25 (×4): 100 mg via ORAL
  Filled 2018-12-23 (×7): qty 0.5

## 2018-12-23 MED ORDER — ACETAMINOPHEN 500 MG PO TABS
1000.0000 mg | ORAL_TABLET | Freq: Four times a day (QID) | ORAL | Status: DC | PRN
  Administered 2018-12-24 – 2018-12-26 (×2): 1000 mg via ORAL
  Filled 2018-12-23 (×2): qty 2

## 2018-12-23 MED ORDER — DOCUSATE SODIUM 100 MG PO CAPS
100.0000 mg | ORAL_CAPSULE | Freq: Every day | ORAL | Status: DC
  Administered 2018-12-24 – 2018-12-26 (×3): 100 mg via ORAL
  Filled 2018-12-23 (×3): qty 1

## 2018-12-23 MED ORDER — ALBUTEROL SULFATE (2.5 MG/3ML) 0.083% IN NEBU: 2.5000 mg | INHALATION_SOLUTION | Freq: Four times a day (QID) | RESPIRATORY_TRACT | Status: DC | PRN

## 2018-12-23 MED ORDER — ALBUTEROL SULFATE (2.5 MG/3ML) 0.083% IN NEBU: 3.0000 mL | INHALATION_SOLUTION | Freq: Four times a day (QID) | RESPIRATORY_TRACT | Status: DC | PRN

## 2018-12-23 MED ORDER — OXYCODONE HCL 5 MG PO TABS
5.0000 mg | ORAL_TABLET | ORAL | Status: DC | PRN
  Administered 2018-12-23 – 2018-12-25 (×2): 5 mg via ORAL
  Filled 2018-12-23 (×2): qty 1

## 2018-12-23 MED ORDER — LABETALOL HCL 200 MG PO TABS
400.0000 mg | ORAL_TABLET | Freq: Two times a day (BID) | ORAL | Status: DC
  Administered 2018-12-23 – 2018-12-26 (×6): 400 mg via ORAL
  Filled 2018-12-23 (×6): qty 2

## 2018-12-23 MED ORDER — VALACYCLOVIR HCL 500 MG PO TABS
500.0000 mg | ORAL_TABLET | Freq: Two times a day (BID) | ORAL | Status: DC
  Administered 2018-12-23 – 2018-12-26 (×6): 500 mg via ORAL
  Filled 2018-12-23 (×6): qty 1

## 2018-12-23 MED ORDER — PRENATAL MULTIVITAMIN CH
1.0000 | ORAL_TABLET | Freq: Every day | ORAL | Status: DC
  Administered 2018-12-24 – 2018-12-25 (×2): 1 via ORAL
  Filled 2018-12-23 (×2): qty 1

## 2018-12-23 MED ORDER — ENOXAPARIN SODIUM 40 MG/0.4ML ~~LOC~~ SOLN
150.0000 mg | Freq: Two times a day (BID) | SUBCUTANEOUS | Status: DC
  Administered 2018-12-23: 150 mg via SUBCUTANEOUS
  Filled 2018-12-23 (×4): qty 1.6

## 2018-12-23 MED ORDER — FENTANYL CITRATE (PF) 100 MCG/2ML IJ SOLN
100.0000 ug | Freq: Once | INTRAMUSCULAR | Status: AC
  Administered 2018-12-23: 100 ug via INTRAVENOUS
  Filled 2018-12-23: qty 2

## 2018-12-23 MED ORDER — CALCIUM CARBONATE ANTACID 500 MG PO CHEW
2.0000 | CHEWABLE_TABLET | ORAL | Status: DC | PRN
  Administered 2018-12-24 – 2018-12-25 (×2): 400 mg via ORAL
  Filled 2018-12-23 (×2): qty 2

## 2018-12-23 MED ORDER — ZOLPIDEM TARTRATE 5 MG PO TABS
5.0000 mg | ORAL_TABLET | Freq: Every evening | ORAL | Status: DC | PRN
  Administered 2018-12-24 – 2018-12-25 (×2): 5 mg via ORAL
  Filled 2018-12-23 (×2): qty 1

## 2018-12-23 MED ORDER — LACTATED RINGERS IV SOLN
INTRAVENOUS | Status: DC
  Administered 2018-12-23 – 2018-12-24 (×4): via INTRAVENOUS

## 2018-12-23 NOTE — MAU Provider Note (Signed)
History     CSN: [id number]  Arrival date and time: 12/23/18 1305   First Provider Initiated Contact with Patient 12/23/18 1417      Chief Complaint  Patient presents with  . Contractions   [name] is a 33 y.o. G3P1011 at [redacted]w[redacted]d who presents to MAU for ctx q2-32min. This has been an on-going issue for this pt. Pt was seen in MAU on 12/21/2018 and 12/22/2018 for the same concern. Pt was also seen for this same concern on 12/05/2018. Pt reports her goal today is to "get something to make the ctx stop." Pt denies intercourse for the past several months. Pt reports nothing in the vagina in the last 24hrs. Last CE around 0830 yesterday. Pt is allergic to Procardia and has tried Flexeril and Percocet at home for pain with no relief. Pt reports being well hydrated, but urine today in MAU is dark in color.  Onset: Saturday after leaving the hospital Location: both sides of pelvis, radiating to front Duration: 2-3days Character: ctx-like pain, period-like cramp, starts on the sides and wraps around to front with feeling of tightness in abdomen Aggravating/Associated: none/ Relieving: none Treatment: warm water in bath, Flexeril/Percocet did not work Severity: 9/10  Pt denies VB, LOF, decreased FM, vaginal discharge/odor/itching. Pt denies N/V, constipation, diarrhea, or urinary problems. Pt denies fever, chills, fatigue, sweating or changes in appetite. Pt denies SOB or chest pain. Pt denies dizziness, HA, light-headedness, weakness.  Problems this pregnancy include: cHTN, SLE, hx VTE/PE. Allergies? see chart Current medications/supplements? Labetalol, plaquenil, lovenox, VitD, PNVs, Flexeril PRN, Percocet PRN Prenatal care provider? [facility], next appt tomorrow @0830    OB History    Gravida  3   Para  1   Term  1   Preterm      AB  1   Living  1     SAB  1   TAB      Ectopic      Multiple      Live Births  1           Past Medical History:  Diagnosis  Date  . Anxiety    doing good now  . Asthma   . Headache(784.0)   . HSV infection   . Hypertension   . Infection    UTI  . Lupus (HCC)    dx age 63  . Pulmonary embolism (HCC) 06/4/82011  . 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 ADENOIDECTOMY    . TONSILLECTOMY AND ADENOIDECTOMY  1998  . WISDOM TOOTH EXTRACTION      Family History  Problem Relation Age of Onset  . Diabetes Maternal Aunt   . Cancer Maternal Grandmother 72       COLON CA  . Diabetes Maternal Grandmother   . Hypertension Maternal Grandfather   . Cancer Maternal Grandfather        prostate  . Asthma Mother   . Asthma Brother   . Cancer Paternal Grandmother        breast  . Lupus Paternal Grandmother   . Lupus Paternal Aunt   . Stroke Paternal Aunt   . Anesthesia problems Neg Hx   . Hypotension Neg Hx   . Malignant hyperthermia Neg Hx   . Pseudochol deficiency Neg Hx     Social History   Tobacco Use  . Smoking status: Never Smoker  . Smokeless tobacco: Never Used  Substance  Use Topics  . Alcohol use: No  . Drug use: No    Allergies:  Allergies  Allergen Reactions  . Bactrim Anaphylaxis and Hives  . Hydrocodone-Acetaminophen Anaphylaxis  . Nifedipine Swelling and Anaphylaxis    Swelling of tongue  . Pineapple Itching  . Prednisone Anaphylaxis and Hives    Has been on Dexamethasone without issue.  . Shellfish Allergy Itching  . Sulfamethoxazole-Trimethoprim Other (See Comments)    Complications due to lupus  . Nsaids Other (See Comments)    Has Lupus, reccommended to avoid NSAIDs  . Other Itching    Shrimp:throat itching "can eat other shellfish"  . Vicodin [Hydrocodone-Acetaminophen] Hives and Swelling    Can take Percocet and Tylenol without reaction.    Medications Prior to Admission  Medication Sig Dispense Refill Last Dose  . albuterol (PROVENTIL HFA;VENTOLIN HFA) 108 (90 Base) MCG/ACT inhaler Inhale  2 puffs into the lungs every 6 (six) hours as needed for wheezing or shortness of breath.   Past Week at Unknown time  . cholecalciferol (VITAMIN D) 1000 units tablet Take 1,000 Units by mouth daily.   12/23/2018 at Unknown time  . enoxaparin (LOVENOX) 40 MG/0.4ML injection Inject 1.5 mLs (150 mg total) into the skin every 12 (twelve) hours.   12/23/2018 at Unknown time  . hydroxychloroquine (PLAQUENIL) 200 MG tablet Take 100 mg by mouth 2 (two) times daily.   12/23/2018 at Unknown time  . labetalol (NORMODYNE) 200 MG tablet Take 2 tablets (400 mg total) by mouth 2 (two) times daily. 60 tablet 5 12/23/2018 at Unknown time  . Prenatal Vit-Fe Fumarate-FA (MULTIVITAMIN-PRENATAL) 27-0.8 MG TABS tablet Take 1 tablet by mouth daily at 12 noon.   12/23/2018 at Unknown time  . promethazine (PHENERGAN) 25 MG tablet Take 0.5-1 tablets (12.5-25 mg total) by mouth every 6 (six) hours as needed. 30 tablet 0 12/22/2018 at Unknown time  . albuterol (PROVENTIL) (2.5 MG/3ML) 0.083% nebulizer solution Take 2.5 mg by nebulization every 6 (six) hours as needed for wheezing or shortness of breath.   More than a month at Unknown time  . aspirin EC 81 MG tablet Take 1 tablet (81 mg total) by mouth daily. (Patient not taking: Reported on 09/04/2018) 90 tablet 3 Not Taking  . Doxylamine-Pyridoxine ER (BONJESTA) 20-20 MG TBCR Bonjesta 20 mg-20 mg tablet,immediate and delay release  Take 1 tablet twice a day by oral route.   Not Taking  . metoCLOPramide (REGLAN) 10 MG tablet Take 1 tablet (10 mg total) by mouth every 6 (six) hours. (Patient not taking: Reported on 10/02/2018) 30 tablet 0 More than a month at Unknown time  . ondansetron (ZOFRAN) 4 MG tablet Take 8 mg by mouth 2 (two) times daily.   Not Taking  . terconazole (TERAZOL 7) 0.4 % vaginal cream terconazole 0.4 % vaginal cream  Insert 1 applicatorful every day by vaginal route for 7 days.   Not Taking    Review of Systems  Constitutional: Negative for chills, diaphoresis,  fatigue and fever.  Respiratory: Negative for shortness of breath.   Cardiovascular: Negative for chest pain.  Gastrointestinal: Positive for abdominal pain. Negative for constipation, diarrhea, nausea and vomiting.  Genitourinary: Positive for pelvic pain. Negative for dysuria, flank pain, frequency, urgency, vaginal bleeding and vaginal discharge.  Neurological: Negative for dizziness, weakness, light-headedness and headaches.   Physical Exam   Blood pressure (!) 139/94, pulse (!) 102, temperature 97.9 F (36.6 C), temperature source Oral, resp. rate 17, height 5' 8.5" (1.74 m), weight Marland Kitchen)  149.4 kg, last menstrual period 04/18/2018, SpO2 99 %.  Patient Vitals for the past 24 hrs:  BP Temp Temp src Pulse Resp SpO2 Height Weight  12/23/18 1618 (!) 139/94 - - (!) 102 17 - - -  12/23/18 1502 (!) 144/100 - - 91 - - - -  12/23/18 1448 (!) 152/101 - - (!) 117 - - - -  12/23/18 1445 - - - - - 99 % - -  12/23/18 1431 (!) 136/95 - - (!) 102 - - - -  12/23/18 1415 (!) 128/95 - - (!) 108 18 97 % - -  12/23/18 1348 (!) 130/95 - - (!) 112 18 98 % - -  12/23/18 1340 (!) 141/95 97.9 F (36.6 C) Oral (!) 112 18 98 % 5' 8.5" (1.74 m) (!) 149.4 kg   Physical Exam  Constitutional: She is oriented to person, place, and time. She appears well-developed and well-nourished. No distress.  HENT:  Head: Normocephalic and atraumatic.  Respiratory: Effort normal.  GI: Soft. She exhibits no distension and no mass. There is no abdominal tenderness. There is no rebound and no guarding.  Genitourinary: There is no rash, tenderness or lesion on the right labia. There is no rash, tenderness or lesion on the left labia.    Genitourinary Comments: CE: 2.5/thick/ballotable/posterior   Neurological: She is alert and oriented to person, place, and time.  Skin: Skin is warm and dry. She is not diaphoretic.  Psychiatric: She has a normal mood and affect. Her behavior is normal. Judgment and thought content normal.  -mild  ctx palpated when pt reports to provider she is experiencing one  Results for orders placed or performed during the hospital encounter of 12/23/18 (from the past 24 hour(s))  Urinalysis, Routine w reflex microscopic     Status: Abnormal   Collection Time: 12/23/18  2:29 PM  Result Value Ref Range   Color, Urine YELLOW YELLOW   APPearance CLEAR CLEAR   Specific Gravity, Urine 1.015 1.005 - 1.030   pH 7.0 5.0 - 8.0   Glucose, UA NEGATIVE NEGATIVE mg/dL   Hgb urine dipstick SMALL (A) NEGATIVE   Bilirubin Urine NEGATIVE NEGATIVE   Ketones, ur NEGATIVE NEGATIVE mg/dL   Protein, ur 30 (A) NEGATIVE mg/dL   Nitrite NEGATIVE NEGATIVE   Leukocytes,Ua NEGATIVE NEGATIVE   RBC / HPF 0-5 0 - 5 RBC/hpf   WBC, UA 0-5 0 - 5 WBC/hpf   Bacteria, UA RARE (A) NONE SEEN   Squamous Epithelial / LPF 0-5 0 - 5   Mucus PRESENT   Wet prep, genital     Status: Abnormal   Collection Time: 12/23/18  2:45 PM  Result Value Ref Range   Yeast Wet Prep HPF POC NONE SEEN NONE SEEN   Trich, Wet Prep NONE SEEN NONE SEEN   Clue Cells Wet Prep HPF POC NONE SEEN NONE SEEN   WBC, Wet Prep HPF POC MODERATE (A) NONE SEEN   Sperm NONE SEEN   Fetal fibronectin     Status: None   Collection Time: 12/23/18  3:10 PM  Result Value Ref Range   Fetal Fibronectin NEGATIVE NEGATIVE   Ct Angio Chest Pe W Or Wo Contrast  Result Date: 12/10/2018 CLINICAL DATA:  33 y/o F; chest pain. History of pulmonary embolus, lupus, [redacted] weeks gestation, tachycardia. EXAM: CT ANGIOGRAPHY CHEST WITH CONTRAST TECHNIQUE: Multidetector CT imaging of the chest was performed using the standard protocol during bolus administration of intravenous contrast. Multiplanar CT image reconstructions and  MIPs were obtained to evaluate the vascular anatomy. CONTRAST:  80 cc Omnipaque 350 COMPARISON:  04/17/2019 CT angiogram of the chest. FINDINGS: Cardiovascular: Respiratory motion artifact. Satisfactory opacification of the pulmonary arteries. No evidence of central,  lobar, or proximal segmental pulmonary embolism. Suboptimal assessment of downstream pulmonary arteries. Normal heart size. No pericardial effusion. Mediastinum/Nodes: No enlarged mediastinal, hilar, or axillary lymph nodes. Thyroid gland, trachea, and esophagus demonstrate no significant findings. Lungs/Pleura: Mild air trapping in the lung bases. No consolidation. No pleural effusion or pneumothorax. Upper Abdomen: No acute abnormality. Musculoskeletal: No chest wall abnormality. No acute or significant osseous findings. Review of the MIP images confirms the above findings. IMPRESSION: 1. Respiratory motion artifact. No central, lobar, or proximal segmental pulmonary embolus identified. Suboptimal assessment of downstream pulmonary arteries. 2. Mild air trapping in lower lobes compatible with small airways disease. No focal consolidation. Electronically Signed   By: Mitzi Hansen M.D.   On: 12/10/2018 23:32   [place] [facility] Ob Follow Up  Result Date: 12/19/2018 ----------------------------------------------------------------------  OBSTETRICS REPORT                       (Signed Final 12/19/2018 10:12 pm) ---------------------------------------------------------------------- Patient Info  ID #:       [id number]                          D.O.B.:  [birth year] (32 yrs)  Name:       LARINE FIELDING                 Visit Date: 12/19/2018 02:29 pm ---------------------------------------------------------------------- Performed By  Performed By:     Rennie Plowman          Ref. Address:      [facility]                                                              Obstetrics &                                                              Gynecology                                                              [address]  [address]                                                               [place], [place]                                                              [id number]  Attending:        Lin Landsmanorenthian Booker      Location[facility]                    MD                                        [facility]  Referred By:      Kirkland HunARTHUR                    STRINGER MD ---------------------------------------------------------------------- Orders   #  Description                          Code         Ordered By   1  US MFM OB FOLLOW UP                  [id number]     Lin LandsmanORENTHIAN                                                        BOOKER  ----------------------------------------------------------------------   #  Order #                    Accession #                 Episode #   1  [id number]                  [id number]                  [id number]  ---------------------------------------------------------------------- Indications   Encounter for antenatal screening for          Z36.3   malformations(insufficient fetal DNA x2 Nips,   AFP neg)   Maternal morbid obesity                        O99.210 E66.01   Systemic lupus complicating pregnancy,         O26.892, M32.9   second trimester   Medical complication of pregnancy              O26.90   (recurrent PE, on Lovenox)   Hypertension - Chronic/Pre-existing            O10.019   (labetalol)   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 Cord Insertion:       Not well visualized  Amniotic Fluid   AFI FV:      Within normal limits  AFI Sum(cm)     %Tile       Largest Pocket(cm)  12.03           32          4.64  RUQ(cm)       RLQ(cm)       LUQ(cm)        LLQ(cm)  4.64          2.85          2.99           1.55 ---------------------------------------------------------------------- Biometry  BPD:      85.1  mm     G. Age:  34w 2d         87  %    CI:        78.27   %    70 - 86                                                          FL/HC:       20.3  %    19.9 - 21.5  HC:      304.3  mm     G. Age:  33w 6d         47  %    HC/AC:       1.11       0.96 - 1.11  AC:      274.6  mm     G. Age:  31w 4d         22  %    FL/BPD:      72.5  %    71 - 87  FL:       61.7  mm     G. Age:  32w 0d         23  %    FL/AC:       22.5  %    20 - 24  HUM:      55.6  mm     G. Age:  32w 3d         50  %  LV:  5.6  mm  Est. FW:    1909   gm     4 lb 3 oz     49  % ---------------------------------------------------------------------- OB History  Gravidity:    3         Term:   1        Prem:   0        SAB:   1  TOP:          0       Ectopic:  0        Living: 1 ---------------------------------------------------------------------- Gestational Age  LMP:           35w 0d        Date:  04/18/18                 EDD:   01/23/19  U/S Today:     33w 0d                                        EDD:   02/06/19  Best:          32w 4d     Det. ByMarcella Dubs         EDD:   02/09/19                                      (06/26/18) ---------------------------------------------------------------------- Anatomy  Cranium:               Appears normal         LVOT:                   Appears normal  Cavum:                 Appears normal         Aortic Arch:            Previously seen  Ventricles:            Appears normal         Ductal Arch:            Previously seen  Choroid Plexus:        Previously seen        Diaphragm:              Appears normal  Cerebellum:            Previously seen        Stomach:                Appears  normal, left                                                                        sided  Posterior Fossa:       Previously seen        Abdomen:                Appears normal  Nuchal Fold:  Not applicable (>20    Abdominal Wall:         Appears nml (cord                         wks GA)                                        insert, abd wall)  Face:                  Appears normal         Cord Vessels:           Appears normal (3                         (orbits and profile)                           vessel cord)  Lips:                  Previously seen        Kidneys:                Appear normal  Palate:                Previously seen        Bladder:                Appears normal  Thoracic:              Appears normal         Spine:                  Previously seen  Heart:                 Appears normal         Upper Extremities:      Previously seen                         (4CH, axis, and                         situs)  RVOT:                  Appears normal         Lower Extremities:      Previously seen  Other:  Normal genaitalia previously visualized. Nasal bone previously seen.          Heels and 5th digit previously visualized. ---------------------------------------------------------------------- Cervix Uterus Adnexa  Cervix  Not visualized (advanced GA >24wks)  Left Ovary  Within normal limits.  Right Ovary  Within normal limits.  Adnexa  No abnormality visualized. ---------------------------------------------------------------------- Impression  Normal interval growth  SLE  Chronic hypertension on meds ---------------------------------------------------------------------- Recommendations  Follow up growth in 4 weeks.  Initiate weekly testing. ----------------------------------------------------------------------               Lin Landsman, MD Electronically Signed Final Report   12/19/2018 10:12 pm ----------------------------------------------------------------------  MAU Course   Procedures  MDM -r/o PTL -CE: 2.5/thick/ballotable/posterior -mild ctx palpated when pt reports to provider she is experiencing one -UA: sm hgb/30PRO/rare bacteria, sending for culture based on pt sx -WetPrep: mod WBCs, otherwise WNL -  GC/CT collected -fFN: negative -1L LR given, pt reports pain unchanged -repeat CE 2hrs later: unchanged -spoke with Dr. Jolayne Panther  re: cervical exam and fFN results and BP, recommends pt stay overnight for observation for further cervical dilation, possible betamethasone and/or magnesium and evaluation of BP -spoke with Dr. Estanislado Pandy  and relayed above, per Dr. Estanislado Pandy, pt to be admitted for observation, requests MAU provider to enter order to admit to [facility] Specialty care and then [facility] specialty care to call Dr. Estanislado Pandy for orders over the phone once pt is transferred to unit; Dr. Estanislado Pandy also requests MAU provider to enter order for betamethasone, despite negative fFN. Called Dr. Estanislado Pandy  for allergy flag, Dr. Estanislado Pandy OK with betamethasone, despite pt's anaphylactic reaction to prednisone listed in her chart and popping up as a contraindication when trying to place the order. -called and spoke with NICU , OK to admit patient, but provider notified pt that if she were to deliver, the baby may have to be transferred to another hospital, pt aware and agrees to continue with plan -care transferred to Dr. Estanislado Pandy -pt to be admitted to [facility] for observation -EFM: reactive       -baseline: 150-140       -variability: moderate       -accels: present, 15x15       -decels: infrequent variable       -TOCO: mild ctx q2-34min  Orders Placed This Encounter  Procedures  . Wet prep, genital    Standing Status:   Standing    Number of Occurrences:   1  . Culture, OB Urine    Standing Status:   Standing    Number of Occurrences:   1  . SARS Coronavirus 2 (CEPHEID - Performed in [facility] hospital lab), Hosp Order    Standing Status:   Standing     Number of Occurrences:   1    Order Specific Question:   Rule Out    Answer:   Yes  . Urinalysis, Routine w reflex microscopic    Standing Status:   Standing    Number of Occurrences:   1  . Fetal fibronectin    Standing Status:   Standing    Number of Occurrences:   1  . Insert peripheral IV    Standing Status:   Standing    Number of Occurrences:   1  . Admit to Inpatient (patient's expected length of stay will be greater than 2 midnights or inpatient only procedure)    Standing Status:   Standing    Number of Occurrences:   1    Order Specific Question:   Hospital Area    Answer:   [facility] [[id number]]    Order Specific Question:   Level of Care    Answer:   Antepartum [20]    Order Specific Question:   Covid Evaluation    Answer:   Screening Protocol (No Symptoms)    Order Specific Question:   Diagnosis    Answer:   Threatened preterm labor, third trimester [911226]    Order Specific Question:   Admitting Physician    Answer:   Silverio Lay [[id number]]    Order Specific Question:   Attending Physician    Answer:   Silverio Lay [[id number]]    Order Specific Question:   Estimated length of stay    Answer:   past midnight tomorrow    Order Specific Question:   Certification:  Answer:   I certify this patient will need inpatient services for at least 2 midnights    Order Specific Question:   PT Class (Do Not Modify)    Answer:   Inpatient [101]    Order Specific Question:   PT Acc Code (Do Not Modify)    Answer:   Private [1]   Meds ordered this encounter  Medications  . lactated ringers bolus 1,000 mL  . betamethasone acetate-betamethasone sodium phosphate (CELESTONE) injection 12 mg   Assessment and Plan   1. Threatened premature labor in third trimester   2. Preterm contractions   3. NST (non-stress test) reactive   4. [redacted] weeks gestation of pregnancy    -discussed appropriate hydration and ways to relieve preterm ctx -discussed Braxton-Hicks vs. True  Labor -will call with culture results, if positive -admit to [facility] for observation -care transferred to Dr. Earle Gell Jearl Soto 12/23/2018, 5:19 PM

## 2018-12-23 NOTE — MAU Note (Addendum)
Pt c/o contractions that have been ongoing since yesterday. Pt reports contractions every 2 to 3 minutes. Pt denies vaginal bleeding and leaking of fluid. Pt reports good fetal movement. Pt denies problems with this pregnancy.

## 2018-12-23 NOTE — H&P (Signed)
Hayley Webb is a 33 y.o. female presenting for possible preterm labor. Patient was admitted around 1400 today and reports contractions have become stronger and closer together over time. She reports 9/10 painful contractions with FACES pain scale 3/10. 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 She also denies vaginal bleeding or leaking fluid and reports good fetal movement.   OB History    Gravida  3   Para  1   Term  1   Preterm      AB  1   Living  1     SAB  1   TAB      Ectopic      Multiple      Live Births  1          Past Medical History:  Diagnosis Date  . Anxiety    doing good now  . Asthma   . Headache(784.0)   . HSV infection   . Hypertension   . Infection    UTI  . Lupus (HCC)    dx age 33  . Pulmonary embolism (HCC) 06/4/82011  . STD (sexually transmitted disease) 02/2009   POSITIVE GC   Past Surgical History:  Procedure Laterality Date  . ADENOIDECTOMY    . TONSILLECTOMY AND ADENOIDECTOMY  1998  . WISDOM TOOTH EXTRACTION     Family History: family history includes Asthma in her brother and mother; Cancer in her maternal grandfather and paternal grandmother; Cancer (age of onset: 672) in her maternal grandmother; Diabetes in her maternal aunt and maternal grandmother; Hypertension in her maternal grandfather; Lupus in her paternal aunt and paternal grandmother; Stroke in her paternal aunt. Social History:  reports that she has never smoked. She has never used smokeless tobacco. She reports that she does not drink alcohol or use drugs.     Maternal Diabetes: No Genetic Screening: Normal Maternal Ultrasounds/Referrals: Normal Fetal Ultrasounds or other Referrals:  Other: Patient with obesity, history of Lupus, and chronic hypertension. Growth q4 weeks from 24 weeks and antenatal testing from 32 weeks.  Maternal Substance Abuse:  No Significant Maternal Medications:  Meds include: Other: Flexiril, Lovenox, Hydrochloroquinolone,  Labetalol 400 BID Significant Maternal Lab Results:  None Other Comments:  HSV+, GBS pending   Review of Systems  Gastrointestinal: Positive for abdominal pain.  All other systems reviewed and are negative.  Maternal Medical History:  Reason for admission: Contractions.   Contractions: Onset was more than 2 days ago.   Frequency: irregular.   Perceived severity is strong.    Fetal activity: Perceived fetal activity is normal.   Last perceived fetal movement was within the past hour.    Prenatal complications: PIH.   Prenatal Complications - Diabetes: none.     Dilation: 2.5 Effacement (%): Thick Station: Ballotable Exam by:: Nugent,[name]   Vitals:   12/23/18 1502 12/23/18 1618 12/23/18 1735 12/23/18 1932  BP: (!) 144/100 (!) 139/94 (!) 155/90 138/68  Pulse: 91 (!) 102 (!) 103 96  Resp:  17 20 20   Temp:   97.6 F (36.4 C) 97.6 F (36.4 C)  TempSrc:   Oral Oral  SpO2:   97% 99%  Weight:      Height:         Maternal Exam:  Uterine Assessment: Contraction strength is mild.  Contraction frequency is irregular.   Abdomen: Patient reports no abdominal tenderness. Fundal height is Size=dates.   Estimated fetal weight is 4lbs .   Fetal presentation: breech  Introitus: Normal vulva.  Vulva is negative for lesion.  Normal vagina.  Vagina is negative for discharge.  Amniotic fluid character: not assessed.  Pelvis: adequate for delivery.   Cervix: Cervix evaluated by digital exam.     Fetal Exam Fetal Monitor Review: Mode: ultrasound.   Baseline rate: 140s.  Variability: moderate (6-25 bpm).   Pattern: no decelerations and accelerations present.    Fetal State Assessment: Category I - tracings are normal.      Physical Exam  Nursing note and vitals reviewed. Constitutional: She is oriented to person, place, and time. She appears well-developed and well-nourished.  HENT:  Head: Normocephalic and atraumatic.  Eyes: Pupils are equal, round, and reactive to  light.  Cardiovascular: Normal rate, regular rhythm and normal heart sounds.  Respiratory: Effort normal and breath sounds normal.  GI: Soft. Bowel sounds are normal.  Genitourinary:    Vulva, vagina and uterus normal.     No vulval lesion noted.     No vaginal discharge.   Musculoskeletal: Normal range of motion.  Neurological: She is alert and oriented to person, place, and time.  Skin: Skin is warm and dry.  Psychiatric: She has a normal mood and affect. Her behavior is normal. Judgment and thought content normal.     Prenatal labs: ABO, Rh: --/--/O POS (06/01 1804) Antibody: NEG (06/01 1804) Rubella:  Immune RPR:   NR HBsAg:   NR HIV: Non Reactive (11/14 2203)  GBS:   Collected, results pending  Assessment/Plan: 33 y.o. G3P1 at [redacted]w[redacted]d Possible preterm labor Reactive NST, category I FHTs  Lupus on Plaquenil  History of PE on Lovenox 150mg  BID Obesity with BMI 50 Preeclampsia labs drawn 12/21/18 are unremarkable  Consulted Dr. Dion Body regarding plan of care  -Begin Valtrex 500 bid  -Begin MgSO4 for preterm labor prophylaxis until betamethasone x2 doses   -Consider discontinuing Lovenox and possibly starting Heparin if patient labors  -GBS drawn and results pending    Janeece Riggers 12/23/2018, 9:23 PM

## 2018-12-24 LAB — GC/CHLAMYDIA PROBE AMP (~~LOC~~) NOT AT ARMC
Chlamydia: NEGATIVE
Neisseria Gonorrhea: NEGATIVE

## 2018-12-24 LAB — CULTURE, OB URINE: Culture: NO GROWTH

## 2018-12-24 MED ORDER — TERBUTALINE SULFATE 1 MG/ML IJ SOLN
0.2500 mg | Freq: Four times a day (QID) | INTRAMUSCULAR | Status: DC | PRN
  Administered 2018-12-25 – 2018-12-26 (×2): 0.25 mg via SUBCUTANEOUS
  Filled 2018-12-24 (×2): qty 1

## 2018-12-24 MED ORDER — ENOXAPARIN SODIUM 150 MG/ML ~~LOC~~ SOLN
150.0000 mg | Freq: Two times a day (BID) | SUBCUTANEOUS | Status: DC
  Administered 2018-12-24 – 2018-12-26 (×4): 150 mg via SUBCUTANEOUS
  Filled 2018-12-24 (×5): qty 1

## 2018-12-24 MED ORDER — BUTALBITAL-APAP-CAFFEINE 50-325-40 MG PO TABS
1.0000 | ORAL_TABLET | Freq: Four times a day (QID) | ORAL | Status: DC | PRN
  Administered 2018-12-24: 2 via ORAL
  Filled 2018-12-24: qty 2

## 2018-12-24 NOTE — Progress Notes (Signed)
When RN entered room, pt stated that she was feeling some vaginal pressure and noticed that she had thick mucus discharge when she wiped. Pt stated she felt ctx about every 15 minutes, rating the pain a 7 out of 10 when they come. Notified Dr. Normand Sloop. Received verbal order to check her cervix and call her back with the results.

## 2018-12-24 NOTE — Progress Notes (Signed)
Marsheila Foster is a 33 y.o. female patient, G3P1 at 4948w2d.   1. Threatened premature labor in third trimester   2. Preterm contractions   3. NST (non-stress test) reactive   4. [redacted] weeks gestation of Purvis [place]pregnancy    Past Medical History:  Diagnosis Date  . Anxiety    doing good now  . Asthma   . Headache(784.0)   . HSV infection   . Hypertension   . Infection    UTI  . Lupus (HCC)    dx age 33  . Pulmonary embolism (HCC) 06/4/82011  . STD (sexually transmitted disease) 02/2009   POSITIVE GC   Current Facility-Administered Medications  Medication Dose Route Frequency Provider Last Rate Last Dose  . acetaminophen (TYLENOL) tablet 1,000 mg  1,000 mg Oral Q6H PRN Rivard, Dois DavenportSandra, MD      . albuterol (PROVENTIL) (2.5 MG/3ML) 0.083% nebulizer solution 3 mL  3 mL Inhalation Q6H PRN Rivard, Dois DavenportSandra, MD      . betamethasone acetate-betamethasone sodium phosphate (CELESTONE) injection 12 mg  12 mg Intramuscular Q24 Hr x 2 Rivard, Dois DavenportSandra, MD   12 mg at 12/23/18 1821  . calcium carbonate (TUMS - dosed in mg elemental calcium) chewable tablet 400 mg of elemental calcium  2 tablet Oral Q4H PRN Rivard, Dois DavenportSandra, MD      . docusate sodium (COLACE) capsule 100 mg  100 mg Oral Daily Rivard, Sandra, MD      . enoxaparin (LOVENOX) injection 150 mg  150 mg Subcutaneous Q12H Silverio Layivard, Sandra, MD   Stopped at 12/24/18 [phone]  . hydroxychloroquine (PLAQUENIL) tablet 100 mg  100 mg Oral BID Silverio Layivard, Sandra, MD   100 mg at 12/23/18 2103  . labetalol (NORMODYNE) tablet 400 mg  400 mg Oral BID Silverio Layivard, Sandra, MD   400 mg at 12/23/18 2103  . lactated ringers infusion   Intravenous Continuous Janeece RiggersGreer, Stephen Baruch K, [name] 100 mL/hr at 12/23/18 2304    . magnesium sulfate 40 grams in LR 500 mL OB infusion  2 g/hr Intravenous Titrated Janeece RiggersGreer, Jacorion Klem K, [name] 25 mL/hr at 12/23/18 2303 2 g/hr at 12/23/18 2303  . oxyCODONE (Oxy IR/ROXICODONE) immediate release tablet 5 mg  5 mg Oral Q4H PRN Silverio Layivard, Sandra, MD   5 mg at 12/23/18 2000  .  prenatal multivitamin tablet 1 tablet  1 tablet Oral Q1200 Rivard, Dois DavenportSandra, MD      . promethazine (PHENERGAN) tablet 12.5-25 mg  12.5-25 mg Oral Q6H PRN Rivard, Dois DavenportSandra, MD      . valACYclovir (VALTREX) tablet 500 mg  500 mg Oral BID Janeece RiggersGreer, Trenisha Lafavor K, [name]   500 mg at 12/23/18 2354  . zolpidem (AMBIEN) tablet 5 mg  5 mg Oral QHS PRN Silverio Layivard, Sandra, MD   5 mg at 12/24/18 0200   Allergies  Allergen Reactions  . Bactrim Anaphylaxis and Hives  . Hydrocodone-Acetaminophen Anaphylaxis  . Nifedipine Swelling and Anaphylaxis    Swelling of tongue  . Pineapple Itching  . Prednisone Anaphylaxis and Hives    Has been on Dexamethasone without issue.  . Shellfish Allergy Itching  . Sulfamethoxazole-Trimethoprim Other (See Comments)    Complications due to lupus  . Nsaids Other (See Comments)    Has Lupus, reccommended to avoid NSAIDs  . Other Itching    Shrimp:throat itching "can eat other shellfish"  . Vicodin [Hydrocodone-Acetaminophen] Hives and Swelling    Can take Percocet and Tylenol without reaction.   Active Problems:   Threatened preterm labor, third trimester   Preterm labor  Subjective  Patient reports contractions have lessened overnight. They do not feel as strong. She is comfortable and was able to sleep. No vaginal bleeding or loss of fluid and good fetal movement.   Objective: Vital signs: (most recent): Blood pressure 131/67, pulse 92, temperature 98.2 F (36.8 C), temperature source Oral, resp. rate 17, height 5' 8.5" (1.74 m), weight (!) 149.4 kg, last menstrual period 04/18/2018, SpO2 98 %.   Lungs:  Normal effort.  She is not in respiratory distress.   Extremities: Normal range of motion.   Neurological: Patient is alert.   Pupils:  Pupils are equal, round, and reactive to light.   Skin:  Warm and dry.     Vitals:   12/24/18 0400 12/24/18 0500 12/24/18 0600 12/24/18 0700  BP: 134/85 128/80 (!) 124/49 131/67  Pulse: 100 92 94 92  Resp: 17 20 19 17   Temp:       TempSrc:      SpO2: 97% 95% 94% 98%  Weight:      Height:       FHTs 120s with moderate beat to beat variability, reactive NST. -decels   No contractions tracing.   Physical Exam  Nursing note and vitals reviewed. Constitutional: She is oriented to person, place, and time. She appears well-developed and well-nourished.  HENT:  Head: Normocephalic and atraumatic.  Eyes: Pupils are equal, round, and reactive to light.  Respiratory: Effort normal. No respiratory distress.  Musculoskeletal: Normal range of motion.  Neurological: She is alert and oriented to person, place, and time.  Skin: Skin is warm and dry.  Psychiatric: She has a normal mood and affect. Her behavior is normal. Judgment and thought content normal.   Assessment & Plan 33 y.o. 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 Normand Sloop regarding plan of care             Continue MgSO4 for preterm labor prophylaxis at least until betamethasone x2  doses              Consider discontinuing Lovenox and possibly starting Heparin if patient labors             GBS collected and results pending   Janeece Riggers 12/24/2018

## 2018-12-24 NOTE — Progress Notes (Signed)
Pt without complaints.  No leakage of fluid or VB.  Good FM she states she is allergic to procardia BP 136/78 (BP Location: Left Arm)   Pulse 95   Temp 97.6 F (36.4 C) (Oral)   Resp 18   Ht 5' 8.5" (1.74 m)   Wt (!) 149.4 kg   LMP 04/18/2018 (Exact Date)   SpO2 97%   BMI 49.36 kg/m   FHTS Baseline: 130 bpm and Accelerations: Reactive  Toco irregular, every 40-60 minutes  Pt in NAD CV RRR Lungs CTAB abd  Gravid soft and NT GU no vb EXt no calf tenderness Results for orders placed or performed during the hospital encounter of 12/23/18 (from the past 72 hour(s))  Urinalysis, Routine w reflex microscopic     Status: Abnormal   Collection Time: 12/23/18  2:29 PM  Result Value Ref Range   Color, Urine YELLOW YELLOW   APPearance CLEAR CLEAR   Specific Gravity, Urine 1.015 1.005 - 1.030   pH 7.0 5.0 - 8.0   Glucose, UA NEGATIVE NEGATIVE mg/dL   Hgb urine dipstick SMALL (A) NEGATIVE   Bilirubin Urine NEGATIVE NEGATIVE   Ketones, ur NEGATIVE NEGATIVE mg/dL   Protein, ur 30 (A) NEGATIVE mg/dL   Nitrite NEGATIVE NEGATIVE   Leukocytes,Ua NEGATIVE NEGATIVE   RBC / HPF 0-5 0 - 5 RBC/hpf   WBC, UA 0-5 0 - 5 WBC/hpf   Bacteria, UA RARE (A) NONE SEEN   Squamous Epithelial / LPF 0-5 0 - 5   Mucus PRESENT     Comment: Performed at [facility], [address], [place], [place] [address]  Culture, [place] Urine     Status: None   Collection Time: 12/23/18  2:29 PM  Result Value Ref Range   Specimen Description URINE, RANDOM    Special Requests NONE    Culture      NO GROWTH NO GROUP B STREP (S.AGALACTIAE) ISOLATED Performed at [facility], [address] [place], [place] [address]    Report Status 12/24/2018 FINAL   Wet prep, genital     Status: Abnormal   Collection Time: 12/23/18  2:45 PM  Result Value Ref Range   Yeast Wet Prep HPF POC NONE SEEN NONE SEEN   Trich, Wet Prep NONE SEEN NONE SEEN   Clue Cells Wet Prep HPF POC NONE SEEN NONE SEEN   WBC, Wet Prep HPF POC  MODERATE (A) NONE SEEN   Sperm NONE SEEN     Comment: Performed at [facility], [address] [place], [place] [address]  Fetal fibronectin     Status: None   Collection Time: 12/23/18  3:10 PM  Result Value Ref Range   Fetal Fibronectin NEGATIVE NEGATIVE    Comment: Performed at [facility], [address], [place], [place] [address]  SARS Coronavirus 2 (CEPHEID - Performed in [facility] hospital lab), Hosp Order     Status: None   Collection Time: 12/23/18  5:22 PM  Result Value Ref Range   SARS Coronavirus 2 NEGATIVE NEGATIVE    Comment: (NOTE) If result is NEGATIVE SARS-CoV-2 target nucleic acids are NOT DETECTED. The SARS-CoV-2 RNA is generally detectable in upper and lower  respiratory specimens during the acute phase of infection. The lowest  concentration of SARS-CoV-2 viral copies this assay can detect is 250  copies / mL. A negative result does not preclude SARS-CoV-2 infection  and should not be used as the sole basis for  treatment or other  patient management decisions.  A negative result may occur with  improper specimen collection / handling, submission of specimen other  than nasopharyngeal swab, presence of viral mutation(s) within the  areas targeted by this assay, and inadequate number of viral copies  (<250 copies / mL). A negative result must be combined with clinical  observations, patient history, and epidemiological information. If result is POSITIVE SARS-CoV-2 target nucleic acids are DETECTED. The SARS-CoV-2 RNA is generally detectable in upper and lower  respiratory specimens dur ing the acute phase of infection.  Positive  results are indicative of active infection with SARS-CoV-2.  Clinical  correlation with patient history and other diagnostic information is  necessary to determine patient infection status.  Positive results do  not rule out bacterial infection or co-infection with other viruses. If result is PRESUMPTIVE  POSTIVE SARS-CoV-2 nucleic acids MAY BE PRESENT.   A presumptive positive result was obtained on the submitted specimen  and confirmed on repeat testing.  While 2019 novel coronavirus  (SARS-CoV-2) nucleic acids may be present in the submitted sample  additional confirmatory testing may be necessary for epidemiological  and / or clinical management purposes  to differentiate between  SARS-CoV-2 and other Sarbecovirus currently known to infect humans.  If clinically indicated additional testing with an alternate test  methodology [phone]([id number]) is advised. The SARS-CoV-2 RNA is generally  detectable in upper and lower respiratory sp ecimens during the acute  phase of infection. The expected result is Negative. Fact Sheet for Patients:  BoilerBrush.com.cyhttps://www.fda.gov/media/136312/download Fact Sheet for Healthcare Providers: https://pope.com/https://www.fda.gov/media/136313/download This test is not yet approved or cleared by the [place] FDA and has been authorized for detection and/or diagnosis of SARS-CoV-2 by FDA under an Emergency Use Authorization (EUA).  This EUA will remain in effect (meaning this test can be used) for the duration of the COVID-19 declaration under Section 564(b)(1) of the Act, 21 U.S.C. section 360bbb-3(b)(1), unless the authorization is terminated or revoked sooner. Performed at [facility], [address] [place], [place] [address]   Type and screen [facility]     Status: None   Collection Time: 12/23/18  6:04 PM  Result Value Ref Range   ABO/RH(D) O POS    Antibody Screen NEG    Sample Expiration      12/26/2018,2359 Performed at [facility], [address] [place], [place] [address]   CBC     Status: Abnormal   Collection Time: 12/23/18  6:04 PM  Result Value Ref Range   WBC 7.3 4.0 - 10.5 K/uL   RBC 3.83 (L) 3.87 - 5.11 MIL/uL   Hemoglobin 10.9 (L) 12.0 - 15.0 g/dL   HCT 47.832.7 (L) 29.536.0 - 62.146.0 %   MCV 85.4 80.0 - 100.0 fL   MCH 28.5 26.0 -  34.0 pg   MCHC 33.3 30.0 - 36.0 g/dL   RDW 30.813.5 65.711.5 - 84.615.5 %   Platelets 326 150 - 400 K/uL   nRBC 0.0 0.0 - 0.2 %    Comment: Performed at [facility], [address] [place], [place] [address]  Creatinine, serum     Status: None   Collection Time: 12/23/18  6:04 PM  Result Value Ref Range   Creatinine, Ser 0.59 0.44 - 1.00 mg/dL   GFR calc non Af Amer >60 >60 mL/min   GFR calc Af Amer >60 >60 mL/min    Comment: Performed at [facility], [address], [place], [place] [address]  ABO/Rh     Status: None   Collection Time: 12/23/18  6:04 PM  Result Value Ref Range   ABO/RH(D)      O POS Performed at [facility], [address] [place], [place] [address]   Culture, beta strep (group b only)     Status: None (Preliminary result)   Collection Time: 12/23/18  9:21 PM  Result Value Ref Range   Specimen Description VAGINAL/RECTAL    Special Requests NONE    Culture      CULTURE REINCUBATED FOR BETTER GROWTH Performed at [facility], [address] [place], [place] [address]    Report Status PENDING     Assessment and Plan [redacted]w[redacted]d Preterm labor Pt given steroids GBS neg Will stop magnesium in 24 hours and try terb as needed.   BP stable Lupus in remission  Pt stable will monitor closely   Patient ID: Hayley Webb, female   DOB: [birth year], 33 y.o.   MRN: [id number]

## 2018-12-25 DIAGNOSIS — O99213 Obesity complicating pregnancy, third trimester: Secondary | ICD-10-CM | POA: Diagnosis present

## 2018-12-25 DIAGNOSIS — O10013 Pre-existing essential hypertension complicating pregnancy, third trimester: Secondary | ICD-10-CM | POA: Diagnosis present

## 2018-12-25 DIAGNOSIS — Z86711 Personal history of pulmonary embolism: Secondary | ICD-10-CM | POA: Diagnosis not present

## 2018-12-25 DIAGNOSIS — O98313 Other infections with a predominantly sexual mode of transmission complicating pregnancy, third trimester: Secondary | ICD-10-CM | POA: Diagnosis present

## 2018-12-25 DIAGNOSIS — Z3A33 33 weeks gestation of pregnancy: Secondary | ICD-10-CM | POA: Diagnosis not present

## 2018-12-25 DIAGNOSIS — O4700 False labor before 37 completed weeks of gestation, unspecified trimester: Secondary | ICD-10-CM | POA: Diagnosis present

## 2018-12-25 DIAGNOSIS — Z1159 Encounter for screening for other viral diseases: Secondary | ICD-10-CM | POA: Diagnosis not present

## 2018-12-25 DIAGNOSIS — E669 Obesity, unspecified: Secondary | ICD-10-CM | POA: Diagnosis present

## 2018-12-25 DIAGNOSIS — A6000 Herpesviral infection of urogenital system, unspecified: Secondary | ICD-10-CM | POA: Diagnosis present

## 2018-12-25 DIAGNOSIS — O9989 Other specified diseases and conditions complicating pregnancy, childbirth and the puerperium: Secondary | ICD-10-CM | POA: Diagnosis present

## 2018-12-25 DIAGNOSIS — M329 Systemic lupus erythematosus, unspecified: Secondary | ICD-10-CM | POA: Diagnosis present

## 2018-12-25 HISTORY — DX: False labor before 37 completed weeks of gestation, unspecified trimester: O47.00

## 2018-12-25 LAB — CULTURE, BETA STREP (GROUP B ONLY)

## 2018-12-25 NOTE — Progress Notes (Signed)
MD Progress Note  33 y.o. [id number] [redacted]w[redacted]d HD#2 admitted for preterm labor  Evaluated patient at bedside for worsening contraction pain and pelvic pressure.  Patient also reports increase bloody show. No leaking of fluid, reports good fetal movement.  She denies pre E sx.     Problem List   Diagnosis Date Noted  . Threatened preterm labor, third trimester 12/23/2018  . Preterm labor 12/23/2018  . Chronic hypertension affecting pregnancy 07/25/2018  . Maternal obesity affecting pregnancy, antepartum 06/11/2018  . History of pulmonary embolus (PE) 07/24/2016  . Vaginal delivery--VE assist 10/25/2013  . 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 Obesity-BMI 47 05/09/2013  . Lupus (HCC)   . Asthma   . HSV 2     Physical Examination:   Vitals:   12/25/18 1210 12/25/18 1433  BP: (!) 148/83   Pulse: 87 85  Resp: 18   Temp: 97.7 F (36.5 C)   SpO2: 100%    General appearance - alert, well appearing, and in no distress and oriented to person, place, and time Mental status - normal mood, behavior, speech, dress, motor activity, and thought processes Abd  Soft, gravid, nontender Ex SCDs FHTs  125-130's baseline moderate variability no accels, no decels Toco  Uterine irritability irregular ctx  Cervix: per RN: 3/20/head ballotable   Assessment: HD#1  [redacted]w[redacted]d with preterm contractions.  GBS NEG  Plan: 1. Continue continuous monitoring for now 2. Terbutaline dose x 1 now 3. Will re-check patient if ctx pain worsens  Tylin Stradley STACIA

## 2018-12-25 NOTE — Progress Notes (Signed)
Hayley Webb is a 33 y.o. female patient, G3P1 at 2871w3d.   1. Threatened premature labor in third trimester   2. Preterm contractions   3. NST (non-stress test) reactive   4. [redacted] weeks gestation of pregnancy    Past Medical History:  Diagnosis Date  . Anxiety    doing good now  . Asthma   . Headache(784.0)   . HSV infection   . Hypertension   . Infection    UTI  . Lupus (HCC)    dx age 33  . Pulmonary embolism (HCC) 06/4/82011  . STD (sexually transmitted disease) 02/2009   POSITIVE GC   Current Facility-Administered Medications  Medication Dose Route Frequency Provider Last Rate Last Dose  . acetaminophen (TYLENOL) tablet 1,000 mg  1,000 mg Oral Q6H PRN Silverio Layivard, Sandra, MD   1,000 mg at 12/24/18 1045  . albuterol (PROVENTIL) (2.5 MG/3ML) 0.083% nebulizer solution 3 mL  3 mL Inhalation Q6H PRN Rivard, Dois DavenportSandra, MD      . butalbital-acetaminophen-caffeine (FIORICET) 50-325-40 MG per tablet 1-2 tablet  1-2 tablet Oral Q6H PRN Jaymes Graffillard, Naima, MD   2 tablet at 12/24/18 1432  . calcium carbonate (TUMS - dosed in mg elemental calcium) chewable tablet 400 mg of elemental calcium  2 tablet Oral Q4H PRN Rivard, Dois DavenportSandra, MD   400 mg of elemental calcium at 12/24/18 1848  . docusate sodium (COLACE) capsule 100 mg  100 mg Oral Daily Rivard, Dois DavenportSandra, MD   100 mg at 12/24/18 1008  . enoxaparin (LOVENOX) injection 150 mg  150 mg Subcutaneous Q12H Lawerance BachMcCarthy, Megan L, RPH   150 mg at 12/24/18 1843  . hydroxychloroquine (PLAQUENIL) tablet 100 mg  100 mg Oral BID Silverio Layivard, Sandra, MD   100 mg at 12/24/18 2254  . labetalol (NORMODYNE) tablet 400 mg  400 mg Oral BID Silverio Layivard, Sandra, MD   400 mg at 12/24/18 2148  . oxyCODONE (Oxy IR/ROXICODONE) immediate release tablet 5 mg  5 mg Oral Q4H PRN Silverio Layivard, Sandra, MD   5 mg at 12/23/18 2000  . prenatal multivitamin tablet 1 tablet  1 tablet Oral Q1200 Silverio Layivard, Sandra, MD   1 tablet at 12/24/18 1136  . promethazine (PHENERGAN) tablet 12.5-25 mg  12.5-25 mg Oral Q6H PRN  Rivard, Dois DavenportSandra, MD      . terbutaline (BRETHINE) injection 0.25 mg  0.25 mg Subcutaneous Q6H PRN Janeece RiggersGreer, [name], [name]      . valACYclovir (VALTREX) tablet 500 mg  500 mg Oral BID Janeece RiggersGreer, [name], [name]   500 mg at 12/24/18 2148  . zolpidem (AMBIEN) tablet 5 mg  5 mg Oral QHS PRN Silverio Layivard, Sandra, MD   5 mg at 12/25/18 0033   Allergies  Allergen Reactions  . Bactrim Anaphylaxis and Hives  . Hydrocodone-Acetaminophen Anaphylaxis  . Nifedipine Swelling and Anaphylaxis    Swelling of tongue  . Pineapple Itching  . Prednisone Anaphylaxis and Hives    Has been on Dexamethasone without issue.  . Shellfish Allergy Itching  . Sulfamethoxazole-Trimethoprim Other (See Comments)    Complications due to lupus  . Nsaids Other (See Comments)    Has Lupus, reccommended to avoid NSAIDs  . Other Itching    Shrimp:throat itching "can eat other shellfish"  . Vicodin [Hydrocodone-Acetaminophen] Hives and Swelling    Can take Percocet and Tylenol without reaction.   Active Problems:   Threatened preterm labor, third trimester   Preterm labor  Subjective  Patient reports contractions have spaced out considerably. They are now rare and  mild. She reports scant spotting x1 when wiping after using restroom. Patient has had multiple vaginal exams. She is to inform us if spotting worsens or persists. No leaking of fluid and good fetal movement.   Objective: Vital signs: (most recent): Blood pressure 140/74, pulse 91, temperature 97.8 F (36.6 C), temperature source Oral, resp. rate 18, height 5' 8.5" (1.74 m), weight (!) 149.4 kg, last menstrual period 04/18/2018, SpO2 100 %.   Lungs:  Normal effort.  She is not in respiratory distress.   Extremities: Normal range of motion.   Neurological: Patient is alert.   Pupils:  Pupils are equal, round, and reactive to light.   Skin:  Warm and dry.     Vitals:   12/24/18 1700 12/24/18 1800 12/24/18 1909 12/24/18 2314  BP:   140/75 140/74  Pulse:   87 91  Resp: 18 17  18 18   Temp:   97.6 F (36.4 C) 97.8 F (36.6 C)  TempSrc:   Oral Oral  SpO2:   99% 100%  Weight:      Height:       FHTs 130s with moderate beat to beat variability, reactive NST, -decels   Rare UCs, greater than 30 minutes apart.   Physical Exam  Nursing note and vitals reviewed. Constitutional: She is oriented to person, place, and time. She appears well-developed and well-nourished.  HENT:  Head: Normocephalic and atraumatic.  Eyes: Pupils are equal, round, and reactive to light.  Respiratory: Effort normal. No respiratory distress.  Musculoskeletal: Normal range of motion.  Neurological: She is alert and oriented to person, place, and time.  Skin: Skin is warm and dry.  Psychiatric: She has a normal mood and affect. Her behavior is normal. Judgment and thought content normal.   Assessment & Plan 33 y.o. 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 Normand Sloop regarding plan of care             MgSO4 stopped after 24 hours  Added terbutaline for tocolysis PRN             GBS collected and results pending   Patient may be candidate for discharge later today if she remains stable   Janeece Riggers 12/25/2018

## 2018-12-26 MED ORDER — LACTATED RINGERS IV BOLUS
500.0000 mL | Freq: Once | INTRAVENOUS | Status: AC
  Administered 2018-12-26: 500 mL via INTRAVENOUS

## 2018-12-26 MED ORDER — HYDROXYCHLOROQUINE SULFATE 200 MG PO TABS
200.0000 mg | ORAL_TABLET | Freq: Every day | ORAL | Status: DC
  Administered 2018-12-26: 09:00:00 200 mg via ORAL
  Filled 2018-12-26: qty 1

## 2018-12-26 MED ORDER — HYDROXYCHLOROQUINE SULFATE 200 MG PO TABS
100.0000 mg | ORAL_TABLET | Freq: Every day | ORAL | Status: DC
  Filled 2018-12-26: qty 0.5

## 2018-12-26 MED ORDER — VALACYCLOVIR HCL 500 MG PO TABS: 500.0000 mg | ORAL_TABLET | Freq: Two times a day (BID) | ORAL | 1 refills | Status: DC

## 2018-12-26 MED ORDER — TERBUTALINE SULFATE 2.5 MG PO TABS: 2.5000 mg | ORAL_TABLET | Freq: Four times a day (QID) | ORAL | 1 refills | Status: DC | PRN

## 2018-12-26 NOTE — Discharge Summary (Signed)
ANTENATAL DISCHARGE SUMMARY  Patient ID: Hayley Webb MRN: [id number] DOB/AGE: 1986-06-14 32 y.o.  Admit date: 12/23/2018 Discharge date: 12/26/2018  Admission Diagnoses: CTX 2 TO 3 MINS  Discharge Diagnoses: CTX 2 TO 3 MINS         Discharged Condition: good  Hospital Course: patient was monitored for 3 days with no cervical change. Betamethasone completed. Received 24 hours of MgSO4.   Consults: None  Disposition: home with Valtrex 500 mg BID and Terbutaline 2.5 mg every 6 hours as needed  Follow-up at [facility]: 1 week, office will call patient to schedule     Signed: Esmeralda Arthur, MD MD 12/26/2018, 2:47 PM

## 2018-12-26 NOTE — Discharge Instructions (Signed)

## 2018-12-26 NOTE — Progress Notes (Signed)
Dr Estanislado Pandy notified of pt c/o increased pressure and scant bleeding with mucous. Rn to check cervix.

## 2018-12-26 NOTE — Progress Notes (Addendum)
Hayley Webb is a 33 y.o. female patient, G3P1 at [redacted]w[redacted]d.   1. Threatened premature labor in third trimester   2. Preterm contractions   3. NST (non-stress test) reactive   4. [redacted] weeks gestation of pregnancy    Past Medical History:  Diagnosis Date  . Anxiety    doing good now  . Asthma   . Headache(784.0)   . HSV infection   . Hypertension   . Infection    UTI  . Lupus (HCC)    dx age 27  . Pulmonary embolism (HCC) 06/4/82011  . STD (sexually transmitted disease) 02/2009   POSITIVE GC   Current Facility-Administered Medications  Medication Dose Route Frequency Provider Last Rate Last Dose  . acetaminophen (TYLENOL) tablet 1,000 mg  1,000 mg Oral Q6H PRN Silverio Lay, MD   1,000 mg at 12/24/18 1045  . albuterol (PROVENTIL) (2.5 MG/3ML) 0.083% nebulizer solution 3 mL  3 mL Inhalation Q6H PRN Jazzalynn Rhudy, Dois Davenport, MD      . butalbital-acetaminophen-caffeine (FIORICET) 50-325-40 MG per tablet 1-2 tablet  1-2 tablet Oral Q6H PRN Jaymes Graff, MD   2 tablet at 12/24/18 1432  . calcium carbonate (TUMS - dosed in mg elemental calcium) chewable tablet 400 mg of elemental calcium  2 tablet Oral Q4H PRN Kathelyn Gombos, Dois Davenport, MD   400 mg of elemental calcium at 12/25/18 1847  . docusate sodium (COLACE) capsule 100 mg  100 mg Oral Daily Bethsaida Siegenthaler, Dois Davenport, MD   100 mg at 12/25/18 1103  . enoxaparin (LOVENOX) injection 150 mg  150 mg Subcutaneous Q12H Lawerance Bach, RPH   150 mg at 12/25/18 1836  . hydroxychloroquine (PLAQUENIL) tablet 100 mg  100 mg Oral BID Silverio Lay, MD   100 mg at 12/25/18 1103  . labetalol (NORMODYNE) tablet 400 mg  400 mg Oral BID Silverio Lay, MD   400 mg at 12/25/18 2124  . oxyCODONE (Oxy IR/ROXICODONE) immediate release tablet 5 mg  5 mg Oral Q4H PRN Silverio Lay, MD   5 mg at 12/25/18 1335  . prenatal multivitamin tablet 1 tablet  1 tablet Oral Q1200 Silverio Lay, MD   1 tablet at 12/25/18 1103  . promethazine (PHENERGAN) tablet 12.5-25 mg  12.5-25 mg Oral Q6H PRN  Talley Kreiser, Dois Davenport, MD      . terbutaline (BRETHINE) injection 0.25 mg  0.25 mg Subcutaneous Q6H PRN Janeece Riggers, [name]   0.25 mg at 12/26/18 0016  . valACYclovir (VALTREX) tablet 500 mg  500 mg Oral BID Janeece Riggers, [name]   500 mg at 12/25/18 2124  . zolpidem (AMBIEN) tablet 5 mg  5 mg Oral QHS PRN Silverio Lay, MD   5 mg at 12/25/18 0033   Allergies  Allergen Reactions  . Bactrim Anaphylaxis and Hives  . Hydrocodone-Acetaminophen Anaphylaxis  . Nifedipine Swelling and Anaphylaxis    Swelling of tongue  . Pineapple Itching  . Prednisone Anaphylaxis and Hives    Has been on Dexamethasone without issue.  . Shellfish Allergy Itching  . Sulfamethoxazole-Trimethoprim Other (See Comments)    Complications due to lupus  . Nsaids Other (See Comments)    Has Lupus, reccommended to avoid NSAIDs  . Other Itching    Shrimp:throat itching "can eat other shellfish"  . Vicodin [Hydrocodone-Acetaminophen] Hives and Swelling    Can take Percocet and Tylenol without reaction.   Active Problems:   Threatened preterm labor, third trimester   Preterm labor   Threatened preterm labor  Subjective  Patient reports feeling 7/10  contractions q2-3 minutes. On tocometry they are tracing q3-8 minutes and are irregular. On exam patient's cervical exam was consistent with the exam I did on the night of 6/1. RN to give Terbutaline PRN for uterine contractions and IV fluid bolus given.   Objective: Vital signs: (most recent): Blood pressure 138/74, pulse 86, temperature 98.7 F (37.1 C), temperature source Oral, resp. rate 19, height 5' 8.5" (1.74 m), weight (!) 149.4 kg, last menstrual period 04/18/2018, SpO2 99 %.   Lungs:  Normal effort.  She is not in respiratory distress.   Extremities: Normal range of motion.   Neurological: Patient is alert.   Pupils:  Pupils are equal, round, and reactive to light.   Skin:  Warm and dry.     Vitals:   12/25/18 1433 12/25/18 1545 12/25/18 1920 12/25/18 2325  BP:   (!) 141/81 133/66 138/74  Pulse: 85 88 86 86  Resp:  18 17 19   Temp:  98 F (36.7 C) 97.8 F (36.6 C) 98.7 F (37.1 C)  TempSrc:  Oral Oral Oral  SpO2:  98% 99% 99%  Weight:      Height:       FHTs 130s with moderate beat to beat variability, reactive NST, -decels   Physical Exam  Nursing note and vitals reviewed. Constitutional: She is oriented to person, place, and time. She appears well-developed and well-nourished.  HENT:  Head: Normocephalic and atraumatic.  Eyes: Pupils are equal, round, and reactive to light.  Respiratory: Effort normal. No respiratory distress.  Musculoskeletal: Normal range of motion.  Neurological: She is alert and oriented to person, place, and time.  Skin: Skin is warm and dry.  Psychiatric: She has a normal mood and affect. Her behavior is normal. Judgment and thought content normal.   Dilation: 3 Effacement (%): 50 Cervical Position: Posterior Station: Ballotable Presentation: Vertex Exam by:: Kathalene FramesEllis Greer [name]  Assessment & Plan 33 y.o. G3P1 at 6062w4d Possible preterm labor vs. 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 Estanislado Pandyivard in the morning   Janeece RiggersEllis K Greer 12/26/2018  Patient seen Reports contractions have subsided but continues to feel pressure Cervix was rechecked at noon and unchanged. Some mild bloody mucousy discharge following cervical checks Reviewed PTL warnings with patient who feels comfortable with discharge home today. Recommend to wear maternity support band to help with pressure Will follow in 1 week

## 2019-01-10 ENCOUNTER — Inpatient Hospital Stay (EMERGENCY_DEPARTMENT_HOSPITAL)
Admission: AD | Admit: 2019-01-10 | Discharge: 2019-01-10 | Disposition: A | Discharge status: Home / Self Care | Payer: 59 | Source: Home / Self Care | Attending: Obstetrics and Gynecology | Admitting: Obstetrics and Gynecology

## 2019-01-10 ENCOUNTER — Other Ambulatory Visit: Payer: Self-pay

## 2019-01-10 ENCOUNTER — Encounter (HOSPITAL_COMMUNITY): Payer: Self-pay | Admitting: *Deleted

## 2019-01-10 DIAGNOSIS — Z3A35 35 weeks gestation of pregnancy: Secondary | ICD-10-CM | POA: Insufficient documentation

## 2019-01-10 DIAGNOSIS — R51 Headache: Secondary | ICD-10-CM

## 2019-01-10 DIAGNOSIS — Z7982 Long term (current) use of aspirin: Secondary | ICD-10-CM | POA: Insufficient documentation

## 2019-01-10 DIAGNOSIS — J45909 Unspecified asthma, uncomplicated: Secondary | ICD-10-CM | POA: Insufficient documentation

## 2019-01-10 DIAGNOSIS — O114 Pre-existing hypertension with pre-eclampsia, complicating childbirth: Secondary | ICD-10-CM | POA: Diagnosis not present

## 2019-01-10 DIAGNOSIS — O99513 Diseases of the respiratory system complicating pregnancy, third trimester: Secondary | ICD-10-CM | POA: Insufficient documentation

## 2019-01-10 DIAGNOSIS — O9989 Other specified diseases and conditions complicating pregnancy, childbirth and the puerperium: Secondary | ICD-10-CM | POA: Insufficient documentation

## 2019-01-10 DIAGNOSIS — Z3689 Encounter for other specified antenatal screening: Secondary | ICD-10-CM | POA: Diagnosis not present

## 2019-01-10 DIAGNOSIS — Z86711 Personal history of pulmonary embolism: Secondary | ICD-10-CM | POA: Insufficient documentation

## 2019-01-10 DIAGNOSIS — O10913 Unspecified pre-existing hypertension complicating pregnancy, third trimester: Secondary | ICD-10-CM

## 2019-01-10 DIAGNOSIS — O26893 Other specified pregnancy related conditions, third trimester: Secondary | ICD-10-CM | POA: Insufficient documentation

## 2019-01-10 DIAGNOSIS — O1093 Unspecified pre-existing hypertension complicating the puerperium: Secondary | ICD-10-CM | POA: Insufficient documentation

## 2019-01-10 DIAGNOSIS — Z79899 Other long term (current) drug therapy: Secondary | ICD-10-CM | POA: Insufficient documentation

## 2019-01-10 DIAGNOSIS — Z7901 Long term (current) use of anticoagulants: Secondary | ICD-10-CM | POA: Insufficient documentation

## 2019-01-10 DIAGNOSIS — O10919 Unspecified pre-existing hypertension complicating pregnancy, unspecified trimester: Secondary | ICD-10-CM

## 2019-01-10 DIAGNOSIS — M329 Systemic lupus erythematosus, unspecified: Secondary | ICD-10-CM | POA: Insufficient documentation

## 2019-01-10 LAB — COMPREHENSIVE METABOLIC PANEL
ALT: 11 U/L (ref 0–44)
AST: 14 U/L — ABNORMAL LOW (ref 15–41)
Albumin: 2.6 g/dL — ABNORMAL LOW (ref 3.5–5.0)
Alkaline Phosphatase: 146 U/L — ABNORMAL HIGH (ref 38–126)
Anion gap: 8 (ref 5–15)
BUN: 6 mg/dL (ref 6–20)
CO2: 20 mmol/L — ABNORMAL LOW (ref 22–32)
Calcium: 9.2 mg/dL (ref 8.9–10.3)
Chloride: 105 mmol/L (ref 98–111)
Creatinine, Ser: 0.53 mg/dL (ref 0.44–1.00)
GFR calc Af Amer: >60 mL/min (ref >60)
GFR calc non Af Amer: >60 mL/min (ref >60)
Glucose, Bld: 109 mg/dL — ABNORMAL HIGH (ref 70–99)
Potassium: 3.4 mmol/L — ABNORMAL LOW (ref 3.5–5.1)
Sodium: 133 mmol/L — ABNORMAL LOW (ref 135–145)
Total Bilirubin: 0.2 mg/dL — ABNORMAL LOW (ref 0.3–1.2)
Total Protein: 6.8 g/dL (ref 6.5–8.1)

## 2019-01-10 LAB — CBC
HCT: 33.4 % — ABNORMAL LOW (ref 36.0–46.0)
Hemoglobin: 11 g/dL — ABNORMAL LOW (ref 12.0–15.0)
MCH: 27.5 pg (ref 26.0–34.0)
MCHC: 32.9 g/dL (ref 30.0–36.0)
MCV: 83.5 fL (ref 80.0–100.0)
Platelets: 320 10*3/uL (ref 150–400)
RBC: 4 MIL/uL (ref 3.87–5.11)
RDW: 13.2 % (ref 11.5–15.5)
WBC: 5.5 10*3/uL (ref 4.0–10.5)
nRBC: 0 % (ref 0.0–0.2)

## 2019-01-10 LAB — URINALYSIS, ROUTINE W REFLEX MICROSCOPIC
Bilirubin Urine: NEGATIVE
Glucose, UA: NEGATIVE mg/dL
Ketones, ur: NEGATIVE mg/dL
Nitrite: NEGATIVE
Protein, ur: 100 mg/dL — AB
Specific Gravity, Urine: 1.025 (ref 1.005–1.030)
pH: 6 (ref 5.0–8.0)

## 2019-01-10 LAB — PROTEIN / CREATININE RATIO, URINE
Creatinine, Urine: 280.26 mg/dL
Protein Creatinine Ratio: 0.18 mg/mg{Cre} — ABNORMAL HIGH (ref 0.00–0.15)
Total Protein, Urine: 50 mg/dL

## 2019-01-10 MED ORDER — BUTALBITAL-APAP-CAFFEINE 50-325-40 MG PO TABS
2.0000 | ORAL_TABLET | Freq: Once | ORAL | Status: AC
  Administered 2019-01-10: 2 via ORAL
  Filled 2019-01-10: qty 2

## 2019-01-10 NOTE — MAU Note (Signed)
Pt stated she woke up with a headache and some blurred vision. Is on labetalol  for hight b/p.took b/p and it was 140/101. Then took her meds  waited an hour and rechecked ant it 139/104. OB told her to come to MAU for further evaluation. Still c/o dull headache. Good fetal movement reported.

## 2019-01-10 NOTE — MAU Provider Note (Signed)
History     CSN: [id number]  Arrival date and time: 01/10/19 1243  Chief Complaint  Patient presents with  . Hypertension   G3P1011 @35 .5 wks presenting with elevated BP and HA. Woke this am with frontal HA, rates 5/10. Has not taken anything for it. She checked her BP at home and was elevated. Has intermittent floaters. Denies CP, SOB, RUQ pain. +FM. No VB, LOF, ctx. Hx of CHTN on Labetalol, took dose this am.    OB History    Gravida  3   Para  1   Term  1   Preterm      AB  1   Living  1     SAB  1   TAB      Ectopic      Multiple      Live Births  1           Past Medical History:  Diagnosis Date  . Anxiety    doing good now  . Asthma   . Headache(784.0)   . HSV infection   . Hypertension   . Infection    UTI  . Lupus (HCC)    dx age 33  . Pulmonary embolism (HCC) 06/4/82011  . 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 ADENOIDECTOMY    . TONSILLECTOMY AND ADENOIDECTOMY  1998  . WISDOM TOOTH EXTRACTION      Family History  Problem Relation Age of Onset  . Diabetes Maternal Aunt   . Cancer Maternal Grandmother 72       COLON CA  . Diabetes Maternal Grandmother   . Hypertension Maternal Grandfather   . Cancer Maternal Grandfather        prostate  . Asthma Mother   . Asthma Brother   . Cancer Paternal Grandmother        breast  . Lupus Paternal Grandmother   . Lupus Paternal Aunt   . Stroke Paternal Aunt   . Anesthesia problems Neg Hx   . Hypotension Neg Hx   . Malignant hyperthermia Neg Hx   . Pseudochol deficiency Neg Hx     Social History   Tobacco Use  . Smoking status: Never Smoker  . Smokeless tobacco: Never Used  Substance Use Topics  . Alcohol use: No  . Drug use: No    Allergies:  Allergies  Allergen Reactions  . Bactrim Anaphylaxis and Hives  . Hydrocodone-Acetaminophen Anaphylaxis  . Nifedipine Swelling and Anaphylaxis    Swelling  of tongue  . Pineapple Itching  . Prednisone Anaphylaxis and Hives    Has been on Dexamethasone without issue.  . Shellfish Allergy Itching  . Sulfamethoxazole-Trimethoprim Other (See Comments)    Complications due to lupus  . Nsaids Other (See Comments)    Has Lupus, reccommended to avoid NSAIDs  . Other Itching    Shrimp:throat itching "can eat other shellfish"  . Vicodin [Hydrocodone-Acetaminophen] Hives and Swelling    Can take Percocet and Tylenol without reaction.    No medications prior to admission.    Review of Systems  Eyes: Positive for visual disturbance.  Respiratory: Negative for shortness of breath.   Cardiovascular: Negative for chest pain.  Gastrointestinal: Negative for abdominal pain.  Genitourinary: Negative for vaginal discharge.  Neurological: Positive for headaches.   Physical Exam   Blood pressure (!) 137/93, pulse (!) 107, temperature 98.2 F (36.8 C), resp. rate 18, height 5'  8.5" (1.74 m), weight (!) 149.7 kg, last menstrual period 04/18/2018. Patient Vitals for the past 24 hrs:  BP Temp Pulse Resp Height Weight  01/10/19 1531 (!) 137/93 - (!) 107 - - -  01/10/19 1516 (!) 133/94 - 100 - - -  01/10/19 1501 134/86 - 99 - - -  01/10/19 1446 (!) 126/96 - (!) 101 - - -  01/10/19 1431 (!) 130/92 - (!) 104 - - -  01/10/19 1416 139/89 - (!) 103 - - -  01/10/19 1401 (!) 138/92 - (!) 116 - - -  01/10/19 1346 (!) 138/96 - (!) 108 - - -  01/10/19 1332 (!) 143/92 98.2 F (36.8 C) (!) 112 18 5' 8.5" (1.74 m) (!) 149.7 kg  01/10/19 1330 (!) 143/92 - (!) 112 - - -    Physical Exam  Nursing note and vitals reviewed. Constitutional: She is oriented to person, place, and time. She appears well-developed and well-nourished.  HENT:  Head: Normocephalic and atraumatic.  Neck: Normal range of motion.  Respiratory: Effort normal. No respiratory distress.  Musculoskeletal: Normal range of motion.        General: No edema.  Neurological: She is alert and  oriented to person, place, and time.  Psychiatric: She has a normal mood and affect.  EFM: 150 bpm, mod variability, + accels, no decels Toco: none  Results for orders placed or performed during the hospital encounter of 01/10/19 (from the past 24 hour(s))  Protein / creatinine ratio, urine     Status: Abnormal   Collection Time: 01/10/19  1:33 PM  Result Value Ref Range   Creatinine, Urine 280.26 mg/dL   Total Protein, Urine 50 mg/dL   Protein Creatinine Ratio 0.18 (H) 0.00 - 0.15 mg/mg[Cre]  Urinalysis, Routine w reflex microscopic     Status: Abnormal   Collection Time: 01/10/19  1:33 PM  Result Value Ref Range   Color, Urine YELLOW YELLOW   APPearance CLEAR CLEAR   Specific Gravity, Urine 1.025 1.005 - 1.030   pH 6.0 5.0 - 8.0   Glucose, UA NEGATIVE NEGATIVE mg/dL   Hgb urine dipstick SMALL (A) NEGATIVE   Bilirubin Urine NEGATIVE NEGATIVE   Ketones, ur NEGATIVE NEGATIVE mg/dL   Protein, ur 811100 (A) NEGATIVE mg/dL   Nitrite NEGATIVE NEGATIVE   Leukocytes,Ua SMALL (A) NEGATIVE   RBC / HPF 0-5 0 - 5 RBC/hpf   WBC, UA 0-5 0 - 5 WBC/hpf   Bacteria, UA RARE (A) NONE SEEN   Squamous Epithelial / LPF 6-10 0 - 5   Mucus PRESENT   CBC     Status: Abnormal   Collection Time: 01/10/19  2:00 PM  Result Value Ref Range   WBC 5.5 4.0 - 10.5 K/uL   RBC 4.00 3.87 - 5.11 MIL/uL   Hemoglobin 11.0 (L) 12.0 - 15.0 g/dL   HCT 91.433.4 (L) 78.236.0 - 95.646.0 %   MCV 83.5 80.0 - 100.0 fL   MCH 27.5 26.0 - 34.0 pg   MCHC 32.9 30.0 - 36.0 g/dL   RDW 21.313.2 08.611.5 - 57.815.5 %   Platelets 320 150 - 400 K/uL   nRBC 0.0 0.0 - 0.2 %  Comprehensive metabolic panel     Status: Abnormal   Collection Time: 01/10/19  2:00 PM  Result Value Ref Range   Sodium 133 (L) 135 - 145 mmol/L   Potassium 3.4 (L) 3.5 - 5.1 mmol/L   Chloride 105 98 - 111 mmol/L   CO2 20 (L) 22 -  32 mmol/L   Glucose, Bld 109 (H) 70 - 99 mg/dL   BUN 6 6 - 20 mg/dL   Creatinine, Ser 0.53 0.44 - 1.00 mg/dL   Calcium 9.2 8.9 - 10.3 mg/dL   Total  Protein 6.8 6.5 - 8.1 g/dL   Albumin 2.6 (L) 3.5 - 5.0 g/dL   AST 14 (L) 15 - 41 U/L   ALT 11 0 - 44 U/L   Alkaline Phosphatase 146 (H) 38 - 126 U/L   Total Bilirubin 0.2 (L) 0.3 - 1.2 mg/dL   GFR calc non Af Amer >60 >60 mL/min   GFR calc Af Amer >60 >60 mL/min   Anion gap 8 5 - 15   MAU Course  Procedures Orders Placed This Encounter  Procedures  . Protein / creatinine ratio, urine    Standing Status:   Standing    Number of Occurrences:   1  . Urinalysis, Routine w reflex microscopic    Standing Status:   Standing    Number of Occurrences:   1  . CBC    Standing Status:   Standing    Number of Occurrences:   1  . Comprehensive metabolic panel    Standing Status:   Standing    Number of Occurrences:   1  . Discharge patient    Order Specific Question:   Discharge disposition    Answer:   01-Home or Self Care [1]    Order Specific Question:   Discharge patient date    Answer:   01/10/2019   Meds ordered this encounter  Medications  . butalbital-acetaminophen-caffeine (FIORICET) 50-325-40 MG per tablet 2 tablet   MDM Chart review: pregnancy complicated by Lupus, hx recurrent PE on Lovenox, CHTN on Labetalol, obesity. BP stable-no severe range, HA improved, no evidence of superimposed PEC. Consult with Dr. Ilda Basset, recommends no change to Labetalol and f/u in office. Stable for discharge home.   Assessment and Plan   1. [redacted] weeks gestation of pregnancy   2. Chronic hypertension affecting pregnancy   3. Pregnancy headache in third trimester   4. NST (non-stress test) reactive    Discharge home [facility] next week as scheduled PEC precautions Magnesium 400 mg po daily for HA  Allergies as of 01/10/2019      Reactions   Bactrim Anaphylaxis, Hives   Hydrocodone-acetaminophen Anaphylaxis   Nifedipine Swelling, Anaphylaxis   Swelling of tongue   Pineapple Itching   Prednisone Anaphylaxis, Hives   Has been on Dexamethasone without issue.    Shellfish Allergy Itching   Sulfamethoxazole-trimethoprim Other (See Comments)   Complications due to lupus   Nsaids Other (See Comments)   Has Lupus, reccommended to avoid NSAIDs   Other Itching   Shrimp:throat itching "can eat other shellfish"   Vicodin [hydrocodone-acetaminophen] Hives, Swelling   Can take Percocet and Tylenol without reaction.      Medication List    TAKE these medications   albuterol 108 (90 Base) MCG/ACT inhaler Commonly known as: VENTOLIN HFA Inhale 2 puffs into the lungs every 6 (six) hours as needed for wheezing or shortness of breath.   albuterol (2.5 MG/3ML) 0.083% nebulizer solution Commonly known as: PROVENTIL Take 2.5 mg by nebulization every 6 (six) hours as needed for wheezing or shortness of breath.   aspirin EC 81 MG tablet Take 1 tablet (81 mg total) by mouth daily.   Bonjesta 20-20 MG Tbcr Generic drug: Doxylamine-Pyridoxine ER Bonjesta 20 mg-20 mg tablet,immediate and delay  release  Take 1 tablet twice a day by oral route.   cholecalciferol 1000 units tablet Commonly known as: VITAMIN D Take 1,000 Units by mouth daily.   enoxaparin 40 MG/0.4ML injection Commonly known as: LOVENOX Inject 1.5 mLs (150 mg total) into the skin every 12 (twelve) hours.   hydroxychloroquine 200 MG tablet Commonly known as: PLAQUENIL Take 100 mg by mouth 2 (two) times daily.   labetalol 200 MG tablet Commonly known as: NORMODYNE Take 2 tablets (400 mg total) by mouth 2 (two) times daily.   metoCLOPramide 10 MG tablet Commonly known as: REGLAN Take 1 tablet (10 mg total) by mouth every 6 (six) hours.   multivitamin-prenatal 27-0.8 MG Tabs tablet Take 1 tablet by mouth daily at 12 noon.   ondansetron 4 MG tablet Commonly known as: ZOFRAN Take 8 mg by mouth 2 (two) times daily.   promethazine 25 MG tablet Commonly known as: PHENERGAN Take 0.5-1 tablets (12.5-25 mg total) by mouth every 6 (six) hours as needed.   terbutaline 2.5 MG  tablet Commonly known as: BRETHINE Take 1 tablet (2.5 mg total) by mouth every 6 (six) hours as needed (if more than 6 contractions in 1 hour).   terconazole 0.4 % vaginal cream Commonly known as: TERAZOL 7 terconazole 0.4 % vaginal cream  Insert 1 applicatorful every day by vaginal route for 7 days.   valACYclovir 500 MG tablet Commonly known as: VALTREX Take 1 tablet (500 mg total) by mouth 2 (two) times daily.       Donette LarryMelanie Shonnie Poudrier, [name] 01/10/2019, 4:49 PM

## 2019-01-10 NOTE — Discharge Instructions (Signed)
Hypertension During Pregnancy ° °Hypertension is also called high blood pressure. High blood pressure means that the force of your blood moving in your body is too strong. When you are pregnant, this condition should be watched carefully. It can cause problems for you and your baby. °Follow these instructions at home: °Eating and drinking ° °· Drink enough fluid to keep your pee (urine) pale yellow. °· Avoid caffeine. °Lifestyle °· Do not use any products that contain nicotine or tobacco, such as cigarettes and e-cigarettes. If you need help quitting, ask your doctor. °· Do not use alcohol or drugs. °· Avoid stress. °· Rest and get plenty of sleep. °General instructions °· Take over-the-counter and prescription medicines only as told by your doctor. °· While lying down, lie on your left side. This keeps pressure off your major blood vessels. °· While sitting or lying down, raise (elevate) your feet. Try putting some pillows under your lower legs. °· Exercise regularly. Ask your doctor what kinds of exercise are best for you. °· Keep all prenatal and follow-up visits as told by your doctor. This is important. °Contact a doctor if: °· You have symptoms that your doctor told you to watch for, such as: °? Throwing up (vomiting). °? Feeling sick to your stomach (nausea). °? Headache. °Get help right away if you have: °· Very bad belly pain that does not get better with treatment. °· A very bad headache that does not get better. °· Throwing up that does not get better with treatment. °· Sudden, fast weight gain. °· Sudden swelling in your hands, ankles, or face. °· Bleeding from your vagina. °· Blood in your pee. °· Fewer movements from your baby than usual. °· Blurry vision. °· Double vision. °· Muscle twitching. °· Sudden muscle tightening (spasms). °· Trouble breathing. °· Blue fingernails or lips. °Summary °· Hypertension is also called high blood pressure. High blood pressure means that the force of your blood moving  in your body is too strong. °· When you are pregnant, this condition should be watched carefully. It can cause problems for you and your baby. °· Get help right away if you have symptoms that your doctor told you to watch for. °This information is not intended to replace advice given to you by your health care provider. Make sure you discuss any questions you have with your health care provider. °Document Released: 08/12/2010 Document Revised: 06/26/2017 Document Reviewed: 03/21/2016 °Elsevier Interactive Patient Education © 2019 [employer] ° °

## 2019-01-11 ENCOUNTER — Other Ambulatory Visit: Payer: Self-pay

## 2019-01-11 ENCOUNTER — Inpatient Hospital Stay (HOSPITAL_COMMUNITY)
Admission: AD | Admit: 2019-01-11 | Discharge: 2019-01-17 | DRG: 806 | Disposition: A | Discharge status: Home / Self Care | Payer: 59 | Attending: Obstetrics & Gynecology | Admitting: Obstetrics & Gynecology

## 2019-01-11 DIAGNOSIS — Z3490 Encounter for supervision of normal pregnancy, unspecified, unspecified trimester: Secondary | ICD-10-CM

## 2019-01-11 DIAGNOSIS — O1002 Pre-existing essential hypertension complicating childbirth: Secondary | ICD-10-CM | POA: Diagnosis present

## 2019-01-11 DIAGNOSIS — Z1159 Encounter for screening for other viral diseases: Secondary | ICD-10-CM

## 2019-01-11 DIAGNOSIS — A6000 Herpesviral infection of urogenital system, unspecified: Secondary | ICD-10-CM | POA: Diagnosis present

## 2019-01-11 DIAGNOSIS — O9832 Other infections with a predominantly sexual mode of transmission complicating childbirth: Secondary | ICD-10-CM | POA: Diagnosis present

## 2019-01-11 DIAGNOSIS — O4700 False labor before 37 completed weeks of gestation, unspecified trimester: Secondary | ICD-10-CM | POA: Diagnosis present

## 2019-01-11 DIAGNOSIS — O99354 Diseases of the nervous system complicating childbirth: Secondary | ICD-10-CM | POA: Diagnosis present

## 2019-01-11 DIAGNOSIS — O99214 Obesity complicating childbirth: Secondary | ICD-10-CM | POA: Diagnosis present

## 2019-01-11 DIAGNOSIS — O10913 Unspecified pre-existing hypertension complicating pregnancy, third trimester: Secondary | ICD-10-CM | POA: Diagnosis present

## 2019-01-11 DIAGNOSIS — O9912 Other diseases of the blood and blood-forming organs and certain disorders involving the immune mechanism complicating childbirth: Secondary | ICD-10-CM | POA: Diagnosis present

## 2019-01-11 DIAGNOSIS — D6862 Lupus anticoagulant syndrome: Secondary | ICD-10-CM | POA: Diagnosis present

## 2019-01-11 DIAGNOSIS — O99344 Other mental disorders complicating childbirth: Secondary | ICD-10-CM | POA: Diagnosis present

## 2019-01-11 DIAGNOSIS — Z3A36 36 weeks gestation of pregnancy: Secondary | ICD-10-CM

## 2019-01-11 DIAGNOSIS — O479 False labor, unspecified: Secondary | ICD-10-CM

## 2019-01-11 DIAGNOSIS — O114 Pre-existing hypertension with pre-eclampsia, complicating childbirth: Principal | ICD-10-CM | POA: Diagnosis present

## 2019-01-11 DIAGNOSIS — G43909 Migraine, unspecified, not intractable, without status migrainosus: Secondary | ICD-10-CM | POA: Diagnosis present

## 2019-01-11 DIAGNOSIS — Z7982 Long term (current) use of aspirin: Secondary | ICD-10-CM

## 2019-01-11 DIAGNOSIS — F419 Anxiety disorder, unspecified: Secondary | ICD-10-CM | POA: Diagnosis present

## 2019-01-11 DIAGNOSIS — O9081 Anemia of the puerperium: Secondary | ICD-10-CM | POA: Diagnosis present

## 2019-01-11 DIAGNOSIS — Z86711 Personal history of pulmonary embolism: Secondary | ICD-10-CM

## 2019-01-12 ENCOUNTER — Encounter (HOSPITAL_COMMUNITY): Payer: Self-pay

## 2019-01-12 DIAGNOSIS — O99354 Diseases of the nervous system complicating childbirth: Secondary | ICD-10-CM | POA: Diagnosis present

## 2019-01-12 DIAGNOSIS — A6000 Herpesviral infection of urogenital system, unspecified: Secondary | ICD-10-CM | POA: Diagnosis present

## 2019-01-12 DIAGNOSIS — Z3689 Encounter for other specified antenatal screening: Secondary | ICD-10-CM | POA: Diagnosis not present

## 2019-01-12 DIAGNOSIS — D6862 Lupus anticoagulant syndrome: Secondary | ICD-10-CM | POA: Diagnosis present

## 2019-01-12 DIAGNOSIS — F419 Anxiety disorder, unspecified: Secondary | ICD-10-CM | POA: Diagnosis present

## 2019-01-12 DIAGNOSIS — O9081 Anemia of the puerperium: Secondary | ICD-10-CM | POA: Diagnosis present

## 2019-01-12 DIAGNOSIS — O114 Pre-existing hypertension with pre-eclampsia, complicating childbirth: Secondary | ICD-10-CM | POA: Diagnosis present

## 2019-01-12 DIAGNOSIS — Z7982 Long term (current) use of aspirin: Secondary | ICD-10-CM | POA: Diagnosis not present

## 2019-01-12 DIAGNOSIS — O10913 Unspecified pre-existing hypertension complicating pregnancy, third trimester: Secondary | ICD-10-CM

## 2019-01-12 DIAGNOSIS — O99344 Other mental disorders complicating childbirth: Secondary | ICD-10-CM | POA: Diagnosis present

## 2019-01-12 DIAGNOSIS — Z86711 Personal history of pulmonary embolism: Secondary | ICD-10-CM | POA: Diagnosis not present

## 2019-01-12 DIAGNOSIS — O9912 Other diseases of the blood and blood-forming organs and certain disorders involving the immune mechanism complicating childbirth: Secondary | ICD-10-CM | POA: Diagnosis present

## 2019-01-12 DIAGNOSIS — Z3A36 36 weeks gestation of pregnancy: Secondary | ICD-10-CM | POA: Diagnosis not present

## 2019-01-12 DIAGNOSIS — O99214 Obesity complicating childbirth: Secondary | ICD-10-CM | POA: Diagnosis present

## 2019-01-12 DIAGNOSIS — G43909 Migraine, unspecified, not intractable, without status migrainosus: Secondary | ICD-10-CM | POA: Diagnosis present

## 2019-01-12 DIAGNOSIS — Z1159 Encounter for screening for other viral diseases: Secondary | ICD-10-CM | POA: Diagnosis not present

## 2019-01-12 DIAGNOSIS — O9832 Other infections with a predominantly sexual mode of transmission complicating childbirth: Secondary | ICD-10-CM | POA: Diagnosis present

## 2019-01-12 DIAGNOSIS — O1002 Pre-existing essential hypertension complicating childbirth: Secondary | ICD-10-CM | POA: Diagnosis present

## 2019-01-12 DIAGNOSIS — O479 False labor, unspecified: Secondary | ICD-10-CM | POA: Diagnosis not present

## 2019-01-12 HISTORY — DX: Unspecified pre-existing hypertension complicating pregnancy, third trimester: O10.913

## 2019-01-12 LAB — CBC
HCT: 32.1 % — ABNORMAL LOW (ref 36.0–46.0)
Hemoglobin: 10.5 g/dL — ABNORMAL LOW (ref 12.0–15.0)
MCH: 27.7 pg (ref 26.0–34.0)
MCHC: 32.7 g/dL (ref 30.0–36.0)
MCV: 84.7 fL (ref 80.0–100.0)
Platelets: 287 10*3/uL (ref 150–400)
RBC: 3.79 MIL/uL — ABNORMAL LOW (ref 3.87–5.11)
RDW: 13.4 % (ref 11.5–15.5)
WBC: 6.4 10*3/uL (ref 4.0–10.5)
nRBC: 0 % (ref 0.0–0.2)

## 2019-01-12 LAB — COMPREHENSIVE METABOLIC PANEL
ALT: 13 U/L (ref 0–44)
AST: 14 U/L — ABNORMAL LOW (ref 15–41)
Albumin: 2.5 g/dL — ABNORMAL LOW (ref 3.5–5.0)
Alkaline Phosphatase: 135 U/L — ABNORMAL HIGH (ref 38–126)
Anion gap: 10 (ref 5–15)
BUN: 6 mg/dL (ref 6–20)
CO2: 21 mmol/L — ABNORMAL LOW (ref 22–32)
Calcium: 9.4 mg/dL (ref 8.9–10.3)
Chloride: 105 mmol/L (ref 98–111)
Creatinine, Ser: 0.62 mg/dL (ref 0.44–1.00)
GFR calc Af Amer: >60 mL/min (ref >60)
GFR calc non Af Amer: >60 mL/min (ref >60)
Glucose, Bld: 107 mg/dL — ABNORMAL HIGH (ref 70–99)
Potassium: 3.3 mmol/L — ABNORMAL LOW (ref 3.5–5.1)
Sodium: 136 mmol/L (ref 135–145)
Total Bilirubin: <0.1 mg/dL — ABNORMAL LOW (ref 0.3–1.2)
Total Protein: 6.4 g/dL — ABNORMAL LOW (ref 6.5–8.1)

## 2019-01-12 LAB — SARS CORONAVIRUS 2 BY RT PCR (HOSPITAL ORDER, PERFORMED IN ~~LOC~~ HOSPITAL LAB): SARS Coronavirus 2: NEGATIVE

## 2019-01-12 LAB — PROTEIN / CREATININE RATIO, URINE
Creatinine, Urine: 163.89 mg/dL
Protein Creatinine Ratio: 0.16 mg/mg{Cre} — ABNORMAL HIGH (ref 0.00–0.15)
Total Protein, Urine: 27 mg/dL

## 2019-01-12 LAB — GLUCOSE, CAPILLARY: Glucose-Capillary: 152 mg/dL — ABNORMAL HIGH (ref 70–99)

## 2019-01-12 LAB — TYPE AND SCREEN
ABO/RH(D): O POS
Antibody Screen: NEGATIVE

## 2019-01-12 LAB — RPR: RPR Ser Ql: NONREACTIVE

## 2019-01-12 MED ORDER — ASPIRIN EC 81 MG PO TBEC
81.0000 mg | DELAYED_RELEASE_TABLET | Freq: Every day | ORAL | Status: DC
  Administered 2019-01-13 – 2019-01-14 (×2): 81 mg via ORAL
  Filled 2019-01-12 (×4): qty 1

## 2019-01-12 MED ORDER — LABETALOL HCL 5 MG/ML IV SOLN: 40.0000 mg | INTRAVENOUS | Status: DC | PRN

## 2019-01-12 MED ORDER — HYDROXYCHLOROQUINE SULFATE 200 MG PO TABS
200.0000 mg | ORAL_TABLET | Freq: Every day | ORAL | Status: DC
  Administered 2019-01-12 – 2019-01-16 (×5): 200 mg via ORAL
  Filled 2019-01-12 (×8): qty 1

## 2019-01-12 MED ORDER — ALBUTEROL SULFATE (2.5 MG/3ML) 0.083% IN NEBU: 3.0000 mL | INHALATION_SOLUTION | Freq: Four times a day (QID) | RESPIRATORY_TRACT | Status: DC | PRN

## 2019-01-12 MED ORDER — ONDANSETRON HCL 4 MG PO TABS
8.0000 mg | ORAL_TABLET | Freq: Two times a day (BID) | ORAL | Status: DC
  Administered 2019-01-12 – 2019-01-14 (×4): 8 mg via ORAL
  Filled 2019-01-12 (×5): qty 2

## 2019-01-12 MED ORDER — LABETALOL HCL 5 MG/ML IV SOLN: 80.0000 mg | INTRAVENOUS | Status: DC | PRN

## 2019-01-12 MED ORDER — CALCIUM CARBONATE ANTACID 500 MG PO CHEW
2.0000 | CHEWABLE_TABLET | ORAL | Status: DC | PRN
  Administered 2019-01-12 – 2019-01-13 (×3): 400 mg via ORAL
  Filled 2019-01-12 (×3): qty 2

## 2019-01-12 MED ORDER — ZOLPIDEM TARTRATE 5 MG PO TABS
5.0000 mg | ORAL_TABLET | Freq: Every evening | ORAL | Status: DC | PRN
  Administered 2019-01-12 – 2019-01-13 (×2): 5 mg via ORAL
  Filled 2019-01-12 (×2): qty 1

## 2019-01-12 MED ORDER — DIPHENHYDRAMINE HCL 50 MG/ML IJ SOLN
25.0000 mg | Freq: Once | INTRAMUSCULAR | Status: AC
  Administered 2019-01-12: 25 mg via INTRAVENOUS
  Filled 2019-01-12: qty 1

## 2019-01-12 MED ORDER — HYDROXYZINE HCL 50 MG PO TABS
50.0000 mg | ORAL_TABLET | Freq: Three times a day (TID) | ORAL | Status: DC | PRN
  Administered 2019-01-12 – 2019-01-13 (×2): 50 mg via ORAL
  Filled 2019-01-12 (×5): qty 1

## 2019-01-12 MED ORDER — DEXAMETHASONE SODIUM PHOSPHATE 10 MG/ML IJ SOLN
10.0000 mg | Freq: Once | INTRAMUSCULAR | Status: AC
  Administered 2019-01-12: 10 mg via INTRAVENOUS
  Filled 2019-01-12: qty 1

## 2019-01-12 MED ORDER — ENOXAPARIN SODIUM 150 MG/ML ~~LOC~~ SOLN
150.0000 mg | Freq: Two times a day (BID) | SUBCUTANEOUS | Status: DC
  Administered 2019-01-12 (×2): 150 mg via SUBCUTANEOUS
  Filled 2019-01-12 (×3): qty 1

## 2019-01-12 MED ORDER — METOCLOPRAMIDE HCL 10 MG PO TABS
10.0000 mg | ORAL_TABLET | Freq: Four times a day (QID) | ORAL | Status: DC
  Administered 2019-01-12 – 2019-01-15 (×7): 10 mg via ORAL
  Filled 2019-01-12 (×18): qty 1

## 2019-01-12 MED ORDER — HYDRALAZINE HCL 20 MG/ML IJ SOLN: 10.0000 mg | INTRAMUSCULAR | Status: DC | PRN

## 2019-01-12 MED ORDER — PRENATAL MULTIVITAMIN CH
1.0000 | ORAL_TABLET | Freq: Every day | ORAL | Status: DC
  Administered 2019-01-12 – 2019-01-13 (×2): 1 via ORAL
  Filled 2019-01-12 (×2): qty 1

## 2019-01-12 MED ORDER — LACTATED RINGERS IV SOLN
INTRAVENOUS | Status: DC
  Administered 2019-01-12 (×3): via INTRAVENOUS

## 2019-01-12 MED ORDER — PRENATAL MULTIVITAMIN CH: 1.0000 | ORAL_TABLET | Freq: Every day | ORAL | Status: DC

## 2019-01-12 MED ORDER — ENOXAPARIN SODIUM 40 MG/0.4ML ~~LOC~~ SOLN
150.0000 mg | Freq: Two times a day (BID) | SUBCUTANEOUS | Status: DC
  Filled 2019-01-12: qty 1.6

## 2019-01-12 MED ORDER — ENOXAPARIN SODIUM 40 MG/0.4ML ~~LOC~~ SOLN: 150.0000 mg | Freq: Two times a day (BID) | SUBCUTANEOUS | Status: DC

## 2019-01-12 MED ORDER — DOCUSATE SODIUM 100 MG PO CAPS
100.0000 mg | ORAL_CAPSULE | Freq: Every day | ORAL | Status: DC
  Administered 2019-01-12 – 2019-01-13 (×2): 100 mg via ORAL
  Filled 2019-01-12 (×2): qty 1

## 2019-01-12 MED ORDER — VALACYCLOVIR HCL 500 MG PO TABS
500.0000 mg | ORAL_TABLET | Freq: Two times a day (BID) | ORAL | Status: DC
  Administered 2019-01-12 – 2019-01-14 (×6): 500 mg via ORAL
  Filled 2019-01-12 (×6): qty 1

## 2019-01-12 MED ORDER — LABETALOL HCL 5 MG/ML IV SOLN
20.0000 mg | INTRAVENOUS | Status: DC | PRN
  Administered 2019-01-12: 20 mg via INTRAVENOUS
  Filled 2019-01-12: qty 4

## 2019-01-12 MED ORDER — BUTALBITAL-APAP-CAFFEINE 50-325-40 MG PO TABS
2.0000 | ORAL_TABLET | Freq: Four times a day (QID) | ORAL | Status: DC | PRN
  Administered 2019-01-12 – 2019-01-13 (×4): 2 via ORAL
  Filled 2019-01-12 (×4): qty 2

## 2019-01-12 MED ORDER — HYDROXYCHLOROQUINE SULFATE 200 MG PO TABS
100.0000 mg | ORAL_TABLET | Freq: Two times a day (BID) | ORAL | Status: DC
  Filled 2019-01-12: qty 0.5

## 2019-01-12 MED ORDER — LABETALOL HCL 200 MG PO TABS
400.0000 mg | ORAL_TABLET | Freq: Two times a day (BID) | ORAL | Status: DC
  Administered 2019-01-12 – 2019-01-16 (×10): 400 mg via ORAL
  Filled 2019-01-12 (×10): qty 2

## 2019-01-12 NOTE — Progress Notes (Signed)
Hayley Webb is a 33 y.o. female patient, G3P1 at [redacted]w[redacted]d. Patient was admitted for contractions and severe range blood pressures.   1. Threatened labor, antepartum   2. Chronic hypertension in obstetric context in third trimester    Past Medical History:  Diagnosis Date  . Anxiety    doing good now  . Asthma   . Headache(784.0)   . HSV infection   . Hypertension   . Infection    UTI  . Lupus ([place])    dx age 40  . Pulmonary embolism ([place]) 06/4/82011  . STD (sexually transmitted disease) 02/2009   POSITIVE GC   Current Facility-Administered Medications  Medication Dose Route Frequency Provider Last Rate Last Dose  . albuterol (PROVENTIL) (2.5 MG/3ML) 0.083% nebulizer solution 3 mL  3 mL Inhalation Q6H PRN Noralyn Pick, FNP      . aspirin EC tablet 81 mg  81 mg Oral Daily [place], [place], [place]      . butalbital-acetaminophen-caffeine (FIORICET) [phone] MG per tablet 2 tablet  2 tablet Oral Q6H PRN Gavin Pound, [name]   2 tablet at 01/12/19 0854  . calcium carbonate (TUMS - dosed in mg elemental calcium) chewable tablet 400 mg of elemental calcium  2 tablet Oral Q4H PRN Noralyn Pick, FNP   400 mg of elemental calcium at 01/12/19 1113  . docusate sodium (COLACE) capsule 100 mg  100 mg Oral Daily [place], [place], FNP   100 mg at 01/12/19 1046  . enoxaparin (LOVENOX) injection 150 mg  150 mg Subcutaneous Q12H Dillard, Naima, MD   150 mg at 01/12/19 1056  . labetalol (NORMODYNE) injection 20 mg  20 mg Intravenous PRN Gavin Pound, [name]   20 mg at 01/12/19 0225   And  . labetalol (NORMODYNE) injection 40 mg  40 mg Intravenous PRN Gavin Pound, [name]       And  . labetalol (NORMODYNE) injection 80 mg  80 mg Intravenous PRN Gavin Pound, [name]       And  . hydrALAZINE (APRESOLINE) injection 10 mg  10 mg Intravenous PRN Gavin Pound, [name]      . hydroxychloroquine (PLAQUENIL) tablet 200 mg  200 mg Oral Daily [place], [place], FNP   200 mg at 01/12/19 1225  . hydrOXYzine (ATARAX/VISTARIL) tablet 50  mg  50 mg Oral TID PRN Noralyn Pick, FNP   50 mg at 01/12/19 1229  . labetalol (NORMODYNE) tablet 400 mg  400 mg Oral BID Noralyn Pick, FNP   400 mg at 01/12/19 1046  . lactated ringers infusion   Intravenous Continuous [place], [place] 125 mL/hr at 01/12/19 1700    . metoCLOPramide (REGLAN) tablet 10 mg  10 mg Oral Q6H [place], Jade, FNP      . ondansetron [facility]) tablet 8 mg  8 mg Oral BID Noralyn Pick, [place]      . prenatal multivitamin tablet 1 tablet  1 tablet Oral Q1200 [employer], Luvenia Starch, [place]   1 tablet at 01/12/19 1112  . valACYclovir (VALTREX) tablet 500 mg  500 mg Oral BID Noralyn Pick, FNP   500 mg at 01/12/19 1046  . zolpidem (AMBIEN) tablet 5 mg  5 mg Oral QHS PRN Noralyn Pick, FNP       Allergies  Allergen Reactions  . Bactrim Anaphylaxis and Hives  . Hydrocodone-Acetaminophen Anaphylaxis  . Nifedipine Swelling and Anaphylaxis    Swelling of tongue  . Pineapple Itching  . Prednisone Anaphylaxis and Hives    Has been on Dexamethasone without issue.  . Shellfish  Allergy Itching  . Sulfamethoxazole-Trimethoprim Other (See Comments)    Complications due to lupus  . Nsaids Other (See Comments)    Has Lupus, reccommended to avoid NSAIDs  . Other Itching    Shrimp:throat itching "can eat other shellfish"  . Vicodin [Hydrocodone-Acetaminophen] Hives and Swelling    Can take Percocet and Tylenol without reaction.   Active Problems:   Threatened preterm labor   Chronic hypertension complicating or reason for care during pregnancy, third trimester  Subjective  Patient reports feeling well. She reports intermittent mild cramping but states it is less strong than she reported on previous admission. On tocometry, occasional contractions noted with uterine irritability. She reports good fetal movement and denies vaginal bleeding. She reports moderate headache and has taken fioricet for it. Fioricet reduces headache but does not entirely relieve pain.   Objective: Vital signs:  (most recent): Blood pressure 123/71, pulse 98, temperature 97.7 F (36.5 C), temperature source Oral, resp. rate 17, height 5' 8.5" (1.74 m), weight (!) 149.7 kg, last menstrual period 04/18/2018, SpO2 99 %.   Lungs:  Normal effort.  She is not in respiratory distress.   Extremities: Normal range of motion.   Neurological: Patient is alert.   Pupils:  Pupils are equal, round, and reactive to light.   Skin:  Warm and dry.     Vitals:   01/12/19 1605 01/12/19 1610 01/12/19 1620 01/12/19 1746  BP:    123/71  Pulse:    98  Resp:      Temp:      TempSrc:      SpO2: 100% 100% 99%   Weight:      Height:       Physical Exam  Nursing note and vitals reviewed. Constitutional: She is oriented to person, place, and time. She appears well-developed and well-nourished.  HENT:  Head: Normocephalic and atraumatic.  Eyes: Pupils are equal, round, and reactive to light.  Respiratory: Effort normal. No respiratory distress.  Musculoskeletal: Normal range of motion.  Neurological: She is alert and oriented to person, place, and time.  Skin: Skin is warm and dry.  Psychiatric: She has a normal mood and affect. Her behavior is normal. Judgment and thought content normal.   Assessment & Plan 33 y.o. 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 Grace BushyBooker:  -Switch patient to therapeutic heparin   -If patient experiences intractable headache, consider delivery otherwise deliver at 37  weeks Will give report to Dr. Normand Sloopillard this am   Janeece RiggersEllis K Mauriana Dann 01/12/2019

## 2019-01-12 NOTE — Progress Notes (Signed)
This afternoon pt c/o increased d/c with brown blood. Nursing monitored. Pt states one other episode this afternoon of some pink-tinged d/c. [facility] aware. No new orders at this time. Marry Guan

## 2019-01-12 NOTE — MAU Note (Signed)
Pt reports contractions every 2-3 mins since 1830. No LOF or vaginal bleeding. Reports good fetal movement. Cervix 2.5cm on last exam. Pt on labetalol for BPs. Denies HA or vision changes.

## 2019-01-12 NOTE — Consult Note (Signed)
MFM Consult note Telemedicine  Requesting provider: Christophe Louis, MD Date of Service 01/12/19 Reason for consult: Chronic hypertension with severe range blood pressure.  Hayley Webb is a 33 yo G3P1 who is admitted for elevated severe range blood pressure. She is seen at the request of Dr. Christophe Louis.  I met with Hayley Webb via telephone and reviewed her history and confirmed current treatment. I use two identifiers to confirm her identity.  Hayley Webb notes that overall she is doing well and has had an uncomplicated pregnancy. She notes that she went to the MAU for evaluation of her contractions where she was found to have elevated blood pressure >160/100's. She notes that she has had headaches the resolve with Fioricet and or headache cocktail. She notes that she currently does not have a headache and her prior headache was 24 hour ago.  In addition, to Chronic hypertension which is being treated with 200 mg of labetalol prior to admission but not 400 mg, she also has SLE and is taking plaquenil and she is on therapeutic dose of Lovenox for history of a pulmonary embolism.   Currently, Hayley Webb blood pressure over the course of the day is within normal range, upon admission IV labetalol was administered to lower her blood pressure below the severe range threshold.   Her prior pregnancy was uncomplicated with exception of term delivery with fetal heart block. The CHB resolved spontaneously during the postnatal period.  Vitals:   01/12/19 1746 01/12/19 2204  BP: 123/71 124/69  Pulse: 98 99  Resp:  18  Temp:  97.8 F (36.6 C)  SpO2:  100%        Past Medical History:  Diagnosis Date  . Anxiety    doing good now  . Asthma   . Headache(784.0)   . HSV infection   . Hypertension   . Infection    UTI  . Lupus ([place])    dx age 43  . Pulmonary embolism ([place]) 06/4/82011  . STD (sexually transmitted disease) 02/2009   POSITIVE GC   Past Surgical History:  Procedure Laterality Date   . ADENOIDECTOMY    . TONSILLECTOMY AND ADENOIDECTOMY  1998  . WISDOM TOOTH EXTRACTION     Family History  Problem Relation Age of Onset  . Diabetes Maternal Aunt   . Cancer Maternal Grandmother 72       COLON CA  . Diabetes Maternal Grandmother   . Hypertension Maternal Grandfather   . Cancer Maternal Grandfather        prostate  . Asthma Mother   . Asthma Brother   . Cancer Paternal Grandmother        breast  . Lupus Paternal Grandmother   . Lupus Paternal Aunt   . Stroke Paternal Aunt   . Anesthesia problems Neg Hx   . Hypotension Neg Hx   . Malignant hyperthermia Neg Hx   . Pseudochol deficiency Neg Hx    Social History   Socioeconomic History  . Marital status: Single    Spouse name: Not on file  . Number of children: Not on file  . Years of education: Not on file  . Highest education level: Not on file  Occupational History  . Not on file  Social Needs  . Financial resource strain: Not hard at all  . Food insecurity    Worry: Never true    Inability: Never true  . Transportation needs    Medical: No    Non-medical: No  Tobacco Use  . Smoking status: Never Smoker  . Smokeless tobacco: Never Used  Substance and Sexual Activity  . Alcohol use: No  . Drug use: No  . Sexual activity: Yes    Partners: Male    Birth control/protection: None    Comment: not in months  Lifestyle  . Physical activity    Days per week: 0 days    Minutes per session: Not on file  . Stress: Not at all  Relationships  . Social connections    Talks on phone: More than three times a week    Gets together: Twice a week    Attends religious service: 1 to 4 times per year    Active member of club or organization: No    Attends meetings of clubs or organizations: Never    Relationship status: Living with partner  . Intimate partner violence    Fear of current or ex partner: No    Emotionally abused: No    Physically abused: No    Forced sexual activity: No  Other Topics  Concern  . Not on file  Social History Narrative  . Not on file   No current facility-administered medications on file prior to encounter.    Current Outpatient Medications on File Prior to Encounter  Medication Sig Dispense Refill  . albuterol (PROVENTIL HFA;VENTOLIN HFA) 108 (90 Base) MCG/ACT inhaler Inhale 2 puffs into the lungs every 6 (six) hours as needed for wheezing or shortness of breath.    Marland Kitchen albuterol (PROVENTIL) (2.5 MG/3ML) 0.083% nebulizer solution Take 2.5 mg by nebulization every 6 (six) hours as needed for wheezing or shortness of breath.    Marland Kitchen aspirin EC 81 MG tablet Take 1 tablet (81 mg total) by mouth daily. (Patient not taking: Reported on 09/04/2018) 90 tablet 3  . cholecalciferol (VITAMIN D) 1000 units tablet Take 1,000 Units by mouth daily.    . Doxylamine-Pyridoxine ER (BONJESTA) 20-20 MG TBCR [place] 20 mg-20 mg tablet,immediate and delay release  Take 1 tablet twice a day by oral route.    . enoxaparin (LOVENOX) 40 MG/0.4ML injection Inject 1.5 mLs (150 mg total) into the skin every 12 (twelve) hours.    . hydroxychloroquine (PLAQUENIL) 200 MG tablet Take 100 mg by mouth 2 (two) times daily.    Marland Kitchen labetalol (NORMODYNE) 200 MG tablet Take 2 tablets (400 mg total) by mouth 2 (two) times daily. 60 tablet 5  . metoCLOPramide (REGLAN) 10 MG tablet Take 1 tablet (10 mg total) by mouth every 6 (six) hours. (Patient not taking: Reported on 10/02/2018) 30 tablet 0  . ondansetron (ZOFRAN) 4 MG tablet Take 8 mg by mouth 2 (two) times daily.    . Prenatal Vit-Fe Fumarate-FA (MULTIVITAMIN-PRENATAL) 27-0.8 MG TABS tablet Take 1 tablet by mouth daily at 12 noon.    . promethazine (PHENERGAN) 25 MG tablet Take 0.5-1 tablets (12.5-25 mg total) by mouth every 6 (six) hours as needed. 30 tablet 0  . terbutaline (BRETHINE) 2.5 MG tablet Take 1 tablet (2.5 mg total) by mouth every 6 (six) hours as needed (if more than 6 contractions in 1 hour). 20 tablet 1  . terconazole (TERAZOL 7) 0.4 %  vaginal cream terconazole 0.4 % vaginal cream  Insert 1 applicatorful every day by vaginal route for 7 days.    . valACYclovir (VALTREX) 500 MG tablet Take 1 tablet (500 mg total) by mouth 2 (two) times daily. 60 tablet 1      CBC Latest Ref Rng &  Units 01/12/2019 01/10/2019 12/23/2018  WBC 4.0 - 10.5 K/uL 6.4 5.5 7.3  Hemoglobin 12.0 - 15.0 g/dL 10.5(L) 11.0(L) 10.9(L)  Hematocrit 36.0 - 46.0 % 32.1(L) 33.4(L) 32.7(L)  Platelets 150 - 400 K/uL 287 320 326   CMP Latest Ref Rng & Units 01/12/2019 01/10/2019 12/23/2018  Glucose 70 - 99 mg/dL 107(H) 109(H) -  BUN 6 - 20 mg/dL 6 6 -  Creatinine 0.44 - 1.00 mg/dL 0.62 0.53 0.59  Sodium 135 - 145 mmol/L 136 133(L) -  Potassium 3.5 - 5.1 mmol/L 3.3(L) 3.4(L) -  Chloride 98 - 111 mmol/L 105 105 -  CO2 22 - 32 mmol/L 21(L) 20(L) -  Calcium 8.9 - 10.3 mg/dL 9.4 9.2 -  Total Protein 6.5 - 8.1 g/dL 6.4(L) 6.8 -  Total Bilirubin 0.3 - 1.2 mg/dL <0.1(L) 0.2(L) -  Alkaline Phos 38 - 126 U/L 135(H) 146(H) -  AST 15 - 41 U/L 14(L) 14(L) -  ALT 0 - 44 U/L 13 11 -   Urinary protein creatinine ratio wnl.  Fetal status: Reactive tracing Prior growth on 5/28 EFW 1909 g 49% Normal fetal echocardiogram S/P BMZ 6/2 complete   Impression/Counseling  Chronic hypertension exacerbation:  I discussed with Hayley Webb that given her normal labs and resolution of severe range blood pressure with oral medication after one dose of IV therapy I suspect that this is an exacerbation of her chronic hypertension.  However, I am concerned about her frequent headaches that she doesn't experience outside of pregnancy. Although, they resolved with medication it appears as they are worsening and becoming more frequent.  I currently agree with management of observation and the increased dose of labetalol, however, if her headache persist a strong consideration of delivery is recommended.  If not consider delivery at 37 weeks as previously planned   SLE: Continue  plaquenil  H/O of PE: Consider switching to therapeutic heparin given increased risk of delivery with in 7 days.  I spent 30 minutes with Hayley Webb with >50 in direct non-face to face time in review of records consultation and care coordination.  I will stop by in the morning to review with Hayley Webb and assess her vitals over night.  All questions answered  Vikki Ports, MD.

## 2019-01-12 NOTE — Progress Notes (Signed)
Pt c/o headache, dizziness and weakness of legs at 0854, [name] notified. Order to give Fioricet & check CBG. Orders completed. CBG 152, no intervention. Informed patient to call if any changes prior to my return. 1050, pt states headache is worse and she still feels bad. Pt complains of contractions that feel worse and about 3 min apart, and some accompanying left sided chest pain that seems to coincide with cxs. Toco adjusted, still not seeing contractions on toco at this time. Palpation utilized, no significant tightening noticed. Voicemail left for [place], [name] at this time. Marry Guan

## 2019-01-12 NOTE — MAU Provider Note (Signed)
History     CSN: [id number]  Arrival date and time: 01/11/19 2352   First Provider Initiated Contact with Patient 01/12/19 0052      Chief Complaint  Patient presents with  . Contractions   Hayley Webb is a 33 y.o. G3P1011 at 7836w0d who [facility].  She presents today for Contractions.  Nurse informs provider of elevated blood pressures.  Provider to bedside and patient reports HA, floaters, and dizziness.  She also endorses contractions and fetal movement.  She reports taking terbutaline at 1900 for contractions without relief of symptoms.  She denies LoF, VB, and discharge.  She endorses a h/o CHTN and takes Labetalol 200mg  BID with her last dose at 630pm.  She was last seen in her office last week and was told she would be induced early.       OB History    Gravida  3   Para  1   Term  1   Preterm      AB  1   Living  1     SAB  1   TAB      Ectopic      Multiple      Live Births  1           Past Medical History:  Diagnosis Date  . Anxiety    doing good now  . Asthma   . Headache(784.0)   . HSV infection   . Hypertension   . Infection    UTI  . Lupus (HCC)    dx age 33  . Pulmonary embolism (HCC) 06/4/82011  . 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 ADENOIDECTOMY    . TONSILLECTOMY AND ADENOIDECTOMY  1998  . WISDOM TOOTH EXTRACTION      Family History  Problem Relation Age of Onset  . Diabetes Maternal Aunt   . Cancer Maternal Grandmother 72       COLON CA  . Diabetes Maternal Grandmother   . Hypertension Maternal Grandfather   . Cancer Maternal Grandfather        prostate  . Asthma Mother   . Asthma Brother   . Cancer Paternal Grandmother        breast  . Lupus Paternal Grandmother   . Lupus Paternal Aunt   . Stroke Paternal Aunt   . Anesthesia problems Neg Hx   . Hypotension Neg Hx   . Malignant hyperthermia Neg Hx   . Pseudochol  deficiency Neg Hx     Social History   Tobacco Use  . Smoking status: Never Smoker  . Smokeless tobacco: Never Used  Substance Use Topics  . Alcohol use: No  . Drug use: No    Allergies:  Allergies  Allergen Reactions  . Bactrim Anaphylaxis and Hives  . Hydrocodone-Acetaminophen Anaphylaxis  . Nifedipine Swelling and Anaphylaxis    Swelling of tongue  . Pineapple Itching  . Prednisone Anaphylaxis and Hives    Has been on Dexamethasone without issue.  . Shellfish Allergy Itching  . Sulfamethoxazole-Trimethoprim Other (See Comments)    Complications due to lupus  . Nsaids Other (See Comments)    Has Lupus, reccommended to avoid NSAIDs  . Other Itching    Shrimp:throat itching "can eat other shellfish"  . Vicodin [Hydrocodone-Acetaminophen] Hives and Swelling    Can take Percocet and Tylenol without reaction.    Medications Prior to Admission  Medication  Sig Dispense Refill Last Dose  . albuterol (PROVENTIL HFA;VENTOLIN HFA) 108 (90 Base) MCG/ACT inhaler Inhale 2 puffs into the lungs every 6 (six) hours as needed for wheezing or shortness of breath.     Marland Kitchen albuterol (PROVENTIL) (2.5 MG/3ML) 0.083% nebulizer solution Take 2.5 mg by nebulization every 6 (six) hours as needed for wheezing or shortness of breath.     Marland Kitchen aspirin EC 81 MG tablet Take 1 tablet (81 mg total) by mouth daily. (Patient not taking: Reported on 09/04/2018) 90 tablet 3   . cholecalciferol (VITAMIN D) 1000 units tablet Take 1,000 Units by mouth daily.     . Doxylamine-Pyridoxine ER (BONJESTA) 20-20 MG TBCR Bonjesta 20 mg-20 mg tablet,immediate and delay release  Take 1 tablet twice a day by oral route.     . enoxaparin (LOVENOX) 40 MG/0.4ML injection Inject 1.5 mLs (150 mg total) into the skin every 12 (twelve) hours.     . hydroxychloroquine (PLAQUENIL) 200 MG tablet Take 100 mg by mouth 2 (two) times daily.     Marland Kitchen labetalol (NORMODYNE) 200 MG tablet Take 2 tablets (400 mg total) by mouth 2 (two) times daily.  60 tablet 5   . metoCLOPramide (REGLAN) 10 MG tablet Take 1 tablet (10 mg total) by mouth every 6 (six) hours. (Patient not taking: Reported on 10/02/2018) 30 tablet 0   . ondansetron (ZOFRAN) 4 MG tablet Take 8 mg by mouth 2 (two) times daily.     . Prenatal Vit-Fe Fumarate-FA (MULTIVITAMIN-PRENATAL) 27-0.8 MG TABS tablet Take 1 tablet by mouth daily at 12 noon.     . promethazine (PHENERGAN) 25 MG tablet Take 0.5-1 tablets (12.5-25 mg total) by mouth every 6 (six) hours as needed. 30 tablet 0   . terbutaline (BRETHINE) 2.5 MG tablet Take 1 tablet (2.5 mg total) by mouth every 6 (six) hours as needed (if more than 6 contractions in 1 hour). 20 tablet 1   . terconazole (TERAZOL 7) 0.4 % vaginal cream terconazole 0.4 % vaginal cream  Insert 1 applicatorful every day by vaginal route for 7 days.     . valACYclovir (VALTREX) 500 MG tablet Take 1 tablet (500 mg total) by mouth 2 (two) times daily. 60 tablet 1     Review of Systems  Constitutional: Negative for chills and fever.  Eyes: Positive for visual disturbance (Floaters).  Respiratory: Negative for cough and shortness of breath.   Gastrointestinal: Negative for constipation, diarrhea, nausea and vomiting.  Genitourinary: Negative for difficulty urinating, dysuria, vaginal bleeding and vaginal discharge.  Neurological: Positive for dizziness and headaches (4/10). Negative for light-headedness.   Physical Exam   Blood pressure (!) 156/90, pulse (!) 120, temperature 98.3 F (36.8 C), temperature source Oral, resp. rate 20, last menstrual period 04/18/2018, SpO2 98 %.  Physical Exam  Constitutional: She is oriented to person, place, and time. She appears well-developed and well-nourished.  HENT:  Head: Atraumatic.  Eyes: Conjunctivae are normal.  Neck: Normal range of motion.  Cardiovascular: Normal rate, regular rhythm and normal heart sounds.  Respiratory: Effort normal and breath sounds normal.  GI: Soft.  Musculoskeletal: Normal  range of motion.  Neurological: She is alert and oriented to person, place, and time.  Skin: Skin is warm and dry.  Psychiatric: She has a normal mood and affect. Her behavior is normal.    Fetal Assessment 145 bpm, Mod Var, -Decels, +Accels Toco: Irregular, palpates mild  MAU Course   Results for orders placed or performed during the hospital  encounter of 01/11/19 (from the past 24 hour(s))  Protein / creatinine ratio, urine     Status: Abnormal   Collection Time: 01/12/19  1:01 AM  Result Value Ref Range   Creatinine, Urine 163.89 mg/dL   Total Protein, Urine 27 mg/dL   Protein Creatinine Ratio 0.16 (H) 0.00 - 0.15 mg/mg[Cre]  CBC     Status: Abnormal   Collection Time: 01/12/19  1:13 AM  Result Value Ref Range   WBC 6.4 4.0 - 10.5 K/uL   RBC 3.79 (L) 3.87 - 5.11 MIL/uL   Hemoglobin 10.5 (L) 12.0 - 15.0 g/dL   HCT 16.132.1 (L) 09.636.0 - 04.546.0 %   MCV 84.7 80.0 - 100.0 fL   MCH 27.7 26.0 - 34.0 pg   MCHC 32.7 30.0 - 36.0 g/dL   RDW 40.913.4 81.111.5 - 91.415.5 %   Platelets 287 150 - 400 K/uL   nRBC 0.0 0.0 - 0.2 %   No results found.  MDM PE Labs:PC Ratio, CBC, CMP EFM  Assessment and Plan  33 year old G3P1011  SIUP at 36 weeks Cat I FT CHTN HA  Visual disturbances  -Exam findings discussed. -Offered and accepts pain medication.  -Informed that PreEclampsia labs would be collected d/t HA and visual disturbance complaints. -No questions or concerns. -Fiorcet 2 tablets ordered.   Follow Up (1:42 AM) Elevated BP  -Nurse reports patient with critical bp x 2 -PreEclampsia orders placed for labetalol treatment via IV. *Start IV *Labetalol 20-80 q 15min prn -PC Ratio WNL -Patient reports no improvement with HA. -Will give HA cocktail after initial labetalol dose. -NST remains reactive -Will continue to monitor.  Follow Up (3:50 AM) BP improved  -Patient reports improvement in HA. -Patient states she is taking one Labetalol tablet twice daily. -Informed that provider  would contact attending MD for discharge with increase in medication vs continued monitoring. -Dr. Vergie LivingPickens consulted and recommends overnight observation with medication titration as appropriate. -Dr. [name] Doppole contacted and agrees with plan stating she will contact Jade, [name] to place admission orders.  Follow Up (4:01 AM) -Nurse reports patient Labetalol dosing should be two tablets twice daily. -Med Rec reviewed and confirms this dosing regime. -In room to address with patient who confirms that she has only been taking one tablet twice daily because "that's what my bottle says." -Patient informed of proper dosing. -Patient apologetic and reassured that once dosing regime corrected, bp will improve. -No questions or concerns. -Patient informed that primary [name] en route to discuss POC further.  Cherre RobinsJessica L Elis Sauber MSN, [name] 01/12/2019, 12:53 AM

## 2019-01-12 NOTE — H&P (Addendum)
Hayley Webb is a 33 y.o. G3P1011 at 2959w0d who receives care at [facility].  She presents today @ the MAU for Contractions.  Nurse informs provider of elevated blood pressures.  Provider to bedside and patient reports HA, floaters, and dizziness.  She also endorses contractions and fetal movement.  She reports taking terbutaline at 1900 for contractions without relief of symptoms.  She denies LoF, VB, and discharge.  She endorses a h/o CHTN and takes Labetalol 200mg  BID with her last dose at 630pm.  She was last seen in her office last week and was told she would be induced early.  Pt has h/o lupus on plaquenil, HSV no lesion on valtrex, PE in past on lovenox 150mg , plans to be switched to heparin 1 week before delivery.  In mAU labs neg for preE, decadron and benadryl with Fioricet given for HA with relief. Denies sob, cp, or LOC decreases. EFW on 6/17 was 5.10, vertex, afi normal, BPP 8/8.  US for growth and BPP on 6/17:   OB History    Gravida  3   Para  1   Term  1   Preterm      AB  1   Living  1     SAB  1   TAB      Ectopic      Multiple      Live Births  1          Past Medical History:  Diagnosis Date  . Anxiety    doing good now  . Asthma   . Headache(784.0)   . HSV infection   . Hypertension   . Infection    UTI  . Lupus (HCC)    dx age 33  . Pulmonary embolism (HCC) 06/4/82011  . STD (sexually transmitted disease) 02/2009   POSITIVE GC   Past Surgical History:  Procedure Laterality Date  . ADENOIDECTOMY    . TONSILLECTOMY AND ADENOIDECTOMY  1998  . WISDOM TOOTH EXTRACTION     Family History: family history includes Asthma in her brother and mother; Cancer in her maternal grandfather and paternal grandmother; Cancer (age of onset: 372) in her maternal grandmother; Diabetes in her maternal aunt and maternal grandmother; Hypertension in her maternal grandfather; Lupus in her paternal aunt and paternal grandmother; Stroke in her paternal aunt. Social History:   reports that she has never smoked. She has never used smokeless tobacco. She reports that she does not drink alcohol or use drugs.     Maternal Diabetes: No Genetic Screening: Normal Maternal Ultrasounds/Referrals: Normal Fetal Ultrasounds or other Referrals:  Other: Patient with obesity, history of Lupus, and chronic hypertension. Growth q4 weeks from 24 weeks and antenatal testing from 32 weeks.  Maternal Substance Abuse:  No Significant Maternal Medications:  Meds include: Other: Flexiril, Lovenox, Hydrochloroquinolone, Labetalol 400 BID, ASA, valtrex Significant Maternal Lab Results:  None Other Comments:  HSV+, GBS neg  Review of Systems  Gastrointestinal: Positive for abdominal pain.  Neurological: Positive for headaches.  All other systems reviewed and are negative.  Maternal Medical History:  Reason for admission: Contractions.   Contractions: Onset was more than 2 days ago.   Frequency: irregular.   Perceived severity is strong.    Fetal activity: Perceived fetal activity is normal.   Last perceived fetal movement was within the past hour.    Prenatal complications: PIH.   Prenatal Complications - Diabetes: none.     Dilation: 2 Effacement (%): Thick Station: Ballotable Exam by::  Maryagnes Amos RN   Vitals:   01/12/19 0309 01/12/19 0316 01/12/19 0331 01/12/19 0349  BP: (!) 135/97 (!) 136/94 138/85 127/78  Pulse: 89 89 90 89  Resp:      Temp:      TempSrc:      SpO2:         Maternal Exam:  Uterine Assessment: Contraction strength is mild.  Contraction frequency is irregular.   Abdomen: Patient reports no abdominal tenderness. Fundal height is Size=dates.   Estimated fetal weight is 4lbs .   Fetal presentation: breech  Introitus: Normal vulva. Vulva is negative for lesion.  Normal vagina.  Vagina is negative for discharge.  Amniotic fluid character: not assessed.  Pelvis: adequate for delivery.   Cervix: Cervix evaluated by digital exam.      Fetal Exam Fetal Monitor Review: Mode: ultrasound.   Baseline rate: 140s.  Variability: moderate (6-25 bpm).   Pattern: no decelerations and accelerations present.    Fetal State Assessment: Category I - tracings are normal.      Physical Exam  Nursing note and vitals reviewed. Constitutional: She is oriented to person, place, and time. She appears well-developed and well-nourished.  HENT:  Head: Normocephalic and atraumatic.  Eyes: Pupils are equal, round, and reactive to light.  Cardiovascular: Normal rate, regular rhythm and normal heart sounds.  Respiratory: Effort normal and breath sounds normal.  GI: Soft. Bowel sounds are normal.  Genitourinary:    Vulva, vagina and uterus normal.     No vulval lesion noted.     No vaginal discharge.   Musculoskeletal: Normal range of motion.  Neurological: She is alert and oriented to person, place, and time.  Skin: Skin is warm and dry.  Psychiatric: She has a normal mood and affect. Her behavior is normal. Judgment and thought content normal.    NST: FHR baseline 125 bpm, Variability: moderate, Accelerations:present, Decelerations:  Absent= Cat 1/Reactive UC:   none, irritable uterus, palpated one cxt with mild intensity, pt tolerated well, no grimacing.   Prenatal labs: ABO, Rh: --/--/O POS, O POS Performed at [facility], [place] [address], [place], [place] [address]  [id number] 1804) Antibody: NEG (06/01 1804) Rubella:  Immune RPR:   NR HBsAg:   NR HIV: Non Reactive (11/14 2203)  GBS: Negative (06/01 0000) Collected, results pending  Assessment/Plan: 33 y.o. G3P1 at [redacted]w[redacted]d admitted to ante for observation for Possible preterm labor & HA with severe range BP.  CHTN: Bps in [place] was 170/100, given 20 IV labetalol, currently 127/78. Was on labetalol 200mg  BID, increased to 400mg  BID per Dr Landry Mellow. Neg PreE labs, PCR 0.18. Consult pending for MFM and recommendations.   Lupus: ON plaquenil and to continue. In remission.     Preterm Cxt: Monitor, fluids. Took terbutaline @ home. Pt stated now cxt tolerable and mild.   Migraine: Was given IV benadryl with decadron and Fioricet with fluid, improved.   HSV+: Continue on valtrex 500mg  BID, exam when indicated. -lesion now.   H/O reoccurring PE: On lovenox 150mg  BID, will consulted with Dr Landry Mellow about changing to heparin due to possible onset of delivery and now [redacted] weeks gestation.   FWB: Reactive NST, category I FHTs, BMZ competed, normal fetal echo. GBS-. EFW on 6/17 was 5.10, vertex, afi normal, BPP 8/8.  Anxiety: atarax 50mg  TID PRN   Consulted with Dr Landry Mellow about admission.   [facility] [name], NP-C 01/12/2019, 4:00 AM  Patient seen and examined. Reports intermittent contractions. No  contractions on toco.  She reports headache improving currently mild.   CHTN- BP better controlled since admission.Continue labetalol 400 mg bid.  Given severe range pressure and headache on admission consult MFM regarding timing of delivery and switching from Lovenox to Heparin.   H/O reoccurring PE- continue therapeutic lovenox for now consult mfm regarding timing of delivery and  switching to heparin   Lupus - continue plaquenil   Fetal Well being-Category 1 tracing .Marland Kitchen.  NST q shift. Continuous toco for now.

## 2019-01-12 NOTE — Progress Notes (Signed)
[name] to BS to evaluate patient at 1115. Orders received. Hydroxyzine 50mg  given for anxiety and to help pt rest-1229. Reassessment at 1330, pt denies chest discomfort and states she feels significantly better. Visitor is at the [facility]. Pt states she does feel some uterine irritability. Patient is laughing, ordering lunch.  Will continue to monitor. Hayley Webb

## 2019-01-13 ENCOUNTER — Other Ambulatory Visit: Payer: Self-pay

## 2019-01-13 DIAGNOSIS — O10913 Unspecified pre-existing hypertension complicating pregnancy, third trimester: Secondary | ICD-10-CM

## 2019-01-13 DIAGNOSIS — O479 False labor, unspecified: Secondary | ICD-10-CM

## 2019-01-13 DIAGNOSIS — Z3A36 36 weeks gestation of pregnancy: Secondary | ICD-10-CM

## 2019-01-13 MED ORDER — HEPARIN SODIUM (PORCINE) 5000 UNIT/ML IJ SOLN
7500.0000 [IU] | Freq: Three times a day (TID) | INTRAMUSCULAR | Status: DC
  Administered 2019-01-13 – 2019-01-14 (×3): 7500 [IU] via SUBCUTANEOUS
  Filled 2019-01-13 (×4): qty 1.5

## 2019-01-13 MED ORDER — HEPARIN SODIUM (PORCINE) 10000 UNIT/ML IJ SOLN
7500.0000 [IU] | Freq: Three times a day (TID) | INTRAMUSCULAR | Status: DC
  Filled 2019-01-13: qty 1

## 2019-01-13 MED ORDER — PROMETHAZINE HCL 25 MG PO TABS
25.0000 mg | ORAL_TABLET | Freq: Four times a day (QID) | ORAL | Status: DC | PRN
  Filled 2019-01-13: qty 1

## 2019-01-13 NOTE — Progress Notes (Signed)
ANTICOAGULATION CONSULT NOTE - Initial Consult  Pharmacy Consult for Heparin SQ - transition from Lovenox Indication: VTE prophylaxis  Allergies  Allergen Reactions  . Bactrim Anaphylaxis and Hives  . Hydrocodone-Acetaminophen Anaphylaxis  . Nifedipine Swelling and Anaphylaxis    Swelling of tongue  . Pineapple Itching  . Prednisone Anaphylaxis and Hives    Has been on Dexamethasone without issue.  . Shellfish Allergy Itching  . Sulfamethoxazole-Trimethoprim Other (See Comments)    Complications due to lupus  . Nsaids Other (See Comments)    Has Lupus, reccommended to avoid NSAIDs  . Other Itching    Shrimp:throat itching "can eat other shellfish"  . Vicodin [Hydrocodone-Acetaminophen] Hives and Swelling    Can take Percocet and Tylenol without reaction.    Patient Measurements: Height: 5' 8.5" (174 cm) Weight: (!) 330 lb (149.7 kg) IBW/kg (Calculated) : 65.05  Vital Signs: Temp: 98 F (36.7 C) (06/22 0754) Temp Source: Oral (06/22 0754) BP: 131/79 (06/22 0754) Pulse Rate: 89 (06/22 0754)  Labs: Recent Labs    01/10/19 1400 01/12/19 0113  HGB 11.0* 10.5*  HCT 33.4* 32.1*  PLT 320 287  CREATININE 0.53 0.62    Estimated Creatinine Clearance: 160.5 mL/min (by C-G formula based on SCr of 0.62 mg/dL).   Medical History: Past Medical History:  Diagnosis Date  . Anxiety    doing good now  . Asthma   . Headache(784.0)   . HSV infection   . Hypertension   . Infection    UTI  . Lupus ([place])    dx age 56  . Pulmonary embolism ([place]) 06/4/82011  . STD (sexually transmitted disease) 02/2009   POSITIVE GC  hx ot PE 12/30/2016 Initial lupus anticoagulant test was positive. However, repeat test was negative per heme onc note  Medications:  Lovenox 150 mg SQ Q12 hrs - last dose 6/21 @ 20:30  Assessment: 33 yo female hospitalized with contractions and preeclampsia with headache at [redacted]w[redacted]d.  Plan is to switch to heparin SQ due to plans for induction of labor soon.   Per heme onc note, plan was to switch to heparin 7500 units SQ Q8 hr. Confirmed this plan with Dr. Charlesetta Garibaldi.  Goal of Therapy:  Monitor platelets by anticoagulation protocol: Yes   Plan:  Heparin 7500 SQ Q8 hr If patient does not deliver within 1-2 days, consider checking a heparin level and adjusting dose if needed.  Beryle Lathe 01/13/2019,9:55 AM

## 2019-01-13 NOTE — Progress Notes (Signed)
Pt without complaints.  No leakage of fluid or VB.  Good FM. Pt has a headache that is not relieved with current meds.    BP 136/82 (BP Location: Right Arm)   Pulse 94   Temp (!) 97.5 F (36.4 C) (Oral)   Resp 20   Ht 5' 8.5" (1.74 m)   Wt (!) 149.7 kg   LMP 04/18/2018 (Exact Date)   SpO2 98%   BMI 49.45 kg/m   FHTS 130 and reactive.  no contractions  Toco none  Pt in NAD CV RRR Lungs CTAB abd  Gravid soft and NT GU no vb EXt no calf tenderness Results for orders placed or performed during the hospital encounter of 01/11/19 (from the past 72 hour(s))  Protein / creatinine ratio, urine     Status: Abnormal   Collection Time: 01/12/19  1:01 AM  Result Value Ref Range   Creatinine, Urine 163.89 mg/dL   Total Protein, Urine 27 mg/dL    Comment: NO NORMAL RANGE ESTABLISHED FOR THIS TEST   Protein Creatinine Ratio 0.16 (H) 0.00 - 0.15 mg/mg[Cre]    Comment: Performed at [facility], [address] [place], [place] [address]  CBC     Status: Abnormal   Collection Time: 01/12/19  1:13 AM  Result Value Ref Range   WBC 6.4 4.0 - 10.5 K/uL   RBC 3.79 (L) 3.87 - 5.11 MIL/uL   Hemoglobin 10.5 (L) 12.0 - 15.0 g/dL   HCT 60.432.1 (L) 54.036.0 - 98.146.0 %   MCV 84.7 80.0 - 100.0 fL   MCH 27.7 26.0 - 34.0 pg   MCHC 32.7 30.0 - 36.0 g/dL   RDW 19.113.4 47.811.5 - 29.515.5 %   Platelets 287 150 - 400 K/uL   nRBC 0.0 0.0 - 0.2 %    Comment: Performed at [facility], [address] [place], [place] [address]  Comprehensive metabolic panel     Status: Abnormal   Collection Time: 01/12/19  1:13 AM  Result Value Ref Range   Sodium 136 135 - 145 mmol/L   Potassium 3.3 (L) 3.5 - 5.1 mmol/L   Chloride 105 98 - 111 mmol/L   CO2 21 (L) 22 - 32 mmol/L   Glucose, Bld 107 (H) 70 - 99 mg/dL   BUN 6 6 - 20 mg/dL   Creatinine, Ser 8.650.62 0.44 - 1.00 mg/dL   Calcium 9.4 8.9 - 78.410.3 mg/dL   Total Protein 6.4 (L) 6.5 - 8.1 g/dL   Albumin 2.5 (L) 3.5 - 5.0 g/dL   AST 14 (L) 15 - 41 U/L   ALT 13 0 - 44  U/L   Alkaline Phosphatase 135 (H) 38 - 126 U/L   Total Bilirubin <0.1 (L) 0.3 - 1.2 mg/dL   GFR calc non Af Amer >60 >60 mL/min   GFR calc Af Amer >60 >60 mL/min   Anion gap 10 5 - 15    Comment: Performed at [facility], [address] [place], [place] [address]  RPR     Status: None   Collection Time: 01/12/19  2:25 AM  Result Value Ref Range   RPR Ser Ql Non Reactive Non Reactive    Comment: (NOTE) Performed At: [facility] [address] [place], [place] [id number] Jolene SchimkeNagendra Sanjai MD WN:[id number]:[phone]   Type and screen [facility]     Status: None   Collection Time: 01/12/19  2:25 AM  Result Value Ref Range   ABO/RH(D)  O POS    Antibody Screen NEG    Sample Expiration      01/15/2019,2359 Performed at [facility], [address] [place], [place] [address]   SARS Coronavirus 2 (CEPHEID - Performed in [facility] hospital lab), Hosp Order     Status: None   Collection Time: 01/12/19  4:20 AM   Specimen: Nasopharyngeal Swab  Result Value Ref Range   SARS Coronavirus 2 NEGATIVE NEGATIVE    Comment: (NOTE) If result is NEGATIVE SARS-CoV-2 target nucleic acids are NOT DETECTED. The SARS-CoV-2 RNA is generally detectable in upper and lower  respiratory specimens during the acute phase of infection. The lowest  concentration of SARS-CoV-2 viral copies this assay can detect is 250  copies / mL. A negative result does not preclude SARS-CoV-2 infection  and should not be used as the sole basis for treatment or other  patient management decisions.  A negative result may occur with  improper specimen collection / handling, submission of specimen other  than nasopharyngeal swab, presence of viral mutation(s) within the  areas targeted by this assay, and inadequate number of viral copies  (<250 copies / mL). A negative result must be combined with clinical  observations, patient history, and epidemiological information. If result is  POSITIVE SARS-CoV-2 target nucleic acids are DETECTED. The SARS-CoV-2 RNA is generally detectable in upper and lower  respiratory specimens dur ing the acute phase of infection.  Positive  results are indicative of active infection with SARS-CoV-2.  Clinical  correlation with patient history and other diagnostic information is  necessary to determine patient infection status.  Positive results do  not rule out bacterial infection or co-infection with other viruses. If result is PRESUMPTIVE POSTIVE SARS-CoV-2 nucleic acids MAY BE PRESENT.   A presumptive positive result was obtained on the submitted specimen  and confirmed on repeat testing.  While 2019 novel coronavirus  (SARS-CoV-2) nucleic acids may be present in the submitted sample  additional confirmatory testing may be necessary for epidemiological  and / or clinical management purposes  to differentiate between  SARS-CoV-2 and other Sarbecovirus currently known to infect humans.  If clinically indicated additional testing with an alternate test  methodology [phone]([id number]) is advised. The SARS-CoV-2 RNA is generally  detectable in upper and lower respiratory sp ecimens during the acute  phase of infection. The expected result is Negative. Fact Sheet for Patients:  BoilerBrush.com.cyhttps://www.fda.gov/media/136312/download Fact Sheet for Healthcare Providers: https://pope.com/https://www.fda.gov/media/136313/download This test is not yet approved or cleared by the [place] FDA and has been authorized for detection and/or diagnosis of SARS-CoV-2 by FDA under an Emergency Use Authorization (EUA).  This EUA will remain in effect (meaning this test can be used) for the duration of the COVID-19 declaration under Section 564(b)(1) of the Act, 21 U.S.C. section 360bbb-3(b)(1), unless the authorization is terminated or revoked sooner. Performed at [facility], [address] [place], [place] [address]   Glucose, capillary     Status: Abnormal   Collection  Time: 01/12/19  8:57 AM  Result Value Ref Range   Glucose-Capillary 152 (H) 70 - 99 mg/dL    Assessment and Plan 3441w1d  Preeclampsia with severe features Will give oxy for headaches Fetal status is reassuring Changed lovenox to heparin TID.   Plan to do induction tomorrow.  Giving pt time to change over to heparin.  GBS neg.  Start magnesium in labor Discussed with the pt and agree with MFM Would hold heparin once induction is started.  Cbc in the morning;

## 2019-01-13 NOTE — Consult Note (Signed)
Follow up MFM Consult  I met with [name] G3P1 at 106 w 1d  this morning to review her status overnight. Her blood pressure is stable on 400 mg BID labetalol. She notes that she felt a gush of mucus this morning and has since had irregular non-painful contractions.  Of note she complains of a persistent headache. I asked for clarity on her headache history. She conveyed that she does not experience headaches very often and does not have a headache outside of pregnancy. She notes this headache is frontal on her left side and does not resolve with Tyelenol like other mild headaches she experiences. She denies epigastric pain or nausea/vomiting.   BP 131/79 (BP Location: Right Arm)   Pulse 89   Temp 98 F (36.7 C) (Oral)   Resp 18   Ht 5' 8.5" (1.74 m)   Wt (!) 149.7 kg   LMP 04/18/2018 (Exact Date)   SpO2 97%   BMI 49.45 kg/m   DTRs +1   FHR- reactive Cat 1  Impression/Plan:  Exacerbation of CHTN now with persistent headache.  [name] again has normal labs, but has persistent headache suggestive of preeclampsia with severe features.  Given her presentation I recommend movement toward delivery.  Recommend: Magnesium for seizure prophylaxis Switch to heparin from Lovenox Move toward IOL at earliest and safest moment  I spent 30 minutes with >50% in face to face communication.  I discussed with Dr. Charlesetta Garibaldi and she is in agreement.  All questions answered.  Vikki Ports, MD

## 2019-01-14 ENCOUNTER — Inpatient Hospital Stay (HOSPITAL_COMMUNITY): Payer: 59 | Admitting: Anesthesiology

## 2019-01-14 ENCOUNTER — Inpatient Hospital Stay (HOSPITAL_COMMUNITY): Payer: 59

## 2019-01-14 DIAGNOSIS — Z3A36 36 weeks gestation of pregnancy: Secondary | ICD-10-CM

## 2019-01-14 DIAGNOSIS — Z3689 Encounter for other specified antenatal screening: Secondary | ICD-10-CM

## 2019-01-14 LAB — CBC
HCT: 28.4 % — ABNORMAL LOW (ref 36.0–46.0)
HCT: 30.9 % — ABNORMAL LOW (ref 36.0–46.0)
Hemoglobin: 9.2 g/dL — ABNORMAL LOW (ref 12.0–15.0)
Hemoglobin: 10.1 g/dL — ABNORMAL LOW (ref 12.0–15.0)
MCH: 27.5 pg (ref 26.0–34.0)
MCH: 27.5 pg (ref 26.0–34.0)
MCHC: 32.4 g/dL (ref 30.0–36.0)
MCHC: 32.7 g/dL (ref 30.0–36.0)
MCV: 84.8 fL (ref 80.0–100.0)
MCV: 84.2 fL (ref 80.0–100.0)
Platelets: 275 10*3/uL (ref 150–400)
Platelets: 299 10*3/uL (ref 150–400)
RBC: 3.35 MIL/uL — ABNORMAL LOW (ref 3.87–5.11)
RBC: 3.67 MIL/uL — ABNORMAL LOW (ref 3.87–5.11)
RDW: 13.7 % (ref 11.5–15.5)
RDW: 13.8 % (ref 11.5–15.5)
WBC: 5.8 10*3/uL (ref 4.0–10.5)
WBC: 8 10*3/uL (ref 4.0–10.5)
nRBC: 0 % (ref 0.0–0.2)
nRBC: 0 % (ref 0.0–0.2)

## 2019-01-14 MED ORDER — EPHEDRINE 5 MG/ML INJ: 10.0000 mg | INTRAVENOUS | Status: DC | PRN

## 2019-01-14 MED ORDER — LABETALOL HCL 5 MG/ML IV SOLN: 40.0000 mg | INTRAVENOUS | Status: DC | PRN

## 2019-01-14 MED ORDER — SOD CITRATE-CITRIC ACID 500-334 MG/5ML PO SOLN
30.0000 mL | ORAL | Status: DC | PRN
  Administered 2019-01-14: 11:00:00 30 mL via ORAL
  Filled 2019-01-14: qty 30

## 2019-01-14 MED ORDER — FENTANYL-BUPIVACAINE-NACL 0.5-0.125-0.9 MG/250ML-% EP SOLN
12.0000 mL/h | EPIDURAL | Status: DC | PRN
  Filled 2019-01-14: qty 250

## 2019-01-14 MED ORDER — LACTATED RINGERS IV SOLN: 500.0000 mL | INTRAVENOUS | Status: DC | PRN

## 2019-01-14 MED ORDER — LIDOCAINE HCL (PF) 1 % IJ SOLN
INTRAMUSCULAR | Status: DC | PRN
  Administered 2019-01-14 (×2): 5 mL via EPIDURAL

## 2019-01-14 MED ORDER — DIPHENHYDRAMINE HCL 50 MG/ML IJ SOLN: 12.5000 mg | INTRAMUSCULAR | Status: DC | PRN

## 2019-01-14 MED ORDER — LABETALOL HCL 5 MG/ML IV SOLN: 80.0000 mg | INTRAVENOUS | Status: DC | PRN

## 2019-01-14 MED ORDER — PHENYLEPHRINE 40 MCG/ML (10ML) SYRINGE FOR IV PUSH (FOR BLOOD PRESSURE SUPPORT)
80.0000 ug | PREFILLED_SYRINGE | INTRAVENOUS | Status: DC | PRN
  Filled 2019-01-14: qty 10

## 2019-01-14 MED ORDER — SODIUM CHLORIDE (PF) 0.9 % IJ SOLN
INTRAMUSCULAR | Status: DC | PRN
  Administered 2019-01-14: 12 mL/h via EPIDURAL

## 2019-01-14 MED ORDER — PHENYLEPHRINE 40 MCG/ML (10ML) SYRINGE FOR IV PUSH (FOR BLOOD PRESSURE SUPPORT)
80.0000 ug | PREFILLED_SYRINGE | INTRAVENOUS | Status: DC | PRN
  Administered 2019-01-14: 80 ug via INTRAVENOUS

## 2019-01-14 MED ORDER — OXYTOCIN 40 UNITS IN NORMAL SALINE INFUSION - SIMPLE MED
2.5000 [IU]/h | INTRAVENOUS | Status: DC
  Administered 2019-01-15: 2.5 [IU]/h via INTRAVENOUS
  Filled 2019-01-14: qty 1000

## 2019-01-14 MED ORDER — LACTATED RINGERS IV SOLN
INTRAVENOUS | Status: DC
  Administered 2019-01-14 (×2): via INTRAVENOUS

## 2019-01-14 MED ORDER — OXYTOCIN BOLUS FROM INFUSION
500.0000 mL | Freq: Once | INTRAVENOUS | Status: AC
  Administered 2019-01-15: 05:00:00 500 mL via INTRAVENOUS

## 2019-01-14 MED ORDER — HYDRALAZINE HCL 20 MG/ML IJ SOLN: 10.0000 mg | INTRAMUSCULAR | Status: DC | PRN

## 2019-01-14 MED ORDER — MAGNESIUM SULFATE 40 G IN LACTATED RINGERS - SIMPLE
1.0000 g/h | INTRAVENOUS | Status: AC
  Administered 2019-01-14: 2 g/h via INTRAVENOUS
  Administered 2019-01-15: 1 g/h via INTRAVENOUS
  Filled 2019-01-14: qty 500

## 2019-01-14 MED ORDER — FENTANYL CITRATE (PF) 100 MCG/2ML IJ SOLN
100.0000 ug | INTRAMUSCULAR | Status: DC | PRN
  Administered 2019-01-14 (×3): 100 ug via INTRAVENOUS
  Filled 2019-01-14 (×3): qty 2

## 2019-01-14 MED ORDER — LIDOCAINE HCL (PF) 1 % IJ SOLN: 30.0000 mL | INTRAMUSCULAR | Status: DC | PRN

## 2019-01-14 MED ORDER — MISOPROSTOL 25 MCG QUARTER TABLET
25.0000 ug | ORAL_TABLET | ORAL | Status: DC | PRN
  Administered 2019-01-14: 12:00:00 25 ug via VAGINAL
  Filled 2019-01-14: qty 1

## 2019-01-14 MED ORDER — ONDANSETRON HCL 4 MG/2ML IJ SOLN: 4.0000 mg | Freq: Four times a day (QID) | INTRAMUSCULAR | Status: DC | PRN

## 2019-01-14 MED ORDER — TERBUTALINE SULFATE 1 MG/ML IJ SOLN: 0.2500 mg | Freq: Once | INTRAMUSCULAR | Status: DC | PRN

## 2019-01-14 MED ORDER — OXYTOCIN 40 UNITS IN NORMAL SALINE INFUSION - SIMPLE MED
1.0000 m[IU]/min | INTRAVENOUS | Status: DC
  Administered 2019-01-14: 2 m[IU]/min via INTRAVENOUS

## 2019-01-14 MED ORDER — MAGNESIUM SULFATE BOLUS VIA INFUSION
4.0000 g | Freq: Once | INTRAVENOUS | Status: AC
  Administered 2019-01-14: 4 g via INTRAVENOUS
  Filled 2019-01-14: qty 500

## 2019-01-14 MED ORDER — ACETAMINOPHEN 325 MG PO TABS: 650.0000 mg | ORAL_TABLET | ORAL | Status: DC | PRN

## 2019-01-14 MED ORDER — LACTATED RINGERS IV SOLN
500.0000 mL | Freq: Once | INTRAVENOUS | Status: AC
  Administered 2019-01-14: 500 mL via INTRAVENOUS

## 2019-01-14 MED ORDER — LABETALOL HCL 5 MG/ML IV SOLN: 20.0000 mg | INTRAVENOUS | Status: DC | PRN

## 2019-01-14 NOTE — Plan of Care (Signed)
  Problem: Education: °Goal: Knowledge of disease or condition will improve °Outcome: Completed/Met °Goal: Knowledge of the prescribed therapeutic regimen will improve °Outcome: Completed/Met °Goal: Individualized Educational Video(s) °Outcome: Completed/Met °  °Problem: Clinical Measurements: °Goal: Complications related to the disease process, condition or treatment will be avoided or minimized °Outcome: Completed/Met °  °

## 2019-01-14 NOTE — Anesthesia Procedure Notes (Signed)
Epidural Patient location during procedure: OB Start time: 01/14/2019 8:26 PM End time: 01/14/2019 8:39 PM  Staffing Anesthesiologist: Duane Boston, MD Performed: anesthesiologist   Preanesthetic Checklist Completed: patient identified, site marked, pre-op evaluation, timeout performed, IV checked, risks and benefits discussed and monitors and equipment checked  Epidural Patient position: sitting Prep: DuraPrep Patient monitoring: heart rate, cardiac monitor, continuous pulse ox and blood pressure Approach: midline Location: L2-L3 Injection technique: LOR saline  Needle:  Needle type: Tuohy  Needle gauge: 17 G Needle length: 9 cm Needle insertion depth: 8 cm Catheter size: 20 Guage Catheter at skin depth: 13 cm Test dose: negative and Other  Assessment Events: blood not aspirated, injection not painful, no injection resistance and negative IV test  Additional Notes Informed consent obtained prior to proceeding including risk of failure, 1% risk of PDPH, risk of minor discomfort and bruising.  Discussed rare but serious complications including epidural abscess, permanent nerve injury, epidural hematoma.  Discussed alternatives to epidural analgesia and patient desires to proceed.  Timeout performed pre-procedure verifying patient name, procedure, and platelet count.  Patient tolerated procedure well.

## 2019-01-14 NOTE — Anesthesia Preprocedure Evaluation (Signed)
Anesthesia Evaluation  Patient identified by MRN, date of birth, ID band Patient awake    Reviewed: Allergy & Precautions, H&P , NPO status , Patient's Chart, lab work & pertinent test results  History of Anesthesia Complications Negative for: history of anesthetic complications  Airway Mallampati: II  TM Distance: >3 FB Neck ROM: full    Dental no notable dental hx. (+) Dental Advisory Given   Pulmonary    Pulmonary exam normal        Cardiovascular hypertension, negative cardio ROS Normal cardiovascular exam     Neuro/Psych    GI/Hepatic negative GI ROS, Neg liver ROS,   Endo/Other  Morbid obesity  Renal/GU negative Renal ROS     Musculoskeletal   Abdominal (+) + obese,   Peds  Hematology negative hematology ROS (+)   Anesthesia Other Findings   Reproductive/Obstetrics (+) Pregnancy                             Anesthesia Physical  Anesthesia Plan  ASA: III  Anesthesia Plan: Epidural   Post-op Pain Management:    Induction:   PONV Risk Score and Plan:   Airway Management Planned: Natural Airway  Additional Equipment:   Intra-op Plan:   Post-operative Plan:   Informed Consent: I have reviewed the patients History and Physical, chart, labs and discussed the procedure including the risks, benefits and alternatives for the proposed anesthesia with the patient or authorized representative who has indicated his/her understanding and acceptance.       Plan Discussed with: Anesthesiologist  Anesthesia Plan Comments:         Anesthesia Quick Evaluation

## 2019-01-14 NOTE — Progress Notes (Signed)
Hayley Webb is a 33 y.o. G3P1011 at [redacted]w[redacted]d by LMP admitted for induction of labor due to Pre-eclamptic toxemia of pregnancy..  Subjective: Patient comfortable with epidural  Objective: BP 140/79   Pulse 80   Temp 98.3 F (36.8 C) (Oral)   Resp 14   Ht 5' 8.5" (1.74 m)   Wt (!) 149.7 kg   LMP 04/18/2018 (Exact Date)   SpO2 97%   BMI 49.45 kg/m  I/O last 3 completed shifts: In: 1832.4 [P.O.:650; I.V.:1182.4] Out: 625 [Urine:625] Total I/O In: 1039.6 [P.O.:320; I.V.:719.6] Out: 650 [Urine:150; Emesis/NG output:500]  FHT:  FHR: 135 bpm, variability: moderate,  accelerations:  Present,  decelerations:  Absent UC:   regular, every 3 minutes SVE:   Dilation: 4 Effacement (%): 70 Station: -3 Exam by:: dr Vera Wishart AROM performed, large amount of clear fluid  Labs: Lab Results  Component Value Date   WBC 8.0 01/14/2019   HGB 10.1 (L) 01/14/2019   HCT 30.9 (L) 01/14/2019   MCV 84.2 01/14/2019   PLT 299 01/14/2019    Assessment / Plan: Induction of labor due to preeclampsia,  Still in early / active labor  Labor: Continue pitocin augmentation Preeclampsia:  no signs or symptoms of toxicity, intake and ouput balanced and labs stable Fetal Wellbeing:  Category I Pain Control:  Epidural I/D:  n/a Anticipated MOD:  NSVD  Hayley Webb Hayley Webb 01/14/2019, 10:09 PM

## 2019-01-14 NOTE — Progress Notes (Signed)
   01/14/19 1800 01/14/19 1808  Fetal Heart Rate A  Mode External  --   Baseline Rate (A) 125 bpm  --   Variability <5 BPM;6-25 BPM  --   Accelerations 10 x 10;15 x 15  --   Decelerations None  --   Uterine Activity  Mode Toco  --   Contraction Frequency (min) 2-4  --   Contraction Duration (sec) 40-50  --   Contraction Quality Mild  --   Resting Tone Palpated Relaxed  --   Resting Time Adequate  --   Cervical Exam  Dilation  --  4  Effacement (%)  --  70  Cervical Position  --  Middle;Posterior  Vag. Bleeding  --  None  Station  --  Ballotable  Presentation  --  Vertex  Exam by:  --  Elza Rafter rnc  Report called to Dr Cletis Media re: above assessment, pt pain management & urine output.  See new orders.

## 2019-01-14 NOTE — Progress Notes (Signed)
Dr Cletis Media called & report given re: SVE & unable to verify presentation digitally.  Orders rec'd for bedside u/s per radiology.  Report given re: pt status of mag bolus, reactive FHR & POC.  Will place cytotec after vertex presentation verified.

## 2019-01-14 NOTE — Progress Notes (Signed)
   01/14/19 1603  Fetal Heart Rate A  Mode External  Baseline Rate (A) 125 bpm  Variability 6-25 BPM  Accelerations 10 x 10  Decelerations None  Uterine Activity  Mode Toco  Contraction Frequency (min) 1.5-2  Contraction Duration (sec) 40-50  Contraction Quality Mild  Resting Tone Palpated Relaxed  Resting Time Adequate  Cervical Exam  Dilation 3  Effacement (%) 70  Cervical Position Middle  Cervical Consistency Soft  Vag. Bleeding None  Station Ballotable  Presentation Vertex  Exam by: Elza Rafter rnc  Dr Rivard called & report given re: above assessment & that pt c/o contraction pain.  Orders rec'd to hold cytotec/pitocin & reevaluate later.

## 2019-01-14 NOTE — Progress Notes (Signed)
Hayley Webb is a 33 y.o. G3P1011 at [redacted]w[redacted]d by LMP admitted for induction of labor due to Pre-eclamptic toxemia of pregnancy..  Subjective: Patient starting to feel more uncomfortable  Objective: BP 129/78   Pulse 78   Temp 98.3 F (36.8 C) (Oral)   Resp 18   Ht 5' 8.5" (1.74 m)   Wt (!) 149.7 kg   LMP 04/18/2018 (Exact Date)   SpO2 97%   BMI 49.45 kg/m  I/O last 3 completed shifts: In: 1832.4 [P.O.:650; I.V.:1182.4] Out: 625 [Urine:625] No intake/output data recorded.  FHT:  FHR: 120 bpm, variability: minimal ,  accelerations:  Abscent,  decelerations:  Absent UC:   regular, every 2-3 minutes SVE:   Dilation: 4 Effacement (%): 70 Station: Ballotable Exam by:: jaton burgess rnc  Labs: Lab Results  Component Value Date   WBC 8.0 01/14/2019   HGB 10.1 (L) 01/14/2019   HCT 30.9 (L) 01/14/2019   MCV 84.2 01/14/2019   PLT 299 01/14/2019    Assessment / Plan: Induction of labor due to preeclampsia,  progressing well on pitocin  Labor: Progressing on Pitocin, will continue to increase then AROM Preeclampsia:  no signs or symptoms of toxicity, intake and ouput balanced and labs stable Fetal Wellbeing:  Category II Pain Control:  Epidural--patient requesting now I/D:  n/a Anticipated MOD:  NSVD  Jeray Shugart, [place] 01/14/2019, 8:01 PM

## 2019-01-14 NOTE — Progress Notes (Addendum)
Purvis Sheffieldndrea Foster is a 33 y.o. female patient, G3P1 at 67107w2d. Patient was admitted for contractions and severe range blood pressures.   1. Threatened labor, antepartum   2. Chronic hypertension in obstetric context in third trimester    Past Medical History:  Diagnosis Date  . Anxiety    doing good now  . Asthma   . Headache(784.0)   . HSV infection   . Hypertension   . Infection    UTI  . Lupus (HCC)    dx age 33  . Pulmonary embolism (HCC) 06/4/82011  . STD (sexually transmitted disease) 02/2009   POSITIVE GC   Current Facility-Administered Medications  Medication Dose Route Frequency Provider Last Rate Last Dose  . albuterol (PROVENTIL) (2.5 MG/3ML) 0.083% nebulizer solution 3 mL  3 mL Inhalation Q6H PRN Dale DurhamMontana, Jade, FNP      . aspirin EC tablet 81 mg  81 mg Oral Daily Filer[place]ana, Lesly RubensteinJade, [place]NP   81 mg at 01/13/19 [id number]  . butalbital-acetaminophen-caffeine (FIORICET) 50-325-40 MG per tablet 2 tablet  2 tablet Oral Q6H PRN Gerrit HeckEmly, Jessica, [name]   2 tablet at 01/13/19 0607  . calcium carbonate (TUMS - dosed in mg elemental calcium) chewable tablet 400 mg of elemental calcium  2 tablet Oral Q4H PRN Dale DurhamMontana, Jade, FNP   400 mg of elemental calcium at 01/13/19 1124  . docusate sodium (COLACE) capsule 100 mg  100 mg Oral Daily Eagles Mere[place]ana, Lesly RubensteinJade, FNP   100 mg at 01/13/19 [id number]  . heparin injection 7,500 Units  7,500 Units Subcutaneous Q8H Janeece RiggersGreer, Ellis K, [name]   7,500 Units at 01/14/19 0328  . labetalol (NORMODYNE) injection 20 mg  20 mg Intravenous PRN Gerrit HeckEmly, Jessica, [name]   20 mg at 01/12/19 0225   And  . labetalol (NORMODYNE) injection 40 mg  40 mg Intravenous PRN Gerrit HeckEmly, Jessica, [name]       And  . labetalol (NORMODYNE) injection 80 mg  80 mg Intravenous PRN Gerrit HeckEmly, Jessica, [name]       And  . hydrALAZINE (APRESOLINE) injection 10 mg  10 mg Intravenous PRN Gerrit HeckEmly, Jessica, [name]      . hydroxychloroquine (PLAQUENIL) tablet 200 mg  200 mg Oral Daily Wheeler[place]ana, [place], FNP   200 mg at 01/13/19 0936  . hydrOXYzine  (ATARAX/VISTARIL) tablet 50 mg  50 mg Oral TID PRN Dale DurhamMontana, Jade, FNP   50 mg at 01/13/19 1730  . labetalol (NORMODYNE) tablet 400 mg  400 mg Oral BID Dale DurhamMontana, Jade, FNP   400 mg at 01/13/19 2137  . lactated ringers infusion   Intravenous Continuous [place], Jade, [place]NP 125 mL/hr at 01/12/19 1700    . metoCLOPramide (REGLAN) tablet 10 mg  10 mg Oral Q6H Montana, Lesly RubensteinJade, FNP   10 mg at 01/14/19 0559  . ondansetron (ZOFRAN) tablet 8 mg  8 mg Oral BID Dale DurhamMontana, Jade, FNP   8 mg at 01/13/19 2137  . prenatal multivitamin tablet 1 tablet  1 tablet Oral Q1200 Dale DurhamMontana, Jade, [place]NP   1 tablet at 01/13/19 1119  . promethazine (PHENERGAN) tablet 25 mg  25 mg Oral Q6H PRN Dillard, Naima, MD      . valACYclovir (VALTREX) tablet 500 mg  500 mg Oral BID Dale DurhamMontana, Jade, FNP   500 mg at 01/13/19 2137  . zolpidem (AMBIEN) tablet 5 mg  5 mg Oral QHS PRN Dale DurhamMontana, Jade, FNP   5 mg at 01/13/19 2326   Allergies  Allergen Reactions  . Bactrim Anaphylaxis and Hives  . Hydrocodone-Acetaminophen Anaphylaxis  .  Nifedipine Swelling and Anaphylaxis    Swelling of tongue  . Pineapple Itching  . Prednisone Anaphylaxis and Hives    Has been on Dexamethasone without issue.  . Shellfish Allergy Itching  . Sulfamethoxazole-Trimethoprim Other (See Comments)    Complications due to lupus  . Nsaids Other (See Comments)    Has Lupus, reccommended to avoid NSAIDs  . Other Itching    Shrimp:throat itching "can eat other shellfish"  . Vicodin [Hydrocodone-Acetaminophen] Hives and Swelling    Can take Percocet and Tylenol without reaction.   Active Problems:   Threatened preterm labor   Chronic hypertension complicating or reason for care during pregnancy, third trimester  Subjective  Patient reports feeling well. She reports her headache has greatly improved and is now 2/10. Per MFM and Dr. Charlesetta Garibaldi, plan today is to transfer patient to L&D for induction of labor for preeclampsia with severe features. L&D has induction slot  available between 0800 and 0900 this am. Per MFM, patient to begin MgSO4 at time of induction and per Dr. Cletis Media, patient to stop Heparin now.    Objective: Vital signs: (most recent): Blood pressure 114/62, pulse 88, temperature 98 F (36.7 C), temperature source Oral, resp. rate 20, height 5' 8.5" (1.74 m), weight (!) 149.7 kg, last menstrual period 04/18/2018, SpO2 96 %.   Lungs:  Normal effort.  She is not in respiratory distress.   Extremities: Normal range of motion.   Neurological: Patient is alert.   Pupils:  Pupils are equal, round, and reactive to light.   Skin:  Warm and dry.     Vitals:   01/13/19 2140 01/13/19 2220 01/13/19 2325 01/14/19 0327  BP: 133/75  121/67 114/62  Pulse: 90  84 88  Resp:   20 20  Temp:   98.5 F (36.9 C) 98 F (36.7 C)  TempSrc:   Oral Oral  SpO2:  97% 96% 96%  Weight:      Height:       Physical Exam  Nursing note and vitals reviewed. Constitutional: She is oriented to person, place, and time. She appears well-developed and well-nourished.  HENT:  Head: Normocephalic and atraumatic.  Eyes: Pupils are equal, round, and reactive to light.  Respiratory: Effort normal. No respiratory distress.  Musculoskeletal: Normal range of motion.  Neurological: She is alert and oriented to person, place, and time.  Skin: Skin is warm and dry.  Psychiatric: She has a normal mood and affect. Her behavior is normal. Judgment and thought content normal.   Assessment & Plan 33 y.o. G3P1 at [redacted]w[redacted]d  Chronic hypertension with superimposed preeclampsia with severe features per MFM Reactive NST, category I FHTs last night  Lupus on Plaquenil  History of PE on Heparin 7500u TID, per Dr. 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 Cletis Media:  Start MgSO4 in L&D  SCDs at all times  Discontinue Heparin now  Cytotec  induction of labor   Marikay Alar 01/14/2019

## 2019-01-14 NOTE — Progress Notes (Signed)
Hayley Webb is a 33 y.o. G3P1011 at 59w2dadmitted for [facility] with superimposed pre-eclampsia with severe features due to intractable headache. All lans are normal. PCR 0.16. BP well controlled with Labetalol 400 mg BID. Transferred to L&D at 11:00 for IOL. Currently reports a mild headache 2/10, much improved since yesterday  Pregnancy complicated by:  1. CHTN currently on Labetalol 400 mg BID 2. H/O pulmonary embolus: on full anticoagulation with Lovenox during pregnancy changed to Heparin TID yesterday. Last dose 3:28 am 3. Lupus on Plaquenil 4. 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 Morbid obesity : BMI 49  Subjective: Cytotec #1 received at 11:45 Comfortable with  Labor support without medications Contractions every 2-4 minutes, lasting 30-40 seconds, intensity 7/10    Objective: BP 133/78   Pulse 93   Temp 98.2 F (36.8 C) (Oral)   Resp 18   Ht 5' 8.5" (1.74 m)   Wt (!) 149.7 kg   LMP 04/18/2018 (Exact Date)   SpO2 97%   BMI 49.45 kg/m  I/O last 3 completed shifts: In: 480 [P.O.:480] Out: 1350 [Urine:1350] No intake/output data recorded.  FHT:  Category 1 SVE:   Dilation: 2 Effacement (%): Thick Station: Ballotable Exam by:: presentation confirmed by u/s  Labs: Lab Results  Component Value Date   WBC 5.8 01/14/2019   HGB 9.2 (L) 01/14/2019   HCT 28.4 (L) 01/14/2019   MCV 84.8 01/14/2019   PLT 275 01/14/2019    Assessment / Plan: Induction of labor due to preeclampsia with severe features Fetal Wellbeing: reassuring Anticipated MOD:  NSVD POC reviewed with patient and husband. Questions answered  Dede Query Alleya Demeter 01/14/2019, 12:52 PM

## 2019-01-15 ENCOUNTER — Encounter (HOSPITAL_COMMUNITY): Payer: Self-pay

## 2019-01-15 LAB — CBC
HCT: 31.4 % — ABNORMAL LOW (ref 36.0–46.0)
Hemoglobin: 10.3 g/dL — ABNORMAL LOW (ref 12.0–15.0)
MCH: 27.5 pg (ref 26.0–34.0)
MCHC: 32.8 g/dL (ref 30.0–36.0)
MCV: 84 fL (ref 80.0–100.0)
Platelets: 302 10*3/uL (ref 150–400)
RBC: 3.74 MIL/uL — ABNORMAL LOW (ref 3.87–5.11)
RDW: 13.8 % (ref 11.5–15.5)
WBC: 9.1 10*3/uL (ref 4.0–10.5)
nRBC: 0 % (ref 0.0–0.2)

## 2019-01-15 LAB — CREATININE, SERUM
Creatinine, Ser: 0.58 mg/dL (ref 0.44–1.00)
GFR calc Af Amer: >60 mL/min (ref >60)
GFR calc non Af Amer: >60 mL/min (ref >60)

## 2019-01-15 LAB — RPR: RPR Ser Ql: NONREACTIVE

## 2019-01-15 MED ORDER — OXYCODONE HCL 5 MG PO TABS
5.0000 mg | ORAL_TABLET | ORAL | Status: DC | PRN
  Administered 2019-01-15 – 2019-01-16 (×4): 5 mg via ORAL
  Filled 2019-01-15 (×4): qty 1

## 2019-01-15 MED ORDER — ZOLPIDEM TARTRATE 5 MG PO TABS: 5.0000 mg | ORAL_TABLET | Freq: Every evening | ORAL | Status: DC | PRN

## 2019-01-15 MED ORDER — ONDANSETRON HCL 4 MG/2ML IJ SOLN: 4.0000 mg | INTRAMUSCULAR | Status: DC | PRN

## 2019-01-15 MED ORDER — SIMETHICONE 80 MG PO CHEW: 80.0000 mg | CHEWABLE_TABLET | ORAL | Status: DC | PRN

## 2019-01-15 MED ORDER — COCONUT OIL OIL
1.0000 "application " | TOPICAL_OIL | Status: DC | PRN
  Administered 2019-01-15: 1 via TOPICAL

## 2019-01-15 MED ORDER — ENOXAPARIN SODIUM 40 MG/0.4ML ~~LOC~~ SOLN: 40.0000 mg | SUBCUTANEOUS | Status: DC

## 2019-01-15 MED ORDER — ENOXAPARIN SODIUM 150 MG/ML ~~LOC~~ SOLN
1.0000 mg/kg | Freq: Two times a day (BID) | SUBCUTANEOUS | Status: DC
  Administered 2019-01-15 – 2019-01-17 (×4): 150 mg via SUBCUTANEOUS
  Filled 2019-01-15 (×4): qty 1

## 2019-01-15 MED ORDER — PRENATAL MULTIVITAMIN CH
1.0000 | ORAL_TABLET | Freq: Every day | ORAL | Status: DC
  Administered 2019-01-15 – 2019-01-16 (×2): 1 via ORAL
  Filled 2019-01-15 (×2): qty 1

## 2019-01-15 MED ORDER — DIBUCAINE (PERIANAL) 1 % EX OINT: 1.0000 "application " | TOPICAL_OINTMENT | CUTANEOUS | Status: DC | PRN

## 2019-01-15 MED ORDER — BENZOCAINE-MENTHOL 20-0.5 % EX AERO
1.0000 "application " | INHALATION_SPRAY | CUTANEOUS | Status: DC | PRN
  Administered 2019-01-15: 1 via TOPICAL
  Filled 2019-01-15 (×2): qty 56

## 2019-01-15 MED ORDER — ALBUTEROL SULFATE (2.5 MG/3ML) 0.083% IN NEBU: 2.5000 mg | INHALATION_SOLUTION | Freq: Four times a day (QID) | RESPIRATORY_TRACT | Status: DC | PRN

## 2019-01-15 MED ORDER — SENNOSIDES-DOCUSATE SODIUM 8.6-50 MG PO TABS
2.0000 | ORAL_TABLET | ORAL | Status: DC
  Administered 2019-01-15 – 2019-01-16 (×2): 2 via ORAL
  Filled 2019-01-15 (×2): qty 2

## 2019-01-15 MED ORDER — OXYCODONE HCL 5 MG PO TABS
10.0000 mg | ORAL_TABLET | ORAL | Status: DC | PRN
  Administered 2019-01-16 – 2019-01-17 (×4): 10 mg via ORAL
  Filled 2019-01-15 (×4): qty 2

## 2019-01-15 MED ORDER — OXYTOCIN 40 UNITS IN NORMAL SALINE INFUSION - SIMPLE MED: 1.0000 m[IU]/min | INTRAVENOUS | Status: DC

## 2019-01-15 MED ORDER — WITCH HAZEL-GLYCERIN EX PADS: 1.0000 "application " | MEDICATED_PAD | CUTANEOUS | Status: DC | PRN

## 2019-01-15 MED ORDER — DIPHENHYDRAMINE HCL 25 MG PO CAPS: 25.0000 mg | ORAL_CAPSULE | Freq: Four times a day (QID) | ORAL | Status: DC | PRN

## 2019-01-15 MED ORDER — ONDANSETRON HCL 4 MG PO TABS: 4.0000 mg | ORAL_TABLET | ORAL | Status: DC | PRN

## 2019-01-15 MED ORDER — TETANUS-DIPHTH-ACELL PERTUSSIS 5-2.5-18.5 LF-MCG/0.5 IM SUSP: 0.5000 mL | Freq: Once | INTRAMUSCULAR | Status: DC

## 2019-01-15 MED ORDER — LACTATED RINGERS IV SOLN
INTRAVENOUS | Status: DC
  Administered 2019-01-15: 17:00:00 via INTRAVENOUS
  Administered 2019-01-16: 100 mL/h via INTRAVENOUS

## 2019-01-15 NOTE — Lactation Note (Addendum)
This note was copied from a baby's chart. Lactation Consultation Note  Patient Name: Hayley Webb [id number] Date: 01/15/2019 Reason for consult: Initial assessment;Late-preterm 34-36.6wks P2.  Newborn is 36.3 weeks and 4 hours old.  Baby attempted to latch after birth.  He is currently in the nursery for low temperature.  Discussed late preterm feeding policy and expectations with feeding.  Symphony pump set up.  Breasts large and heavy,  Nipples short.  Instructed on breast massage and hand expression.  A few drops of colostrum expressed.  DEBP initiated.  Instructed to feed 8-12 times in 24 hours and post pump and hand express every 3 hours.  Expressed milk can be given to baby by spoon or syringe.  Explained to mom that baby may need formula supplementation.  Breastfeeding consultation services and Caring For Your Late Preterm Baby information given and reviewed.Baby's blood sugar was 28 and baby was given 10 mls of 22 calorie neosure.  Encouraged to call for assist prn.  Maternal Data Has patient been taught Hand Expression?: Yes Does the patient have breastfeeding experience prior to this delivery?: Yes  Feeding Feeding Type: Formula Nipple Type: Slow - flow  LATCH Score                   Interventions Interventions: DEBP  Lactation Tools Discussed/Used Pump Review: Setup, frequency, and cleaning Initiated by:: LMoulden Date initiated:: 01/15/19   Consult Status Consult Status: Follow-up Date: 01/16/19 Follow-up type: In-patient    [name] 01/15/2019, 8:49 AM

## 2019-01-15 NOTE — Anesthesia Postprocedure Evaluation (Signed)
Anesthesia Post Note  Patient: Hayley Webb  Procedure(s) Performed: AN AD [place]     Patient location during evaluation: Mother Baby Anesthesia Type: Epidural Level of consciousness: awake and alert Pain management: pain level controlled Vital Signs Assessment: post-procedure vital signs reviewed and stable Respiratory status: spontaneous breathing, nonlabored ventilation and respiratory function stable Cardiovascular status: stable Postop Assessment: no headache, no backache, epidural receding, no apparent nausea or vomiting, patient able to bend at knees, adequate PO intake and able to ambulate Anesthetic complications: no    Last Vitals:  Vitals:   01/15/19 0810 01/15/19 1200  BP: 133/78 (!) 145/78  Pulse: 81 96  Resp: 18 16  Temp: 36.5 C 36.5 C  SpO2: 98% 98%    Last Pain:  Vitals:   01/15/19 1200  TempSrc: Oral  PainSc:    Pain Goal: Patients Stated Pain Goal: 3 (01/13/19 1950)                 Jabier Mutton

## 2019-01-16 ENCOUNTER — Encounter (HOSPITAL_COMMUNITY): Payer: Self-pay

## 2019-01-16 ENCOUNTER — Ambulatory Visit (HOSPITAL_COMMUNITY): Payer: Medicaid Other

## 2019-01-16 LAB — CBC WITH DIFFERENTIAL/PLATELET
Abs Immature Granulocytes: 0.03 10*3/uL (ref 0.00–0.07)
Basophils Absolute: 0 10*3/uL (ref 0.0–0.1)
Basophils Relative: 0 %
Eosinophils Absolute: 0.2 10*3/uL (ref 0.0–0.5)
Eosinophils Relative: 3 %
HCT: 28.6 % — ABNORMAL LOW (ref 36.0–46.0)
Hemoglobin: 9.1 g/dL — ABNORMAL LOW (ref 12.0–15.0)
Immature Granulocytes: 1 %
Lymphocytes Relative: 36 %
Lymphs Abs: 2.2 10*3/uL (ref 0.7–4.0)
MCH: 27.2 pg (ref 26.0–34.0)
MCHC: 31.8 g/dL (ref 30.0–36.0)
MCV: 85.6 fL (ref 80.0–100.0)
Monocytes Absolute: 0.4 10*3/uL (ref 0.1–1.0)
Monocytes Relative: 6 %
Neutro Abs: 3.3 10*3/uL (ref 1.7–7.7)
Neutrophils Relative %: 54 %
Platelets: 278 10*3/uL (ref 150–400)
RBC: 3.34 MIL/uL — ABNORMAL LOW (ref 3.87–5.11)
RDW: 14.2 % (ref 11.5–15.5)
WBC: 6.1 10*3/uL (ref 4.0–10.5)
nRBC: 0 % (ref 0.0–0.2)

## 2019-01-16 LAB — COMPREHENSIVE METABOLIC PANEL
ALT: 15 U/L (ref 0–44)
AST: 18 U/L (ref 15–41)
Albumin: 2.4 g/dL — ABNORMAL LOW (ref 3.5–5.0)
Alkaline Phosphatase: 117 U/L (ref 38–126)
Anion gap: 7 (ref 5–15)
BUN: <5 mg/dL — ABNORMAL LOW (ref 6–20)
CO2: 21 mmol/L — ABNORMAL LOW (ref 22–32)
Calcium: 8.6 mg/dL — ABNORMAL LOW (ref 8.9–10.3)
Chloride: 108 mmol/L (ref 98–111)
Creatinine, Ser: 0.61 mg/dL (ref 0.44–1.00)
GFR calc Af Amer: >60 mL/min (ref >60)
GFR calc non Af Amer: >60 mL/min (ref >60)
Glucose, Bld: 86 mg/dL (ref 70–99)
Potassium: 3.4 mmol/L — ABNORMAL LOW (ref 3.5–5.1)
Sodium: 136 mmol/L (ref 135–145)
Total Bilirubin: 0.3 mg/dL (ref 0.3–1.2)
Total Protein: 6 g/dL — ABNORMAL LOW (ref 6.5–8.1)

## 2019-01-16 MED ORDER — FERROUS SULFATE 325 (65 FE) MG PO TABS: 325.0000 mg | ORAL_TABLET | Freq: Two times a day (BID) | ORAL | Status: DC

## 2019-01-16 MED ORDER — ACETAMINOPHEN 325 MG PO TABS
650.0000 mg | ORAL_TABLET | Freq: Four times a day (QID) | ORAL | Status: DC | PRN
  Administered 2019-01-16: 650 mg via ORAL
  Filled 2019-01-16: qty 2

## 2019-01-16 NOTE — Lactation Note (Signed)
This note was copied from a baby's chart. Lactation Consultation Note  Patient Name: Hayley Webb [id number] Date: 01/16/2019 Reason for consult: Follow-up assessment;Late-preterm 34-36.6wks  P2 mother whose infant is now 43 hours old.  This is a LPTI at 36+3 weeks.  Mother breast fed her first child (now 31 years old) for 6 months.  Reviewed LPTI policy with mother, focusing on increasing feeding volume supplementation today.  Encouraged STS as much as possible.  Feeding plan established for the day with mother.  Baby will feed again at 1030 (or earlier if he shows feeding cues).  Reviewed cues with mother. Mother will put baby to breast, followed by supplementation and pumping.  She has not been pumping consistently.  Educated her on the importance of being diligent about pumping after every breast feeding session.  Mother will feed back any EBM she obtains to baby.  She will awaken at the third hour if baby remains sleepy and latch STS.  Suggested mother call her RN/LC for latch assistance with the next feeding.  Mother verbalized understanding.  Mother has a DEBP for home use.  Mother was interested in discharge today but pediatrician will have baby stay until tomorrow.  Discussed with mother why this is so important and she agrees.  She wants to do "whatever is best" for baby.  Her boyfriend will return later.   Maternal Data Formula Feeding for Exclusion: No Has patient been taught Hand Expression?: Yes Does the patient have breastfeeding experience prior to this delivery?: Yes  Feeding    LATCH Score                   Interventions    Lactation Tools Discussed/Used Pump Review: (Mother stated she did not need any review about pumping)   Consult Status Consult Status: Follow-up Date: 01/17/19 Follow-up type: In-patient    Krystn Dermody R Adarsh Mundorf 01/16/2019, 9:15 AM

## 2019-01-16 NOTE — Progress Notes (Signed)
Post Partum Day 1 Subjective: no complaints, up ad lib, voiding and tolerating PO. RN called to report patient with intractable cramping even after high dose oxycodone. Encouraged use of warming packs instead of additional medication. Patient reports significant relief with the hot packs. She denies symptoms of preeclampsia: headache, visual changes, RUQ pain.   Objective: Vitals:   01/16/19 0300 01/16/19 0313 01/16/19 0400 01/16/19 0445  BP:  125/77  122/65  Pulse:    84  Resp: 18 18 20 20   Temp:  97.9 F (36.6 C)    TempSrc:  Oral    SpO2:  100%    Weight:      Height:      ' Physical Exam:  General: alert and cooperative Lochia: appropriate Uterine Fundus: firm Incision: n/a DVT Evaluation: No evidence of DVT seen on physical exam. Negative Homan's sign. No cords or calf tenderness. No significant calf/ankle edema.  Recent Labs    01/14/19 1729 01/15/19 0705  HGB 10.1* 10.3*  HCT 30.9* 31.4*    Assessment/Plan: Plan for discharge today vs tomorrow depending on patient's blood pressures today and baby's discharge   LOS: 4 days   Marikay Alar 01/16/2019, 6:51 AM

## 2019-01-16 NOTE — Progress Notes (Signed)
Hayley Webb, CNW notified, patient c/o cramping unrelieved by Oxycodone.  No new orders.

## 2019-01-16 NOTE — Progress Notes (Signed)
MOB was referred for history of depression/anxiety. * Referral screened out by Clinical Social Worker because none of the following criteria appear to apply: ~ History of anxiety/depression during this pregnancy, or of post-partum depression following prior delivery. No concerns of depression and or anxiety listed in OB records. Per H&P "Anxiety - doing good now".  ~ Diagnosis of anxiety and/or depression within last 3 years. Per chart review and OB records review, MOB's depression and anxiety diagnosis dates back to 2011.  OR * MOB's symptoms currently being treated with medication and/or therapy.  Please contact the Clinical Social Worker if needs arise, by [facility] request, or if MOB scores greater than 9/yes to question 10 on [place] Postpartum Depression Screen.  Abundio Miu, [place] Worker [facility] Cell#: [phone]

## 2019-01-17 DIAGNOSIS — O9081 Anemia of the puerperium: Secondary | ICD-10-CM

## 2019-01-17 HISTORY — DX: Anemia of the puerperium: O90.81

## 2019-01-17 IMAGING — DX DG CHEST 2V
2 series · 2 of 2 positions shown · non-contrast
Comparison: 01/12/2017, 01/01/2017 and earlier, including CTA chest
01/05/2017, 12/29/2016, 12/26/2016.

CLINICAL DATA: Acute onset of right-sided chest pain yesterday.
Patient diagnosed with bilateral pulmonary emboli on 12/29/2016.

EXAM:
CHEST  2 VIEW

[chest pa]
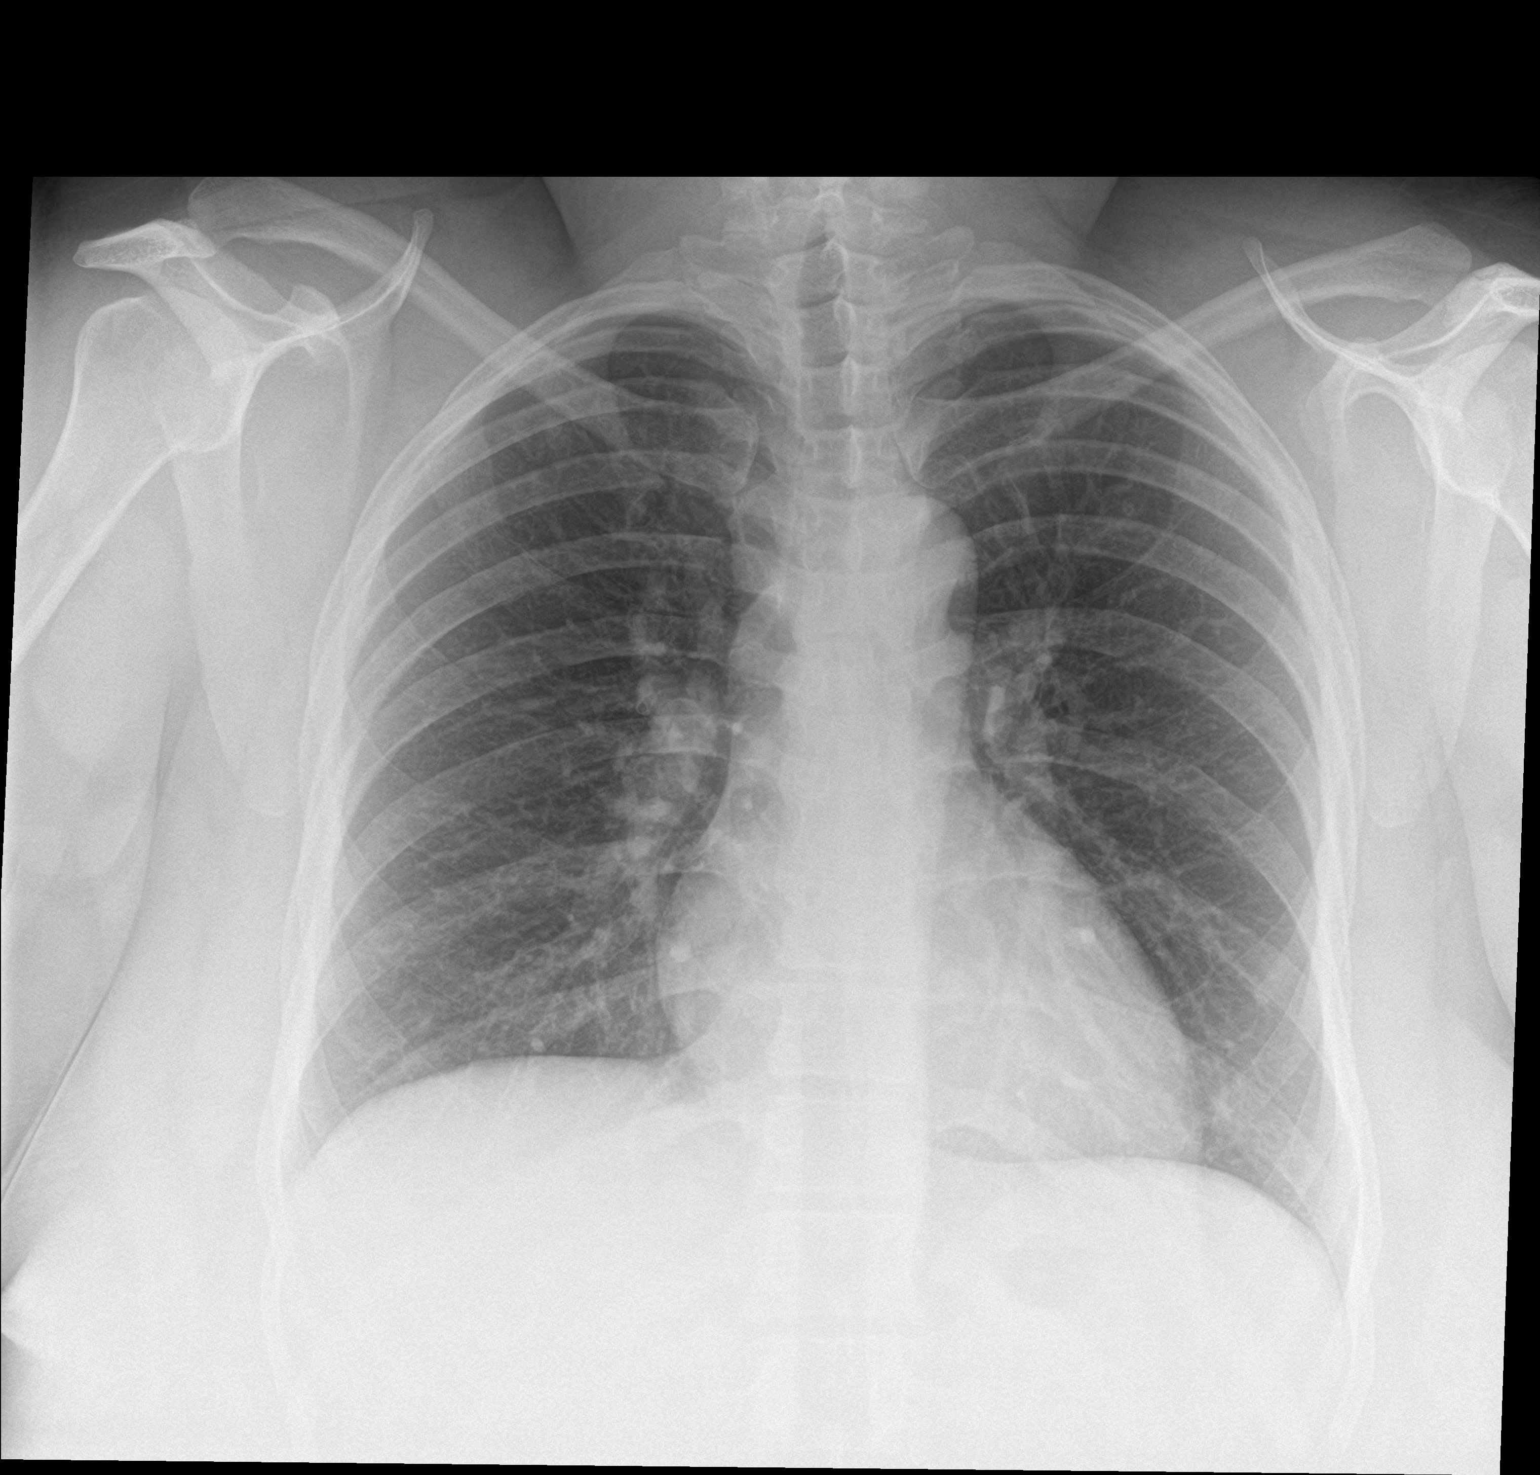

[chest lat]
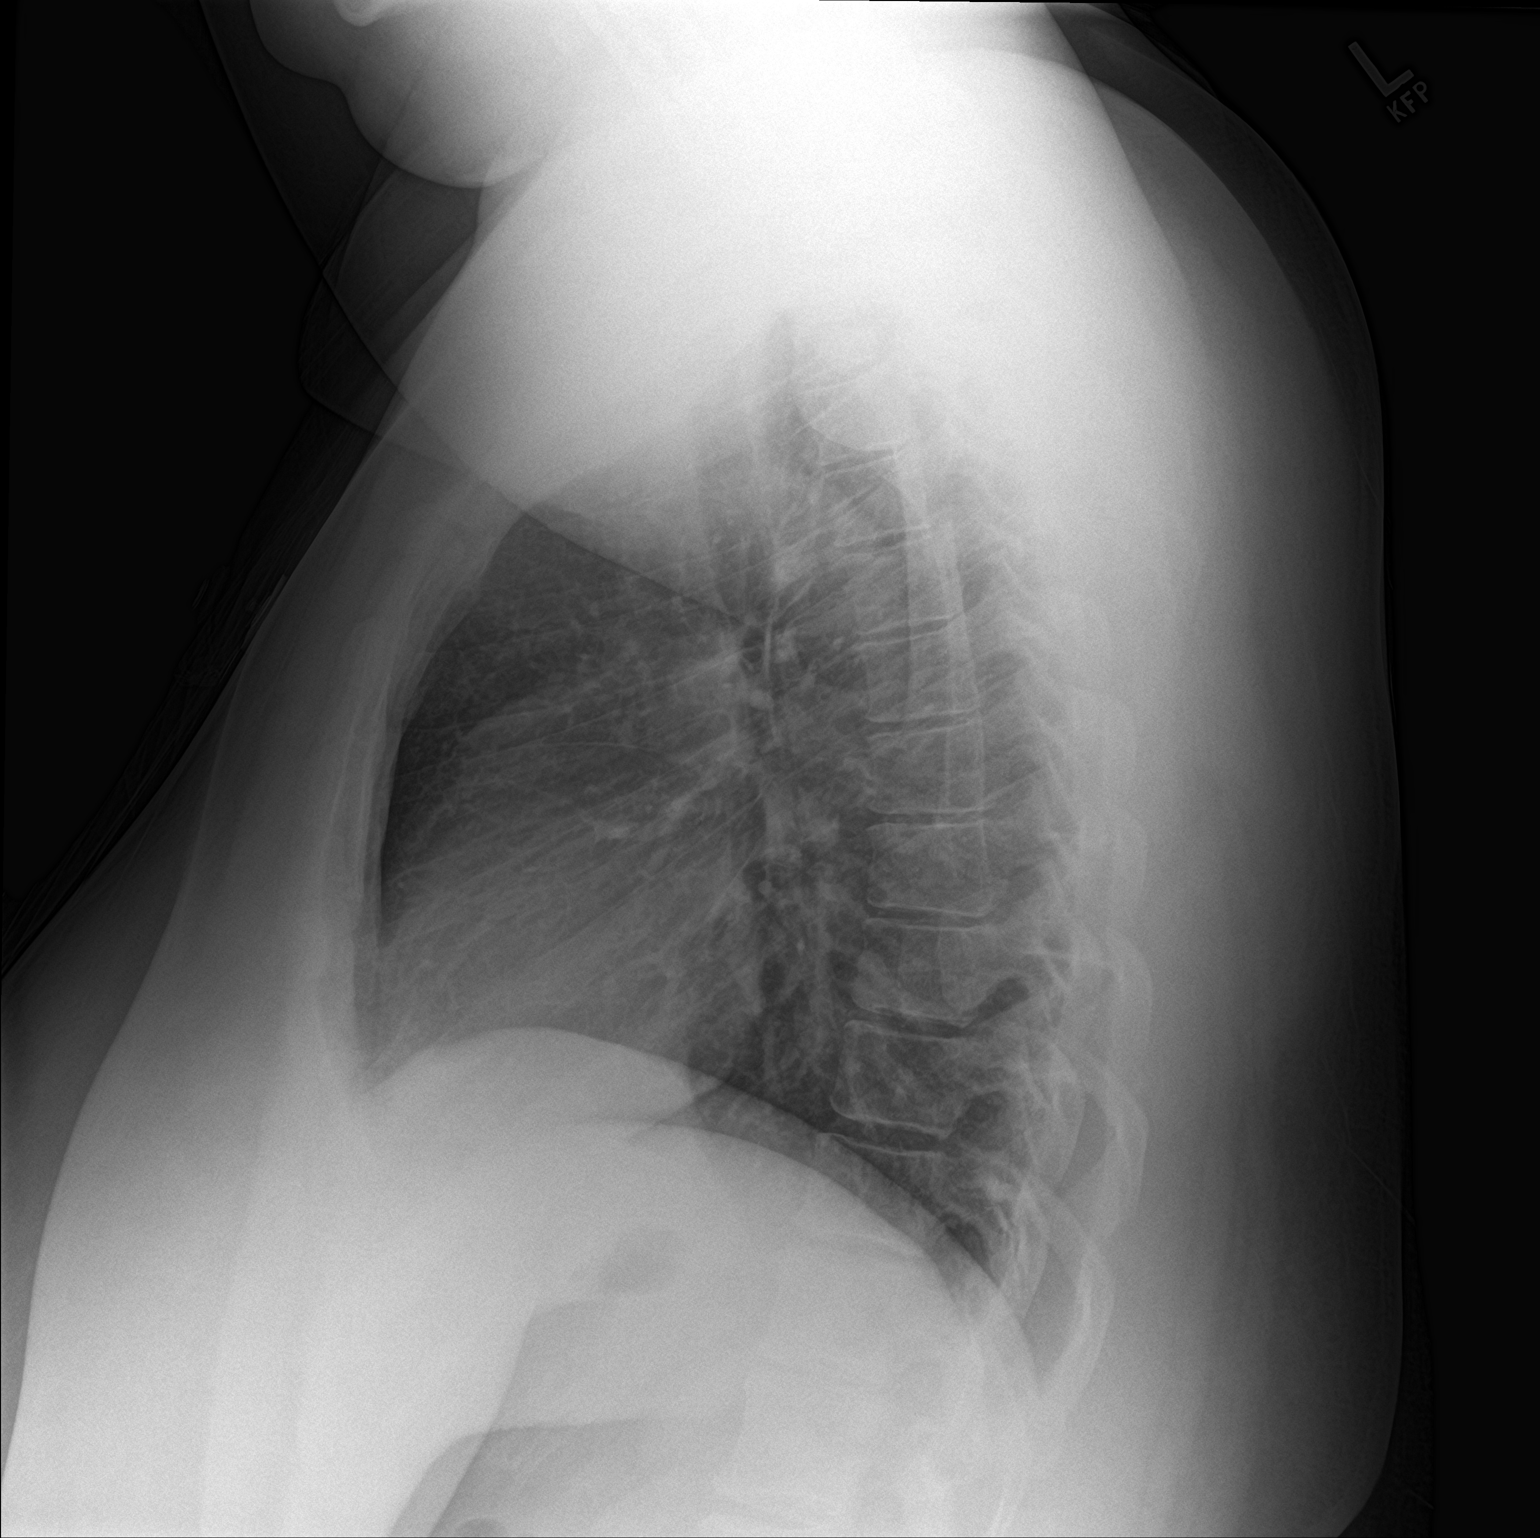

[2 of 2 positions shown; findings below may reference images not displayed]

FINDINGS: Cardiomediastinal silhouette unremarkable, unchanged. Lungs clear.
Bronchovascular markings normal. Pulmonary vascularity normal. No
visible pleural effusions. No pneumothorax. Visualized bony thorax
intact.
IMPRESSION: Normal examination.

## 2019-01-17 MED ORDER — FERROUS SULFATE 325 (65 FE) MG PO TABS: 325.0000 mg | ORAL_TABLET | Freq: Two times a day (BID) | ORAL | 3 refills | Status: DC

## 2019-01-17 MED ORDER — BUTALBITAL-APAP-CAFFEINE 50-325-40 MG PO TABS: 2.0000 | ORAL_TABLET | Freq: Four times a day (QID) | ORAL | 0 refills | Status: DC | PRN

## 2019-01-17 MED ORDER — LABETALOL HCL 200 MG PO TABS: 400.0000 mg | ORAL_TABLET | Freq: Two times a day (BID) | ORAL | 0 refills | Status: DC

## 2019-01-17 NOTE — Discharge Summary (Addendum)
SVD OB Discharge Summary     Patient Name: Hayley Webb DOB: [birth year] MRN: [id number]  Date of admission: 01/11/2019 Delivering MD: Sanjuana Kava  Date of delivery: 01/15/2019 Type of delivery: SVD  Newborn Data: Sex: Baby female  Circumcision: out pt desired Live born female  Birth Weight: 6 lb 5.1 oz (2865 g) APGAR: 7, 8  Newborn Delivery   Birth date/time: 01/15/2019 04:46:00 Delivery type: Vaginal, Spontaneous      Feeding: breast and bottle Infant being discharge to home with mother in stable condition.   Admitting diagnosis: 36 wks ctx every 2 min and having pressure Intrauterine pregnancy: [redacted]w[redacted]d    Secondary diagnosis:  Active Problems:   Threatened preterm labor   Chronic hypertension complicating or reason for care during pregnancy, third trimester   Postpartum anemia   Normal postpartum course                                Complications: None                                                              Intrapartum Procedures: spontaneous vaginal delivery and On magnesium ofr CHTN with SI PrE with SF  Postpartum Procedures: none Complications-Operative and Postpartum: periurtheral laceration and 24 H PP magnesium, on lovenox for reoccuring H/O PE, lovenox PP 154mBID. Augmentation: AROM, Pitocin and Cytotec   History of Present Illness: Ms. AnMerryn Thakers a [age].o. female, [id number] presents at 3670w3deks gestation. [facility]  Her pregnancy has been complicated by:  Patient Active Problem List   Diagnosis Date Noted  . Postpartum anemia 01/17/2019  . Normal postpartum course 01/17/2019  . Chronic hypertension complicating or reason for care during pregnancy, third trimester 01/12/2019  . Threatened preterm labor 12/25/2018  . Threatened preterm labor, third trimester 12/23/2018  . Preterm labor 12/23/2018  . Depressive disorder 12/23/2018  . Anxiety disorder 12/23/2018  . Sicca  syndrome (HCCHanna2/10/2018  . Chronic hypertension affecting pregnancy 07/25/2018  . Pregnant 07/01/2018  . Maternal obesity affecting pregnancy, antepartum 06/11/2018  . Allergy to shellfish 06/11/2018  . Chest pain 04/04/2017  . On continuous oral anticoagulation 04/04/2017  . 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HCCPowhatan9/06/2017  . Nonintractable headache 04/04/2017  . Altered thought processes 04/04/2017  . Word finding difficulty 04/04/2017  . Antiphospholipid antibody syndrome (HCCArthur7/21/2018  . Pulmonary embolus (HCCFentress7/20/2018  . Pulmonary embolism (HCCOreana6/11/2016  . Shortness of breath   . History of pulmonary embolus (PE) 07/24/2016  . Vaginal delivery--VE assist 10/25/2013  . 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 Obesity-BMI 47 05/09/2013  . Hirsutism 10/02/2012  . STD (female) 01/18/2012  . Lupus (HCCHasty . Asthma   . HSV infection     Hospital course:  Induction of Labor With Vaginal Delivery   32 19o. yo [id number] 36w70w3d admitted to the hospital 01/11/2019 for induction of labor.  Indication for induction: CHTN with superimposed preE with SF due to HA and SR BP. .  [facility], pt  had taken po terbutaline at home with no improvement, pt also had CHTN with HA, and no pain relief, PIH labs were unremarkable and have stayed unremarkable her entire stay, she was on 262m labetalol at home but was switch to 4039mBID due to severe range BP upon admission, pt denies HA, CP, SOB, no RUQ pain or vision changes. Pt stable. BP now of 134/88 on 40058mID of PO labetalol. Pt eventually HA did not improve and became worse, pt was schedule for IOL @ 36.wg for preterm delivery, pt was on magnesium during labor and continued for 24 hours PP. Pt also on lovenox 150m46mD for full anticoagulation for h/i reoccurring PE, pt currently stable with no CP or COB, normal po2 100%, pt  endorses having plenty of home meds to take and declines new script. Pt will continue to take home meds. Pt was switched to heparin 24 hours prior to her IOL start time, but then switched back once she became PP. Pt on plaquenil 200mg76mly for lupus and will continue to take home meds. Pt has H/O anxiety and received atarax in hospital but declined meds PP. Denies SI/HI, mood happy. 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 Pt has H/O HA, she informed me they started before pregnancy but informed MFM they did not, however, I prescribed her home med for only #14 pills of Fioricet. Pt also denied having an allergy to tylenol, stated it must have gone aware, but has been able to tolerate it in hospital without an allergic reaction.   Patient had an uncomplicated labor course as follows: Membrane Rupture Time/Date: 9:59 PM ,01/14/2019   Intrapartum Procedures: Episiotomy: None [1]                                         Lacerations:  Periurethral [8]  Patient had delivery of a Viable infant.  Information for the patient's newborn:  FosteNikayla, Madaris9[[id number]]ivery Method: Vag-Spont    01/15/2019  Details of delivery can be found in separate delivery note.  Patient had a routine postpartum course. Patient is discharged home 01/17/19. Postpartum Day # 2 : S/P NSVD due to IOL for previously mentioned. Patient up ad lib, denies syncope or dizziness. Reports consuming regular diet without issues and denies N/V. Patient reports 0 bowel movement + passing flatus.  Denies issues with urination and reports bleeding is "lighter."  Patient is breast and bottlefeeding and reports going well.  Desires cooper IUD for postpartum contraception.  Pain is being appropriately managed with use of po meds. Pt became anemic PP with hgb drop from 10.3-9.1, but asymptomatic, on po iron, will continue at home.    Physical exam  Vitals:   01/16/19 1550 01/16/19 1939 01/16/19 2322  01/17/19 0316  BP: 134/68 140/87 (!) 134/93 134/88  Pulse: 97 96 100 96  Resp: 19  20 20   Temp:  98.1 F (36.7 C) 97.8 F (36.6 C) 97.7 F (36.5 C)  TempSrc:  Oral Oral Oral  SpO2:  93% 97% 98%  Weight:      Height:       General: alert, cooperative and no distress  CV: RRR, no murmurs.  Lungs: CTA Bi-lat Lochia: appropriate Uterine Fundus: firm Perineum: approximate, no hematomas noted.  DVT Evaluation: No evidence of DVT seen on physical exam. Negative Homan's sign.  No cords or calf tenderness. No significant calf/ankle edema.  Labs: Lab Results  Component Value Date   WBC 6.1 01/16/2019   HGB 9.1 (L) 01/16/2019   HCT 28.6 (L) 01/16/2019   MCV 85.6 01/16/2019   PLT 278 01/16/2019   CMP Latest Ref Rng & Units 01/16/2019  Glucose 70 - 99 mg/dL 86  BUN 6 - 20 mg/dL <5(L)  Creatinine 0.44 - 1.00 mg/dL 0.61  Sodium 135 - 145 mmol/L 136  Potassium 3.5 - 5.1 mmol/L 3.4(L)  Chloride 98 - 111 mmol/L 108  CO2 22 - 32 mmol/L 21(L)  Calcium 8.9 - 10.3 mg/dL 8.6(L)  Total Protein 6.5 - 8.1 g/dL 6.0(L)  Total Bilirubin 0.3 - 1.2 mg/dL 0.3  Alkaline Phos 38 - 126 U/L 117  AST 15 - 41 U/L 18  ALT 0 - 44 U/L 15    Date of discharge: 01/17/2019 Discharge Diagnoses: preterm delivery for [facility] with superimposed Preeclampsia.  Discharge instruction: per After Visit Summary and "Baby and Me Booklet".  After visit meds:    Activity:           unrestricted and pelvic rest Advance as tolerated. Pelvic rest for 6 weeks.  Diet:                routine Medications: PNV, Colace, Iron and tylenol, pt denies allergy been taking in hposital.  Postpartum contraception: IUD Paragard Condition:  Pt discharge to home with baby in stable See note above for further instruction on home meds and plans.   Meds: Allergies as of 01/17/2019      Reactions   Bactrim Anaphylaxis, Hives   Hydrocodone-acetaminophen Anaphylaxis   Nifedipine Swelling, Anaphylaxis   Swelling of tongue   Pineapple  Itching   Prednisone Anaphylaxis, Hives   Has been on Dexamethasone without issue.   Shellfish Allergy Itching   Sulfamethoxazole-trimethoprim Other (See Comments)   Complications due to lupus   Nsaids Other (See Comments)   Has Lupus, reccommended to avoid NSAIDs   Other Itching   Shrimp:throat itching "can eat other shellfish"   Vicodin [hydrocodone-acetaminophen] Hives, Swelling   Can take Percocet and Tylenol without reaction.      Medication List    STOP taking these medications   aspirin EC 81 MG tablet   Bonjesta 20-20 MG Tbcr Generic drug: Doxylamine-Pyridoxine ER   metoCLOPramide 10 MG tablet Commonly known as: REGLAN   ondansetron 4 MG tablet Commonly known as: ZOFRAN   promethazine 25 MG tablet Commonly known as: PHENERGAN   terbutaline 2.5 MG tablet Commonly known as: BRETHINE   terconazole 0.4 % vaginal cream Commonly known as: TERAZOL 7   valACYclovir 500 MG tablet Commonly known as: VALTREX     TAKE these medications   albuterol 108 (90 Base) MCG/ACT inhaler Commonly known as: VENTOLIN HFA Inhale 2 puffs into the lungs every 6 (six) hours as needed for wheezing or shortness of breath. What changed: Another medication with the same name was removed. Continue taking this medication, and follow the directions you see here.   butalbital-acetaminophen-caffeine 50-325-40 MG tablet Commonly known as: FIORICET Take 2 tablets by mouth every 6 (six) hours as needed for headache.   cholecalciferol 1000 units tablet Commonly known as: VITAMIN D Take 1,000 Units by mouth daily.   enoxaparin 40 MG/0.4ML injection Commonly known as: LOVENOX Inject 1.5 mLs (150 mg total) into the skin every 12 (twelve) hours.   ferrous sulfate 325 (65 FE) MG tablet Take 1 tablet (325 mg total)  by mouth 2 (two) times daily with a meal.   hydroxychloroquine 200 MG tablet Commonly known as: PLAQUENIL Take 100 mg by mouth 2 (two) times daily.   labetalol 200 MG  tablet Commonly known as: NORMODYNE Take 2 tablets (400 mg total) by mouth 2 (two) times daily.   multivitamin-prenatal 27-0.8 MG Tabs tablet Take 1 tablet by mouth daily at 12 noon.       Discharge Follow Up:  Follow-up [facility] Follow up.   Specialty: Obstetrics and Gynecology Why: Pleasee call and make an appoint for one week BP check and baby circ appointment. Also 6 weeks PPV for IUD paragaurd.  Contact information: [address] [place] [id number] [place], NP-C, [name] 01/17/2019, 3:48 AM  Noralyn Pick, FNP

## 2019-01-17 NOTE — Lactation Note (Signed)
This note was copied from a baby's chart. Lactation Consultation Note  Patient Name: [name] NIDPO'E Date: 01/17/2019 Reason for consult: Follow-up assessment;Late-preterm 34-36.6wks  P2 mother whose infant is now 37 hours.  This ia a LPTI at 36+3 weeks.  Mother breaset fed her first child (now 31 years old) for 6 months.  Baby was asleep in mother's arms when I arrived.  Mother has been attempting to latch baby to breast but stated that he usually only feeds for a couple of minutes.  She is continuing to pump after feeding attempts and has gotten only a few drops of colostrum to date.  Encouraged continued hand expression before/after feedings to help increase milk supply.    Suggested mother return for OP lactation services due to LPTI and not breast feeding well yet.  Explained how this process works and how mother can call for an appointment.  Mother seemed interested in this option and may call for a visit.  She has private insurance and I suggested she call her insurance company for coverage benefits.    Engorgement prevention/treatment reviewed.  Mother has a manual pump and a DEBP for home use.  She has our OP phone number for questions/concerns after discharge.  She is looking forward to being discharged today.  RN updated.   Maternal Data Formula Feeding for Exclusion: No Has patient been taught Hand Expression?: Yes Does the patient have breastfeeding experience prior to this delivery?: Yes  Feeding Feeding Type: Bottle Fed - Formula  LATCH Score                   Interventions    Lactation Tools Discussed/Used WIC Program: No   Consult Status Consult Status: Complete Date: 01/17/19 Follow-up type: Call as needed    [name] 01/17/2019, 8:22 AM

## 2019-01-20 ENCOUNTER — Telehealth: Payer: Self-pay | Admitting: Hematology and Oncology

## 2019-01-20 NOTE — Telephone Encounter (Signed)
I left a message regarding video visit  °

## 2019-01-20 NOTE — Assessment & Plan Note (Signed)
Pulmonary embolism diagnosed 12/30/2016 when she presented with chest pain and leg pains: Right lower lobe pulmonary artery Initial testing was positive for lupus anticoagulant but repeat testing was negative. Previous treatment: 1. Eliquis 2.  Lovenox during pregnancy  Patient delivered a healthy female boy 01/15/2019 vaginal delivery Plan: Resume Eliquis if she will not be breast-feeding. If she plans on breast-feeding then we would consider warfarin.

## 2019-01-23 ENCOUNTER — Telehealth: Payer: Self-pay | Admitting: Hematology and Oncology

## 2019-01-23 NOTE — Telephone Encounter (Signed)
Left message for patient for a mychart visit for pre reg

## 2019-01-25 NOTE — Progress Notes (Signed)
error 

## 2019-01-27 ENCOUNTER — Inpatient Hospital Stay: Payer: 59 | Attending: Hematology and Oncology | Admitting: Hematology and Oncology

## 2019-01-27 DIAGNOSIS — D6861 Antiphospholipid syndrome: Secondary | ICD-10-CM

## 2019-05-30 ENCOUNTER — Other Ambulatory Visit: Payer: Self-pay

## 2019-05-30 DIAGNOSIS — Z20822 Contact with and (suspected) exposure to covid-19: Secondary | ICD-10-CM

## 2019-05-31 LAB — NOVEL CORONAVIRUS, NAA: SARS-CoV-2, NAA: NOT DETECTED

## 2019-07-29 DIAGNOSIS — Z01419 Encounter for gynecological examination (general) (routine) without abnormal findings: Secondary | ICD-10-CM | POA: Diagnosis not present

## 2019-07-29 DIAGNOSIS — R350 Frequency of micturition: Secondary | ICD-10-CM | POA: Diagnosis not present

## 2019-07-29 DIAGNOSIS — Z113 Encounter for screening for infections with a predominantly sexual mode of transmission: Secondary | ICD-10-CM | POA: Diagnosis not present

## 2019-07-29 DIAGNOSIS — Z124 Encounter for screening for malignant neoplasm of cervix: Secondary | ICD-10-CM | POA: Diagnosis not present

## 2019-07-29 DIAGNOSIS — Z0000 Encounter for general adult medical examination without abnormal findings: Secondary | ICD-10-CM | POA: Diagnosis not present

## 2019-07-29 DIAGNOSIS — N898 Other specified noninflammatory disorders of vagina: Secondary | ICD-10-CM | POA: Diagnosis not present

## 2019-10-04 ENCOUNTER — Ambulatory Visit (INDEPENDENT_AMBULATORY_CARE_PROVIDER_SITE_OTHER): Payer: Medicaid Other

## 2019-10-04 ENCOUNTER — Other Ambulatory Visit: Payer: Self-pay

## 2019-10-04 ENCOUNTER — Ambulatory Visit (HOSPITAL_COMMUNITY)
Admission: EM | Admit: 2019-10-04 | Discharge: 2019-10-04 | Disposition: A | Discharge status: Home / Self Care | Payer: Medicaid Other | Attending: Emergency Medicine | Admitting: Emergency Medicine

## 2019-10-04 ENCOUNTER — Encounter (HOSPITAL_COMMUNITY): Payer: Self-pay | Admitting: *Deleted

## 2019-10-04 DIAGNOSIS — W19XXXA Unspecified fall, initial encounter: Secondary | ICD-10-CM

## 2019-10-04 DIAGNOSIS — S99912A Unspecified injury of left ankle, initial encounter: Secondary | ICD-10-CM | POA: Diagnosis not present

## 2019-10-04 DIAGNOSIS — M25572 Pain in left ankle and joints of left foot: Secondary | ICD-10-CM

## 2019-10-04 DIAGNOSIS — M25561 Pain in right knee: Secondary | ICD-10-CM

## 2019-10-04 DIAGNOSIS — S93422A Sprain of deltoid ligament of left ankle, initial encounter: Secondary | ICD-10-CM

## 2019-10-04 DIAGNOSIS — S8001XA Contusion of right knee, initial encounter: Secondary | ICD-10-CM | POA: Diagnosis not present

## 2019-10-04 DIAGNOSIS — M7989 Other specified soft tissue disorders: Secondary | ICD-10-CM | POA: Diagnosis not present

## 2019-10-04 DIAGNOSIS — S8991XA Unspecified injury of right lower leg, initial encounter: Secondary | ICD-10-CM | POA: Diagnosis not present

## 2019-10-04 NOTE — ED Triage Notes (Signed)
Pt reports slipping on wet floor at store yesterday, causing pt to "basically do a curtsy down to the floor".  C/O generalized soreness today, with thoracic and posterior neck pain, left knee and left ankle pain.  Pt ambulatory.

## 2019-10-04 NOTE — ED Provider Notes (Signed)
HPI  SUBJECTIVE:  Hayley Webb is a 34 y.o. female who presents with bilateral knee and ankle pain after having a slip and fall in a grocery store yesterday.  Patient states that she slipped on some [employer] and crossed her right knee behind her left, falling primarily onto her right knee and then onto both of her ankles.  She reports right knee and left ankle pain described as constant dull pressure-like with occasional intermittent sharp stabbing pain that lasts about 10 to 15 minutes.  She reports swelling of the bilateral knees, ankles.  States that she felt a "pop" in her right knee and states that her knee felt unstable when she stood up yesterday.  It has not given way on her.  She reports bilateral ankle weakness and tingling in her feet today.  She also reports neck and back pain but denies any direct trauma to these areas.  No bruising of the knees or ankles that she has noticed, erythema, distal numbness.  She was unable to bear weight on her right knee or left ankle immediately after the incident but has been ambulatory since.  She was told by the store that she needed to get x-rays further incident report.  She tried Epson salt baths, hot and cold compresses heating pad Flexeril without improvement in her symptoms.  Symptoms are worse with standing and weightbearing.  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  No history of osteoporosis kidney disease diabetes.  LMP: 2/12.  Denies the possibility of being pregnant.  TFT:DDUKGURKY, [name], MD   Past Medical History:  Diagnosis Date  . Anxiety    doing good now  . Asthma   . Headache(784.0)   . HSV infection   . Hypertension   . Infection    UTI  . Lupus (HCC)    dx age 56  . Pulmonary embolism (HCC) 06/4/82011  . 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 ADENOIDECTOMY    . TONSILLECTOMY AND ADENOIDECTOMY  1998  .  WISDOM TOOTH EXTRACTION      Family History  Problem Relation Age of Onset  . Diabetes Maternal Aunt   . Cancer Maternal Grandmother 72       COLON CA  . Diabetes Maternal Grandmother   . Hypertension Maternal Grandfather   . Cancer Maternal Grandfather        prostate  . Asthma Mother   . Asthma Brother   . Cancer Paternal Grandmother        breast  . Lupus Paternal Grandmother   . Lupus Paternal Aunt   . Stroke Paternal Aunt   . Anesthesia problems Neg Hx   . Hypotension Neg Hx   . Malignant hyperthermia Neg Hx   . Pseudochol deficiency Neg Hx     Social History   Tobacco Use  . Smoking status: Never Smoker  . Smokeless tobacco: Never Used  Substance Use Topics  . Alcohol use: No  . Drug use: No    No current facility-administered medications for this encounter.  Current Outpatient Medications:  .  cholecalciferol (VITAMIN D) 1000 units tablet, Take 1,000 Units by mouth daily., Disp: , Rfl:  .  cyclobenzaprine (FLEXERIL) 10 MG tablet, Take 10 mg by mouth 3 (three) times daily as needed for muscle spasms., Disp: , Rfl:  .  enoxaparin (LOVENOX) 40 MG/0.4ML injection, Inject 1.5 mLs (150 mg total)  into the skin every 12 (twelve) hours., Disp: , Rfl:  .  hydroxychloroquine (PLAQUENIL) 200 MG tablet, Take 100 mg by mouth 2 (two) times daily., Disp: , Rfl:  .  labetalol (NORMODYNE) 200 MG tablet, Take 2 tablets (400 mg total) by mouth 2 (two) times daily., Disp: 60 tablet, Rfl: 0 .  methylPREDNISolone (MEDROL PO), Take by mouth., Disp: , Rfl:  .  Multiple Vitamin (MULTIVITAMIN) capsule, Take 1 capsule by mouth daily., Disp: , Rfl:  .  albuterol (PROVENTIL HFA;VENTOLIN HFA) 108 (90 Base) MCG/ACT inhaler, Inhale 2 puffs into the lungs every 6 (six) hours as needed for wheezing or shortness of breath., Disp: , Rfl:  .  butalbital-acetaminophen-caffeine (FIORICET) 50-325-40 MG tablet, Take 2 tablets by mouth every 6 (six) hours as needed for headache., Disp: 14 tablet, Rfl: 0 .   ferrous sulfate 325 (65 FE) MG tablet, Take 1 tablet (325 mg total) by mouth 2 (two) times daily with a meal., Disp: 60 tablet, Rfl: 3 .  Prenatal Vit-Fe Fumarate-FA (MULTIVITAMIN-PRENATAL) 27-0.8 MG TABS tablet, Take 1 tablet by mouth daily at 12 noon., Disp: , Rfl:   Allergies  Allergen Reactions  . Bactrim Anaphylaxis and Hives  . Hydrocodone-Acetaminophen Anaphylaxis  . Nifedipine Swelling and Anaphylaxis    Swelling of tongue  . Pineapple Itching  . Prednisone Anaphylaxis and Hives    Has been on Dexamethasone without issue.  . Shellfish Allergy Itching  . Sulfamethoxazole-Trimethoprim Other (See Comments)    Complications due to lupus  . Nsaids Other (See Comments)    Has Lupus, reccommended to avoid NSAIDs  . Other Itching    Shrimp:throat itching "can eat other shellfish"  . Vicodin [Hydrocodone-Acetaminophen] Hives and Swelling    Can take Percocet and Tylenol without reaction.     ROS  As noted in HPI.   Physical Exam  BP 136/84   Pulse 78   Temp 97.6 F (36.4 C) (Oral)   Resp 16   LMP 09/05/2019 (Exact Date) Comment: "Supposed to start period tomorrow"  Breastfeeding No Comment: patient denies pregnancy, refused a pregnancy test, stated her period should start tomorrow  Constitutional: Well developed, well nourished, no acute distress Eyes:  EOMI, conjunctiva normal bilaterally HENT: Normocephalic, atraumatic,mucus membranes moist Respiratory: Normal inspiratory effort Cardiovascular: Normal rate GI: nondistended skin: No rash, skin intact Musculoskeletal:   R Knee ROM decreased due to pain, Flexion  intact, Patella tender,Patellar tendon tender, Medial joint tender, Lateral joint  tender, Popliteal region NT, Varus MCL stress testing stable, Valgus LCL stress testing stable, McMurray's testing normal, Lachman's negative. Distal NVI with intact baseline sensation / motor / pulse distal to knee.  No appreciable effusion. No erythema. No increased  temperature. No crepitus.  Positive contusion inferior to the patella  Right ankle normal, no ligamentous or bony tenderness.  No swelling, bruising.  No pain with passive range of motion.  Negative squeeze test.  Negative proximal fibular tenderness.  Anterior drawer test stable.  DP 2+.  Patient able to move toes up and down.  Sensation intact.  No tenderness over the fifth metatarsal.  Patient able to bear weight in department  Left knee normal: No bony or ligamentous tenderness.  No bruising erythema edema pain with passive range of motion.  Distally neurovascularly intact with intact baseline sensation/motor/pulse distal to the knee.  No contusion.    L Ankle Proximal fibula NT , Distal fibula tender, Medial malleolus tender,  Deltoid ligament medially  tender,  Lateral ligaments NT, ATFL laterally  NT, calcaneofibular ligament laterally NT , posterior tablofibular ligament laterally NT Achilles NT, calcaneus  NT,  Proximal 5th metatarsal NT, Midfoot NT, distal NVI with baseline sensation / motor to foot with CR<2 seconds. no pain with dorsiflexion/plantar flexion. Pain with inversion. no pain with eversion. - bruising. - squeeze test .  Ant drawer test stable. Pt able to bear weight in dept.  Neurologic: Alert & oriented x 3, no focal neuro deficits Psychiatric: Speech and behavior appropriate   ED Course   Medications - No data to display  Orders Placed This Encounter  Procedures  . DG Knee AP/LAT W/Sunrise Right    Standing Status:   Standing    Number of Occurrences:   1    Order Specific Question:   Reason for Exam (SYMPTOM  OR DIAGNOSIS REQUIRED)    Answer:   fall, bony tnderness r/o fx effusion  . DG Ankle Complete Left    Standing Status:   Standing    Number of Occurrences:   1    Order Specific Question:   Reason for Exam (SYMPTOM  OR DIAGNOSIS REQUIRED)    Answer:   fall, bony tnderness r/o fx effusion  . Apply knee sleeve    Or ace wrap    Standing Status:   Standing     Number of Occurrences:   1    Order Specific Question:   Laterality    Answer:   Right  . Apply ASO ankle    Standing Status:   Standing    Number of Occurrences:   1    Order Specific Question:   Laterality    Answer:   Left    No results found for this or any previous visit (from the past 24 hour(s)). DG Ankle Complete Left  Result Date: 10/04/2019 CLINICAL DATA:  Fall last night with left ankle injury with pain EXAM: LEFT ANKLE COMPLETE - 3+ VIEW COMPARISON:  None. FINDINGS: Diffuse soft tissue swelling. No fracture or subluxation. Tiny Achilles left calcaneal spur. No focal osseous lesions. No radiopaque foreign bodies. IMPRESSION: Diffuse left ankle soft tissue swelling, with no fracture or subluxation. Electronically Signed   By: Delbert Phenix M.D.   On: 10/04/2019 19:49   DG Knee AP/LAT W/Sunrise Right  Result Date: 10/04/2019 CLINICAL DATA:  Status post fall. EXAM: RIGHT KNEE 3 VIEWS COMPARISON:  None. FINDINGS: No evidence of fracture, dislocation, or joint effusion. No evidence of arthropathy or other focal bone abnormality. Soft tissues are unremarkable. IMPRESSION: Negative. Electronically Signed   By: Aram Candela M.D.   On: 10/04/2019 19:43    ED Clinical Impression  1. Fall, initial encounter   2. Contusion of right knee, initial encounter   3. Sprain of deltoid ligament of left ankle, initial encounter      ED Assessment/Plan  Patient does not meet the [place] ankle rules due to tenderness over the medial lateral malleoli or the [place] knee rules due to the patellar tenderness although she has been ambulatory on both of her lower extremities.  Reviewed imaging independently.  Diffuse left ankle soft tissue swelling with no fracture or subluxation.  Normal right knee.  See radiology report for full details.  Patient with a contusion to the right knee and left ankle sprain.  She has anaphylaxis to Norco/Vicodin, and cannot take NSAIDs due to the Lovenox.  She is able  to tolerate tramadol but states it does not work for her.  We will have her ice her injuries for  20 minutes at a time for the next 72 hours and then may use heat.  will put her in a left ankle ASO and a right knee sleeve/Ace wrap.  Tylenol 1000 mg 3-4 times a day.  Follow-up with sports medicine if not better in 10 days to 2 weeks.  Refer to [facility], Drs. Paulla Fore or Raeford Razor.  Discussed imaging, MDM, treatment plan, and plan for follow-up with patient. Discussed sn/sx that should prompt return to the ED. patient agrees with plan.   No orders of the defined types were placed in this encounter.   *This clinic note was created using Dragon dictation software. Therefore, there may be occasional mistakes despite careful proofreading.   ?    Melynda Ripple, MD 10/04/19 2003

## 2019-10-04 NOTE — Discharge Instructions (Addendum)
Your x-rays were negative for fracture, dislocation, or fluid in either of your joints.  You have bruised your knee and sprained your ankle.  Wear the Ace wraps/knee sleeve as needed for comfort and the ASO for support in addition to comfort.  Take at 1000 mg of Tylenol 3-4 times a day as needed for pain.  Ice the areas for 20 minutes at a time for the first 72 hours, then you may apply heat.  Follow-up with [facility], Drs. Berline Chough or Jordan Likes if not better in 10 to 14 days for reevaluation.  You may need physical therapy.

## 2019-12-11 ENCOUNTER — Ambulatory Visit (HOSPITAL_COMMUNITY)
Admission: EM | Admit: 2019-12-11 | Discharge: 2019-12-11 | Disposition: A | Discharge status: Home / Self Care | Payer: Medicaid Other | Attending: Family Medicine | Admitting: Family Medicine

## 2019-12-11 ENCOUNTER — Other Ambulatory Visit: Payer: Self-pay

## 2019-12-11 ENCOUNTER — Encounter (HOSPITAL_COMMUNITY): Payer: Self-pay

## 2019-12-11 DIAGNOSIS — U071 COVID-19: Secondary | ICD-10-CM | POA: Insufficient documentation

## 2019-12-11 DIAGNOSIS — J069 Acute upper respiratory infection, unspecified: Secondary | ICD-10-CM

## 2019-12-11 DIAGNOSIS — Z20822 Contact with and (suspected) exposure to covid-19: Secondary | ICD-10-CM | POA: Diagnosis present

## 2019-12-11 NOTE — Discharge Instructions (Signed)
Go home to rest Drink plenty of fluids Take Tylenol for pain or fever You may take over-the-counter cough and cold medicines as needed You must quarantine at home until your test result is available You can check for your test result in MyChart  

## 2019-12-11 NOTE — ED Provider Notes (Signed)
[facility]    CSN: [id number] Arrival date & time: 12/11/19  1925      History   Chief Complaint Chief Complaint  Patient presents with  . Cough    HPI Hayley Webb is a 34 y.o. female.   HPI  Patient is here for an upper respiratory infection.  She has cough and shortness of breath for 3 days.  She states that she still has normal ability to taste and smell.  No headache.  No body ache.  No fever or chills.  She does have asthma.  She has tried her inhalers but it does not help.  This worries her because usually inhalers give her prompt improvement. Patient does have underlying lupus. We discussed that if this is not Covid, it is important that she get Covid vaccinated ASAP  Past Medical History:  Diagnosis Date  . Anxiety    doing good now  . Asthma   . Headache(784.0)   . HSV infection   . Hypertension   . Infection    UTI  . Lupus (HCC)    dx age 11  . Pulmonary embolism (HCC) 06/4/82011  . 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 Postpartum anemia 01/17/2019  . Normal postpartum course 01/17/2019  . Chronic hypertension complicating or reason for care during pregnancy, third trimester 01/12/2019  . Threatened preterm labor 12/25/2018  . Threatened preterm labor, third trimester 12/23/2018  . Preterm labor 12/23/2018  . Depressive disorder 12/23/2018  . Anxiety disorder 12/23/2018  . Sicca syndrome (HCC) 08/27/2018  . Chronic hypertension affecting pregnancy 07/25/2018  . Pregnant 07/01/2018  . Maternal obesity affecting pregnancy, antepartum 06/11/2018  . Allergy to shellfish 06/11/2018  . Chest pain 04/04/2017  . On continuous oral anticoagulation 04/04/2017  . 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 Nonintractable headache 04/04/2017  . Altered thought processes 04/04/2017  . Word finding difficulty 04/04/2017  . Antiphospholipid antibody syndrome (HCC)  02/10/2017  . Pulmonary embolus (HCC) 02/09/2017  . Pulmonary embolism (HCC) 12/26/2016  . Shortness of breath   . History of pulmonary embolus (PE) 07/24/2016  . Vaginal delivery--VE assist 10/25/2013  . 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 Obesity-BMI 47 05/09/2013  . Hirsutism 10/02/2012  . STD (female) 01/18/2012  . Lupus (HCC)   . Asthma   . HSV infection     Past Surgical History:  Procedure Laterality Date  . ADENOIDECTOMY    . TONSILLECTOMY AND ADENOIDECTOMY  1998  . WISDOM TOOTH EXTRACTION      OB History    Gravida  3   Para  2   Term  1   Preterm  1   AB  1   Living  2     SAB  1   TAB      Ectopic      Multiple  0   Live Births  2            Home Medications    Prior to Admission medications   Medication Sig Start Date End Date Taking? Authorizing Provider  albuterol (PROVENTIL HFA;VENTOLIN HFA) 108 (90 Base) MCG/ACT inhaler Inhale 2 puffs into the lungs every 6 (six) hours as needed for wheezing or shortness of breath.    [provider]  butalbital-acetaminophen-caffeine (FIORICET) 50-325-40 MG tablet Take 2 tablets by mouth every 6 (six)  hours as needed for headache. 01/17/19   Dale Virgil, FNP  cholecalciferol (VITAMIN D) 1000 units tablet Take 1,000 Units by mouth daily.    [provider]  cyclobenzaprine (FLEXERIL) 10 MG tablet Take 10 mg by mouth 3 (three) times daily as needed for muscle spasms.    [provider]  enoxaparin (LOVENOX) 40 MG/0.4ML injection Inject 1.5 mLs (150 mg total) into the skin every 12 (twelve) hours. 07/08/18   Serena Croissant, MD  hydroxychloroquine (PLAQUENIL) 200 MG tablet Take 100 mg by mouth 2 (two) times daily.    [provider]  labetalol (NORMODYNE) 200 MG tablet Take 2 tablets (400 mg total) by mouth 2 (two) times daily. 01/17/19   Dale [place], FNP  methylPREDNISolone (MEDROL PO) Take by mouth.     [provider]  Multiple Vitamin (MULTIVITAMIN) capsule Take 1 capsule by mouth daily.    [provider]  ferrous sulfate 325 (65 FE) MG tablet Take 1 tablet (325 mg total) by mouth 2 (two) times daily with a meal. 01/17/19 12/11/19  Dale [place], FNP    Family History Family History  Problem Relation Age of Onset  . Diabetes Maternal Aunt   . Cancer Maternal Grandmother 72       COLON CA  . Diabetes Maternal Grandmother   . Hypertension Maternal Grandfather   . Cancer Maternal Grandfather        prostate  . Asthma Mother   . Asthma Brother   . Cancer Paternal Grandmother        breast  . Lupus Paternal Grandmother   . Lupus Paternal Aunt   . Stroke Paternal Aunt   . Anesthesia problems Neg Hx   . Hypotension Neg Hx   . Malignant hyperthermia Neg Hx   . Pseudochol deficiency Neg Hx     Social History Social History   Tobacco Use  . Smoking status: Never Smoker  . Smokeless tobacco: Never Used  Substance Use Topics  . Alcohol use: No  . Drug use: No     Allergies   Bactrim, Hydrocodone-acetaminophen, Nifedipine, Pineapple, Prednisone, Shellfish allergy, Sulfamethoxazole-trimethoprim, Nsaids, Other, and Vicodin [hydrocodone-acetaminophen]   Review of Systems Review of Systems  Respiratory: Positive for cough and shortness of breath.      Physical Exam Triage Vital Signs ED Triage Vitals  Enc Vitals Group     BP 12/11/19 1951 (!) 124/98     Pulse Rate 12/11/19 1951 82     Resp 12/11/19 1951 18     Temp 12/11/19 1951 98.5 F (36.9 C)     Temp Source 12/11/19 1951 Oral     SpO2 12/11/19 1951 100 %     Weight 12/11/19 1952 298 lb (135.2 kg)     Height 12/11/19 1952 5' 8.5" (1.74 m)     Head Circumference --      Peak Flow --      Pain Score 12/11/19 1951 4     Pain Loc --      Pain Edu? --      Excl. in GC? --    No data found.  Updated Vital Signs BP (!) 124/98   Pulse 82   Temp 98.5 F (36.9 C) (Oral)   Resp 18   Ht 5'  8.5" (1.74 m)   Wt 135.2 kg   SpO2 100%   BMI 44.65 kg/m      Physical Exam Constitutional:      General: She is not in acute distress.  Appearance: She is well-developed. She is obese.  HENT:     Head: Normocephalic and atraumatic.     Mouth/Throat:     Comments: Mask is in place Eyes:     Conjunctiva/sclera: Conjunctivae normal.     Pupils: Pupils are equal, round, and reactive to light.  Cardiovascular:     Rate and Rhythm: Normal rate.  Pulmonary:     Effort: Pulmonary effort is normal. No respiratory distress.     Breath sounds: Normal breath sounds.     Comments: Lungs are clear Musculoskeletal:        General: Normal range of motion.     Cervical back: Normal range of motion.  Skin:    General: Skin is warm and dry.  Neurological:     Mental Status: She is alert.  Psychiatric:        Mood and Affect: Mood normal.        Behavior: Behavior normal.      UC Treatments / Results  Labs (all labs ordered are listed, but only abnormal results are displayed) Labs Reviewed  SARS CORONAVIRUS 2 (TAT 6-24 HRS)    EKG   Radiology No results found.  Procedures Procedures (including critical care time)  Medications Ordered in UC Medications - No data to display  Initial Impression / Assessment and Plan / UC Course  I have reviewed the triage vital signs and the nursing notes.  Pertinent labs & imaging results that were available during my care of the patient were reviewed by me and considered in my medical decision making (see chart for details).     Reviewed importance of quarantine pending test results Final Clinical Impressions(s) / UC Diagnoses   Final diagnoses:  Viral URI with cough  Suspected COVID-19 virus infection     Discharge Instructions     Go home to rest Drink plenty of fluids Take Tylenol for pain or fever You may take over-the-counter cough and cold medicines as needed You must quarantine at home until your test result is  available You can check for your test result in MyChart    ED Prescriptions    None     PDMP not reviewed this encounter.   Raylene Everts, MD 12/11/19 [phone]

## 2019-12-11 NOTE — ED Triage Notes (Signed)
Pt c/o non productive cough and SOBx3 days. Pt has non labored breathing. Skin color WNL. Lungs are clear.

## 2019-12-12 ENCOUNTER — Encounter (HOSPITAL_COMMUNITY): Payer: Self-pay | Admitting: Emergency Medicine

## 2019-12-12 ENCOUNTER — Emergency Department (HOSPITAL_COMMUNITY): Payer: Medicaid Other

## 2019-12-12 ENCOUNTER — Telehealth (HOSPITAL_COMMUNITY): Payer: Self-pay

## 2019-12-12 ENCOUNTER — Emergency Department (HOSPITAL_COMMUNITY)
Admission: EM | Admit: 2019-12-12 | Discharge: 2019-12-12 | Disposition: A | Discharge status: Home / Self Care | Payer: Medicaid Other | Attending: Emergency Medicine | Admitting: Emergency Medicine

## 2019-12-12 DIAGNOSIS — R0602 Shortness of breath: Secondary | ICD-10-CM | POA: Diagnosis not present

## 2019-12-12 DIAGNOSIS — Z5321 Procedure and treatment not carried out due to patient leaving prior to being seen by health care provider: Secondary | ICD-10-CM | POA: Diagnosis not present

## 2019-12-12 LAB — I-STAT BETA HCG BLOOD, ED (MC, WL, AP ONLY): I-stat hCG, quantitative: 5.7 m[IU]/mL — ABNORMAL HIGH (ref <5)

## 2019-12-12 LAB — CBC
HCT: 40.7 % (ref 36.0–46.0)
Hemoglobin: 13 g/dL (ref 12.0–15.0)
MCH: 27.8 pg (ref 26.0–34.0)
MCHC: 31.9 g/dL (ref 30.0–36.0)
MCV: 87.2 fL (ref 80.0–100.0)
Platelets: 266 10*3/uL (ref 150–400)
RBC: 4.67 MIL/uL (ref 3.87–5.11)
RDW: 13.8 % (ref 11.5–15.5)
WBC: 3.6 10*3/uL — ABNORMAL LOW (ref 4.0–10.5)
nRBC: 0 % (ref 0.0–0.2)

## 2019-12-12 LAB — COMPREHENSIVE METABOLIC PANEL
ALT: 28 U/L (ref 0–44)
AST: 24 U/L (ref 15–41)
Albumin: 3.8 g/dL (ref 3.5–5.0)
Alkaline Phosphatase: 84 U/L (ref 38–126)
Anion gap: 10 (ref 5–15)
BUN: 7 mg/dL (ref 6–20)
CO2: 24 mmol/L (ref 22–32)
Calcium: 9.1 mg/dL (ref 8.9–10.3)
Chloride: 105 mmol/L (ref 98–111)
Creatinine, Ser: 0.89 mg/dL (ref 0.44–1.00)
GFR calc Af Amer: >60 mL/min (ref >60)
GFR calc non Af Amer: >60 mL/min (ref >60)
Glucose, Bld: 87 mg/dL (ref 70–99)
Potassium: 3.5 mmol/L (ref 3.5–5.1)
Sodium: 139 mmol/L (ref 135–145)
Total Bilirubin: 0.7 mg/dL (ref 0.3–1.2)
Total Protein: 7.3 g/dL (ref 6.5–8.1)

## 2019-12-12 LAB — SARS CORONAVIRUS 2 (TAT 6-24 HRS): SARS Coronavirus 2: POSITIVE — AB

## 2019-12-12 NOTE — ED Notes (Signed)
Pt now complaining of dizziness and increased SOB, triage RN [facility] notified.

## 2019-12-12 NOTE — ED Triage Notes (Signed)
Patient in POV, reports SOB/CP X4 days. Patient was seen at [facility] yesterday for same and diagnoses with COVID. Patient has history of PE and takes Lovenox twice per day. Patient appears SOB in triage, worse with exertion.

## 2019-12-13 ENCOUNTER — Telehealth: Payer: Self-pay | Admitting: Nurse Practitioner

## 2019-12-13 ENCOUNTER — Other Ambulatory Visit: Payer: Self-pay | Admitting: Physician Assistant

## 2019-12-13 ENCOUNTER — Telehealth: Payer: Self-pay | Admitting: Physician Assistant

## 2019-12-13 DIAGNOSIS — U071 COVID-19: Secondary | ICD-10-CM

## 2019-12-13 MED ORDER — SODIUM CHLORIDE 0.9 % IV SOLN
Freq: Once | INTRAVENOUS | Status: AC
  Filled 2019-12-13: qty 700

## 2019-12-13 NOTE — Telephone Encounter (Signed)
Called to discuss with patient about Covid symptoms and the use of bamlanivimab/etesevimab or casirivimab/imdevimab, a monoclonal antibody infusion for those with mild to moderate Covid symptoms and at a high risk of hospitalization.  Pt is qualified for this infusion at the [facility] due to BMI>35   Message left to call back  Carmine Carrozza PA-C  [facility]    

## 2019-12-13 NOTE — Progress Notes (Signed)
  I connected by phone with Hayley Webb on 12/13/2019 at 10:19 AM to discuss the potential use of an new treatment for mild to moderate COVID-19 viral infection in non-hospitalized patients.  This patient is a 34 y.o. female that meets the FDA criteria for Emergency Use Authorization of bamlanivimab/etesevimab or casirivimab/imdevimab.  Has a (+) direct SARS-CoV-2 viral test result  Has mild or moderate COVID-19   Is ? 34 years of age and weighs ? 40 kg  Is NOT hospitalized due to COVID-19  Is NOT requiring oxygen therapy or requiring an increase in baseline oxygen flow rate due to COVID-19  Is within 10 days of symptom onset  Has at least one of the high risk factor(s) for progression to severe COVID-19 and/or hospitalization as defined in EUA.  Specific high risk criteria : BMI >/= 35   I have spoken and communicated the following to the patient or parent/caregiver:  1. FDA has authorized the emergency use of bamlanivimab/etesevimab and casirivimab\imdevimab for the treatment of mild to moderate COVID-19 in adults and pediatric patients with positive results of direct SARS-CoV-2 viral testing who are 7 years of age and older weighing at least 40 kg, and who are at high risk for progressing to severe COVID-19 and/or hospitalization.  2. The significant known and potential risks and benefits of bamlanivimab/etesevimab and casirivimab\imdevimab, and the extent to which such potential risks and benefits are unknown.  3. Information on available alternative treatments and the risks and benefits of those alternatives, including clinical trials.  4. Patients treated with bamlanivimab/etesevimab and casirivimab\imdevimab should continue to self-isolate and use infection control measures (e.g., wear mask, isolate, social distance, avoid sharing personal items, clean and disinfect "high touch" surfaces, and frequent handwashing) according to CDC guidelines.   5. The patient or  parent/caregiver has the option to accept or refuse bamlanivimab/etesevimab or casirivimab\imdevimab .  After reviewing this information with the patient, The patient agreed to proceed with receiving the bamlanivimab/etesevimab infusion and will be provided a copy of the Fact sheet prior to receiving the infusion..  Sx onset 5/15. Set up for infusion tomorrow @ 9:30am.   Cline Crock 12/13/2019 10:19 AM

## 2019-12-13 NOTE — Telephone Encounter (Signed)
Called to discuss with Hayley Webb about Covid symptoms and the use of  bamlanivimab/etesevimab or casirivimab/imdevimab, a combination monoclonal antibody infusion for those with mild to moderate Covid symptoms and at a high risk of hospitalization.     Pt is qualified for this infusion at the [facility] center due to co-morbid conditions and/or a member of an at-risk group (BMI >35, hypertension, and lupus).   Unable to reach. Voicemail left.      Patient Active Problem List   Diagnosis Date Noted  . Postpartum anemia 01/17/2019  . Normal postpartum course 01/17/2019  . Chronic hypertension complicating or reason for care during pregnancy, third trimester 01/12/2019  . Threatened preterm labor 12/25/2018  . Threatened preterm labor, third trimester 12/23/2018  . Preterm labor 12/23/2018  . Depressive disorder 12/23/2018  . Anxiety disorder 12/23/2018  . Sicca syndrome (HCC) 08/27/2018  . Chronic hypertension affecting pregnancy 07/25/2018  . Pregnant 07/01/2018  . Maternal obesity affecting pregnancy, antepartum 06/11/2018  . Allergy to shellfish 06/11/2018  . Chest pain 04/04/2017  . On continuous oral anticoagulation 04/04/2017  . 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 Nonintractable headache 04/04/2017  . Altered thought processes 04/04/2017  . Word finding difficulty 04/04/2017  . Antiphospholipid antibody syndrome (HCC) 02/10/2017  . Pulmonary embolus (HCC) 02/09/2017  . Pulmonary embolism (HCC) 12/26/2016  . Shortness of breath   . History of pulmonary embolus (PE) 07/24/2016  . Vaginal delivery--VE assist 10/25/2013  . 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 Obesity-BMI 47 05/09/2013  . Hirsutism 10/02/2012  . STD (female) 01/18/2012  . Lupus (HCC)   . Asthma   . HSV infection     [name], AGPCNP-BC Pager: [phone] Amion: N. Cousar

## 2019-12-14 ENCOUNTER — Ambulatory Visit (HOSPITAL_COMMUNITY)
Admission: RE | Admit: 2019-12-14 | Discharge: 2019-12-14 | Disposition: A | Discharge status: Home / Self Care | Payer: Medicaid Other | Source: Ambulatory Visit | Attending: Pulmonary Disease | Admitting: Pulmonary Disease

## 2019-12-14 DIAGNOSIS — U071 COVID-19: Secondary | ICD-10-CM | POA: Insufficient documentation

## 2019-12-14 MED ORDER — SODIUM CHLORIDE 0.9 % IV SOLN: INTRAVENOUS | Status: DC | PRN

## 2019-12-14 MED ORDER — DIPHENHYDRAMINE HCL 50 MG/ML IJ SOLN: 50.0000 mg | Freq: Once | INTRAMUSCULAR | Status: DC | PRN

## 2019-12-14 MED ORDER — FAMOTIDINE IN NACL 20-0.9 MG/50ML-% IV SOLN: 20.0000 mg | Freq: Once | INTRAVENOUS | Status: DC | PRN

## 2019-12-14 MED ORDER — ALBUTEROL SULFATE HFA 108 (90 BASE) MCG/ACT IN AERS: 2.0000 | INHALATION_SPRAY | Freq: Once | RESPIRATORY_TRACT | Status: DC | PRN

## 2019-12-14 MED ORDER — EPINEPHRINE 0.3 MG/0.3ML IJ SOAJ: 0.3000 mg | Freq: Once | INTRAMUSCULAR | Status: DC | PRN

## 2019-12-14 MED ORDER — METHYLPREDNISOLONE SODIUM SUCC 125 MG IJ SOLR: 125.0000 mg | Freq: Once | INTRAMUSCULAR | Status: DC | PRN

## 2019-12-14 NOTE — Discharge Instructions (Signed)

## 2019-12-14 NOTE — Progress Notes (Signed)
  Diagnosis: COVID-19  Physician:Dr Wright  Procedure: [facility] Med: bamlanivimab\etesevimab infusion - Provided patient with bamlanimivab\etesevimab fact sheet for patients, parents and caregivers prior to infusion.  Complications: No immediate complications noted.  Discharge: Discharged home   Hayley Webb 12/14/2019   

## 2020-01-05 ENCOUNTER — Ambulatory Visit: Payer: Medicaid Other | Admitting: Family Medicine

## 2020-01-12 ENCOUNTER — Other Ambulatory Visit: Payer: Self-pay

## 2020-01-12 ENCOUNTER — Ambulatory Visit
Admission: RE | Admit: 2020-01-12 | Discharge: 2020-01-12 | Disposition: A | Discharge status: Home / Self Care | Payer: Medicaid Other | Source: Ambulatory Visit | Attending: Sports Medicine | Admitting: Sports Medicine

## 2020-01-12 ENCOUNTER — Ambulatory Visit: Payer: Medicaid Other | Admitting: Sports Medicine

## 2020-01-12 VITALS — BP 137/97 | Ht 68.0 in | Wt 292.0 lb

## 2020-01-12 DIAGNOSIS — M25562 Pain in left knee: Secondary | ICD-10-CM

## 2020-01-12 DIAGNOSIS — M25569 Pain in unspecified knee: Secondary | ICD-10-CM

## 2020-01-12 DIAGNOSIS — M25561 Pain in right knee: Secondary | ICD-10-CM

## 2020-01-12 DIAGNOSIS — M25462 Effusion, left knee: Secondary | ICD-10-CM | POA: Diagnosis not present

## 2020-01-12 HISTORY — DX: Pain in unspecified knee: M25.569

## 2020-01-12 NOTE — Progress Notes (Signed)
   Hayley Webb is a 34 y.o. female who presents to [facility] today for the following:  Knee pain Patient presenting as new patient for bilateral knee pain but left worse than right.  States that in March she slipped on soda that was on the floor and fell directly onto her knees bilaterally.  Since then her knees have been hurting, left worse than right.  States that they started swelling at the time and left knee continues to swell.  Initially had bruising under her left knee but no longer.  States she cannot stand for long periods of time, when she stands for over 2 hours she cannot walk afterwards.  Cannot go up or down the stairs so has to sleep downstairs now.  Denies any locking but does report instability.  Does have a history of lupus so has had knee pain in the past but this feels nothing like that.  Has tried a Medrol Dosepak, Flexeril, Percocet none of which are helping.  Has tried ice and heat as well.  Uses Voltaren gel which helps for about 30 to 40 minutes but afterwards has significant pain again.  PMH reviewed. Sicca syndrome, antiphospholipid antibody syndrome, lupus, obesity  ROS as above. Medications reviewed.  Exam:  BP (!) 137/97   Ht 5\' 8"  (1.727 m)   Wt 292 lb (132.5 kg)   BMI 44.40 kg/m  Gen: Well NAD MSK: Knee: - Inspection: no gross deformity. No swelling/effusion, erythema or bruising. Skin intact - Palpation: TTP of lateral joint line bilaterally. TTP of infrapatella tendon in Left  - ROM: full active ROM with flexion and extension in knee and hip - Strength: 5/5 strength - Neuro/vasc: NV intact - Special Tests: - LIGAMENTS: negative anterior and posterior drawer, negative Lachman's, no MCL or LCL laxity  -- MENISCUS: negative McMurray's, Positive Thessaly on left  -- PF JOINT: nml patellar mobility bilaterally.  negative patellar grind, negative patellar apprehension  Hips: normal ROM, negative FABER and FADIR bilaterally   Assessment and Plan: 1) Knee  pain Patient with acute onset bilateral knee pain following a fall.  Left is worse than right.  Physical exam of the left knee shows positive Thessaly's sign.  Given acute injury and positive Thessaly sign there is some concern for some meniscal injury.  Will get MRI to rule this out.  No previous x-ray imaging so will obtain this as well.  Can continue NSAIDs and Voltaren gel as needed.  For her right knee physical exam was not as revealing.  Likely strain from fall and also likely putting more weight on that leg given left knee injury.  Advised brace for instability and to help with some pain.  Can continue NSAIDs Voltaren for right knee as well.  Follow-up in 4 weeks.   , PGY-3 [facility] Resident 01/12/2020 11:30 AM  Patient seen and evaluated with the resident.  I agree with the above plan of care.  Patient's left knee injury is almost 55 months old.  X-rays are unremarkable other than some mild DJD.  We will get an MRI specifically to rule out a meniscal tear.  Phone follow-up with those results when available.  We will delineate further treatment based on those findings.  In the meantime, proceed with treatment as above.

## 2020-01-12 NOTE — Assessment & Plan Note (Signed)
Patient with acute onset bilateral knee pain following a fall.  Left is worse than right.  Physical exam of the left knee shows positive Thessaly's sign.  Given acute injury and positive Thessaly sign there is some concern for some meniscal injury.  Will get MRI to rule this out.  No previous x-ray imaging so will obtain this as well.  Can continue NSAIDs and Voltaren gel as needed.  For her right knee physical exam was not as revealing.  Likely strain from fall and also likely putting more weight on that leg given left knee injury.  Advised brace for instability and to help with some pain.  Can continue NSAIDs Voltaren for right knee as well.  Follow-up in 4 weeks.

## 2020-02-12 ENCOUNTER — Other Ambulatory Visit: Payer: Self-pay

## 2020-02-12 ENCOUNTER — Ambulatory Visit
Admission: RE | Admit: 2020-02-12 | Discharge: 2020-02-12 | Disposition: A | Discharge status: Home / Self Care | Payer: Self-pay | Source: Ambulatory Visit | Attending: Sports Medicine | Admitting: Sports Medicine

## 2020-02-12 DIAGNOSIS — S8992XA Unspecified injury of left lower leg, initial encounter: Secondary | ICD-10-CM | POA: Diagnosis not present

## 2020-02-12 DIAGNOSIS — M25562 Pain in left knee: Secondary | ICD-10-CM

## 2020-02-12 DIAGNOSIS — M25462 Effusion, left knee: Secondary | ICD-10-CM | POA: Diagnosis not present

## 2020-02-12 DIAGNOSIS — M1712 Unilateral primary osteoarthritis, left knee: Secondary | ICD-10-CM | POA: Diagnosis not present

## 2020-02-17 ENCOUNTER — Ambulatory Visit: Payer: Medicaid Other | Admitting: Sports Medicine

## 2020-02-18 ENCOUNTER — Telehealth: Payer: Self-pay | Admitting: Sports Medicine

## 2020-02-18 NOTE — Telephone Encounter (Signed)
  Patient notified via telephone earlier this week of MRI results of the left knee.  No meniscal tear seen.  He does have moderate degenerative changes, primarily along the lateral compartment.  I recommended that she return to the office to discuss possible cortisone injection.  Of note, review of her chart shows that she has an allergy to prednisone so we may need to consider viscosupplementation instead.  She will follow-up at her earliest convenience to discuss this further.

## 2020-02-24 ENCOUNTER — Other Ambulatory Visit: Payer: Self-pay

## 2020-02-24 ENCOUNTER — Ambulatory Visit: Payer: Medicaid Other | Admitting: Sports Medicine

## 2020-02-24 VITALS — BP 132/92 | Ht 68.5 in | Wt 295.0 lb

## 2020-02-24 DIAGNOSIS — D6861 Antiphospholipid syndrome: Secondary | ICD-10-CM | POA: Diagnosis not present

## 2020-02-24 DIAGNOSIS — M170 Bilateral primary osteoarthritis of knee: Secondary | ICD-10-CM | POA: Diagnosis not present

## 2020-02-24 MED ORDER — METHYLPREDNISOLONE ACETATE 40 MG/ML IJ SUSP
40.0000 mg | Freq: Once | INTRAMUSCULAR | Status: AC
  Administered 2020-02-24: 40 mg via INTRA_ARTICULAR

## 2020-02-25 ENCOUNTER — Encounter: Payer: Self-pay | Admitting: Sports Medicine

## 2020-02-25 NOTE — Progress Notes (Signed)
   Subjective:    Patient ID: Hayley Webb, female    DOB: May 16, 1986, 34 y.o.   MRN: [id number]  HPI   Patient comes in today to discuss MRI findings as well as possible bilateral knee injections.  Recent MRI of her left knee showed moderate degenerative changes primarily along the lateral compartment.  No evidence of meniscal injury.  Nothing acute.  She is having diffuse pain in both knees.  Worse when standing or walking for long periods of time.  She would like to consider cortisone injections today for both knees.  Social history reviewed.  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    Review of Systems As above    Objective:   Physical Exam  Obese.  No acute distress.  Examination of both knees is limited somewhat by body habitus.  Range of motion is 0 to 120 degrees.  Trace effusion bilaterally.  Negative patellar grind.  No patellofemoral crepitus.  Good joint stability.  Neurovascularly intact distally.      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  She tells me that she is able to tolerate other types of steroids and she agrees to proceed with cortisone injections today.  This was accomplished atraumatically under sterile technique utilizing an anterior medial approach after risks and benefits were explained.  I have also recommended a compression sleeve when active and she will start isometric quad exercises daily.  I explained to her that she has rather advanced arthritis in her knees for her young age.  She understands the importance of weight loss and the overall health of her knees.  If symptoms persist despite today's cortisone injections, we could consider viscosupplementation or physical therapy.  Follow-up in 3 to 4 weeks.

## 2020-03-04 ENCOUNTER — Ambulatory Visit (HOSPITAL_COMMUNITY)
Admission: EM | Admit: 2020-03-04 | Discharge: 2020-03-04 | Discharge status: Absent without leave | Payer: Medicaid Other

## 2020-03-04 ENCOUNTER — Other Ambulatory Visit: Payer: Self-pay

## 2020-03-04 ENCOUNTER — Ambulatory Visit
Admission: EM | Admit: 2020-03-04 | Discharge: 2020-03-04 | Disposition: A | Discharge status: Home / Self Care | Payer: Medicaid Other | Attending: Physician Assistant | Admitting: Physician Assistant

## 2020-03-04 DIAGNOSIS — J209 Acute bronchitis, unspecified: Secondary | ICD-10-CM

## 2020-03-04 MED ORDER — DOXYCYCLINE HYCLATE 100 MG PO CAPS
100.0000 mg | ORAL_CAPSULE | Freq: Two times a day (BID) | ORAL | 0 refills | Status: DC
Start: 2020-03-04 — End: 2020-05-03

## 2020-03-04 MED ORDER — IPRATROPIUM-ALBUTEROL 0.5-2.5 (3) MG/3ML IN SOLN: 3.0000 mL | Freq: Four times a day (QID) | RESPIRATORY_TRACT | 0 refills | Status: DC | PRN

## 2020-03-04 MED ORDER — METHYLPREDNISOLONE 4 MG PO TABS: 4.0000 mg | ORAL_TABLET | Freq: Every day | ORAL | 0 refills | Status: DC

## 2020-03-04 MED ORDER — FLUCONAZOLE 150 MG PO TABS
150.0000 mg | ORAL_TABLET | Freq: Every day | ORAL | 0 refills | Status: DC
Start: 2020-03-04 — End: 2020-05-03

## 2020-03-04 MED ORDER — ALBUTEROL SULFATE (2.5 MG/3ML) 0.083% IN NEBU
2.5000 mg | INHALATION_SOLUTION | Freq: Four times a day (QID) | RESPIRATORY_TRACT | 0 refills | Status: DC | PRN
Start: 2020-03-04 — End: 2023-01-11

## 2020-03-04 MED ORDER — ALBUTEROL SULFATE HFA 108 (90 BASE) MCG/ACT IN AERS
4.0000 | INHALATION_SPRAY | Freq: Once | RESPIRATORY_TRACT | Status: AC
  Administered 2020-03-04: 4 via RESPIRATORY_TRACT

## 2020-03-04 MED ORDER — ALBUTEROL SULFATE HFA 108 (90 BASE) MCG/ACT IN AERS: 2.0000 | INHALATION_SPRAY | Freq: Four times a day (QID) | RESPIRATORY_TRACT | 0 refills | Status: DC | PRN

## 2020-03-04 MED ORDER — DEXAMETHASONE SODIUM PHOSPHATE 10 MG/ML IJ SOLN
10.0000 mg | Freq: Once | INTRAMUSCULAR | Status: AC
  Administered 2020-03-04: 10 mg via INTRAMUSCULAR

## 2020-03-04 NOTE — ED Provider Notes (Signed)
[facility]    CSN: [id number] Arrival date & time: 03/04/20  1241      History   Chief Complaint Chief Complaint  Patient presents with  . Cough    HPI Hayley Webb is a 34 y.o. female.   34 year old female comes in for 1 week history of URI symptoms. Cough, nasal congestion. For the past 2-3 days, has had dyspnea on exertion with wheezing. Also waking up coughing and wheezing. Albuterol without relief. Denies fever, chills, body aches. COVID + 12/11/2019, symptoms resolved prior to current symptom onset.   On Lovenox for history of PE due to lupus.      Past Medical History:  Diagnosis Date  . Anxiety    doing good now  . Asthma   . Headache(784.0)   . HSV infection   . Hypertension   . Infection    UTI  . Lupus (HCC)    dx age 83  . Pulmonary embolism (HCC) 06/4/82011  . 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 Knee pain 01/12/2020  . Postpartum anemia 01/17/2019  . Normal postpartum course 01/17/2019  . Chronic hypertension complicating or reason for care during pregnancy, third trimester 01/12/2019  . Threatened preterm labor 12/25/2018  . Threatened preterm labor, third trimester 12/23/2018  . Preterm labor 12/23/2018  . Depressive disorder 12/23/2018  . Anxiety disorder 12/23/2018  . Sicca syndrome (HCC) 08/27/2018  . Chronic hypertension affecting pregnancy 07/25/2018  . Pregnant 07/01/2018  . Maternal obesity affecting pregnancy, antepartum 06/11/2018  . Allergy to shellfish 06/11/2018  . Chest pain 04/04/2017  . On continuous oral anticoagulation 04/04/2017  . 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 Nonintractable headache 04/04/2017  . Altered thought processes 04/04/2017  . Word finding difficulty 04/04/2017  . Antiphospholipid antibody syndrome (HCC) 02/10/2017  . Pulmonary embolus (HCC) 02/09/2017  . Pulmonary embolism (HCC) 12/26/2016  .  Shortness of breath   . History of pulmonary embolus (PE) 07/24/2016  . Vaginal delivery--VE assist 10/25/2013  . 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 Obesity-BMI 47 05/09/2013  . Hirsutism 10/02/2012  . STD (female) 01/18/2012  . Lupus (HCC)   . Asthma   . HSV infection     Past Surgical History:  Procedure Laterality Date  . ADENOIDECTOMY    . TONSILLECTOMY AND ADENOIDECTOMY  1998  . WISDOM TOOTH EXTRACTION      OB History    Gravida  3   Para  2   Term  1   Preterm  1   AB  1   Living  2     SAB  1   TAB      Ectopic      Multiple  0   Live Births  2            Home Medications    Prior to Admission medications   Medication Sig Start Date End Date Taking? Authorizing Provider  albuterol (PROVENTIL) (2.5 MG/3ML) 0.083% nebulizer solution Take 3 mLs (2.5 mg total) by nebulization every 6 (six) hours as needed for wheezing or shortness of breath. 03/04/20   Cathie Hoops, Bobbye Petti V, PA-C  albuterol (VENTOLIN HFA) 108 (90 Base) MCG/ACT inhaler Inhale 2 puffs into the lungs every 6 (six) hours as needed for wheezing or shortness of breath. 03/04/20   Belinda Fisher, PA-C  butalbital-acetaminophen-caffeine (  FIORICET) 50-325-40 MG tablet Take 2 tablets by mouth every 6 (six) hours as needed for headache. 01/17/19   Dale Tyler, FNP  cholecalciferol (VITAMIN D) 1000 units tablet Take 1,000 Units by mouth daily.    [provider]  cyclobenzaprine (FLEXERIL) 10 MG tablet Take 10 mg by mouth 3 (three) times daily as needed for muscle spasms.    [provider]  doxycycline (VIBRAMYCIN) 100 MG capsule Take 1 capsule (100 mg total) by mouth 2 (two) times daily. 03/04/20   Cathie Hoops, Czar Ysaguirre V, PA-C  enoxaparin (LOVENOX) 40 MG/0.4ML injection Inject 1.5 mLs (150 mg total) into the skin every 12 (twelve) hours. 07/08/18   Serena Croissant, MD  fluconazole (DIFLUCAN) 150 MG tablet Take 1 tablet (150 mg total) by  mouth daily. Take second dose 72 hours later if symptoms still persists. 03/04/20   Cathie Hoops, Kashawn Manzano V, PA-C  hydroxychloroquine (PLAQUENIL) 200 MG tablet Take 100 mg by mouth 2 (two) times daily.    [provider]  ipratropium-albuterol (DUONEB) 0.5-2.5 (3) MG/3ML SOLN Take 3 mLs by nebulization every 6 (six) hours as needed. 03/04/20   Cathie Hoops, Karoline Fleer V, PA-C  labetalol (NORMODYNE) 200 MG tablet Take 2 tablets (400 mg total) by mouth 2 (two) times daily. 01/17/19   Dale Tuolumne, FNP  methylPREDNISolone (MEDROL PO) Take by mouth.    [provider]  methylPREDNISolone (MEDROL) 4 MG tablet Take 1 tablet (4 mg total) by mouth daily. 03/04/20   Cathie Hoops, Nikolette Reindl V, PA-C  Multiple Vitamin (MULTIVITAMIN) capsule Take 1 capsule by mouth daily.    [provider]  ferrous sulfate 325 (65 FE) MG tablet Take 1 tablet (325 mg total) by mouth 2 (two) times daily with a meal. 01/17/19 12/11/19  Dale Tornado, FNP    Family History Family History  Problem Relation Age of Onset  . Diabetes Maternal Aunt   . Cancer Maternal Grandmother 72       COLON CA  . Diabetes Maternal Grandmother   . Hypertension Maternal Grandfather   . Cancer Maternal Grandfather        prostate  . Asthma Mother   . Asthma Brother   . Cancer Paternal Grandmother        breast  . Lupus Paternal Grandmother   . Lupus Paternal Aunt   . Stroke Paternal Aunt   . Anesthesia problems Neg Hx   . Hypotension Neg Hx   . Malignant hyperthermia Neg Hx   . Pseudochol deficiency Neg Hx     Social History Social History   Tobacco Use  . Smoking status: Never Smoker  . Smokeless tobacco: Never Used  Vaping Use  . Vaping Use: Never used  Substance Use Topics  . Alcohol use: No  . Drug use: No     Allergies   Bactrim, Hydrocodone-acetaminophen, Nifedipine, Pineapple, Prednisone, Shellfish allergy, Sulfamethoxazole-trimethoprim, Nsaids, Other, and Vicodin [hydrocodone-acetaminophen]   Review of Systems Review of Systems    Reason unable to perform ROS: See HPI as above.     Physical Exam Triage Vital Signs ED Triage Vitals [03/04/20 1418]  Enc Vitals Group     BP (!) 137/108     Pulse Rate 87     Resp 18     Temp 98.1 F (36.7 C)     Temp Source Oral     SpO2 97 %     Weight      Height      Head Circumference      Peak Flow  Pain Score 0     Pain Loc      Pain Edu?      Excl. in GC?    No data found.  Updated Vital Signs BP (!) 137/108 (BP Location: Left Arm)   Pulse 87   Temp 98.1 F (36.7 C) (Oral)   Resp 18   LMP 02/11/2020   SpO2 97%   Breastfeeding No   Physical Exam Constitutional:      General: She is not in acute distress.    Appearance: Normal appearance. She is well-developed. She is not toxic-appearing or diaphoretic.  HENT:     Head: Normocephalic and atraumatic.  Eyes:     Conjunctiva/sclera: Conjunctivae normal.     Pupils: Pupils are equal, round, and reactive to light.  Cardiovascular:     Rate and Rhythm: Normal rate and regular rhythm.     Heart sounds: No murmur heard.  No friction rub. No gallop.   Pulmonary:     Effort: Pulmonary effort is normal. No respiratory distress.     Comments: Speaking in full sentences without difficulty. Diffuse inspiratory and expiratory wheezing throughout. Adequate are movement  Albuterol 4 puffs x 1: improved air movement. Still with inspiratory and expiratory wheezing, more significant to lower lobes. Diffuse rhonchi.   Albuterol 4 puffs x 2:  Musculoskeletal:     Cervical back: Normal range of motion and neck supple.  Skin:    General: Skin is warm and dry.  Neurological:     Mental Status: She is alert and oriented to person, place, and time.      UC Treatments / Results  Labs (all labs ordered are listed, but only abnormal results are displayed) Labs Reviewed - No data to display  EKG   Radiology No results found.  Procedures Procedures (including critical care time)  Medications Ordered in  UC Medications  albuterol (VENTOLIN HFA) 108 (90 Base) MCG/ACT inhaler 4 puff (4 puffs Inhalation Given 03/04/20 1441)  dexamethasone (DECADRON) injection 10 mg (10 mg Intramuscular Given 03/04/20 1441)    Initial Impression / Assessment and Plan / UC Course  I have reviewed the triage vital signs and the nursing notes.  Pertinent labs & imaging results that were available during my care of the patient were reviewed by me and considered in my medical decision making (see chart for details).    Will cover for bronchitis with doxycycline and corticosteroid.  Patient states due to lupus with long-term prednisone use, prednisone is no longer effective.  She usually takes Medrol now.  Provide Decadron injection in office today.  Medrol course as directed.  DuoNeb, albuterol as directed.  Other symptomatic treatment discussed.  Return precautions given.  Patient received understanding and agrees to plan.  Final Clinical Impressions(s) / UC Diagnoses   Final diagnoses:  Acute bronchitis, unspecified organism   ED Prescriptions    Medication Sig Dispense Auth. Provider   methylPREDNISolone (MEDROL) 4 MG tablet Take 1 tablet (4 mg total) by mouth daily. 5 tablet Chiana Wamser V, PA-C   ipratropium-albuterol (DUONEB) 0.5-2.5 (3) MG/3ML SOLN Take 3 mLs by nebulization every 6 (six) hours as needed. 60 mL Alaysiah Browder V, PA-C   albuterol (VENTOLIN HFA) 108 (90 Base) MCG/ACT inhaler Inhale 2 puffs into the lungs every 6 (six) hours as needed for wheezing or shortness of breath. 18 g [place] V, PA-C   albuterol (PROVENTIL) (2.5 MG/3ML) 0.083% nebulizer solution Take 3 mLs (2.5 mg total) by nebulization every 6 (six) hours  as needed for wheezing or shortness of breath. 75 mL Nyzier Boivin V, PA-C   doxycycline (VIBRAMYCIN) 100 MG capsule Take 1 capsule (100 mg total) by mouth 2 (two) times daily. 14 capsule Briston Lax V, PA-C   fluconazole (DIFLUCAN) 150 MG tablet Take 1 tablet (150 mg total) by mouth daily. Take second dose  72 hours later if symptoms still persists. 2 tablet Belinda Fisher, PA-C     PDMP not reviewed this encounter.   Belinda Fisher, PA-C 03/04/20 1558

## 2020-03-04 NOTE — Discharge Instructions (Signed)
Decadron injection in office today.  Start doxycycline, Medrol as directed.  DuoNeb/albuterol as needed.  Continue to monitor symptoms, please follow-up with PCP/dermatology if symptoms not improving.  If significant worsening of symptoms, worsening shortness of breath, chest pain, fever, go to [facility] department for further evaluation.

## 2020-03-04 NOTE — ED Triage Notes (Signed)
Pt c/o cough with clear sputum and nasal congestion x1wk today. States started having SOB on exertion on Monday. No distress noted. Pt speaking in complete sentences. States had COVID on 5/20 and received the infusion.

## 2020-03-16 ENCOUNTER — Ambulatory Visit: Payer: Medicaid Other | Admitting: Sports Medicine

## 2020-03-16 ENCOUNTER — Other Ambulatory Visit: Payer: Self-pay

## 2020-03-16 VITALS — BP 138/96 | Ht 68.5 in | Wt 290.0 lb

## 2020-03-16 DIAGNOSIS — Z630 Problems in relationship with spouse or partner: Secondary | ICD-10-CM | POA: Diagnosis not present

## 2020-03-16 DIAGNOSIS — M170 Bilateral primary osteoarthritis of knee: Secondary | ICD-10-CM | POA: Diagnosis not present

## 2020-03-16 DIAGNOSIS — M1711 Unilateral primary osteoarthritis, right knee: Secondary | ICD-10-CM

## 2020-03-16 DIAGNOSIS — M7071 Other bursitis of hip, right hip: Secondary | ICD-10-CM

## 2020-03-16 DIAGNOSIS — M1712 Unilateral primary osteoarthritis, left knee: Secondary | ICD-10-CM | POA: Diagnosis not present

## 2020-03-17 NOTE — Progress Notes (Signed)
Patient ID: Hayley Webb, female   DOB: [birth year], 34 y.o.   MRN: [id number]  Deolinda comes in today for follow-up on bilateral knee pain secondary to DJD.  Recent cortisone injections provided her with about 1 week of symptom relief.  Pain is beginning to return.  She is also beginning to experience some lateral right hip pain.  Physical exam was not repeated today.  We simply talked about her ongoing dilemma.  X-rays show osteoarthritis in both knees which are advanced for her young age.  I discussed physical therapy as a treatment and she would like to try this.  I also discussed viscosupplementation but she would like to wait on that for now.  I have asked her to call or email me in 4 weeks with an update as to how she is doing.  If she is continuing to have pain despite starting physical therapy then she may want to reconsider viscosupplementation.

## 2020-04-05 ENCOUNTER — Ambulatory Visit: Payer: Medicaid Other | Admitting: Physical Therapy

## 2020-04-14 ENCOUNTER — Ambulatory Visit: Payer: Medicaid Other | Attending: Sports Medicine | Admitting: Physical Therapy

## 2020-04-14 ENCOUNTER — Encounter: Payer: Self-pay | Admitting: Physical Therapy

## 2020-04-14 ENCOUNTER — Other Ambulatory Visit: Payer: Self-pay

## 2020-04-14 DIAGNOSIS — G8929 Other chronic pain: Secondary | ICD-10-CM

## 2020-04-14 DIAGNOSIS — R2689 Other abnormalities of gait and mobility: Secondary | ICD-10-CM

## 2020-04-14 DIAGNOSIS — M25561 Pain in right knee: Secondary | ICD-10-CM | POA: Insufficient documentation

## 2020-04-14 DIAGNOSIS — M25562 Pain in left knee: Secondary | ICD-10-CM | POA: Insufficient documentation

## 2020-04-14 DIAGNOSIS — M25551 Pain in right hip: Secondary | ICD-10-CM

## 2020-04-15 ENCOUNTER — Encounter: Payer: Self-pay | Admitting: Physical Therapy

## 2020-04-15 NOTE — Therapy (Addendum)
[facility] [address] [place], [place], [address] Phone: [phone]   Fax:  [phone]  Physical Therapy Evaluation  Patient Details  Name: Hayley Webb MRN: [id number] Date of Birth: [birth year] Referring Provider (PT): Dr Reino Bellis    Encounter Date: 04/14/2020   PT End of Session - 04/15/20 1252    Visit Number 1    Number of Visits 12    Date for PT Re-Evaluation 05/27/20    Authorization Type healthy [employer]    Authorization Time Period auth submitted    PT Start Time 1330    PT Stop Time 1414    PT Time Calculation (min) 44 min    Activity Tolerance Patient tolerated treatment well    Behavior During Therapy [facility] for tasks assessed/performed           Past Medical History:  Diagnosis Date  . Anxiety    doing good now  . Asthma   . Headache(784.0)   . HSV infection   . Hypertension   . Infection    UTI  . Lupus (HCC)    dx age 34  . Pulmonary embolism (HCC) 06/4/32011  . 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 ADENOIDECTOMY    . TONSILLECTOMY AND ADENOIDECTOMY  1998  . WISDOM TOOTH EXTRACTION      There were no vitals filed for this visit.    Subjective Assessment - 04/14/20 1339    Subjective Patient slipped and fell in [place]. Her right knee went underneath her. She has had bilateral knee and left hip pain since that time. She did not have knee pain prior to her fall. Claiborne Rigg the past few weeks she has begun to have more right hip pain as well. She has increase dpain with standing and walking. Her knee gets sstiff when she sits for a long period of time.    Pertinent History anxiety, PE (2015) Lupus    Limitations Sitting    How long can you sit comfortably? knees and hip stiffnes when she sits for too long    How long can you stand comfortably? > 10 minutes before pain    How long can you walk comfortably? 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 Yes    Pain Score 6     Pain Location Knee    Pain Orientation Left    Pain Descriptors / Indicators Aching    Pain Type Chronic pain    Pain Onset More than a month ago    Pain Frequency Constant    Aggravating Factors  standing , walking, steps    Pain Relieving Factors ice and heat    Effect of Pain on Daily Activities difficulty perfroming ADL's    Multiple Pain Sites Yes    Pain Score 7    Pain Location Knee    Pain Orientation Left    Pain Descriptors / Indicators Aching    Pain Type Acute pain    Pain Onset More than a month ago    Pain Frequency Constant    Aggravating Factors  standing , walking , steps    Pain Relieving Factors ice and heat    Effect of Pain on Daily Activities difficulty walking and standing  Pain Score 5    Pain Location Hip    Pain Orientation Right    Pain Descriptors / Indicators Aching    Pain Type Chronic pain    Pain Onset More than a month ago    Aggravating Factors  standing, walking, steps    Pain Relieving Factors rest    Effect of Pain on Daily Activities difficulty perfroming ADL's              [facility] PT Assessment - 04/15/20 0001      Assessment   Medical Diagnosis Bilateral Knee pain/ Right Hip pain     Referring Provider (PT) Dr Reino Bellis     Onset Date/Surgical Date --   March 2021    Hand Dominance Right    Next MD Visit Has to email MD after first week of therapy     Prior Therapy Has had for lower back       Precautions   Precautions None      Restrictions   Weight Bearing Restrictions No      Balance Screen   Has the patient fallen in the past 6 months Yes    How many times? 1    Has the patient had a decrease in activity level because of a fear of falling?  No    Is the patient reluctant to leave their home  because of a fear of falling?  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 It can happen at any time       Coordination   Gross Motor Movements are Fluid and Coordinated Yes    Fine Motor Movements are Fluid and Coordinated Yes      Posture/Postural Control   Posture Comments stands with left knee hyper extension       AROM   Right Knee Flexion --   Pain at end range    Left Knee Flexion --   More pain at end range then right      Strength   Right Hip Flexion 4+/5    Right Hip ABduction 4+/5    Left Hip Flexion 4+/5    Left Hip ABduction 4+/5    Right Knee Flexion 5/5    Right Knee Extension 5/5    Left Knee Flexion 5/5    Left Knee Extension 5/5      Palpation   Palpation comment area of tenderness and psosible calcification in the anterior lower knee cap; tender to palpation in right lateral hip and trochanter       Ambulation/Gait   Gait Comments lateral trunk shift to the right  Objective measurements completed on examination: See above findings.       [facility] Adult PT Treatment/Exercise - 04/15/20 0001      Knee/Hip Exercises: Stretches   Lobbyist Limitations thomas stretch 3x20 sec hold     Piriformis Stretch Limitations glute  stretch 3x20 sec hold       Knee/Hip Exercises: Supine   Quad Sets Limitations 2x10 5 sec hold     Other Supine Knee/Hip Exercises 4 way patella movement                   PT Education - 04/15/20 1252    Education Details HEP and symptom management    Person(s) Educated Patient    Methods Explanation;Demonstration;Tactile cues;Verbal cues    Comprehension Returned demonstration;Verbalized understanding;Verbal cues required;Tactile cues required            PT Short Term Goals - 04/15/20 1306      PT SHORT TERM GOAL #1   Title Patient will demonstrate flull passvie knee flexion without pain bilateral    Time 3    Period Weeks    Status New    Target Date 05/06/20      PT SHORT TERM GOAL #2   Title Patient will increase bilateral LE strength to 5/5    Time 3    Period Weeks    Status New    Target Date 05/06/20      PT SHORT TERM GOAL #3   Title Patient will be independent with intial HEP for strengthening    Time 3    Period Weeks    Status New    Target Date 05/06/20             PT Long Term Goals - 04/15/20 1308      PT LONG TERM GOAL #1   Title Patient will ambualte 3000' without increase knee and hip pain    Time 6    Period Weeks    Status New    Target Date 05/27/20      PT LONG TERM GOAL #2   Title Patient will bend down to pick object off the ground without pain    Time 6    Period Weeks    Status New    Target Date 05/27/20                  Plan - 04/15/20 1253    Clinical Impression Statement Patient is a 34 year old female with bilateral knee pain and right hip pain. Her left knee hurts worse then the right. she has pain with endr ange passive and acive knee motion. She has limited inferior/superior patella mobiity. She has a palpable nodule on her left patella. She has tenderness to palpation in the patellar tendon. Sings and sympts are constent with patella tendinitis. She shifts her weight to the right with ambualtion. She  has tendernes to palpation in her right lateral hip that is likley effected by her gait. She has bilateral LE weakness. She would benefit from skilled therapy to reduce her pain and improve her ability to exercises.    Personal Factors and Comorbidities Fitness;Comorbidity 1;Comorbidity 2    Comorbidities anxiety, lupus    Examination-Activity Limitations Squat;Stairs;Stand;Lift;Sit;Bend    Examination-Participation Restrictions Cleaning;Community Activity;Driving;Shop    Stability/Clinical Decision Making Evolving/Moderate complexity    Clinical Decision Making Moderate    Rehab Potential Good    PT Frequency 1x / week    PT Duration 6 weeks  PT Treatment/Interventions ADLs/Self Care Home Management;Iontophoresis 4mg /ml Dexamethasone;Gait training;DME Instruction;Moist Heat;Cryotherapy;Therapeutic activities;Therapeutic exercise;Neuromuscular re-education;Patient/family education;Manual techniques;Passive range of motion;Taping;Dry needling;Functional mobility training    PT Next Visit Plan add stright leg; bridge ; hip strengthening; progress to standing as tolerated. review stretches; assess patella mobility; consider patella taping or iontophoresis;    PT Home Exercise Plan thomas stretch, glute stretch, self patella mobilization; quad set    Consulted and Agree with Plan of Care Patient           Patient will benefit from skilled therapeutic intervention in order to improve the following deficits and impairments:  Abnormal gait, Difficulty walking, Decreased range of motion, Pain, Obesity, Decreased endurance, Decreased strength, Decreased activity tolerance  Visit Diagnosis: Chronic pain of left knee  Chronic pain of right knee  Pain in right hip  Other abnormalities of gait and mobility   Check all possible CPT codes:      []  97110 (Therapeutic Exercise)  []  92507 (SLP Treatment)  []  97112 (Neuro Re-ed)   []  92526 (Swallowing Treatment)   []  97116 (Gait Training)   []  [id number]  (Cognitive Training, 1st 15 minutes) []  97140 (Manual Therapy)   []  97130 (Cognitive Training, each add'l 15 minutes)  []  97530 (Therapeutic Activities)  []  Other, List CPT Code ____________    []  97535 (Self Care)       [x]  All codes above (97110 - 97535)  []  97012 (Mechanical Traction)  []  97014 (E-stim Unattended)  []  97032 (E-stim manual)  []  97033 (Ionto)  []  [id number] (Ultrasound)  []  97016 (Vaso)  []  97760 (Orthotic Fit) []  [id number] (Prosthetic Training) []  [id number] (Physical Performance Training) []  [id number] (Aquatic Therapy) []  [id number] (Canalith Repositioning) []  [id number] (Contrast Bath) []  [id number] (Paraffin) []  97597 (Wound Care 1st 20 sq cm) []  97598 (Wound Care each add'l 20 sq cm)      Problem List Patient Active Problem List   Diagnosis Date Noted  . Knee pain 01/12/2020  . Postpartum anemia 01/17/2019  . Normal postpartum course 01/17/2019  . Chronic hypertension complicating or reason for care during pregnancy, third trimester 01/12/2019  . Threatened preterm labor 12/25/2018  . Threatened preterm labor, third trimester 12/23/2018  . Preterm labor 12/23/2018  . Depressive disorder 12/23/2018  . Anxiety disorder 12/23/2018  . Sicca syndrome (HCC) 08/27/2018  . Chronic hypertension affecting pregnancy 07/25/2018  . Pregnant 07/01/2018  . Maternal obesity affecting pregnancy, antepartum 06/11/2018  . Allergy to shellfish 06/11/2018  . Chest pain 04/04/2017  . On continuous oral anticoagulation 04/04/2017  . 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 Nonintractable headache 04/04/2017  . Altered thought processes 04/04/2017  . Word finding difficulty 04/04/2017  . Antiphospholipid antibody syndrome (HCC) 02/10/2017  . Pulmonary embolus (HCC) 02/09/2017  . Pulmonary embolism (HCC) 12/26/2016  . Shortness of breath   . History of pulmonary embolus (PE) 07/24/2016  . Vaginal delivery--VE assist 10/25/2013  . 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 Obesity-BMI 47 05/09/2013  . Hirsutism 10/02/2012  . STD (female) 01/18/2012  . Lupus (HCC)   . Asthma   . HSV infection     Dessie ComaDavid J Dionis Autry 04/15/2020, 1:19 PM  [facility] [address] [place], [place], [address] Phone: [phone]   Fax:  [phone]  Name: Hayley Webb MRN: [id number] Date of Birth: 11-01-1985

## 2020-04-22 ENCOUNTER — Ambulatory Visit: Payer: Medicaid Other | Admitting: Physical Therapy

## 2020-05-03 ENCOUNTER — Ambulatory Visit: Payer: Medicaid Other | Admitting: Registered Nurse

## 2020-05-03 ENCOUNTER — Other Ambulatory Visit: Payer: Self-pay

## 2020-05-03 ENCOUNTER — Encounter: Payer: Self-pay | Admitting: Registered Nurse

## 2020-05-03 VITALS — BP 144/92 | HR 65 | Temp 98.0°F | Ht 68.5 in | Wt 308.0 lb

## 2020-05-03 DIAGNOSIS — Z23 Encounter for immunization: Secondary | ICD-10-CM

## 2020-05-03 DIAGNOSIS — M549 Dorsalgia, unspecified: Secondary | ICD-10-CM | POA: Diagnosis not present

## 2020-05-03 DIAGNOSIS — R309 Painful micturition, unspecified: Secondary | ICD-10-CM

## 2020-05-03 DIAGNOSIS — O99345 Other mental disorders complicating the puerperium: Secondary | ICD-10-CM | POA: Diagnosis not present

## 2020-05-03 DIAGNOSIS — Z13220 Encounter for screening for lipoid disorders: Secondary | ICD-10-CM | POA: Diagnosis not present

## 2020-05-03 DIAGNOSIS — Z1329 Encounter for screening for other suspected endocrine disorder: Secondary | ICD-10-CM

## 2020-05-03 DIAGNOSIS — Z87892 Personal history of anaphylaxis: Secondary | ICD-10-CM | POA: Diagnosis not present

## 2020-05-03 DIAGNOSIS — R3129 Other microscopic hematuria: Secondary | ICD-10-CM

## 2020-05-03 DIAGNOSIS — Z13228 Encounter for screening for other metabolic disorders: Secondary | ICD-10-CM | POA: Diagnosis not present

## 2020-05-03 DIAGNOSIS — Z130 Encounter for screening for diseases of the blood and blood-forming organs and certain disorders involving the immune mechanism: Secondary | ICD-10-CM | POA: Diagnosis not present

## 2020-05-03 DIAGNOSIS — F53 Postpartum depression: Secondary | ICD-10-CM

## 2020-05-03 DIAGNOSIS — Z7689 Persons encountering health services in other specified circumstances: Secondary | ICD-10-CM

## 2020-05-03 LAB — POCT URINALYSIS DIP (MANUAL ENTRY)
Bilirubin, UA: NEGATIVE
Glucose, UA: NEGATIVE mg/dL
Ketones, POC UA: NEGATIVE mg/dL
Leukocytes, UA: NEGATIVE
Nitrite, UA: NEGATIVE
Protein Ur, POC: NEGATIVE mg/dL
Spec Grav, UA: 1.02 (ref 1.010–1.025)
Urobilinogen, UA: 0.2 E.U./dL
pH, UA: 5.5 (ref 5.0–8.0)

## 2020-05-03 LAB — POCT GLYCOSYLATED HEMOGLOBIN (HGB A1C): Hemoglobin A1C: 5.6 % (ref 4.0–5.6)

## 2020-05-03 MED ORDER — SERTRALINE HCL 50 MG PO TABS: 50.0000 mg | ORAL_TABLET | Freq: Every day | ORAL | 0 refills | Status: DC

## 2020-05-03 MED ORDER — METHOCARBAMOL 500 MG PO TABS: 500.0000 mg | ORAL_TABLET | Freq: Four times a day (QID) | ORAL | 0 refills | Status: DC

## 2020-05-03 MED ORDER — EPINEPHRINE 0.1 MG/0.1ML IJ SOAJ: 1.0000 | INTRAMUSCULAR | 2 refills | Status: DC | PRN

## 2020-05-03 NOTE — Patient Instructions (Signed)
° ° ° °  If you have lab work done today you will be contacted with your lab results within the next 2 weeks.  If you have not heard from us then please contact us. The fastest way to get your results is to register for My Chart. ° ° °IF you received an x-ray today, you will receive an invoice from [facility]. Please contact [facility] at [phone] with questions or concerns regarding your invoice.  ° °IF you received labwork today, you will receive an invoice from [facility]. Please contact [facility] at [phone] with questions or concerns regarding your invoice.  ° °Our billing staff will not be able to assist you with questions regarding bills from these companies. ° °You will be contacted with the lab results as soon as they are available. The fastest way to get your results is to activate your My Chart account. Instructions are located on the last page of this paperwork. If you have not heard from us regarding the results in 2 weeks, please contact this office. °  ° ° ° °

## 2020-05-04 ENCOUNTER — Encounter: Payer: Self-pay | Admitting: Physical Therapy

## 2020-05-04 ENCOUNTER — Ambulatory Visit: Payer: Medicaid Other | Attending: Sports Medicine | Admitting: Physical Therapy

## 2020-05-04 DIAGNOSIS — M25561 Pain in right knee: Secondary | ICD-10-CM | POA: Insufficient documentation

## 2020-05-04 DIAGNOSIS — R2689 Other abnormalities of gait and mobility: Secondary | ICD-10-CM | POA: Insufficient documentation

## 2020-05-04 DIAGNOSIS — G8929 Other chronic pain: Secondary | ICD-10-CM | POA: Insufficient documentation

## 2020-05-04 DIAGNOSIS — M25562 Pain in left knee: Secondary | ICD-10-CM | POA: Insufficient documentation

## 2020-05-04 DIAGNOSIS — M25551 Pain in right hip: Secondary | ICD-10-CM | POA: Insufficient documentation

## 2020-05-04 LAB — CBC WITH DIFFERENTIAL
Basophils Absolute: 0 10*3/uL (ref 0.0–0.2)
Basos: 1 %
EOS (ABSOLUTE): 0.3 10*3/uL (ref 0.0–0.4)
Eos: 6 %
Hematocrit: 38.1 % (ref 34.0–46.6)
Hemoglobin: 12.6 g/dL (ref 11.1–15.9)
Immature Grans (Abs): 0 10*3/uL (ref 0.0–0.1)
Immature Granulocytes: 0 %
Lymphocytes Absolute: 2 10*3/uL (ref 0.7–3.1)
Lymphs: 42 %
MCH: 28.7 pg (ref 26.6–33.0)
MCHC: 33.1 g/dL (ref 31.5–35.7)
MCV: 87 fL (ref 79–97)
Monocytes Absolute: 0.3 10*3/uL (ref 0.1–0.9)
Monocytes: 7 %
Neutrophils Absolute: 2.1 10*3/uL (ref 1.4–7.0)
Neutrophils: 44 %
RBC: 4.39 x10E6/uL (ref 3.77–5.28)
RDW: 14 % (ref 11.7–15.4)
WBC: 4.7 10*3/uL (ref 3.4–10.8)

## 2020-05-04 LAB — LIPID PANEL
Chol/HDL Ratio: 4.1 ratio (ref 0.0–4.4)
Cholesterol, Total: 203 mg/dL — ABNORMAL HIGH (ref 100–199)
HDL: 50 mg/dL (ref >39)
LDL Chol Calc (NIH): 142 mg/dL — ABNORMAL HIGH (ref 0–99)
Triglycerides: 62 mg/dL (ref 0–149)
VLDL Cholesterol Cal: 11 mg/dL (ref 5–40)

## 2020-05-04 LAB — COMPREHENSIVE METABOLIC PANEL
ALT: 14 IU/L (ref 0–32)
AST: 15 IU/L (ref 0–40)
Albumin/Globulin Ratio: 1.8 (ref 1.2–2.2)
Albumin: 4.4 g/dL (ref 3.8–4.8)
Alkaline Phosphatase: 98 IU/L (ref 44–121)
BUN/Creatinine Ratio: 16 (ref 9–23)
BUN: 10 mg/dL (ref 6–20)
Bilirubin Total: 0.3 mg/dL (ref 0.0–1.2)
CO2: 22 mmol/L (ref 20–29)
Calcium: 9.6 mg/dL (ref 8.7–10.2)
Chloride: 101 mmol/L (ref 96–106)
Creatinine, Ser: 0.61 mg/dL (ref 0.57–1.00)
GFR calc Af Amer: 139 mL/min/{1.73_m2} (ref >59)
GFR calc non Af Amer: 120 mL/min/{1.73_m2} (ref >59)
Globulin, Total: 2.5 g/dL (ref 1.5–4.5)
Glucose: 84 mg/dL (ref 65–99)
Potassium: 4.3 mmol/L (ref 3.5–5.2)
Sodium: 137 mmol/L (ref 134–144)
Total Protein: 6.9 g/dL (ref 6.0–8.5)

## 2020-05-04 LAB — TSH: TSH: 0.958 u[IU]/mL (ref 0.450–4.500)

## 2020-05-04 NOTE — Therapy (Signed)
[facility] [address] [place], [place], [address] Phone: [phone]   Fax:  [phone]  Physical Therapy Treatment  Patient Details  Name: Hayley Webb MRN: [id number] Date of Birth: Apr 08, 1986 Referring Provider (PT): Dr Reino Bellis    Encounter Date: 05/04/2020   PT End of Session - 05/04/20 0812    Visit Number 2    Number of Visits 12    Date for PT Re-Evaluation 05/27/20    Authorization Type healthy [employer]    PT Start Time 0804    PT Stop Time 0845    PT Time Calculation (min) 41 min    Activity Tolerance Patient tolerated treatment well    Behavior During Therapy [facility] for tasks assessed/performed           Past Medical History:  Diagnosis Date  . Anxiety    doing good now  . Asthma   . Headache(784.0)   . HSV infection   . Hypertension   . Infection    UTI  . Lupus (HCC)    dx age 28  . Pulmonary embolism (HCC) 06/4/82011  . 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 ADENOIDECTOMY    . TONSILLECTOMY AND ADENOIDECTOMY  1998  . WISDOM TOOTH EXTRACTION      There were no vitals filed for this visit.   Subjective Assessment - 05/04/20 0810    Subjective Patient reports she has had no knee and hip pain today. She had a little knee pain yesterda but she did a lot of going up and down the steps.    How long can you sit comfortably? knees and hip stiffnes when she sits for too long    How long can you stand comfortably? > 10 minutes before pain    How long can you walk comfortably? 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 No/denies                             [facility] Adult PT Treatment/Exercise - 05/04/20 0001      Lumbar Exercises: Supine   Clam Limitations x20 green     Bridge Limitations 2x10      Straight Leg Raises Limitations 2x10 right       Knee/Hip Exercises: Stretches   Lobbyist Limitations thomas stretch 3x20 sec hold     Piriformis Stretch Limitations glute stretch 3x20 sec hold       Knee/Hip Exercises: Standing   Heel Raises Limitations x20     Hip Flexion Limitations slow march x15 each leg                   PT Education - 05/04/20 0811    Education Details updated HEP for standing exercises    Person(s) Educated Patient    Methods Explanation;Tactile cues;Demonstration;Verbal cues    Comprehension Returned demonstration;Verbal cues required;Verbalized understanding;Tactile cues required            PT Short Term Goals - 04/15/20 1306      PT SHORT TERM GOAL #1   Title Patient will demonstrate flull passvie knee flexion without pain bilateral    Time 3    Period Weeks    Status New    Target Date 05/06/20  PT SHORT TERM GOAL #2   Title Patient will increase bilateral LE strength to 5/5    Time 3    Period Weeks    Status New    Target Date 05/06/20      PT SHORT TERM GOAL #3   Title Patient will be independent with intial HEP for strengthening    Time 3    Period Weeks    Status New    Target Date 05/06/20             PT Long Term Goals - 04/15/20 1308      PT LONG TERM GOAL #1   Title Patient will ambualte 3000' without increase knee and hip pain    Time 6    Period Weeks    Status New    Target Date 05/27/20      PT LONG TERM GOAL #2   Title Patient will bend down to pick object off the ground without pain    Time 6    Period Weeks    Status New    Target Date 05/27/20                 Plan - 05/04/20 1335    Clinical Impression Statement Therapy updated HEP. She was given harder strengthening exercises. she did well. She reported fatigue but no pain. She was given exercises for home. she was advised to continue with her stretches at home.    Personal Factors and Comorbidities Fitness;Comorbidity  1;Comorbidity 2    Comorbidities anxiety, lupus    Examination-Activity Limitations Squat;Stairs;Stand;Lift;Sit;Bend    Examination-Participation Restrictions Cleaning;Community Activity;Driving;Shop    Stability/Clinical Decision Making Evolving/Moderate complexity    Clinical Decision Making Moderate    Rehab Potential Good    PT Frequency 1x / week    PT Duration 6 weeks    PT Treatment/Interventions ADLs/Self Care Home Management;Iontophoresis 4mg /ml Dexamethasone;Gait training;DME Instruction;Moist Heat;Cryotherapy;Therapeutic activities;Therapeutic exercise;Neuromuscular re-education;Patient/family education;Manual techniques;Passive range of motion;Taping;Dry needling;Functional mobility training    PT Next Visit Plan add stright leg; bridge ; hip strengthening; progress to standing as tolerated. review stretches; assess patella mobility; consider patella taping or iontophoresis;    PT Home Exercise Plan thomas stretch, glute stretch, self patella mobilization; quad set    Consulted and Agree with Plan of Care Patient           Patient will benefit from skilled therapeutic intervention in order to improve the following deficits and impairments:  Abnormal gait, Difficulty walking, Decreased range of motion, Pain, Obesity, Decreased endurance, Decreased strength, Decreased activity tolerance  Visit Diagnosis: Chronic pain of left knee  Chronic pain of right knee  Pain in right hip  Other abnormalities of gait and mobility     Problem List Patient Active Problem List   Diagnosis Date Noted  . Knee pain 01/12/2020  . Postpartum anemia 01/17/2019  . Normal postpartum course 01/17/2019  . Chronic hypertension complicating or reason for care during pregnancy, third trimester 01/12/2019  . Threatened preterm labor 12/25/2018  . Threatened preterm labor, third trimester 12/23/2018  . Preterm labor 12/23/2018  . Depressive disorder 12/23/2018  . Anxiety disorder 12/23/2018  .  Sicca syndrome (HCC) 08/27/2018  . Chronic hypertension affecting pregnancy 07/25/2018  . Pregnant 07/01/2018  . Maternal obesity affecting pregnancy, antepartum 06/11/2018  . Allergy to shellfish 06/11/2018  . Chest pain 04/04/2017  . On continuous oral anticoagulation 04/04/2017  . 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 Nonintractable headache 04/04/2017  . Altered thought  processes 04/04/2017  . Word finding difficulty 04/04/2017  . Antiphospholipid antibody syndrome (HCC) 02/10/2017  . Pulmonary embolus (HCC) 02/09/2017  . Pulmonary embolism (HCC) 12/26/2016  . Shortness of breath   . History of pulmonary embolus (PE) 07/24/2016  . Vaginal delivery--VE assist 10/25/2013  . 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 Obesity-BMI 47 05/09/2013  . Hirsutism 10/02/2012  . STD (female) 01/18/2012  . Lupus (HCC)   . Asthma   . HSV infection     Dessie Coma PT DPT  05/04/2020, 2:47 PM  [facility] [address] [place], [place], [address] Phone: [phone]   Fax:  [phone]  Name: Hayley Webb MRN: [id number] Date of Birth: March 01, 1986

## 2020-05-05 LAB — URINE CULTURE

## 2020-05-11 ENCOUNTER — Ambulatory Visit: Payer: Medicaid Other | Admitting: Physical Therapy

## 2020-05-11 ENCOUNTER — Encounter: Payer: Self-pay | Admitting: Physical Therapy

## 2020-05-11 ENCOUNTER — Other Ambulatory Visit: Payer: Self-pay

## 2020-05-11 DIAGNOSIS — M25562 Pain in left knee: Secondary | ICD-10-CM | POA: Diagnosis not present

## 2020-05-11 DIAGNOSIS — M25551 Pain in right hip: Secondary | ICD-10-CM

## 2020-05-11 DIAGNOSIS — R2689 Other abnormalities of gait and mobility: Secondary | ICD-10-CM

## 2020-05-11 DIAGNOSIS — G8929 Other chronic pain: Secondary | ICD-10-CM | POA: Diagnosis not present

## 2020-05-11 DIAGNOSIS — M25561 Pain in right knee: Secondary | ICD-10-CM | POA: Diagnosis not present

## 2020-05-11 NOTE — Therapy (Signed)
[facility] [address] [place], [place], [address] Phone: [phone]   Fax:  [phone]  Physical Therapy Treatment  Patient Details  Name: Hayley Webb MRN: [id number] Date of Birth: 1986/03/11 Referring Provider (PT): Dr Reino Bellis    Encounter Date: 05/11/2020   PT End of Session - 05/11/20 0823    Visit Number 3    Number of Visits 12    Date for PT Re-Evaluation 05/27/20    Authorization Type [employer]    Authorization Time Period auth submitted    PT Start Time 0809    PT Stop Time 0845    PT Time Calculation (min) 36 min    Activity Tolerance Patient tolerated treatment well    Behavior During Therapy [facility] for tasks assessed/performed           Past Medical History:  Diagnosis Date   Anxiety    doing good now   Asthma    Headache(784.0)    HSV infection    Hypertension    Infection    UTI   Lupus [facility])    dx age 34   Pulmonary embolism (HCC) 06/4/82011   STD (sexually transmitted disease) 02/2009   POSITIVE GC    Past Surgical History:  Procedure Laterality Date   ADENOIDECTOMY     TONSILLECTOMY AND ADENOIDECTOMY  1998   WISDOM TOOTH EXTRACTION      There were no vitals filed for this visit.   Subjective Assessment - 05/11/20 0813    Subjective Patient went to [place] over the weekend. They drove up. She did a lot of walking. her knee is a little sore.    Pertinent History anxiety, PE (2015) Lupus    Limitations Sitting    How long can you sit comfortably? knees and hip stiffnes when she sits for too long    How long can you stand comfortably? > 10 minutes before pain    How long can you walk comfortably? 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 Yes    Pain Score 3     Pain Location Knee    Pain  Orientation Left    Pain Descriptors / Indicators Aching    Pain Type Chronic pain    Pain Onset More than a month ago    Pain Frequency Constant    Aggravating Factors  standing and walking    Pain Relieving Factors ice and heat    Effect of Pain on Daily Activities difficulty perfroming ADL's    Multiple Pain Sites No                             [facility] Adult PT Treatment/Exercise - 05/11/20 0001      Knee/Hip Exercises: Stretches   Passive Hamstring Stretch Limitations 2x20 sec hold     Other Knee/Hip Stretches 2x20 sec hold       Knee/Hip Exercises: Standing   Heel Raises Limitations x20     Hip Flexion Limitations slow march x15 each leg     Other Standing Knee Exercises hip abduction x10 bilateral hip extension x10 bilateral       Knee/Hip Exercises: Supine   Short Arc Quad Sets Limitations 2x10     Bridges Limitations 2x10 bilateral  Straight Leg Raises Limitations 2x10 bilateral                   PT Education - 05/11/20 0822    Education Details reviewed symptom mangement during exacerbations    Person(s) Educated Patient    Methods Explanation;Demonstration;Tactile cues;Verbal cues    Comprehension Returned demonstration;Verbal cues required;Tactile cues required;Verbalized understanding            PT Short Term Goals - 05/11/20 0910      PT SHORT TERM GOAL #1   Title Patient will demonstrate flull passvie knee flexion without pain bilateral    Time 3    Period Weeks    Status On-going    Target Date 05/06/20      PT SHORT TERM GOAL #2   Title Patient will increase bilateral LE strength to 5/5    Time 3    Period Weeks    Status On-going    Target Date 05/06/20      PT SHORT TERM GOAL #3   Title Patient will be independent with intial HEP for strengthening    Time 3    Period Weeks    Status On-going    Target Date 05/06/20             PT Long Term Goals - 04/15/20 1308      PT LONG TERM GOAL #1   Title Patient  will ambualte 3000' without increase knee and hip pain    Time 6    Period Weeks    Status New    Target Date 05/27/20      PT LONG TERM GOAL #2   Title Patient will bend down to pick object off the ground without pain    Time 6    Period Weeks    Status New    Target Date 05/27/20                 Plan - 05/11/20 0825    Clinical Impression Statement Depsite baseline pain the patient tolerated treatment well. She had no significant increase in pain. Therapy reviewed stretching to reduce pain and soreness. She was advised to continue HEP at home.    Personal Factors and Comorbidities Fitness;Comorbidity 1;Comorbidity 2    Comorbidities anxiety, lupus    Examination-Activity Limitations Squat;Stairs;Stand;Lift;Sit;Bend    Examination-Participation Restrictions Cleaning;Community Activity;Driving;Shop    Stability/Clinical Decision Making Evolving/Moderate complexity    Clinical Decision Making Moderate    Rehab Potential Good    PT Frequency 1x / week    PT Duration 6 weeks    PT Treatment/Interventions ADLs/Self Care Home Management;Iontophoresis 4mg /ml Dexamethasone;Gait training;DME Instruction;Moist Heat;Cryotherapy;Therapeutic activities;Therapeutic exercise;Neuromuscular re-education;Patient/family education;Manual techniques;Passive range of motion;Taping;Dry needling;Functional mobility training    PT Next Visit Plan add stright leg; bridge ; hip strengthening; progress to standing as tolerated. review stretches; assess patella mobility; consider patella taping or iontophoresis;    PT Home Exercise Plan thomas stretch, glute stretch, self patella mobilization; quad set    Consulted and Agree with Plan of Care Patient           Patient will benefit from skilled therapeutic intervention in order to improve the following deficits and impairments:  Abnormal gait, Difficulty walking, Decreased range of motion, Pain, Obesity, Decreased endurance, Decreased strength,  Decreased activity tolerance  Visit Diagnosis: Chronic pain of left knee  Chronic pain of right knee  Pain in right hip  Other abnormalities of gait and mobility     Problem List  Patient Active Problem List   Diagnosis Date Noted   Knee pain 01/12/2020   Postpartum anemia 01/17/2019   Normal postpartum course 01/17/2019   Chronic hypertension complicating or reason for care during pregnancy, third trimester 01/12/2019   Threatened preterm labor 12/25/2018   Threatened preterm labor, third trimester 12/23/2018   Preterm labor 12/23/2018   Depressive disorder 12/23/2018   Anxiety disorder 12/23/2018   Sicca syndrome (HCC) 08/27/2018   Chronic hypertension affecting pregnancy 07/25/2018   Pregnant 07/01/2018   Maternal obesity affecting pregnancy, antepartum 06/11/2018   Allergy to shellfish 06/11/2018   Chest pain 04/04/2017   On continuous oral anticoagulation 04/04/2017   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   Nonintractable headache 04/04/2017   Altered thought processes 04/04/2017   Word finding difficulty 04/04/2017   Antiphospholipid antibody syndrome (HCC) 02/10/2017   Pulmonary embolus (HCC) 02/09/2017   Pulmonary embolism (HCC) 12/26/2016   Shortness of breath    History of pulmonary embolus (PE) 07/24/2016   Vaginal delivery--VE assist 10/25/2013   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

## 2020-05-18 ENCOUNTER — Ambulatory Visit: Payer: Medicaid Other | Admitting: Physical Therapy

## 2020-05-24 DIAGNOSIS — F32A Depression, unspecified: Secondary | ICD-10-CM | POA: Diagnosis not present

## 2020-05-24 DIAGNOSIS — I10 Essential (primary) hypertension: Secondary | ICD-10-CM | POA: Insufficient documentation

## 2020-05-24 DIAGNOSIS — Z6841 Body Mass Index (BMI) 40.0 and over, adult: Secondary | ICD-10-CM | POA: Diagnosis not present

## 2020-05-24 DIAGNOSIS — Z86711 Personal history of pulmonary embolism: Secondary | ICD-10-CM | POA: Diagnosis not present

## 2020-05-24 DIAGNOSIS — E785 Hyperlipidemia, unspecified: Secondary | ICD-10-CM | POA: Diagnosis not present

## 2020-05-24 DIAGNOSIS — J45909 Unspecified asthma, uncomplicated: Secondary | ICD-10-CM | POA: Diagnosis not present

## 2020-05-24 DIAGNOSIS — E782 Mixed hyperlipidemia: Secondary | ICD-10-CM | POA: Insufficient documentation

## 2020-06-02 ENCOUNTER — Other Ambulatory Visit: Payer: Self-pay

## 2020-06-02 ENCOUNTER — Ambulatory Visit: Payer: Medicaid Other | Attending: Sports Medicine | Admitting: Physical Therapy

## 2020-06-02 ENCOUNTER — Encounter: Payer: Self-pay | Admitting: Physical Therapy

## 2020-06-02 DIAGNOSIS — R2689 Other abnormalities of gait and mobility: Secondary | ICD-10-CM | POA: Insufficient documentation

## 2020-06-02 DIAGNOSIS — M25551 Pain in right hip: Secondary | ICD-10-CM | POA: Insufficient documentation

## 2020-06-02 DIAGNOSIS — M25562 Pain in left knee: Secondary | ICD-10-CM | POA: Insufficient documentation

## 2020-06-02 DIAGNOSIS — G8929 Other chronic pain: Secondary | ICD-10-CM | POA: Insufficient documentation

## 2020-06-02 DIAGNOSIS — M25561 Pain in right knee: Secondary | ICD-10-CM | POA: Insufficient documentation

## 2020-06-03 ENCOUNTER — Encounter: Payer: Self-pay | Admitting: Physical Therapy

## 2020-06-03 NOTE — Therapy (Signed)
[place], [place], [address] Phone: [phone]   Fax:  [phone]  Physical Therapy Treatment/Discharge   Patient Details  Name: Hayley Webb MRN: [id number] Date of Birth: 07-13-1986 Referring Provider (PT): Dr Lilia Argue    Encounter Date: 06/02/2020   PT End of Session - 06/02/20 1157    Visit Number 4    Number of Visits 12    Date for PT Re-Evaluation 05/27/20    Authorization Type [employer]    PT Start Time 1150    PT Stop Time 1233    PT Time Calculation (min) 43 min    Activity Tolerance Patient tolerated treatment well    Behavior During Therapy [facility] for tasks assessed/performed           Past Medical History:  Diagnosis Date  . Anxiety    doing good now  . Asthma   . Headache(784.0)   . HSV infection   . Hypertension   . Infection    UTI  . Lupus ([place])    dx age 34  . Pulmonary embolism ([place]) 06/4/82011  . 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 ADENOIDECTOMY    . TONSILLECTOMY AND ADENOIDECTOMY  1998  . WISDOM TOOTH EXTRACTION      There were no vitals filed for this visit.   Subjective Assessment - 06/02/20 1156    Subjective Patient has had minor right hip pain. She feels it most when she is going up and down steps in the left lateral hip. Her bilateral knees have done well.    Pertinent History anxiety, PE (2015) Lupus    How long can you sit comfortably? knees and hip stiffnes when she sits for too long    How long can you stand comfortably? > 10 minutes before pain    How long can you walk comfortably? 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 Yes    Pain Score 5     Pain Location Hip    Pain Orientation Left    Pain  Descriptors / Indicators Aching    Pain Type Chronic pain    Pain Onset More than a month ago    Pain Frequency Constant    Aggravating Factors  standing and walking    Pain Relieving Factors ice and heat    Effect of Pain on Daily Activities difficulty perfroming ADL's    Multiple Pain Sites No                             [facility] Adult PT Treatment/Exercise - 06/03/20 0001      Self-Care   Self-Care Other Self-Care Comments    Other Self-Care Comments  reviewed how to progress activity going forward ; reviewed how to use her proram at home depending on her pain level.       Lumbar Exercises: Supine   Clam Limitations x20 blue     Bridge Limitations 2x10     Straight Leg Raises Limitations 2x10 right       Knee/Hip Exercises: Stretches   Passive Hamstring Stretch Limitations 2x20 sec hold     Piriformis Stretch Limitations glute stretch 3x20 sec hold  Other Knee/Hip Stretches reviiewed single knee tochest stretch;     Other Knee/Hip Stretches reviewed roller to IT band       Knee/Hip Exercises: Standing   Heel Raises Limitations x20     Hip Flexion Limitations slow march x15 each leg     Other Standing Knee Exercises reviewed proper suqat trechnqiue with cuing. Patient able to complete but feels like her weight goes posterior. Minor pain in her knee with squat. PAtient advised to try 2-3x a week to start 5-10 trimes and to listen to her knee.                   PT Education - 06/02/20 1157    Education Details reviewed final HEP    Person(s) Educated Patient    Methods Explanation;Demonstration;Tactile cues;Verbal cues    Comprehension Returned demonstration;Verbalized understanding;Verbal cues required;Tactile cues required            PT Short Term Goals - 06/03/20 1122      PT SHORT TERM GOAL #1   Title Patient will demonstrate flull passvie knee flexion without pain bilateral    Time 3    Period Weeks    Status Achieved    Target Date  05/06/20      PT SHORT TERM GOAL #2   Title Patient will increase bilateral LE strength to 5/5    Baseline 5/5    Time 3    Period Weeks    Status Achieved    Target Date 05/06/20      PT SHORT TERM GOAL #3   Title Patient will be independent with intial HEP for strengthening    Time 3    Period Weeks    Status Achieved    Target Date 05/06/20             PT Long Term Goals - 06/03/20 1123      PT LONG TERM GOAL #1   Title Patient will ambualte 3000' without increase knee and hip pain    Baseline mild pain at times but for the most part she is ambualting without pain    Time 6    Period Weeks    Status Achieved      PT LONG TERM GOAL #2   Title Patient will bend down to pick object off the ground without pain    Baseline reviewed lifting technique today. Is able to bend down and help her kids most fdays without significant pain    Time 6    Period Weeks    Status Achieved                 Plan - 06/02/20 1208    Clinical Impression Statement Patient has made good progress. She has been perfroming all of her exercises at home. She has mild pain at times in her hip and her knee but has strategies to manage her pain. She has been exercising as much as able with two young children at home. Therapy reviewwed self lateral hip roll out today with a roller for home. She tolerated well. She was advised that this will be a long term program that she needs to do in conjunction with her weight loss plan that she is working on. She understands and is motivated to continue. D/C at this time to HEP.Extensive time today spent reviewing bending and lifting technique to protect her knees and hips in the future.    Personal Factors and Comorbidities Fitness;Comorbidity 1;Comorbidity 2  Comorbidities anxiety, lupus    Examination-Activity Limitations Squat;Stairs;Stand;Lift;Sit;Bend    Examination-Participation Restrictions Cleaning;Community Activity;Driving;Shop     Stability/Clinical Decision Making Evolving/Moderate complexity    Clinical Decision Making Moderate    Rehab Potential Good    PT Frequency 1x / week    PT Duration 6 weeks    PT Treatment/Interventions ADLs/Self Care Home Management;Iontophoresis 70m/ml Dexamethasone;Gait training;DME Instruction;Moist Heat;Cryotherapy;Therapeutic activities;Therapeutic exercise;Neuromuscular re-education;Patient/family education;Manual techniques;Passive range of motion;Taping;Dry needling;Functional mobility training    PT Next Visit Plan add stright leg; bridge ; hip strengthening; progress to standing as tolerated. review stretches; assess patella mobility; consider patella taping or iontophoresis;    PT Home Exercise Plan thomas stretch, glute stretch, self patella mobilization; quad set    Consulted and Agree with Plan of Care Patient           Patient will benefit from skilled therapeutic intervention in order to improve the following deficits and impairments:  Abnormal gait, Difficulty walking, Decreased range of motion, Pain, Obesity, Decreased endurance, Decreased strength, Decreased activity tolerance  Visit Diagnosis: Chronic pain of left knee  Chronic pain of right knee  Pain in right hip  Other abnormalities of gait and mobility      PHYSICAL THERAPY DISCHARGE SUMMARY  Visits from Start of Care: 4  Current functional level related to goals / functional outcomes: Improved pain; full HEP for home   Remaining deficits: Mild pain at times   Education / Equipment: HEP   Plan: Patient agrees to discharge.  Patient goals were met. Patient is being discharged due to meeting the stated rehab goals.  ?????      Problem List Patient Active Problem List   Diagnosis Date Noted  . Knee pain 01/12/2020  . Postpartum anemia 01/17/2019  . Normal postpartum course 01/17/2019  . Chronic hypertension complicating or reason for care during pregnancy, third trimester 01/12/2019  .  Threatened preterm labor 12/25/2018  . Threatened preterm labor, third trimester 12/23/2018  . Preterm labor 12/23/2018  . Depressive disorder 12/23/2018  . Anxiety disorder 12/23/2018  . Sicca syndrome ([place] 08/27/2018  . Chronic hypertension affecting pregnancy 07/25/2018  . Pregnant 07/01/2018  . Maternal obesity affecting pregnancy, antepartum 06/11/2018  . Allergy to shellfish 06/11/2018  . Chest pain 04/04/2017  . On continuous oral anticoagulation 04/04/2017  . 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[place] 04/04/2017  . Nonintractable headache 04/04/2017  . Altered thought processes 04/04/2017  . Word finding difficulty 04/04/2017  . Antiphospholipid antibody syndrome ([place] 02/10/2017  . Pulmonary embolus ([place] 02/09/2017  . [place]monary [place]ism ([place] 12/26/2016  . Shortness of breath   . History of pulmonary embolus (PE) 07/24/2016  . Vaginal delivery--VE assist 10/25/2013  . 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 Obesity-BMI 47 05/09/2013  . Hirsutism 10/02/2012  . STD (female) 01/18/2012  . Lupus ([place]   . Asthma   . HSV infection     DCarney LivingPT DPT  06/03/2020, 11:33 AM  [facility]1200 [address]GH[facility] [place] [address]Phone: [phone]  Fax:  [phone] Name: AAven CegielskiMRN: [id number] of Birth: [birth year]

## 2020-06-07 ENCOUNTER — Ambulatory Visit: Payer: Medicaid Other | Admitting: Registered Nurse

## 2020-06-08 ENCOUNTER — Encounter: Payer: Self-pay | Admitting: Registered Nurse

## 2020-06-20 ENCOUNTER — Encounter: Payer: Self-pay | Admitting: Registered Nurse

## 2020-06-20 NOTE — Progress Notes (Signed)
Established Patient Office Visit  Subjective:  Patient ID: Hayley Webb, female    DOB: [birth year]  Age: 34 y.o. MRN: [id number]  CC:  Chief Complaint  Patient presents with  . Transitions Of Care  . Neck Pain    when turning head x 2 weeks  . urinary frequency and pain    x 1week  . Back Pain    shoulder and upper back   . Depression    14 phq 9    HPI Hayley Webb presents for transfer of care. Has a number of concerns today:  Epi pen - hx of anaphylaxis. Needs refill as hers is expired.  Neck pain: with rom. Onset two weeks ago. No acute injury. Still has full rom. No headaches or other neuro symptoms. OTCs not helpful.  Urinary freq: and dysuria. One week. No flank pain. Denies systemic and vaginal symptoms.  Back pain: shoulders and upper back. Question of it is related to neck pain, and if these are related to depression/anxiety  Depression and anxiety: ongoing issue, seems to be worsening again. Denies hi/si but wants to talk about options for treatment. Worse since giving birth to her child - post partum is not something she had experienced before.   Histories reviewed with patient, updated as warranted Last labs reviewed with patient.  Past Medical History:  Diagnosis Date  . Anxiety    doing good now  . Asthma   . Headache(784.0)   . HSV infection   . Hypertension   . Infection    UTI  . Lupus (HCC)    dx age 4  . Pulmonary embolism (HCC) 06/4/82011  . 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 ADENOIDECTOMY    . TONSILLECTOMY AND ADENOIDECTOMY  1998  . WISDOM TOOTH EXTRACTION      Family History  Problem Relation Age of Onset  . Diabetes Maternal Aunt   . Cancer Maternal Grandmother 72       COLON CA  . Diabetes Maternal Grandmother   . Hypertension Maternal Grandfather   . Cancer Maternal Grandfather        prostate  . Asthma Mother   . Asthma Brother   .  Cancer Paternal Grandmother        breast  . Lupus Paternal Grandmother   . Lupus Paternal Aunt   . Stroke Paternal Aunt   . Anesthesia problems Neg Hx   . Hypotension Neg Hx   . Malignant hyperthermia Neg Hx   . Pseudochol deficiency Neg Hx     Social History   Socioeconomic History  . Marital status: Single    Spouse name: Not on file  . Number of children: Not on file  . Years of education: Not on file  . Highest education level: Not on file  Occupational History  . Not on file  Tobacco Use  . Smoking status: Never Smoker  . Smokeless tobacco: Never Used  Vaping Use  . Vaping Use: Never used  Substance and Sexual Activity  . Alcohol use: No  . Drug use: No  . Sexual activity: Yes    Partners: Male    Birth control/protection: None  Other Topics Concern  . Not on file  Social History Narrative  . Not on file   Social Determinants of Health   Financial Resource Strain:   . Difficulty of Paying Living Expenses: Not on file  Food Insecurity:   .  Worried About Programme researcher, broadcasting/film/video in the Last Year: Not on file  . Ran Out of Food in the Last Year: Not on file  Transportation Needs:   . Lack of Transportation (Medical): Not on file  . Lack of Transportation (Non-Medical): Not on file  Physical Activity:   . Days of Exercise per Week: Not on file  . Minutes of Exercise per Session: Not on file  Stress:   . Feeling of Stress : Not on file  Social Connections:   . Frequency of Communication with Friends and Family: Not on file  . Frequency of Social Gatherings with Friends and Family: Not on file  . Attends Religious Services: Not on file  . Active Member of Clubs or Organizations: Not on file  . Attends Banker Meetings: Not on file  . Marital Status: Not on file  Intimate Partner Violence:   . Fear of Current or Ex-Partner: Not on file  . Emotionally Abused: Not on file  . Physically Abused: Not on file  . Sexually Abused: Not on file     Outpatient Medications Prior to Visit  Medication Sig Dispense Refill  . albuterol (PROVENTIL) (2.5 MG/3ML) 0.083% nebulizer solution Take 3 mLs (2.5 mg total) by nebulization every 6 (six) hours as needed for wheezing or shortness of breath. 75 mL 0  . albuterol (VENTOLIN HFA) 108 (90 Base) MCG/ACT inhaler Inhale 2 puffs into the lungs every 6 (six) hours as needed for wheezing or shortness of breath. 18 g 0  . cyclobenzaprine (FLEXERIL) 10 MG tablet Take 10 mg by mouth 3 (three) times daily as needed for muscle spasms.    Marland Kitchen enoxaparin (LOVENOX) 40 MG/0.4ML injection Inject 1.5 mLs (150 mg total) into the skin every 12 (twelve) hours.    . hydroxychloroquine (PLAQUENIL) 200 MG tablet Take 100 mg by mouth 2 (two) times daily.    Marland Kitchen ipratropium-albuterol (DUONEB) 0.5-2.5 (3) MG/3ML SOLN Take 3 mLs by nebulization every 6 (six) hours as needed. 60 mL 0  . labetalol (NORMODYNE) 200 MG tablet Take 2 tablets (400 mg total) by mouth 2 (two) times daily. 60 tablet 0  . methylPREDNISolone (MEDROL PO) Take by mouth.    . methylPREDNISolone (MEDROL) 4 MG tablet Take 1 tablet (4 mg total) by mouth daily. 5 tablet 0  . Multiple Vitamin (MULTIVITAMIN) capsule Take 1 capsule by mouth daily.    . butalbital-acetaminophen-caffeine (FIORICET) 50-325-40 MG tablet Take 2 tablets by mouth every 6 (six) hours as needed for headache. 14 tablet 0  . cholecalciferol (VITAMIN D) 1000 units tablet Take 1,000 Units by mouth daily.     Marland Kitchen doxycycline (VIBRAMYCIN) 100 MG capsule Take 1 capsule (100 mg total) by mouth 2 (two) times daily. 14 capsule 0  . fluconazole (DIFLUCAN) 150 MG tablet Take 1 tablet (150 mg total) by mouth daily. Take second dose 72 hours later if symptoms still persists. 2 tablet 0   No facility-administered medications prior to visit.    Allergies  Allergen Reactions  . Bactrim Anaphylaxis and Hives  . Hydrocodone-Acetaminophen Anaphylaxis  . Nifedipine Swelling and Anaphylaxis    Swelling of  tongue  . Pineapple Itching  . Prednisone Anaphylaxis and Hives    Has been on Dexamethasone without issue.  . Shellfish Allergy Itching  . Sulfamethoxazole-Trimethoprim Other (See Comments)    Complications due to lupus  . Nsaids Other (See Comments)    Has Lupus, reccommended to avoid NSAIDs  . Other Itching  Shrimp:throat itching "can eat other shellfish"  . Vicodin [Hydrocodone-Acetaminophen] Hives and Swelling    Can take Percocet and Tylenol without reaction.    ROS Review of Systems Per hpi     Objective:    Physical Exam Vitals and nursing note reviewed.  Constitutional:      General: She is not in acute distress.    Appearance: Normal appearance. She is normal weight. She is not ill-appearing, toxic-appearing or diaphoretic.  Cardiovascular:     Rate and Rhythm: Normal rate and regular rhythm.     Heart sounds: Normal heart sounds. No murmur heard.  No friction rub. No gallop.   Pulmonary:     Effort: Pulmonary effort is normal. No respiratory distress.     Breath sounds: Normal breath sounds. No stridor. No wheezing, rhonchi or rales.  Chest:     Chest wall: No tenderness.  Musculoskeletal:        General: Tenderness (bilateral trapezius) present. No swelling, deformity or signs of injury. Normal range of motion.     Cervical back: Normal range of motion. No rigidity or tenderness.     Right lower leg: No edema.     Left lower leg: No edema.  Skin:    General: Skin is warm and dry.     Capillary Refill: Capillary refill takes less than 2 seconds.  Neurological:     General: No focal deficit present.     Mental Status: She is alert and oriented to person, place, and time. Mental status is at baseline.  Psychiatric:        Mood and Affect: Mood normal.        Behavior: Behavior normal.        Thought Content: Thought content normal.        Judgment: Judgment normal.     BP (!) 144/92   Pulse 65   Temp 98 F (36.7 C)   Ht 5' 8.5" (1.74 m)   Wt (!)  308 lb (139.7 kg)   LMP 04/10/2020   SpO2 99%   BMI 46.15 kg/m  Wt Readings from Last 3 Encounters:  05/03/20 (!) 308 lb (139.7 kg)  03/16/20 290 lb (131.5 kg)  02/24/20 295 lb (133.8 kg)     Health Maintenance Due  Topic Date Due  . COVID-19 Vaccine (1) Never done  . PAP SMEAR-Modifier  10/25/2019    There are no preventive care reminders to display for this patient.  Lab Results  Component Value Date   TSH 0.958 05/03/2020   Lab Results  Component Value Date   WBC 4.7 05/03/2020   HGB 12.6 05/03/2020   HCT 38.1 05/03/2020   MCV 87 05/03/2020   PLT 266 12/12/2019   Lab Results  Component Value Date   NA 137 05/03/2020   K 4.3 05/03/2020   CO2 22 05/03/2020   GLUCOSE 84 05/03/2020   BUN 10 05/03/2020   CREATININE 0.61 05/03/2020   BILITOT 0.3 05/03/2020   ALKPHOS 98 05/03/2020   AST 15 05/03/2020   ALT 14 05/03/2020   PROT 6.9 05/03/2020   ALBUMIN 4.4 05/03/2020   CALCIUM 9.6 05/03/2020   ANIONGAP 10 12/12/2019   Lab Results  Component Value Date   CHOL 203 (H) 05/03/2020   Lab Results  Component Value Date   HDL 50 05/03/2020   Lab Results  Component Value Date   LDLCALC 142 (H) 05/03/2020   Lab Results  Component Value Date   TRIG 62 05/03/2020  Lab Results  Component Value Date   CHOLHDL 4.1 05/03/2020   Lab Results  Component Value Date   HGBA1C 5.6 05/03/2020      Assessment & Plan:   Problem List Items Addressed This Visit    None    Visit Diagnoses    Urinary pain    -  Primary   Relevant Orders   POCT urinalysis dipstick (Completed)   Encounter for immunization       Relevant Orders   Flu Vaccine QUAD 6+ mos PF IM (Fluarix Quad PF) (Completed)   History of anaphylaxis       Relevant Medications   EPINEPHrine 0.1 MG/0.1ML SOAJ   Microscopic hematuria       Relevant Orders   Urine Culture (Completed)   Screening for endocrine, metabolic and immunity disorder       Relevant Orders   Comprehensive metabolic panel  (Completed)   CBC With Differential (Completed)   TSH (Completed)   POCT glycosylated hemoglobin (Hb A1C) (Completed)   Lipid screening       Relevant Orders   Lipid panel (Completed)   Upper back pain       Relevant Medications   methocarbamol (ROBAXIN) 500 MG tablet   Postpartum depression       Relevant Medications   sertraline (ZOLOFT) 50 MG tablet      Meds ordered this encounter  Medications  . EPINEPHrine 0.1 MG/0.1ML SOAJ    Sig: Inject 1 Dose as directed as needed (for anaphylaxis).    Dispense:  2 each    Refill:  2    Order Specific Question:   Supervising Provider    Answer:   Neva Seat, JEFFREY R [[id number]]  . sertraline (ZOLOFT) 50 MG tablet    Sig: Take 1 tablet (50 mg total) by mouth daily. Take 1/2 tablet (25mg ) for first 8 days. Then increase to 1 tablet (50mg )    Dispense:  90 tablet    Refill:  0    Order Specific Question:   Supervising Provider    Answer:   , JEFFREY R [[id number]]  . methocarbamol (ROBAXIN) 500 MG tablet    Sig: Take 1 tablet (500 mg total) by mouth 4 (four) times daily.    Dispense:  60 tablet    Refill:  0    Order Specific Question:   Supervising Provider    Answer:   , JEFFREY R [[id number]]    Follow-up: No follow-ups on file.   PLAN  Refill epi pen  Start sertraline 25mg  PO qd and increase to 50mg  after 1-2 weeks. Med check in 4-6 weeks  Robaxin for upper back and neck pain  POCT UA does not show UTI but will send culture. Monitor symptoms closely.  Labs collected. Will follow up with the patient as warranted.  Patient encouraged to call clinic with any questions, comments, or concerns.  Neva Seat, NP

## 2020-07-05 DIAGNOSIS — Z7901 Long term (current) use of anticoagulants: Secondary | ICD-10-CM | POA: Diagnosis not present

## 2020-07-05 DIAGNOSIS — Z6841 Body Mass Index (BMI) 40.0 and over, adult: Secondary | ICD-10-CM | POA: Diagnosis not present

## 2020-07-05 DIAGNOSIS — I10 Essential (primary) hypertension: Secondary | ICD-10-CM | POA: Diagnosis not present

## 2020-07-05 DIAGNOSIS — M329 Systemic lupus erythematosus, unspecified: Secondary | ICD-10-CM | POA: Diagnosis not present

## 2020-07-05 DIAGNOSIS — Z86711 Personal history of pulmonary embolism: Secondary | ICD-10-CM | POA: Diagnosis not present

## 2020-07-07 DIAGNOSIS — Z8616 Personal history of COVID-19: Secondary | ICD-10-CM | POA: Diagnosis not present

## 2020-07-07 DIAGNOSIS — Z86711 Personal history of pulmonary embolism: Secondary | ICD-10-CM | POA: Diagnosis not present

## 2020-07-07 DIAGNOSIS — Z7901 Long term (current) use of anticoagulants: Secondary | ICD-10-CM | POA: Diagnosis not present

## 2020-07-07 DIAGNOSIS — M329 Systemic lupus erythematosus, unspecified: Secondary | ICD-10-CM | POA: Diagnosis not present

## 2020-07-25 ENCOUNTER — Other Ambulatory Visit: Payer: Self-pay | Admitting: Registered Nurse

## 2020-07-25 DIAGNOSIS — F53 Postpartum depression: Secondary | ICD-10-CM

## 2020-07-25 DIAGNOSIS — O99345 Other mental disorders complicating the puerperium: Secondary | ICD-10-CM

## 2020-08-05 DIAGNOSIS — Z124 Encounter for screening for malignant neoplasm of cervix: Secondary | ICD-10-CM | POA: Diagnosis not present

## 2020-08-05 DIAGNOSIS — B373 Candidiasis of vulva and vagina: Secondary | ICD-10-CM | POA: Diagnosis not present

## 2020-08-05 DIAGNOSIS — Z01419 Encounter for gynecological examination (general) (routine) without abnormal findings: Secondary | ICD-10-CM | POA: Diagnosis not present

## 2020-08-05 DIAGNOSIS — I2699 Other pulmonary embolism without acute cor pulmonale: Secondary | ICD-10-CM | POA: Diagnosis not present

## 2020-08-05 DIAGNOSIS — Z0000 Encounter for general adult medical examination without abnormal findings: Secondary | ICD-10-CM | POA: Diagnosis not present

## 2020-08-05 DIAGNOSIS — Z3009 Encounter for other general counseling and advice on contraception: Secondary | ICD-10-CM | POA: Diagnosis not present

## 2020-08-05 DIAGNOSIS — M329 Systemic lupus erythematosus, unspecified: Secondary | ICD-10-CM | POA: Diagnosis not present

## 2020-08-05 DIAGNOSIS — L293 Anogenital pruritus, unspecified: Secondary | ICD-10-CM | POA: Diagnosis not present

## 2020-08-05 DIAGNOSIS — Z6841 Body Mass Index (BMI) 40.0 and over, adult: Secondary | ICD-10-CM | POA: Diagnosis not present

## 2020-08-11 DIAGNOSIS — Z713 Dietary counseling and surveillance: Secondary | ICD-10-CM | POA: Diagnosis not present

## 2020-08-18 DIAGNOSIS — M329 Systemic lupus erythematosus, unspecified: Secondary | ICD-10-CM | POA: Diagnosis not present

## 2020-08-18 DIAGNOSIS — Z86711 Personal history of pulmonary embolism: Secondary | ICD-10-CM | POA: Diagnosis not present

## 2020-08-18 DIAGNOSIS — Z01818 Encounter for other preprocedural examination: Secondary | ICD-10-CM | POA: Diagnosis not present

## 2020-08-18 DIAGNOSIS — Z7901 Long term (current) use of anticoagulants: Secondary | ICD-10-CM | POA: Diagnosis not present

## 2020-08-18 DIAGNOSIS — I10 Essential (primary) hypertension: Secondary | ICD-10-CM | POA: Diagnosis not present

## 2020-08-18 DIAGNOSIS — E782 Mixed hyperlipidemia: Secondary | ICD-10-CM | POA: Diagnosis not present

## 2020-10-12 ENCOUNTER — Ambulatory Visit (INDEPENDENT_AMBULATORY_CARE_PROVIDER_SITE_OTHER): Payer: Medicaid Other | Admitting: Sports Medicine

## 2020-10-12 ENCOUNTER — Other Ambulatory Visit: Payer: Self-pay

## 2020-10-12 VITALS — BP 138/93 | Ht 68.5 in | Wt 306.0 lb

## 2020-10-12 DIAGNOSIS — M25561 Pain in right knee: Secondary | ICD-10-CM | POA: Diagnosis not present

## 2020-10-12 HISTORY — DX: Pain in right knee: M25.561

## 2020-10-12 NOTE — Patient Instructions (Signed)
It was great to meet you today! Thank you for letting me participate in your care!  Today, we discussed your right knee pain which is due to patellar tendonitis. Please take Meloxicam as directed every day for the next 14 days then as needed for pain. You can also use over the counter Voltaren Gel and Tylenol for pain. Please do the exercises and you can use ice and an ace wrap after activity.  You can try a patellar strap for comfort. You will only need to wear it when active. I will see you back in 4 weeks to ensure you are getting better. If your pain worsens you can come back sooner.    Be well, Jules Schick, DO PGY-4, Sports Medicine Fellow [facility]

## 2020-10-12 NOTE — Assessment & Plan Note (Addendum)
Given history, presentation, physical exam, and ultrasound findings she most likely has some sort of patellar tendinitis.  Although it is somewhat uncommon to have this much pain with patellar tendinitis as well as an difficulty bearing weight.  For that reason we will see her back in 1 week to make sure that this is better with her treatment.  Originally was going to give meloxicam but due to her history of autoimmune disease will avoid oral NSAIDs and give Voltaren gel instead.  She can also take Tylenol.  We will also give her an Ace wrap to provide compression and crutches for support. If not improved when I see her next week we will begin with x-rays for further work-up.  I did consider this could be due to autoimmune disease however no other joints are involved and she has no other symptoms currently.

## 2020-10-12 NOTE — Progress Notes (Signed)
SUBJECTIVE:   CHIEF COMPLAINT / HPI:   Right Knee Pain Hayley Webb is a very pleasant 35 year old female who is an established patient at our practice coming in with new onset right knee pain.  Is been ongoing for the past several days with no known inciting event, trauma, or recent fall or known injury.  She did go and start a new exercise program walking for about an hour each day beginning the previous Monday.  Over the past few days she has had slowly progressively worsening knee pain in the anterior of her right knee.  Worse with activities especially bearing weight.  As she comes in today she can barely tolerate putting any weight and comes in in a wheelchair.  She denies any fever, chills, or any significant skin changes or swelling over the right knee.  She can move it but it is extremely painful.  She has history of left knee arthritis but nothing in the right and this is never hurt her before.  PERTINENT  PMH / PSH: History of PE, asthma, sicca syndrome, lupus, anxiety, depression, hypertension  OBJECTIVE:   BP (!) 138/93   Ht 5' 8.5" (1.74 m)   Wt (!) 306 lb (138.8 kg)   BMI 45.85 kg/m   No flowsheet data found.  Knee, Right: Inspection was negative for erythema, ecchymosis, and positive for slight swelling. No obvious bony abnormalities or signs of osteophyte development. Palpation yielded no asymmetric warmth; No joint line tenderness; No condyle tenderness; Positive patellar insertional tenderness; No patellar crepitus. ROM reduced actively due to pain but normal in flexion (105 degrees) and extension (0 degrees). Normal hamstring and quadriceps strength. Neurovascularly intact bilaterally. Special Tests  - Cruciate Ligaments:   - Anterior Drawer:  NEG - Posterior Drawer: NEG   - Lachman:  Unable to perform  - Collateral Ligaments:   - Varus/Valgus Stress test: NEG  - Meniscus:   - Thessaly: NEG   - McMurray's: NEG  - Patella:   - Patellar grind/compression:  NEG   - Patellar glide: Without apprehension  Limited MSK U/S: Right Knee No suprapatellar pouch effusion seen.  Quad tendon intact.  Patellar tendon has hypoechoic changes and thickening at the insertion on the tibia.  No tears or evidence of partial tearing of the patellar tendon. Impression: Insertional patellar tendinitis   ASSESSMENT/PLAN:   Right knee pain Given history, presentation, physical exam, and ultrasound findings she most likely has some sort of patellar tendinitis.  Although it is somewhat uncommon to have this much pain with patellar tendinitis as well as an difficulty bearing weight.  For that reason we will see her back in 1 week to make sure that this is better with her treatment.  Originally was going to give meloxicam but due to her history of autoimmune disease will avoid oral NSAIDs and give Voltaren gel instead.  She can also take Tylenol.  We will also give her an Ace wrap to provide compression and crutches for support. If not improved when I see her next week we will begin with x-rays for further work-up.  I did consider this could be due to autoimmune disease however no other joints are involved and she has no other symptoms currently.     Arlyce Harman, DO PGY-4, Sports Medicine Fellow [facility]  Patient seen and evaluated with the sports medicine fellow.  I agree with the above plan of care.  Treatment as above and follow-up in 1 week.  If symptoms persist at follow-up, consider further diagnostic imaging.

## 2020-10-13 ENCOUNTER — Encounter: Payer: Self-pay | Admitting: Sports Medicine

## 2020-10-14 DIAGNOSIS — Z713 Dietary counseling and surveillance: Secondary | ICD-10-CM | POA: Diagnosis not present

## 2020-10-18 DIAGNOSIS — M329 Systemic lupus erythematosus, unspecified: Secondary | ICD-10-CM | POA: Diagnosis not present

## 2020-10-18 DIAGNOSIS — E669 Obesity, unspecified: Secondary | ICD-10-CM | POA: Diagnosis not present

## 2020-10-18 DIAGNOSIS — Z6841 Body Mass Index (BMI) 40.0 and over, adult: Secondary | ICD-10-CM | POA: Diagnosis not present

## 2020-10-18 DIAGNOSIS — I10 Essential (primary) hypertension: Secondary | ICD-10-CM | POA: Diagnosis not present

## 2020-10-20 ENCOUNTER — Ambulatory Visit: Payer: Medicaid Other | Admitting: Family Medicine

## 2020-10-20 ENCOUNTER — Other Ambulatory Visit: Payer: Self-pay

## 2020-10-20 DIAGNOSIS — M25562 Pain in left knee: Secondary | ICD-10-CM

## 2020-10-20 HISTORY — DX: Pain in left knee: M25.562

## 2020-10-20 MED ORDER — METHYLPREDNISOLONE ACETATE 40 MG/ML IJ SUSP
40.0000 mg | Freq: Once | INTRAMUSCULAR | Status: AC
  Administered 2020-10-20: 40 mg via INTRA_ARTICULAR

## 2020-10-20 NOTE — Assessment & Plan Note (Signed)
Left knee pain after having subsequent right knee pain due to patellar tendinitis and not been able to bear weight on the right leg.  I think her of left knee pain is a combination of degenerative disease, Weight, as well as having to do more weightbearing activity with her recent right knee injury.  Fortunately her right knee is now doing better and we will proceed with a cortisone injection to the left knee to calm things down.  Injection noted below. - Continue with home exercises For both knees - Voltaren as needed for pain and inflammation - Follow-up as needed

## 2020-10-20 NOTE — Progress Notes (Addendum)
SUBJECTIVE:   CHIEF COMPLAINT / HPI:   Right Knee F/u Hayley Webb presents today for follow-up due to right knee pain for which she was seen last week and diagnosed with patellar tendinitis.  She has been using the Voltaren gel using ice, relative rest, elevation, and doing the exercises and states her pain is much better.  Not completely gone but she can now walk on it bear weight without any extreme pain or difficulty.  It sometimes hurts and is still sometimes a little bit tender but overall much improved.  Left knee pain Unfortunately she developed left knee pain after her right knee began to get better.  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  She presents with about 1 week of nonradiating pain mostly over the lateral side of the knee.  Sometimes it does feel like it is catching but never locks.  Hurts with activity and better with rest.  She has been using Voltaren gel and ice on this as well and that does help.  PERTINENT  PMH / PSH: Lupus, history of PE, asthma, sicca syndrome, hypertension, anxiety, depression  OBJECTIVE:   BP 121/85   Ht 5' 8.5" (1.74 m)   Wt (!) 309 lb (140.2 kg)   BMI 46.30 kg/m   No flowsheet data found.  Knee, Left: Inspection was negative for erythema, ecchymosis, and effusion. No obvious bony abnormalities or signs of osteophyte development. Palpation yielded no asymmetric warmth; Mild joint line tenderness; No condyle tenderness; No patellar tenderness; Positive for patellar crepitus. Patellar and quadriceps tendons unremarkable, and no tenderness of the pes anserine bursa. ROM normal in flexion (135 degrees) and extension (0 degrees) but pain with deep flexion. Normal hamstring and quadriceps strength. Neurovascularly intact  bilaterally. Special Tests  - Cruciate Ligaments:   - Anterior Drawer:  NEG - Posterior Drawer: NEG  - Collateral Ligaments:   - Varus/Valgus Stress test: NEG  - Meniscus   - McMurray's: NEG  - Patella:   - Patellar grind/compression: Equivocal   - Patellar glide: Without apprehension  ASSESSMENT/PLAN:   Left knee pain Left knee pain after having subsequent right knee pain due to patellar tendinitis and not been able to bear weight on the right leg.  I think her of left knee pain is a combination of degenerative disease, Weight, as well as having to do more weightbearing activity with her recent right knee injury.  Fortunately her right knee is now doing better and we will proceed with a cortisone injection to the left knee to calm things down.  Injection noted below. - Continue with home exercises For both knees - Voltaren as needed for pain and inflammation - Follow-up as needed  Procedure Written and verbal consent was obtained after discussing the risks and benefits of the procedure with the patient. A timeout was performed and the correct site and side was identified and confirmed.  The left knee was cleaned in sterile fashion betadine and alcohol pad. 1cc of 40 mg Depo-medrol and 3 cc 1% Lidocaine was injected using a anteriomedial approach using a 5 cc syringe and 25 gauge 1 and 1/2 in needle. No complications were encountered. Minimal blood loss. A band aid was applied.     Arlyce Harman, DO PGY-4, Sports Medicine Fellow [facility]  Addendum:  I was the preceptor for this visit and available for immediate consultation.  Norton Blizzard MD Marrianne Mood

## 2020-11-21 IMAGING — US US MFM OB FOLLOW UP
1 series · 13 of 27 positions shown · non-contrast
Comparison: none

[Series 1: us mfm ob follow up · 13 of 27 slices shown]
[im 2/27]
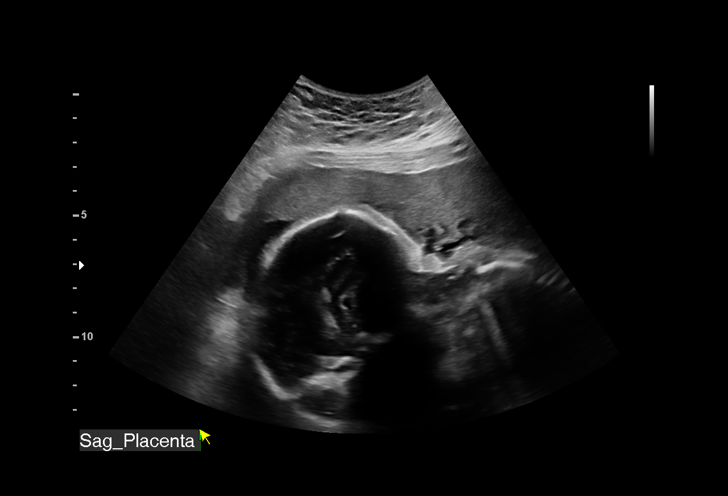
[im 4/27]
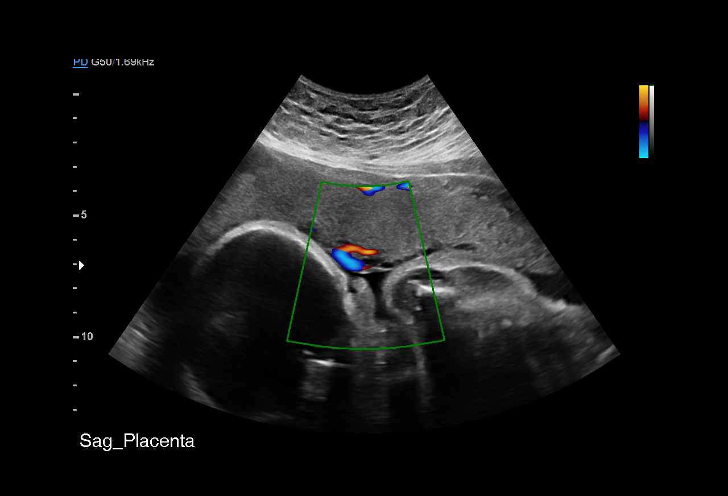
[im 6/27]
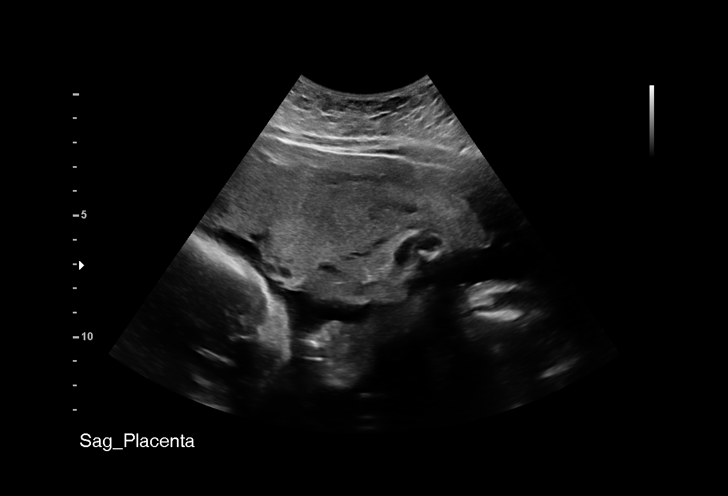
[im 8/27]
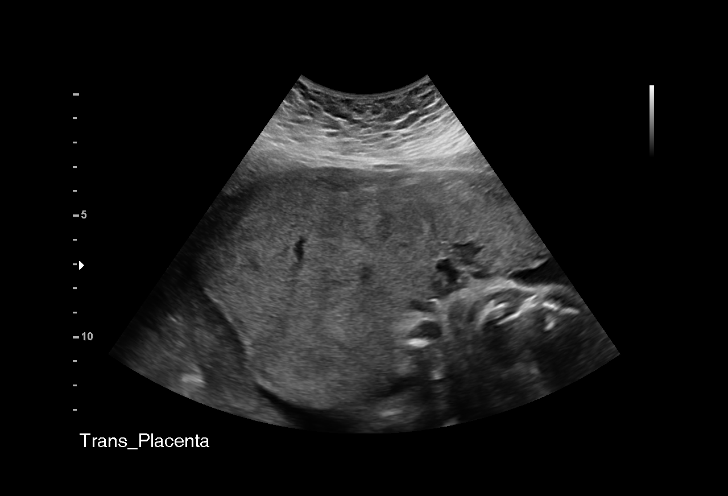
[im 10/27]
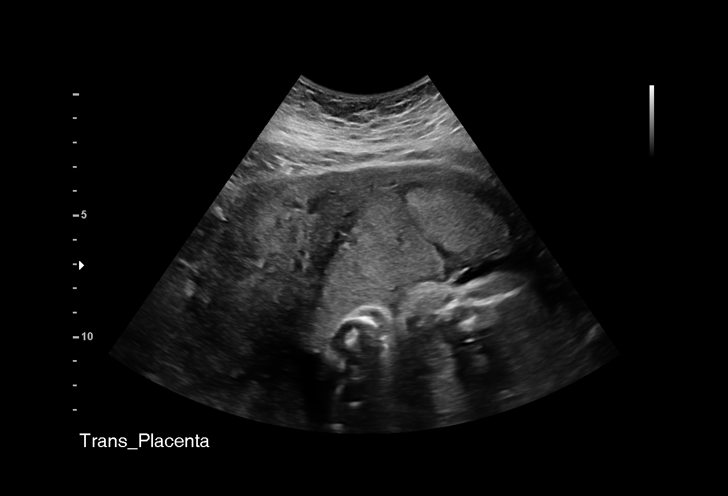
[im 12/27]
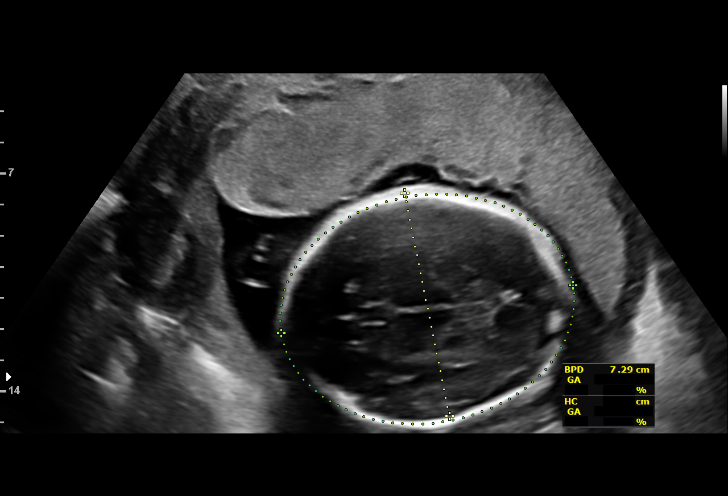
[im 14/27]
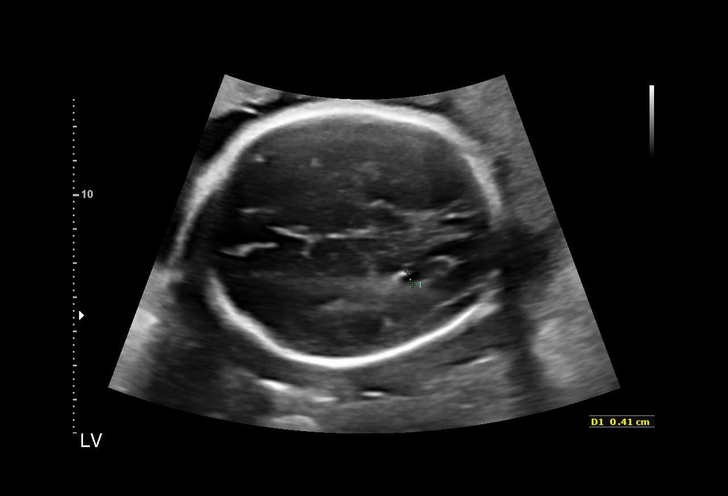
[im 16/27]
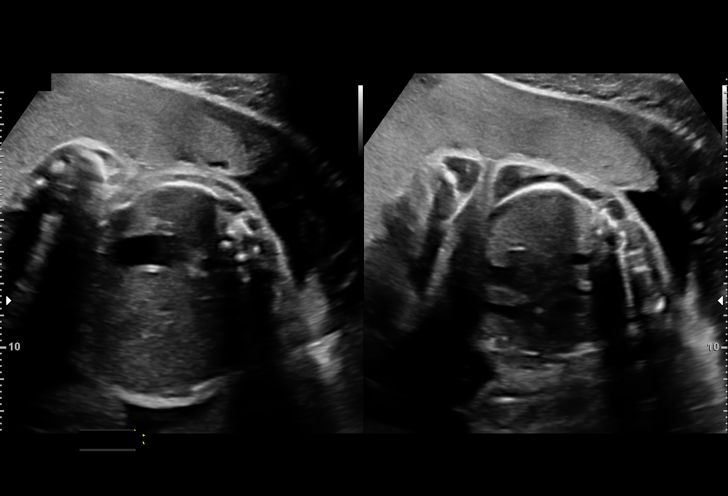
[im 18/27]
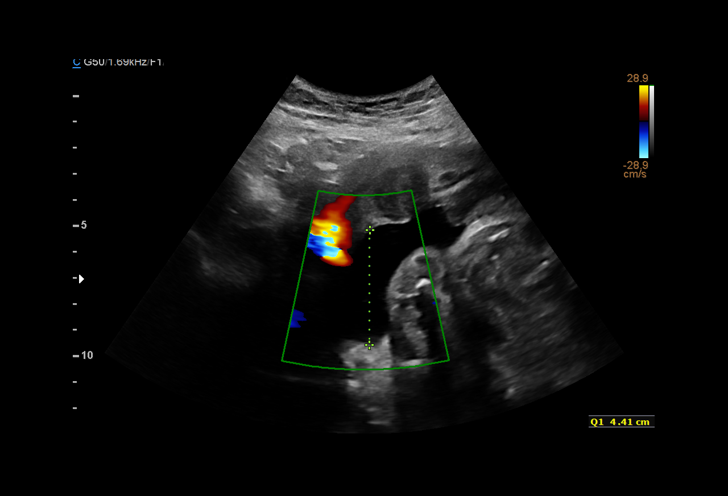
[im 20/27]
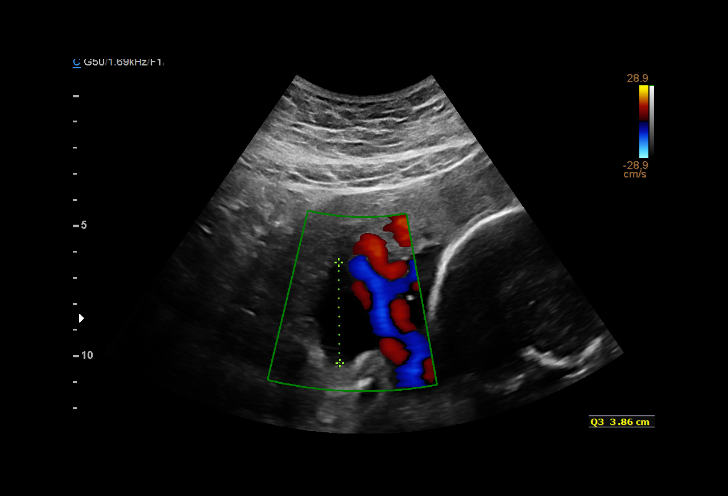
[im 22/27]
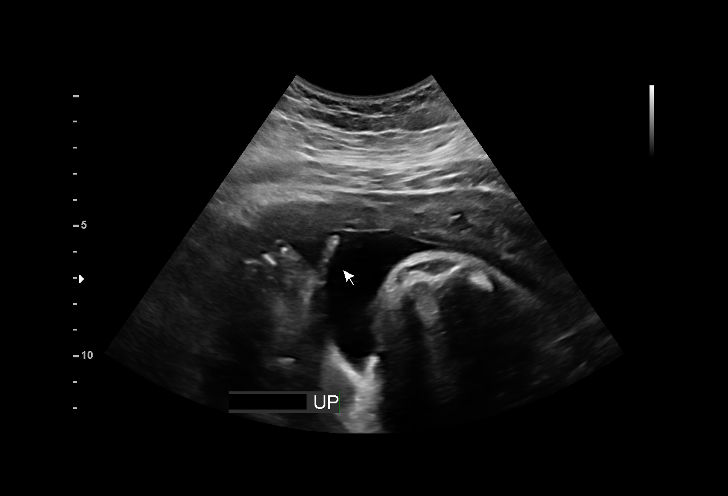
[im 24/27]
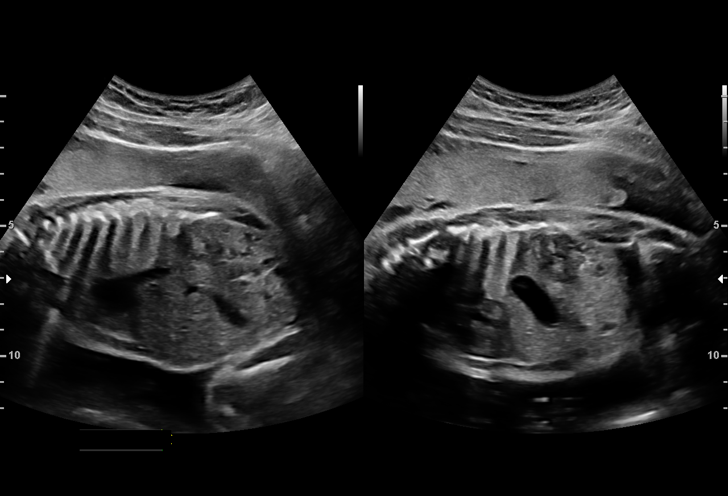
[im 26/27]
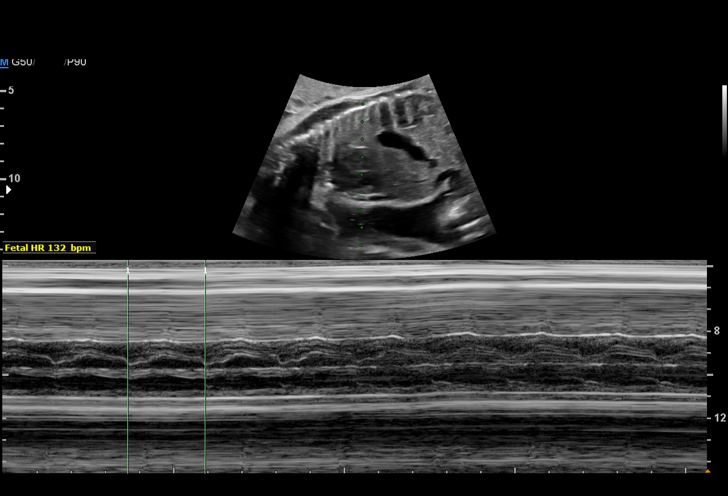

[13 of 27 positions shown; findings below may reference images not displayed]

Obstetrics &
                                                             Gynecology
                                                             [id number]
                                                             [place]

 ----------------------------------------------------------------------

 ----------------------------------------------------------------------
Indications

  28 weeks gestation of pregnancy
  Encounter for antenatal screening for
  malformations(insufficient fetal DNA x2 Nips,
  AFP neg)
  Maternal morbid obesity
  Systemic lupus complicating pregnancy,         O26.892,
  second trimester
  Medical complication of pregnancy
  (recurrent PE, on Lovenox)
  Hypertension - Chronic/Pre-existing
  (labetalol)
  Previous pregnacy with congenital heart
  (cardiac) defect (heart block) (fetal echo NL)
 ----------------------------------------------------------------------
Vital Signs

                                                Height:        5'8"
Fetal Evaluation

 Num Of Fetuses:          1
 Fetal Heart Rate(bpm):   132
 Cardiac Activity:        Observed
 Presentation:            Breech
 Placenta:                Anterior
 P. Cord Insertion:       Visualized, central
 Amniotic Fluid
 AFI FV:      Within normal limits

 AFI Sum(cm)     %Tile       Largest Pocket(cm)
 12.61           33

 RUQ(cm)       RLQ(cm)       LUQ(cm)        LLQ(cm)
 4.41          4.34          0
Biometry

 BPD:      72.9  mm     G. Age:  29w 2d         60  %    CI:        75.57   %    70 - 86
                                                         FL/HC:       17.8  %    19.6 -
 HC:      265.9  mm     G. Age:  29w 0d         30  %    HC/AC:       1.13       0.99 -
 AC:      234.5  mm     G. Age:  27w 5d         21  %    FL/BPD:      64.9  %    71 - 87
 FL:       47.3  mm     G. Age:  25w 6d        < 3  %    FL/AC:       20.2  %    20 - 24

 Est. FW:    9188   gm     2 lb 5 oz     26  %
OB History

 Gravidity:    3         Term:   1        Prem:   0        SAB:   1
 TOP:          0       Ectopic:  0        Living: 1
Gestational Age

 LMP:           31w 0d        Date:  04/18/18                 EDD:   01/23/19
 U/S Today:     28w 0d                                        EDD:   02/13/19
 Best:          28w 4d     Det. By:  Early Ultrasound         EDD:   02/09/19
                                     (06/26/18)
Anatomy



 Other:  Fetus appears to be a male. Technically difficult due to fetal position.
Cervix Uterus Adnexa

 Cervix
 Not visualized (advanced GA >02wks)
Impression

 Normal interval growth.
Recommendations

 Continue weekly testing
 Repeat growth in 4 weeks.

## 2020-12-19 IMAGING — US US MFM OB FOLLOW UP
1 series · 13 of 28 positions shown · non-contrast
Comparison: none

[Series 1: us mfm ob follow up · 13 of 50 slices shown]
[im 2/50]
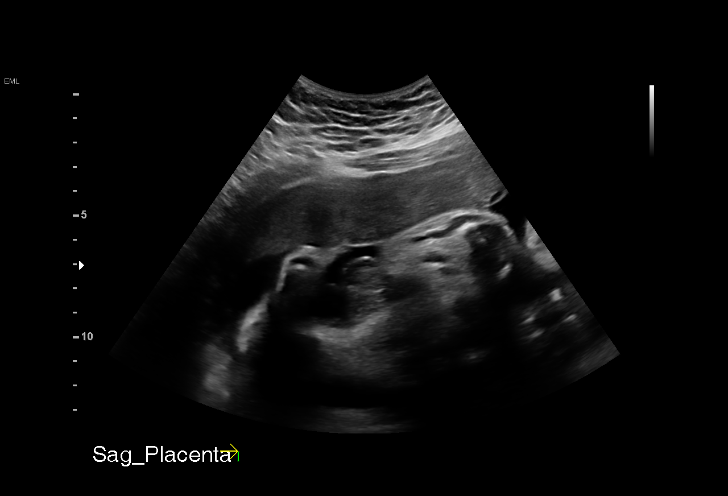
[im 6/50]
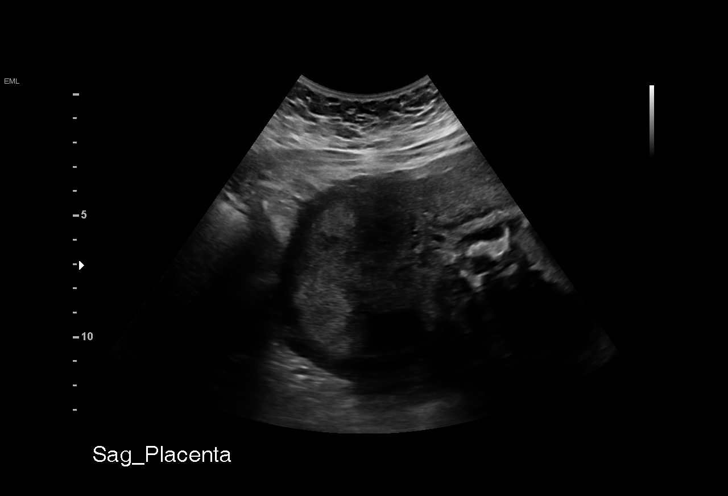
[im 10/50]
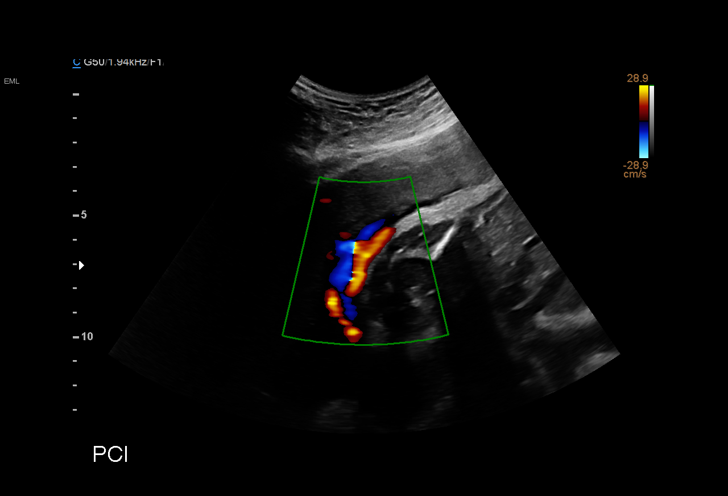
[im 13/50]
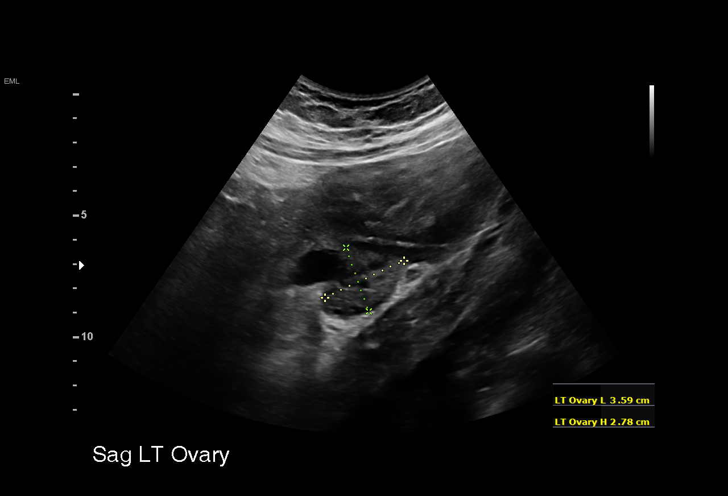
[im 17/50]
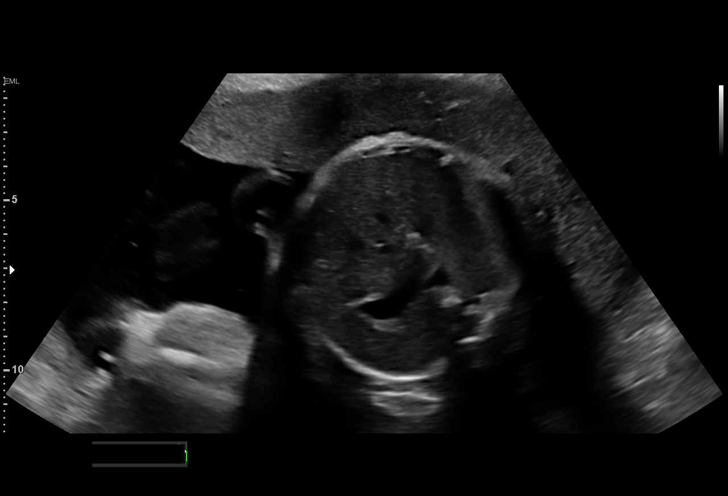
[im 20/50]
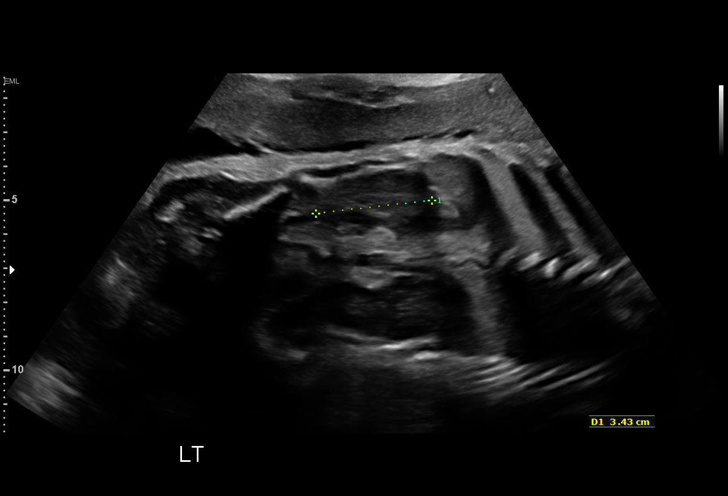
[im 26/50]
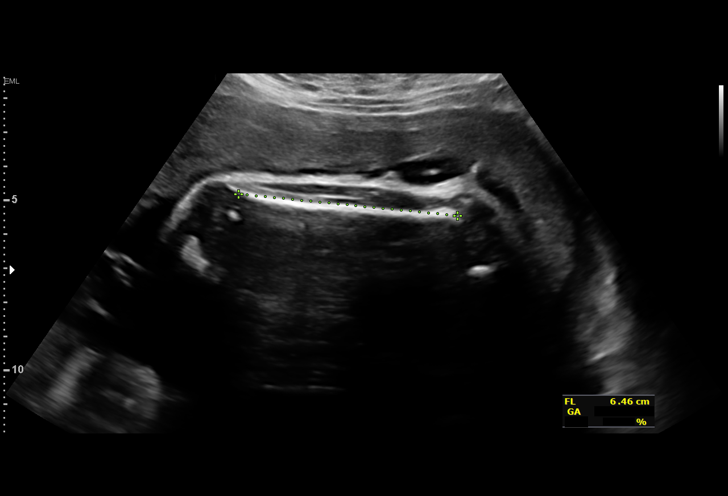
[im 30/50]
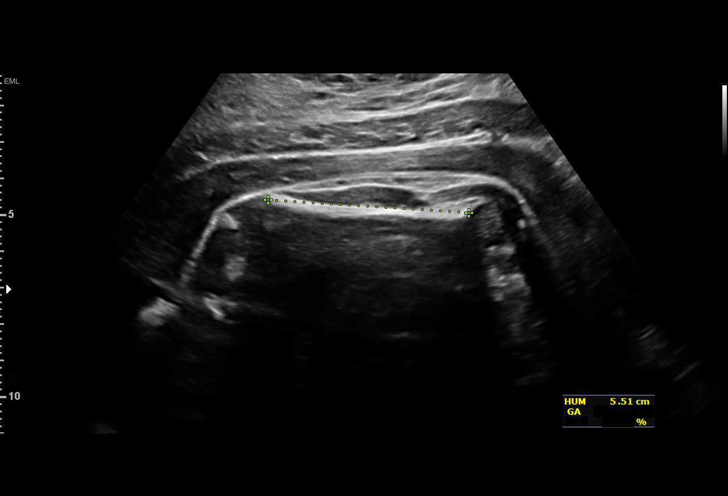
[im 33/50]
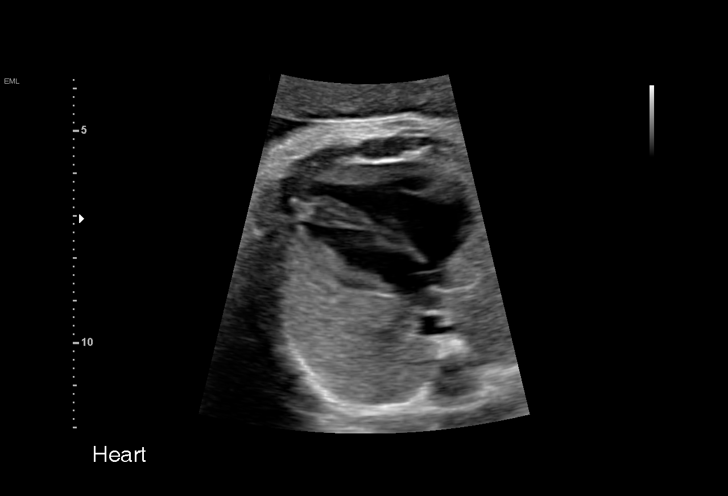
[im 37/50]
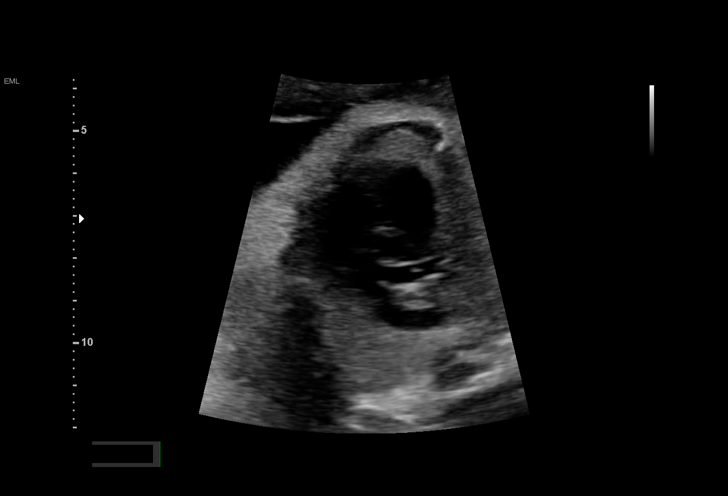
[im 40/50]
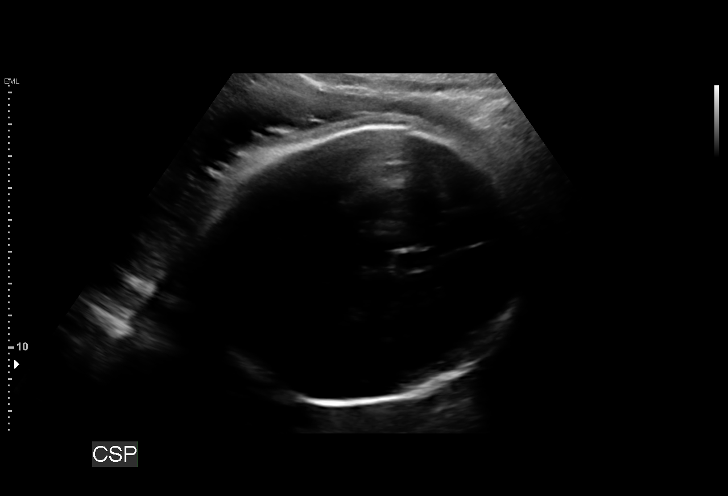
[im 44/50]
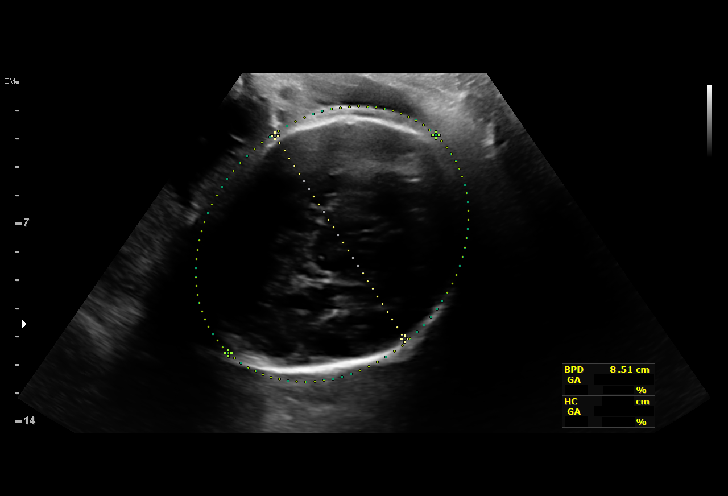
[im 48/50]
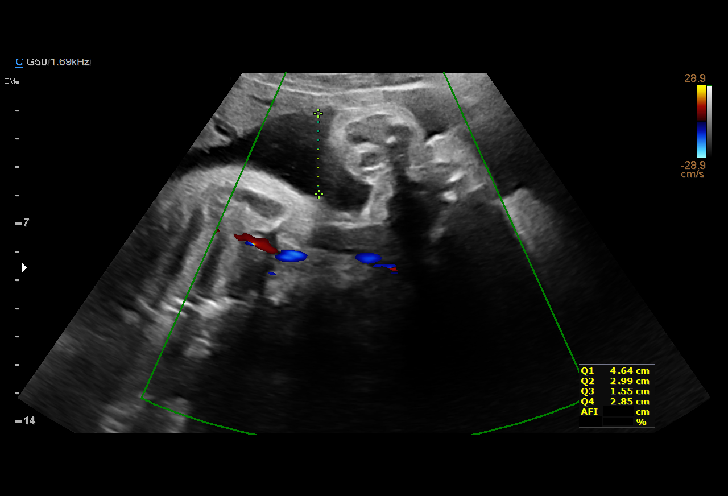

[13 of 28 positions shown; findings below may reference images not displayed]

Obstetrics &
                                                             Gynecology
                                                             [address]
                                                             [place].

                                                       [place]
 ----------------------------------------------------------------------

 ----------------------------------------------------------------------
Indications

  Encounter for antenatal screening for
  malformations(insufficient fetal DNA x2 Nips,
  AFP neg)
  Maternal morbid obesity
  Systemic lupus complicating pregnancy,         O26.892,
  second trimester
  Medical complication of pregnancy
  (recurrent PE, on Lovenox)
  Hypertension - Chronic/Pre-existing
  (labetalol)
  Previous pregnacy with congenital heart
  (cardiac) defect (heart block) (fetal echo NL)
  32 weeks gestation of pregnancy
 ----------------------------------------------------------------------
Vital Signs

 BMI:
Fetal Evaluation

 Num Of Fetuses:          1
 Fetal Heart Rate(bpm):   136
 Cardiac Activity:        Observed
 Presentation:            Cephalic
 Placenta:                Posterior
 P. Cord Insertion:       Not well visualized
 Amniotic Fluid
 AFI FV:      Within normal limits

 AFI Sum(cm)     %Tile       Largest Pocket(cm)
 12.03           32

 RUQ(cm)       RLQ(cm)       LUQ(cm)        LLQ(cm)

Biometry

 BPD:      85.1  mm     G. Age:  34w 2d         87  %    CI:        78.27   %    70 - 86
                                                         FL/HC:       20.3  %    19.9 -
 HC:      304.3  mm     G. Age:  33w 6d         47  %    HC/AC:       1.11       0.96 -
 AC:      274.6  mm     G. Age:  31w 4d         22  %    FL/BPD:      72.5  %    71 - 87
 FL:       61.7  mm     G. Age:  32w 0d         23  %    FL/AC:       22.5  %    20 - 24
 HUM:      55.6  mm     G. Age:  32w 3d         50  %

 LV:        5.6  mm

 Est. FW:    7818   gm     4 lb 3 oz     49  %
OB History

 Gravidity:    3         Term:   1        Prem:   0        SAB:   1
 TOP:          0       Ectopic:  0        Living: 1
Gestational Age

 LMP:           35w 0d        Date:  04/18/18                 EDD:   01/23/19
 U/S Today:     33w 0d                                        EDD:   02/06/19
 Best:          32w 4d     Det. By:  Early Ultrasound         EDD:   02/09/19
                                     (06/26/18)
Anatomy

 Cranium:               Appears normal         LVOT:                   Appears normal
 Cavum:                 Appears normal         Aortic Arch:            Previously seen
 Ventricles:            Appears normal         Ductal Arch:            Previously seen
 Choroid Plexus:        Previously seen        Diaphragm:              Appears normal
 Cerebellum:            Previously seen        Stomach:                Appears normal, left
                                                                       sided
 Posterior Fossa:       Previously seen        Abdomen:                Appears normal
 Nuchal Fold:           Not applicable (>20    Abdominal Wall:         Appears nml (cord
                        wks GA)                                        insert, abd wall)
 Face:                  Appears normal         Cord Vessels:           Appears normal (3
                        (orbits and profile)                           vessel cord)
 Lips:                  Previously seen        Kidneys:                Appear normal
 Palate:                Previously seen        Bladder:                Appears normal
 Thoracic:              Appears normal         Spine:                  Previously seen
 Heart:                 Appears normal         Upper Extremities:      Previously seen
                        (4CH, axis, and
                        situs)
 RVOT:                  Appears normal         Lower Extremities:      Previously seen

 Other:  Normal genaitalia previously visualized. Nasal bone previously seen.
         Heels and 5th digit previously visualized.
Cervix Uterus Adnexa
 Cervix
 Not visualized (advanced GA >66wks)

 Left Ovary
 Within normal limits.

 Right Ovary
 Within normal limits.

 Adnexa
 No abnormality visualized.
Impression

 Normal interval growth
 SLE
 Chronic hypertension on meds
Recommendations

 Follow up growth in 4 weeks.
 Initiate weekly testing.

## 2021-06-24 DIAGNOSIS — R059 Cough, unspecified: Secondary | ICD-10-CM | POA: Diagnosis not present

## 2021-06-24 DIAGNOSIS — J0100 Acute maxillary sinusitis, unspecified: Secondary | ICD-10-CM | POA: Diagnosis not present

## 2021-06-24 DIAGNOSIS — Z20822 Contact with and (suspected) exposure to covid-19: Secondary | ICD-10-CM | POA: Diagnosis not present

## 2021-06-24 DIAGNOSIS — H66002 Acute suppurative otitis media without spontaneous rupture of ear drum, left ear: Secondary | ICD-10-CM | POA: Diagnosis not present

## 2021-06-29 DIAGNOSIS — Z20822 Contact with and (suspected) exposure to covid-19: Secondary | ICD-10-CM | POA: Diagnosis not present

## 2021-06-29 DIAGNOSIS — R0602 Shortness of breath: Secondary | ICD-10-CM | POA: Diagnosis not present

## 2021-06-29 DIAGNOSIS — R918 Other nonspecific abnormal finding of lung field: Secondary | ICD-10-CM | POA: Diagnosis not present

## 2021-06-29 DIAGNOSIS — J029 Acute pharyngitis, unspecified: Secondary | ICD-10-CM | POA: Diagnosis not present

## 2021-06-29 DIAGNOSIS — R051 Acute cough: Secondary | ICD-10-CM | POA: Diagnosis not present

## 2021-06-29 DIAGNOSIS — R0981 Nasal congestion: Secondary | ICD-10-CM | POA: Diagnosis not present

## 2021-06-29 DIAGNOSIS — M791 Myalgia, unspecified site: Secondary | ICD-10-CM | POA: Diagnosis not present

## 2021-07-19 DIAGNOSIS — N912 Amenorrhea, unspecified: Secondary | ICD-10-CM | POA: Diagnosis not present

## 2021-07-19 DIAGNOSIS — N925 Other specified irregular menstruation: Secondary | ICD-10-CM | POA: Diagnosis not present

## 2021-07-19 DIAGNOSIS — O3680X9 Pregnancy with inconclusive fetal viability, other fetus: Secondary | ICD-10-CM | POA: Diagnosis not present

## 2021-07-19 DIAGNOSIS — Z3A01 Less than 8 weeks gestation of pregnancy: Secondary | ICD-10-CM | POA: Diagnosis not present

## 2021-07-24 DIAGNOSIS — U071 COVID-19: Secondary | ICD-10-CM

## 2021-07-24 HISTORY — DX: COVID-19: U07.1

## 2021-07-24 NOTE — L&D Delivery Note (Signed)
Delivery Note Arrived to room to evaluate patient for delivery. Patient was found to be complete and +2 station. Fetal head was delivered over intact perineum.No No nuchal was present. Shoulders and body followed without difficulty. Infant was placed on mother's abdomen, mouth and nose were bulb suctioned and let out a vigorous cry. Delayed cord clamping was performed for 60 seconds. Infant was passed to awaiting NICU team. Venous cord blood was collected. Placenta delivered spontaneously. Cervix, vagina, perineum and labia were inspected, no tears were found. Bimanual massage performed for moderate bleeding after placenta delivered. Uterine fundus was firm. Cytotec was given per rectum for prophylaxis. Placenta was inspected, found to be intact with a 3 vessel cord. Counts were correct.   At 5:50 PM a viable and healthy female was delivered via Vaginal, Spontaneous (Presentation: Right Occiput Anterior).  APGAR: 9,9 ; weight 4 lb 4.8 oz (1950 g).   Placenta status: Spontaneous, Intact.  Cord: 3 vessels with the following complications: None.  Cord pH: not collected  Anesthesia: Epidural Episiotomy: None Lacerations: None Suture Repair:  N/A Est. Blood Loss (mL): 200  Mom to postpartum.  Baby to NICU.  Steva Ready 01/28/2022, 6:19 PM

## 2021-07-26 ENCOUNTER — Telehealth: Payer: Self-pay | Admitting: Registered Nurse

## 2021-07-26 NOTE — Telephone Encounter (Signed)
I have placed FMLA forms in the bin upfront a with a charge sheet.   Please advise

## 2021-07-27 ENCOUNTER — Encounter: Payer: Self-pay | Admitting: Registered Nurse

## 2021-07-27 ENCOUNTER — Ambulatory Visit: Payer: Medicaid Other | Admitting: Registered Nurse

## 2021-07-27 ENCOUNTER — Other Ambulatory Visit: Payer: Self-pay

## 2021-07-27 VITALS — BP 141/84 | HR 75 | Temp 98.3°F | Resp 18 | Ht 68.5 in | Wt 316.6 lb

## 2021-07-27 DIAGNOSIS — J189 Pneumonia, unspecified organism: Secondary | ICD-10-CM | POA: Diagnosis not present

## 2021-07-27 MED ORDER — ALBUTEROL SULFATE HFA 108 (90 BASE) MCG/ACT IN AERS: 2.0000 | INHALATION_SPRAY | Freq: Four times a day (QID) | RESPIRATORY_TRACT | 0 refills | Status: DC | PRN

## 2021-07-27 MED ORDER — AZITHROMYCIN 250 MG PO TABS: ORAL_TABLET | ORAL | 0 refills | Status: AC

## 2021-07-27 NOTE — Patient Instructions (Addendum)
[name] -   Randie Heinz to see you! Congrats!  Take z pack as instructed.  Use albuterol inhaler 2 puffs every 6-8 hours through the day  Deep breathing exercises every hour - box breathing.  Call if worsening or failing to improve.  Thank you!  Rich     If you have lab work done today you will be contacted with your lab results within the next 2 weeks.  If you have not heard from [place] then please contact us. The fastest way to get your results is to register for My Chart.   IF you received an x-ray today, you will receive an invoice from [facility]. Please contact [facility] at [phone] with questions or concerns regarding your invoice.   IF you received labwork today, you will receive an invoice from [facility]. Please contact [facility] at [phone] with questions or concerns regarding your invoice.   Our billing staff will not be able to assist you with questions regarding bills from these companies.  You will be contacted with the lab results as soon as they are available. The fastest way to get your results is to activate your My Chart account. Instructions are located on the last page of this paperwork. If you have not heard from [place] regarding the results in 2 weeks, please contact this office.

## 2021-07-27 NOTE — Progress Notes (Signed)
Established Patient Office Visit  Subjective:  Patient ID: Hayley Webb, female    DOB: Dec 12, 1985  Age: 36 y.o. MRN: [id number]  CC:  Chief Complaint  Patient presents with   Follow-up    Patient states she is following up after having Pneumonia. Patient states she is starting to feel bad again but she is [redacted] weeks pregnant.     HPI Hayley Webb presents for follow up -  Notes some aches. Lethargic, fatigued.  Some coughing, shob.   Unsure if fatigue, weakness, nausea related to pregnancy  She has est with [place] [facility] as she is around 6 w pregnant.  Past Medical History:  Diagnosis Date   Anxiety    doing good now   Asthma    Headache(784.0)    HSV infection    Hypertension    Infection    UTI   Lupus (HCC)    dx age 75   Pulmonary embolism (HCC) 06/4/82011   STD (sexually transmitted disease) 02/2009   POSITIVE GC    Past Surgical History:  Procedure Laterality Date   ADENOIDECTOMY     TONSILLECTOMY AND ADENOIDECTOMY  1998   WISDOM TOOTH EXTRACTION      Family History  Problem Relation Age of Onset   Diabetes Maternal Aunt    Cancer Maternal Grandmother 23       COLON CA   Diabetes Maternal Grandmother    Hypertension Maternal Grandfather    Cancer Maternal Grandfather        prostate   Asthma Mother    Asthma Brother    Cancer Paternal Grandmother        breast   Lupus Paternal Grandmother    Lupus Paternal Aunt    Stroke Paternal Aunt    Anesthesia problems Neg Hx    Hypotension Neg Hx    Malignant hyperthermia Neg Hx    Pseudochol deficiency Neg Hx     Social History   Socioeconomic History   Marital status: Single    Spouse name: Not on file   Number of children: Not on file   Years of education: Not on file   Highest education level: Not on file  Occupational History   Not on file  Tobacco Use   Smoking status: Never   Smokeless tobacco: Never  Vaping Use   Vaping Use: Never used  Substance and Sexual Activity    Alcohol use: No   Drug use: No   Sexual activity: Yes    Partners: Male    Birth control/protection: None  Other Topics Concern   Not on file  Social History Narrative   Not on file   Social Determinants of Health   Financial Resource Strain: Not on file  Food Insecurity: Not on file  Transportation Needs: Not on file  Physical Activity: Not on file  Stress: Not on file  Social Connections: Not on file  Intimate Partner Violence: Not on file    Outpatient Medications Prior to Visit  Medication Sig Dispense Refill   albuterol (PROVENTIL) (2.5 MG/3ML) 0.083% nebulizer solution Take 3 mLs (2.5 mg total) by nebulization every 6 (six) hours as needed for wheezing or shortness of breath. 75 mL 0   EPINEPHrine 0.1 MG/0.1ML SOAJ Inject 1 Dose as directed as needed (for anaphylaxis). 2 each 2   Multiple Vitamin (MULTIVITAMIN) capsule Take 1 capsule by mouth daily.     albuterol (VENTOLIN HFA) 108 (90 Base) MCG/ACT inhaler Inhale 2 puffs into the lungs every  6 (six) hours as needed for wheezing or shortness of breath. 18 g 0   cyclobenzaprine (FLEXERIL) 10 MG tablet Take 10 mg by mouth 3 (three) times daily as needed for muscle spasms.     enoxaparin (LOVENOX) 40 MG/0.4ML injection Inject 1.5 mLs (150 mg total) into the skin every 12 (twelve) hours.     hydroxychloroquine (PLAQUENIL) 200 MG tablet Take 100 mg by mouth 2 (two) times daily.     ipratropium-albuterol (DUONEB) 0.5-2.5 (3) MG/3ML SOLN Take 3 mLs by nebulization every 6 (six) hours as needed. 60 mL 0   labetalol (NORMODYNE) 200 MG tablet Take 2 tablets (400 mg total) by mouth 2 (two) times daily. 60 tablet 0   methocarbamol (ROBAXIN) 500 MG tablet Take 1 tablet (500 mg total) by mouth 4 (four) times daily. (Patient not taking: Reported on 07/27/2021) 60 tablet 0   methylPREDNISolone (MEDROL PO) Take by mouth.     methylPREDNISolone (MEDROL) 4 MG tablet Take 1 tablet (4 mg total) by mouth daily. 5 tablet 0   sertraline (ZOLOFT) 50 MG  tablet TAKE 1/2 TABLET X 8 DAYS THEN INCREASE TO 1 TABLET DAILY 90 tablet 0   No facility-administered medications prior to visit.    Allergies  Allergen Reactions   Bactrim Anaphylaxis and Hives   Hydrocodone-Acetaminophen Anaphylaxis   Nifedipine Swelling and Anaphylaxis    Swelling of tongue   Pineapple Itching   Prednisone Anaphylaxis and Hives    Has been on Dexamethasone without issue.   Shellfish Allergy Itching   Sulfamethoxazole-Trimethoprim Other (See Comments)    Complications due to lupus   Nsaids Other (See Comments)    Has Lupus, reccommended to avoid NSAIDs   Other Itching    Shrimp:throat itching "can eat other shellfish"   Vicodin [Hydrocodone-Acetaminophen] Hives and Swelling    Can take Percocet and Tylenol without reaction.    ROS Review of Systems  Constitutional:  Positive for fatigue and fever.  HENT: Negative.    Eyes: Negative.   Respiratory:  Positive for cough and shortness of breath.   Cardiovascular: Negative.   Gastrointestinal: Negative.   Genitourinary: Negative.   Musculoskeletal: Negative.   Skin: Negative.   Neurological: Negative.   Psychiatric/Behavioral: Negative.    All other systems reviewed and are negative.    Objective:    Physical Exam Vitals and nursing note reviewed.  Constitutional:      General: She is not in acute distress.    Appearance: Normal appearance. She is normal weight. She is not ill-appearing, toxic-appearing or diaphoretic.  Cardiovascular:     Rate and Rhythm: Normal rate and regular rhythm.     Heart sounds: Normal heart sounds. No murmur heard.   No friction rub. No gallop.  Pulmonary:     Effort: Pulmonary effort is normal. No respiratory distress.     Breath sounds: No stridor. Wheezing (with some atelectasis in LL) present. No rhonchi or rales.  Chest:     Chest wall: No tenderness.  Skin:    General: Skin is warm and dry.  Neurological:     General: No focal deficit present.     Mental  Status: She is alert and oriented to person, place, and time. Mental status is at baseline.  Psychiatric:        Mood and Affect: Mood normal.        Behavior: Behavior normal.        Thought Content: Thought content normal.  Judgment: Judgment normal.    BP (!) 141/84    Pulse 75    Temp 98.3 F (36.8 C) (Temporal)    Resp 18    Ht 5' 8.5" (1.74 m)    Wt (!) 316 lb 9.6 oz (143.6 kg)    SpO2 99%    BMI 47.44 kg/m  Wt Readings from Last 3 Encounters:  07/27/21 (!) 316 lb 9.6 oz (143.6 kg)  10/20/20 (!) 309 lb (140.2 kg)  10/12/20 (!) 306 lb (138.8 kg)     Health Maintenance Due  Topic Date Due   COVID-19 Vaccine (1) Never done   Pneumococcal Vaccine 7219-34 Years old (1 - PCV) Never done   PAP SMEAR-Modifier  10/25/2019   INFLUENZA VACCINE  02/21/2021    There are no preventive care reminders to display for this patient.  Lab Results  Component Value Date   TSH 0.958 05/03/2020   Lab Results  Component Value Date   WBC 4.7 05/03/2020   HGB 12.6 05/03/2020   HCT 38.1 05/03/2020   MCV 87 05/03/2020   PLT 266 12/12/2019   Lab Results  Component Value Date   NA 137 05/03/2020   K 4.3 05/03/2020   CO2 22 05/03/2020   GLUCOSE 84 05/03/2020   BUN 10 05/03/2020   CREATININE 0.61 05/03/2020   BILITOT 0.3 05/03/2020   ALKPHOS 98 05/03/2020   AST 15 05/03/2020   ALT 14 05/03/2020   PROT 6.9 05/03/2020   ALBUMIN 4.4 05/03/2020   CALCIUM 9.6 05/03/2020   ANIONGAP 10 12/12/2019   Lab Results  Component Value Date   CHOL 203 (H) 05/03/2020   Lab Results  Component Value Date   HDL 50 05/03/2020   Lab Results  Component Value Date   LDLCALC 142 (H) 05/03/2020   Lab Results  Component Value Date   TRIG 62 05/03/2020   Lab Results  Component Value Date   CHOLHDL 4.1 05/03/2020   Lab Results  Component Value Date   HGBA1C 5.6 05/03/2020      Assessment & Plan:   Problem List Items Addressed This Visit   None Visit Diagnoses     Community  acquired pneumonia of left lower lobe of lung    -  Primary   Relevant Medications   albuterol (VENTOLIN HFA) 108 (90 Base) MCG/ACT inhaler   azithromycin (ZITHROMAX) 250 MG tablet       Meds ordered this encounter  Medications   albuterol (VENTOLIN HFA) 108 (90 Base) MCG/ACT inhaler    Sig: Inhale 2 puffs into the lungs every 6 (six) hours as needed for wheezing or shortness of breath.    Dispense:  18 g    Refill:  0    Order Specific Question:   Supervising Provider    Answer:   Neva SeatGREENE, JEFFREY R [[id number]]   azithromycin (ZITHROMAX) 250 MG tablet    Sig: Take 2 tablets on day 1, then 1 tablet daily on days 2 through 5    Dispense:  6 tablet    Refill:  0    Order Specific Question:   Supervising Provider    Answer:   Neva SeatGREENE, JEFFREY R [[id number]]    Follow-up: Return if symptoms worsen or fail to improve.   PLAN Concern for ongoing pna Z pack and regular use of albuterol - 2 puffs every 6-8 hours Deep breathing exercises. Return if worsening or failing to improve Patient encouraged to call clinic with any questions, comments, or concerns.  Hayley Severin  Orland Mustard, NP

## 2021-07-27 NOTE — Telephone Encounter (Signed)
Forms have been given to PCP 

## 2021-07-28 ENCOUNTER — Inpatient Hospital Stay (HOSPITAL_COMMUNITY)
Admission: AD | Admit: 2021-07-28 | Discharge: 2021-07-28 | Disposition: A | Discharge status: Home / Self Care | Payer: Medicaid Other | Attending: Obstetrics & Gynecology | Admitting: Obstetrics & Gynecology

## 2021-07-28 ENCOUNTER — Encounter (HOSPITAL_COMMUNITY): Payer: Self-pay | Admitting: Obstetrics & Gynecology

## 2021-07-28 ENCOUNTER — Inpatient Hospital Stay (HOSPITAL_COMMUNITY): Payer: Medicaid Other

## 2021-07-28 ENCOUNTER — Other Ambulatory Visit: Payer: Self-pay

## 2021-07-28 DIAGNOSIS — R109 Unspecified abdominal pain: Secondary | ICD-10-CM | POA: Diagnosis not present

## 2021-07-28 DIAGNOSIS — O209 Hemorrhage in early pregnancy, unspecified: Secondary | ICD-10-CM | POA: Insufficient documentation

## 2021-07-28 DIAGNOSIS — O26891 Other specified pregnancy related conditions, first trimester: Secondary | ICD-10-CM | POA: Diagnosis not present

## 2021-07-28 DIAGNOSIS — O26899 Other specified pregnancy related conditions, unspecified trimester: Secondary | ICD-10-CM

## 2021-07-28 DIAGNOSIS — Z3491 Encounter for supervision of normal pregnancy, unspecified, first trimester: Secondary | ICD-10-CM

## 2021-07-28 DIAGNOSIS — Z3A01 Less than 8 weeks gestation of pregnancy: Secondary | ICD-10-CM | POA: Diagnosis not present

## 2021-07-28 LAB — COMPREHENSIVE METABOLIC PANEL
ALT: 15 U/L (ref 0–44)
AST: 15 U/L (ref 15–41)
Albumin: 3.9 g/dL (ref 3.5–5.0)
Alkaline Phosphatase: 79 U/L (ref 38–126)
Anion gap: 7 (ref 5–15)
BUN: 12 mg/dL (ref 6–20)
CO2: 22 mmol/L (ref 22–32)
Calcium: 9.3 mg/dL (ref 8.9–10.3)
Chloride: 103 mmol/L (ref 98–111)
Creatinine, Ser: 0.87 mg/dL (ref 0.44–1.00)
GFR, Estimated: >60 mL/min (ref >60)
Glucose, Bld: 87 mg/dL (ref 70–99)
Potassium: 3.4 mmol/L — ABNORMAL LOW (ref 3.5–5.1)
Sodium: 132 mmol/L — ABNORMAL LOW (ref 135–145)
Total Bilirubin: 0.4 mg/dL (ref 0.3–1.2)
Total Protein: 7.5 g/dL (ref 6.5–8.1)

## 2021-07-28 LAB — CBC
HCT: 39.9 % (ref 36.0–46.0)
Hemoglobin: 13.3 g/dL (ref 12.0–15.0)
MCH: 28.4 pg (ref 26.0–34.0)
MCHC: 33.3 g/dL (ref 30.0–36.0)
MCV: 85.1 fL (ref 80.0–100.0)
Platelets: 346 10*3/uL (ref 150–400)
RBC: 4.69 MIL/uL (ref 3.87–5.11)
RDW: 14.5 % (ref 11.5–15.5)
WBC: 5.2 10*3/uL (ref 4.0–10.5)
nRBC: 0 % (ref 0.0–0.2)

## 2021-07-28 LAB — URINALYSIS, ROUTINE W REFLEX MICROSCOPIC
Bilirubin Urine: NEGATIVE
Glucose, UA: NEGATIVE mg/dL
Ketones, ur: NEGATIVE mg/dL
Leukocytes,Ua: NEGATIVE
Nitrite: NEGATIVE
Protein, ur: NEGATIVE mg/dL
Specific Gravity, Urine: 1.023 (ref 1.005–1.030)
pH: 5 (ref 5.0–8.0)

## 2021-07-28 LAB — WET PREP, GENITAL
Clue Cells Wet Prep HPF POC: NONE SEEN
Sperm: NONE SEEN
Trich, Wet Prep: NONE SEEN
WBC, Wet Prep HPF POC: >=10 — AB (ref <10)
Yeast Wet Prep HPF POC: NONE SEEN

## 2021-07-28 LAB — POCT PREGNANCY, URINE: Preg Test, Ur: POSITIVE — AB

## 2021-07-28 LAB — HCG, QUANTITATIVE, PREGNANCY: hCG, Beta Chain, Quant, S: 25493 m[IU]/mL — ABNORMAL HIGH (ref <5)

## 2021-07-28 NOTE — MAU Note (Incomplete)
Pt reports she has been having sharp abd pain on and off since this morning . Had a little bit brown/pinkish spotting this morning but none since.

## 2021-07-28 NOTE — MAU Provider Note (Signed)
History     CSN: [id number]  Arrival date and time: 07/28/21 1748   Event Date/Time   First Provider Initiated Contact with Patient 07/28/21 1907      Chief Complaint  Patient presents with   Abdominal Pain   HPI Hayley Webb is a 36 y.o [id number] at [redacted]w[redacted]d by LMP who presents to MAU for lower abdominal pain and pressure that started this morning. Pain has be constant for the last couple of hours. She reports on Sunday she had a small amount of vaginal bleeding, but just has a small amount of brown discharge that itches and has a slight odor. She denies vaginal bleeding currently, dysuria, hematuria, reports some frequency. LMP 06/04/2021. Has appointment with [facility] on 08/09/2021.  OB History     Gravida  4   Para  2   Term  1   Preterm  1   AB  1   Living  2      SAB  1   IAB      Ectopic      Multiple  0   Live Births  2           Past Medical History:  Diagnosis Date   Anxiety    doing good now   Asthma    Headache(784.0)    HSV infection    Hypertension    Infection    UTI   Lupus ([place])    dx age 12   Pulmonary embolism ([place]) 06/4/82011   STD (sexually transmitted disease) 02/2009   POSITIVE GC    Past Surgical History:  Procedure Laterality Date   ADENOIDECTOMY     TONSILLECTOMY AND ADENOIDECTOMY  1998   WISDOM TOOTH EXTRACTION      Family History  Problem Relation Age of Onset   Diabetes Maternal Aunt    Cancer Maternal Grandmother 6       COLON CA   Diabetes Maternal Grandmother    Hypertension Maternal Grandfather    Cancer Maternal Grandfather        prostate   Asthma Mother    Asthma Brother    Cancer Paternal Grandmother        breast   Lupus Paternal Grandmother    Lupus Paternal Aunt    Stroke Paternal Aunt    Anesthesia problems Neg Hx    Hypotension Neg Hx    Malignant hyperthermia Neg Hx    Pseudochol deficiency Neg Hx     Social History   Tobacco Use   Smoking status: Never   Smokeless tobacco: Never   Vaping Use   Vaping Use: Never used  Substance Use Topics   Alcohol use: No   Drug use: No    Allergies:  Allergies  Allergen Reactions   Bactrim Anaphylaxis and Hives   Hydrocodone-Acetaminophen Anaphylaxis   Nifedipine Swelling and Anaphylaxis    Swelling of tongue   Pineapple Itching   Prednisone Anaphylaxis and Hives    Has been on Dexamethasone without issue.   Shellfish Allergy Itching   Sulfamethoxazole-Trimethoprim Other (See Comments)    Complications due to lupus   Nsaids Other (See Comments)    Has Lupus, reccommended to avoid NSAIDs   Other Itching    Shrimp:throat itching "can eat other shellfish"   Vicodin [Hydrocodone-Acetaminophen] Hives and Swelling    Can take Percocet and Tylenol without reaction.    Medications Prior to Admission  Medication Sig Dispense Refill Last Dose   albuterol (PROVENTIL) (2.5 MG/3ML) 0.083% nebulizer  solution Take 3 mLs (2.5 mg total) by nebulization every 6 (six) hours as needed for wheezing or shortness of breath. 75 mL 0 Past Week   albuterol (VENTOLIN HFA) 108 (90 Base) MCG/ACT inhaler Inhale 2 puffs into the lungs every 6 (six) hours as needed for wheezing or shortness of breath. 18 g 0 Past Week   Multiple Vitamin (MULTIVITAMIN) capsule Take 1 capsule by mouth daily.   07/28/2021   azithromycin (ZITHROMAX) 250 MG tablet Take 2 tablets on day 1, then 1 tablet daily on days 2 through 5 6 tablet 0    EPINEPHrine 0.1 MG/0.1ML SOAJ Inject 1 Dose as directed as needed (for anaphylaxis). 2 each 2     Review of Systems  Constitutional: Negative.   Respiratory: Negative.    Cardiovascular: Negative.   Gastrointestinal:  Positive for abdominal pain.  Genitourinary:  Positive for frequency and vaginal bleeding (spotting). Negative for dysuria.  Musculoskeletal: Negative.   Neurological: Negative.    Physical Exam   Blood pressure (!) 137/91, pulse 92, temperature 97.8 F (36.6 C), resp. rate 16, height 5' 8.5" (1.74 m), weight  (!) 141.5 kg, last menstrual period 06/04/2021.  Physical Exam Vitals and nursing note reviewed. Exam conducted with a chaperone present.  Constitutional:      General: She is not in acute distress.    Appearance: She is obese.  Cardiovascular:     Rate and Rhythm: Normal rate.  Pulmonary:     Effort: Pulmonary effort is normal.  Abdominal:     Palpations: Abdomen is soft.     Tenderness: There is no abdominal tenderness.  Genitourinary:    Vagina: Normal.  Neurological:     Mental Status: She is alert.   US OB LESS THAN 14 WEEKS WITH OB TRANSVAGINAL  Result Date: 07/28/2021 CLINICAL DATA:  Pain. EXAM: OBSTETRIC <14 WK [place] AND TRANSVAGINAL OB US TECHNIQUE: Both transabdominal and transvaginal ultrasound examinations were performed for complete evaluation of the gestation as well as the maternal uterus, adnexal regions, and pelvic cul-de-sac. Transvaginal technique was performed to assess early pregnancy. COMPARISON:  None. FINDINGS: Intrauterine gestational sac: Single Yolk sac:  Visualized. Embryo:  Visualized. Cardiac Activity: Visualized. Heart Rate: 111 bpm CRL:  5.6 mm   6 w   2 d                  [place] EDC: 03/21/2022 Subchorionic hemorrhage:  None visualized. Maternal uterus/adnexae: Bilateral maternal ovaries are visualized and within normal limits. There is no pelvic free fluid. IMPRESSION: 1. Single live intrauterine gestation measuring 6 weeks 2 days by crown-rump length. Electronically Signed   By: Ronney Asters M.D.   On: 07/28/2021 20:00    MAU Course  Procedures UA, culture CBC, CMP, HCG [place]   MDM UA small amount of blood, culture pending. Wet prep wnl, GC/CT pending. Blood type O positive, Rhogam not indicated. IUP at [redacted]w[redacted]d with FHR present.   Assessment and Plan  [redacted] weeks gestation of pregnancy IUP Abdominal pain affecting pregnancy  - Discharge home in stable condition - Keep OB appointment as scheduled on 08/09/2021 - Strict return precautions reviewed. Return to MAU  as needed or for worsening symptoms   Renee Harder, [name] 07/28/2021, 9:15 PM

## 2021-07-28 NOTE — Progress Notes (Signed)
Camelia Eng [name] in to discuss test results and d/c plan. Written and verbal d/c instructions given and understanding voiced.

## 2021-07-29 LAB — GC/CHLAMYDIA PROBE AMP (~~LOC~~) NOT AT ARMC
Chlamydia: NEGATIVE
Comment: NEGATIVE
Comment: NORMAL
Neisseria Gonorrhea: NEGATIVE

## 2021-07-30 LAB — CULTURE, OB URINE: Culture: <10000 — AB

## 2021-08-03 ENCOUNTER — Other Ambulatory Visit: Payer: Self-pay

## 2021-08-03 ENCOUNTER — Inpatient Hospital Stay (HOSPITAL_COMMUNITY)
Admission: AD | Admit: 2021-08-03 | Discharge: 2021-08-03 | Disposition: A | Discharge status: Home / Self Care | Payer: Medicaid Other | Attending: Obstetrics and Gynecology | Admitting: Obstetrics and Gynecology

## 2021-08-03 ENCOUNTER — Encounter (HOSPITAL_COMMUNITY): Payer: Self-pay | Admitting: Obstetrics and Gynecology

## 2021-08-03 DIAGNOSIS — Z3A01 Less than 8 weeks gestation of pregnancy: Secondary | ICD-10-CM | POA: Insufficient documentation

## 2021-08-03 DIAGNOSIS — O26891 Other specified pregnancy related conditions, first trimester: Secondary | ICD-10-CM | POA: Diagnosis not present

## 2021-08-03 DIAGNOSIS — O219 Vomiting of pregnancy, unspecified: Secondary | ICD-10-CM | POA: Diagnosis not present

## 2021-08-03 DIAGNOSIS — R102 Pelvic and perineal pain: Secondary | ICD-10-CM | POA: Insufficient documentation

## 2021-08-03 HISTORY — DX: Urinary tract infection, site not specified: N39.0

## 2021-08-03 LAB — URINALYSIS, ROUTINE W REFLEX MICROSCOPIC
Bilirubin Urine: NEGATIVE
Glucose, UA: NEGATIVE mg/dL
Ketones, ur: NEGATIVE mg/dL
Nitrite: NEGATIVE
Protein, ur: NEGATIVE mg/dL
Specific Gravity, Urine: 1.02 (ref 1.005–1.030)
pH: 6 (ref 5.0–8.0)

## 2021-08-03 LAB — CBC
HCT: 38 % (ref 36.0–46.0)
Hemoglobin: 12.8 g/dL (ref 12.0–15.0)
MCH: 28.5 pg (ref 26.0–34.0)
MCHC: 33.7 g/dL (ref 30.0–36.0)
MCV: 84.6 fL (ref 80.0–100.0)
Platelets: 364 10*3/uL (ref 150–400)
RBC: 4.49 MIL/uL (ref 3.87–5.11)
RDW: 14.4 % (ref 11.5–15.5)
WBC: 5.5 10*3/uL (ref 4.0–10.5)
nRBC: 0 % (ref 0.0–0.2)

## 2021-08-03 LAB — COMPREHENSIVE METABOLIC PANEL
ALT: 14 U/L (ref 0–44)
AST: 16 U/L (ref 15–41)
Albumin: 3.7 g/dL (ref 3.5–5.0)
Alkaline Phosphatase: 80 U/L (ref 38–126)
Anion gap: 9 (ref 5–15)
BUN: 7 mg/dL (ref 6–20)
CO2: 21 mmol/L — ABNORMAL LOW (ref 22–32)
Calcium: 9.2 mg/dL (ref 8.9–10.3)
Chloride: 105 mmol/L (ref 98–111)
Creatinine, Ser: 0.67 mg/dL (ref 0.44–1.00)
GFR, Estimated: >60 mL/min (ref >60)
Glucose, Bld: 82 mg/dL (ref 70–99)
Potassium: 3.5 mmol/L (ref 3.5–5.1)
Sodium: 135 mmol/L (ref 135–145)
Total Bilirubin: 0.6 mg/dL (ref 0.3–1.2)
Total Protein: 7.3 g/dL (ref 6.5–8.1)

## 2021-08-03 LAB — URINALYSIS, MICROSCOPIC (REFLEX)

## 2021-08-03 MED ORDER — ONDANSETRON 4 MG PO TBDP
4.0000 mg | ORAL_TABLET | Freq: Once | ORAL | Status: AC
Start: 2021-08-03 — End: 2021-08-03
  Administered 2021-08-03: 4 mg via ORAL
  Filled 2021-08-03: qty 1

## 2021-08-03 MED ORDER — DOXYLAMINE SUCCINATE (SLEEP) 25 MG PO TABS
25.0000 mg | ORAL_TABLET | Freq: Three times a day (TID) | ORAL | 0 refills | Status: DC | PRN
Start: 2021-08-03 — End: 2022-01-18

## 2021-08-03 MED ORDER — PROMETHAZINE HCL 12.5 MG PO TABS: 12.5000 mg | ORAL_TABLET | Freq: Four times a day (QID) | ORAL | 0 refills | Status: DC | PRN

## 2021-08-03 NOTE — Discharge Instructions (Signed)
Vomiting in First Trimester Follow these instructions at home: To help relieve your symptoms, listen to your body. Everyone is different and has different preferences. Find what works best for you. Here are some things you can try to help relieve your symptoms: Meals and snacks Eat 5-6 small meals daily instead of 3 large meals. Eating small meals and snacks can help you avoid an empty stomach. Before getting out of bed, eat a couple of crackers to avoid moving around on an empty stomach. Eat a protein-rich snack before bed. Examples include cheese and crackers, or a peanut butter sandwich made with 1 slice of whole-wheat bread and 1 tsp (5 g) of peanut butter. Eat and drink slowly. Try eating starchy foods as these are usually tolerated well. Examples include cereal, toast, bread, potatoes, pasta, rice, and pretzels. Eat at least one serving of protein with your meals and snacks. Protein options include lean meats, poultry, seafood, beans, nuts, nut butters, eggs, cheese, and yogurt. Eat or suck on things that have ginger in them. It may help to relieve nausea. Add  tsp (0.44 g) ground ginger to hot tea, or choose ginger tea.   Fluids It is important to stay hydrated. Try to: Drink small amounts of fluids often. Drink fluids 30 minutes before or after a meal to help lessen the feeling of a full stomach. Drink 100% fruit juice or an electrolyte drink. An electrolyte drink contains sodium, potassium, and chloride. Drink fluids that are cold, clear, and carbonated or sour. These include lemonade, ginger ale, lemon-lime soda, ice water, and sparkling water. Things to avoid Avoid the following: Eating foods that trigger your symptoms. These may include spicy foods, coffee, high-fat foods, very sweet foods, and acidic foods. Drinking more than 1 cup of fluid at a time. Skipping meals. Nausea can be more intense on an empty stomach. If you cannot tolerate food, do not force it. Try sucking on ice  chips or other frozen items and make up for missed calories later. Lying down within 2 hours after eating. Being exposed to environmental triggers. These may include food smells, smoky rooms, closed spaces, rooms with strong smells, warm or humid places, overly loud and noisy rooms, and rooms with motion or flickering lights. Try eating meals in a well-ventilated area that is free of strong smells. Making quick and sudden changes in your movement. Taking iron pills and multivitamins that contain iron. If you take prescription iron pills, do not stop taking them unless your health care provider approves. Preparing food. The smell of food can spoil your appetite or trigger nausea. General instructions Brush your teeth or use a mouth rinse after meals. Take over-the-counter and prescription medicines only as told by your health care provider. Follow instructions from your health care provider about eating or drinking restrictions. Talk with your health care provider about starting a supplement of vitamin B6. Continue to take your prenatal vitamins as told by your health care provider. If you are having trouble taking your prenatal vitamins, talk with your health care provider about other options. Keep all follow-up visits. This is important. Follow-up visits include prenatal visits. Contact a health care provider if: You have pain in your abdomen. You have a severe headache. You have vision problems. You are losing weight. You feel weak or dizzy. You cannot eat or drink without vomiting, especially if this goes on for a full day. Get help right away if: You cannot drink fluids without vomiting. You vomit blood. You have constant   nausea and vomiting. You are very weak. You faint. You have a fever and your symptoms suddenly get worse. Summary Making some changes to your eating habits may help relieve nausea and vomiting. This condition may be managed with lifestyle changes and medicines as  prescribed by your health care provider. If medicines do not help relieve nausea and vomiting, you may need to receive fluids through an IV at the hospital. This information is not intended to replace advice given to you by your health care provider. Make sure you discuss any questions you have with your health care provider. Document Revised: 02/02/2020 Document Reviewed: 02/02/2020 Elsevier Patient Education  2021 Elsevier Inc.  

## 2021-08-03 NOTE — Telephone Encounter (Signed)
Need to know the time she has missed for this paperwork  Thanks  Rich

## 2021-08-03 NOTE — Telephone Encounter (Signed)
06/24/21 - 06/30/2021 she missed these dates and was told by her HR Dept to the forms are due today by 5PM  °

## 2021-08-03 NOTE — MAU Provider Note (Signed)
History     CSN: [id number]  Arrival date and time: 08/03/21 1146   Event Date/Time   First Provider Initiated Contact with Patient 08/03/21 1252      Chief Complaint  Patient presents with   Abdominal Pain   Emesis   Nausea   HPI Hayley Webb is a 36 y.o. [id number] at [redacted]w[redacted]d who presents to MAU with chief complaint of severe nausea and vomiting. This is a new problem, onset Monday 08/01/2020. Patient reports vomiting each hour. She is unable to tolerate anything PO. She does not have any active prescriptions for antiemetics.   Patient reports secondary complaint of abdominal cramping. This is a recurrent problem, onset coinciding with onset of vomiting. Patient's pain is suprapubic. Pain score 6/10.  Pain is aggravated by vomiting. She denies alleviating factors. She has not taken medication or tried other treatments for this complaint.   Live IUP confirmed in office last week with [facility].  OB History     Gravida  4   Para  2   Term  1   Preterm  1   AB  1   Living  2      SAB  1   IAB      Ectopic      Multiple  0   Live Births  2           Past Medical History:  Diagnosis Date   Anxiety    doing good now   Asthma    Headache(784.0)    HSV infection    Hypertension    Infection    UTI   Lupus (HCC)    dx age 55   Pulmonary embolism (HCC) 06/4/82011   STD (sexually transmitted disease) 02/2009   POSITIVE GC    Past Surgical History:  Procedure Laterality Date   ADENOIDECTOMY     TONSILLECTOMY AND ADENOIDECTOMY  1998   WISDOM TOOTH EXTRACTION      Family History  Problem Relation Age of Onset   Diabetes Maternal Aunt    Cancer Maternal Grandmother 95       COLON CA   Diabetes Maternal Grandmother    Hypertension Maternal Grandfather    Cancer Maternal Grandfather        prostate   Asthma Mother    Asthma Brother    Cancer Paternal Grandmother        breast   Lupus Paternal Grandmother    Lupus Paternal Aunt    Stroke Paternal  Aunt    Anesthesia problems Neg Hx    Hypotension Neg Hx    Malignant hyperthermia Neg Hx    Pseudochol deficiency Neg Hx     Social History   Tobacco Use   Smoking status: Never   Smokeless tobacco: Never  Vaping Use   Vaping Use: Never used  Substance Use Topics   Alcohol use: No   Drug use: No    Allergies:  Allergies  Allergen Reactions   Bactrim Anaphylaxis and Hives   Hydrocodone-Acetaminophen Anaphylaxis   Nifedipine Swelling and Anaphylaxis    Swelling of tongue   Pineapple Itching   Prednisone Anaphylaxis and Hives    Has been on Dexamethasone without issue.   Shellfish Allergy Itching   Sulfamethoxazole-Trimethoprim Other (See Comments)    Complications due to lupus   Nsaids Other (See Comments)    Has Lupus, reccommended to avoid NSAIDs   Other Itching    Shrimp:throat itching "can eat other shellfish"   Vicodin [Hydrocodone-Acetaminophen]  Hives and Swelling    Can take Percocet and Tylenol without reaction.    Medications Prior to Admission  Medication Sig Dispense Refill Last Dose   albuterol (PROVENTIL) (2.5 MG/3ML) 0.083% nebulizer solution Take 3 mLs (2.5 mg total) by nebulization every 6 (six) hours as needed for wheezing or shortness of breath. 75 mL 0    albuterol (VENTOLIN HFA) 108 (90 Base) MCG/ACT inhaler Inhale 2 puffs into the lungs every 6 (six) hours as needed for wheezing or shortness of breath. 18 g 0    EPINEPHrine 0.1 MG/0.1ML SOAJ Inject 1 Dose as directed as needed (for anaphylaxis). 2 each 2    Multiple Vitamin (MULTIVITAMIN) capsule Take 1 capsule by mouth daily.       Review of Systems  Gastrointestinal:  Positive for abdominal pain, nausea and vomiting.  All other systems reviewed and are negative. Physical Exam   Blood pressure (!) 149/94, pulse 79, temperature 98.1 F (36.7 C), temperature source Oral, resp. rate 16, height 5' 8.5" (1.74 m), weight (!) 142.3 kg, last menstrual period 06/04/2021, SpO2 99 %.  Physical  Exam Vitals and nursing note reviewed. Exam conducted with a chaperone present.  Constitutional:      Appearance: She is well-developed.  Cardiovascular:     Rate and Rhythm: Normal rate.     Heart sounds: Normal heart sounds.  Pulmonary:     Effort: Pulmonary effort is normal.  Abdominal:     Palpations: Abdomen is soft.     Tenderness: There is no abdominal tenderness.  Skin:    Capillary Refill: Capillary refill takes less than 2 seconds.  Neurological:     Mental Status: She is alert and oriented to person, place, and time.    MAU Course  Procedures  --No episodes of vomiting during MAU encounter. Reviewed guidelines for paced eating with bland simple carbs. Grazing in small amounts throughout day, take medications when available.   Patient Vitals for the past 24 hrs:  BP Temp Temp src Pulse Resp SpO2 Height Weight  08/03/21 1206 (!) 149/94 98.1 F (36.7 C) Oral 79 16 99 % 5' 8.5" (1.74 m) (!) 142.3 kg   Orders Placed This Encounter  Procedures   Urinalysis, Routine w reflex microscopic Urine, Clean Catch   Urinalysis, Microscopic (reflex)   CBC   Comprehensive metabolic panel   Results for orders placed or performed during the hospital encounter of 08/03/21 (from the past 24 hour(s))  Urinalysis, Routine w reflex microscopic Urine, Clean Catch     Status: Abnormal   Collection Time: 08/03/21 12:12 PM  Result Value Ref Range   Color, Urine YELLOW YELLOW   APPearance CLEAR CLEAR   Specific Gravity, Urine 1.020 1.005 - 1.030   pH 6.0 5.0 - 8.0   Glucose, UA NEGATIVE NEGATIVE mg/dL   Hgb urine dipstick SMALL (A) NEGATIVE   Bilirubin Urine NEGATIVE NEGATIVE   Ketones, ur NEGATIVE NEGATIVE mg/dL   Protein, ur NEGATIVE NEGATIVE mg/dL   Nitrite NEGATIVE NEGATIVE   Leukocytes,Ua TRACE (A) NEGATIVE  Urinalysis, Microscopic (reflex)     Status: Abnormal   Collection Time: 08/03/21 12:12 PM  Result Value Ref Range   RBC / HPF 0-5 0 - 5 RBC/hpf   WBC, UA 0-5 0 - 5  WBC/hpf   Bacteria, UA RARE (A) NONE SEEN   Squamous Epithelial / LPF 0-5 0 - 5   Mucus PRESENT   CBC     Status: None   Collection Time: 08/03/21  1:11  PM  Result Value Ref Range   WBC 5.5 4.0 - 10.5 K/uL   RBC 4.49 3.87 - 5.11 MIL/uL   Hemoglobin 12.8 12.0 - 15.0 g/dL   HCT 16.138.0 09.636.0 - 04.546.0 %   MCV 84.6 80.0 - 100.0 fL   MCH 28.5 26.0 - 34.0 pg   MCHC 33.7 30.0 - 36.0 g/dL   RDW 40.914.4 81.111.5 - 91.415.5 %   Platelets 364 150 - 400 K/uL   nRBC 0.0 0.0 - 0.2 %  Comprehensive metabolic panel     Status: Abnormal   Collection Time: 08/03/21  1:11 PM  Result Value Ref Range   Sodium 135 135 - 145 mmol/L   Potassium 3.5 3.5 - 5.1 mmol/L   Chloride 105 98 - 111 mmol/L   CO2 21 (L) 22 - 32 mmol/L   Glucose, Bld 82 70 - 99 mg/dL   BUN 7 6 - 20 mg/dL   Creatinine, Ser 7.820.67 0.44 - 1.00 mg/dL   Calcium 9.2 8.9 - 95.610.3 mg/dL   Total Protein 7.3 6.5 - 8.1 g/dL   Albumin 3.7 3.5 - 5.0 g/dL   AST 16 15 - 41 U/L   ALT 14 0 - 44 U/L   Alkaline Phosphatase 80 38 - 126 U/L   Total Bilirubin 0.6 0.3 - 1.2 mg/dL   GFR, Estimated >21>60 >30>60 mL/min   Anion gap 9 5 - 15   Meds ordered this encounter  Medications   ondansetron (ZOFRAN-ODT) disintegrating tablet 4 mg   doxylamine, Sleep, (UNISOM) 25 MG tablet    Sig: Take 1 tablet (25 mg total) by mouth every 8 (eight) hours as needed.    Dispense:  30 tablet    Refill:  0    Order Specific Question:   Supervising Provider    Answer:   Adam PhenixARNOLD, JAMES G [[id number]]   promethazine (PHENERGAN) 12.5 MG tablet    Sig: Take 1 tablet (12.5 mg total) by mouth every 6 (six) hours as needed for nausea or vomiting.    Dispense:  30 tablet    Refill:  0    Order Specific Question:   Supervising Provider    Answer:   Adam PhenixRNOLD, JAMES G [[id number]]    Assessment and Plan  --36 y.o. [phone] at 5011w1d  --Vomiting without ketonuria --New outpatient regimen --Discharge home in stable condition  Calvert CantorSamantha C Kymani Webb, [name] 08/03/2021, 3:45 PM

## 2021-08-03 NOTE — Telephone Encounter (Signed)
06/24/21 - 06/30/2021 she missed these dates and was told by her HR Dept to the forms are due today by 5PM

## 2021-08-03 NOTE — Telephone Encounter (Signed)
Pt is calling back for follow up on FMLA

## 2021-08-03 NOTE — MAU Note (Signed)
Has been vomiting since Monday.  Can't hold down any food or fluids.  Has not been given any meds for nausea.  Called office, was told to come here. Denies bleeding, has had some cramping.

## 2021-08-03 NOTE — Telephone Encounter (Signed)
PCP has the paperwork and going to work on  it todayy.

## 2021-08-04 NOTE — Telephone Encounter (Signed)
Forms have been  faxed back and sent to scan and charge entry

## 2021-08-08 DIAGNOSIS — Z8751 Personal history of pre-term labor: Secondary | ICD-10-CM | POA: Insufficient documentation

## 2021-08-09 DIAGNOSIS — Z8751 Personal history of pre-term labor: Secondary | ICD-10-CM | POA: Diagnosis not present

## 2021-08-09 DIAGNOSIS — F419 Anxiety disorder, unspecified: Secondary | ICD-10-CM | POA: Diagnosis not present

## 2021-08-09 DIAGNOSIS — O10019 Pre-existing essential hypertension complicating pregnancy, unspecified trimester: Secondary | ICD-10-CM | POA: Diagnosis not present

## 2021-08-09 DIAGNOSIS — N925 Other specified irregular menstruation: Secondary | ICD-10-CM | POA: Diagnosis not present

## 2021-08-09 DIAGNOSIS — F32A Depression, unspecified: Secondary | ICD-10-CM | POA: Diagnosis not present

## 2021-08-09 DIAGNOSIS — Z113 Encounter for screening for infections with a predominantly sexual mode of transmission: Secondary | ICD-10-CM | POA: Diagnosis not present

## 2021-08-09 DIAGNOSIS — Z3A08 8 weeks gestation of pregnancy: Secondary | ICD-10-CM | POA: Diagnosis not present

## 2021-08-09 DIAGNOSIS — Z86711 Personal history of pulmonary embolism: Secondary | ICD-10-CM | POA: Diagnosis not present

## 2021-08-09 DIAGNOSIS — Z8759 Personal history of other complications of pregnancy, childbirth and the puerperium: Secondary | ICD-10-CM | POA: Diagnosis not present

## 2021-08-09 DIAGNOSIS — Z889 Allergy status to unspecified drugs, medicaments and biological substances status: Secondary | ICD-10-CM | POA: Diagnosis not present

## 2021-08-09 DIAGNOSIS — B009 Herpesviral infection, unspecified: Secondary | ICD-10-CM | POA: Diagnosis not present

## 2021-08-09 DIAGNOSIS — Z91013 Allergy to seafood: Secondary | ICD-10-CM | POA: Diagnosis not present

## 2021-08-09 DIAGNOSIS — O219 Vomiting of pregnancy, unspecified: Secondary | ICD-10-CM | POA: Diagnosis not present

## 2021-08-09 DIAGNOSIS — O3680X9 Pregnancy with inconclusive fetal viability, other fetus: Secondary | ICD-10-CM | POA: Diagnosis not present

## 2021-08-09 LAB — OB RESULTS CONSOLE HEPATITIS B SURFACE ANTIGEN: Hepatitis B Surface Ag: NEGATIVE

## 2021-08-11 DIAGNOSIS — E559 Vitamin D deficiency, unspecified: Secondary | ICD-10-CM | POA: Insufficient documentation

## 2021-08-12 ENCOUNTER — Other Ambulatory Visit: Payer: Self-pay | Admitting: Obstetrics and Gynecology

## 2021-08-12 DIAGNOSIS — Z363 Encounter for antenatal screening for malformations: Secondary | ICD-10-CM

## 2021-08-23 DIAGNOSIS — Z3A10 10 weeks gestation of pregnancy: Secondary | ICD-10-CM | POA: Diagnosis not present

## 2021-08-23 DIAGNOSIS — L930 Discoid lupus erythematosus: Secondary | ICD-10-CM | POA: Diagnosis not present

## 2021-08-24 ENCOUNTER — Ambulatory Visit: Payer: Medicaid Other | Admitting: *Deleted

## 2021-08-24 ENCOUNTER — Other Ambulatory Visit: Payer: Self-pay | Admitting: *Deleted

## 2021-08-24 ENCOUNTER — Other Ambulatory Visit: Payer: Self-pay

## 2021-08-24 ENCOUNTER — Ambulatory Visit: Payer: Medicaid Other | Attending: Obstetrics and Gynecology

## 2021-08-24 ENCOUNTER — Ambulatory Visit (HOSPITAL_BASED_OUTPATIENT_CLINIC_OR_DEPARTMENT_OTHER): Payer: Medicaid Other | Admitting: Maternal & Fetal Medicine

## 2021-08-24 VITALS — BP 128/87 | HR 79

## 2021-08-24 DIAGNOSIS — D6861 Antiphospholipid syndrome: Secondary | ICD-10-CM

## 2021-08-24 DIAGNOSIS — Z3A10 10 weeks gestation of pregnancy: Secondary | ICD-10-CM | POA: Diagnosis not present

## 2021-08-24 DIAGNOSIS — Z363 Encounter for antenatal screening for malformations: Secondary | ICD-10-CM | POA: Insufficient documentation

## 2021-08-24 DIAGNOSIS — Z86711 Personal history of pulmonary embolism: Secondary | ICD-10-CM | POA: Diagnosis not present

## 2021-08-24 DIAGNOSIS — O10919 Unspecified pre-existing hypertension complicating pregnancy, unspecified trimester: Secondary | ICD-10-CM

## 2021-08-24 DIAGNOSIS — O35BXX0 Maternal care for other (suspected) fetal abnormality and damage, fetal cardiac anomalies, not applicable or unspecified: Secondary | ICD-10-CM | POA: Insufficient documentation

## 2021-08-24 DIAGNOSIS — O09211 Supervision of pregnancy with history of pre-term labor, first trimester: Secondary | ICD-10-CM

## 2021-08-24 DIAGNOSIS — O99211 Obesity complicating pregnancy, first trimester: Secondary | ICD-10-CM | POA: Diagnosis not present

## 2021-08-24 DIAGNOSIS — Z8679 Personal history of other diseases of the circulatory system: Secondary | ICD-10-CM

## 2021-08-24 DIAGNOSIS — O09291 Supervision of pregnancy with other poor reproductive or obstetric history, first trimester: Secondary | ICD-10-CM

## 2021-08-24 DIAGNOSIS — O10012 Pre-existing essential hypertension complicating pregnancy, second trimester: Secondary | ICD-10-CM | POA: Diagnosis not present

## 2021-08-24 DIAGNOSIS — D6862 Lupus anticoagulant syndrome: Secondary | ICD-10-CM

## 2021-08-25 NOTE — Progress Notes (Signed)
MFM Consultation.  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  She is currently on plaquenil 100 mg BID. She had a prior pregnancy before last that was diagnosed with first degree heart block. The child was treated with IV IG with resolution.  Her last pregnancy delivered at in 2020, in which I had the pleasure of seeing her. She was also comanaged by Hayley Webb of [facility]. The baby had normal echocardiogram screening throughout the pregnancy.   She was on plaquenil 400 mg BID and medrol 8 mg day. She delivered at 40 + weeks due to preeclampsia.  She is currently doing well and has been without flares for > 6 months. She did recently note that her hands were beginning to ache.  2) h/o pulmonary embolism x 3- she had 3 prior episodes of PE/stroke that is well documented. She is currently taking therapeutic Lovenox as in the prior pregnancy.  3) Chronic hypertension her blood pressure today is 128/87 mmHg. She is not taking antihypertensive therapy but plans to initiate daily ASA at 12 weeks.  4) Obesity (BMI 48)   Vitals with BMI 08/24/2021 08/03/2021 07/28/2021  Height - 5' 8.5" -  Weight - 313 lbs 13 oz -  BMI - [id number] -  Systolic 0000000 [id number] 0000000  Diastolic 87 94 91  Pulse 79 79 -   CBC Latest Ref Rng & Units 08/03/2021 07/28/2021 05/03/2020  WBC 4.0 - 10.5 K/uL 5.5 5.2 4.7  Hemoglobin 12.0 - 15.0 g/dL 12.8 13.3 12.6  Hematocrit 36.0 - 46.0 % 38.0 39.9 38.1  Platelets 150 - 400 K/uL 364 346 -   CMP Latest Ref Rng & Units 08/03/2021 07/28/2021 05/03/2020  Glucose 70 - 99 mg/dL 82 87 84  BUN 6 - 20 mg/dL 7 12 10   Creatinine 0.44 - 1.00 mg/dL 0.67 0.87 0.61  Sodium 135 - 145 mmol/L 135 132(L) 137  Potassium 3.5 - 5.1 mmol/L 3.5 3.4(L) 4.3  Chloride 98 - 111 mmol/L 105  103 101  CO2 22 - 32 mmol/L 21(L) 22 22  Calcium 8.9 - 10.3 mg/dL 9.2 9.3 9.6  Total Protein 6.5 - 8.1 g/dL 7.3 7.5 6.9  Total Bilirubin 0.3 - 1.2 mg/dL 0.6 0.4 0.3  Alkaline Phos 38 - 126 U/L 80 79 98  AST 15 - 41 U/L 16 15 15   ALT 0 - 44 U/L 14 15 14    OB History  Gravida Para Term Preterm AB Living  4 2 1 1 1 2   SAB IAB Ectopic Multiple Live Births  1 0 0 0 2    # Outcome Date GA Lbr Len/2nd Weight Sex Delivery Anes PTL Lv  4 Current           3 Preterm 01/15/19 [redacted]w[redacted]d 597:36 / 00:10 6 lb 5.1 oz (2.865 kg) M Vag-Spont EPI  LIV     Name: Hayley Webb, Hayley Webb     Apgar1: 7  Apgar5: 8  2 Term 10/25/13 [redacted]w[redacted]d 09:43 / 00:14  M Vag-Vacuum EPI  LIV     Name: Hayley Webb     Apgar1: 9  Apgar5: 9  1 SAB            Past Medical History:  Diagnosis Date   Anxiety  doing good now   Asthma    Depression    doing good   Headache(784.0)    HSV infection    Hypertension    was taken off meds after last preg   Infection    UTI   Lupus [facility])    dx age 67   Pulmonary embolism ([facility]) 06/4/82011   STD (sexually transmitted disease) 02/2009   POSITIVE GC   UTI (urinary tract infection)    Past Surgical History:  Procedure Laterality Date   ADENOIDECTOMY     TONSILLECTOMY AND ADENOIDECTOMY  1998   WISDOM TOOTH EXTRACTION     Social History   Socioeconomic History   Marital status: Single    Spouse name: Not on file   Number of children: Not on file   Years of education: Not on file   Highest education level: Not on file  Occupational History   Not on file  Tobacco Use   Smoking status: Never   Smokeless tobacco: Never  Vaping Use   Vaping Use: Never used  Substance and Sexual Activity   Alcohol use: No   Drug use: No   Sexual activity: Not Currently    Partners: Male    Birth control/protection: None  Other Topics Concern   Not on file  Social History Narrative   Not on file   Social Determinants of Health   Financial Resource Strain: Not on file  Food  Insecurity: Not on file  Transportation Needs: Not on file  Physical Activity: Not on file  Stress: Not on file  Social Connections: Not on file  Intimate Partner Violence: Not on file   Family History  Problem Relation Age of Onset   Asthma Mother    Healthy Father    Asthma Brother    Diabetes Maternal Aunt    Lupus Paternal Aunt    Stroke Paternal Aunt    Cancer Maternal Grandmother 72       COLON CA   Diabetes Maternal Grandmother    Hypertension Maternal Grandfather    Cancer Maternal Grandfather        prostate   Cancer Paternal Grandmother        breast   Lupus Paternal Grandmother    Anesthesia problems Neg Hx    Hypotension Neg Hx    Malignant hyperthermia Neg Hx    Pseudochol deficiency Neg Hx    Allergies  Allergen Reactions   Bactrim Anaphylaxis and Hives   Hydrocodone-Acetaminophen Anaphylaxis   Nifedipine Swelling and Anaphylaxis    Swelling of tongue   Pineapple Itching   Prednisone Anaphylaxis and Hives    Has been on Dexamethasone without issue.   Shellfish Allergy Itching   Sulfamethoxazole-Trimethoprim Other (See Comments)    Complications due to lupus   Nsaids Other (See Comments)    Has Lupus, reccommended to avoid NSAIDs   Other Itching    Shrimp:throat itching "can eat other shellfish"   Vicodin [Hydrocodone-Acetaminophen] Hives and Swelling    Can take Percocet and Tylenol without reaction.   Imaging: Single intrauterine pregnancy with measurements consistent with dates. Due to early pregnancy a detailed anatomy is precluded. Please see ultrasound report for details.  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 She is acutely aware of the management as this was a her third pregnancy.  She is aware of the increased risk of preeclampsia, fetal growth restriction, stillbirth, recurrent CHB, and flares.  As in prior pregnancy we will increase her  plaquenil to 200 bid and make a referral to Hayley Webb office at [facility]. She may require an addition of medrol for pain management.  Given the above fetal risk recommend serial growth exams, early fetal echocardiogram around 18-20 weeks given her positive Ro antibody and prior affected child. She will also take daily low dose ASA at 12 weeks.  Labs such as UPC, CMP with complement and an lupus panel including ANA and dsDNA, and urine sediment to assess her current activity. It may also be wise to increase her plaquenil to 300-400 mg bid as we know her pregnancy history.  2) Prior h/o of PE- I agree with the current therapeutic regimen of Lovenox, she should continue throughout pregnancy and not transition at 36 week heparin. Stopping 12-24 hour prior to IOL or C/S and restarting 4-6 after vaginal delivery and 6-12 hour after cesarean.  3) History of chronic hypertension with preeclampsia. I explained that she is likely >50% chance of recurrent preeclampsia. She is not on medication. However, she will initiate daily low dose ASA for prevention at 12 weeks.  4) Elevated BMI-   I discussed that the increased risk of those note above including gestational diabetes.   We recommend total weight gain of 10-20 lbs.   Anesthesia predelivery consultation should be obtained.  Hayley. Hayley Webb and her Mother were able to ask additional questions.  She is scheduled to return for a detailed exam at [redacted] weeks gestation. A fetal echocardiogram referral will be sent to [facility] for 18-20 weeks.   All questions answered.  I spent 60 minute with > 50% in face to face to consultation and care coordination.  I discussed today's consultation with Dr. Timoteo Gaul, MD.

## 2021-08-31 ENCOUNTER — Telehealth: Payer: Self-pay

## 2021-08-31 NOTE — Telephone Encounter (Signed)
Called the Lupus clinic in [place] - Dr. Dawayne Cirri - patient currently scheduled for 04/05/22 @ 9am - patient is currently due 03/20/22 was hoping she could be seen in the next few week - schedul

## 2021-09-01 ENCOUNTER — Encounter (HOSPITAL_COMMUNITY): Payer: Self-pay | Admitting: Emergency Medicine

## 2021-09-01 ENCOUNTER — Emergency Department (HOSPITAL_COMMUNITY)
Admission: EM | Admit: 2021-09-01 | Discharge: 2021-09-01 | Disposition: A | Discharge status: Home / Self Care | Payer: Medicaid Other | Attending: Emergency Medicine | Admitting: Emergency Medicine

## 2021-09-01 ENCOUNTER — Emergency Department (HOSPITAL_COMMUNITY): Payer: Medicaid Other

## 2021-09-01 ENCOUNTER — Other Ambulatory Visit: Payer: Self-pay

## 2021-09-01 DIAGNOSIS — Z20822 Contact with and (suspected) exposure to covid-19: Secondary | ICD-10-CM | POA: Diagnosis not present

## 2021-09-01 DIAGNOSIS — Z3A11 11 weeks gestation of pregnancy: Secondary | ICD-10-CM | POA: Insufficient documentation

## 2021-09-01 DIAGNOSIS — O99419 Diseases of the circulatory system complicating pregnancy, unspecified trimester: Secondary | ICD-10-CM | POA: Insufficient documentation

## 2021-09-01 DIAGNOSIS — Z79899 Other long term (current) drug therapy: Secondary | ICD-10-CM | POA: Insufficient documentation

## 2021-09-01 DIAGNOSIS — R079 Chest pain, unspecified: Secondary | ICD-10-CM | POA: Diagnosis not present

## 2021-09-01 DIAGNOSIS — O139 Gestational [pregnancy-induced] hypertension without significant proteinuria, unspecified trimester: Secondary | ICD-10-CM | POA: Diagnosis not present

## 2021-09-01 DIAGNOSIS — I10 Essential (primary) hypertension: Secondary | ICD-10-CM

## 2021-09-01 DIAGNOSIS — R0981 Nasal congestion: Secondary | ICD-10-CM | POA: Diagnosis not present

## 2021-09-01 DIAGNOSIS — O09891 Supervision of other high risk pregnancies, first trimester: Secondary | ICD-10-CM | POA: Insufficient documentation

## 2021-09-01 LAB — PROTEIN / CREATININE RATIO, URINE
Creatinine, Urine: 286.06 mg/dL
Protein Creatinine Ratio: 0.05 mg/mg{Cre} (ref 0.00–0.15)
Total Protein, Urine: 14 mg/dL

## 2021-09-01 LAB — HEPATIC FUNCTION PANEL
ALT: 17 U/L (ref 0–44)
AST: 14 U/L — ABNORMAL LOW (ref 15–41)
Albumin: 3.6 g/dL (ref 3.5–5.0)
Alkaline Phosphatase: 78 U/L (ref 38–126)
Bilirubin, Direct: <0.1 mg/dL (ref 0.0–0.2)
Total Bilirubin: 0.1 mg/dL — ABNORMAL LOW (ref 0.3–1.2)
Total Protein: 7.2 g/dL (ref 6.5–8.1)

## 2021-09-01 LAB — URINALYSIS, ROUTINE W REFLEX MICROSCOPIC
Bacteria, UA: NONE SEEN
Bilirubin Urine: NEGATIVE
Glucose, UA: NEGATIVE mg/dL
Ketones, ur: NEGATIVE mg/dL
Nitrite: NEGATIVE
Protein, ur: NEGATIVE mg/dL
Specific Gravity, Urine: 1.025 (ref 1.005–1.030)
pH: 5 (ref 5.0–8.0)

## 2021-09-01 LAB — RESP PANEL BY RT-PCR (FLU A&B, COVID) ARPGX2
Influenza A by PCR: NEGATIVE
Influenza B by PCR: NEGATIVE
SARS Coronavirus 2 by RT PCR: NEGATIVE

## 2021-09-01 LAB — BASIC METABOLIC PANEL
Anion gap: 9 (ref 5–15)
BUN: 10 mg/dL (ref 6–20)
CO2: 22 mmol/L (ref 22–32)
Calcium: 9 mg/dL (ref 8.9–10.3)
Chloride: 102 mmol/L (ref 98–111)
Creatinine, Ser: 0.7 mg/dL (ref 0.44–1.00)
GFR, Estimated: >60 mL/min (ref >60)
Glucose, Bld: 88 mg/dL (ref 70–99)
Potassium: 3.3 mmol/L — ABNORMAL LOW (ref 3.5–5.1)
Sodium: 133 mmol/L — ABNORMAL LOW (ref 135–145)

## 2021-09-01 LAB — CBC
HCT: 37.8 % (ref 36.0–46.0)
Hemoglobin: 12.4 g/dL (ref 12.0–15.0)
MCH: 28.4 pg (ref 26.0–34.0)
MCHC: 32.8 g/dL (ref 30.0–36.0)
MCV: 86.5 fL (ref 80.0–100.0)
Platelets: 361 10*3/uL (ref 150–400)
RBC: 4.37 MIL/uL (ref 3.87–5.11)
RDW: 13.8 % (ref 11.5–15.5)
WBC: 4.8 10*3/uL (ref 4.0–10.5)
nRBC: 0 % (ref 0.0–0.2)

## 2021-09-01 LAB — TROPONIN I (HIGH SENSITIVITY)
Troponin I (High Sensitivity): <2 ng/L (ref <18)
Troponin I (High Sensitivity): <2 ng/L (ref <18)

## 2021-09-01 LAB — CBG MONITORING, ED: Glucose-Capillary: 104 mg/dL — ABNORMAL HIGH (ref 70–99)

## 2021-09-01 LAB — I-STAT BETA HCG BLOOD, ED (MC, WL, AP ONLY): I-stat hCG, quantitative: >2000 m[IU]/mL — ABNORMAL HIGH (ref <5)

## 2021-09-01 NOTE — ED Provider Notes (Signed)
[facility]-EMERGENCY DEPT Provider Note   CSN: [id number] Arrival date & time: 09/01/21  1409     History  Chief Complaint  Patient presents with   Chest Pain    Hayley Webb is a 36 y.o. female.  HPI She presents for evaluation of chest pain, which started today.  She also complains of cold symptoms for 1 week with nasal congestion, sneezing and coughing.  She tested negative for COVID, 2 days ago at home.  She is currently [redacted] weeks pregnant.  She did not take her Lovenox for the last 4 days because of soreness of her abdomen.  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  Current pregnancy uncomplicated.  She follows up with her obstetrician and maternal-fetal medicine consultant, within the last week.  She denies bloody sputum, shortness of breath, weakness or dizziness.    Home Medications Prior to Admission medications   Medication Sig Start Date End Date Taking? Authorizing Provider  albuterol (PROVENTIL) (2.5 MG/3ML) 0.083% nebulizer solution Take 3 mLs (2.5 mg total) by nebulization every 6 (six) hours as needed for wheezing or shortness of breath. 03/04/20   Cathie Hoops, Amy V, PA-C  albuterol (VENTOLIN HFA) 108 (90 Base) MCG/ACT inhaler Inhale 2 puffs into the lungs every 6 (six) hours as needed for wheezing or shortness of breath. 07/27/21   Janeece Agee, NP  cholecalciferol (VITAMIN D3) 25 MCG (1000 UNIT) tablet Take 2,000 Units by mouth daily.    [provider]  doxylamine, Sleep, (UNISOM) 25 MG tablet Take 1 tablet (25 mg total) by mouth every 8 (eight) hours as needed. 08/03/21   Calvert Cantor, [name]  enoxaparin (LOVENOX) 150 MG/ML injection Inject 150 mg into the skin every 12 (twelve) hours.    [provider]  EPINEPHrine 0.1 MG/0.1ML SOAJ Inject 1 Dose as directed as needed (for anaphylaxis). 05/03/20   Janeece Agee, NP  hydroxychloroquine (PLAQUENIL) 200 MG tablet Take by mouth daily.    [provider]   Multiple Vitamin (MULTIVITAMIN) capsule Take 1 capsule by mouth daily.    [provider]  promethazine (PHENERGAN) 12.5 MG tablet Take 1 tablet (12.5 mg total) by mouth every 6 (six) hours as needed for nausea or vomiting. 08/03/21   Calvert Cantor, [name]  ferrous sulfate 325 (65 FE) MG tablet Take 1 tablet (325 mg total) by mouth 2 (two) times daily with a meal. 01/17/19 12/11/19  Dale [place], FNP      Allergies    Bactrim, Hydrocodone-acetaminophen, Nifedipine, Pineapple, Prednisone, Shellfish allergy, Sulfamethoxazole-trimethoprim, Nsaids, Other, and Vicodin [hydrocodone-acetaminophen]    Review of Systems   Review of Systems  Physical Exam Updated Vital Signs BP (!) 150/96    Pulse 87    Temp 97.7 F (36.5 C) (Oral)    Resp (!) 21    Ht 5\' 8"  (1.727 m)    Wt (!) 142.9 kg    LMP 06/04/2021    SpO2 100%    BMI 47.90 kg/m  Physical Exam Vitals and nursing note reviewed.  Constitutional:      General: She is not in acute distress.    Appearance: She is well-developed. She is obese. She is not ill-appearing or diaphoretic.  HENT:     Head: Normocephalic and atraumatic.     Right Ear: External ear normal. There is no impacted cerumen.     Left Ear: External ear normal.  Eyes:     Conjunctiva/sclera: Conjunctivae normal.  Pupils: Pupils are equal, round, and reactive to light.  Neck:     Trachea: Phonation normal.  Cardiovascular:     Rate and Rhythm: Normal rate and regular rhythm.     Heart sounds: Normal heart sounds.  Pulmonary:     Effort: Pulmonary effort is normal. No respiratory distress.     Breath sounds: Normal breath sounds. No stridor. No rhonchi.  Abdominal:     Palpations: Abdomen is soft.     Tenderness: There is no abdominal tenderness.  Musculoskeletal:        General: Normal range of motion.     Cervical back: Normal range of motion and neck supple.     Right lower leg: No edema.     Left lower leg: No edema.  Skin:    General: Skin is  warm and dry.  Neurological:     Mental Status: She is alert and oriented to person, place, and time.     Cranial Nerves: No cranial nerve deficit.     Sensory: No sensory deficit.     Motor: No abnormal muscle tone.     Coordination: Coordination normal.  Psychiatric:        Mood and Affect: Mood normal.        Behavior: Behavior normal.        Thought Content: Thought content normal.        Judgment: Judgment normal.    ED Results / Procedures / Treatments   Labs (all labs ordered are listed, but only abnormal results are displayed) Labs Reviewed  BASIC METABOLIC PANEL - Abnormal; Notable for the following components:      Result Value   Sodium 133 (*)    Potassium 3.3 (*)    All other components within normal limits  HEPATIC FUNCTION PANEL - Abnormal; Notable for the following components:   AST 14 (*)    Total Bilirubin 0.1 (*)    All other components within normal limits  URINALYSIS, ROUTINE W REFLEX MICROSCOPIC - Abnormal; Notable for the following components:   Hgb urine dipstick MODERATE (*)    Leukocytes,Ua TRACE (*)    All other components within normal limits  I-STAT BETA HCG BLOOD, ED ([facility], [facility], [facility] ONLY) - Abnormal; Notable for the following components:   I-stat hCG, quantitative >2,000.0 (*)    All other components within normal limits  RESP PANEL BY RT-PCR (FLU A&B, COVID) ARPGX2  CBC  PROTEIN / CREATININE RATIO, URINE  TROPONIN I (HIGH SENSITIVITY)  TROPONIN I (HIGH SENSITIVITY)    EKG EKG Interpretation  Date/Time:  Thursday September 01 2021 21:06:34 EST Ventricular Rate:  101 PR Interval:  148 QRS Duration: 86 QT Interval:  364 QTC Calculation: 471 R Axis:   15 Text Interpretation: Sinus tachycardia Cannot rule out Anterior infarct , age undetermined Abnormal ECG When compared with ECG of 12-Dec-2019 16:13, PREVIOUS ECG IS PRESENT since last tracing no significant change Confirmed by Mancel Bale [phone]) on 09/01/2021 9:29:18 PM  Radiology DG Chest  2 View  Result Date: 09/01/2021 CLINICAL DATA:  Chest pain, onset last night.  Flu-like symptoms. EXAM: CHEST - 2 VIEW COMPARISON:  12/11/2020 FINDINGS: The lungs appear clear. Cardiac and mediastinal contours normal. No pleural effusion identified. No significant osseous abnormality observed. IMPRESSION: No significant abnormality identified. Electronically Signed   By: Gaylyn Rong M.D.   On: 09/01/2021 15:03    Procedures Procedures    Medications Ordered in ED Medications - No data to display  ED Course/  Medical Decision Making/ A&P Clinical Course as of 09/02/21 0006  Thu Sep 01, 2021  2135 Cold for 1 week with weakness, sinus pressure and sore throat. Pain in left chest today, on and off, up to 30 min. No fever. Mild cough today with green sputum. No change in ongoing vomiting during this pregnancy. [EW]  2248 Urine protein negative [EW]  2344 Patient now states she is dizzy with a sensation of spinning, with walking.  She states that she had high blood pressure and her first pregnancy. [EW]    Clinical Course User Index [EW] Mancel Bale, MD                           Medical Decision Making Onset of respiratory symptoms, about a week ago, increased discomfort today with chest pain.  She is [redacted] weeks pregnant.  Amount and/or Complexity of Data Reviewed External Data Reviewed: labs and notes.    Details: Recent appointment with maternal-fetal medicine consultation physician, 1 week ago, at that time she was noted to have history of hypertension but was normotensive.  Recommended that she take a daily aspirin dose. Labs: ordered.    Details: CBC, metabolic panel, urinalysis.  Normal except sodium slightly low, potassium low, AST low, trace leukocytes in urine.  Urine protein negative. Radiology: ordered.    Details: Chest x-ray-no infiltrate or edema ECG/medicine tests: ordered.    Details: Cardiac monitor with normal sinus rhythm.  EKG reviewed. Discussion of management  or test interpretation with external provider(s): Discussed with on-call obstetrician who recommends check urine protein creatinine ratio.  If this is negative the patient can be discharged to follow-up with her obstetrician.  Risk Risk Details: Nonspecific symptoms of an patient with URI symptoms.  Suspect viral syndrome.  Mild hypertension present.  Doubt recurrence of significant PE associated with a brief cessation of use of Lovenox over the last 4 days.  Patient encouraged to restart her Lovenox as soon as possible.  She is to follow-up with her obstetrician for further evaluation.           Final Clinical Impression(s) / ED Diagnoses Final diagnoses:  None    Rx / DC Orders ED Discharge Orders     None         Mancel Bale, MD 09/02/21 [phone]

## 2021-09-01 NOTE — ED Triage Notes (Addendum)
Patient BIBA from home c/o chest pain and SOB since 1000 today. She also reports cold-like symptoms x1 week. Hx PE in the past. Pt currently [redacted] weeks pregnant.  Bp 170/142 Cbg 112 HR 84 NSR

## 2021-09-01 NOTE — Discharge Instructions (Signed)
Call your obstetrician, tomorrow morning to report high blood pressure.  Make sure you sit down if you feel dizzy.  Try to eat and drink regularly.  Return here, if needed.

## 2021-09-01 NOTE — ED Provider Triage Note (Signed)
Emergency Medicine Provider Triage Evaluation Note  Hayley Webb , a 36 y.o. female  was evaluated in triage.  Pt complains of chest pain with onset last night.  Patient reports that she has been having flulike symptoms since Thursday, this is progressed into chest pains last night while sleeping.  She stated that she was on her right side and she began experiencing chest pain and left-sided arm pain.  Patient has history of blood clots, is currently anticoagulated on Lovenox.  She reports that she had had 3 blood clots in 2018.  She is currently [redacted] weeks pregnant.  She reports being recently bedbound due to pregnancy.  Review of Systems  Positive: Chest pain, shortness of breath, body aches, weakness Negative: Fevers, nausea, vomiting, lightheadedness  Physical Exam  BP (!) 150/98 (BP Location: Right Arm)    Pulse 81    Temp 97.7 F (36.5 C) (Oral)    Resp 18    LMP 06/04/2021    SpO2 100%  Gen:   Awake, no distress   Resp:  Normal effort  MSK:   Moves extremities without difficulty  Other:    Medical Decision Making  Medically screening exam initiated at 2:32 PM.  Appropriate orders placed.  Lyndy Russman was informed that the remainder of the evaluation will be completed by another provider, this initial triage assessment does not replace that evaluation, and the importance of remaining in the ED until their evaluation is complete.     Al Decant, PA-C 09/01/21 1433

## 2021-09-02 ENCOUNTER — Telehealth: Payer: Self-pay | Admitting: Registered Nurse

## 2021-09-02 ENCOUNTER — Telehealth: Payer: Self-pay

## 2021-09-02 NOTE — Telephone Encounter (Signed)
Transition Care Management Unsuccessful Follow-up Telephone Call  Date of discharge and from where:  09/01/2021 from [facility]  Attempts:  1st Attempt  Reason for unsuccessful TCM follow-up call:  Unable to leave message

## 2021-09-02 NOTE — Telephone Encounter (Signed)
Chief Complaint CHEST PAIN - pain, pressure, heaviness or tightness Reason for Call Symptomatic / Request for Health Information Initial Comment Caller states that she has chest pain. Translation No Nurse Assessment Nurse: Elesa Hacker, RN, Nash Dimmer Date/Time Lamount Cohen Time): 09/01/2021 12:55:37 PM Confirm and document reason for call. If symptomatic, describe symptoms. ---Caller states that she has chest pain, caller has been also having SOB. Caller advised that she has been sick since Thursday with cold sx. No temp. Cough, productive, green, gray mucous. Does the patient have any new or worsening symptoms? ---Yes Will a triage be completed? ---Yes Related visit to physician within the last 2 weeks? ---No Does the PT have any chronic conditions? (i.e. diabetes, asthma, this includes High risk factors for pregnancy, etc.) ---Yes List chronic conditions. ---Asthma Lupus hx of Blood clots, 3 pulmonray embolism, taking Lovenox. Is the patient pregnant or possibly pregnant? (Ask all females between the ages of 13-55) ---Yes How many weeks gestation? ---27 What is the estimated delivery date? ---2022-03-21 Total number of pregnancies including current? ---4 Number of live births? ---2 Have you felt decreased fetal movement? ---Early Pregnancy - No Fetal Movement Felt Yet Is this a behavioral health or substance abuse call? ---No PLEASE NOTE: All timestamps contained within this report are represented as [place] Standard Time. CONFIDENTIALTY NOTICE: This fax transmission is intended only for the addressee. It contains information that is legally privileged, confidential or otherwise protected from use or disclosure. If you are not the intended recipient, you are strictly prohibited from reviewing, disclosing, copying using or disseminating any of this information or taking any action in reliance on or regarding this information. If you have received this fax in error, please notify us immediately by  telephone so that we can arrange for its return to [place]. Phone: [phone], Toll-Free: [phone], Fax: [phone] Page: 2 of 2 Call Id: [id number] Guidelines Guideline Title Affirmed Question Affirmed Notes Nurse Date/Time Lamount Cohen Time) Cough - Acute Productive SEVERE difficulty breathing (e.g., struggling for each breath, speaks in single words) Deaton, RN, Nash Dimmer 09/01/2021 12:58:12 PM Disp. Time Lamount Cohen Time) Disposition Final User 09/01/2021 12:54:24 PM Send to Urgent Queue Brooke Pace 09/01/2021 1:05:36 PM 911 Outcome Documentation Deaton, RN, Nash Dimmer Reason: EMS is on the way. 09/01/2021 12:59:41 PM Call EMS 911 Now Yes Deaton, RN, Cory Roughen Disagree/Comply Comply Caller Understands Yes PreDisposition Did not know what to do Care Advice Given Per Guideline CALL EMS 911 NOW: * Immediate medical attention is needed. You need to hang up and call 911 (or an ambulance). * Triager Discretion: I'll call you back in a few minutes to be sure you were able to reach them. CARE ADVICE given per Cough - Acute Productive (Adult) guideline

## 2021-09-02 NOTE — Telephone Encounter (Signed)
Went to ED yesterday 

## 2021-09-05 ENCOUNTER — Telehealth: Payer: Self-pay | Admitting: *Deleted

## 2021-09-05 NOTE — Telephone Encounter (Signed)
Noted  Thanks,  Rich

## 2021-09-06 ENCOUNTER — Telehealth: Payer: Self-pay

## 2021-09-06 NOTE — Telephone Encounter (Signed)
Transition Care Management Unsuccessful Follow-up Telephone Call  Date of discharge and from where:  09/01/2021 from [facility]  Attempts:  2nd Attempt  Reason for unsuccessful TCM follow-up call:  Unable to leave message

## 2021-09-07 ENCOUNTER — Telehealth: Payer: Self-pay | Admitting: *Deleted

## 2021-09-07 NOTE — Telephone Encounter (Signed)
Transition Care Management Unsuccessful Follow-up Telephone Call  Date of discharge and from where:  09/01/2021 - [facility]  Attempts:  3rd Attempt  Reason for unsuccessful TCM follow-up call:  Unable to leave message

## 2021-09-08 ENCOUNTER — Ambulatory Visit: Payer: Medicaid Other | Admitting: Registered Nurse

## 2021-09-09 ENCOUNTER — Encounter: Payer: Self-pay | Admitting: Registered Nurse

## 2021-09-09 ENCOUNTER — Other Ambulatory Visit: Payer: Self-pay

## 2021-09-09 ENCOUNTER — Ambulatory Visit: Payer: Medicaid Other | Admitting: Registered Nurse

## 2021-09-09 VITALS — BP 135/96 | HR 90 | Temp 98.0°F | Resp 18 | Ht 68.0 in | Wt 320.0 lb

## 2021-09-09 DIAGNOSIS — J019 Acute sinusitis, unspecified: Secondary | ICD-10-CM

## 2021-09-09 DIAGNOSIS — B9689 Other specified bacterial agents as the cause of diseases classified elsewhere: Secondary | ICD-10-CM | POA: Diagnosis not present

## 2021-09-09 MED ORDER — AMOXICILLIN-POT CLAVULANATE 875-125 MG PO TABS: 1.0000 | ORAL_TABLET | Freq: Two times a day (BID) | ORAL | 0 refills | Status: DC

## 2021-09-09 NOTE — Patient Instructions (Signed)
[name] to see you, sorry it was so soon after last time!  I have sent in augmentin  Call if not effective by Tuesday  Ok to use saline sinus rinse  Thank you  Rich

## 2021-09-09 NOTE — Progress Notes (Signed)
Established Patient Office Visit  Subjective:  Patient ID: Hayley Webb, female    DOB: [birth year]  Age: 36 y.o. MRN: [id number]  CC:  Chief Complaint  Patient presents with   Nasal Congestion    Patient states she has been having a cold for 2 weeks that has not went away. Patient states she went to [place] and had a covid and flu test and both were negative     HPI Hayley Webb presents for nasal congestion  Ongoing x 2 weeks Has pursued conservative measures with limited effect Pnd,  sore throat, cough from pnd  Denies visual changes, chest pain, shob, doe, palpitations, other symptoms.   Past Medical History:  Diagnosis Date   Anxiety    doing good now   Asthma    Depression    doing good   Headache(784.0)    HSV infection    Hypertension    was taken off meds after last preg   Infection    UTI   Lupus [facility])    dx age 50   Pulmonary embolism (HCC) 06/4/82011   STD (sexually transmitted disease) 02/2009   POSITIVE GC   UTI (urinary tract infection)     Past Surgical History:  Procedure Laterality Date   ADENOIDECTOMY     TONSILLECTOMY AND ADENOIDECTOMY  1998   WISDOM TOOTH EXTRACTION      Family History  Problem Relation Age of Onset   Asthma Mother    Healthy Father    Asthma Brother    Diabetes Maternal Aunt    Lupus Paternal Aunt    Stroke Paternal Aunt    Cancer Maternal Grandmother 52       COLON CA   Diabetes Maternal Grandmother    Hypertension Maternal Grandfather    Cancer Maternal Grandfather        prostate   Cancer Paternal Grandmother        breast   Lupus Paternal Grandmother    Anesthesia problems Neg Hx    Hypotension Neg Hx    Malignant hyperthermia Neg Hx    Pseudochol deficiency Neg Hx     Social History   Socioeconomic History   Marital status: Married    Spouse name: Not on file   Number of children: Not on file   Years of education: Not on file   Highest education level: Not on file  Occupational History    Not on file  Tobacco Use   Smoking status: Never   Smokeless tobacco: Never  Vaping Use   Vaping Use: Never used  Substance and Sexual Activity   Alcohol use: No   Drug use: No   Sexual activity: Not Currently    Partners: Male    Birth control/protection: None  Other Topics Concern   Not on file  Social History Narrative   Not on file   Social Determinants of Health   Financial Resource Strain: Not on file  Food Insecurity: Not on file  Transportation Needs: Not on file  Physical Activity: Not on file  Stress: Not on file  Social Connections: Not on file  Intimate Partner Violence: Not on file    Outpatient Medications Prior to Visit  Medication Sig Dispense Refill   albuterol (PROVENTIL) (2.5 MG/3ML) 0.083% nebulizer solution Take 3 mLs (2.5 mg total) by nebulization every 6 (six) hours as needed for wheezing or shortness of breath. 75 mL 0   albuterol (VENTOLIN HFA) 108 (90 Base) MCG/ACT inhaler Inhale 2 puffs  into the lungs every 6 (six) hours as needed for wheezing or shortness of breath. 18 g 0   cholecalciferol (VITAMIN D3) 25 MCG (1000 UNIT) tablet Take 2,000 Units by mouth daily.     doxylamine, Sleep, (UNISOM) 25 MG tablet Take 1 tablet (25 mg total) by mouth every 8 (eight) hours as needed. 30 tablet 0   enoxaparin (LOVENOX) 150 MG/ML injection Inject 150 mg into the skin every 12 (twelve) hours.     EPINEPHrine 0.1 MG/0.1ML SOAJ Inject 1 Dose as directed as needed (for anaphylaxis). 2 each 2   hydroxychloroquine (PLAQUENIL) 200 MG tablet Take by mouth daily.     labetalol (NORMODYNE) 100 MG tablet Take 100 mg by mouth 2 (two) times daily.     Multiple Vitamin (MULTIVITAMIN) capsule Take 1 capsule by mouth daily.     promethazine (PHENERGAN) 12.5 MG tablet Take 1 tablet (12.5 mg total) by mouth every 6 (six) hours as needed for nausea or vomiting. 30 tablet 0   No facility-administered medications prior to visit.    Allergies  Allergen Reactions   Bactrim  Anaphylaxis and Hives   Hydrocodone-Acetaminophen Anaphylaxis   Nifedipine Swelling and Anaphylaxis    Swelling of tongue   Pineapple Itching   Prednisone Anaphylaxis and Hives    Has been on Dexamethasone without issue.   Shellfish Allergy Itching   Sulfamethoxazole-Trimethoprim Other (See Comments)    Complications due to lupus   Nsaids Other (See Comments)    Has Lupus, reccommended to avoid NSAIDs   Other Itching    Shrimp:throat itching "can eat other shellfish"   Vicodin [Hydrocodone-Acetaminophen] Hives and Swelling    Can take Percocet and Tylenol without reaction.    ROS Review of Systems Per hpi     Objective:    Physical Exam Vitals and nursing note reviewed.  Constitutional:      General: She is not in acute distress.    Appearance: Normal appearance. She is normal weight. She is not ill-appearing, toxic-appearing or diaphoretic.  Cardiovascular:     Rate and Rhythm: Normal rate and regular rhythm.     Heart sounds: Normal heart sounds. No murmur heard.   No friction rub. No gallop.  Pulmonary:     Effort: Pulmonary effort is normal. No respiratory distress.     Breath sounds: Normal breath sounds. No stridor. No wheezing, rhonchi or rales.  Chest:     Chest wall: No tenderness.  Skin:    General: Skin is warm and dry.  Neurological:     General: No focal deficit present.     Mental Status: She is alert and oriented to person, place, and time. Mental status is at baseline.  Psychiatric:        Mood and Affect: Mood normal.        Behavior: Behavior normal.        Thought Content: Thought content normal.        Judgment: Judgment normal.    BP (!) 135/96    Pulse 90    Temp 98 F (36.7 C) (Temporal)    Resp 18    Ht 5\' 8"  (1.727 m)    Wt (!) 320 lb (145.2 kg)    LMP 06/04/2021    BMI 48.66 kg/m  Wt Readings from Last 3 Encounters:  09/09/21 (!) 320 lb (145.2 kg)  09/01/21 (!) 315 lb (142.9 kg)  08/03/21 (!) 313 lb 12.8 oz (142.3 kg)     Health  Maintenance Due  Topic Date Due   COVID-19 Vaccine (1) Never done   PAP SMEAR-Modifier  10/25/2019   INFLUENZA VACCINE  02/21/2021    There are no preventive care reminders to display for this patient.  Lab Results  Component Value Date   TSH 0.958 05/03/2020   Lab Results  Component Value Date   WBC 4.8 09/01/2021   HGB 12.4 09/01/2021   HCT 37.8 09/01/2021   MCV 86.5 09/01/2021   PLT 361 09/01/2021   Lab Results  Component Value Date   NA 133 (L) 09/01/2021   K 3.3 (L) 09/01/2021   CO2 22 09/01/2021   GLUCOSE 88 09/01/2021   BUN 10 09/01/2021   CREATININE 0.70 09/01/2021   BILITOT 0.1 (L) 09/01/2021   ALKPHOS 78 09/01/2021   AST 14 (L) 09/01/2021   ALT 17 09/01/2021   PROT 7.2 09/01/2021   ALBUMIN 3.6 09/01/2021   CALCIUM 9.0 09/01/2021   ANIONGAP 9 09/01/2021   Lab Results  Component Value Date   CHOL 203 (H) 05/03/2020   Lab Results  Component Value Date   HDL 50 05/03/2020   Lab Results  Component Value Date   LDLCALC 142 (H) 05/03/2020   Lab Results  Component Value Date   TRIG 62 05/03/2020   Lab Results  Component Value Date   CHOLHDL 4.1 05/03/2020   Lab Results  Component Value Date   HGBA1C 5.6 05/03/2020      Assessment & Plan:   Problem List Items Addressed This Visit   None Visit Diagnoses     Acute bacterial sinusitis    -  Primary   Relevant Medications   amoxicillin-clavulanate (AUGMENTIN) 875-125 MG tablet       Meds ordered this encounter  Medications   amoxicillin-clavulanate (AUGMENTIN) 875-125 MG tablet    Sig: Take 1 tablet by mouth 2 (two) times daily.    Dispense:  14 tablet    Refill:  0    Order Specific Question:   Supervising Provider    Answer:   Neva Seat, JEFFREY R [[id number]]    Follow-up: Return if symptoms worsen or fail to improve.   PLAN Augmentin po bid x 7 days Reviewed potential risks for pt Return if worsening or failing to improve Patient encouraged to call clinic with any questions,  comments, or concerns.  Janeece Agee, NP

## 2021-09-12 DIAGNOSIS — Z315 Encounter for genetic counseling: Secondary | ICD-10-CM | POA: Diagnosis not present

## 2021-09-12 DIAGNOSIS — O09521 Supervision of elderly multigravida, first trimester: Secondary | ICD-10-CM | POA: Diagnosis not present

## 2021-09-15 ENCOUNTER — Other Ambulatory Visit: Payer: Self-pay

## 2021-09-15 ENCOUNTER — Inpatient Hospital Stay (HOSPITAL_COMMUNITY)
Admission: AD | Admit: 2021-09-15 | Discharge: 2021-09-15 | Disposition: A | Discharge status: Home / Self Care | Payer: Medicaid Other | Attending: Obstetrics & Gynecology | Admitting: Obstetrics & Gynecology

## 2021-09-15 ENCOUNTER — Encounter (HOSPITAL_COMMUNITY): Payer: Self-pay | Admitting: Obstetrics & Gynecology

## 2021-09-15 DIAGNOSIS — O26891 Other specified pregnancy related conditions, first trimester: Secondary | ICD-10-CM | POA: Diagnosis not present

## 2021-09-15 DIAGNOSIS — O418X10 Other specified disorders of amniotic fluid and membranes, first trimester, not applicable or unspecified: Secondary | ICD-10-CM

## 2021-09-15 DIAGNOSIS — O10919 Unspecified pre-existing hypertension complicating pregnancy, unspecified trimester: Secondary | ICD-10-CM | POA: Diagnosis not present

## 2021-09-15 DIAGNOSIS — O26892 Other specified pregnancy related conditions, second trimester: Secondary | ICD-10-CM | POA: Diagnosis not present

## 2021-09-15 DIAGNOSIS — Z3A13 13 weeks gestation of pregnancy: Secondary | ICD-10-CM

## 2021-09-15 DIAGNOSIS — Z79899 Other long term (current) drug therapy: Secondary | ICD-10-CM | POA: Insufficient documentation

## 2021-09-15 DIAGNOSIS — Z86711 Personal history of pulmonary embolism: Secondary | ICD-10-CM | POA: Insufficient documentation

## 2021-09-15 DIAGNOSIS — O99891 Other specified diseases and conditions complicating pregnancy: Secondary | ICD-10-CM

## 2021-09-15 DIAGNOSIS — R109 Unspecified abdominal pain: Secondary | ICD-10-CM

## 2021-09-15 DIAGNOSIS — O10913 Unspecified pre-existing hypertension complicating pregnancy, third trimester: Secondary | ICD-10-CM

## 2021-09-15 DIAGNOSIS — O10912 Unspecified pre-existing hypertension complicating pregnancy, second trimester: Secondary | ICD-10-CM | POA: Diagnosis not present

## 2021-09-15 DIAGNOSIS — M329 Systemic lupus erythematosus, unspecified: Secondary | ICD-10-CM | POA: Insufficient documentation

## 2021-09-15 DIAGNOSIS — O208 Other hemorrhage in early pregnancy: Secondary | ICD-10-CM | POA: Diagnosis not present

## 2021-09-15 DIAGNOSIS — R102 Pelvic and perineal pain: Secondary | ICD-10-CM | POA: Insufficient documentation

## 2021-09-15 DIAGNOSIS — Z7901 Long term (current) use of anticoagulants: Secondary | ICD-10-CM | POA: Insufficient documentation

## 2021-09-15 DIAGNOSIS — M549 Dorsalgia, unspecified: Secondary | ICD-10-CM

## 2021-09-15 LAB — URINALYSIS, ROUTINE W REFLEX MICROSCOPIC
Bilirubin Urine: NEGATIVE
Glucose, UA: NEGATIVE mg/dL
Ketones, ur: NEGATIVE mg/dL
Leukocytes,Ua: NEGATIVE
Nitrite: NEGATIVE
Protein, ur: NEGATIVE mg/dL
Specific Gravity, Urine: 1.027 (ref 1.005–1.030)
pH: 6 (ref 5.0–8.0)

## 2021-09-15 LAB — WET PREP, GENITAL
Clue Cells Wet Prep HPF POC: NONE SEEN
Sperm: NONE SEEN
Trich, Wet Prep: NONE SEEN
WBC, Wet Prep HPF POC: >=10 — AB (ref <10)
Yeast Wet Prep HPF POC: NONE SEEN

## 2021-09-15 MED ORDER — LABETALOL HCL 100 MG PO TABS: 200.0000 mg | ORAL_TABLET | Freq: Two times a day (BID) | ORAL | 5 refills | Status: DC

## 2021-09-15 MED ORDER — CYCLOBENZAPRINE HCL 10 MG PO TABS: 10.0000 mg | ORAL_TABLET | Freq: Two times a day (BID) | ORAL | 0 refills | Status: DC | PRN

## 2021-09-15 NOTE — MAU Note (Signed)
...  Hayley Webb is a 36 y.o. at [redacted]w[redacted]d here in MAU reporting: Intermittent lower abdomen, lower back, and pelvic pain since 1000 yesterday. Last IC last Saturday. Denies VB, LOF, vaginal odors, and vaginal itching. Discharge has been clear/white and thin. Denies urinary frequency and burning with urination.  Last doses: Tylenol 1000 mg at 2000 last night Labetalol 100 mg at 0700 Lovenox 150 mg at 0700  Pain scores: 8/10 lower abdomen 8/10 pelvis 9/10 back  FHT: 160 doppler Lab orders placed from triage: UA

## 2021-09-15 NOTE — MAU Provider Note (Signed)
History     [id number]  Arrival date and time: 09/15/21 0846    Chief Complaint  Patient presents with   Abdominal Pain   Back Pain   Pelvic Pain     HPI Hayley Webb is a 36 y.o. 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   Patient reports that starting around 1000 yesterday morning she has had intermittent cramping and back pain Mostly located centrally, does not hurt more on one side than the other Came to MAU because it has woken her up out of sleep She has not experienced something similar in the past Besides that has no associated symptoms Denies burning or pain with urination, vaginal discharge, leaking fluid, diarrhea, fever, or flank pain [place] she has had blood in her urine related to her lupus diagnosis but no history of kidney stones that she is aware of Reports she has not had any hx of preterm labor On review of chart it appears she was admitted for this in 12/2018 but remained 2 cm for several days and was subsequently discharged Also had recent US that showed a small [facility] on 08/24/2021     OB History     Gravida  4   Para  2   Term  1   Preterm  1   AB  1   Living  2      SAB  1   IAB      Ectopic      Multiple  0   Live Births  2           Past Medical History:  Diagnosis Date   Anxiety    doing good now   Asthma    Depression    doing good   Headache(784.0)    HSV infection    Hypertension    was taken off meds after last preg   Infection    UTI   Lupus (HCC)    dx age 55   Pulmonary embolism (HCC) 06/4/82011   STD (sexually transmitted disease) 02/2009   POSITIVE GC   UTI (urinary tract infection)     Past Surgical History:  Procedure Laterality Date   ADENOIDECTOMY     TONSILLECTOMY AND ADENOIDECTOMY  1998   WISDOM TOOTH EXTRACTION      Family History  Problem Relation Age of Onset   Asthma  Mother    Healthy Father    Asthma Brother    Diabetes Maternal Aunt    Lupus Paternal Aunt    Stroke Paternal Aunt    Cancer Maternal Grandmother 72       COLON CA   Diabetes Maternal Grandmother    Hypertension Maternal Grandfather    Cancer Maternal Grandfather        prostate   Cancer Paternal Grandmother        breast   Lupus Paternal Grandmother    Anesthesia problems Neg Hx    Hypotension Neg Hx    Malignant hyperthermia Neg Hx    Pseudochol deficiency Neg Hx     Social History   Socioeconomic History   Marital status: Married    Spouse name: Not on file   Number of children: Not on file   Years of education: Not on file   Highest education level: Not on file  Occupational History   Not on file  Tobacco Use  Smoking status: Never   Smokeless tobacco: Never  Vaping Use   Vaping Use: Never used  Substance and Sexual Activity   Alcohol use: No   Drug use: No   Sexual activity: Yes    Partners: Male    Birth control/protection: None  Other Topics Concern   Not on file  Social History Narrative   Not on file   Social Determinants of Health   Financial Resource Strain: Not on file  Food Insecurity: Not on file  Transportation Needs: Not on file  Physical Activity: Not on file  Stress: Not on file  Social Connections: Not on file  Intimate Partner Violence: Not on file    Allergies  Allergen Reactions   Bactrim Anaphylaxis and Hives   Hydrocodone-Acetaminophen Anaphylaxis   Nifedipine Swelling and Anaphylaxis    Swelling of tongue   Pineapple Itching   Prednisone Anaphylaxis and Hives    Has been on Dexamethasone without issue.   Shellfish Allergy Itching   Sulfamethoxazole-Trimethoprim Other (See Comments)    Complications due to lupus   Nsaids Other (See Comments)    Has Lupus, reccommended to avoid NSAIDs   Other Itching    Shrimp:throat itching "can eat other shellfish"   Vicodin [Hydrocodone-Acetaminophen] Hives and Swelling    Can  take Percocet and Tylenol without reaction.    No current facility-administered medications on file prior to encounter.   Current Outpatient Medications on File Prior to Encounter  Medication Sig Dispense Refill   albuterol (VENTOLIN HFA) 108 (90 Base) MCG/ACT inhaler Inhale 2 puffs into the lungs every 6 (six) hours as needed for wheezing or shortness of breath. 18 g 0   amoxicillin-clavulanate (AUGMENTIN) 875-125 MG tablet Take 1 tablet by mouth 2 (two) times daily. 14 tablet 0   cholecalciferol (VITAMIN D3) 25 MCG (1000 UNIT) tablet Take 2,000 Units by mouth daily.     doxylamine, Sleep, (UNISOM) 25 MG tablet Take 1 tablet (25 mg total) by mouth every 8 (eight) hours as needed. 30 tablet 0   enoxaparin (LOVENOX) 150 MG/ML injection Inject 150 mg into the skin every 12 (twelve) hours.     hydroxychloroquine (PLAQUENIL) 200 MG tablet Take by mouth daily.     Multiple Vitamin (MULTIVITAMIN) capsule Take 1 capsule by mouth daily.     promethazine (PHENERGAN) 12.5 MG tablet Take 1 tablet (12.5 mg total) by mouth every 6 (six) hours as needed for nausea or vomiting. 30 tablet 0   albuterol (PROVENTIL) (2.5 MG/3ML) 0.083% nebulizer solution Take 3 mLs (2.5 mg total) by nebulization every 6 (six) hours as needed for wheezing or shortness of breath. 75 mL 0   EPINEPHrine 0.1 MG/0.1ML SOAJ Inject 1 Dose as directed as needed (for anaphylaxis). 2 each 2   [DISCONTINUED] ferrous sulfate 325 (65 FE) MG tablet Take 1 tablet (325 mg total) by mouth 2 (two) times daily with a meal. 60 tablet 3     ROS Pertinent positives and negative per HPI, all others reviewed and negative  Physical Exam   BP (!) 138/97    Pulse 98    Temp 98.1 F (36.7 C) (Oral)    Resp 17    Ht 5' 8.5" (1.74 m)    Wt (!) 146.6 kg    LMP 06/04/2021    SpO2 100%    BMI 48.41 kg/m   Patient Vitals for the past 24 hrs:  BP Temp Temp src Pulse Resp SpO2 Height Weight  09/15/21 0926 (!) 138/97 -- --  98 -- -- -- --  09/15/21 0910  (!) 146/93 98.1 F (36.7 C) Oral -- 17 100 % -- --  09/15/21 0907 -- -- -- -- -- -- 5' 8.5" (1.74 m) (!) 146.6 kg    Physical Exam Vitals reviewed.  Constitutional:      General: She is not in acute distress.    Appearance: She is well-developed. She is not diaphoretic.  Eyes:     General: No scleral icterus. Pulmonary:     Effort: Pulmonary effort is normal. No respiratory distress.  Abdominal:     General: There is no distension.     Palpations: Abdomen is soft.     Tenderness: There is no abdominal tenderness. There is no guarding or rebound.  Genitourinary:    Vagina: No vaginal discharge, erythema or bleeding.     Cervix: No discharge.     Comments: Small cervical polyp at 5 o clock Skin:    General: Skin is warm and dry.  Neurological:     Mental Status: She is alert.     Coordination: Coordination normal.     Cervical Exam    Bedside Ultrasound Not done  My interpretation: n/a  FHT FHR 160 bpm by doppler  Labs Results for orders placed or performed during the hospital encounter of 09/15/21 (from the past 24 hour(s))  Urinalysis, Routine w reflex microscopic Urine, Clean Catch     Status: Abnormal   Collection Time: 09/15/21  9:10 AM  Result Value Ref Range   Color, Urine AMBER (A) YELLOW   APPearance HAZY (A) CLEAR   Specific Gravity, Urine 1.027 1.005 - 1.030   pH 6.0 5.0 - 8.0   Glucose, UA NEGATIVE NEGATIVE mg/dL   Hgb urine dipstick SMALL (A) NEGATIVE   Bilirubin Urine NEGATIVE NEGATIVE   Ketones, ur NEGATIVE NEGATIVE mg/dL   Protein, ur NEGATIVE NEGATIVE mg/dL   Nitrite NEGATIVE NEGATIVE   Leukocytes,Ua NEGATIVE NEGATIVE   RBC / HPF 0-5 0 - 5 RBC/hpf   WBC, UA 0-5 0 - 5 WBC/hpf   Bacteria, UA RARE (A) NONE SEEN   Squamous Epithelial / LPF 0-5 0 - 5   Mucus PRESENT   Wet prep, genital     Status: Abnormal   Collection Time: 09/15/21  9:49 AM   Specimen: PATH Cytology Cervicovaginal Ancillary Only  Result Value Ref Range   Yeast Wet Prep HPF  POC NONE SEEN NONE SEEN   Trich, Wet Prep NONE SEEN NONE SEEN   Clue Cells Wet Prep HPF POC NONE SEEN NONE SEEN   WBC, Wet Prep HPF POC >=10 (A) <10   Sperm NONE SEEN     Imaging No results found.  MAU Course  Procedures Lab Orders         Wet prep, genital         Culture, OB Urine         Urinalysis, Routine w reflex microscopic Urine, Clean Catch    Meds ordered this encounter  Medications   labetalol (NORMODYNE) 100 MG tablet    Sig: Take 2 tablets (200 mg total) by mouth 2 (two) times daily.    Dispense:  120 tablet    Refill:  5   cyclobenzaprine (FLEXERIL) 10 MG tablet    Sig: Take 1 tablet (10 mg total) by mouth 2 (two) times daily as needed for muscle spasms.    Dispense:  20 tablet    Refill:  0   Imaging Orders  No imaging studies  ordered today    MDM moderate  Assessment and Plan  #Abdominal pain in pregnancy, second trimester #Back pain in pregnancy, second trimester #[redacted] weeks gestation of pregnancy #Subchorionic hematoma One day history of lower abdominal and back pain. UA unremarkable. Wet prep negative for infection, GC/CT pending. Abdominal exam benign. Cervix closed. Overall no clear cause for her discomfort. Possibly related to her [facility] seen on [place] earlier this month, definitely at higher risk for new one or expansion of old one given she is on full dose anticoagulation and this could be causing some of her symptoms. Otherwise hemodynamically stable with no other concerning findings. Trial flexeril as an outpatient for symptoms, routine return precautions and will follow up on swabs and urine culture.   #Chronic hypertension Poorly controlled, currently on 100mg  BID of labetalol. Will increase to 200mg  BID.   #FWB FHR 160 bpm by doppler   Dispo: discharged to home in stable condition.   Venora Maples, MD/MPH 09/15/21 10:18 AM  Allergies as of 09/15/2021       Reactions   Bactrim Anaphylaxis, Hives   Hydrocodone-acetaminophen Anaphylaxis    Nifedipine Swelling, Anaphylaxis   Swelling of tongue   Pineapple Itching   Prednisone Anaphylaxis, Hives   Has been on Dexamethasone without issue.   Shellfish Allergy Itching   Sulfamethoxazole-trimethoprim Other (See Comments)   Complications due to lupus   Nsaids Other (See Comments)   Has Lupus, reccommended to avoid NSAIDs   Other Itching   Shrimp:throat itching "can eat other shellfish"   Vicodin [hydrocodone-acetaminophen] Hives, Swelling   Can take Percocet and Tylenol without reaction.        Medication List     TAKE these medications    albuterol (2.5 MG/3ML) 0.083% nebulizer solution Commonly known as: PROVENTIL Take 3 mLs (2.5 mg total) by nebulization every 6 (six) hours as needed for wheezing or shortness of breath.   albuterol 108 (90 Base) MCG/ACT inhaler Commonly known as: VENTOLIN HFA Inhale 2 puffs into the lungs every 6 (six) hours as needed for wheezing or shortness of breath.   amoxicillin-clavulanate 875-125 MG tablet Commonly known as: AUGMENTIN Take 1 tablet by mouth 2 (two) times daily.   cholecalciferol 25 MCG (1000 UNIT) tablet Commonly known as: VITAMIN D3 Take 2,000 Units by mouth daily.   cyclobenzaprine 10 MG tablet Commonly known as: FLEXERIL Take 1 tablet (10 mg total) by mouth 2 (two) times daily as needed for muscle spasms.   doxylamine (Sleep) 25 MG tablet Commonly known as: UNISOM Take 1 tablet (25 mg total) by mouth every 8 (eight) hours as needed.   enoxaparin 150 MG/ML injection Commonly known as: LOVENOX Inject 150 mg into the skin every 12 (twelve) hours.   EPINEPHrine 0.1 MG/0.1ML Soaj Inject 1 Dose as directed as needed (for anaphylaxis).   hydroxychloroquine 200 MG tablet Commonly known as: PLAQUENIL Take by mouth daily.   labetalol 100 MG tablet Commonly known as: NORMODYNE Take 2 tablets (200 mg total) by mouth 2 (two) times daily. What changed: how much to take   multivitamin capsule Take 1 capsule  by mouth daily.   promethazine 12.5 MG tablet Commonly known as: PHENERGAN Take 1 tablet (12.5 mg total) by mouth every 6 (six) hours as needed for nausea or vomiting.

## 2021-09-16 LAB — CULTURE, OB URINE: Culture: NO GROWTH

## 2021-09-16 LAB — GC/CHLAMYDIA PROBE AMP (~~LOC~~) NOT AT ARMC
Chlamydia: NEGATIVE
Comment: NEGATIVE
Comment: NORMAL
Neisseria Gonorrhea: NEGATIVE

## 2021-10-04 DIAGNOSIS — Z113 Encounter for screening for infections with a predominantly sexual mode of transmission: Secondary | ICD-10-CM | POA: Diagnosis not present

## 2021-10-04 DIAGNOSIS — Z3402 Encounter for supervision of normal first pregnancy, second trimester: Secondary | ICD-10-CM | POA: Diagnosis not present

## 2021-10-12 ENCOUNTER — Other Ambulatory Visit: Payer: Self-pay | Admitting: *Deleted

## 2021-10-12 ENCOUNTER — Ambulatory Visit: Payer: Medicaid Other | Admitting: *Deleted

## 2021-10-12 ENCOUNTER — Other Ambulatory Visit: Payer: Self-pay

## 2021-10-12 ENCOUNTER — Ambulatory Visit: Payer: Medicaid Other | Attending: Maternal & Fetal Medicine

## 2021-10-12 ENCOUNTER — Encounter: Payer: Self-pay | Admitting: *Deleted

## 2021-10-12 VITALS — BP 140/89 | HR 94

## 2021-10-12 DIAGNOSIS — O09291 Supervision of pregnancy with other poor reproductive or obstetric history, first trimester: Secondary | ICD-10-CM | POA: Diagnosis not present

## 2021-10-12 DIAGNOSIS — Z8679 Personal history of other diseases of the circulatory system: Secondary | ICD-10-CM | POA: Diagnosis not present

## 2021-10-12 DIAGNOSIS — M329 Systemic lupus erythematosus, unspecified: Secondary | ICD-10-CM | POA: Diagnosis not present

## 2021-10-12 DIAGNOSIS — O10912 Unspecified pre-existing hypertension complicating pregnancy, second trimester: Secondary | ICD-10-CM

## 2021-10-12 DIAGNOSIS — O09292 Supervision of pregnancy with other poor reproductive or obstetric history, second trimester: Secondary | ICD-10-CM | POA: Diagnosis not present

## 2021-10-12 DIAGNOSIS — Z86711 Personal history of pulmonary embolism: Secondary | ICD-10-CM | POA: Diagnosis not present

## 2021-10-12 DIAGNOSIS — O09211 Supervision of pregnancy with history of pre-term labor, first trimester: Secondary | ICD-10-CM | POA: Diagnosis not present

## 2021-10-12 DIAGNOSIS — Z362 Encounter for other antenatal screening follow-up: Secondary | ICD-10-CM

## 2021-10-12 DIAGNOSIS — O09212 Supervision of pregnancy with history of pre-term labor, second trimester: Secondary | ICD-10-CM

## 2021-10-12 DIAGNOSIS — E669 Obesity, unspecified: Secondary | ICD-10-CM

## 2021-10-12 DIAGNOSIS — Z3A17 17 weeks gestation of pregnancy: Secondary | ICD-10-CM

## 2021-10-12 DIAGNOSIS — Z6841 Body Mass Index (BMI) 40.0 and over, adult: Secondary | ICD-10-CM

## 2021-10-12 DIAGNOSIS — J45909 Unspecified asthma, uncomplicated: Secondary | ICD-10-CM

## 2021-10-12 DIAGNOSIS — D6862 Lupus anticoagulant syndrome: Secondary | ICD-10-CM | POA: Diagnosis not present

## 2021-10-12 DIAGNOSIS — D6861 Antiphospholipid syndrome: Secondary | ICD-10-CM | POA: Diagnosis not present

## 2021-10-12 DIAGNOSIS — O99891 Other specified diseases and conditions complicating pregnancy: Secondary | ICD-10-CM

## 2021-10-12 DIAGNOSIS — O99212 Obesity complicating pregnancy, second trimester: Secondary | ICD-10-CM

## 2021-10-12 DIAGNOSIS — O99211 Obesity complicating pregnancy, first trimester: Secondary | ICD-10-CM | POA: Insufficient documentation

## 2021-10-12 DIAGNOSIS — O99111 Other diseases of the blood and blood-forming organs and certain disorders involving the immune mechanism complicating pregnancy, first trimester: Secondary | ICD-10-CM | POA: Diagnosis not present

## 2021-10-12 DIAGNOSIS — O09899 Supervision of other high risk pregnancies, unspecified trimester: Secondary | ICD-10-CM

## 2021-10-24 DIAGNOSIS — M329 Systemic lupus erythematosus, unspecified: Secondary | ICD-10-CM | POA: Diagnosis not present

## 2021-11-01 DIAGNOSIS — Z8751 Personal history of pre-term labor: Secondary | ICD-10-CM | POA: Diagnosis not present

## 2021-11-01 DIAGNOSIS — M3500 Sicca syndrome, unspecified: Secondary | ICD-10-CM | POA: Diagnosis not present

## 2021-11-01 DIAGNOSIS — Z113 Encounter for screening for infections with a predominantly sexual mode of transmission: Secondary | ICD-10-CM | POA: Diagnosis not present

## 2021-11-01 DIAGNOSIS — Z86711 Personal history of pulmonary embolism: Secondary | ICD-10-CM | POA: Diagnosis not present

## 2021-11-01 DIAGNOSIS — Z889 Allergy status to unspecified drugs, medicaments and biological substances status: Secondary | ICD-10-CM | POA: Diagnosis not present

## 2021-11-01 DIAGNOSIS — L930 Discoid lupus erythematosus: Secondary | ICD-10-CM | POA: Diagnosis not present

## 2021-11-01 DIAGNOSIS — R350 Frequency of micturition: Secondary | ICD-10-CM | POA: Diagnosis not present

## 2021-11-01 DIAGNOSIS — Z331 Pregnant state, incidental: Secondary | ICD-10-CM | POA: Diagnosis not present

## 2021-11-01 DIAGNOSIS — Z8759 Personal history of other complications of pregnancy, childbirth and the puerperium: Secondary | ICD-10-CM | POA: Diagnosis not present

## 2021-11-01 DIAGNOSIS — E559 Vitamin D deficiency, unspecified: Secondary | ICD-10-CM | POA: Diagnosis not present

## 2021-11-01 DIAGNOSIS — B009 Herpesviral infection, unspecified: Secondary | ICD-10-CM | POA: Diagnosis not present

## 2021-11-01 DIAGNOSIS — N898 Other specified noninflammatory disorders of vagina: Secondary | ICD-10-CM | POA: Diagnosis not present

## 2021-11-01 DIAGNOSIS — O99213 Obesity complicating pregnancy, third trimester: Secondary | ICD-10-CM | POA: Diagnosis not present

## 2021-11-09 ENCOUNTER — Ambulatory Visit: Payer: Medicaid Other | Attending: Obstetrics and Gynecology

## 2021-11-09 ENCOUNTER — Ambulatory Visit: Payer: Medicaid Other | Admitting: *Deleted

## 2021-11-09 ENCOUNTER — Other Ambulatory Visit: Payer: Self-pay | Admitting: *Deleted

## 2021-11-09 VITALS — BP 127/83 | Temp 95.0°F

## 2021-11-09 DIAGNOSIS — O09892 Supervision of other high risk pregnancies, second trimester: Secondary | ICD-10-CM

## 2021-11-09 DIAGNOSIS — O99512 Diseases of the respiratory system complicating pregnancy, second trimester: Secondary | ICD-10-CM | POA: Diagnosis not present

## 2021-11-09 DIAGNOSIS — Z86711 Personal history of pulmonary embolism: Secondary | ICD-10-CM | POA: Diagnosis not present

## 2021-11-09 DIAGNOSIS — M329 Systemic lupus erythematosus, unspecified: Secondary | ICD-10-CM | POA: Diagnosis not present

## 2021-11-09 DIAGNOSIS — O09899 Supervision of other high risk pregnancies, unspecified trimester: Secondary | ICD-10-CM

## 2021-11-09 DIAGNOSIS — O321XX0 Maternal care for breech presentation, not applicable or unspecified: Secondary | ICD-10-CM

## 2021-11-09 DIAGNOSIS — O99212 Obesity complicating pregnancy, second trimester: Secondary | ICD-10-CM

## 2021-11-09 DIAGNOSIS — O99891 Other specified diseases and conditions complicating pregnancy: Secondary | ICD-10-CM | POA: Insufficient documentation

## 2021-11-09 DIAGNOSIS — O10912 Unspecified pre-existing hypertension complicating pregnancy, second trimester: Secondary | ICD-10-CM

## 2021-11-09 DIAGNOSIS — Z362 Encounter for other antenatal screening follow-up: Secondary | ICD-10-CM

## 2021-11-09 DIAGNOSIS — O26892 Other specified pregnancy related conditions, second trimester: Secondary | ICD-10-CM | POA: Diagnosis not present

## 2021-11-09 DIAGNOSIS — O99519 Diseases of the respiratory system complicating pregnancy, unspecified trimester: Secondary | ICD-10-CM | POA: Insufficient documentation

## 2021-11-09 DIAGNOSIS — O09292 Supervision of pregnancy with other poor reproductive or obstetric history, second trimester: Secondary | ICD-10-CM | POA: Diagnosis not present

## 2021-11-09 DIAGNOSIS — J45909 Unspecified asthma, uncomplicated: Secondary | ICD-10-CM | POA: Diagnosis not present

## 2021-11-09 DIAGNOSIS — Z3A21 21 weeks gestation of pregnancy: Secondary | ICD-10-CM

## 2021-11-09 DIAGNOSIS — O09212 Supervision of pregnancy with history of pre-term labor, second trimester: Secondary | ICD-10-CM

## 2021-11-09 DIAGNOSIS — Z6841 Body Mass Index (BMI) 40.0 and over, adult: Secondary | ICD-10-CM

## 2021-11-10 DIAGNOSIS — O99112 Other diseases of the blood and blood-forming organs and certain disorders involving the immune mechanism complicating pregnancy, second trimester: Secondary | ICD-10-CM | POA: Diagnosis not present

## 2021-11-10 DIAGNOSIS — D6862 Lupus anticoagulant syndrome: Secondary | ICD-10-CM | POA: Diagnosis not present

## 2021-11-13 ENCOUNTER — Inpatient Hospital Stay (HOSPITAL_COMMUNITY)
Admission: AD | Admit: 2021-11-13 | Discharge: 2021-11-13 | Disposition: A | Discharge status: Home / Self Care | Payer: Medicaid Other | Attending: Obstetrics & Gynecology | Admitting: Obstetrics & Gynecology

## 2021-11-13 ENCOUNTER — Encounter (HOSPITAL_COMMUNITY): Payer: Self-pay | Admitting: Obstetrics & Gynecology

## 2021-11-13 ENCOUNTER — Other Ambulatory Visit: Payer: Self-pay

## 2021-11-13 DIAGNOSIS — Z3A21 21 weeks gestation of pregnancy: Secondary | ICD-10-CM | POA: Insufficient documentation

## 2021-11-13 DIAGNOSIS — O09213 Supervision of pregnancy with history of pre-term labor, third trimester: Secondary | ICD-10-CM | POA: Diagnosis not present

## 2021-11-13 DIAGNOSIS — Z79899 Other long term (current) drug therapy: Secondary | ICD-10-CM | POA: Insufficient documentation

## 2021-11-13 DIAGNOSIS — G44209 Tension-type headache, unspecified, not intractable: Secondary | ICD-10-CM | POA: Insufficient documentation

## 2021-11-13 DIAGNOSIS — O10919 Unspecified pre-existing hypertension complicating pregnancy, unspecified trimester: Secondary | ICD-10-CM | POA: Diagnosis not present

## 2021-11-13 DIAGNOSIS — O26892 Other specified pregnancy related conditions, second trimester: Secondary | ICD-10-CM | POA: Insufficient documentation

## 2021-11-13 DIAGNOSIS — Z7901 Long term (current) use of anticoagulants: Secondary | ICD-10-CM | POA: Insufficient documentation

## 2021-11-13 DIAGNOSIS — O10912 Unspecified pre-existing hypertension complicating pregnancy, second trimester: Secondary | ICD-10-CM | POA: Diagnosis not present

## 2021-11-13 DIAGNOSIS — Z86711 Personal history of pulmonary embolism: Secondary | ICD-10-CM | POA: Insufficient documentation

## 2021-11-13 DIAGNOSIS — O99352 Diseases of the nervous system complicating pregnancy, second trimester: Secondary | ICD-10-CM | POA: Diagnosis not present

## 2021-11-13 DIAGNOSIS — M3500 Sicca syndrome, unspecified: Secondary | ICD-10-CM | POA: Insufficient documentation

## 2021-11-13 DIAGNOSIS — E860 Dehydration: Secondary | ICD-10-CM

## 2021-11-13 LAB — COMPREHENSIVE METABOLIC PANEL
ALT: 11 U/L (ref 0–44)
AST: 14 U/L — ABNORMAL LOW (ref 15–41)
Albumin: 2.7 g/dL — ABNORMAL LOW (ref 3.5–5.0)
Alkaline Phosphatase: 57 U/L (ref 38–126)
Anion gap: 9 (ref 5–15)
BUN: 6 mg/dL (ref 6–20)
CO2: 22 mmol/L (ref 22–32)
Calcium: 8.9 mg/dL (ref 8.9–10.3)
Chloride: 105 mmol/L (ref 98–111)
Creatinine, Ser: 0.6 mg/dL (ref 0.44–1.00)
GFR, Estimated: >60 mL/min (ref >60)
Glucose, Bld: 139 mg/dL — ABNORMAL HIGH (ref 70–99)
Potassium: 3.2 mmol/L — ABNORMAL LOW (ref 3.5–5.1)
Sodium: 136 mmol/L (ref 135–145)
Total Bilirubin: 0.5 mg/dL (ref 0.3–1.2)
Total Protein: 6 g/dL — ABNORMAL LOW (ref 6.5–8.1)

## 2021-11-13 LAB — CBC
HCT: 32.6 % — ABNORMAL LOW (ref 36.0–46.0)
Hemoglobin: 10.7 g/dL — ABNORMAL LOW (ref 12.0–15.0)
MCH: 28.8 pg (ref 26.0–34.0)
MCHC: 32.8 g/dL (ref 30.0–36.0)
MCV: 87.9 fL (ref 80.0–100.0)
Platelets: 283 10*3/uL (ref 150–400)
RBC: 3.71 MIL/uL — ABNORMAL LOW (ref 3.87–5.11)
RDW: 13.5 % (ref 11.5–15.5)
WBC: 7.3 10*3/uL (ref 4.0–10.5)
nRBC: 0 % (ref 0.0–0.2)

## 2021-11-13 LAB — PROTEIN / CREATININE RATIO, URINE
Creatinine, Urine: 212.93 mg/dL
Protein Creatinine Ratio: 0.06 mg/mg{Cre} (ref 0.00–0.15)
Total Protein, Urine: 12 mg/dL

## 2021-11-13 LAB — URINALYSIS, ROUTINE W REFLEX MICROSCOPIC
Bilirubin Urine: NEGATIVE
Glucose, UA: NEGATIVE mg/dL
Hgb urine dipstick: NEGATIVE
Ketones, ur: NEGATIVE mg/dL
Leukocytes,Ua: NEGATIVE
Nitrite: NEGATIVE
Protein, ur: NEGATIVE mg/dL
Specific Gravity, Urine: 1.024 (ref 1.005–1.030)
pH: 6 (ref 5.0–8.0)

## 2021-11-13 MED ORDER — METOCLOPRAMIDE HCL 5 MG/ML IJ SOLN
10.0000 mg | Freq: Once | INTRAMUSCULAR | Status: AC
  Administered 2021-11-13: 10 mg via INTRAVENOUS
  Filled 2021-11-13: qty 2

## 2021-11-13 MED ORDER — DEXAMETHASONE SODIUM PHOSPHATE 10 MG/ML IJ SOLN
10.0000 mg | Freq: Once | INTRAMUSCULAR | Status: AC
  Administered 2021-11-13: 10 mg via INTRAVENOUS
  Filled 2021-11-13: qty 1

## 2021-11-13 MED ORDER — DIPHENHYDRAMINE HCL 50 MG/ML IJ SOLN
25.0000 mg | Freq: Once | INTRAMUSCULAR | Status: AC
  Administered 2021-11-13: 25 mg via INTRAVENOUS
  Filled 2021-11-13: qty 1

## 2021-11-13 MED ORDER — LACTATED RINGERS IV BOLUS
1000.0000 mL | Freq: Once | INTRAVENOUS | Status: AC
Start: 2021-11-13 — End: 2021-11-13
  Administered 2021-11-13: 1000 mL via INTRAVENOUS

## 2021-11-13 NOTE — MAU Provider Note (Signed)
?History  ?  ? ?CSN: [id number] ? ?Arrival date and time: 11/13/21 1402 ? ? Event Date/Time  ? First Provider Initiated Contact with Patient 11/13/21 1518   ?  ? ?Chief Complaint  ?Patient presents with  ? Headache  ? BP evaluation  ? ?36 year old G4 P1-1-1-2 at 21.5 weeks with history of CHTN presenting with elevated BP and headache. Reports left temporal headache since 10 AM.  Rates pain 9/10.  She took 2 extra strength Tylenol but did not get relief.  Reports home BP 141/95 and 143/98.  She took her dose of Labetalol around 11 AM but usually takes it around 8 AM.  Endorses blurry vision.  Reports feeling jittery.  She is eating and drinking well.  Had one half large bottle of water today. Denies RUQ pain, SOB, and CP.   ? ?OB History   ? ? Gravida  ?4  ? Para  ?2  ? Term  ?1  ? Preterm  ?1  ? AB  ?1  ? Living  ?2  ?  ? ? SAB  ?1  ? IAB  ?   ? Ectopic  ?   ? Multiple  ?0  ? Live Births  ?2  ?   ?  ?  ? ? ?Past Medical History:  ?Diagnosis Date  ? Anxiety   ? doing good now  ? Asthma   ? Depression   ? doing good  ? Headache(784.0)   ? HSV infection   ? Hypertension   ? was taken off meds after last preg  ? Infection   ? UTI  ? Lupus (HCC)   ? dx age 4  ? Pulmonary embolism (HCC) 06/4/82011  ? STD (sexually transmitted disease) 02/2009  ? POSITIVE GC  ? UTI (urinary tract infection)   ? ? ?Past Surgical History:  ?Procedure Laterality Date  ? ADENOIDECTOMY    ? TONSILLECTOMY AND ADENOIDECTOMY  1998  ? WISDOM TOOTH EXTRACTION    ? ? ?Family History  ?Problem Relation Age of Onset  ? Asthma Mother   ? Healthy Father   ? Asthma Brother   ? Diabetes Maternal Aunt   ? Lupus Paternal Aunt   ? Stroke Paternal Aunt   ? Cancer Maternal Grandmother 63  ?     COLON CA  ? Diabetes Maternal Grandmother   ? Hypertension Maternal Grandfather   ? Cancer Maternal Grandfather   ?     prostate  ? Cancer Paternal Grandmother   ?     breast  ? Lupus Paternal Grandmother   ? Anesthesia problems Neg Hx   ? Hypotension Neg Hx   ?  Malignant hyperthermia Neg Hx   ? Pseudochol deficiency Neg Hx   ? ? ?Social History  ? ?Tobacco Use  ? Smoking status: Never  ? Smokeless tobacco: Never  ?Vaping Use  ? Vaping Use: Never used  ?Substance Use Topics  ? Alcohol use: No  ? Drug use: No  ? ? ?Allergies:  ?Allergies  ?Allergen Reactions  ? Bactrim Anaphylaxis and Hives  ? Hydrocodone-Acetaminophen Anaphylaxis  ? Nifedipine Swelling and Anaphylaxis  ?  Swelling of tongue  ? Pineapple Itching  ? Prednisone Anaphylaxis and Hives  ?  Has been on Dexamethasone without issue.  ? Shellfish Allergy Itching  ? Sulfamethoxazole-Trimethoprim Other (See Comments)  ?  Complications due to lupus  ? Nsaids Other (See Comments)  ?  Has Lupus, reccommended to avoid NSAIDs  ? Other Itching  ?  Shrimp:throat itching ?"can eat other shellfish"  ? Vicodin [Hydrocodone-Acetaminophen] Hives and Swelling  ?  Can take Percocet and Tylenol without reaction.  ? ? ?Medications Prior to Admission  ?Medication Sig Dispense Refill Last Dose  ? albuterol (PROVENTIL) (2.5 MG/3ML) 0.083% nebulizer solution Take 3 mLs (2.5 mg total) by nebulization every 6 (six) hours as needed for wheezing or shortness of breath. 75 mL 0 Past Month  ? albuterol (VENTOLIN HFA) 108 (90 Base) MCG/ACT inhaler Inhale 2 puffs into the lungs every 6 (six) hours as needed for wheezing or shortness of breath. 18 g 0 Past Month  ? cholecalciferol (VITAMIN D3) 25 MCG (1000 UNIT) tablet Take 2,000 Units by mouth daily.   11/12/2021  ? enoxaparin (LOVENOX) 150 MG/ML injection Inject 150 mg into the skin every 12 (twelve) hours.   11/13/2021  ? hydroxychloroquine (PLAQUENIL) 200 MG tablet Take by mouth daily.   11/13/2021  ? labetalol (NORMODYNE) 100 MG tablet Take 2 tablets (200 mg total) by mouth 2 (two) times daily. 120 tablet 5 11/13/2021  ? methylPREDNISolone (MEDROL) 4 MG tablet Take 4 mg by mouth daily.   11/13/2021  ? Multiple Vitamin (MULTIVITAMIN) capsule Take 1 capsule by mouth daily.   11/12/2021  ?  oxyCODONE-acetaminophen (PERCOCET/ROXICET) 5-325 MG tablet Take by mouth every 4 (four) hours as needed for severe pain.   11/12/2021  ? doxylamine, Sleep, (UNISOM) 25 MG tablet Take 1 tablet (25 mg total) by mouth every 8 (eight) hours as needed. 30 tablet 0   ? EPINEPHrine 0.1 MG/0.1ML SOAJ Inject 1 Dose as directed as needed (for anaphylaxis). 2 each 2   ? ? ?Review of Systems  ?Eyes:  Positive for visual disturbance.  ?Respiratory:  Negative for shortness of breath.   ?Cardiovascular:  Negative for chest pain.  ?Gastrointestinal:  Negative for abdominal pain.  ?Neurological:  Positive for headaches.  ?Physical Exam  ? ?Blood pressure (!) 111/48, pulse 82, temperature 97.8 ?F (36.6 ?C), temperature source Oral, resp. rate 20, height 5\' 8"  (1.727 m), weight (!) 151.5 kg, last menstrual period 06/04/2021, SpO2 96 %. ?Patient Vitals for the past 24 hrs: ? BP Temp Temp src Pulse Resp SpO2 Height Weight  ?11/13/21 1731 (!) 111/48 -- -- 82 -- -- -- --  ?11/13/21 1717 (!) 113/51 -- -- 82 -- -- -- --  ?11/13/21 1701 127/71 -- -- 88 -- -- -- --  ?11/13/21 1646 125/72 -- -- 91 -- -- -- --  ?11/13/21 1632 116/75 -- -- 87 -- -- -- --  ?11/13/21 1617 (!) 112/49 -- -- 80 -- -- -- --  ?11/13/21 1604 121/77 -- -- 92 -- -- -- --  ?11/13/21 1443 139/81 97.8 ?F (36.6 ?C) Oral (!) 102 20 96 % -- --  ?11/13/21 1436 -- -- -- -- -- -- 5\' 8"  (1.727 m) (!) 151.5 kg  ? ? ?Physical Exam ?Vitals and nursing note reviewed.  ?Constitutional:   ?   General: She is not in acute distress. ?   Appearance: Normal appearance.  ?HENT:  ?   Head: Normocephalic and atraumatic.  ?Cardiovascular:  ?   Rate and Rhythm: Normal rate.  ?Pulmonary:  ?   Effort: Pulmonary effort is normal. No respiratory distress.  ?Musculoskeletal:     ?   General: Normal range of motion.  ?   Cervical back: Normal range of motion.  ?Skin: ?   General: Skin is warm and dry.  ?Neurological:  ?   General: No focal deficit present.  ?  Mental Status: She is alert and oriented  to person, place, and time.  ?   Cranial Nerves: No cranial nerve deficit.  ?   Motor: No weakness.  ?   Deep Tendon Reflexes: Reflexes normal.  ?Psychiatric:     ?   Mood and Affect: Mood normal.     ?   Behavior: Behavior normal.  ?FHT: 155 bpm ? ?Results for orders placed or performed during the hospital encounter of 11/13/21 (from the past 24 hour(s))  ?Urinalysis, Routine w reflex microscopic Urine, Clean Catch     Status: Abnormal  ? Collection Time: 11/13/21  2:47 PM  ?Result Value Ref Range  ? Color, Urine YELLOW YELLOW  ? APPearance HAZY (A) CLEAR  ? Specific Gravity, Urine 1.024 1.005 - 1.030  ? pH 6.0 5.0 - 8.0  ? Glucose, UA NEGATIVE NEGATIVE mg/dL  ? Hgb urine dipstick NEGATIVE NEGATIVE  ? Bilirubin Urine NEGATIVE NEGATIVE  ? Ketones, ur NEGATIVE NEGATIVE mg/dL  ? Protein, ur NEGATIVE NEGATIVE mg/dL  ? Nitrite NEGATIVE NEGATIVE  ? Leukocytes,Ua NEGATIVE NEGATIVE  ?Protein / creatinine ratio, urine     Status: None  ? Collection Time: 11/13/21  2:47 PM  ?Result Value Ref Range  ? Creatinine, Urine 212.93 mg/dL  ? Total Protein, Urine 12 mg/dL  ? Protein Creatinine Ratio 0.06 0.00 - 0.15 mg/mg[Cre]  ?CBC     Status: Abnormal  ? Collection Time: 11/13/21  3:16 PM  ?Result Value Ref Range  ? WBC 7.3 4.0 - 10.5 K/uL  ? RBC 3.71 (L) 3.87 - 5.11 MIL/uL  ? Hemoglobin 10.7 (L) 12.0 - 15.0 g/dL  ? HCT 32.6 (L) 36.0 - 46.0 %  ? MCV 87.9 80.0 - 100.0 fL  ? MCH 28.8 26.0 - 34.0 pg  ? MCHC 32.8 30.0 - 36.0 g/dL  ? RDW 13.5 11.5 - 15.5 %  ? Platelets 283 150 - 400 K/uL  ? nRBC 0.0 0.0 - 0.2 %  ?Comprehensive metabolic panel     Status: Abnormal  ? Collection Time: 11/13/21  3:16 PM  ?Result Value Ref Range  ? Sodium 136 135 - 145 mmol/L  ? Potassium 3.2 (L) 3.5 - 5.1 mmol/L  ? Chloride 105 98 - 111 mmol/L  ? CO2 22 22 - 32 mmol/L  ? Glucose, Bld 139 (H) 70 - 99 mg/dL  ? BUN 6 6 - 20 mg/dL  ? Creatinine, Ser 0.60 0.44 - 1.00 mg/dL  ? Calcium 8.9 8.9 - 10.3 mg/dL  ? Total Protein 6.0 (L) 6.5 - 8.1 g/dL  ? Albumin 2.7  (L) 3.5 - 5.0 g/dL  ? AST 14 (L) 15 - 41 U/L  ? ALT 11 0 - 44 U/L  ? Alkaline Phosphatase 57 38 - 126 U/L  ? Total Bilirubin 0.5 0.3 - 1.2 mg/dL  ? GFR, Estimated >60 >60 mL/min  ? Anion gap 9 5 - 15  ? ?MAU Course  ?Proce

## 2021-11-13 NOTE — MAU Note (Signed)
Hayley Webb is a 36 y.o. at [redacted]w[redacted]d here in MAU reporting: woke up with H/A, checked BP and BP 141/95 & 143/98.  Hx of CHTN on Labetalol 200 mg twice a day, last taken 1100.  Reports current H/A, epigastric pain and blurred vision.  Took Tylenol for H/A @ 1100, no relief noted. ? ?Onset of complaint: today ?Pain score: 9/10 headache & 8/10 epigastric ?Vitals:  ? 11/13/21 1443  ?BP: 139/81  ?Pulse: (!) 102  ?Resp: 20  ?Temp: 97.8 ?F (36.6 ?C)  ?SpO2: 96%  ?   ?FHT:+FM ?Lab orders placed from triage: UA   ?

## 2021-11-21 DIAGNOSIS — F4323 Adjustment disorder with mixed anxiety and depressed mood: Secondary | ICD-10-CM | POA: Diagnosis not present

## 2021-11-22 DIAGNOSIS — R030 Elevated blood-pressure reading, without diagnosis of hypertension: Secondary | ICD-10-CM | POA: Diagnosis not present

## 2021-11-24 ENCOUNTER — Inpatient Hospital Stay (HOSPITAL_COMMUNITY)
Admission: AD | Admit: 2021-11-24 | Discharge: 2021-11-25 | Disposition: A | Discharge status: Home / Self Care | Payer: Medicaid Other | Attending: Obstetrics & Gynecology | Admitting: Obstetrics & Gynecology

## 2021-11-24 ENCOUNTER — Other Ambulatory Visit: Payer: Self-pay

## 2021-11-24 DIAGNOSIS — R519 Headache, unspecified: Secondary | ICD-10-CM | POA: Diagnosis not present

## 2021-11-24 DIAGNOSIS — O26892 Other specified pregnancy related conditions, second trimester: Secondary | ICD-10-CM | POA: Insufficient documentation

## 2021-11-24 DIAGNOSIS — O10912 Unspecified pre-existing hypertension complicating pregnancy, second trimester: Secondary | ICD-10-CM | POA: Insufficient documentation

## 2021-11-24 DIAGNOSIS — O10919 Unspecified pre-existing hypertension complicating pregnancy, unspecified trimester: Secondary | ICD-10-CM

## 2021-11-24 DIAGNOSIS — Z7901 Long term (current) use of anticoagulants: Secondary | ICD-10-CM | POA: Insufficient documentation

## 2021-11-24 DIAGNOSIS — Z3A23 23 weeks gestation of pregnancy: Secondary | ICD-10-CM | POA: Diagnosis not present

## 2021-11-25 ENCOUNTER — Encounter (HOSPITAL_COMMUNITY): Payer: Self-pay | Admitting: Obstetrics & Gynecology

## 2021-11-25 DIAGNOSIS — O26892 Other specified pregnancy related conditions, second trimester: Secondary | ICD-10-CM

## 2021-11-25 DIAGNOSIS — O10919 Unspecified pre-existing hypertension complicating pregnancy, unspecified trimester: Secondary | ICD-10-CM | POA: Diagnosis not present

## 2021-11-25 DIAGNOSIS — R519 Headache, unspecified: Secondary | ICD-10-CM | POA: Diagnosis not present

## 2021-11-25 DIAGNOSIS — Z3A23 23 weeks gestation of pregnancy: Secondary | ICD-10-CM

## 2021-11-25 LAB — CBC
HCT: 33.5 % — ABNORMAL LOW (ref 36.0–46.0)
Hemoglobin: 11.4 g/dL — ABNORMAL LOW (ref 12.0–15.0)
MCH: 29.5 pg (ref 26.0–34.0)
MCHC: 34 g/dL (ref 30.0–36.0)
MCV: 86.6 fL (ref 80.0–100.0)
Platelets: 312 10*3/uL (ref 150–400)
RBC: 3.87 MIL/uL (ref 3.87–5.11)
RDW: 13.6 % (ref 11.5–15.5)
WBC: 9.1 10*3/uL (ref 4.0–10.5)
nRBC: 0 % (ref 0.0–0.2)

## 2021-11-25 LAB — PROTEIN / CREATININE RATIO, URINE
Creatinine, Urine: 298.9 mg/dL
Protein Creatinine Ratio: 0.05 mg/mg{Cre} (ref 0.00–0.15)
Total Protein, Urine: 14 mg/dL

## 2021-11-25 LAB — COMPREHENSIVE METABOLIC PANEL
ALT: 14 U/L (ref 0–44)
AST: 15 U/L (ref 15–41)
Albumin: 2.9 g/dL — ABNORMAL LOW (ref 3.5–5.0)
Alkaline Phosphatase: 61 U/L (ref 38–126)
Anion gap: 9 (ref 5–15)
BUN: 10 mg/dL (ref 6–20)
CO2: 18 mmol/L — ABNORMAL LOW (ref 22–32)
Calcium: 9.3 mg/dL (ref 8.9–10.3)
Chloride: 108 mmol/L (ref 98–111)
Creatinine, Ser: 0.64 mg/dL (ref 0.44–1.00)
GFR, Estimated: >60 mL/min (ref >60)
Glucose, Bld: 122 mg/dL — ABNORMAL HIGH (ref 70–99)
Potassium: 3.4 mmol/L — ABNORMAL LOW (ref 3.5–5.1)
Sodium: 135 mmol/L (ref 135–145)
Total Bilirubin: 0.2 mg/dL — ABNORMAL LOW (ref 0.3–1.2)
Total Protein: 6.3 g/dL — ABNORMAL LOW (ref 6.5–8.1)

## 2021-11-25 MED ORDER — ACETAMINOPHEN-CAFFEINE 500-65 MG PO TABS
2.0000 | ORAL_TABLET | Freq: Once | ORAL | Status: AC
  Administered 2021-11-25: 2 via ORAL
  Filled 2021-11-25: qty 2

## 2021-11-25 NOTE — MAU Note (Addendum)
.  Jacklyne Baik is a 36 y.o. at [redacted]w[redacted]d here in MAU reporting high b/p of 139/100. Has had a h/a since 12noon. Took Fioricet which helped but then h/a returned. Takes Labetalol 300mg  BID and had second dose at 2030. Denies epigastric pain and no visual changes. Reports good FM ?[facility] 03/15/22 ?Onset of complaint: 12n ?Pain score: 7 ?Vitals:  ? 11/25/21 0006 11/25/21 0008  ?BP:  (!) 157/101  ?Pulse: (!) 109   ?Resp: 18   ?Temp: 97.8 ?F (36.6 ?C)   ?SpO2: 98%   ?   ?FHT:154 ?Lab orders placed from triage:  none ? ?

## 2021-11-25 NOTE — MAU Provider Note (Signed)
Chief Complaint:  Hypertension ? ? Event Date/Time  ? First Provider Initiated Contact with Patient 11/25/21 0119   ? HPI: Hayley Webb is a 36 y.o. [id number] at 68w3dwho presents to maternity admissions reporting persistent/recurrent headache and elevated blood pressure.  She has a known condition of chronic hypertension, being treated with Labetalol 300mg  bid.   She has had headaches for several days.  Her [name] Vicky prescribed Fioricet which did help her headache.  Did not take any today   Took Tylenol at noon and it did not help. Came in primarily to have her BP checked. Marland Kitchen ?She reports good fetal movement, denies LOF, vaginal bleeding, vaginal itching/burning, urinary symptoms, dizziness, n/v, diarrhea, constipation or fever/chills.  She denies visual changes or RUQ abdominal pain. ? ?Hypertension ?This is a recurrent problem. The problem has been waxing and waning since onset. Associated symptoms include headaches. Pertinent negatives include no blurred vision, chest pain, malaise/fatigue, neck pain or palpitations. There are no associated agents to hypertension. Treatments tried: Labetalol. There are no compliance problems.   ?Headache  ?This is a recurrent problem. The current episode started today. The problem occurs constantly. The problem has been unchanged. The quality of the pain is described as aching and dull. Associated symptoms include photophobia. Pertinent negatives include no abdominal pain, back pain, blurred vision, coughing, dizziness, fever, loss of balance, muscle aches, neck pain, sinus pressure or visual change. The symptoms are aggravated by bright light. She has tried acetaminophen for the symptoms. The treatment provided no relief. Her past medical history is significant for hypertension.  ? ? ?RN Note: ?[name] is a 36 y.o. at [redacted]w[redacted]d here in MAU reporting high b/p of 139/100. Has had a h/a since 12noon. Took Fioricet which helped but then h/a returned. Takes Labetalol 300mg  BID and  had second dose at 2030. Denies epigastric pain and no visual changes. Reports good FM ?[facility] 03/15/22 ?Onset of complaint: 12n ?Pain score: 7 ? ? ?Past Medical History: ?Past Medical History:  ?Diagnosis Date  ? Anxiety   ? doing good now  ? Asthma   ? Depression   ? doing good  ? Headache(784.0)   ? HSV infection   ? Hypertension   ? was taken off meds after last preg  ? Infection   ? UTI  ? Lupus ([place])   ? dx age 42  ? Pulmonary embolism ([place]) 06/4/82011  ? STD (sexually transmitted disease) 02/2009  ? POSITIVE GC  ? UTI (urinary tract infection)   ? ? ?Past obstetric history: ?OB History  ?Gravida Para Term Preterm AB Living  ?4 2 1 1 1 2   ?SAB IAB Ectopic Multiple Live Births  ?1     0 2  ?  ?# Outcome Date GA Lbr Len/2nd Weight Sex Delivery Anes PTL Lv  ?4 Current           ?3 Preterm 01/15/19 [redacted]w[redacted]d 597:36 / 00:10 2865 g M Vag-Spont EPI  LIV  ?2 Term 10/25/13 [redacted]w[redacted]d 09:43 / 00:14  M Vag-Vacuum EPI  LIV  ?1 SAB           ? ? ?Past Surgical History: ?Past Surgical History:  ?Procedure Laterality Date  ? ADENOIDECTOMY    ? TONSILLECTOMY AND ADENOIDECTOMY  1998  ? WISDOM TOOTH EXTRACTION    ? ? ?Family History: ?Family History  ?Problem Relation Age of Onset  ? Asthma Mother   ? Healthy Father   ? Asthma Brother   ? Diabetes Maternal Aunt   ?  Lupus Paternal Aunt   ? Stroke Paternal Aunt   ? Cancer Maternal Grandmother 30  ?     COLON CA  ? Diabetes Maternal Grandmother   ? Hypertension Maternal Grandfather   ? Cancer Maternal Grandfather   ?     prostate  ? Cancer Paternal Grandmother   ?     breast  ? Lupus Paternal Grandmother   ? Anesthesia problems Neg Hx   ? Hypotension Neg Hx   ? Malignant hyperthermia Neg Hx   ? Pseudochol deficiency Neg Hx   ? ? ?Social History: ?Social History  ? ?Tobacco Use  ? Smoking status: Never  ? Smokeless tobacco: Never  ?Vaping Use  ? Vaping Use: Never used  ?Substance Use Topics  ? Alcohol use: No  ? Drug use: No  ? ? ?Allergies:  ?Allergies  ?Allergen Reactions  ? Bactrim  Anaphylaxis and Hives  ? Hydrocodone-Acetaminophen Anaphylaxis  ? Nifedipine Swelling and Anaphylaxis  ?  Swelling of tongue  ? Pineapple Itching  ? Prednisone Anaphylaxis and Hives  ?  Has been on Dexamethasone without issue.  ? Shellfish Allergy Itching  ? Sulfamethoxazole-Trimethoprim Other (See Comments)  ?  Complications due to lupus  ? Nsaids Other (See Comments)  ?  Has Lupus, reccommended to avoid NSAIDs  ? Other Itching  ?  Shrimp:throat itching ?"can eat other shellfish"  ? Vicodin [Hydrocodone-Acetaminophen] Hives and Swelling  ?  Can take Percocet and Tylenol without reaction.  ? ? ?Meds:  ?Medications Prior to Admission  ?Medication Sig Dispense Refill Last Dose  ? albuterol (PROVENTIL) (2.5 MG/3ML) 0.083% nebulizer solution Take 3 mLs (2.5 mg total) by nebulization every 6 (six) hours as needed for wheezing or shortness of breath. 75 mL 0 Past Month  ? albuterol (VENTOLIN HFA) 108 (90 Base) MCG/ACT inhaler Inhale 2 puffs into the lungs every 6 (six) hours as needed for wheezing or shortness of breath. 18 g 0 Past Month  ? cholecalciferol (VITAMIN D3) 25 MCG (1000 UNIT) tablet Take 2,000 Units by mouth daily.   11/25/2021  ? enoxaparin (LOVENOX) 150 MG/ML injection Inject 150 mg into the skin every 12 (twelve) hours.   11/25/2021  ? labetalol (NORMODYNE) 100 MG tablet Take 2 tablets (200 mg total) by mouth 2 (two) times daily. 120 tablet 5 11/25/2021  ? methylPREDNISolone (MEDROL) 4 MG tablet Take 4 mg by mouth daily.   11/25/2021  ? Multiple Vitamin (MULTIVITAMIN) capsule Take 1 capsule by mouth daily.   11/25/2021  ? doxylamine, Sleep, (UNISOM) 25 MG tablet Take 1 tablet (25 mg total) by mouth every 8 (eight) hours as needed. 30 tablet 0   ? EPINEPHrine 0.1 MG/0.1ML SOAJ Inject 1 Dose as directed as needed (for anaphylaxis). 2 each 2   ? hydroxychloroquine (PLAQUENIL) 200 MG tablet Take by mouth daily.     ? oxyCODONE-acetaminophen (PERCOCET/ROXICET) 5-325 MG tablet Take by mouth every 4 (four) hours as needed  for severe pain.     ? ? ?I have reviewed patient's Past Medical Hx, Surgical Hx, Family Hx, Social Hx, medications and allergies.  ? ?ROS:  ?Review of Systems  ?Constitutional:  Negative for fever and malaise/fatigue.  ?HENT:  Negative for sinus pressure.   ?Eyes:  Positive for photophobia. Negative for blurred vision.  ?Respiratory:  Negative for cough.   ?Cardiovascular:  Negative for chest pain and palpitations.  ?Gastrointestinal:  Negative for abdominal pain.  ?Musculoskeletal:  Negative for back pain and neck pain.  ?Neurological:  Positive  for headaches. Negative for dizziness and loss of balance.  ?Other systems negative ? ?Physical Exam  ?Patient Vitals for the past 24 hrs: ? BP Temp Pulse Resp SpO2 Height Weight  ?11/25/21 0115 136/86 -- 100 -- 99 % -- --  ?11/25/21 0110 -- -- -- -- 98 % -- --  ?11/25/21 0105 -- -- -- -- 98 % -- --  ?11/25/21 0100 (!) 144/78 -- (!) 101 -- 97 % -- --  ?11/25/21 0055 -- -- -- -- 97 % -- --  ?11/25/21 0050 -- -- -- -- 97 % -- --  ?11/25/21 0046 (!) 145/82 -- (!) 105 -- -- -- --  ?11/25/21 0045 -- -- -- -- 97 % -- --  ?11/25/21 0040 -- -- -- -- 97 % -- --  ?11/25/21 0035 -- -- -- -- 98 % -- --  ?11/25/21 0030 (!) 143/86 -- (!) 114 -- -- -- --  ?11/25/21 0008 (!) 157/101 -- -- -- -- -- --  ?11/25/21 0006 -- 97.8 ?F (36.6 ?C) (!) 109 18 98 % 5' 8.5" (1.74 m) (!) 152 kg  ? ?127/71 seen by me during reassessment after treatment ? ?Constitutional: Well-developed, well-nourished female in no acute distress.  ?Cardiovascular: normal rate and rhythm ?Respiratory: normal effort ?GI: Abd soft, non-tender, gravid appropriate for gestational age.   No rebound or guarding. ?MS: Extremities nontender, Trace to 1+ ankle edema, normal ROM ?Neurologic: Alert and oriented x 4.  DTRs 2+ with no clonus ?GU: Neg CVAT. ?  ?FHT:  Baseline 135=140 , moderate variability, accelerations present, no decelerations ?Contractions: none ?  ?Labs: ?Results for orders placed or performed during the hospital  encounter of 11/24/21 (from the past 24 hour(s))  ?Protein / creatinine ratio, urine     Status: None  ? Collection Time: 11/24/21 11:56 PM  ?Result Value Ref Range  ? Creatinine, Urine 298.90 mg/dL  ? Total

## 2021-12-01 DIAGNOSIS — O99112 Other diseases of the blood and blood-forming organs and certain disorders involving the immune mechanism complicating pregnancy, second trimester: Secondary | ICD-10-CM | POA: Diagnosis not present

## 2021-12-01 DIAGNOSIS — D6862 Lupus anticoagulant syndrome: Secondary | ICD-10-CM | POA: Diagnosis not present

## 2021-12-01 DIAGNOSIS — M328 Other forms of systemic lupus erythematosus: Secondary | ICD-10-CM | POA: Diagnosis not present

## 2021-12-05 DIAGNOSIS — F4323 Adjustment disorder with mixed anxiety and depressed mood: Secondary | ICD-10-CM | POA: Diagnosis not present

## 2021-12-06 ENCOUNTER — Ambulatory Visit: Payer: Medicaid Other | Admitting: *Deleted

## 2021-12-06 ENCOUNTER — Encounter: Payer: Self-pay | Admitting: *Deleted

## 2021-12-06 ENCOUNTER — Other Ambulatory Visit: Payer: Self-pay | Admitting: *Deleted

## 2021-12-06 ENCOUNTER — Ambulatory Visit: Payer: Medicaid Other | Attending: Maternal & Fetal Medicine

## 2021-12-06 VITALS — BP 137/83 | HR 89

## 2021-12-06 DIAGNOSIS — O99112 Other diseases of the blood and blood-forming organs and certain disorders involving the immune mechanism complicating pregnancy, second trimester: Secondary | ICD-10-CM | POA: Diagnosis not present

## 2021-12-06 DIAGNOSIS — O09292 Supervision of pregnancy with other poor reproductive or obstetric history, second trimester: Secondary | ICD-10-CM

## 2021-12-06 DIAGNOSIS — O10012 Pre-existing essential hypertension complicating pregnancy, second trimester: Secondary | ICD-10-CM

## 2021-12-06 DIAGNOSIS — O99891 Other specified diseases and conditions complicating pregnancy: Secondary | ICD-10-CM | POA: Diagnosis not present

## 2021-12-06 DIAGNOSIS — O10912 Unspecified pre-existing hypertension complicating pregnancy, second trimester: Secondary | ICD-10-CM

## 2021-12-06 DIAGNOSIS — E669 Obesity, unspecified: Secondary | ICD-10-CM | POA: Diagnosis not present

## 2021-12-06 DIAGNOSIS — D6862 Lupus anticoagulant syndrome: Secondary | ICD-10-CM | POA: Diagnosis not present

## 2021-12-06 DIAGNOSIS — Z3A25 25 weeks gestation of pregnancy: Secondary | ICD-10-CM

## 2021-12-06 DIAGNOSIS — O09892 Supervision of other high risk pregnancies, second trimester: Secondary | ICD-10-CM | POA: Diagnosis not present

## 2021-12-06 DIAGNOSIS — J45909 Unspecified asthma, uncomplicated: Secondary | ICD-10-CM

## 2021-12-06 DIAGNOSIS — O99212 Obesity complicating pregnancy, second trimester: Secondary | ICD-10-CM

## 2021-12-06 DIAGNOSIS — Z86711 Personal history of pulmonary embolism: Secondary | ICD-10-CM

## 2021-12-06 DIAGNOSIS — M329 Systemic lupus erythematosus, unspecified: Secondary | ICD-10-CM | POA: Diagnosis not present

## 2021-12-06 DIAGNOSIS — O99512 Diseases of the respiratory system complicating pregnancy, second trimester: Secondary | ICD-10-CM | POA: Diagnosis not present

## 2021-12-06 NOTE — Progress Notes (Signed)
Us/

## 2021-12-12 DIAGNOSIS — Z23 Encounter for immunization: Secondary | ICD-10-CM | POA: Diagnosis not present

## 2021-12-12 DIAGNOSIS — L930 Discoid lupus erythematosus: Secondary | ICD-10-CM | POA: Diagnosis not present

## 2021-12-12 DIAGNOSIS — Z3492 Encounter for supervision of normal pregnancy, unspecified, second trimester: Secondary | ICD-10-CM | POA: Diagnosis not present

## 2021-12-12 LAB — HIV ANTIBODY (ROUTINE TESTING W REFLEX): HIV Screen 4th Generation wRfx: NONREACTIVE

## 2021-12-12 LAB — OB RESULTS CONSOLE RPR: RPR: NONREACTIVE

## 2021-12-26 DIAGNOSIS — F4323 Adjustment disorder with mixed anxiety and depressed mood: Secondary | ICD-10-CM | POA: Diagnosis not present

## 2021-12-29 DIAGNOSIS — O99112 Other diseases of the blood and blood-forming organs and certain disorders involving the immune mechanism complicating pregnancy, second trimester: Secondary | ICD-10-CM | POA: Diagnosis not present

## 2021-12-29 DIAGNOSIS — D6862 Lupus anticoagulant syndrome: Secondary | ICD-10-CM | POA: Diagnosis not present

## 2022-01-04 ENCOUNTER — Ambulatory Visit: Payer: Medicaid Other | Admitting: *Deleted

## 2022-01-04 ENCOUNTER — Ambulatory Visit: Payer: Medicaid Other | Attending: Obstetrics and Gynecology

## 2022-01-04 ENCOUNTER — Other Ambulatory Visit: Payer: Self-pay | Admitting: *Deleted

## 2022-01-04 VITALS — BP 134/87 | HR 102

## 2022-01-04 DIAGNOSIS — O10013 Pre-existing essential hypertension complicating pregnancy, third trimester: Secondary | ICD-10-CM | POA: Diagnosis not present

## 2022-01-04 DIAGNOSIS — O10912 Unspecified pre-existing hypertension complicating pregnancy, second trimester: Secondary | ICD-10-CM | POA: Insufficient documentation

## 2022-01-04 DIAGNOSIS — O09293 Supervision of pregnancy with other poor reproductive or obstetric history, third trimester: Secondary | ICD-10-CM | POA: Diagnosis not present

## 2022-01-04 DIAGNOSIS — O99213 Obesity complicating pregnancy, third trimester: Secondary | ICD-10-CM

## 2022-01-04 DIAGNOSIS — O26893 Other specified pregnancy related conditions, third trimester: Secondary | ICD-10-CM

## 2022-01-04 DIAGNOSIS — O09299 Supervision of pregnancy with other poor reproductive or obstetric history, unspecified trimester: Secondary | ICD-10-CM

## 2022-01-04 DIAGNOSIS — O99212 Obesity complicating pregnancy, second trimester: Secondary | ICD-10-CM | POA: Diagnosis not present

## 2022-01-04 DIAGNOSIS — O09899 Supervision of other high risk pregnancies, unspecified trimester: Secondary | ICD-10-CM

## 2022-01-04 DIAGNOSIS — Z3A29 29 weeks gestation of pregnancy: Secondary | ICD-10-CM

## 2022-01-04 DIAGNOSIS — M329 Systemic lupus erythematosus, unspecified: Secondary | ICD-10-CM | POA: Diagnosis not present

## 2022-01-04 DIAGNOSIS — O09213 Supervision of pregnancy with history of pre-term labor, third trimester: Secondary | ICD-10-CM

## 2022-01-04 DIAGNOSIS — O99891 Other specified diseases and conditions complicating pregnancy: Secondary | ICD-10-CM

## 2022-01-04 DIAGNOSIS — Z86711 Personal history of pulmonary embolism: Secondary | ICD-10-CM | POA: Insufficient documentation

## 2022-01-04 DIAGNOSIS — E669 Obesity, unspecified: Secondary | ICD-10-CM | POA: Diagnosis not present

## 2022-01-18 ENCOUNTER — Encounter (HOSPITAL_COMMUNITY): Payer: Self-pay | Admitting: Obstetrics & Gynecology

## 2022-01-18 ENCOUNTER — Other Ambulatory Visit: Payer: Self-pay

## 2022-01-18 ENCOUNTER — Inpatient Hospital Stay (HOSPITAL_COMMUNITY)
Admission: AD | Admit: 2022-01-18 | Discharge: 2022-01-18 | Disposition: A | Discharge status: Home / Self Care | Payer: Medicaid Other | Attending: Obstetrics & Gynecology | Admitting: Obstetrics & Gynecology

## 2022-01-18 DIAGNOSIS — O26893 Other specified pregnancy related conditions, third trimester: Secondary | ICD-10-CM | POA: Insufficient documentation

## 2022-01-18 DIAGNOSIS — O98513 Other viral diseases complicating pregnancy, third trimester: Secondary | ICD-10-CM | POA: Insufficient documentation

## 2022-01-18 DIAGNOSIS — O4703 False labor before 37 completed weeks of gestation, third trimester: Secondary | ICD-10-CM | POA: Diagnosis present

## 2022-01-18 DIAGNOSIS — Z79899 Other long term (current) drug therapy: Secondary | ICD-10-CM | POA: Insufficient documentation

## 2022-01-18 DIAGNOSIS — O10913 Unspecified pre-existing hypertension complicating pregnancy, third trimester: Secondary | ICD-10-CM | POA: Diagnosis not present

## 2022-01-18 DIAGNOSIS — Z3A31 31 weeks gestation of pregnancy: Secondary | ICD-10-CM | POA: Diagnosis not present

## 2022-01-18 DIAGNOSIS — O119 Pre-existing hypertension with pre-eclampsia, unspecified trimester: Secondary | ICD-10-CM

## 2022-01-18 LAB — URINALYSIS, ROUTINE W REFLEX MICROSCOPIC
Bilirubin Urine: NEGATIVE
Glucose, UA: NEGATIVE mg/dL
Hgb urine dipstick: NEGATIVE
Ketones, ur: NEGATIVE mg/dL
Leukocytes,Ua: NEGATIVE
Nitrite: NEGATIVE
Protein, ur: 100 mg/dL — AB
Specific Gravity, Urine: 1.02 (ref 1.005–1.030)
pH: 6 (ref 5.0–8.0)

## 2022-01-18 LAB — COMPREHENSIVE METABOLIC PANEL
ALT: 12 U/L (ref 0–44)
AST: 16 U/L (ref 15–41)
Albumin: 3 g/dL — ABNORMAL LOW (ref 3.5–5.0)
Alkaline Phosphatase: 74 U/L (ref 38–126)
Anion gap: 11 (ref 5–15)
BUN: 6 mg/dL (ref 6–20)
CO2: 21 mmol/L — ABNORMAL LOW (ref 22–32)
Calcium: 9.6 mg/dL (ref 8.9–10.3)
Chloride: 106 mmol/L (ref 98–111)
Creatinine, Ser: 0.6 mg/dL (ref 0.44–1.00)
GFR, Estimated: >60 mL/min (ref >60)
Glucose, Bld: 107 mg/dL — ABNORMAL HIGH (ref 70–99)
Potassium: 3.6 mmol/L (ref 3.5–5.1)
Sodium: 138 mmol/L (ref 135–145)
Total Bilirubin: 0.8 mg/dL (ref 0.3–1.2)
Total Protein: 6.7 g/dL (ref 6.5–8.1)

## 2022-01-18 LAB — CBC
HCT: 33.4 % — ABNORMAL LOW (ref 36.0–46.0)
Hemoglobin: 11.1 g/dL — ABNORMAL LOW (ref 12.0–15.0)
MCH: 28.3 pg (ref 26.0–34.0)
MCHC: 33.2 g/dL (ref 30.0–36.0)
MCV: 85.2 fL (ref 80.0–100.0)
Platelets: 303 10*3/uL (ref 150–400)
RBC: 3.92 MIL/uL (ref 3.87–5.11)
RDW: 13.5 % (ref 11.5–15.5)
WBC: 5.8 10*3/uL (ref 4.0–10.5)
nRBC: 0 % (ref 0.0–0.2)

## 2022-01-18 LAB — PROTEIN / CREATININE RATIO, URINE
Creatinine, Urine: 248.09 mg/dL
Protein Creatinine Ratio: 0.43 mg/mg{Cre} — ABNORMAL HIGH (ref 0.00–0.15)
Total Protein, Urine: 107 mg/dL

## 2022-01-18 LAB — AMNISURE RUPTURE OF MEMBRANE (ROM) NOT AT ARMC: Amnisure ROM: NEGATIVE

## 2022-01-18 MED ORDER — ACETAMINOPHEN-CAFFEINE 500-65 MG PO TABS
2.0000 | ORAL_TABLET | Freq: Once | ORAL | Status: AC
  Administered 2022-01-18: 2 via ORAL
  Filled 2022-01-18: qty 2

## 2022-01-18 MED ORDER — LABETALOL HCL 100 MG PO TABS
400.0000 mg | ORAL_TABLET | Freq: Two times a day (BID) | ORAL | 0 refills | Status: DC
Start: 2022-01-18 — End: 2022-01-31

## 2022-01-18 NOTE — MAU Provider Note (Addendum)
History     CSN: [id number]  Arrival date and time: 01/18/22 1720   Event Date/Time   First Provider Initiated Contact with Patient 01/18/22 1755      Chief Complaint  Patient presents with   Contractions   Hayley Webb is a 36 y.o. [id number] at [redacted]w[redacted]d who presents today with contractions and LOF. She states that this began around 0700 today. She reports contractions are about every 2 mins at this time. She reports normal fetal movement. Patient has CHTN and is on labetalol 300mg  BID. She has taken her morning dose today. She reports HA, blurry vision that improves with blinking and RUQ pain.     OB History     Gravida  4   Para  2   Term  1   Preterm  1   AB  1   Living  2      SAB  1   IAB      Ectopic      Multiple  0   Live Births  2           Past Medical History:  Diagnosis Date   Anxiety    doing good now   Asthma    Depression    doing good   Headache(784.0)    HSV infection    Hypertension    was taken off meds after last preg   Infection    UTI   Lupus ([facility])    dx age 49   Pulmonary embolism ([facility]) 06/4/82011   STD (sexually transmitted disease) 02/2009   POSITIVE GC   UTI (urinary tract infection)     Past Surgical History:  Procedure Laterality Date   ADENOIDECTOMY     TONSILLECTOMY AND ADENOIDECTOMY  1998   WISDOM TOOTH EXTRACTION      Family History  Problem Relation Age of Onset   Asthma Mother    Healthy Father    Asthma Brother    Diabetes Maternal Aunt    Lupus Paternal Aunt    Stroke Paternal Aunt    Cancer Maternal Grandmother 72       COLON CA   Diabetes Maternal Grandmother    Hypertension Maternal Grandfather    Cancer Maternal Grandfather        prostate   Cancer Paternal Grandmother        breast   Lupus Paternal Grandmother    Anesthesia problems Neg Hx    Hypotension Neg Hx    Malignant hyperthermia Neg Hx    Pseudochol deficiency Neg Hx     Social History   Tobacco Use   Smoking status:  Never   Smokeless tobacco: Never  Vaping Use   Vaping Use: Never used  Substance Use Topics   Alcohol use: No   Drug use: No    Allergies:  Allergies  Allergen Reactions   Bactrim Anaphylaxis and Hives   Hydrocodone-Acetaminophen Anaphylaxis   Nifedipine Swelling and Anaphylaxis    Swelling of tongue   Pineapple Itching   Prednisone Anaphylaxis and Hives    Has been on Dexamethasone without issue.   Shellfish Allergy Itching   Sulfamethoxazole-Trimethoprim Other (See Comments)    Complications due to lupus   Nsaids Other (See Comments)    Has Lupus, reccommended to avoid NSAIDs   Other Itching    Shrimp:throat itching "can eat other shellfish"   Vicodin [Hydrocodone-Acetaminophen] Hives and Swelling    Can take Percocet and Tylenol without reaction.    Medications Prior  to Admission  Medication Sig Dispense Refill Last Dose   butalbital-acetaminophen-caffeine (FIORICET) 50-325-40 MG tablet Take by mouth 2 (two) times daily as needed for headache.   Past Week   enoxaparin (LOVENOX) 150 MG/ML injection Inject 150 mg into the skin every 12 (twelve) hours.   01/18/2022   hydroxychloroquine (PLAQUENIL) 200 MG tablet Take by mouth daily.   01/18/2022   labetalol (NORMODYNE) 100 MG tablet Take 2 tablets (200 mg total) by mouth 2 (two) times daily. 120 tablet 5 01/18/2022   oxyCODONE-acetaminophen (PERCOCET/ROXICET) 5-325 MG tablet Take by mouth every 4 (four) hours as needed for severe pain.   01/18/2022   albuterol (PROVENTIL) (2.5 MG/3ML) 0.083% nebulizer solution Take 3 mLs (2.5 mg total) by nebulization every 6 (six) hours as needed for wheezing or shortness of breath. 75 mL 0    albuterol (VENTOLIN HFA) 108 (90 Base) MCG/ACT inhaler Inhale 2 puffs into the lungs every 6 (six) hours as needed for wheezing or shortness of breath. 18 g 0    cholecalciferol (VITAMIN D3) 25 MCG (1000 UNIT) tablet Take 2,000 Units by mouth daily.      doxylamine, Sleep, (UNISOM) 25 MG tablet Take 1  tablet (25 mg total) by mouth every 8 (eight) hours as needed. 30 tablet 0    EPINEPHrine 0.1 MG/0.1ML SOAJ Inject 1 Dose as directed as needed (for anaphylaxis). (Patient not taking: Reported on 01/04/2022) 2 each 2    methylPREDNISolone (MEDROL) 4 MG tablet Take 4 mg by mouth daily.      Multiple Vitamin (MULTIVITAMIN) capsule Take 1 capsule by mouth daily.       Review of Systems  All other systems reviewed and are negative.  Physical Exam   Blood pressure (!) 150/88, pulse 100, temperature 97.8 F (36.6 C), temperature source Oral, resp. rate 16, last menstrual period 06/04/2021, SpO2 98 %.  Physical Exam Constitutional:      Appearance: She is well-developed.  HENT:     Head: Normocephalic.  Eyes:     Pupils: Pupils are equal, round, and reactive to light.  Cardiovascular:     Rate and Rhythm: Normal rate and regular rhythm.     Heart sounds: Normal heart sounds.  Pulmonary:     Effort: Pulmonary effort is normal. No respiratory distress.     Breath sounds: Normal breath sounds.  Abdominal:     Palpations: Abdomen is soft.     Tenderness: There is no abdominal tenderness.  Genitourinary:    Vagina: No bleeding. Vaginal discharge: mucusy.    Comments: External: no lesion Cervix: closed/thick  Vagina: small amount of white discharge     Musculoskeletal:        General: Normal range of motion.     Cervical back: Normal range of motion and neck supple.  Skin:    General: Skin is warm and dry.  Neurological:     Mental Status: She is alert and oriented to person, place, and time.  Psychiatric:        Mood and Affect: Mood normal.        Behavior: Behavior normal.     NST:  Baseline: 135 Variability: moderate Accels: 15x15 Decels: none Toco: none Reactive/Appropriate for GA  Patient Vitals for the past 24 hrs:  BP Temp Temp src Pulse Resp SpO2  01/18/22 2000 (!) 150/88 -- -- 100 -- 98 %  01/18/22 1945 (!) 146/92 -- -- 99 -- 98 %  01/18/22 1932 (!) 142/87  -- -- 95 -- 99 %  01/18/22 1917 131/77 -- -- (!) 101 -- --  01/18/22 1847 (!) 143/102 -- -- (!) 103 -- --  01/18/22 1832 (!) 140/92 -- -- (!) 105 -- --  01/18/22 1820 (!) 143/97 -- -- (!) 104 -- --  01/18/22 1745 (!) 142/94 97.8 F (36.6 C) Oral (!) 113 16 98 %     Results for orders placed or performed during the hospital encounter of 01/18/22 (from the past 24 hour(s))  Urinalysis, Routine w reflex microscopic Urine, Clean Catch     Status: Abnormal   Collection Time: 01/18/22  5:45 PM  Result Value Ref Range   Color, Urine YELLOW YELLOW   APPearance HAZY (A) CLEAR   Specific Gravity, Urine 1.020 1.005 - 1.030   pH 6.0 5.0 - 8.0   Glucose, UA NEGATIVE NEGATIVE mg/dL   Hgb urine dipstick NEGATIVE NEGATIVE   Bilirubin Urine NEGATIVE NEGATIVE   Ketones, ur NEGATIVE NEGATIVE mg/dL   Protein, ur 119 (A) NEGATIVE mg/dL   Nitrite NEGATIVE NEGATIVE   Leukocytes,Ua NEGATIVE NEGATIVE   RBC / HPF 0-5 0 - 5 RBC/hpf   WBC, UA 0-5 0 - 5 WBC/hpf   Bacteria, UA RARE (A) NONE SEEN   Squamous Epithelial / LPF 0-5 0 - 5   Mucus PRESENT   Protein / creatinine ratio, urine     Status: Abnormal   Collection Time: 01/18/22  5:45 PM  Result Value Ref Range   Creatinine, Urine 248.09 mg/dL   Total Protein, Urine 107 mg/dL   Protein Creatinine Ratio 0.43 (H) 0.00 - 0.15 mg/mg[Cre]  CBC     Status: Abnormal   Collection Time: 01/18/22  6:10 PM  Result Value Ref Range   WBC 5.8 4.0 - 10.5 K/uL   RBC 3.92 3.87 - 5.11 MIL/uL   Hemoglobin 11.1 (L) 12.0 - 15.0 g/dL   HCT 41.7 (L) 40.8 - 14.4 %   MCV 85.2 80.0 - 100.0 fL   MCH 28.3 26.0 - 34.0 pg   MCHC 33.2 30.0 - 36.0 g/dL   RDW 81.8 56.3 - 14.9 %   Platelets 303 150 - 400 K/uL   nRBC 0.0 0.0 - 0.2 %  Comprehensive metabolic panel     Status: Abnormal   Collection Time: 01/18/22  6:10 PM  Result Value Ref Range   Sodium 138 135 - 145 mmol/L   Potassium 3.6 3.5 - 5.1 mmol/L   Chloride 106 98 - 111 mmol/L   CO2 21 (L) 22 - 32 mmol/L    Glucose, Bld 107 (H) 70 - 99 mg/dL   BUN 6 6 - 20 mg/dL   Creatinine, Ser 7.02 0.44 - 1.00 mg/dL   Calcium 9.6 8.9 - 63.7 mg/dL   Total Protein 6.7 6.5 - 8.1 g/dL   Albumin 3.0 (L) 3.5 - 5.0 g/dL   AST 16 15 - 41 U/L   ALT 12 0 - 44 U/L   Alkaline Phosphatase 74 38 - 126 U/L   Total Bilirubin 0.8 0.3 - 1.2 mg/dL   GFR, Estimated >85 >88 mL/min   Anion gap 11 5 - 15  Amnisure rupture of membrane (rom)not at [facility]     Status: None   Collection Time: 01/18/22  6:18 PM  Result Value Ref Range   Amnisure ROM NEGATIVE     MAU Course  Procedures  MDM Patient with new proteinuria on P:Cr today. Will give tylenol/caffeine for headache to see if headache improves/resolves.   20:03pm: Patient reports that headache is now  a 4/10 down from an 8/10.   20:08pm: Consult with Dr. Harolyn Rutherford, will increase labetalol to 400mg  BID.   20:22pm: Spoke with Burman Foster, [name] at [facility] to have patient scheduled for an appointment early next week.   Assessment and Plan   1. Chronic hypertension with exacerbation during pregnancy in third trimester   2. Chronic hypertension with superimposed pre-eclampsia   3. [redacted] weeks gestation of pregnancy    DC home in stable condition  Pre-eclampsia warning signs reviewed  Comfort measures reviewed  3rd Trimester precautions  PTL precautions  Fetal kick counts RX: labetalol 400mg  BID  Return to MAU as needed FU with OB as planned   [facility] Follow up.   Specialty: Obstetrics and Gynecology Contact information: [address] [phone]               Kalem Rockwell DNP, [name]  01/18/22  8:22 PM

## 2022-01-18 NOTE — MAU Note (Signed)
Hayley Webb is a 36 y.o. at 100w1d here in MAU reporting: Pt reports ctx's since 0730 that are every 2 minutes. Pt reports pelvic pressure that started two days ago. Pt reports leaking clear fluid at 0900 and reports her underwear is still wet.   Onset of complaint: today 0730 Pain score: 8/10 intermittent with the ctx's  There were no vitals filed for this visit.    Lab orders placed from triage:  ua

## 2022-01-25 ENCOUNTER — Other Ambulatory Visit: Payer: Self-pay

## 2022-01-25 ENCOUNTER — Ambulatory Visit: Payer: Medicaid Other | Attending: Maternal & Fetal Medicine | Admitting: Maternal & Fetal Medicine

## 2022-01-25 ENCOUNTER — Inpatient Hospital Stay (HOSPITAL_COMMUNITY): Admit: 2022-01-25 | Payer: Medicaid Other | Admitting: Obstetrics and Gynecology

## 2022-01-25 ENCOUNTER — Ambulatory Visit (HOSPITAL_BASED_OUTPATIENT_CLINIC_OR_DEPARTMENT_OTHER): Payer: Medicaid Other

## 2022-01-25 ENCOUNTER — Ambulatory Visit: Payer: Medicaid Other | Admitting: *Deleted

## 2022-01-25 ENCOUNTER — Inpatient Hospital Stay (HOSPITAL_COMMUNITY)
Admission: AD | Admit: 2022-01-25 | Discharge: 2022-01-31 | DRG: 807 | Disposition: A | Discharge status: Home / Self Care | Payer: Medicaid Other | Attending: Obstetrics & Gynecology | Admitting: Obstetrics & Gynecology

## 2022-01-25 VITALS — BP 139/92 | HR 101

## 2022-01-25 DIAGNOSIS — O09293 Supervision of pregnancy with other poor reproductive or obstetric history, third trimester: Secondary | ICD-10-CM

## 2022-01-25 DIAGNOSIS — O10913 Unspecified pre-existing hypertension complicating pregnancy, third trimester: Secondary | ICD-10-CM

## 2022-01-25 DIAGNOSIS — O114 Pre-existing hypertension with pre-eclampsia, complicating childbirth: Secondary | ICD-10-CM | POA: Diagnosis not present

## 2022-01-25 DIAGNOSIS — O1002 Pre-existing essential hypertension complicating childbirth: Secondary | ICD-10-CM | POA: Diagnosis not present

## 2022-01-25 DIAGNOSIS — O09299 Supervision of pregnancy with other poor reproductive or obstetric history, unspecified trimester: Secondary | ICD-10-CM | POA: Insufficient documentation

## 2022-01-25 DIAGNOSIS — O1414 Severe pre-eclampsia complicating childbirth: Secondary | ICD-10-CM | POA: Diagnosis not present

## 2022-01-25 DIAGNOSIS — O99513 Diseases of the respiratory system complicating pregnancy, third trimester: Secondary | ICD-10-CM

## 2022-01-25 DIAGNOSIS — Z7901 Long term (current) use of anticoagulants: Secondary | ICD-10-CM

## 2022-01-25 DIAGNOSIS — O99892 Other specified diseases and conditions complicating childbirth: Secondary | ICD-10-CM | POA: Diagnosis present

## 2022-01-25 DIAGNOSIS — J45909 Unspecified asthma, uncomplicated: Secondary | ICD-10-CM

## 2022-01-25 DIAGNOSIS — O99213 Obesity complicating pregnancy, third trimester: Secondary | ICD-10-CM | POA: Diagnosis not present

## 2022-01-25 DIAGNOSIS — Z886 Allergy status to analgesic agent status: Secondary | ICD-10-CM | POA: Diagnosis not present

## 2022-01-25 DIAGNOSIS — O99891 Other specified diseases and conditions complicating pregnancy: Secondary | ICD-10-CM | POA: Insufficient documentation

## 2022-01-25 DIAGNOSIS — Z3A32 32 weeks gestation of pregnancy: Secondary | ICD-10-CM | POA: Diagnosis not present

## 2022-01-25 DIAGNOSIS — O1490 Unspecified pre-eclampsia, unspecified trimester: Secondary | ICD-10-CM

## 2022-01-25 DIAGNOSIS — I2782 Chronic pulmonary embolism: Secondary | ICD-10-CM

## 2022-01-25 DIAGNOSIS — M329 Systemic lupus erythematosus, unspecified: Secondary | ICD-10-CM | POA: Diagnosis present

## 2022-01-25 DIAGNOSIS — O10013 Pre-existing essential hypertension complicating pregnancy, third trimester: Secondary | ICD-10-CM | POA: Diagnosis not present

## 2022-01-25 DIAGNOSIS — O09899 Supervision of other high risk pregnancies, unspecified trimester: Secondary | ICD-10-CM | POA: Insufficient documentation

## 2022-01-25 DIAGNOSIS — O10919 Unspecified pre-existing hypertension complicating pregnancy, unspecified trimester: Secondary | ICD-10-CM

## 2022-01-25 DIAGNOSIS — O09213 Supervision of pregnancy with history of pre-term labor, third trimester: Secondary | ICD-10-CM

## 2022-01-25 DIAGNOSIS — Z86711 Personal history of pulmonary embolism: Secondary | ICD-10-CM

## 2022-01-25 DIAGNOSIS — O99214 Obesity complicating childbirth: Secondary | ICD-10-CM | POA: Diagnosis not present

## 2022-01-25 DIAGNOSIS — I2690 Septic pulmonary embolism without acute cor pulmonale: Secondary | ICD-10-CM | POA: Diagnosis not present

## 2022-01-25 DIAGNOSIS — E669 Obesity, unspecified: Secondary | ICD-10-CM

## 2022-01-25 HISTORY — DX: Unspecified pre-eclampsia, unspecified trimester: O14.90

## 2022-01-25 LAB — CBC
HCT: 33.7 % — ABNORMAL LOW (ref 36.0–46.0)
Hemoglobin: 11.1 g/dL — ABNORMAL LOW (ref 12.0–15.0)
MCH: 28.4 pg (ref 26.0–34.0)
MCHC: 32.9 g/dL (ref 30.0–36.0)
MCV: 86.2 fL (ref 80.0–100.0)
Platelets: 321 10*3/uL (ref 150–400)
RBC: 3.91 MIL/uL (ref 3.87–5.11)
RDW: 13.9 % (ref 11.5–15.5)
WBC: 5.6 10*3/uL (ref 4.0–10.5)
nRBC: 0 % (ref 0.0–0.2)

## 2022-01-25 LAB — LACTATE DEHYDROGENASE: LDH: 120 U/L (ref 98–192)

## 2022-01-25 LAB — AMNISURE RUPTURE OF MEMBRANE (ROM) NOT AT ARMC: Amnisure ROM: NEGATIVE

## 2022-01-25 LAB — COMPREHENSIVE METABOLIC PANEL
ALT: 13 U/L (ref 0–44)
AST: 17 U/L (ref 15–41)
Albumin: 2.8 g/dL — ABNORMAL LOW (ref 3.5–5.0)
Alkaline Phosphatase: 67 U/L (ref 38–126)
Anion gap: 9 (ref 5–15)
BUN: 6 mg/dL (ref 6–20)
CO2: 20 mmol/L — ABNORMAL LOW (ref 22–32)
Calcium: 8.9 mg/dL (ref 8.9–10.3)
Chloride: 105 mmol/L (ref 98–111)
Creatinine, Ser: 0.61 mg/dL (ref 0.44–1.00)
GFR, Estimated: >60 mL/min (ref >60)
Glucose, Bld: 98 mg/dL (ref 70–99)
Potassium: 3.4 mmol/L — ABNORMAL LOW (ref 3.5–5.1)
Sodium: 134 mmol/L — ABNORMAL LOW (ref 135–145)
Total Bilirubin: 0.4 mg/dL (ref 0.3–1.2)
Total Protein: 6.2 g/dL — ABNORMAL LOW (ref 6.5–8.1)

## 2022-01-25 LAB — PROTEIN / CREATININE RATIO, URINE
Creatinine, Urine: 302.66 mg/dL
Protein Creatinine Ratio: 0.16 mg/mg{Cre} — ABNORMAL HIGH (ref 0.00–0.15)
Total Protein, Urine: 48 mg/dL

## 2022-01-25 LAB — TYPE AND SCREEN
ABO/RH(D): O POS
Antibody Screen: NEGATIVE

## 2022-01-25 LAB — WET PREP, GENITAL
Clue Cells Wet Prep HPF POC: NONE SEEN
Sperm: NONE SEEN
Trich, Wet Prep: NONE SEEN
WBC, Wet Prep HPF POC: <10 (ref <10)
Yeast Wet Prep HPF POC: NONE SEEN

## 2022-01-25 LAB — GROUP B STREP BY PCR: Group B strep by PCR: NEGATIVE

## 2022-01-25 LAB — URIC ACID: Uric Acid, Serum: 3.3 mg/dL (ref 2.5–7.1)

## 2022-01-25 LAB — TSH: TSH: 0.856 u[IU]/mL (ref 0.350–4.500)

## 2022-01-25 MED ORDER — VITAMIN D 25 MCG (1000 UNIT) PO TABS
4000.0000 [IU] | ORAL_TABLET | Freq: Every day | ORAL | Status: DC
  Administered 2022-01-26 – 2022-01-27 (×2): 4000 [IU] via ORAL
  Filled 2022-01-25 (×3): qty 4

## 2022-01-25 MED ORDER — ALBUTEROL SULFATE HFA 108 (90 BASE) MCG/ACT IN AERS: 2.0000 | INHALATION_SPRAY | Freq: Four times a day (QID) | RESPIRATORY_TRACT | Status: DC | PRN

## 2022-01-25 MED ORDER — BETAMETHASONE SOD PHOS & ACET 6 (3-3) MG/ML IJ SUSP
12.0000 mg | INTRAMUSCULAR | Status: AC
  Administered 2022-01-25 – 2022-01-26 (×2): 12 mg via INTRAMUSCULAR
  Filled 2022-01-25: qty 5

## 2022-01-25 MED ORDER — ZOLPIDEM TARTRATE 5 MG PO TABS: 5.0000 mg | ORAL_TABLET | Freq: Every evening | ORAL | Status: DC | PRN

## 2022-01-25 MED ORDER — LACTATED RINGERS IV SOLN: INTRAVENOUS | Status: DC

## 2022-01-25 MED ORDER — EPINEPHRINE 0.1 MG/0.1ML IJ SOAJ: 1.0000 | INTRAMUSCULAR | Status: DC | PRN

## 2022-01-25 MED ORDER — DOCUSATE SODIUM 100 MG PO CAPS
100.0000 mg | ORAL_CAPSULE | Freq: Every day | ORAL | Status: DC
  Administered 2022-01-26 – 2022-01-28 (×3): 100 mg via ORAL
  Filled 2022-01-25 (×3): qty 1

## 2022-01-25 MED ORDER — ALBUTEROL SULFATE (2.5 MG/3ML) 0.083% IN NEBU: 2.5000 mg | INHALATION_SOLUTION | Freq: Four times a day (QID) | RESPIRATORY_TRACT | Status: DC | PRN

## 2022-01-25 MED ORDER — MAGNESIUM SULFATE 40 GM/1000ML IV SOLN
2.0000 g/h | INTRAVENOUS | Status: AC
  Administered 2022-01-26 – 2022-01-28 (×3): 2 g/h via INTRAVENOUS
  Filled 2022-01-25 (×4): qty 1000

## 2022-01-25 MED ORDER — MAGNESIUM SULFATE BOLUS VIA INFUSION
4.0000 g | Freq: Once | INTRAVENOUS | Status: AC
  Administered 2022-01-25: 4 g via INTRAVENOUS
  Filled 2022-01-25: qty 1000

## 2022-01-25 MED ORDER — CALCIUM CARBONATE ANTACID 500 MG PO CHEW: 2.0000 | CHEWABLE_TABLET | ORAL | Status: DC | PRN

## 2022-01-25 MED ORDER — PRENATAL MULTIVITAMIN CH
1.0000 | ORAL_TABLET | Freq: Every day | ORAL | Status: DC
  Administered 2022-01-26 – 2022-01-27 (×2): 1 via ORAL
  Filled 2022-01-25 (×2): qty 1

## 2022-01-25 MED ORDER — HYDROMORPHONE HCL 1 MG/ML IJ SOLN
1.0000 mg | INTRAMUSCULAR | Status: DC | PRN
  Administered 2022-01-25 – 2022-01-27 (×12): 1 mg via INTRAVENOUS
  Filled 2022-01-25 (×12): qty 1

## 2022-01-25 MED ORDER — ENOXAPARIN SODIUM 150 MG/ML IJ SOSY
150.0000 mg | PREFILLED_SYRINGE | Freq: Two times a day (BID) | INTRAMUSCULAR | Status: DC
  Administered 2022-01-25 – 2022-01-26 (×3): 150 mg via SUBCUTANEOUS
  Filled 2022-01-25 (×4): qty 1

## 2022-01-25 MED ORDER — METOCLOPRAMIDE HCL 5 MG/ML IJ SOLN
10.0000 mg | Freq: Four times a day (QID) | INTRAMUSCULAR | Status: DC | PRN
  Administered 2022-01-26 – 2022-01-28 (×5): 10 mg via INTRAVENOUS
  Filled 2022-01-25 (×5): qty 2

## 2022-01-25 MED ORDER — VALACYCLOVIR HCL 500 MG PO TABS
500.0000 mg | ORAL_TABLET | Freq: Two times a day (BID) | ORAL | Status: DC
Start: 2022-01-25 — End: 2022-01-31
  Administered 2022-01-25 – 2022-01-31 (×12): 500 mg via ORAL
  Filled 2022-01-25 (×12): qty 1

## 2022-01-25 MED ORDER — HYDROXYCHLOROQUINE SULFATE 200 MG PO TABS
200.0000 mg | ORAL_TABLET | Freq: Every day | ORAL | Status: DC
Start: 2022-01-26 — End: 2022-01-31
  Administered 2022-01-26 – 2022-01-31 (×6): 200 mg via ORAL
  Filled 2022-01-25 (×6): qty 1

## 2022-01-25 MED ORDER — LABETALOL HCL 200 MG PO TABS
400.0000 mg | ORAL_TABLET | Freq: Two times a day (BID) | ORAL | Status: DC
Start: 2022-01-25 — End: 2022-01-31
  Administered 2022-01-25 – 2022-01-31 (×12): 400 mg via ORAL
  Filled 2022-01-25 (×12): qty 2

## 2022-01-25 MED ORDER — ASPIRIN 81 MG PO TBEC
81.0000 mg | DELAYED_RELEASE_TABLET | Freq: Every day | ORAL | Status: DC
  Administered 2022-01-25 – 2022-01-27 (×3): 81 mg via ORAL
  Filled 2022-01-25 (×4): qty 1

## 2022-01-25 MED ORDER — BUTALBITAL-APAP-CAFFEINE 50-325-40 MG PO TABS
1.0000 | ORAL_TABLET | Freq: Four times a day (QID) | ORAL | Status: DC | PRN
  Administered 2022-01-25 – 2022-01-28 (×4): 2 via ORAL
  Administered 2022-01-28 – 2022-01-30 (×2): 1 via ORAL
  Administered 2022-01-30: 2 via ORAL
  Filled 2022-01-25: qty 2
  Filled 2022-01-25: qty 1
  Filled 2022-01-25 (×4): qty 2
  Filled 2022-01-25: qty 1

## 2022-01-25 NOTE — Progress Notes (Signed)
Pt c.o of headache today and every day for the past two weeks that is not relieved by fioricet. She reports blurred vision several times a week with glasses on and off; and a dull, intermittent  ache in her RUQ.

## 2022-01-25 NOTE — Progress Notes (Signed)
MFM Brief Note  Hayley Webb is a 36 yo G4P2 who is at 32 w 1d . She is seen today for antenatal testing at the request of Hayley Webb, [name].  She has a known history of SLE with antiphospholipid antibodies. She has known CHTN with a prior delivery secondary to preeclampsia with severe features due to persistent headache.  She presents today with persistent headache for the last two weeks. She is taking Fioricet which was effective but is no longer.   Her blood pressure was 130/90's. She has recent [facility] labs with a UPC of 0.43 and normal CBC and CMP. This gave her presumed preeclampsia diagnosis.  Today we observed a BPP 8/8.  I reviewed today's visit with her and recommended she go to [facility] specialty care for a direct admission.   I discussed with Dr. Su Hilt that plan for magnesium and BMZ administration. Repeat labs.  If the headache resolves but returns continue hospitalization with delivery at 34 weeks.   If the headache cannot be resolved consider delivery at [facility] course is completed.  All questions answered both Dr. Su Hilt and Hayley Webb are in agreement with this plan.   I spent 20 minutes with > 50% in face to face consultation.  Hayley Olive, MD

## 2022-01-25 NOTE — H&P (Signed)
Hayley Webb is a 36 y.o. female presenting for pre-eclampsia with severe features by HA, intermittent RUQ pain and blurry vision.  No RUQ pain right now though.  I was contacted today by Hayley Webb after she was seen there with mildly elevated BP and h/o of UPCR 0.4 now with CNS sxs.  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  Pt was seen not long ago for ?LOF and amnisure negative.  She however continues to feel wet and fluid she reported as low although upon reviewing report AFI is 9.  OB History     Gravida  4   Para  2   Term  1   Preterm  1   AB  1   Living  2      SAB  1   IAB      Ectopic      Multiple  0   Live Births  2          Past Medical History:  Diagnosis Date   Anxiety    doing good now   Asthma    Depression    doing good   Headache(784.0)    HSV infection    Hypertension    was taken off meds after last preg   Infection    UTI   Lupus [facility])    dx age 35   Pulmonary embolism ([facility]) 06/4/82011   STD (sexually transmitted disease) 02/2009   POSITIVE GC   UTI (urinary tract infection)    Past Surgical History:  Procedure Laterality Date   ADENOIDECTOMY     TONSILLECTOMY AND ADENOIDECTOMY  1998   WISDOM TOOTH EXTRACTION     Family History: family history includes Asthma in her brother and mother; Cancer in her maternal grandfather and paternal grandmother; Cancer (age of onset: 73) in her maternal grandmother; Diabetes in her maternal aunt and maternal grandmother; Healthy in her father; Hypertension in her maternal grandfather; Lupus in her paternal aunt and paternal grandmother; Stroke in her paternal aunt. Social History:  reports that she has never smoked. She has never used smokeless tobacco. She reports that she does not drink alcohol and does not use drugs.     Maternal Diabetes: No Genetic Screening: Normal Maternal Ultrasounds/Referrals: Normal Fetal Ultrasounds or other Referrals:  None Maternal Substance Abuse:   No Significant Maternal Medications:  Meds include: Other: lovenox, valacyclovir, BMZ, albuterol, hydroxychloroquine Significant Maternal Lab Results:  Other:  Other Comments:  Nonenone  Review of Systems Denies F/C/N/V/D  History   Blood pressure 135/78, pulse (!) 108, temperature 98.1 F (36.7 C), temperature source Oral, resp. rate 16, last menstrual period 06/04/2021, SpO2 97 %. Exam Physical Exam   Lungs CTA CV RRR Abd gravid, NT Ext no calf tenderness  Prenatal labs: ABO, Rh:  A+ Antibody:  negative Rubella:  Immune RPR:   NR HBsAg:   negative HIV:   NR GBS:   pending  Assessment/Plan: P2 at 32 3/7 wks admitted with pre-eclampsia with severe features. See Hayley Webb note below. Hydroxychloroquine restarted for lupus along with an 81mg  aspirin Will start mg for seizure prophylaxis Cont lovenox for h/o PE x 3 in 2018 Labetalol 400mg  bid for h/o CHTN NST q shift Wet prep now Amnisure now GBS PCR now   MFM Brief Note   Hayley Webb is a 36 yo G4P2 who is at 32 w 1d . She is seen today for antenatal testing at the request of Hayley Malta  Emilee Webb, [name].   She has a known history of SLE with antiphospholipid antibodies. She has known CHTN with a prior delivery secondary to preeclampsia with severe features due to persistent headache.   She presents today with persistent headache for the last two weeks. She is taking Fioricet which was effective but is no longer.    Her blood pressure was 130/90's. She has recent [facility] labs with a UPC of 0.43 and normal CBC and CMP. This gave her presumed preeclampsia diagnosis.   Today we observed a BPP 8/8.   I reviewed today's visit with her and recommended she go to [facility] specialty care for a direct admission.    I discussed with Hayley Webb that plan for magnesium and BMZ administration. Repeat labs.   If the headache resolves but returns continue hospitalization with delivery at 34 weeks.    If the headache cannot be resolved  consider delivery at [facility] course is completed.   All questions answered both Hayley Webb and Hayley Webb are in agreement with this plan.    I spent 20 minutes with > 50% in face to face consultation.   Hayley Olive, MD   [facility] 01/25/2022, 6:01 PM

## 2022-01-26 DIAGNOSIS — O1493 Unspecified pre-eclampsia, third trimester: Secondary | ICD-10-CM | POA: Diagnosis not present

## 2022-01-26 DIAGNOSIS — Z3A32 32 weeks gestation of pregnancy: Secondary | ICD-10-CM | POA: Diagnosis not present

## 2022-01-26 DIAGNOSIS — M329 Systemic lupus erythematosus, unspecified: Secondary | ICD-10-CM | POA: Diagnosis not present

## 2022-01-26 LAB — COMPREHENSIVE METABOLIC PANEL
ALT: 11 U/L (ref 0–44)
AST: 16 U/L (ref 15–41)
Albumin: 2.8 g/dL — ABNORMAL LOW (ref 3.5–5.0)
Alkaline Phosphatase: 70 U/L (ref 38–126)
Anion gap: 13 (ref 5–15)
BUN: 6 mg/dL (ref 6–20)
CO2: 17 mmol/L — ABNORMAL LOW (ref 22–32)
Calcium: 8.4 mg/dL — ABNORMAL LOW (ref 8.9–10.3)
Chloride: 102 mmol/L (ref 98–111)
Creatinine, Ser: 0.61 mg/dL (ref 0.44–1.00)
GFR, Estimated: >60 mL/min (ref >60)
Glucose, Bld: 203 mg/dL — ABNORMAL HIGH (ref 70–99)
Potassium: 3.4 mmol/L — ABNORMAL LOW (ref 3.5–5.1)
Sodium: 132 mmol/L — ABNORMAL LOW (ref 135–145)
Total Bilirubin: 0.2 mg/dL — ABNORMAL LOW (ref 0.3–1.2)
Total Protein: 6.4 g/dL — ABNORMAL LOW (ref 6.5–8.1)

## 2022-01-26 LAB — CBC
HCT: 32.4 % — ABNORMAL LOW (ref 36.0–46.0)
Hemoglobin: 10.9 g/dL — ABNORMAL LOW (ref 12.0–15.0)
MCH: 28.9 pg (ref 26.0–34.0)
MCHC: 33.6 g/dL (ref 30.0–36.0)
MCV: 85.9 fL (ref 80.0–100.0)
Platelets: 302 10*3/uL (ref 150–400)
RBC: 3.77 MIL/uL — ABNORMAL LOW (ref 3.87–5.11)
RDW: 13.6 % (ref 11.5–15.5)
WBC: 7.6 10*3/uL (ref 4.0–10.5)
nRBC: 0 % (ref 0.0–0.2)

## 2022-01-26 LAB — MAGNESIUM: Magnesium: 3.5 mg/dL — ABNORMAL HIGH (ref 1.7–2.4)

## 2022-01-26 MED ORDER — LACTATED RINGERS IV BOLUS
1000.0000 mL | Freq: Once | INTRAVENOUS | Status: AC
  Administered 2022-01-26: 1000 mL via INTRAVENOUS

## 2022-01-26 MED ORDER — OXYCODONE HCL 5 MG PO TABS
5.0000 mg | ORAL_TABLET | Freq: Once | ORAL | Status: AC
  Administered 2022-01-26: 5 mg via ORAL
  Filled 2022-01-26: qty 1

## 2022-01-26 MED ORDER — ONDANSETRON HCL 4 MG/2ML IJ SOLN
4.0000 mg | Freq: Four times a day (QID) | INTRAMUSCULAR | Status: DC | PRN
  Administered 2022-01-26: 4 mg via INTRAVENOUS
  Filled 2022-01-26: qty 2

## 2022-01-26 NOTE — Progress Notes (Signed)
Pt reports minimal relief for HA after Roxicodone 5 mg, but declines repeat dose. According to the pt, this type of intractable HA was the reason she was induced with her last pregnancy @ 36 wks. Pt reports being able to rest in a darkened room with minimal stimulation. Frontal HA without visual changes or RUQ pain.   Mainline IV fluids increased from 10 mL/hr to 75 mL/hr  Mag sulfate 2 G/hr at 50 mL/hr For an IV fluid total intake of 125 mL/hr Will update MD  Rhea Pink, DNP, [name] 01/26/2022 3:40 AM

## 2022-01-26 NOTE — Progress Notes (Signed)
Pt with c/o headaches and blurred vision.  No SOB or RUQ pain  No leakage of fluid or VB.  Good FM  BP 120/65 (BP Location: Right Arm)   Pulse 92   Temp 98.2 F (36.8 C) (Oral)   Resp 18   Ht 5' 8.5" (1.74 m)   Wt (!) 150.9 kg   LMP 06/04/2021   SpO2 97%   BMI 49.84 kg/m   FHTS Baseline: 140 bpm  Toco none  Pt in NAD CV RRR Lungs CTAB abd  Gravid soft and NT GU no vb EXt no calf tenderness 2 plus DTR  Results for orders placed or performed during the hospital encounter of 01/25/22 (from the past 72 hour(s))  Protein / creatinine ratio, urine     Status: Abnormal   Collection Time: 01/25/22  6:50 PM  Result Value Ref Range   Creatinine, Urine 302.66 mg/dL   Total Protein, Urine 48 mg/dL    Comment: NO NORMAL RANGE ESTABLISHED FOR THIS TEST   Protein Creatinine Ratio 0.16 (H) 0.00 - 0.15 mg/mg[Cre]    Comment: Performed at [facility], [address] [place], [place] [address]  Group B strep by PCR     Status: None   Collection Time: 01/25/22  6:50 PM   Specimen: Vaginal/Rectal; Genital  Result Value Ref Range   Group B strep by PCR NEGATIVE NEGATIVE    Comment: (NOTE) Intrapartum testing with Xpert GBS assay should be used as an adjunct to other methods available and not used to replace antepartum testing (at 35-[redacted] weeks gestation). Performed at [facility], [address], [place], [place] [address]   Wet prep, genital     Status: None   Collection Time: 01/25/22  6:50 PM   Specimen: Vaginal/Rectal  Result Value Ref Range   Yeast Wet Prep HPF POC NONE SEEN NONE SEEN   Trich, Wet Prep NONE SEEN NONE SEEN   Clue Cells Wet Prep HPF POC NONE SEEN NONE SEEN   WBC, Wet Prep HPF POC <10 <10   Sperm NONE SEEN     Comment: Performed at [facility], [address] [place], [place] [address]  Amnisure rupture of membrane (rom)not at [facility]     Status: None   Collection Time: 01/25/22  6:50 PM  Result Value Ref Range   Amnisure ROM NEGATIVE      Comment: Performed at [facility], [address] [place], [place] [address]  Type and screen [facility]     Status: None   Collection Time: 01/25/22  6:53 PM  Result Value Ref Range   ABO/RH(D) O POS    Antibody Screen NEG    Sample Expiration      01/28/2022,2359 Performed at [facility], [address], [place], [place] [address]   TSH     Status: None   Collection Time: 01/25/22  6:54 PM  Result Value Ref Range   TSH 0.856 0.350 - 4.500 uIU/mL    Comment: Performed by a 3rd Generation assay with a functional sensitivity of <=0.01 uIU/mL. Performed at [facility], [address] [place], [place] [address]   Comprehensive metabolic panel     Status: Abnormal   Collection Time: 01/25/22  6:54 PM  Result Value Ref Range   Sodium 134 (L) 135 - 145 mmol/L   Potassium 3.4 (L) 3.5 - 5.1 mmol/L   Chloride 105 98 - 111 mmol/L  CO2 20 (L) 22 - 32 mmol/L   Glucose, Bld 98 70 - 99 mg/dL    Comment: Glucose reference range applies only to samples taken after fasting for at least 8 hours.   BUN 6 6 - 20 mg/dL   Creatinine, Ser 8.41 0.44 - 1.00 mg/dL   Calcium 8.9 8.9 - 66.0 mg/dL   Total Protein 6.2 (L) 6.5 - 8.1 g/dL   Albumin 2.8 (L) 3.5 - 5.0 g/dL   AST 17 15 - 41 U/L   ALT 13 0 - 44 U/L   Alkaline Phosphatase 67 38 - 126 U/L   Total Bilirubin 0.4 0.3 - 1.2 mg/dL   GFR, Estimated >63 >01 mL/min    Comment: (NOTE) Calculated using the CKD-EPI Creatinine Equation (2021)    Anion gap 9 5 - 15    Comment: Performed at [facility], [address], [place], [place] [address]  Uric acid     Status: None   Collection Time: 01/25/22  6:54 PM  Result Value Ref Range   Uric Acid, Serum 3.3 2.5 - 7.1 mg/dL    Comment: Performed at [facility], [address] [place], [place] [address]  Lactate dehydrogenase     Status: None   Collection Time: 01/25/22  6:54 PM  Result Value Ref Range   LDH 120 98 - 192 U/L    Comment: Performed at  [facility], [address], [place], [place] [address]  CBC     Status: Abnormal   Collection Time: 01/25/22  6:54 PM  Result Value Ref Range   WBC 5.6 4.0 - 10.5 K/uL   RBC 3.91 3.87 - 5.11 MIL/uL   Hemoglobin 11.1 (L) 12.0 - 15.0 g/dL   HCT 25.4 (L) 27.0 - 62.3 %   MCV 86.2 80.0 - 100.0 fL   MCH 28.4 26.0 - 34.0 pg   MCHC 32.9 30.0 - 36.0 g/dL   RDW 76.2 83.1 - 51.7 %   Platelets 321 150 - 400 K/uL   nRBC 0.0 0.0 - 0.2 %    Comment: Performed at [facility], [address], [place], [place] [address]  Magnesium     Status: Abnormal   Collection Time: 01/26/22  3:54 AM  Result Value Ref Range   Magnesium 3.5 (H) 1.7 - 2.4 mg/dL    Comment: Performed at [facility], [address] [place], [place] [address]  CBC     Status: Abnormal   Collection Time: 01/26/22  3:54 AM  Result Value Ref Range   WBC 7.6 4.0 - 10.5 K/uL   RBC 3.77 (L) 3.87 - 5.11 MIL/uL   Hemoglobin 10.9 (L) 12.0 - 15.0 g/dL   HCT 26.9 (L) 48.5 - 46.2 %   MCV 85.9 80.0 - 100.0 fL   MCH 28.9 26.0 - 34.0 pg   MCHC 33.6 30.0 - 36.0 g/dL   RDW 70.3 50.0 - 93.8 %   Platelets 302 150 - 400 K/uL   nRBC 0.0 0.0 - 0.2 %    Comment: Performed at [facility], [address] [place], [place] [address]  Comprehensive metabolic panel     Status: Abnormal   Collection Time: 01/26/22  3:54 AM  Result Value Ref Range   Sodium 132 (L) 135 - 145 mmol/L   Potassium 3.4 (L) 3.5 - 5.1 mmol/L   Chloride 102 98 - 111 mmol/L   CO2 17 (L) 22 - 32  mmol/L   Glucose, Bld 203 (H) 70 - 99 mg/dL    Comment: Glucose reference range applies only to samples taken after fasting for at least 8 hours.   BUN 6 6 - 20 mg/dL   Creatinine, Ser 0.62 0.44 - 1.00 mg/dL   Calcium 8.4 (L) 8.9 - 10.3 mg/dL   Total Protein 6.4 (L) 6.5 - 8.1 g/dL   Albumin 2.8 (L) 3.5 - 5.0 g/dL   AST 16 15 - 41 U/L   ALT 11 0 - 44 U/L   Alkaline Phosphatase 70 38 - 126 U/L   Total Bilirubin 0.2 (L) 0.3 - 1.2 mg/dL   GFR, Estimated >69  >48 mL/min    Comment: (NOTE) Calculated using the CKD-EPI Creatinine Equation (2021)    Anion gap 13 5 - 15    Comment: Performed at [facility], [address] [place], [place] [address]    Assessment and Plan [redacted]w[redacted]d Preeclampsia with severe features.  Magnesium for 48 hours to allow for BMZ If headache has not resolved will proceed with IOL Nicu consult.  IF NICU full at IOL will consider transfer of the pt.  The pt agrees with the plan Pt is stable .  Will watch closely   Patient ID: Hayley Webb, female   DOB: 1985/12/22, 36 y.o.   MRN: [id number]

## 2022-01-26 NOTE — Progress Notes (Signed)
Headache did not respond to IV Dilaudid. Per pt, HA historically responds well to Fioricet despite allergy to acetaminophen. HA did not respond to Fioricet. Dr. [name] Dopp notified and orders received to administer Roxicodone 5mg  PO now and may repeat in 1 hour if HA remains refractory to treatment.   , DNP, [name] 01/26/2022 2:23 AM

## 2022-01-26 NOTE — Consult Note (Signed)
[facility]  Prenatal Consult       01/26/2022  7:53 PM   I was asked by Dr. Normand Sloop to consult on this patient for anticipated preterm delivery. I had the pleasure of meeting with Ms. Scaletta today with her husband on the phone during the consultation. She is a 36yo G4P2 woman currently at [redacted]w[redacted]d gestation. She and her partner are expecting a baby boy, to be named Chief Operating Officer. 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 Fetal echocardiogram during this pregnancy was structurally normal and without signs of heart block or carditis (ECG is recommended post-natally).  She completed her second dose of betamethasone at 1900 today.  Of note, she has a son, now 83 years old, who was born at 40 weeks due to pre-E and he did not require a NICU stay. Her first child had heart block which was treated and never recurred.  I explained that the neonatal intensive care team would be present for the delivery and outlined the likely delivery room course for this baby including routine resuscitation and NRP-guided approaches to the treatment of respiratory distress. We discussed other common problems associated with prematurity including respiratory distress syndrome/CLD, apnea, feeding issues, temperature regulation. We briefly discussed IVH/PVL, ROP, and NEC and that these are complications associated with prematurity, but that by 32 weeks are uncommon.   We discussed the average length of stay but I noted that the actual LOS would depend on the severity of problems encountered and response to treatments. We discussed visitation policies and the resources available while her child is in the hospital.  We discussed the importance of good nutrition and various methods of providing  nutrition (parenteral hyperalimentation, gavage feedings and/or oral feeding). We discussed the benefits of human milk. She breast fed her two other children. I encouraged pumping soon after birth, discussed oral care with colostrum, encouraged frequent skin-to-skin as able, and outlined resources that are available to support breast feeding. We discussed the possibility of using donor breast milk as a bridge to which is is amenable.   We briefly discussed the current high NICU census and the possibility of her needing to be transferred prior to induction or the chance that [facility] may need to be transferred after delivery after stabilization if the NICU is full. However, I emphasized that we hope to avoid transferring either of them and allow them to continue to be cared for here. She and father expressed understanding.  Thank you for involving [place] in the care of this patient. A member of our team will be available should the family have additional questions.  Time for consultation: approximately 20 minutes of face-to-face time in discussion of the risks and medical care associated with preterm delivery.  Jacob Moores, MD Neonatal Medicine

## 2022-01-27 ENCOUNTER — Encounter (HOSPITAL_COMMUNITY): Payer: Self-pay | Admitting: Obstetrics and Gynecology

## 2022-01-27 DIAGNOSIS — Z3A32 32 weeks gestation of pregnancy: Secondary | ICD-10-CM | POA: Diagnosis not present

## 2022-01-27 DIAGNOSIS — O1490 Unspecified pre-eclampsia, unspecified trimester: Secondary | ICD-10-CM | POA: Diagnosis not present

## 2022-01-27 LAB — CBC
HCT: 30.5 % — ABNORMAL LOW (ref 36.0–46.0)
Hemoglobin: 10.3 g/dL — ABNORMAL LOW (ref 12.0–15.0)
MCH: 28.6 pg (ref 26.0–34.0)
MCHC: 33.8 g/dL (ref 30.0–36.0)
MCV: 84.7 fL (ref 80.0–100.0)
Platelets: 291 10*3/uL (ref 150–400)
RBC: 3.6 MIL/uL — ABNORMAL LOW (ref 3.87–5.11)
RDW: 13.9 % (ref 11.5–15.5)
WBC: 8.7 10*3/uL (ref 4.0–10.5)
nRBC: 0 % (ref 0.0–0.2)

## 2022-01-27 LAB — COMPREHENSIVE METABOLIC PANEL
ALT: 11 U/L (ref 0–44)
AST: 16 U/L (ref 15–41)
Albumin: 2.7 g/dL — ABNORMAL LOW (ref 3.5–5.0)
Alkaline Phosphatase: 61 U/L (ref 38–126)
Anion gap: 11 (ref 5–15)
BUN: <5 mg/dL — ABNORMAL LOW (ref 6–20)
CO2: 18 mmol/L — ABNORMAL LOW (ref 22–32)
Calcium: 7.8 mg/dL — ABNORMAL LOW (ref 8.9–10.3)
Chloride: 106 mmol/L (ref 98–111)
Creatinine, Ser: 0.68 mg/dL (ref 0.44–1.00)
GFR, Estimated: >60 mL/min (ref >60)
Glucose, Bld: 174 mg/dL — ABNORMAL HIGH (ref 70–99)
Potassium: 4 mmol/L (ref 3.5–5.1)
Sodium: 135 mmol/L (ref 135–145)
Total Bilirubin: 0.3 mg/dL (ref 0.3–1.2)
Total Protein: 6.2 g/dL — ABNORMAL LOW (ref 6.5–8.1)

## 2022-01-27 LAB — MAGNESIUM: Magnesium: 4.4 mg/dL — ABNORMAL HIGH (ref 1.7–2.4)

## 2022-01-27 NOTE — Progress Notes (Signed)
Subjective:    Headache persists and has been refractory to all treatment. Mild, occasional blurry vision, denies RUQ . Denies abd rwe  Objective:    VS: BP 122/64   Pulse 85   Temp 97.6 F (36.4 C) (Oral)   Resp 18   Ht 5' 8.5" (1.74 m)   Wt (!) 150.9 kg   LMP 06/04/2021   SpO2 97%   BMI 49.84 kg/m  FHR : baseline 120 / variability moderate / accelerations present / sbdrny decelerations Toco: none Membranes: PPROM     Assessment/Plan:   36 y.o. [id number] [redacted]w[redacted]d PPROM x   Pre-eclampsia w/ severe features    -magnesium to be discontinued at 1900    -mild range  BP since delivery Category 1 GBS neg Pt to be induced this evening  NICU consult completed 01/26/22  Roma Schanz DNP, [name] 01/27/2022 7:44 AM

## 2022-01-28 ENCOUNTER — Inpatient Hospital Stay (HOSPITAL_COMMUNITY): Payer: Medicaid Other | Admitting: Anesthesiology

## 2022-01-28 ENCOUNTER — Encounter (HOSPITAL_COMMUNITY): Payer: Self-pay | Admitting: Obstetrics and Gynecology

## 2022-01-28 DIAGNOSIS — Z3A32 32 weeks gestation of pregnancy: Secondary | ICD-10-CM | POA: Diagnosis not present

## 2022-01-28 DIAGNOSIS — O1490 Unspecified pre-eclampsia, unspecified trimester: Secondary | ICD-10-CM | POA: Diagnosis not present

## 2022-01-28 LAB — MAGNESIUM: Magnesium: 4.1 mg/dL — ABNORMAL HIGH (ref 1.7–2.4)

## 2022-01-28 LAB — CBC
HCT: 30.4 % — ABNORMAL LOW (ref 36.0–46.0)
HCT: 32 % — ABNORMAL LOW (ref 36.0–46.0)
Hemoglobin: 9.8 g/dL — ABNORMAL LOW (ref 12.0–15.0)
Hemoglobin: 10.6 g/dL — ABNORMAL LOW (ref 12.0–15.0)
MCH: 28.2 pg (ref 26.0–34.0)
MCH: 28.5 pg (ref 26.0–34.0)
MCHC: 32.2 g/dL (ref 30.0–36.0)
MCHC: 33.1 g/dL (ref 30.0–36.0)
MCV: 87.4 fL (ref 80.0–100.0)
MCV: 86 fL (ref 80.0–100.0)
Platelets: 292 10*3/uL (ref 150–400)
Platelets: 291 10*3/uL (ref 150–400)
RBC: 3.48 MIL/uL — ABNORMAL LOW (ref 3.87–5.11)
RBC: 3.72 MIL/uL — ABNORMAL LOW (ref 3.87–5.11)
RDW: 14.5 % (ref 11.5–15.5)
RDW: 14.3 % (ref 11.5–15.5)
WBC: 8.4 10*3/uL (ref 4.0–10.5)
WBC: 8.1 10*3/uL (ref 4.0–10.5)
nRBC: 0 % (ref 0.0–0.2)
nRBC: 0 % (ref 0.0–0.2)

## 2022-01-28 LAB — RPR: RPR Ser Ql: NONREACTIVE

## 2022-01-28 MED ORDER — TERBUTALINE SULFATE 1 MG/ML IJ SOLN: 0.2500 mg | Freq: Once | INTRAMUSCULAR | Status: DC | PRN

## 2022-01-28 MED ORDER — CYCLOBENZAPRINE HCL 10 MG PO TABS
10.0000 mg | ORAL_TABLET | Freq: Three times a day (TID) | ORAL | Status: DC | PRN
  Administered 2022-01-29 (×2): 10 mg via ORAL
  Filled 2022-01-28 (×2): qty 1

## 2022-01-28 MED ORDER — PHENYLEPHRINE 80 MCG/ML (10ML) SYRINGE FOR IV PUSH (FOR BLOOD PRESSURE SUPPORT): 80.0000 ug | PREFILLED_SYRINGE | INTRAVENOUS | Status: DC | PRN

## 2022-01-28 MED ORDER — MISOPROSTOL 50MCG HALF TABLET
50.0000 ug | ORAL_TABLET | ORAL | Status: DC | PRN
  Administered 2022-01-28 (×2): 50 ug via BUCCAL
  Filled 2022-01-28 (×2): qty 1

## 2022-01-28 MED ORDER — PRENATAL MULTIVITAMIN CH
1.0000 | ORAL_TABLET | Freq: Every day | ORAL | Status: DC
  Administered 2022-01-29 – 2022-01-31 (×3): 1 via ORAL
  Filled 2022-01-28 (×3): qty 1

## 2022-01-28 MED ORDER — DIPHENHYDRAMINE HCL 50 MG/ML IJ SOLN: 12.5000 mg | INTRAMUSCULAR | Status: DC | PRN

## 2022-01-28 MED ORDER — SOD CITRATE-CITRIC ACID 500-334 MG/5ML PO SOLN: 30.0000 mL | ORAL | Status: DC | PRN

## 2022-01-28 MED ORDER — BENZOCAINE-MENTHOL 20-0.5 % EX AERO: 1.0000 | INHALATION_SPRAY | CUTANEOUS | Status: DC | PRN

## 2022-01-28 MED ORDER — LACTATED RINGERS IV SOLN: INTRAVENOUS | Status: DC

## 2022-01-28 MED ORDER — COCONUT OIL OIL: 1.0000 | TOPICAL_OIL | Status: DC | PRN

## 2022-01-28 MED ORDER — LABETALOL HCL 5 MG/ML IV SOLN: 20.0000 mg | INTRAVENOUS | Status: DC | PRN

## 2022-01-28 MED ORDER — HYDRALAZINE HCL 20 MG/ML IJ SOLN: 10.0000 mg | INTRAMUSCULAR | Status: DC | PRN

## 2022-01-28 MED ORDER — OXYTOCIN-SODIUM CHLORIDE 30-0.9 UT/500ML-% IV SOLN
2.5000 [IU]/h | INTRAVENOUS | Status: DC
  Administered 2022-01-28: 2.5 [IU]/h via INTRAVENOUS
  Filled 2022-01-28: qty 500

## 2022-01-28 MED ORDER — ZOLPIDEM TARTRATE 5 MG PO TABS: 5.0000 mg | ORAL_TABLET | Freq: Every evening | ORAL | Status: DC | PRN

## 2022-01-28 MED ORDER — LIDOCAINE-EPINEPHRINE (PF) 2 %-1:200000 IJ SOLN
INTRAMUSCULAR | Status: DC | PRN
  Administered 2022-01-28: 5 mL via EPIDURAL
  Administered 2022-01-28: 4 mL via EPIDURAL

## 2022-01-28 MED ORDER — MISOPROSTOL 200 MCG PO TABS
1000.0000 ug | ORAL_TABLET | Freq: Once | ORAL | Status: AC
  Administered 2022-01-28: 1000 ug via RECTAL

## 2022-01-28 MED ORDER — TETANUS-DIPHTH-ACELL PERTUSSIS 5-2.5-18.5 LF-MCG/0.5 IM SUSY: 0.5000 mL | PREFILLED_SYRINGE | Freq: Once | INTRAMUSCULAR | Status: DC

## 2022-01-28 MED ORDER — DIPHENHYDRAMINE HCL 25 MG PO CAPS: 25.0000 mg | ORAL_CAPSULE | Freq: Four times a day (QID) | ORAL | Status: DC | PRN

## 2022-01-28 MED ORDER — ONDANSETRON HCL 4 MG PO TABS: 4.0000 mg | ORAL_TABLET | ORAL | Status: DC | PRN

## 2022-01-28 MED ORDER — MISOPROSTOL 200 MCG PO TABS
ORAL_TABLET | ORAL | Status: AC
  Administered 2022-01-28: 1000 ug
  Filled 2022-01-28: qty 5

## 2022-01-28 MED ORDER — EPHEDRINE 5 MG/ML INJ: 10.0000 mg | INTRAVENOUS | Status: DC | PRN

## 2022-01-28 MED ORDER — FENTANYL CITRATE (PF) 100 MCG/2ML IJ SOLN
INTRAMUSCULAR | Status: AC
  Filled 2022-01-28: qty 2

## 2022-01-28 MED ORDER — LACTATED RINGERS IV SOLN
500.0000 mL | Freq: Once | INTRAVENOUS | Status: AC
  Administered 2022-01-28: 500 mL via INTRAVENOUS

## 2022-01-28 MED ORDER — FENTANYL-BUPIVACAINE-NACL 0.5-0.125-0.9 MG/250ML-% EP SOLN
12.0000 mL/h | EPIDURAL | Status: DC | PRN
  Administered 2022-01-28: 12 mL/h via EPIDURAL
  Filled 2022-01-28: qty 250

## 2022-01-28 MED ORDER — PHENYLEPHRINE 80 MCG/ML (10ML) SYRINGE FOR IV PUSH (FOR BLOOD PRESSURE SUPPORT)
80.0000 ug | PREFILLED_SYRINGE | INTRAVENOUS | Status: DC | PRN
  Filled 2022-01-28: qty 10

## 2022-01-28 MED ORDER — WITCH HAZEL-GLYCERIN EX PADS: 1.0000 | MEDICATED_PAD | CUTANEOUS | Status: DC | PRN

## 2022-01-28 MED ORDER — ONDANSETRON HCL 4 MG/2ML IJ SOLN: 4.0000 mg | INTRAMUSCULAR | Status: DC | PRN

## 2022-01-28 MED ORDER — OXYTOCIN BOLUS FROM INFUSION
333.0000 mL | Freq: Once | INTRAVENOUS | Status: AC
  Administered 2022-01-28: 333 mL via INTRAVENOUS

## 2022-01-28 MED ORDER — LIDOCAINE HCL (PF) 1 % IJ SOLN: 30.0000 mL | INTRAMUSCULAR | Status: DC | PRN

## 2022-01-28 MED ORDER — ENOXAPARIN SODIUM 150 MG/ML IJ SOSY
150.0000 mg | PREFILLED_SYRINGE | Freq: Two times a day (BID) | INTRAMUSCULAR | Status: DC
  Administered 2022-01-29 – 2022-01-31 (×5): 150 mg via SUBCUTANEOUS
  Filled 2022-01-28 (×6): qty 1

## 2022-01-28 MED ORDER — HYDRALAZINE HCL 20 MG/ML IJ SOLN: 5.0000 mg | INTRAMUSCULAR | Status: DC | PRN

## 2022-01-28 MED ORDER — BUPIVACAINE HCL (PF) 0.25 % IJ SOLN
INTRAMUSCULAR | Status: DC | PRN
  Administered 2022-01-28: 10 mL via EPIDURAL

## 2022-01-28 MED ORDER — OXYTOCIN-SODIUM CHLORIDE 30-0.9 UT/500ML-% IV SOLN
1.0000 m[IU]/min | INTRAVENOUS | Status: DC
  Administered 2022-01-28: 2 m[IU]/min via INTRAVENOUS

## 2022-01-28 MED ORDER — MAGNESIUM SULFATE 40 GM/1000ML IV SOLN
2.0000 g/h | INTRAVENOUS | Status: AC
  Administered 2022-01-28: 2 g/h via INTRAVENOUS
  Filled 2022-01-28: qty 1000

## 2022-01-28 MED ORDER — SENNOSIDES-DOCUSATE SODIUM 8.6-50 MG PO TABS
2.0000 | ORAL_TABLET | Freq: Every day | ORAL | Status: DC
  Administered 2022-01-29 – 2022-01-31 (×3): 2 via ORAL
  Filled 2022-01-28 (×3): qty 2

## 2022-01-28 MED ORDER — SIMETHICONE 80 MG PO CHEW: 80.0000 mg | CHEWABLE_TABLET | ORAL | Status: DC | PRN

## 2022-01-28 MED ORDER — FENTANYL CITRATE (PF) 100 MCG/2ML IJ SOLN: 100.0000 ug | Freq: Once | INTRAMUSCULAR | Status: DC

## 2022-01-28 MED ORDER — DIBUCAINE (PERIANAL) 1 % EX OINT: 1.0000 | TOPICAL_OINTMENT | CUTANEOUS | Status: DC | PRN

## 2022-01-28 MED ORDER — LABETALOL HCL 5 MG/ML IV SOLN: 40.0000 mg | INTRAVENOUS | Status: DC | PRN

## 2022-01-28 MED ORDER — ACETAMINOPHEN 325 MG PO TABS
650.0000 mg | ORAL_TABLET | ORAL | Status: DC | PRN
  Administered 2022-01-28 – 2022-01-29 (×2): 650 mg via ORAL
  Filled 2022-01-28 (×2): qty 2

## 2022-01-28 MED ORDER — LACTATED RINGERS IV SOLN: 500.0000 mL | INTRAVENOUS | Status: DC | PRN

## 2022-01-28 NOTE — Anesthesia Preprocedure Evaluation (Signed)
Anesthesia Evaluation    Airway Mallampati: III  TM Distance: >3 FB Neck ROM: Full    Dental no notable dental hx.    Pulmonary asthma , PE (on lovenox, last dose 01/26/22 2354)   Pulmonary exam normal breath sounds clear to auscultation       Cardiovascular hypertension (preE with severe features on mag), Pt. on medications Normal cardiovascular exam Rhythm:Regular Rate:Normal     Neuro/Psych  Headaches, PSYCHIATRIC DISORDERS Anxiety Depression    GI/Hepatic   Endo/Other  Morbid obesity (BMI 50)SLE  Renal/GU      Musculoskeletal   Abdominal   Peds  Hematology  (+) Blood dyscrasia (Hgb 9.8), anemia ,   Anesthesia Other Findings   Reproductive/Obstetrics (+) Pregnancy                             Anesthesia Physical Anesthesia Plan  ASA: 3  Anesthesia Plan: Epidural   Post-op Pain Management:    Induction:   PONV Risk Score and Plan: Treatment may vary due to age or medical condition  Airway Management Planned: Natural Airway  Additional Equipment:   Intra-op Plan:   Post-operative Plan:   Informed Consent: I have reviewed the patients History and Physical, chart, labs and discussed the procedure including the risks, benefits and alternatives for the proposed anesthesia with the patient or authorized representative who has indicated his/her understanding and acceptance.       Plan Discussed with: Anesthesiologist  Anesthesia Plan Comments: (Patient identified. Risks, benefits, options discussed with patient including but not limited to bleeding, infection, nerve damage, paralysis, failed block, incomplete pain control, headache, blood pressure changes, nausea, vomiting, reactions to medication, itching, and post partum back pain. Confirmed with bedside nurse the patient's most recent platelet count. Confirmed with the patient that they are not taking any anticoagulation, have any  bleeding history or any family history of bleeding disorders. Patient expressed understanding and wishes to proceed. All questions were answered. )        Anesthesia Quick Evaluation

## 2022-01-28 NOTE — Progress Notes (Signed)
OB Progress Note  S: Patient resting comfortably with epidural. Consents to AROM.  O: BP (!) 158/86   Pulse 88   Temp 97.9 F (36.6 C) (Oral)   Resp 16   Ht 5' 8.5" (1.74 m)   Wt (!) 150.9 kg   LMP 06/04/2021   SpO2 98%   BMI 49.84 kg/m   FHT: 130bpm, moderate variablity, + accels, - decels Toco: not graphing well SVE: 4/80/-2, sutures palpated, IUPC placed after AROM AROM: Clear, non-odorous  A/P: 36 y.o. [id number] @ [redacted]w[redacted]d admitted for  induction of labor for chronic HTN with superimposed preeclampsia (persistent headache, visual disturbances, RUQ pain.  FWB: Cat. I Labor course: S/p buccal cytotec x 1, Pitocin at 14mU/min, s/p AROM and IUPC placed now Pain: Comfortable with epidural GBS: Negative SCDs in place for DVT prophylaxis Anticipate SVD  Steva Ready, DO

## 2022-01-28 NOTE — Anesthesia Procedure Notes (Signed)
Epidural Patient location during procedure: OB Start time: 01/28/2022 11:20 AM End time: 01/28/2022 11:30 AM  Staffing Anesthesiologist: Elmer Picker, MD Performed: anesthesiologist   Preanesthetic Checklist Completed: patient identified, IV checked, risks and benefits discussed, monitors and equipment checked, pre-op evaluation and timeout performed  Epidural Patient position: sitting Prep: DuraPrep and site prepped and draped Patient monitoring: continuous pulse ox, blood pressure, heart rate and cardiac monitor Approach: midline Location: L3-L4 Injection technique: LOR air  Needle:  Needle type: Tuohy  Needle gauge: 17 G Needle length: 9 cm Needle insertion depth: 9 cm Catheter type: closed end flexible Catheter size: 19 Gauge Catheter at skin depth: 15 cm Test dose: negative  Assessment Sensory level: T8 Events: blood not aspirated, injection not painful, no injection resistance, no paresthesia and negative IV test  Additional Notes Patient identified. Risks/Benefits/Options discussed with patient including but not limited to bleeding, infection, nerve damage, paralysis, failed block, incomplete pain control, headache, blood pressure changes, nausea, vomiting, reactions to medication both or allergic, itching and postpartum back pain. Confirmed with bedside nurse the patient's most recent platelet count. Confirmed with patient that they are not currently taking any anticoagulation, have any bleeding history or any family history of bleeding disorders. Patient expressed understanding and wished to proceed. All questions were answered. Sterile technique was used throughout the entire procedure. Please see nursing notes for vital signs. Test dose was given through epidural catheter and negative prior to continuing to dose epidural or start infusion. Warning signs of high block given to the patient including shortness of breath, tingling/numbness in hands, complete motor block,  or any concerning symptoms with instructions to call for help. Patient was given instructions on fall risk and not to get out of bed. All questions and concerns addressed with instructions to call with any issues or inadequate analgesia.  Reason for block:procedure for pain

## 2022-01-28 NOTE — Progress Notes (Signed)
F & M monitor applie

## 2022-01-28 NOTE — Plan of Care (Signed)
Pt demonstrated understanding 

## 2022-01-28 NOTE — Progress Notes (Signed)
FHT Reviewed:  140bpm, moderate variability, - accel, - decel. Contractions every 2-2.5 minutes. MVUs 150-200. Pitocin at 32mU/min. Primary RN just performed cervical exam - patient is 4-4.5/80/-2.  Patient is having issues with adequate pain control with epidural - anesthesia, Dr. Armond Hang will be re-evaluating.  Steva Ready, DO

## 2022-01-28 NOTE — Progress Notes (Signed)
Redosed by anesthesia

## 2022-01-28 NOTE — Progress Notes (Signed)
Hayley Webb is a 36 y.o. [id number] at [redacted]w[redacted]d transferred from [facility] of[facility] induction of labor for pre-eclampsia with severe features of HA. MF recommended delivery after second steroid dose due to persistent HA. Pt has c/o of a HA x2 weeks. HA refractory to usual treatments, a poor response to Dilaudid, Roxicodone, and Reglan. Mild HA relief with Fioricet. Pt transferring on magnesium sulfate 2 G/hr. S/P antenatal steroids x2 doses, last dose 01/27/22 @ 1900. Hx of lupus managed on Plaquenil, pulmonary embolism 2018 managed on Lovenox 150 mg BID, last dose 01/26/22 @ 2324, asthma managed on albuterol inhaler PRN. Pt started on Labetalol 400 mg oral BID on 01/25/22.   Subjective:  Reports mild relief of HA (5/10), denies visual changes and RUQ pain. Discussed method of induction and questions answered.   Objective: Vitals:   01/27/22 2300 01/27/22 2321 01/28/22 0030 01/28/22 0102  BP:  (!) 141/78 (!) 143/82 (!) 142/85  Pulse:  77 74 77  Resp: 16  16 16   Temp:  97.8 F (36.6 C)    TempSrc:  Oral    SpO2:      Weight:      Height:        I/O last 3 completed shifts: In: 9850.9 [P.O.:5505; I.V.:4345.9] Out: 9300 [Urine:8800; Emesis/NG output:500]    FHT:  FHR: 120 bpm, variability: moderate,  accelerations:  Present,  decelerations:  Absent UC:   none SVE:   Dilation: Closed Effacement (%): 30 Exam by:: Noa Constante [name]  Labs:   Recent Labs    01/26/22 0354 01/27/22 0503  WBC 7.6 8.7  HGB 10.9* 10.3*  HCT 32.4* 30.5*  PLT 302 291    Assessment / Plan: 36 y.o. AGP@ [redacted]w[redacted]d IOL for pre-eclampsia with severe features (HA) Cat 1 GBS neg Ripening with buccal Cytotec Continue MgSO4 @ 2 G/hr Plans epidural for pain relief    [redacted]w[redacted]d MSN, [name] 01/28/2022, 1:07 AM

## 2022-01-28 NOTE — Progress Notes (Signed)
OB Progress Note  S: Patient resting comfortably but is feeling her contractions which are moderate. Desires epidural at some point. States her headache is not present currently.  O: BP 136/63   Pulse 84   Temp 98.3 F (36.8 C) (Oral)   Resp 16   Ht 5' 8.5" (1.74 m)   Wt (!) 150.9 kg   LMP 06/04/2021   SpO2 97%   BMI 49.84 kg/m  Gen: NAD, pleasant Cardio: RRR Lungs: CTAB, no wheezes/rales/rhonchi Abd: Soft, gravid, non-tender Ext: Trace bilateral LE edema, no bilateral calf tenderness  FHT: 125bpm, moderate variablity, + accels, - decels Toco: not graphing well SVE: 3/80/ballotable, sutures palpated  UOP: 100-300cc/hour  A/P: 36 y.o. [id number] @ [redacted]w[redacted]d admitted for induction of labor for chronic HTN with superimposed preeclampsia (persistent headache, visual disturbances, RUQ pain.  FWB: Cat. I Labor course: S/p buccal cytotec x 1 - will titrate pitocin and AROM when fetal head is engaged Pain: Per patient request GBS: Negative SCDs ordered for DVT prophylaxis - discussed with primary RN Anticipate SVD  Steva Ready, DO

## 2022-01-28 NOTE — Lactation Note (Signed)
This note was copied from a baby'Webb chart.  NICU Lactation Consultation Note  Patient Name: Hayley Webb [id number] Date: 01/28/2022 Age:34 hours  Subjective Reason for consult: Initial assessment; NICU baby; Preterm <34wks; Infant < 6lbs  Visited with mom of 0 hours old pre-term NICU female, she'Webb a P3 and has some experience breastfeeding. She'll be going to [facility] care but doesn't have an RN assignment yet. Spoke to charge RN in [facility] [place] and asked her to set mom up with a pump once she'Webb ready to start pumping, since this is the end of LC shift and Ms. Dulak is still in L&D. Reviewed pumping schedule, lactogenesis II, benefits of premature milk for NICU infants and anticipatory guidelines.  Objective Infant data: Mother'Webb Current Feeding Choice: Breast Milk  Maternal data: [id number]  Vaginal, Spontaneous Significant Breast History:: (+) breast changes Current breast feeding challenges:: NICU admission Does the patient have breastfeeding experience prior to this delivery?: Yes How long did the patient breastfeed?: # 1 for 2 months and baby # 2 for 3 months Risk factor for low milk supply:: prematurity, infant separation WIC Program: Yes WIC Referral Sent?: Yes  Assessment Infant: In NICU  Maternal: In L& D, breast are soft, just a bit of sensitivity  Intervention/Plan Interventions: Breast feeding basics reviewed; Education  Plan of care: Encouraged mom to start pumping as soon as she feels able to, once pumping is initiated it'Webb recommended to follow a 3 hours schedule NICU LC to F/U with mom on Monday 07/010/23 since we don't have LC NICU coverage tomorrow  Female visitor present and supportive, FOB was with baby in the NICU. All questions and concerns answered, family to contact [facility] services PRN.  Consult Status: NICU follow-up NICU Follow-up type: Maternal D/C visit; Verify onset of copious milk; Verify absence of engorgement   Hayley Webb  Hayley Webb 01/28/2022, 6:41 PM

## 2022-01-28 NOTE — Progress Notes (Signed)
Monitors removed.

## 2022-01-29 ENCOUNTER — Encounter (HOSPITAL_COMMUNITY): Payer: Self-pay | Admitting: Obstetrics and Gynecology

## 2022-01-29 LAB — CBC
HCT: 32 % — ABNORMAL LOW (ref 36.0–46.0)
Hemoglobin: 10.5 g/dL — ABNORMAL LOW (ref 12.0–15.0)
MCH: 28.9 pg (ref 26.0–34.0)
MCHC: 32.8 g/dL (ref 30.0–36.0)
MCV: 88.2 fL (ref 80.0–100.0)
Platelets: 308 10*3/uL (ref 150–400)
RBC: 3.63 MIL/uL — ABNORMAL LOW (ref 3.87–5.11)
RDW: 14.3 % (ref 11.5–15.5)
WBC: 8 10*3/uL (ref 4.0–10.5)
nRBC: 0 % (ref 0.0–0.2)

## 2022-01-29 MED ORDER — OXYCODONE HCL 5 MG PO TABS
5.0000 mg | ORAL_TABLET | ORAL | Status: DC | PRN
  Administered 2022-01-29: 5 mg via ORAL
  Filled 2022-01-29: qty 1

## 2022-01-29 MED ORDER — GABAPENTIN 300 MG PO CAPS
300.0000 mg | ORAL_CAPSULE | Freq: Two times a day (BID) | ORAL | Status: DC | PRN
  Administered 2022-01-29: 300 mg via ORAL
  Filled 2022-01-29: qty 1

## 2022-01-29 MED ORDER — ACETAMINOPHEN 325 MG PO TABS: 650.0000 mg | ORAL_TABLET | Freq: Four times a day (QID) | ORAL | Status: DC | PRN

## 2022-01-29 MED ORDER — OXYCODONE HCL 5 MG PO TABS
10.0000 mg | ORAL_TABLET | ORAL | Status: DC | PRN
  Administered 2022-01-29 – 2022-01-31 (×6): 10 mg via ORAL
  Filled 2022-01-29 (×6): qty 2

## 2022-01-29 NOTE — Progress Notes (Signed)
ANTICOAGULATION CONSULT NOTE - Follow Up Consult  Pharmacy Consult for lovenox Indication: hx of recurrent PEs  Allergies  Allergen Reactions   Bactrim Anaphylaxis and Hives   Hydrocodone-Acetaminophen Anaphylaxis   Nifedipine Swelling and Anaphylaxis    Swelling of tongue   Pineapple Itching   Prednisone Anaphylaxis and Hives    Has been on Dexamethasone without issue.   Shellfish Allergy Itching   Sulfamethoxazole-Trimethoprim Other (See Comments)    Complications due to lupus   Nsaids Other (See Comments)    Has Lupus, reccommended to avoid NSAIDs   Other Itching    Shrimp:throat itching "can eat other shellfish"   Vicodin [Hydrocodone-Acetaminophen] Hives and Swelling    Can take Percocet and Tylenol without reaction.    Patient Measurements: Height: 5' 8.5" (174 cm) Weight: (!) 150.9 kg (332 lb 9.6 oz) IBW/kg (Calculated) : 65.05  Vital Signs: Temp: 97.9 F (36.6 C) (07/09 0421) Temp Source: Oral (07/09 0421) BP: 136/74 (07/09 0421) Pulse Rate: 82 (07/09 0421)  Labs: Recent Labs    01/27/22 0503 01/28/22 0753 01/28/22 1921 01/29/22 0416  HGB 10.3* 9.8* 10.6* 10.5*  HCT 30.5* 30.4* 32.0* 32.0*  PLT 291 292 291 308  CREATININE 0.68  --   --   --     Estimated Creatinine Clearance: 157 mL/min (by C-G formula based on SCr of 0.68 mg/dL).   Medications:   Assessment: Pt with hx of PEs x 3, on lovenox 150 mg BID during pregnancy. Held since 7/7 for IOL d/t PreE w/ severe features. Delivered baby on 7/8.  Epidural placement: 1120 on 7/8, removal 0829 on 7/9  Goal of Therapy:  Anti-Xa level 0.6-1 units/ml 4hrs after LMWH dose given Monitor platelets by anticoagulation protocol: Yes   Plan:  Restart lovenox 150 mg SQ Q 12 hrs, first dose at 1230 (4 hrs after epidural removal) Monitor CBC  Bayard Hugger, PharmD, BCPS, BCPPS Clinical Pharmacist  Pager: [phone]  01/29/2022,9:01 AM

## 2022-01-29 NOTE — Anesthesia Postprocedure Evaluation (Signed)
Anesthesia Post Note  Patient: Hayley Webb  Procedure(s) Performed: AN AD HOC LABOR EPIDURAL     Patient location during evaluation: Mother Baby Anesthesia Type: Epidural Level of consciousness: awake and alert and oriented Pain management: satisfactory to patient Vital Signs Assessment: post-procedure vital signs reviewed and stable Respiratory status: respiratory function stable Cardiovascular status: stable Postop Assessment: no headache, no backache, epidural receding, patient able to bend at knees, no signs of nausea or vomiting, adequate PO intake and able to ambulate Anesthetic complications: no   No notable events documented.  Last Vitals:  Vitals:   01/29/22 0510 01/29/22 0612  BP:    Pulse:    Resp: 18 18  Temp:    SpO2:      Last Pain:  Vitals:   01/29/22 0955  TempSrc:   PainSc: 3    Pain Goal: Patients Stated Pain Goal: 3 (01/25/22 1700)                 Karleen Dolphin

## 2022-01-29 NOTE — Progress Notes (Signed)
Postpartum Note Day #1  S:  Patient doing well.  Pain controlled.  Tolerating regular diet.   Ambulating and voiding without difficulty. Reports low back pain at the epidural site which is not relieved by Flexeril and Gabapentin. Although Vicodin is listed as an anaphylactic allergy - states she has used Roxi in the past without issue. Due to epidural catheter having just been removed this morning, Lovenox will start at 1230 today. Denies fevers, chills, chest pain, SOB, N/V, or worsening bilateral LE edema.  Lochia: Minimal Infant feeding:  Breast Circumcision:  Desires, infant in NICU Contraception:  Desires interval bilateral tubal sterilization  O: Temp:  [97.9 F (36.6 C)-98.6 F (37 C)] 97.9 F (36.6 C) (07/09 0421) Pulse Rate:  [75-114] 82 (07/09 0421) Resp:  [15-20] 18 (07/09 0612) BP: (102-158)/(34-109) 136/74 (07/09 0421) SpO2:  [93 %-99 %] 97 % (07/09 0421) Gen: NAD, pleasant and cooperative CV: RRR Resp: CTAB, no wheezes/rales/rhonchi Abdomen: soft, non-distended, non-tender throughout Uterus: firm, non-tender, below umbilicus MSK: Epidural site with dressing in place - no surrounding erythema or distention Ext: Trace bilateral LE edema, no bilateral calf tenderness, SCDs placed on  UOP: 350cc/hour  Labs:  Recent Labs    01/28/22 1921 01/29/22 0416  HGB 10.6* 10.5*  HCT 32.0* 32.0*    A/P: Patient is a 36 y.o. [id number] PPD#1 s/p SVD.  S/p SVD - Pain well controlled  - GU: UOP is adequate - GI: Tolerating regular diet - Activity: encouraged sitting up to chair and ambulation as tolerated - DVT Prophylaxis: SCDs in bed, Therapeutic Lovenox re-ordered for today - Labs: stable as above  Chronic HTN with superimposed preeclampsia  - Preeclampsia symptoms included persistent headache, visual changes, and occasional RUQ pain - Medication: Labetalol 400mg  BID (ordered) - Magnesium sulfate x 24 hours for seizure prophylaxis  History of APS with PE x 3 (2018) -  Home medication: Lovenox 150mg  BID ordered to resume today - SCDs for DVT prophylaxis  Lupus - Medication: Plaquenil 200mg  daily (ordered)  - Blood pressures normotensive postpartum - Fioricet and Reglan ordered for headache PRN  Disposition:  D/C home tomorrow.  08-21-1976, DO [phone] (office)

## 2022-01-30 ENCOUNTER — Encounter (HOSPITAL_COMMUNITY): Payer: Self-pay | Admitting: Obstetrics and Gynecology

## 2022-01-30 LAB — COMPREHENSIVE METABOLIC PANEL
ALT: 22 U/L (ref 0–44)
AST: 20 U/L (ref 15–41)
Albumin: 2.4 g/dL — ABNORMAL LOW (ref 3.5–5.0)
Alkaline Phosphatase: 63 U/L (ref 38–126)
Anion gap: 9 (ref 5–15)
BUN: 9 mg/dL (ref 6–20)
CO2: 25 mmol/L (ref 22–32)
Calcium: 8.6 mg/dL — ABNORMAL LOW (ref 8.9–10.3)
Chloride: 106 mmol/L (ref 98–111)
Creatinine, Ser: 0.74 mg/dL (ref 0.44–1.00)
GFR, Estimated: >60 mL/min (ref >60)
Glucose, Bld: 123 mg/dL — ABNORMAL HIGH (ref 70–99)
Potassium: 3.3 mmol/L — ABNORMAL LOW (ref 3.5–5.1)
Sodium: 140 mmol/L (ref 135–145)
Total Bilirubin: 0.2 mg/dL — ABNORMAL LOW (ref 0.3–1.2)
Total Protein: 5.5 g/dL — ABNORMAL LOW (ref 6.5–8.1)

## 2022-01-30 LAB — CBC
HCT: 27.3 % — ABNORMAL LOW (ref 36.0–46.0)
Hemoglobin: 8.9 g/dL — ABNORMAL LOW (ref 12.0–15.0)
MCH: 28.3 pg (ref 26.0–34.0)
MCHC: 32.6 g/dL (ref 30.0–36.0)
MCV: 86.7 fL (ref 80.0–100.0)
Platelets: 263 10*3/uL (ref 150–400)
RBC: 3.15 MIL/uL — ABNORMAL LOW (ref 3.87–5.11)
RDW: 14.4 % (ref 11.5–15.5)
WBC: 6.2 10*3/uL (ref 4.0–10.5)
nRBC: 0 % (ref 0.0–0.2)

## 2022-01-30 MED ORDER — KETOROLAC TROMETHAMINE 30 MG/ML IJ SOLN: 30.0000 mg | Freq: Once | INTRAMUSCULAR | Status: DC

## 2022-01-30 MED ORDER — FUROSEMIDE 10 MG/ML IJ SOLN
20.0000 mg | Freq: Once | INTRAMUSCULAR | Status: AC
  Administered 2022-01-30: 20 mg via INTRAVENOUS
  Filled 2022-01-30: qty 2

## 2022-01-30 MED ORDER — IBUPROFEN 600 MG PO TABS
600.0000 mg | ORAL_TABLET | Freq: Four times a day (QID) | ORAL | Status: DC | PRN
Start: 2022-01-30 — End: 2022-01-31
  Administered 2022-01-30 – 2022-01-31 (×2): 600 mg via ORAL
  Filled 2022-01-30 (×2): qty 1

## 2022-01-30 MED ORDER — BUTALBITAL-APAP-CAFFEINE 50-325-40 MG PO TABS
2.0000 | ORAL_TABLET | ORAL | Status: DC | PRN
Start: 2022-01-30 — End: 2022-01-30

## 2022-01-30 MED ORDER — PROMETHAZINE HCL 25 MG PO TABS
25.0000 mg | ORAL_TABLET | Freq: Four times a day (QID) | ORAL | Status: DC | PRN
  Administered 2022-01-30 (×2): 25 mg via ORAL
  Filled 2022-01-30 (×2): qty 1

## 2022-01-30 MED ORDER — KETOROLAC TROMETHAMINE 30 MG/ML IJ SOLN
30.0000 mg | Freq: Once | INTRAMUSCULAR | Status: AC
Start: 2022-01-30 — End: 2022-01-30
  Administered 2022-01-30: 30 mg via INTRAVENOUS
  Filled 2022-01-30: qty 1

## 2022-01-30 NOTE — Lactation Note (Signed)
This note was copied from a baby's chart.  NICU Lactation Consultation Note  Patient Name: Boy Cythnia Osmun WLSLH'T Date: 01/30/2022 Age:34 hours  Subjective Reason for consult: Follow-up assessment; NICU baby; Preterm <34wks; Infant < 6lbs  Visited with mom of 22 hours old pre-term NICU female, she reported that she has been pumping consistently but also that her supply has decreased in comparison to what she had when she first had baby "MJ"; she got 10 ml on her first pumping sessions. Revised expectation and anticipatory guidelines, let her know that the purpose of pumping this early on is mainly for breast stimulation instead of getting volume, she voiced understanding.  Objective Infant data: Mother's Current Feeding Choice: Breast Milk and Donor Milk  Infant feeding assessment Scale for Readiness: 3  Maternal data: [id number]  Vaginal, Spontaneous Pumping frequency: 6 times/24 hours Pumped volume: 0 mL (drops) WIC Program: Yes WIC Referral Sent?: Yes  Assessment Infant: In NICU  Maternal: Milk volume: Normal   Intervention/Plan Interventions: Breast feeding basics reviewed; Coconut oil; DEBP; Hand pump; Education; "The NICU and Your Baby" book; LC Services brochure  Tools: Pump; Coconut oil Pump Education: Setup, frequency, and cleaning  Plan of care: Encouraged mom to continue pumping consistently every 3 hours, at least 8 pumping sessions/24 hours. Bilateral pumping with the DEBP was strongly encouraged. Breast massage, hand expression and coconut oil were also encouraged prior pumping. OB Specialty care RN Marchelle Folks will get coconut oil once mom gets back to her room, this consult took place in the NICU   No other support person at this time. All questions and concerns answered, family to contact [facility] services PRN.  Consult Status: NICU follow-up NICU Follow-up type: Maternal D/C visit; Verify onset of copious milk; Verify absence of engorgement   Damarys Speir S  Brayli Klingbeil 01/30/2022, 11:41 AM

## 2022-01-30 NOTE — Progress Notes (Signed)
Post Partum Day 2 Subjective: Patient reports having her headache return and some blurry vision.  No RUQ pain.   Objective: Blood pressure 137/76, pulse 78, temperature 97.8 F (36.6 C), temperature source Oral, resp. rate 18, height 5' 8.5" (1.74 m), weight (!) 150.9 kg, last menstrual period 06/04/2021, SpO2 98 %, unknown if currently breastfeeding.  Physical Exam:  General: alert, cooperative, and no distress Lochia: appropriate Uterine Fundus: firm Incision: n/a DVT Evaluation: No evidence of DVT seen on physical exam. Negative Homan's sign. No cords or calf tenderness.  Recent Labs    01/28/22 1921 01/29/22 0416  HGB 10.6* 10.5*  HCT 32.0* 32.0*    Assessment/Plan: PPD#2 s/p SVD at 32 weeks h/o chronic HTN with superimposed pre E Blood pressures are normal, however patient will recurrence of HA.  Will get meds for migraine to see if HA resolves and re-check Pre E labs (CBC, CMP) Discussed with patient the possibility of having to re-start magnesium sulfate IV for seizure  Prophylaxis.     LOS: 5 days   Essie Hart, MD 01/30/2022, 3:05 PM

## 2022-01-30 NOTE — Progress Notes (Signed)
Patient screened out for psychosocial assessment since none of the following apply:  Psychosocial stressors documented in mother or baby's chart  Gestation less than 32 weeks  Code at delivery   Infant with anomalies Please contact the Clinical Social Worker if specific needs arise, by MOB's request, or if MOB scores greater than 9/yes to question 10 on Edinburgh Postpartum Depression Screen.  Eyan Hagood, LCSW Clinical Social Worker [facility] Cell#: [phone]     

## 2022-01-30 NOTE — Lactation Note (Signed)
This note was copied from a baby's chart. Lactation Consultation Note  Patient Name: Hayley Webb [id number] Date: 01/30/2022 Reason for consult: Follow-up assessment;NICU baby;Preterm <34wks;Infant < 6lbs Age:34 hours  OB Specialty care RN Marchelle Folks called this LC and reported she wasn't able to get coconut oil for this Hayley Webb due to being shortages/being out of stock. Provided Hayley Webb with a sample of Motherlove nipple cream to use prior pumping. She also voiced that her mom will be getting some coconut oil for her, advised her to get it from the grocery aisle and not the pharmacy aisle (food grade instead of skincare grade). She reports that pumping is going well and that continues getting drops on each pumping session, praised her for her efforts. All questions and concerns answered, LC to follow up with mom tomorrow.  Feeding Mother's Current Feeding Choice: Breast Milk and Donor Milk  Lactation Tools Discussed/Used Tools: Pump;Coconut oil Breast pump type: Double-Electric Breast Pump;Manual (Mom used the hand pump on her last pumping session because she wasn't getting as much volume as she did when she first had the baby) Pump Education: Setup, frequency, and cleaning Reason for Pumping: pre-term in NICU Pumping frequency: 6 times/24 hours Pumped volume:  (drops)  Interventions Interventions: Breast feeding basics reviewed;Coconut oil;DEBP;Hand pump;Education;"The NICU and Your Baby" book;LC Services brochure  Consult Status Consult Status: NICU follow-up Date: 01/30/22 Follow-up type: In-patient   Nuriya Stuck Venetia Constable 01/30/2022, 3:11 PM

## 2022-01-31 ENCOUNTER — Ambulatory Visit: Payer: Self-pay

## 2022-01-31 ENCOUNTER — Other Ambulatory Visit (HOSPITAL_COMMUNITY): Payer: Self-pay

## 2022-01-31 ENCOUNTER — Inpatient Hospital Stay (HOSPITAL_COMMUNITY): Payer: Medicaid Other

## 2022-01-31 DIAGNOSIS — R519 Headache, unspecified: Secondary | ICD-10-CM | POA: Diagnosis not present

## 2022-01-31 LAB — SURGICAL PATHOLOGY

## 2022-01-31 MED ORDER — CYCLOBENZAPRINE HCL 10 MG PO TABS
10.0000 mg | ORAL_TABLET | Freq: Three times a day (TID) | ORAL | 0 refills | Status: DC | PRN
  Filled 2022-01-31: qty 30, 10d supply, fill #0

## 2022-01-31 MED ORDER — LABETALOL HCL 200 MG PO TABS
400.0000 mg | ORAL_TABLET | Freq: Two times a day (BID) | ORAL | 3 refills | Status: DC
  Filled 2022-01-31: qty 30, 8d supply, fill #0

## 2022-01-31 MED ORDER — ACETAMINOPHEN 325 MG PO TABS
650.0000 mg | ORAL_TABLET | Freq: Four times a day (QID) | ORAL | 3 refills | Status: DC | PRN
  Filled 2022-01-31: qty 30, 4d supply, fill #0

## 2022-01-31 MED ORDER — IBUPROFEN 600 MG PO TABS
600.0000 mg | ORAL_TABLET | Freq: Four times a day (QID) | ORAL | 3 refills | Status: DC | PRN
  Filled 2022-01-31: qty 60, 15d supply, fill #0

## 2022-01-31 NOTE — Discharge Summary (Signed)
[facility] [place] Discharge Summary   Patient Name:   Hayley Webb DOB:     November 12, 1985 MRN:     [id number]  Date of Admission:   01/25/2022 Date of Discharge:  01/31/2022  Admitting diagnosis:    Preeclampsia [O14.90] Principal Problem:   Preeclampsia    Discharge diagnosis:    Preeclampsia [O14.90] Principal Problem:   Preeclampsia  Preterm Pregnancy Delivered and CHTN with superimposed preeclampsia        Additional problems: maternal obesity affecting pregnancy; SLE; h/o VTE                                          Post partum procedures: none Augmentation: AROM, Pitocin, and Cytotec Complications: None  Hospital course: Induction of Labor With Vaginal Delivery   36 y.o. yo [phone] at 37w4dwas admitted to the hospital 01/25/2022 for induction of labor.  Indication for induction: Preeclampsia.  Patient had an uncomplicated labor course as follows: Membrane Rupture Time/Date: 12:00 PM ,01/28/2022   Delivery Method:Vaginal, Spontaneous  Episiotomy: None  Lacerations:  None  Details of delivery can be found in separate delivery note.  Patient had a routine postpartum course. Patient is discharged home 01/31/22.  Newborn Data: Birth date:01/28/2022  Birth time:5:50 PM  Gender:Female  Living status:Living  Apgars:9 ,9  Weight:1950 g   Magnesium Sulfate received: Yes: Seizure prophylaxis BMZ received: Yes Rhophylac:N/A MMR:No T-DaP:Given prenatally Flu: No Transfusion:No                                                               Type of Delivery:  nsvd Delivering Provider: DDrema Dallas Date of Delivery:  01/28/22  Newborn Data:  Baby Feeding:   Bottle and Breast Disposition:   NICU  Physical Exam:   Vitals:   01/31/22 0016 01/31/22 0405 01/31/22 0727 01/31/22 1400  BP: 123/81 118/64 (!) 150/83 125/77  Pulse: 75 81 75 74  Resp: 16 16 18 20   Temp: 98 F (36.7 C) 97.9 F (36.6 C) 97.7 F (36.5 C) 97.8 F (36.6 C)  TempSrc: Oral Oral Oral Oral   SpO2: 98% 98% 98% 100%  Weight:      Height:       General: alert, cooperative, and no distress Lochia: appropriate Uterine Fundus: firm Incision: N/A DVT Evaluation: No evidence of DVT seen on physical exam. Negative Homan's sign. No cords or calf tenderness.  Labs: Lab Results  Component Value Date   WBC 6.2 01/30/2022   HGB 8.9 (L) 01/30/2022   HCT 27.3 (L) 01/30/2022   MCV 86.7 01/30/2022   PLT 263 01/30/2022      Latest Ref Rng & Units 01/30/2022    3:14 PM  CMP  Glucose 70 - 99 mg/dL 123   BUN 6 - 20 mg/dL 9   Creatinine 0.44 - 1.00 mg/dL 0.74   Sodium 135 - 145 mmol/L 140   Potassium 3.5 - 5.1 mmol/L 3.3   Chloride 98 - 111 mmol/L 106   CO2 22 - 32 mmol/L 25   Calcium 8.9 - 10.3 mg/dL 8.6   Total Protein 6.5 - 8.1 g/dL 5.5   Total Bilirubin 0.3 - 1.2 mg/dL 0.2  Alkaline Phos 38 - 126 U/L 63   AST 15 - 41 U/L 20   ALT 0 - 44 U/L 22     Discharge instruction: per After Visit Summary and "Baby and Me Booklet".  After Visit Meds:  Allergies as of 01/31/2022       Reactions   Bactrim Anaphylaxis, Hives   Hydrocodone-acetaminophen Anaphylaxis   Nifedipine Swelling, Anaphylaxis   Swelling of tongue   Pineapple Itching   Prednisone Anaphylaxis, Hives   Has been on Dexamethasone without issue.   Shellfish Allergy Itching   Sulfamethoxazole-trimethoprim Other (See Comments)   Complications due to lupus   Other Itching   Shrimp:throat itching "can eat other shellfish"   Vicodin [hydrocodone-acetaminophen] Hives, Swelling   Can take Percocet and Tylenol without reaction.        Medication List     TAKE these medications    acetaminophen 325 MG tablet Commonly known as: TYLENOL Take 2 tablets (650 mg total) by mouth every 6 (six) hours as needed for headache or mild pain (for pain scale < 4  OR  temperature  >/=  100.5 F).   albuterol (2.5 MG/3ML) 0.083% nebulizer solution Commonly known as: PROVENTIL Take 3 mLs (2.5 mg total) by nebulization  every 6 (six) hours as needed for wheezing or shortness of breath.   albuterol 108 (90 Base) MCG/ACT inhaler Commonly known as: VENTOLIN HFA Inhale 2 puffs into the lungs every 6 (six) hours as needed for wheezing or shortness of breath.   cholecalciferol 25 MCG (1000 UNIT) tablet Commonly known as: VITAMIN D3 Take 2,000 Units by mouth daily.   cyclobenzaprine 10 MG tablet Commonly known as: FLEXERIL Take 1 tablet (10 mg total) by mouth 3 (three) times daily as needed for muscle spasms (cramping).   enoxaparin 150 MG/ML injection Commonly known as: LOVENOX Inject 150 mg into the skin every 12 (twelve) hours.   EPINEPHrine 0.1 MG/0.1ML Soaj Inject 1 Dose as directed as needed (for anaphylaxis).   hydroxychloroquine 200 MG tablet Commonly known as: PLAQUENIL Take by mouth daily.   ibuprofen 600 MG tablet Commonly known as: ADVIL Take 1 tablet (600 mg total) by mouth every 6 (six) hours as needed for headache or cramping.   labetalol 200 MG tablet Commonly known as: NORMODYNE Take 2 tablets (400 mg total) by mouth 2 (two) times daily. What changed: medication strength   multivitamin capsule Take 1 capsule by mouth daily.        Diet: low salt diet  Activity: Advance as tolerated. Pelvic rest for 6 weeks.   Outpatient follow up:1 week Follow up Appt:No future appointments. Follow up visit: No follow-ups on file.  Postpartum contraception: Not Discussed  01/31/2022 Sanjuana Kava, MD

## 2022-01-31 NOTE — Progress Notes (Signed)
Patient discharged to home with husband.  Condition stable.  Patient ambulated to car with RN.  No equipment for home ordered at discharge.

## 2022-01-31 NOTE — Progress Notes (Signed)
Pt reports complete resolution of headache and blurred vision. Informed pt of CT of head. Pt desires discharge in AM. [name] to discuss plan with Dr. Normand Sloop.   Rhea Pink, DNP, [name] 01/31/2022 1:25 AM

## 2022-01-31 NOTE — Lactation Note (Signed)
This note was copied from a baby's chart.  NICU Lactation Consultation Note  Patient Name: Hayley Webb [id number] Date: 01/31/2022 Age:34 hours  Subjective Reason for consult: Follow-up assessment; NICU baby; Maternal discharge; Preterm <34wks; Infant < 6lbs  Visited with mom of 23 hours old pre-term NICU female, she's a P3 and going home today. Reviewed discharge education, lactogenesis II, pumping schedule and anticipatory guidelines. [name] was invited to stay in baby's room for as long as she wishes to, [facility] referral was faxed to the [facility] office on admission; options for breast pumps were also discussed.  Objective Infant data: Mother's Current Feeding Choice: Breast Milk and Donor Milk Infant feeding assessment Scale for Readiness: 3  Maternal data: [id number]  Vaginal, Spontaneous Pumping frequency: 4-5 times/24 hours Pumped volume: 0 mL (4-5 drops) WIC Program: Yes WIC Referral Sent?: Yes  Assessment Infant: In NICU  Maternal: Milk volume: Normal  Intervention/Plan Interventions: Breast feeding basics reviewed; Coconut oil; Education; DEBP  Tools: Pump; Coconut oil Pump Education: Setup, frequency, and cleaning; Milk Storage  Plan of care: Encouraged mom to continue pumping consistently every 3 hours, at least 8 pumping sessions/24 hours. Bilateral pumping with the DEBP was strongly encouraged. She'll take all pump parts to baby's room after her discharge   FOB present. All questions and concerns answered, family to contact [facility] services PRN.  Consult Status: NICU follow-up NICU Follow-up type: Verify onset of copious milk; Verify absence of engorgement; Weekly NICU follow up    [employer] 01/31/2022, 5:14 PM

## 2022-02-01 ENCOUNTER — Ambulatory Visit: Payer: Medicaid Other

## 2022-02-03 ENCOUNTER — Ambulatory Visit: Payer: Self-pay

## 2022-02-03 NOTE — Lactation Note (Signed)
This note was copied from a baby's chart. Lactation Consultation Note  Patient Name: Hayley Webb Date: 02/03/2022   Age:34 days  Lactation attempted to follow up with Ms. Taniguchi on 7/13 and 7/14. Lactation will attempt again on 7/15 or 7/16.   Walker Shadow 02/03/2022, 5:12 PM

## 2022-02-06 ENCOUNTER — Telehealth (HOSPITAL_COMMUNITY): Payer: Self-pay | Admitting: *Deleted

## 2022-02-06 ENCOUNTER — Ambulatory Visit: Payer: Self-pay

## 2022-02-06 NOTE — Lactation Note (Signed)
This note was copied from a baby's chart.  NICU Lactation Consultation Note  Patient Name: Boy [name] [id number] Date: 02/06/2022 Age:36 years   Subjective Reason for consult: Follow-up assessment; NICU baby; Preterm <34wks  Ms. Baggerly called lactation and asked for breast pump tubing and cup replacements. They were missing from her room this morning; they were present prior to her leaving a 0600 today.  I brought her new supplies and an XL band to make a pumping top. I observed her pump and noted accurate fitting of flanges.  I recommended that she pump q3 hours.  Ms. Gregg has begun to practice lick and learn with baby. She would like lactation to follow up at 1400 on 7/18.   Objective Infant data: Mother's Current Feeding Choice: Breast Milk  Infant feeding assessment Scale for Readiness: 3  Maternal data: [id number]  Vaginal, Spontaneous Current breast feeding challenges:: NICU  Pumping frequency: 6-8 times/day - q3-4 hours Pumped volume: 60 mL  WIC Program: Yes WIC Referral Sent?: Yes Pump: Personal (Motif)  Assessment Maternal: Milk volume: Normal  Intervention/Plan Interventions: Education; Breast feeding basics reviewed; DEBP  Tools: Pump; Hands-free pumping top Pump Education: Setup, frequency, and cleaning  Plan: Consult Status: NICU follow-up  NICU Follow-up type: Weekly NICU follow up    Walker Shadow 02/06/2022, 3:09 PM

## 2022-02-06 NOTE — Telephone Encounter (Signed)
Received phone message from patient. RN attempted return call. This RN left message for patient to call if she has any questions or concerns. Deforest Hoyles, RN, 02/06/22, Windy Fast

## 2022-02-06 NOTE — Telephone Encounter (Signed)
Attempted to complete hospital discharge follow-up call. No answer received. Voicemail full. Deforest Hoyles, RN, 02/06/22, [phone]

## 2022-02-07 ENCOUNTER — Ambulatory Visit: Payer: Self-pay

## 2022-02-07 NOTE — Lactation Note (Signed)
This note was copied from a baby's chart.  NICU Lactation Consultation Note  Patient Name: Hayley Webb [id number] Date: 02/07/2022 Age:36 years   Subjective Reason for consult: Breastfeeding assistance Mother continues to pump frequently. She pre-pumped before this feeding time. Infant latched with 19mm shield. Mother felt tugging without pinch. I heard a few audible swallows and there was copious milk in shield p bf'ing.   I spoke with Anise Salvo about SLP f/u. She will plan to observe 1100 feeding on 7/19.  I reviewed IDF and feeding expectations with mother.    Objective Infant data: Mother's Current Feeding Choice: Breast Milk  10-minute feeding with a few audible swallows RR briefly as high as 100 and briefly dropped to 19 with O2 down to 84%. Feeding discontinued.  Infant feeding assessment Scale for Readiness: 3    Maternal data: [id number]  Vaginal, Spontaneous  Current breast feeding challenges:: NICU  Pumping frequency: q3h Pumped volume: 75 mL   WIC Program: Yes WIC Referral Sent?: Yes Pump: Personal (Motif)  Assessment Infant: LATCH Score: 8  Infant initially self paced but likely tired by end of feeding causing disorganization and drop in RR/O2.   Maternal: Milk volume: Normal   Intervention/Plan Interventions: Infant Driven Feeding Algorithm education; Education  Tools: Pump; Hands-free pumping top Pump Education: Setup, frequency, and cleaning  Plan: Consult Status: NICU follow-up  NICU Follow-up type: Assist with IDF-1 (Mother to pre-pump before breastfeeding); Weekly NICU follow up; Assist with IDF-2 (Mother does not need to pre-pump before breastfeeding)  SLP to f/u and advise on continued pre-pumping need.   Elder Negus 02/07/2022, 2:22 PM

## 2022-02-08 ENCOUNTER — Ambulatory Visit: Payer: Medicaid Other

## 2022-02-10 ENCOUNTER — Ambulatory Visit: Payer: Self-pay

## 2022-02-10 NOTE — Lactation Note (Signed)
This note was copied from a baby's chart.  NICU Lactation Consultation Note  Patient Name: Hayley Webb JLLVD'I Date: 02/10/2022 Age:34 days  Subjective Reason for consult: NICU baby; Breastfeeding assistance; Late-preterm 34-36.6wks; Infant < 6lbs; Follow-up assessment  Visited with mom of 49 99/12 weeks old (adjusted) NICU female, she's a P3 and requested a feeding assist for the 11 am feeding. Baby "MJ" was awake but drowsy upon my arrival, assisted with latching with and without using  the NS # 20; baby had a much better grip and able to sustain the latch for with NS # 20 with a net time of 4 minutes which required a full gavage feeding (see LATCH score of 7 in flowsheets). No adverse events to report on this feeding. Reviewed feeding cues, LPI behavior, IDF 1/2, feeding expectations at this age and strategies to increase supply.  Objective Infant data: Mother's Current Feeding Choice: Breast Milk  Infant feeding assessment Scale for Readiness: 2  Maternal data: [id number]  Vaginal, Spontaneous Pumping frequency: 6 times/24 hours Pumped volume: 60 mL (60-80 ml) WIC Program: Yes WIC Referral Sent?: Yes Pump: Personal (Motif)  Assessment Infant: LATCH Score: 7  Maternal: Milk volume: Low  Intervention/Plan Interventions: Breast feeding basics reviewed; Breast massage; Hand express; Breast compression; Support pillows; Adjust position; Education; Infant Driven Feeding Algorithm education Tools: Pump Pump Education: Setup, frequency, and cleaning; Milk Storage  Plan of care: Encouraged mom to continue pumping consistently every 3 hours, the closer she can get to the 8 pumping sessions/24 hours. Bilateral pumping with the DEBP was strongly encouraged. She'll start power pumping in the AM Mom will start doing some pre-feeding activities with baby such as paci dips, finger feeding and holding during gavage feedings She'll continue taking baby to breast on feeding cues around  feeding times, will try STS on next feedings and continue using NS # 20 PRN   Big brother present. All questions and concerns answered, family to contact [facility] services PRN.  Consult Status: NICU follow-up NICU Follow-up type: Assist with IDF-2 (Mother does not need to pre-pump before breastfeeding)   Emmeline Winebarger Venetia Constable 02/10/2022, 3:23 PM

## 2022-02-14 ENCOUNTER — Telehealth: Payer: Medicaid Other | Admitting: Registered Nurse

## 2022-02-15 ENCOUNTER — Ambulatory Visit: Payer: Medicaid Other

## 2022-02-15 ENCOUNTER — Telehealth: Payer: Self-pay | Admitting: Registered Nurse

## 2022-02-15 ENCOUNTER — Other Ambulatory Visit: Payer: Medicaid Other

## 2022-02-15 ENCOUNTER — Ambulatory Visit: Payer: Self-pay

## 2022-02-15 ENCOUNTER — Telehealth (INDEPENDENT_AMBULATORY_CARE_PROVIDER_SITE_OTHER): Payer: Medicaid Other | Admitting: Registered Nurse

## 2022-02-15 DIAGNOSIS — J019 Acute sinusitis, unspecified: Secondary | ICD-10-CM

## 2022-02-15 DIAGNOSIS — B9689 Other specified bacterial agents as the cause of diseases classified elsewhere: Secondary | ICD-10-CM

## 2022-02-15 MED ORDER — AMOXICILLIN 875 MG PO TABS: 875.0000 mg | ORAL_TABLET | Freq: Two times a day (BID) | ORAL | 0 refills | Status: AC

## 2022-02-15 NOTE — Progress Notes (Signed)
Telemedicine Encounter- SOAP NOTE Established Patient  This telephone encounter was conducted with the patient's (or proxy's) verbal consent via audio telecommunications: yes/no: Yes Patient was instructed to have this encounter in a suitably private space; and to only have persons present to whom they give permission to participate. In addition, patient identity was confirmed by use of name plus two identifiers (DOB and address).  I discussed the limitations, risks, security and privacy concerns of performing an evaluation and management service by telephone and the availability of in person appointments. I also discussed with the patient that there may be a patient responsible charge related to this service. The patient expressed understanding and agreed to proceed.  I spent a total of 17 minutes talking with the patient or their proxy.  Patient at home Provider in office  Participants: Jari Sportsman, NP and Mindi Curling  No chief complaint on file.   Subjective   Hayley Webb is a 36 y.o. established patient. Telephone visit today for URI  HPI Onset 1 week ago with sneeze, runny nose.  Noted swollen cervical lymph nodes.  Lymph nodes have stabilized, but still somewhat swollen. Some PND with cough ongoing.  Notes some congestion is traveling down towards lower respiratory  Denies nvd, chest pain, headache, visual changes, palpitations  She is breastfeeding currently.   Patient Active Problem List   Diagnosis Date Noted   Preeclampsia 01/25/2022   Left knee pain 10/20/2020   Right knee pain 10/12/2020   Knee pain 01/12/2020   Postpartum anemia 01/17/2019   Normal postpartum course 01/17/2019   Chronic hypertension complicating or reason for care during pregnancy, third trimester 01/12/2019   Threatened preterm labor 12/25/2018   Threatened preterm labor, third trimester 12/23/2018   Preterm labor 12/23/2018   Depressive disorder 12/23/2018   Anxiety disorder  12/23/2018   Sicca syndrome (HCC) 08/27/2018   Chronic hypertension affecting pregnancy 07/25/2018   Pregnant 07/01/2018   Maternal obesity affecting pregnancy, antepartum 06/11/2018   Allergy to shellfish 06/11/2018   Chest pain 04/04/2017   On continuous oral anticoagulation 04/04/2017   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   Nonintractable headache 04/04/2017   Altered thought processes 04/04/2017   Word finding difficulty 04/04/2017   Antiphospholipid antibody syndrome (HCC) 02/10/2017   Pulmonary embolus (HCC) 02/09/2017   Pulmonary embolism (HCC) 12/26/2016   Shortness of breath    History of pulmonary embolus (PE) 07/24/2016   Obesity-BMI 47 05/09/2013   Hirsutism 10/02/2012   STD (female) 01/18/2012   Lupus (HCC)    Asthma    HSV infection     Past Medical History:  Diagnosis Date   Anxiety    doing good now   Asthma    Depression    doing good   Headache(784.0)    HSV infection    Hypertension    was taken off meds after last preg   Infection    UTI   Lupus (HCC)    dx age 56   Pulmonary embolism (HCC) 06/4/82011   STD (sexually transmitted disease) 02/2009   POSITIVE GC   UTI (urinary tract infection)     Current Outpatient Medications  Medication Sig Dispense Refill   amoxicillin (AMOXIL) 875 MG tablet Take 1 tablet (875 mg total) by mouth 2 (two) times daily for 7 days. 14 tablet 0   acetaminophen (TYLENOL) 325 MG tablet Take 2 tablets (650 mg total) by mouth every 6 (six) hours as needed for headache or mild pain (for pain scale <  4  OR  temperature  >/=  100.5 F). 60 tablet 3   albuterol (PROVENTIL) (2.5 MG/3ML) 0.083% nebulizer solution Take 3 mLs (2.5 mg total) by nebulization every 6 (six) hours as needed for wheezing or shortness of breath. 75 mL 0   albuterol (VENTOLIN HFA) 108 (90 Base) MCG/ACT inhaler Inhale 2 puffs into the lungs every 6 (six) hours as needed for wheezing or shortness of breath. 18 g 0   cholecalciferol (VITAMIN D3)  25 MCG (1000 UNIT) tablet Take 2,000 Units by mouth daily.     cyclobenzaprine (FLEXERIL) 10 MG tablet Take 1 tablet (10 mg total) by mouth 3 (three) times daily as needed for muscle spasms (cramping). 30 tablet 0   enoxaparin (LOVENOX) 150 MG/ML injection Inject 150 mg into the skin every 12 (twelve) hours.     EPINEPHrine 0.1 MG/0.1ML SOAJ Inject 1 Dose as directed as needed (for anaphylaxis). (Patient not taking: Reported on 01/04/2022) 2 each 2   hydroxychloroquine (PLAQUENIL) 200 MG tablet Take by mouth daily.     ibuprofen (ADVIL) 600 MG tablet Take 1 tablet (600 mg total) by mouth every 6 (six) hours as needed for headache or cramping. 60 tablet 3   labetalol (NORMODYNE) 200 MG tablet Take 2 tablets (400 mg total) by mouth 2 (two) times daily. 30 tablet 3   Multiple Vitamin (MULTIVITAMIN) capsule Take 1 capsule by mouth daily.     No current facility-administered medications for this visit.    Allergies  Allergen Reactions   Bactrim Anaphylaxis and Hives   Hydrocodone-Acetaminophen Anaphylaxis   Nifedipine Swelling and Anaphylaxis    Swelling of tongue   Pineapple Itching   Prednisone Anaphylaxis and Hives    Has been on Dexamethasone without issue.   Shellfish Allergy Itching   Sulfamethoxazole-Trimethoprim Other (See Comments)    Complications due to lupus   Other Itching    Shrimp:throat itching "can eat other shellfish"   Vicodin [Hydrocodone-Acetaminophen] Hives and Swelling    Can take Percocet and Tylenol without reaction.    Social History   Socioeconomic History   Marital status: Married    Spouse name: Not on file   Number of children: Not on file   Years of education: Not on file   Highest education level: Not on file  Occupational History   Not on file  Tobacco Use   Smoking status: Never   Smokeless tobacco: Never  Vaping Use   Vaping Use: Never used  Substance and Sexual Activity   Alcohol use: No   Drug use: No   Sexual activity: Yes    Partners:  Male    Birth control/protection: None  Other Topics Concern   Not on file  Social History Narrative   Not on file   Social Determinants of Health   Financial Resource Strain: Low Risk  (01/12/2019)   Overall Financial Resource Strain (CARDIA)    Difficulty of Paying Living Expenses: Not hard at all  Food Insecurity: No Food Insecurity (01/12/2019)   Hunger Vital Sign    Worried About Running Out of Food in the Last Year: Never true    Ran Out of Food in the Last Year: Never true  Transportation Needs: No Transportation Needs (01/12/2019)   PRAPARE - Administrator, Civil Service (Medical): No    Lack of Transportation (Non-Medical): No  Physical Activity: Unknown (01/12/2019)   Exercise Vital Sign    Days of Exercise per Week: 0 days    Minutes  of Exercise per Session: Not on file  Stress: No Stress Concern Present (01/12/2019)   [employer] of Occupational Health - Occupational Stress Questionnaire    Feeling of Stress : Not at all  Social Connections: Moderately Integrated (01/12/2019)   Social Connection and Isolation Panel [NHANES]    Frequency of Communication with Friends and Family: More than three times a week    Frequency of Social Gatherings with Friends and Family: Twice a week    Attends Religious Services: 1 to 4 times per year    Active Member of [employer] or Organizations: No    Attends Banker Meetings: Never    Marital Status: Living with partner  Intimate Partner Violence: Not At Risk (01/12/2019)   Humiliation, Afraid, Rape, and Kick questionnaire    Fear of Current or Ex-Partner: No    Emotionally Abused: No    Physically Abused: No    Sexually Abused: No    ROS  Objective   Vitals as reported by the patient: There were no vitals filed for this visit.  Diagnoses and all orders for this visit:  Acute bacterial sinusitis -     amoxicillin (AMOXIL) 875 MG tablet; Take 1 tablet (875 mg total) by mouth 2 (two) times daily for 7  days.    PLAN Given swollen nodes and a week of illness combined with recent hospital exposures, will tx with abx. Advise supportive care as allowed by OB/neonatal teams. She will follow up if worsening or failing to improve .Patient encouraged to call clinic with any questions, comments, or concerns.   I discussed the assessment and treatment plan with the patient. The patient was provided an opportunity to ask questions and all were answered. The patient agreed with the plan and demonstrated an understanding of the instructions.   The patient was advised to call back or seek an in-person evaluation if the symptoms worsen or if the condition fails to improve as anticipated.  I provided 17 minutes of non-face-to-face time during this encounter.  Janeece Agee, NP

## 2022-02-15 NOTE — Lactation Note (Signed)
This note was copied from a baby's chart.  NICU Lactation Consultation Note  Patient Name: Hayley Webb TDHRC'B Date: 02/15/2022 Age:34 wk.o.   Subjective Reason for consult: Follow-up assessment Infant is bf'ing ad lib and receiving 3 fortified bottles/day. Mother is latching independently. She remains concerned that L breast production is low. We reviewed strategies to improve.   Objective Infant data: Mother's Current Feeding Choice: Breast Milk  Infant feeding assessment Scale for Readiness: 2 Scale for Quality: 2     Maternal data: [id number]  Vaginal, Spontaneous  Pumping frequency: q3h + breastfeeding Pumped volume: 70 mL  WIC Program: Yes WIC Referral Sent?: Yes Pump: Personal (Motif)  Assessment Infant: LATCH Score: 10   Maternal: Milk volume: Normal   Intervention/Plan Interventions: Education  Tools: Nipple Shields  Plan: Consult Status: NICU follow-up  NICU Follow-up type: Weekly NICU follow up  Mother to continue current poc. She may try pumping L breast solo to see if volume improves.   Elder Negus 02/15/2022, 3:57 PM

## 2022-02-21 ENCOUNTER — Encounter: Payer: Medicaid Other | Admitting: Registered Nurse

## 2022-02-21 ENCOUNTER — Telehealth: Payer: Self-pay

## 2022-02-21 DIAGNOSIS — F53 Postpartum depression: Secondary | ICD-10-CM

## 2022-02-21 MED ORDER — SERTRALINE HCL 50 MG PO TABS: ORAL_TABLET | ORAL | 0 refills | Status: DC

## 2022-02-21 NOTE — Progress Notes (Signed)
error 

## 2022-02-21 NOTE — Patient Instructions (Signed)
Ms. Ottey:  Abbe Amsterdam, MD Jarold Motto, [place] Jacquiline Doe, MD Glenetta Hew, MD Letta Moynahan Early, NP Jiles Prows, DNP

## 2022-02-23 ENCOUNTER — Encounter: Payer: Self-pay | Admitting: Registered Nurse

## 2022-02-23 NOTE — Telephone Encounter (Signed)
Sent via MyChart ° °Thanks, ° °Rich

## 2022-03-06 NOTE — Telephone Encounter (Signed)
error 

## 2022-03-21 DIAGNOSIS — N898 Other specified noninflammatory disorders of vagina: Secondary | ICD-10-CM | POA: Diagnosis not present

## 2022-04-20 NOTE — Progress Notes (Signed)
This encounter was created in error - please disregard.

## 2022-04-26 ENCOUNTER — Other Ambulatory Visit: Payer: Self-pay | Admitting: Obstetrics and Gynecology

## 2022-04-27 ENCOUNTER — Other Ambulatory Visit: Payer: Self-pay | Admitting: Obstetrics and Gynecology

## 2022-04-28 ENCOUNTER — Encounter (HOSPITAL_COMMUNITY): Payer: Self-pay | Admitting: Obstetrics and Gynecology

## 2022-04-28 ENCOUNTER — Other Ambulatory Visit: Payer: Self-pay

## 2022-04-28 NOTE — Progress Notes (Signed)
Spoke with pt for pre-op call. Pt has hx of 3 PE's in the past. She has been on Lovenox prior to surgery. She states she was instructed not to take Sunday PM and Monday AM. Pt has hx of HTN and Lupus. States her Lupus is under control.   Shower instructions given to pt and she voiced understanding.

## 2022-05-01 ENCOUNTER — Ambulatory Visit (HOSPITAL_COMMUNITY)
Admission: RE | Admit: 2022-05-01 | Payer: Medicaid Other | Source: Home / Self Care | Admitting: Obstetrics and Gynecology

## 2022-05-01 HISTORY — DX: Pneumonia, unspecified organism: J18.9

## 2022-05-01 SURGERY — SALPINGECTOMY, BILATERAL, LAPAROSCOPIC
Anesthesia: Choice | Laterality: Bilateral

## 2022-07-06 ENCOUNTER — Telehealth: Payer: Medicaid Other | Admitting: Family Medicine

## 2022-07-06 DIAGNOSIS — J019 Acute sinusitis, unspecified: Secondary | ICD-10-CM | POA: Diagnosis not present

## 2022-07-06 DIAGNOSIS — B9689 Other specified bacterial agents as the cause of diseases classified elsewhere: Secondary | ICD-10-CM

## 2022-07-06 DIAGNOSIS — B379 Candidiasis, unspecified: Secondary | ICD-10-CM

## 2022-07-06 DIAGNOSIS — T3695XA Adverse effect of unspecified systemic antibiotic, initial encounter: Secondary | ICD-10-CM

## 2022-07-06 MED ORDER — PSEUDOEPH-BROMPHEN-DM 30-2-10 MG/5ML PO SYRP: 5.0000 mL | ORAL_SOLUTION | Freq: Four times a day (QID) | ORAL | 0 refills | Status: DC | PRN

## 2022-07-06 MED ORDER — FLUCONAZOLE 150 MG PO TABS: 150.0000 mg | ORAL_TABLET | ORAL | 0 refills | Status: DC

## 2022-07-06 MED ORDER — AMOXICILLIN-POT CLAVULANATE 875-125 MG PO TABS: 1.0000 | ORAL_TABLET | Freq: Two times a day (BID) | ORAL | 0 refills | Status: AC

## 2022-07-06 NOTE — Progress Notes (Signed)
Virtual Visit Consent   Hayley Webb, you are scheduled for a virtual visit with a [facility] provider today. Just as with appointments in the office, your consent must be obtained to participate. Your consent will be active for this visit and any virtual visit you may have with one of our providers in the next 365 days. If you have a MyChart account, a copy of this consent can be sent to you electronically.  As this is a virtual visit, video technology does not allow for your provider to perform a traditional examination. This may limit your provider's ability to fully assess your condition. If your provider identifies any concerns that need to be evaluated in person or the need to arrange testing (such as labs, EKG, etc.), we will make arrangements to do so. Although advances in technology are sophisticated, we cannot ensure that it will always work on either your end or our end. If the connection with a video visit is poor, the visit may have to be switched to a telephone visit. With either a video or telephone visit, we are not always able to ensure that we have a secure connection.  By engaging in this virtual visit, you consent to the provision of healthcare and authorize for your insurance to be billed (if applicable) for the services provided during this visit. Depending on your insurance coverage, you may receive a charge related to this service.  I need to obtain your verbal consent now. Are you willing to proceed with your visit today? Gabriellia Houston has provided verbal consent on 07/06/2022 for a virtual visit (video or telephone). Perlie Mayo, NP  Date: 07/06/2022 1:16 PM  Virtual Visit via Video Note   I, Perlie Mayo, connected with  Kadiedra Jarzabek  ([id number], March 21, 1986) on 07/06/22 at  1:15 PM EST by a video-enabled telemedicine application and verified that I am speaking with the correct person using two identifiers.  Location: Patient: Virtual Visit Location Patient:  Home Provider: Virtual Visit Location Provider: Home Office   I discussed the limitations of evaluation and management by telemedicine and the availability of in person appointments. The patient expressed understanding and agreed to proceed.    History of Present Illness: Hayley Webb is a 36 y.o. who identifies as a female who was assigned female at birth, and is being seen today for sinus symptoms.   HPI: Sinus Problem This is a new problem. The current episode started 1 to 4 weeks ago (06/20/22). The problem has been gradually worsening since onset. There has been no fever. Associated symptoms include chills, congestion, coughing, a hoarse voice, sinus pressure, a sore throat and swollen glands. Pertinent negatives include no diaphoresis, ear pain, headaches, neck pain, shortness of breath or sneezing. Treatments tried: nyquil and dayquil. The treatment provided mild relief.    Denies known sick contacts, but has been around a sick family member  Problems:  Patient Active Problem List   Diagnosis Date Noted   Preeclampsia 01/25/2022   Left knee pain 10/20/2020   Right knee pain 10/12/2020   Knee pain 01/12/2020   Postpartum anemia 01/17/2019   Normal postpartum course 01/17/2019   Chronic hypertension complicating or reason for care during pregnancy, third trimester 01/12/2019   Threatened preterm labor 12/25/2018   Threatened preterm labor, third trimester 12/23/2018   Preterm labor 12/23/2018   Depressive disorder 12/23/2018   Anxiety disorder 12/23/2018   Sicca syndrome ([place]) 08/27/2018   Chronic hypertension affecting pregnancy 07/25/2018   Pregnant 07/01/2018  Maternal obesity affecting pregnancy, antepartum 06/11/2018   Allergy to shellfish 06/11/2018   Chest pain 04/04/2017   On continuous oral anticoagulation 04/04/2017   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[place]) 04/04/2017   Nonintractable headache 04/04/2017   Altered thought processes 04/04/2017   Word finding  difficulty 04/04/2017   Antiphospholipid antibody syndrome ([place]) 02/10/2017   Pulmonary embolus ([place]) 02/09/2017   Pulmonary embolism ([place]) 12/26/2016   Shortness of breath    History of pulmonary embolus (PE) 07/24/2016   Obesity-BMI 47 05/09/2013   Hirsutism 10/02/2012   STD (female) 01/18/2012   Lupus ([place])    Asthma    HSV infection     Allergies:  Allergies  Allergen Reactions   Bactrim Anaphylaxis and Hives   Hydrocodone-Acetaminophen Anaphylaxis   Nifedipine Swelling and Anaphylaxis    Swelling of tongue   Pineapple Itching   Prednisone Anaphylaxis and Hives    Has been on Dexamethasone without issue.   Shrimp Extract Allergy Skin Test Itching   Medications:  Current Outpatient Medications:    acetaminophen (TYLENOL) 325 MG tablet, Take 2 tablets (650 mg total) by mouth every 6 (six) hours as needed for headache or mild pain (for pain scale < 4  OR  temperature  >/=  100.5 F). (Patient not taking: Reported on 04/27/2022), Disp: 60 tablet, Rfl: 3   albuterol (PROVENTIL) (2.5 MG/3ML) 0.083% nebulizer solution, Take 3 mLs (2.5 mg total) by nebulization every 6 (six) hours as needed for wheezing or shortness of breath. (Patient not taking: Reported on 04/27/2022), Disp: 75 mL, Rfl: 0   albuterol (VENTOLIN HFA) 108 (90 Base) MCG/ACT inhaler, Inhale 2 puffs into the lungs every 6 (six) hours as needed for wheezing or shortness of breath., Disp: 18 g, Rfl: 0   cholecalciferol (VITAMIN D3) 25 MCG (1000 UNIT) tablet, Take 2,000 Units by mouth daily., Disp: , Rfl:    cyclobenzaprine (FLEXERIL) 10 MG tablet, Take 1 tablet (10 mg total) by mouth 3 (three) times daily as needed for muscle spasms (cramping). (Patient not taking: Reported on 04/27/2022), Disp: 30 tablet, Rfl: 0   enoxaparin (LOVENOX) 150 MG/ML injection, Inject 150 mg into the skin every 12 (twelve) hours., Disp: , Rfl:    EPINEPHrine 0.1 MG/0.1ML SOAJ, Inject 1 Dose as directed as needed (for anaphylaxis). (Patient not taking:  Reported on 01/04/2022), Disp: 2 each, Rfl: 2   fluconazole (DIFLUCAN) 150 MG tablet, Take 150 mg by mouth every 3 (three) days., Disp: , Rfl:    ibuprofen (ADVIL) 600 MG tablet, Take 1 tablet (600 mg total) by mouth every 6 (six) hours as needed for headache or cramping. (Patient not taking: Reported on 04/27/2022), Disp: 60 tablet, Rfl: 3   labetalol (NORMODYNE) 200 MG tablet, Take 2 tablets (400 mg total) by mouth 2 (two) times daily., Disp: 30 tablet, Rfl: 3   sertraline (ZOLOFT) 50 MG tablet, Take 0.5 tablets (25 mg total) by mouth daily for 8 days, THEN 1 tablet (50 mg total) daily. (Patient taking differently: Take 50 mg by mouth daily), Disp: 86 tablet, Rfl: 0  Observations/Objective: Patient is well-developed, well-nourished in no acute distress.  Resting comfortably  at home.  Head is normocephalic, atraumatic.  No labored breathing.  Speech is clear and coherent with logical content.  Patient is alert and oriented at baseline.    Assessment and Plan: 1. Acute bacterial sinusitis  - amoxicillin-clavulanate (AUGMENTIN) 875-125 MG tablet; Take 1 tablet by mouth 2 (two) times daily for 7 days.  Dispense: 14 tablet; Refill:  0 - brompheniramine-pseudoephedrine-DM 30-2-10 MG/5ML syrup; Take 5 mLs by mouth 4 (four) times daily as needed.  Dispense: 120 mL; Refill: 0  2. Antibiotic-induced yeast infection  - fluconazole (DIFLUCAN) 150 MG tablet; Take 1 tablet (150 mg total) by mouth as directed. May repeat in 3 days if needed  Dispense: 1 tablet; Refill: 0  -pt is not pregnant  -Take meds as prescribed -Rest -Use a cool mist humidifier especially during the winter months when heat dries out the air. - Use saline nose sprays frequently to help soothe nasal passages and promote drainage. -Saline irrigations of the nose can be very helpful if done frequently.             * 4X daily for 1 week*             * Use of a nettie pot can be helpful with this.  *Follow directions with  this* *Boiled or distilled water only -stay hydrated by drinking plenty of fluids - Keep thermostat turn down low to prevent drying out sinuses - For any cough or congestion- robitussin DM or Delsym as needed - For fever or aches or pains- take tylenol or ibuprofen as directed on bottle             * for fevers greater than 101 orally you may alternate ibuprofen and tylenol every 3 hours.  If you do not improve you will need a follow up visit in person.                 Reviewed side effects, risks and benefits of medication.    Patient acknowledged agreement and understanding of the plan.   Past Medical, Surgical, Social History, Allergies, and Medications have been Reviewed.    Follow Up Instructions: I discussed the assessment and treatment plan with the patient. The patient was provided an opportunity to ask questions and all were answered. The patient agreed with the plan and demonstrated an understanding of the instructions.  A copy of instructions were sent to the patient via MyChart unless otherwise noted below.     The patient was advised to call back or seek an in-person evaluation if the symptoms worsen or if the condition fails to improve as anticipated.  Time:  I spent 7 minutes with the patient via telehealth technology discussing the above problems/concerns.    Freddy Finner, NP

## 2022-07-06 NOTE — Patient Instructions (Signed)
Hayley Webb, thank you for joining Hayley Finner, NP for today's virtual visit.  While this provider is not your primary care provider (PCP), if your PCP is located in our provider database this encounter information will be shared with them immediately following your visit.   A [facility] MyChart account gives you access to today's visit and all your visits, tests, and labs performed at [facility] " click here if you don't have a [facility] MyChart account or go to [email or web address]  Consent: (Patient) Hayley Webb provided verbal consent for this virtual visit at the beginning of the encounter.  Current Medications:  Current Outpatient Medications:    acetaminophen (TYLENOL) 325 MG tablet, Take 2 tablets (650 mg total) by mouth every 6 (six) hours as needed for headache or mild pain (for pain scale < 4  OR  temperature  >/=  100.5 F). (Patient not taking: Reported on 04/27/2022), Disp: 60 tablet, Rfl: 3   albuterol (PROVENTIL) (2.5 MG/3ML) 0.083% nebulizer solution, Take 3 mLs (2.5 mg total) by nebulization every 6 (six) hours as needed for wheezing or shortness of breath. (Patient not taking: Reported on 04/27/2022), Disp: 75 mL, Rfl: 0   albuterol (VENTOLIN HFA) 108 (90 Base) MCG/ACT inhaler, Inhale 2 puffs into the lungs every 6 (six) hours as needed for wheezing or shortness of breath., Disp: 18 g, Rfl: 0   cholecalciferol (VITAMIN D3) 25 MCG (1000 UNIT) tablet, Take 2,000 Units by mouth daily., Disp: , Rfl:    cyclobenzaprine (FLEXERIL) 10 MG tablet, Take 1 tablet (10 mg total) by mouth 3 (three) times daily as needed for muscle spasms (cramping). (Patient not taking: Reported on 04/27/2022), Disp: 30 tablet, Rfl: 0   enoxaparin (LOVENOX) 150 MG/ML injection, Inject 150 mg into the skin every 12 (twelve) hours., Disp: , Rfl:    EPINEPHrine 0.1 MG/0.1ML SOAJ, Inject 1 Dose as directed as needed (for anaphylaxis). (Patient not taking: Reported on 01/04/2022), Disp: 2  each, Rfl: 2   fluconazole (DIFLUCAN) 150 MG tablet, Take 150 mg by mouth every 3 (three) days., Disp: , Rfl:    ibuprofen (ADVIL) 600 MG tablet, Take 1 tablet (600 mg total) by mouth every 6 (six) hours as needed for headache or cramping. (Patient not taking: Reported on 04/27/2022), Disp: 60 tablet, Rfl: 3   labetalol (NORMODYNE) 200 MG tablet, Take 2 tablets (400 mg total) by mouth 2 (two) times daily., Disp: 30 tablet, Rfl: 3   sertraline (ZOLOFT) 50 MG tablet, Take 0.5 tablets (25 mg total) by mouth daily for 8 days, THEN 1 tablet (50 mg total) daily. (Patient taking differently: Take 50 mg by mouth daily), Disp: 86 tablet, Rfl: 0   Medications ordered in this encounter:  No orders of the defined types were placed in this encounter.    *If you need refills on other medications prior to your next appointment, please contact your pharmacy*  Follow-Up: Call back or seek an in-person evaluation if the symptoms worsen or if the condition fails to improve as anticipated.  [facility] [phone]  Other Instructions -Take meds as prescribed -Rest -Use a cool mist humidifier especially during the winter months when heat dries out the air. - Use saline nose sprays frequently to help soothe nasal passages and promote drainage. -Saline irrigations of the nose can be very helpful if done frequently.             * 4X daily for 1 week*             *  Use of a nettie pot can be helpful with this.  *Follow directions with this* *Boiled or distilled water only -stay hydrated by drinking plenty of fluids - Keep thermostat turn down low to prevent drying out sinuses - For any cough or congestion- robitussin DM or Delsym as needed - For fever or aches or pains- take tylenol or ibuprofen as directed on bottle             * for fevers greater than 101 orally you may alternate ibuprofen and tylenol every 3 hours.  If you do not improve you will need a follow up visit in person.                    If you have been instructed to have an in-person evaluation today at a local Urgent Care facility, please use the link below. It will take you to a list of all of our available [facility], including address, phone number and hours of operation. Please do not delay care.  [facility]  If you or a family member do not have a primary care provider, use the link below to schedule a visit and establish care. When you choose a [facility] primary care physician or advanced practice provider, you gain a long-term partner in health. Find a Primary Care Provider  Learn more about [facility] in-office and virtual care options: [facility] - Get Care Now

## 2022-07-24 ENCOUNTER — Encounter (HOSPITAL_COMMUNITY): Payer: Self-pay

## 2022-07-24 ENCOUNTER — Inpatient Hospital Stay (HOSPITAL_COMMUNITY)
Admission: AD | Admit: 2022-07-24 | Discharge: 2022-07-24 | Disposition: A | Discharge status: Home / Self Care | Payer: Medicaid Other | Attending: Obstetrics & Gynecology | Admitting: Obstetrics & Gynecology

## 2022-07-24 ENCOUNTER — Inpatient Hospital Stay (HOSPITAL_COMMUNITY): Payer: Medicaid Other

## 2022-07-24 ENCOUNTER — Other Ambulatory Visit: Payer: Self-pay

## 2022-07-24 DIAGNOSIS — O039 Complete or unspecified spontaneous abortion without complication: Secondary | ICD-10-CM | POA: Diagnosis present

## 2022-07-24 DIAGNOSIS — Z3A00 Weeks of gestation of pregnancy not specified: Secondary | ICD-10-CM | POA: Diagnosis not present

## 2022-07-24 LAB — CBC
HCT: 34.8 % — ABNORMAL LOW (ref 36.0–46.0)
HCT: 33.1 % — ABNORMAL LOW (ref 36.0–46.0)
Hemoglobin: 11.5 g/dL — ABNORMAL LOW (ref 12.0–15.0)
Hemoglobin: 11 g/dL — ABNORMAL LOW (ref 12.0–15.0)
MCH: 28.3 pg (ref 26.0–34.0)
MCH: 28.4 pg (ref 26.0–34.0)
MCHC: 33 g/dL (ref 30.0–36.0)
MCHC: 33.2 g/dL (ref 30.0–36.0)
MCV: 85.5 fL (ref 80.0–100.0)
MCV: 85.5 fL (ref 80.0–100.0)
Platelets: 358 10*3/uL (ref 150–400)
Platelets: 359 10*3/uL (ref 150–400)
RBC: 4.07 MIL/uL (ref 3.87–5.11)
RBC: 3.87 MIL/uL (ref 3.87–5.11)
RDW: 13.9 % (ref 11.5–15.5)
RDW: 14.1 % (ref 11.5–15.5)
WBC: 7.6 10*3/uL (ref 4.0–10.5)
WBC: 8.1 10*3/uL (ref 4.0–10.5)
nRBC: 0 % (ref 0.0–0.2)
nRBC: 0 % (ref 0.0–0.2)

## 2022-07-24 LAB — BASIC METABOLIC PANEL
Anion gap: 8 (ref 5–15)
BUN: 10 mg/dL (ref 6–20)
CO2: 24 mmol/L (ref 22–32)
Calcium: 8.8 mg/dL — ABNORMAL LOW (ref 8.9–10.3)
Chloride: 105 mmol/L (ref 98–111)
Creatinine, Ser: 0.77 mg/dL (ref 0.44–1.00)
GFR, Estimated: >60 mL/min (ref >60)
Glucose, Bld: 64 mg/dL — ABNORMAL LOW (ref 70–99)
Potassium: 3.6 mmol/L (ref 3.5–5.1)
Sodium: 137 mmol/L (ref 135–145)

## 2022-07-24 LAB — TYPE AND SCREEN
ABO/RH(D): O POS
Antibody Screen: NEGATIVE

## 2022-07-24 LAB — HCG, QUANTITATIVE, PREGNANCY: hCG, Beta Chain, Quant, S: 9506 m[IU]/mL — ABNORMAL HIGH (ref <5)

## 2022-07-24 MED ORDER — OXYCODONE-ACETAMINOPHEN 5-325 MG PO TABS: 2.0000 | ORAL_TABLET | ORAL | 0 refills | Status: AC | PRN

## 2022-07-24 MED ORDER — OXYCODONE-ACETAMINOPHEN 5-325 MG PO TABS
2.0000 | ORAL_TABLET | Freq: Once | ORAL | Status: AC
  Administered 2022-07-24: 2 via ORAL
  Filled 2022-07-24: qty 2

## 2022-07-24 NOTE — MAU Provider Note (Signed)
History     CSN: [id number]  Arrival date and time: 07/24/22 1439   Event Date/Time   First Provider Initiated Contact with Patient 07/24/22 1448      Chief Complaint  Patient presents with   Abdominal Pain   Vaginal Bleeding    Hayley Webb is a 37 y.o. [id number] in early pregnancy who presents to MAU via EMS with chief complaint of heavy vaginal bleeding. This is a new problem, onset around noon today. She endorses passing an extremely large blood clot (about the size of a small melon) prior to calling EMS. She reports mild but persistent dizziness on initial exam.  Patient also c/o abdominal pain, onset coinciding with onset of heavy bleeding. Pain score 9/10. She has not taken medication for this complaint. Patient states she has previously taken Percocet without allergic reaction.  Patient took Lovenox in pregnancy as indicated by hx of PE. She states she only takes it during pregnancy and postpartum. She has not taken it in about two weeks.  Patient receives care with [facility].  OB History     Gravida  5   Para  3   Term  1   Preterm  2   AB  1   Living  3      SAB  1   IAB      Ectopic      Multiple  0   Live Births  3           Past Medical History:  Diagnosis Date   Anxiety    doing good now   Asthma    COVID 07/2021   mild   Depression    doing good   Headache(784.0)    HSV infection    Hypertension    was taken off meds after last preg   Infection    UTI   Lupus [facility])    dx age 43   Pneumonia    blood clot appeared during the pneumonia   Pulmonary embolism (HCC) 06/4/82011   STD (sexually transmitted disease) 02/2009   POSITIVE GC   UTI (urinary tract infection)     Past Surgical History:  Procedure Laterality Date   ADENOIDECTOMY     TONSILLECTOMY AND ADENOIDECTOMY  1998   WISDOM TOOTH EXTRACTION      Family History  Problem Relation Age of Onset   Asthma Mother    Healthy Father    Asthma Brother    Diabetes Maternal  Aunt    Lupus Paternal Aunt    Stroke Paternal Aunt    Cancer Maternal Grandmother 72       COLON CA   Diabetes Maternal Grandmother    Hypertension Maternal Grandfather    Cancer Maternal Grandfather        prostate   Cancer Paternal Grandmother        breast   Lupus Paternal Grandmother    Anesthesia problems Neg Hx    Hypotension Neg Hx    Malignant hyperthermia Neg Hx    Pseudochol deficiency Neg Hx     Social History   Tobacco Use   Smoking status: Never   Smokeless tobacco: Never  Vaping Use   Vaping Use: Never used  Substance Use Topics   Alcohol use: Yes    Comment: occsinally   Drug use: No    Allergies:  Allergies  Allergen Reactions   Bactrim Anaphylaxis and Hives   Hydrocodone-Acetaminophen Anaphylaxis   Nifedipine Swelling and Anaphylaxis  Swelling of tongue   Pineapple Itching   Prednisone Anaphylaxis and Hives    Has been on Dexamethasone without issue.   Shrimp Extract Allergy Skin Test Itching    Medications Prior to Admission  Medication Sig Dispense Refill Last Dose   acetaminophen (TYLENOL) 325 MG tablet Take 2 tablets (650 mg total) by mouth every 6 (six) hours as needed for headache or mild pain (for pain scale < 4  OR  temperature  >/=  100.5 F). (Patient not taking: Reported on 04/27/2022) 60 tablet 3    albuterol (PROVENTIL) (2.5 MG/3ML) 0.083% nebulizer solution Take 3 mLs (2.5 mg total) by nebulization every 6 (six) hours as needed for wheezing or shortness of breath. (Patient not taking: Reported on 04/27/2022) 75 mL 0    albuterol (VENTOLIN HFA) 108 (90 Base) MCG/ACT inhaler Inhale 2 puffs into the lungs every 6 (six) hours as needed for wheezing or shortness of breath. 18 g 0    brompheniramine-pseudoephedrine-DM 30-2-10 MG/5ML syrup Take 5 mLs by mouth 4 (four) times daily as needed. 120 mL 0    cholecalciferol (VITAMIN D3) 25 MCG (1000 UNIT) tablet Take 2,000 Units by mouth daily.      cyclobenzaprine (FLEXERIL) 10 MG tablet Take 1  tablet (10 mg total) by mouth 3 (three) times daily as needed for muscle spasms (cramping). (Patient not taking: Reported on 04/27/2022) 30 tablet 0    enoxaparin (LOVENOX) 150 MG/ML injection Inject 150 mg into the skin every 12 (twelve) hours.      EPINEPHrine 0.1 MG/0.1ML SOAJ Inject 1 Dose as directed as needed (for anaphylaxis). (Patient not taking: Reported on 01/04/2022) 2 each 2    fluconazole (DIFLUCAN) 150 MG tablet Take 1 tablet (150 mg total) by mouth as directed. May repeat in 3 days if needed 1 tablet 0    ibuprofen (ADVIL) 600 MG tablet Take 1 tablet (600 mg total) by mouth every 6 (six) hours as needed for headache or cramping. (Patient not taking: Reported on 04/27/2022) 60 tablet 3    labetalol (NORMODYNE) 200 MG tablet Take 2 tablets (400 mg total) by mouth 2 (two) times daily. 30 tablet 3    sertraline (ZOLOFT) 50 MG tablet Take 0.5 tablets (25 mg total) by mouth daily for 8 days, THEN 1 tablet (50 mg total) daily. (Patient taking differently: Take 50 mg by mouth daily) 86 tablet 0     Review of Systems  Gastrointestinal:  Positive for abdominal pain.  Genitourinary:  Positive for vaginal bleeding.  All other systems reviewed and are negative.  Physical Exam   Blood pressure 118/69, pulse 85, temperature 97.7 F (36.5 C), resp. rate 18, last menstrual period 05/04/2021, SpO2 100 %, currently breastfeeding.  Physical Exam Vitals and nursing note reviewed. Exam conducted with a chaperone present.  Constitutional:      General: She is in acute distress.     Appearance: She is well-developed. She is obese.  Cardiovascular:     Rate and Rhythm: Normal rate.  Pulmonary:     Effort: Pulmonary effort is normal.  Abdominal:     Palpations: Abdomen is soft.     Tenderness: There is no abdominal tenderness.  Genitourinary:    Comments: Pelvic exam: Very large clot protruding from introitus. Small clot dislodged with fox swab x 1. Negligible active bleeding following removal of  clot   Skin:    Capillary Refill: Capillary refill takes less than 2 seconds.  Neurological:     Mental Status: She  is alert and oriented to person, place, and time.  Psychiatric:        Mood and Affect: Mood normal.        Behavior: Behavior normal.     MAU Course  Procedures  MDM --1620: [name] returned to bedside. Reviewed ultrasound and lab results. Very large clot on external mons pubis. Pericare performed by [name] --1720: [name] returned to bedside. Patient has just ambulated to bathroom with standby assistance from RN x 2. Patient ready to eat, smiling, looking significantly improved. Updated plan of care to include rechecking CBC prior to discharge. Pad inspected, one small clot, no passage of clots or notable bleeding with abdominal massage --1900: [name] contacted Dr. Sallye Ober, report given. Labs, [place] and evaluation of bleeding reviewed.  Confirmed I feel patient is stable for discharge home with strict return precautions and my Attending Dr. Berton Lan agrees. Dr. Sallye Ober will message office to schedule one week follow up.  Patient Vitals for the past 24 hrs:  BP Temp Pulse Resp SpO2  07/24/22 1940 110/73 -- 91 -- --  07/24/22 1937 (!) 94/48 -- 90 -- --  07/24/22 1933 (!) 82/43 -- 89 -- --  07/24/22 1702 132/72 -- 79 -- --  07/24/22 1632 126/88 -- 84 -- --  07/24/22 1602 138/81 -- 78 -- --  07/24/22 1551 -- -- -- -- 99 %  07/24/22 1547 135/76 -- 77 -- --  07/24/22 1541 -- -- -- -- 100 %  07/24/22 1532 127/72 -- 80 -- --  07/24/22 1521 -- -- -- -- 98 %  07/24/22 1517 123/75 -- 81 -- --  07/24/22 1514 128/71 -- 83 -- --  07/24/22 1500 118/69 97.7 F (36.5 C) 85 18 100 %   Results for orders placed or performed during the hospital encounter of 07/24/22 (from the past 24 hour(s))  hCG, quantitative, pregnancy     Status: Abnormal   Collection Time: 07/24/22  3:02 PM  Result Value Ref Range   hCG, Beta Chain, Quant, S 9,506 (H) <5 mIU/mL  Type and screen [facility]      Status: None   Collection Time: 07/24/22  3:03 PM  Result Value Ref Range   ABO/RH(D) O POS    Antibody Screen NEG    Sample Expiration      07/27/2022,2359 Performed at [facility], [address] [place], [place] [address]   CBC     Status: Abnormal   Collection Time: 07/24/22  3:08 PM  Result Value Ref Range   WBC 7.6 4.0 - 10.5 K/uL   RBC 4.07 3.87 - 5.11 MIL/uL   Hemoglobin 11.5 (L) 12.0 - 15.0 g/dL   HCT 94.7 (L) 09.6 - 28.3 %   MCV 85.5 80.0 - 100.0 fL   MCH 28.3 26.0 - 34.0 pg   MCHC 33.0 30.0 - 36.0 g/dL   RDW 66.2 94.7 - 65.4 %   Platelets 358 150 - 400 K/uL   nRBC 0.0 0.0 - 0.2 %  Basic metabolic panel     Status: Abnormal   Collection Time: 07/24/22  3:08 PM  Result Value Ref Range   Sodium 137 135 - 145 mmol/L   Potassium 3.6 3.5 - 5.1 mmol/L   Chloride 105 98 - 111 mmol/L   CO2 24 22 - 32 mmol/L   Glucose, Bld 64 (L) 70 - 99 mg/dL   BUN 10 6 - 20 mg/dL   Creatinine, Ser 6.50 0.44 - 1.00 mg/dL  Calcium 8.8 (L) 8.9 - 10.3 mg/dL   GFR, Estimated >60 >60 mL/min   Anion gap 8 5 - 15  CBC     Status: Abnormal   Collection Time: 07/24/22  6:06 PM  Result Value Ref Range   WBC 8.1 4.0 - 10.5 K/uL   RBC 3.87 3.87 - 5.11 MIL/uL   Hemoglobin 11.0 (L) 12.0 - 15.0 g/dL   HCT 33.1 (L) 36.0 - 46.0 %   MCV 85.5 80.0 - 100.0 fL   MCH 28.4 26.0 - 34.0 pg   MCHC 33.2 30.0 - 36.0 g/dL   RDW 14.1 11.5 - 15.5 %   Platelets 359 150 - 400 K/uL   nRBC 0.0 0.0 - 0.2 %   US OB LESS THAN 14 WEEKS WITH OB TRANSVAGINAL  Result Date: 07/24/2022 CLINICAL DATA:  early pregnancy with vaginal bleeding. EXAM: OBSTETRIC <14 WK [place] AND TRANSVAGINAL OB US TECHNIQUE: Both transabdominal and transvaginal ultrasound examinations were performed for complete evaluation of the gestation as well as the maternal uterus, adnexal regions, and pelvic cul-de-sac. Transvaginal technique was performed to assess early pregnancy. COMPARISON:  None Available. FINDINGS: Intrauterine gestational sac:  None Yolk sac:  Not Visualized. Embryo:  Not Visualized. Cardiac Activity: Not Visualized. Heart Rate: NA  bpm Maternal uterus/adnexae: Subchorionic hemorrhage: NA Right ovary: Normal Left ovary: Normal Other :Sonographer reports patient passing large clots during the examination. Free fluid:  Small volume of free fluid noted within the pelvis. IMPRESSION: 1. No intrauterine gestational sac, yolk sac, or fetal pole identified. Differential considerations include intrauterine pregnancy too early to be sonographically visualized, missed abortion, or ectopic pregnancy. Followup ultrasound is recommended in 10-14 days for further evaluation. 2. Sonographer reports patient is passing large clots during the examination which may reflect abortion in progress. Electronically Signed   By: Kerby Moors M.D.   On: 07/24/2022 16:18    Meds ordered this encounter  Medications   oxyCODONE-acetaminophen (PERCOCET/ROXICET) 5-325 MG per tablet 2 tablet    Patient states she has taken in the past without issue   oxyCODONE-acetaminophen (PERCOCET/ROXICET) 5-325 MG tablet    Sig: Take 2 tablets by mouth every 4 (four) hours as needed for up to 3 days for severe pain.    Dispense:  6 tablet    Refill:  0    Patient has taken in ED and in the past without allergic reaction    Order Specific Question:   Supervising Provider    Answer:   Inez Catalina [[id number]]    Assessment and Plan  --37 y.o. [phone] with early miscarriage --Most significant bleeding prior to MAU arrival --Bleeding small, VSS, pain well-controlled  --Hgb 11.0 on recheck prior to discharge --Rh positive --Care coordinated with Dr. Mikel Cella and Dr. Alesia Richards --Discharge home in stable condition with bleeding/return precautions  F/U: --Dr. Alesia Richards facilitating one week outpatient followup  Darlina Rumpf, [place], MSN, [name] 07/24/2022, 9:02 PM

## 2022-07-24 NOTE — MAU Note (Signed)
Pt wanting to get up to BR.  Minimal bleeding noted . Changed pad. Had pt sit up on bedside for a few minutes had 2 person standby assist to BR. Able to uraninite. Not clots out. Pt tolerated well.

## 2022-07-24 NOTE — MAU Note (Signed)
Sat pt up in bed to prepare for discharge. Stated she felt a little light headed. Had pt lay back for a few minutes and given a cool cloth . Rechecked b/p  x2 and pt norma tensive now and feels better. Assisted pt to get dressed and discharged home via w/c to car.

## 2022-07-24 NOTE — MAU Note (Signed)
Hayley Webb is a 37 y.o. at Unknown here in MAU reporting: arrived via EMS with complaints of abdominal pain and heavy vaginal bleeding. Pt reports she found out she was pregnant last Thursday and was planning to terminate the pregnancy. Today she started having pain and heavy bleeding and has passed multiple large clots. Per EMS she had a near syncopal event. Pt has PIV placed by EMS and received 389ml NS on the truck.   Onset of complaint: today

## 2022-08-01 ENCOUNTER — Ambulatory Visit: Payer: Medicaid Other | Admitting: Family

## 2022-08-01 ENCOUNTER — Encounter: Payer: Self-pay | Admitting: Family

## 2022-08-01 VITALS — BP 136/90 | HR 93 | Temp 98.3°F | Ht 68.5 in | Wt 331.2 lb

## 2022-08-01 DIAGNOSIS — F430 Acute stress reaction: Secondary | ICD-10-CM | POA: Diagnosis not present

## 2022-08-01 DIAGNOSIS — F32A Depression, unspecified: Secondary | ICD-10-CM

## 2022-08-01 DIAGNOSIS — F419 Anxiety disorder, unspecified: Secondary | ICD-10-CM

## 2022-08-01 MED ORDER — BUPROPION HCL ER (XL) 150 MG PO TB24: 150.0000 mg | ORAL_TABLET | Freq: Every day | ORAL | 3 refills | Status: DC

## 2022-08-01 MED ORDER — CLONAZEPAM 0.5 MG PO TABS: 0.5000 mg | ORAL_TABLET | Freq: Two times a day (BID) | ORAL | 1 refills | Status: DC | PRN

## 2022-08-01 NOTE — Progress Notes (Signed)
Acute Office Visit  Subjective:     Patient ID: Hayley Webb, female    DOB: 08-Nov-1985, 37 y.o.   MRN: [id number]  Chief Complaint  Patient presents with  . Anxiety    Pt notes father was recently murdered and pt therapist notes the zoloft is not working well enough and so the pt is here to discuss something different for now     HPI Patient is in today with concerns of depression, anxiety, panic episodes since her father was murdered on 07/19/2022. She reports being the only child and having to handle all of his affairs. She also reports she miscarried a baby on 07/24/22. She admits to having a good support system and is seeing a therapist once a week that helps. She has taken Zoloft and celexa in the past that were not helpful. Denies any feelings of helplessness, hopelessness, no thoughts of death or dying.    Review of Systems  Psychiatric/Behavioral:  Positive for depression. Negative for suicidal ideas. The patient is nervous/anxious.   All other systems reviewed and are negative. Past Medical History:  Diagnosis Date  . Anxiety    doing good now  . Asthma   . COVID 07/2021   mild  . Depression    doing good  . Headache(784.0)   . HSV infection   . Hypertension    was taken off meds after last preg  . Infection    UTI  . Lupus ([place])    dx age 75  . Pneumonia    blood clot appeared during the pneumonia  . Pulmonary embolism ([place]) 06/4/82011  . STD (sexually transmitted disease) 02/2009   POSITIVE GC  . UTI (urinary tract infection)     Social History   Socioeconomic History  . Marital status: Married    Spouse name: Not on file  . Number of children: Not on file  . Years of education: Not on file  . Highest education level: Not on file  Occupational History  . Not on file  Tobacco Use  . Smoking status: Never  . Smokeless tobacco: Never  Vaping Use  . Vaping Use: Never used  Substance and Sexual Activity  . Alcohol use: Yes    Comment: occsinally   . Drug use: No  . Sexual activity: Yes    Partners: Male    Birth control/protection: None  Other Topics Concern  . Not on file  Social History Narrative  . Not on file   Social Determinants of Health   Financial Resource Strain: Low Risk  (01/12/2019)   Overall Financial Resource Strain (CARDIA)   . Difficulty of Paying Living Expenses: Not hard at all  Food Insecurity: No Food Insecurity (01/12/2019)   Hunger Vital Sign   . Worried About Charity fundraiser in the Last Year: Never true   . Ran Out of Food in the Last Year: Never true  Transportation Needs: No Transportation Needs (01/12/2019)   PRAPARE - Transportation   . Lack of Transportation (Medical): No   . Lack of Transportation (Non-Medical): No  Physical Activity: Unknown (01/12/2019)   Exercise Vital Sign   . Days of Exercise per Week: 0 days   . Minutes of Exercise per Session: Not on file  Stress: No Stress Concern Present (01/12/2019)   [place]   . Feeling of Stress : Not at all  Social Connections: Moderately Integrated (01/12/2019)   Social Connection and Isolation Panel [  NHANES]   . Frequency of Communication with Friends and Family: More than three times a week   . Frequency of Social Gatherings with Friends and Family: Twice a week   . Attends Religious Services: 1 to 4 times per year   . Active Member of Clubs or Organizations: No   . Attends Banker Meetings: Never   . Marital Status: Living with partner  Intimate Partner Violence: Not At Risk (01/12/2019)   Humiliation, Afraid, Rape, and Kick questionnaire   . Fear of Current or Ex-Partner: No   . Emotionally Abused: No   . Physically Abused: No   . Sexually Abused: No    Past Surgical History:  Procedure Laterality Date  . ADENOIDECTOMY    . TONSILLECTOMY AND ADENOIDECTOMY  1998  . WISDOM TOOTH EXTRACTION      Family History  Problem Relation Age of Onset  .  Asthma Mother   . Healthy Father   . Asthma Brother   . Diabetes Maternal Aunt   . Lupus Paternal Aunt   . Stroke Paternal Aunt   . Cancer Maternal Grandmother 72       COLON CA  . Diabetes Maternal Grandmother   . Hypertension Maternal Grandfather   . Cancer Maternal Grandfather        prostate  . Cancer Paternal Grandmother        breast  . Lupus Paternal Grandmother   . Anesthesia problems Neg Hx   . Hypotension Neg Hx   . Malignant hyperthermia Neg Hx   . Pseudochol deficiency Neg Hx     Allergies  Allergen Reactions  . Bactrim Anaphylaxis and Hives  . Hydrocodone-Acetaminophen Anaphylaxis  . Nifedipine Swelling and Anaphylaxis    Swelling of tongue  . Pineapple Itching  . Prednisone Anaphylaxis and Hives    Has been on Dexamethasone without issue.  . Shrimp Extract Allergy Skin Test Itching    Current Outpatient Medications on File Prior to Visit  Medication Sig Dispense Refill  . albuterol (VENTOLIN HFA) 108 (90 Base) MCG/ACT inhaler Inhale 2 puffs into the lungs every 6 (six) hours as needed for wheezing or shortness of breath. 18 g 0  . cholecalciferol (VITAMIN D3) 25 MCG (1000 UNIT) tablet Take 2,000 Units by mouth daily.    Marland Kitchen acetaminophen (TYLENOL) 325 MG tablet Take 2 tablets (650 mg total) by mouth every 6 (six) hours as needed for headache or mild pain (for pain scale < 4  OR  temperature  >/=  100.5 F). (Patient not taking: Reported on 04/27/2022) 60 tablet 3  . albuterol (PROVENTIL) (2.5 MG/3ML) 0.083% nebulizer solution Take 3 mLs (2.5 mg total) by nebulization every 6 (six) hours as needed for wheezing or shortness of breath. (Patient not taking: Reported on 04/27/2022) 75 mL 0  . EPINEPHrine 0.1 MG/0.1ML SOAJ Inject 1 Dose as directed as needed (for anaphylaxis). (Patient not taking: Reported on 01/04/2022) 2 each 2  . ibuprofen (ADVIL) 600 MG tablet Take 1 tablet (600 mg total) by mouth every 6 (six) hours as needed for headache or cramping. (Patient not  taking: Reported on 04/27/2022) 60 tablet 3  . sertraline (ZOLOFT) 50 MG tablet Take 0.5 tablets (25 mg total) by mouth daily for 8 days, THEN 1 tablet (50 mg total) daily. (Patient taking differently: Take 50 mg by mouth daily) 86 tablet 0  . [DISCONTINUED] ferrous sulfate 325 (65 FE) MG tablet Take 1 tablet (325 mg total) by mouth 2 (two) times daily with  a meal. 60 tablet 3   No current facility-administered medications on file prior to visit.    BP (!) 136/90   Pulse 93   Temp 98.3 F (36.8 C) (Oral)   Ht 5' 8.5" (1.74 m)   Wt (!) 331 lb 3.2 oz (150.2 kg)   LMP 05/04/2021   SpO2 100%   BMI 49.63 kg/m chart      Objective:    BP (!) 136/90   Pulse 93   Temp 98.3 F (36.8 C) (Oral)   Ht 5' 8.5" (1.74 m)   Wt (!) 331 lb 3.2 oz (150.2 kg)   LMP 05/04/2021   SpO2 100%   BMI 49.63 kg/m    Physical Exam Vitals and nursing note reviewed.  Constitutional:      Appearance: Normal appearance. She is obese.  Cardiovascular:     Rate and Rhythm: Normal rate and regular rhythm.     Pulses: Normal pulses.     Heart sounds: Normal heart sounds.  Pulmonary:     Effort: Pulmonary effort is normal.     Breath sounds: Normal breath sounds.  Musculoskeletal:        General: Normal range of motion.     Cervical back: Normal range of motion and neck supple.  Skin:    General: Skin is warm and dry.  Neurological:     General: No focal deficit present.     Mental Status: She is alert and oriented to person, place, and time.  Psychiatric:        Mood and Affect: Mood normal.        Behavior: Behavior normal.        Judgment: Judgment normal.   No results found for any visits on 08/01/22.      Assessment & Plan:   Problem List Items Addressed This Visit     Depressive disorder   Relevant Medications   buPROPion (WELLBUTRIN XL) 150 MG 24 hr tablet   Anxiety disorder   Relevant Medications   buPROPion (WELLBUTRIN XL) 150 MG 24 hr tablet   Other Visit Diagnoses      Acute stress reaction    -  Primary   Relevant Medications   buPROPion (WELLBUTRIN XL) 150 MG 24 hr tablet       Meds ordered this encounter  Medications  . buPROPion (WELLBUTRIN XL) 150 MG 24 hr tablet    Sig: Take 1 tablet (150 mg total) by mouth daily.    Dispense:  30 tablet    Refill:  3  . clonazePAM (KLONOPIN) 0.5 MG tablet    Sig: Take 1 tablet (0.5 mg total) by mouth 2 (two) times daily as needed for anxiety.    Dispense:  30 tablet    Refill:  1   Call the office with any questions or concerns. Follow-up with new PCP as scheduled and sooner as needed. Continue therapy. Return in about 3 weeks (around 08/22/2022).  Eulis Foster, FNP

## 2022-09-06 ENCOUNTER — Ambulatory Visit: Payer: Medicaid Other | Admitting: Family Medicine

## 2022-09-08 ENCOUNTER — Ambulatory Visit: Payer: Medicaid Other | Admitting: Family Medicine

## 2022-09-22 ENCOUNTER — Telehealth: Payer: Medicaid Other | Admitting: Physician Assistant

## 2022-09-22 DIAGNOSIS — J020 Streptococcal pharyngitis: Secondary | ICD-10-CM | POA: Diagnosis not present

## 2022-09-22 MED ORDER — AMOXICILLIN 875 MG PO TABS: 875.0000 mg | ORAL_TABLET | Freq: Two times a day (BID) | ORAL | 0 refills | Status: AC

## 2022-09-22 MED ORDER — PROMETHAZINE-DM 6.25-15 MG/5ML PO SYRP: 5.0000 mL | ORAL_SOLUTION | Freq: Four times a day (QID) | ORAL | 0 refills | Status: DC | PRN

## 2022-09-22 NOTE — Progress Notes (Signed)
Virtual Visit Consent   [name], you are scheduled for a virtual visit with a [facility] provider today. Just as with appointments in the office, your consent must be obtained to participate. Your consent will be active for this visit and any virtual visit you may have with one of our providers in the next 365 days. If you have a MyChart account, a copy of this consent can be sent to you electronically.  As this is a virtual visit, video technology does not allow for your provider to perform a traditional examination. This may limit your provider's ability to fully assess your condition. If your provider identifies any concerns that need to be evaluated in person or the need to arrange testing (such as labs, EKG, etc.), we will make arrangements to do so. Although advances in technology are sophisticated, we cannot ensure that it will always work on either your end or our end. If the connection with a video visit is poor, the visit may have to be switched to a telephone visit. With either a video or telephone visit, we are not always able to ensure that we have a secure connection.  By engaging in this virtual visit, you consent to the provision of healthcare and authorize for your insurance to be billed (if applicable) for the services provided during this visit. Depending on your insurance coverage, you may receive a charge related to this service.  I need to obtain your verbal consent now. Are you willing to proceed with your visit today? Shayvon Vanblaricum has provided verbal consent on 09/22/2022 for a virtual visit (video or telephone). Mar Daring, PA-C  Date: 09/22/2022 5:17 PM  Virtual Visit via Video Note   I, Mar Daring, connected with  Hayley Webb  ([id number], 1985-12-10) on 09/22/22 at  5:15 PM EST by a video-enabled telemedicine application and verified that I am speaking with the correct person using two identifiers.  Location: Patient: Virtual Visit Location  Patient: Home Provider: Virtual Visit Location Provider: Home Office   I discussed the limitations of evaluation and management by telemedicine and the availability of in person appointments. The patient expressed understanding and agreed to proceed.    History of Present Illness: Hayley Webb is a 37 y.o. who identifies as a female who was assigned female at birth, and is being seen today for sore throat and cough.  HPI: Sore Throat  This is a new problem. The current episode started in the past 7 days. The problem has been gradually worsening. There has been no fever. The pain is moderate. Associated symptoms include congestion, coughing, diarrhea, ear pain (left), headaches and swollen glands. Pertinent negatives include no ear discharge, hoarse voice, plugged ear sensation, shortness of breath or vomiting. Associated symptoms comments: Post nasal drainage, chest congestion, myalgias. She has had exposure to strep. She has had no exposure to mono. Exposure to: nephew. Treatments tried: nyquil severe, elderberry. The treatment provided no relief.     Problems:  Patient Active Problem List   Diagnosis Date Noted   Preeclampsia 01/25/2022   Left knee pain 10/20/2020   Right knee pain 10/12/2020   Knee pain 01/12/2020   Postpartum anemia 01/17/2019   Normal postpartum course 01/17/2019   Chronic hypertension complicating or reason for care during pregnancy, third trimester 01/12/2019   Threatened preterm labor 12/25/2018   Threatened preterm labor, third trimester 12/23/2018   Preterm labor 12/23/2018   Depressive disorder 12/23/2018   Anxiety disorder 12/23/2018   Sicca syndrome (  [place]) 08/27/2018   Chronic hypertension affecting pregnancy 07/25/2018   Pregnant 07/01/2018   Maternal obesity affecting pregnancy, antepartum 06/11/2018   Allergy to shellfish 06/11/2018   Chest pain 04/04/2017   On continuous oral anticoagulation 04/04/2017   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[place])  04/04/2017   Nonintractable headache 04/04/2017   Altered thought processes 04/04/2017   Word finding difficulty 04/04/2017   Antiphospholipid antibody syndrome ([place]) 02/10/2017   Pulmonary embolus ([place]) 02/09/2017   Pulmonary embolism ([place]) 12/26/2016   Shortness of breath    History of pulmonary embolus ([facility]) 07/24/2016   Obesity-BMI 47 05/09/2013   Hirsutism 10/02/2012   STD (female) 01/18/2012   Lupus ([place])    Asthma    HSV infection     Allergies:  Allergies  Allergen Reactions   Bactrim Anaphylaxis and Hives   Hydrocodone-Acetaminophen Anaphylaxis   Nifedipine Swelling and Anaphylaxis    Swelling of tongue   Pineapple Itching   Prednisone Anaphylaxis and Hives    Has been on Dexamethasone without issue.   Shrimp Extract Allergy Skin Test Itching   Medications:  Current Outpatient Medications:    amoxicillin (AMOXIL) 875 MG tablet, Take 1 tablet (875 mg total) by mouth 2 (two) times daily for 10 days., Disp: 20 tablet, Rfl: 0   promethazine-dextromethorphan (PROMETHAZINE-DM) 6.25-15 MG/5ML syrup, Take 5 mLs by mouth 4 (four) times daily as needed., Disp: 118 mL, Rfl: 0   acetaminophen (TYLENOL) 325 MG tablet, Take 2 tablets (650 mg total) by mouth every 6 (six) hours as needed for headache or mild pain (for pain scale < 4  OR  temperature  >/=  100.5 F). (Patient not taking: Reported on 04/27/2022), Disp: 60 tablet, Rfl: 3   albuterol (PROVENTIL) (2.5 MG/3ML) 0.083% nebulizer solution, Take 3 mLs (2.5 mg total) by nebulization every 6 (six) hours as needed for wheezing or shortness of breath. (Patient not taking: Reported on 04/27/2022), Disp: 75 mL, Rfl: 0   albuterol (VENTOLIN HFA) 108 (90 Base) MCG/ACT inhaler, Inhale 2 puffs into the lungs every 6 (six) hours as needed for wheezing or shortness of breath., Disp: 18 g, Rfl: 0   buPROPion (WELLBUTRIN XL) 150 MG 24 hr tablet, Take 1 tablet (150 mg total) by mouth daily., Disp: 30 tablet, Rfl: 3   cholecalciferol (VITAMIN D3) 25  MCG (1000 UNIT) tablet, Take 2,000 Units by mouth daily., Disp: , Rfl:    clonazePAM (KLONOPIN) 0.5 MG tablet, Take 1 tablet (0.5 mg total) by mouth 2 (two) times daily as needed for anxiety., Disp: 30 tablet, Rfl: 1   EPINEPHrine 0.1 MG/0.1ML SOAJ, Inject 1 Dose as directed as needed (for anaphylaxis). (Patient not taking: Reported on 01/04/2022), Disp: 2 each, Rfl: 2   ibuprofen (ADVIL) 600 MG tablet, Take 1 tablet (600 mg total) by mouth every 6 (six) hours as needed for headache or cramping. (Patient not taking: Reported on 04/27/2022), Disp: 60 tablet, Rfl: 3   sertraline (ZOLOFT) 50 MG tablet, Take 0.5 tablets (25 mg total) by mouth daily for 8 days, THEN 1 tablet (50 mg total) daily. (Patient taking differently: Take 50 mg by mouth daily), Disp: 86 tablet, Rfl: 0  Observations/Objective: Patient is well-developed, well-nourished in no acute distress.  Resting comfortably at home.  Head is normocephalic, atraumatic.  No labored breathing.  Speech is clear and coherent with logical content.  Patient is alert and oriented at baseline.    Assessment and Plan: 1. Strep throat - amoxicillin (AMOXIL) 875 MG tablet; Take 1 tablet ([id number]  mg total) by mouth 2 (two) times daily for 10 days.  Dispense: 20 tablet; Refill: 0 - promethazine-dextromethorphan (PROMETHAZINE-DM) 6.25-15 MG/5ML syrup; Take 5 mLs by mouth 4 (four) times daily as needed.  Dispense: 118 mL; Refill: 0  - Suspect strep throat - Amoxicillin prescribed - Promethazine DM for cough - Tylenol and Ibuprofen alternating every 4 hours - Salt water gargles - Chloraseptic spray - Liquid and soft food diet - Push fluids - New toothbrush in 3 days - Seek in person evaluation if not improving or if symptoms worsen   Follow Up Instructions: I discussed the assessment and treatment plan with the patient. The patient was provided an opportunity to ask questions and all were answered. The patient agreed with the plan and demonstrated an  understanding of the instructions.  A copy of instructions were sent to the patient via MyChart unless otherwise noted below.    The patient was advised to call back or seek an in-person evaluation if the symptoms worsen or if the condition fails to improve as anticipated.  Time:  I spent 8 minutes with the patient via telehealth technology discussing the above problems/concerns.    Mar Daring, PA-C

## 2022-09-22 NOTE — Patient Instructions (Signed)
Arta Bruce, thank you for joining Mar Daring, PA-C for today's virtual visit.  While this provider is not your primary care provider (PCP), if your PCP is located in our provider database this encounter information will be shared with them immediately following your visit.   [facility] account gives you access to today's visit and all your visits, tests, and labs performed at [facility] " click here if you don't have a [facility] account or go to [email or web address]  Consent: (Patient) Carlett First provided verbal consent for this virtual visit at the beginning of the encounter.  Current Medications:  Current Outpatient Medications:    amoxicillin (AMOXIL) 875 MG tablet, Take 1 tablet (875 mg total) by mouth 2 (two) times daily for 10 days., Disp: 20 tablet, Rfl: 0   promethazine-dextromethorphan (PROMETHAZINE-DM) 6.25-15 MG/5ML syrup, Take 5 mLs by mouth 4 (four) times daily as needed., Disp: 118 mL, Rfl: 0   acetaminophen (TYLENOL) 325 MG tablet, Take 2 tablets (650 mg total) by mouth every 6 (six) hours as needed for headache or mild pain (for pain scale < 4  OR  temperature  >/=  100.5 F). (Patient not taking: Reported on 04/27/2022), Disp: 60 tablet, Rfl: 3   albuterol (PROVENTIL) (2.5 MG/3ML) 0.083% nebulizer solution, Take 3 mLs (2.5 mg total) by nebulization every 6 (six) hours as needed for wheezing or shortness of breath. (Patient not taking: Reported on 04/27/2022), Disp: 75 mL, Rfl: 0   albuterol (VENTOLIN HFA) 108 (90 Base) MCG/ACT inhaler, Inhale 2 puffs into the lungs every 6 (six) hours as needed for wheezing or shortness of breath., Disp: 18 g, Rfl: 0   buPROPion (WELLBUTRIN XL) 150 MG 24 hr tablet, Take 1 tablet (150 mg total) by mouth daily., Disp: 30 tablet, Rfl: 3   cholecalciferol (VITAMIN D3) 25 MCG (1000 UNIT) tablet, Take 2,000 Units by mouth daily., Disp: , Rfl:    clonazePAM (KLONOPIN) 0.5 MG tablet, Take 1 tablet (0.5  mg total) by mouth 2 (two) times daily as needed for anxiety., Disp: 30 tablet, Rfl: 1   EPINEPHrine 0.1 MG/0.1ML SOAJ, Inject 1 Dose as directed as needed (for anaphylaxis). (Patient not taking: Reported on 01/04/2022), Disp: 2 each, Rfl: 2   ibuprofen (ADVIL) 600 MG tablet, Take 1 tablet (600 mg total) by mouth every 6 (six) hours as needed for headache or cramping. (Patient not taking: Reported on 04/27/2022), Disp: 60 tablet, Rfl: 3   sertraline (ZOLOFT) 50 MG tablet, Take 0.5 tablets (25 mg total) by mouth daily for 8 days, THEN 1 tablet (50 mg total) daily. (Patient taking differently: Take 50 mg by mouth daily), Disp: 86 tablet, Rfl: 0   Medications ordered in this encounter:  Meds ordered this encounter  Medications   amoxicillin (AMOXIL) 875 MG tablet    Sig: Take 1 tablet (875 mg total) by mouth 2 (two) times daily for 10 days.    Dispense:  20 tablet    Refill:  0    Order Specific Question:   Supervising Provider    Answer:   Chase Picket [id number]   promethazine-dextromethorphan (PROMETHAZINE-DM) 6.25-15 MG/5ML syrup    Sig: Take 5 mLs by mouth 4 (four) times daily as needed.    Dispense:  118 mL    Refill:  0    Order Specific Question:   Supervising Provider    Answer:   Chase Picket [id number]     *If you need refills on other medications  prior to your next appointment, please contact your pharmacy*  Follow-Up: Call back or seek an in-person evaluation if the symptoms worsen or if the condition fails to improve as anticipated.  [facility] [phone]  Other Instructions  Strep Throat, Adult Strep throat is an infection in the throat that is caused by bacteria. It is common during the cold months of the year. It mostly affects children who are 42-51 years old. However, people of all ages can get it at any time of the year. This infection spreads from person to person (is contagious) through coughing, sneezing, or having close contact. Your health  care provider may use other names to describe the infection. When strep throat affects the tonsils, it is called tonsillitis. When it affects the back of the throat, it is called pharyngitis. What are the causes? This condition is caused by the Streptococcus pyogenes bacteria. What increases the risk? You are more likely to develop this condition if: You care for school-age children, or are around school-age children. Children are more likely to get strep throat and may spread it to others. You spend time in crowded places where the infection can spread easily. You have close contact with someone who has strep throat. What are the signs or symptoms? Symptoms of this condition include: Fever or chills. Redness, swelling, or pain in the tonsils or throat. Pain or difficulty when swallowing. White or yellow spots on the tonsils or throat. Tender glands in the neck and under the jaw. Bad smelling breath. Red rash all over the body. This is rare. How is this diagnosed? This condition is diagnosed by tests that check for the presence and the amount of bacteria that cause strep throat. They are: Rapid strep test. Your throat is swabbed and checked for the presence of bacteria. Results are usually ready in minutes. Throat culture test. Your throat is swabbed. The sample is placed in a cup that allows infections to grow. Results are usually ready in 1 or 2 days. How is this treated? This condition may be treated with: Medicines that kill germs (antibiotics). Medicines that relieve pain or fever. These include: Ibuprofen or acetaminophen. Aspirin, only for people who are over the age of 54. Throat lozenges. Throat sprays. Follow these instructions at home: Medicines  Take over-the-counter and prescription medicines only as told by your health care provider. Take your antibiotic medicine as told by your health care provider. Do not stop taking the antibiotic even if you start to feel  better. Eating and drinking  If you have trouble swallowing, try eating soft foods until your sore throat feels better. Drink enough fluid to keep your urine pale yellow. To help relieve pain, you may have: Warm fluids, such as soup and tea. Cold fluids, such as frozen desserts or popsicles. General instructions Gargle with a salt-water mixture 3-4 times a day or as needed. To make a salt-water mixture, completely dissolve -1 tsp (3-6 g) of salt in 1 cup (237 mL) of warm water. Get plenty of rest. Stay home from work or school until you have been taking antibiotics for 24 hours. Do not use any products that contain nicotine or tobacco. These products include cigarettes, chewing tobacco, and vaping devices, such as e-cigarettes. If you need help quitting, ask your health care provider. It is up to you to get your test results. Ask your health care provider, or the department that is doing the test, when your results will be ready. Keep all follow-up  visits. This is important. How is this prevented?  Do not share food, drinking cups, or personal items that could cause the infection to spread to other people. Wash your hands often with soap and water for at least 20 seconds. If soap and water are not available, use hand sanitizer. Make sure that all people in your house wash their hands well. Have family members tested if they have a sore throat or fever. They may need an antibiotic if they have strep throat. Contact a health care provider if: You have swelling in your neck that keeps getting bigger. You develop a rash, cough, or earache. You cough up a thick mucus that is green, yellow-brown, or bloody. You have pain or discomfort that does not get better with medicine. Your symptoms seem to be getting worse. You have a fever. Get help right away if: You have new symptoms, such as vomiting, severe headache, stiff or painful neck, chest pain, or shortness of breath. You have severe throat  pain, drooling, or changes in your voice. You have swelling of the neck, or the skin on the neck becomes red and tender. You have signs of dehydration, such as tiredness (fatigue), dry mouth, and decreased urination. You become increasingly sleepy, or you cannot wake up completely. Your joints become red or painful. These symptoms may represent a serious problem that is an emergency. Do not wait to see if the symptoms will go away. Get medical help right away. Call your local emergency services (911 in the [place] Do not drive yourself to the hospital. Summary Strep throat is an infection in the throat that is caused by the Streptococcus pyogenes bacteria. This infection is spread from person to person (is contagious) through coughing, sneezing, or having close contact. Take your medicines, including antibiotics, as told by your health care provider. Do not stop taking the antibiotic even if you start to feel better. To prevent the spread of germs, wash your hands well with soap and water. Have others do the same. Do not share food, drinking cups, or personal items. Get help right away if you have new symptoms, such as vomiting, severe headache, stiff or painful neck, chest pain, or shortness of breath. This information is not intended to replace advice given to you by your health care provider. Make sure you discuss any questions you have with your health care provider. Document Revised: 11/02/2020 Document Reviewed: 11/02/2020 Elsevier Patient Education  [place].    If you have been instructed to have an in-person evaluation today at a local Urgent Care facility, please use the link below. It will take you to a list of all of our available [facility], including address, phone number and hours of operation. Please do not delay care.  [facility]  If you or a family member do not have a primary care provider, use the link below to schedule a visit and  establish care. When you choose a [facility] primary care physician or advanced practice provider, you gain a long-term partner in health. Find a Primary Care Provider  Learn more about [facility] in-office and virtual care options: [facility]

## 2023-01-11 ENCOUNTER — Telehealth: Payer: Medicaid Other | Admitting: Physician Assistant

## 2023-01-11 DIAGNOSIS — J4531 Mild persistent asthma with (acute) exacerbation: Secondary | ICD-10-CM | POA: Diagnosis not present

## 2023-01-11 DIAGNOSIS — B9689 Other specified bacterial agents as the cause of diseases classified elsewhere: Secondary | ICD-10-CM | POA: Diagnosis not present

## 2023-01-11 DIAGNOSIS — J208 Acute bronchitis due to other specified organisms: Secondary | ICD-10-CM

## 2023-01-11 MED ORDER — ALBUTEROL SULFATE (2.5 MG/3ML) 0.083% IN NEBU: 2.5000 mg | INHALATION_SOLUTION | Freq: Four times a day (QID) | RESPIRATORY_TRACT | 0 refills | Status: DC | PRN

## 2023-01-11 MED ORDER — ALBUTEROL SULFATE HFA 108 (90 BASE) MCG/ACT IN AERS: 2.0000 | INHALATION_SPRAY | Freq: Four times a day (QID) | RESPIRATORY_TRACT | 0 refills | Status: DC | PRN

## 2023-01-11 MED ORDER — PROMETHAZINE-DM 6.25-15 MG/5ML PO SYRP: 5.0000 mL | ORAL_SOLUTION | Freq: Four times a day (QID) | ORAL | 0 refills | Status: DC | PRN

## 2023-01-11 MED ORDER — AZITHROMYCIN 250 MG PO TABS: ORAL_TABLET | ORAL | 0 refills | Status: AC

## 2023-01-11 NOTE — Progress Notes (Signed)
Virtual Visit Consent   Hayley Webb, you are scheduled for a virtual visit with a [facility] provider today. Just as with appointments in the office, your consent must be obtained to participate. Your consent will be active for this visit and any virtual visit you may have with one of our providers in the next 365 days. If you have a MyChart account, a copy of this consent can be sent to you electronically.  As this is a virtual visit, video technology does not allow for your provider to perform a traditional examination. This may limit your provider's ability to fully assess your condition. If your provider identifies any concerns that need to be evaluated in person or the need to arrange testing (such as labs, EKG, etc.), we will make arrangements to do so. Although advances in technology are sophisticated, we cannot ensure that it will always work on either your end or our end. If the connection with a video visit is poor, the visit may have to be switched to a telephone visit. With either a video or telephone visit, we are not always able to ensure that we have a secure connection.  By engaging in this virtual visit, you consent to the provision of healthcare and authorize for your insurance to be billed (if applicable) for the services provided during this visit. Depending on your insurance coverage, you may receive a charge related to this service.  I need to obtain your verbal consent now. Are you willing to proceed with your visit today? Hayley Webb has provided verbal consent on 01/11/2023 for a virtual visit (video or telephone). Margaretann Loveless, PA-C  Date: 01/11/2023 2:36 PM  Virtual Visit via Video Note   I, Margaretann Loveless, connected with  Hayley Webb  ([id number], 1985-11-01) on 01/11/23 at  2:30 PM EDT by a video-enabled telemedicine application and verified that I am speaking with the correct person using two identifiers.  Location: Patient: Virtual Visit Location  Patient: Home Provider: Virtual Visit Location Provider: Home Office   I discussed the limitations of evaluation and management by telemedicine and the availability of in person appointments. The patient expressed understanding and agreed to proceed.    History of Present Illness: Hayley Webb is a 37 y.o. who identifies as a female who was assigned female at birth, and is being seen today for cough and wheezing with asthma history and URI exposure.  HPI: Cough This is a new problem. The current episode started in the past 7 days. The problem has been gradually worsening. The problem occurs every few minutes. The cough is Productive of sputum and productive of purulent sputum. Associated symptoms include chest pain (tightness), chills (light), headaches, myalgias, nasal congestion (sinus pressure), shortness of breath (with laying flat) and wheezing. Pertinent negatives include no ear congestion, ear pain, fever, postnasal drip, rhinorrhea or sore throat. The symptoms are aggravated by lying down and pollens. She has tried a beta-agonist inhaler and body position changes (nyquil, dayquil) for the symptoms. Her past medical history is significant for asthma and bronchitis.     Problems:  Patient Active Problem List   Diagnosis Date Noted   Preeclampsia 01/25/2022   Left knee pain 10/20/2020   Right knee pain 10/12/2020   Knee pain 01/12/2020   Postpartum anemia 01/17/2019   Normal postpartum course 01/17/2019   Chronic hypertension complicating or reason for care during pregnancy, third trimester 01/12/2019   Threatened preterm labor 12/25/2018   Threatened preterm labor, third trimester 12/23/2018  Preterm labor 12/23/2018   Depressive disorder 12/23/2018   Anxiety disorder 12/23/2018   Sicca syndrome (HCC) 08/27/2018   Chronic hypertension affecting pregnancy 07/25/2018   Pregnant 07/01/2018   Maternal obesity affecting pregnancy, antepartum 06/11/2018   Allergy to shellfish  06/11/2018   Chest pain 04/04/2017   On continuous oral anticoagulation 04/04/2017   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   Nonintractable headache 04/04/2017   Altered thought processes 04/04/2017   Word finding difficulty 04/04/2017   Antiphospholipid antibody syndrome (HCC) 02/10/2017   Pulmonary embolus (HCC) 02/09/2017   Pulmonary embolism (HCC) 12/26/2016   Shortness of breath    History of pulmonary embolus (PE) 07/24/2016   Obesity-BMI 47 05/09/2013   Hirsutism 10/02/2012   STD (female) 01/18/2012   Lupus (HCC)    Asthma    HSV infection     Allergies:  Allergies  Allergen Reactions   Bactrim Anaphylaxis and Hives   Hydrocodone-Acetaminophen Anaphylaxis   Nifedipine Swelling and Anaphylaxis    Swelling of tongue   Pineapple Itching   Prednisone Anaphylaxis and Hives    Has been on Dexamethasone without issue.   Shrimp Extract Itching   Medications:  Current Outpatient Medications:    azithromycin (ZITHROMAX) 250 MG tablet, Take 2 tablets on day 1, then 1 tablet daily on days 2 through 5, Disp: 6 tablet, Rfl: 0   acetaminophen (TYLENOL) 325 MG tablet, Take 2 tablets (650 mg total) by mouth every 6 (six) hours as needed for headache or mild pain (for pain scale < 4  OR  temperature  >/=  100.5 F). (Patient not taking: Reported on 04/27/2022), Disp: 60 tablet, Rfl: 3   albuterol (PROVENTIL) (2.5 MG/3ML) 0.083% nebulizer solution, Take 3 mLs (2.5 mg total) by nebulization every 6 (six) hours as needed for wheezing or shortness of breath., Disp: 75 mL, Rfl: 0   albuterol (VENTOLIN HFA) 108 (90 Base) MCG/ACT inhaler, Inhale 2 puffs into the lungs every 6 (six) hours as needed for wheezing or shortness of breath., Disp: 18 g, Rfl: 0   buPROPion (WELLBUTRIN XL) 150 MG 24 hr tablet, Take 1 tablet (150 mg total) by mouth daily., Disp: 30 tablet, Rfl: 3   cholecalciferol (VITAMIN D3) 25 MCG (1000 UNIT) tablet, Take 2,000 Units by mouth daily., Disp: , Rfl:    clonazePAM  (KLONOPIN) 0.5 MG tablet, Take 1 tablet (0.5 mg total) by mouth 2 (two) times daily as needed for anxiety., Disp: 30 tablet, Rfl: 1   EPINEPHrine 0.1 MG/0.1ML SOAJ, Inject 1 Dose as directed as needed (for anaphylaxis). (Patient not taking: Reported on 01/04/2022), Disp: 2 each, Rfl: 2   promethazine-dextromethorphan (PROMETHAZINE-DM) 6.25-15 MG/5ML syrup, Take 5 mLs by mouth 4 (four) times daily as needed., Disp: 118 mL, Rfl: 0   sertraline (ZOLOFT) 50 MG tablet, Take 0.5 tablets (25 mg total) by mouth daily for 8 days, THEN 1 tablet (50 mg total) daily. (Patient taking differently: Take 50 mg by mouth daily), Disp: 86 tablet, Rfl: 0  Observations/Objective: Patient is well-developed, well-nourished in no acute distress.  Resting comfortably at home.  Head is normocephalic, atraumatic.  No labored breathing.  Speech is clear and coherent with logical content.  Patient is alert and oriented at baseline.    Assessment and Plan: 1. Acute bacterial bronchitis - albuterol (VENTOLIN HFA) 108 (90 Base) MCG/ACT inhaler; Inhale 2 puffs into the lungs every 6 (six) hours as needed for wheezing or shortness of breath.  Dispense: 18 g; Refill: 0 - promethazine-dextromethorphan (PROMETHAZINE-DM) 6.25-15  MG/5ML syrup; Take 5 mLs by mouth 4 (four) times daily as needed.  Dispense: 118 mL; Refill: 0 - azithromycin (ZITHROMAX) 250 MG tablet; Take 2 tablets on day 1, then 1 tablet daily on days 2 through 5  Dispense: 6 tablet; Refill: 0 - albuterol (PROVENTIL) (2.5 MG/3ML) 0.083% nebulizer solution; Take 3 mLs (2.5 mg total) by nebulization every 6 (six) hours as needed for wheezing or shortness of breath.  Dispense: 75 mL; Refill: 0  2. Mild persistent asthma with acute exacerbation  - Worsening over a week despite OTC medications - Will treat with Z-pack, albuterol (inhaler and nebulizer), and Promethazine DM - Can continue Mucinex  - Push fluids.  - Rest.  - Steam and humidifier can help - Seek in  person evaluation if worsening or symptoms fail to improve    Follow Up Instructions: I discussed the assessment and treatment plan with the patient. The patient was provided an opportunity to ask questions and all were answered. The patient agreed with the plan and demonstrated an understanding of the instructions.  A copy of instructions were sent to the patient via MyChart unless otherwise noted below.   The patient was advised to call back or seek an in-person evaluation if the symptoms worsen or if the condition fails to improve as anticipated.  Time:  I spent 10 minutes with the patient via telehealth technology discussing the above problems/concerns.    Margaretann Loveless, PA-C

## 2023-01-11 NOTE — Patient Instructions (Signed)
Mindi Curling, thank you for joining Margaretann Loveless, PA-C for today's virtual visit.  While this provider is not your primary care provider (PCP), if your PCP is located in our provider database this encounter information will be shared with them immediately following your visit.   A [facility] MyChart account gives you access to today's visit and all your visits, tests, and labs performed at [facility] " click here if you don't have a [facility] MyChart account or go to [email or web address]  Consent: (Patient) Hayley Webb provided verbal consent for this virtual visit at the beginning of the encounter.  Current Medications:  Current Outpatient Medications:    azithromycin (ZITHROMAX) 250 MG tablet, Take 2 tablets on day 1, then 1 tablet daily on days 2 through 5, Disp: 6 tablet, Rfl: 0   acetaminophen (TYLENOL) 325 MG tablet, Take 2 tablets (650 mg total) by mouth every 6 (six) hours as needed for headache or mild pain (for pain scale < 4  OR  temperature  >/=  100.5 F). (Patient not taking: Reported on 04/27/2022), Disp: 60 tablet, Rfl: 3   albuterol (PROVENTIL) (2.5 MG/3ML) 0.083% nebulizer solution, Take 3 mLs (2.5 mg total) by nebulization every 6 (six) hours as needed for wheezing or shortness of breath., Disp: 75 mL, Rfl: 0   albuterol (VENTOLIN HFA) 108 (90 Base) MCG/ACT inhaler, Inhale 2 puffs into the lungs every 6 (six) hours as needed for wheezing or shortness of breath., Disp: 18 g, Rfl: 0   buPROPion (WELLBUTRIN XL) 150 MG 24 hr tablet, Take 1 tablet (150 mg total) by mouth daily., Disp: 30 tablet, Rfl: 3   cholecalciferol (VITAMIN D3) 25 MCG (1000 UNIT) tablet, Take 2,000 Units by mouth daily., Disp: , Rfl:    clonazePAM (KLONOPIN) 0.5 MG tablet, Take 1 tablet (0.5 mg total) by mouth 2 (two) times daily as needed for anxiety., Disp: 30 tablet, Rfl: 1   EPINEPHrine 0.1 MG/0.1ML SOAJ, Inject 1 Dose as directed as needed (for anaphylaxis). (Patient not  taking: Reported on 01/04/2022), Disp: 2 each, Rfl: 2   promethazine-dextromethorphan (PROMETHAZINE-DM) 6.25-15 MG/5ML syrup, Take 5 mLs by mouth 4 (four) times daily as needed., Disp: 118 mL, Rfl: 0   sertraline (ZOLOFT) 50 MG tablet, Take 0.5 tablets (25 mg total) by mouth daily for 8 days, THEN 1 tablet (50 mg total) daily. (Patient taking differently: Take 50 mg by mouth daily), Disp: 86 tablet, Rfl: 0   Medications ordered in this encounter:  Meds ordered this encounter  Medications   albuterol (VENTOLIN HFA) 108 (90 Base) MCG/ACT inhaler    Sig: Inhale 2 puffs into the lungs every 6 (six) hours as needed for wheezing or shortness of breath.    Dispense:  18 g    Refill:  0    Order Specific Question:   Supervising Provider    Answer:   Merrilee Jansky [id number]   promethazine-dextromethorphan (PROMETHAZINE-DM) 6.25-15 MG/5ML syrup    Sig: Take 5 mLs by mouth 4 (four) times daily as needed.    Dispense:  118 mL    Refill:  0    Order Specific Question:   Supervising Provider    Answer:   Merrilee Jansky [[id number]]   azithromycin (ZITHROMAX) 250 MG tablet    Sig: Take 2 tablets on day 1, then 1 tablet daily on days 2 through 5    Dispense:  6 tablet    Refill:  0    Order Specific Question:  Supervising Provider    Answer:   Merrilee Jansky [[id number]]   albuterol (PROVENTIL) (2.5 MG/3ML) 0.083% nebulizer solution    Sig: Take 3 mLs (2.5 mg total) by nebulization every 6 (six) hours as needed for wheezing or shortness of breath.    Dispense:  75 mL    Refill:  0    Order Specific Question:   Supervising Provider    Answer:   Merrilee Jansky [[id number]]     *If you need refills on other medications prior to your next appointment, please contact your pharmacy*  Follow-Up: Call back or seek an in-person evaluation if the symptoms worsen or if the condition fails to improve as anticipated.  [facility] [phone]  Other Instructions Acute Bronchitis,  Adult  Acute bronchitis is sudden inflammation of the main airways (bronchi) that come off the windpipe (trachea) in the lungs. The swelling causes the airways to get smaller and make more mucus than normal. This can make it hard to breathe and can cause coughing or noisy breathing (wheezing). Acute bronchitis may last several weeks. The cough may last longer. Allergies, asthma, and exposure to smoke may make the condition worse. What are the causes? This condition can be caused by germs and by substances that irritate the lungs, including: Cold and flu viruses. The most common cause of this condition is the virus that causes the common cold. Bacteria. This is less common. Breathing in substances that irritate the lungs, including: Smoke from cigarettes and other forms of tobacco. Dust and pollen. Fumes from household cleaning products, gases, or burned fuel. Indoor or outdoor air pollution. What increases the risk? The following factors may make you more likely to develop this condition: A weak body's defense system, also called the immune system. A condition that affects your lungs and breathing, such as asthma. What are the signs or symptoms? Common symptoms of this condition include: Coughing. This may bring up clear, yellow, or green mucus from your lungs (sputum). Wheezing. Runny or stuffy nose. Having too much mucus in your lungs (chest congestion). Shortness of breath. Aches and pains, including sore throat or chest. How is this diagnosed? This condition is usually diagnosed based on: Your symptoms and medical history. A physical exam. You may also have other tests, including tests to rule out other conditions, such as pneumonia. These tests include: A test of lung function. Test of a mucus sample to look for the presence of bacteria. Tests to check the oxygen level in your blood. Blood tests. Chest X-ray. How is this treated? Most cases of acute bronchitis clear up over  time without treatment. Your health care provider may recommend: Drinking more fluids to help thin your mucus so it is easier to cough up. Taking inhaled medicine (inhaler) to improve air flow in and out of your lungs. Using a vaporizer or a humidifier. These are machines that add water to the air to help you breathe better. Taking a medicine that thins mucus and clears congestion (expectorant). Taking a medicine that prevents or stops coughing (cough suppressant). It is not common to take an antibiotic medicine for this condition. Follow these instructions at home:  Take over-the-counter and prescription medicines only as told by your health care provider. Use an inhaler, vaporizer, or humidifier as told by your health care provider. Take two teaspoons (10 mL) of honey at bedtime to lessen coughing at night. Drink enough fluid to keep your urine pale yellow. Do not use any products  that contain nicotine or tobacco. These products include cigarettes, chewing tobacco, and vaping devices, such as e-cigarettes. If you need help quitting, ask your health care provider. Get plenty of rest. Return to your normal activities as told by your health care provider. Ask your health care provider what activities are safe for you. Keep all follow-up visits. This is important. How is this prevented? To lower your risk of getting this condition again: Wash your hands often with soap and water for at least 20 seconds. If soap and water are not available, use hand sanitizer. Avoid contact with people who have cold symptoms. Try not to touch your mouth, nose, or eyes with your hands. Avoid breathing in smoke or chemical fumes. Breathing smoke or chemical fumes will make your condition worse. Get the flu shot every year. Contact a health care provider if: Your symptoms do not improve after 2 weeks. You have trouble coughing up the mucus. Your cough keeps you awake at night. You have a fever. Get help right  away if you: Cough up blood. Feel pain in your chest. Have severe shortness of breath. Faint or keep feeling like you are going to faint. Have a severe headache. Have a fever or chills that get worse. These symptoms may represent a serious problem that is an emergency. Do not wait to see if the symptoms will go away. Get medical help right away. Call your local emergency services (911 in the [place] Do not drive yourself to the hospital. Summary Acute bronchitis is inflammation of the main airways (bronchi) that come off the windpipe (trachea) in the lungs. The swelling causes the airways to get smaller and make more mucus than normal. Drinking more fluids can help thin your mucus so it is easier to cough up. Take over-the-counter and prescription medicines only as told by your health care provider. Do not use any products that contain nicotine or tobacco. These products include cigarettes, chewing tobacco, and vaping devices, such as e-cigarettes. If you need help quitting, ask your health care provider. Contact a health care provider if your symptoms do not improve after 2 weeks. This information is not intended to replace advice given to you by your health care provider. Make sure you discuss any questions you have with your health care provider. Document Revised: 10/20/2021 Document Reviewed: 11/10/2020 Elsevier Patient Education  2024 [employer]    If you have been instructed to have an in-person evaluation today at a local Urgent Care facility, please use the link below. It will take you to a list of all of our available [facility], including address, phone number and hours of operation. Please do not delay care.  [facility]  If you or a family member do not have a primary care provider, use the link below to schedule a visit and establish care. When you choose a [facility] primary care physician or advanced practice provider, you gain a long-term partner  in health. Find a Primary Care Provider  Learn more about [facility] in-office and virtual care options: [facility] - Get Care Now

## 2023-02-15 LAB — OB RESULTS CONSOLE GC/CHLAMYDIA
Chlamydia: NEGATIVE
Neisseria Gonorrhea: NEGATIVE

## 2023-02-15 LAB — OB RESULTS CONSOLE RPR: RPR: NONREACTIVE

## 2023-02-15 LAB — OB RESULTS CONSOLE HIV ANTIBODY (ROUTINE TESTING): HIV: NONREACTIVE

## 2023-02-15 LAB — OB RESULTS CONSOLE HEPATITIS B SURFACE ANTIGEN: Hepatitis B Surface Ag: NEGATIVE

## 2023-02-15 LAB — OB RESULTS CONSOLE RUBELLA ANTIBODY, IGM: Rubella: IMMUNE

## 2023-02-19 ENCOUNTER — Other Ambulatory Visit (HOSPITAL_COMMUNITY): Payer: Self-pay

## 2023-02-19 ENCOUNTER — Inpatient Hospital Stay (HOSPITAL_COMMUNITY): Payer: Medicaid Other

## 2023-02-19 ENCOUNTER — Encounter (HOSPITAL_COMMUNITY): Payer: Self-pay | Admitting: *Deleted

## 2023-02-19 ENCOUNTER — Inpatient Hospital Stay (HOSPITAL_COMMUNITY)
Admission: AD | Admit: 2023-02-19 | Discharge: 2023-02-19 | Disposition: A | Discharge status: Home / Self Care | Payer: Medicaid Other | Attending: Obstetrics and Gynecology | Admitting: Obstetrics and Gynecology

## 2023-02-19 DIAGNOSIS — Z3A12 12 weeks gestation of pregnancy: Secondary | ICD-10-CM | POA: Diagnosis not present

## 2023-02-19 DIAGNOSIS — Z86711 Personal history of pulmonary embolism: Secondary | ICD-10-CM

## 2023-02-19 DIAGNOSIS — Z79899 Other long term (current) drug therapy: Secondary | ICD-10-CM | POA: Insufficient documentation

## 2023-02-19 DIAGNOSIS — R0602 Shortness of breath: Secondary | ICD-10-CM

## 2023-02-19 DIAGNOSIS — L680 Hirsutism: Secondary | ICD-10-CM

## 2023-02-19 DIAGNOSIS — F419 Anxiety disorder, unspecified: Secondary | ICD-10-CM

## 2023-02-19 DIAGNOSIS — F53 Postpartum depression: Secondary | ICD-10-CM

## 2023-02-19 DIAGNOSIS — M329 Systemic lupus erythematosus, unspecified: Secondary | ICD-10-CM

## 2023-02-19 DIAGNOSIS — IMO0002 Reserved for concepts with insufficient information to code with codable children: Secondary | ICD-10-CM

## 2023-02-19 DIAGNOSIS — R079 Chest pain, unspecified: Secondary | ICD-10-CM

## 2023-02-19 DIAGNOSIS — D6861 Antiphospholipid syndrome: Secondary | ICD-10-CM

## 2023-02-19 DIAGNOSIS — Z7901 Long term (current) use of anticoagulants: Secondary | ICD-10-CM

## 2023-02-19 DIAGNOSIS — B009 Herpesviral infection, unspecified: Secondary | ICD-10-CM

## 2023-02-19 DIAGNOSIS — Z8616 Personal history of COVID-19: Secondary | ICD-10-CM | POA: Insufficient documentation

## 2023-02-19 DIAGNOSIS — Z91013 Allergy to seafood: Secondary | ICD-10-CM

## 2023-02-19 DIAGNOSIS — O26891 Other specified pregnancy related conditions, first trimester: Secondary | ICD-10-CM | POA: Diagnosis not present

## 2023-02-19 DIAGNOSIS — O4703 False labor before 37 completed weeks of gestation, third trimester: Secondary | ICD-10-CM

## 2023-02-19 DIAGNOSIS — R4789 Other speech disturbances: Secondary | ICD-10-CM

## 2023-02-19 DIAGNOSIS — O10919 Unspecified pre-existing hypertension complicating pregnancy, unspecified trimester: Secondary | ICD-10-CM

## 2023-02-19 DIAGNOSIS — F32A Depression, unspecified: Secondary | ICD-10-CM

## 2023-02-19 DIAGNOSIS — O10913 Unspecified pre-existing hypertension complicating pregnancy, third trimester: Secondary | ICD-10-CM

## 2023-02-19 DIAGNOSIS — A64 Unspecified sexually transmitted disease: Secondary | ICD-10-CM

## 2023-02-19 DIAGNOSIS — R4189 Other symptoms and signs involving cognitive functions and awareness: Secondary | ICD-10-CM

## 2023-02-19 DIAGNOSIS — O9081 Anemia of the puerperium: Secondary | ICD-10-CM

## 2023-02-19 DIAGNOSIS — M3500 Sicca syndrome, unspecified: Secondary | ICD-10-CM

## 2023-02-19 DIAGNOSIS — I2699 Other pulmonary embolism without acute cor pulmonale: Secondary | ICD-10-CM

## 2023-02-19 LAB — CBC WITH DIFFERENTIAL/PLATELET
Abs Immature Granulocytes: 0.01 10*3/uL (ref 0.00–0.07)
Basophils Absolute: 0 10*3/uL (ref 0.0–0.1)
Basophils Relative: 1 %
Eosinophils Absolute: 0.2 10*3/uL (ref 0.0–0.5)
Eosinophils Relative: 3 %
HCT: 33.4 % — ABNORMAL LOW (ref 36.0–46.0)
Hemoglobin: 10.4 g/dL — ABNORMAL LOW (ref 12.0–15.0)
Immature Granulocytes: 0 %
Lymphocytes Relative: 44 %
Lymphs Abs: 2.3 10*3/uL (ref 0.7–4.0)
MCH: 24 pg — ABNORMAL LOW (ref 26.0–34.0)
MCHC: 31.1 g/dL (ref 30.0–36.0)
MCV: 77 fL — ABNORMAL LOW (ref 80.0–100.0)
Monocytes Absolute: 0.3 10*3/uL (ref 0.1–1.0)
Monocytes Relative: 6 %
Neutro Abs: 2.4 10*3/uL (ref 1.7–7.7)
Neutrophils Relative %: 46 %
Platelets: 374 10*3/uL (ref 150–400)
RBC: 4.34 MIL/uL (ref 3.87–5.11)
RDW: 16.6 % — ABNORMAL HIGH (ref 11.5–15.5)
WBC: 5.2 10*3/uL (ref 4.0–10.5)
nRBC: 0 % (ref 0.0–0.2)

## 2023-02-19 LAB — TROPONIN I (HIGH SENSITIVITY): Troponin I (High Sensitivity): 4 ng/L (ref <18)

## 2023-02-19 LAB — BRAIN NATRIURETIC PEPTIDE: B Natriuretic Peptide: 12.6 pg/mL (ref 0.0–100.0)

## 2023-02-19 MED ORDER — LACTATED RINGERS IV SOLN: INTRAVENOUS | Status: DC

## 2023-02-19 MED ORDER — IOHEXOL 350 MG/ML SOLN
160.0000 mL | Freq: Once | INTRAVENOUS | Status: AC | PRN
  Administered 2023-02-19: 160 mL via INTRAVENOUS

## 2023-02-19 MED ORDER — SCOPOLAMINE 1 MG/3DAYS TD PT72
1.0000 | MEDICATED_PATCH | Freq: Once | TRANSDERMAL | Status: DC
  Administered 2023-02-19: 1.5 mg via TRANSDERMAL
  Filled 2023-02-19: qty 1

## 2023-02-19 MED ORDER — ENOXAPARIN SODIUM 150 MG/ML IJ SOSY
150.0000 mg | PREFILLED_SYRINGE | Freq: Two times a day (BID) | INTRAMUSCULAR | 1 refills | Status: DC
  Filled 2023-02-19 – 2023-03-20 (×2): qty 30, 15d supply, fill #0

## 2023-02-19 MED ORDER — CYCLOBENZAPRINE HCL 10 MG PO TABS
10.0000 mg | ORAL_TABLET | Freq: Three times a day (TID) | ORAL | 1 refills | Status: DC | PRN
  Filled 2023-02-19: qty 30, 10d supply, fill #0

## 2023-02-19 MED ORDER — SERTRALINE HCL 50 MG PO TABS: ORAL_TABLET | ORAL | Status: DC

## 2023-02-19 MED ORDER — CYCLOBENZAPRINE HCL 5 MG PO TABS
10.0000 mg | ORAL_TABLET | Freq: Once | ORAL | Status: AC
  Administered 2023-02-19: 10 mg via ORAL
  Filled 2023-02-19: qty 2

## 2023-02-19 NOTE — MAU Note (Signed)
Patient returned from CT

## 2023-02-19 NOTE — MAU Note (Signed)
Pt transport at bedside to take patient to CT.

## 2023-02-19 NOTE — MAU Note (Signed)
Hayley Webb is a 37 y.o. at [redacted]w[redacted]d here in MAU reporting: sent by office.  Had dr's appt to check BP, it was elevated. Has been having slight chest pain and HA, so they sent her here. Has CHTN- they just started her on Labetalol 100mg  BID last Thursday.  Has not taken anything for the pain. Onset of complaint: Sat (HA and chest pain) Pain score: HA 5, chest  now a 6-7, became worse while in triage.  Lab orders placed from triage:   States chest pain getting worse now- feels like it did when she had her pulmonary embolus  Pt taken directly to rm, APP notified

## 2023-02-19 NOTE — Progress Notes (Signed)
[name] called to patient bedside, patient feeling pressure to her chest and SOB like something "sitting on her chest." EKG being performed, [name] assessed vitals and ordered STAT Chest x-ray and CT.   Patient Vitals for the past 24 hrs:  BP Temp Temp src Pulse Resp SpO2 Height Weight  02/19/23 1431 (!) 152/92 98.2 F (36.8 C) Oral 89 20 99 % 5' 8.5" (1.74 m) (!) 150 kg   Hayley Webb (Hayley Webb) Hayley Portela, MSN, [name]  [facility]  02/19/2023 2:47 PM

## 2023-02-19 NOTE — MAU Note (Signed)
Had stepped out of room, as translator was available for d/c.   Hayley Webb calm and smiling when called back to triage.  Upon return (5-18min), she was leaned over, with a concerned look on face.  Pt states she is having sharp pain in left upper chest, radiating down arm.  Feeling pressure. Feels like when she had pulmonary embolus.Marland Kitchen  vs checked and pt transferred to rm via wc.  APP called to rm, NT called to room to do EKG.

## 2023-02-19 NOTE — MAU Provider Note (Cosign Needed Addendum)
History     CSN: [id number]  Arrival date and time: 02/19/23 1349   Event Date/Time   First Provider Initiated Contact with Patient 02/19/23 1445      No chief complaint on file.  Hayley Webb , a  37 y.o. [phone] at [redacted]w[redacted]d presents to MAU with complaints of SOB and chest pain. Patient was initially sent over from [facility] for mild chest pain with elevated BPs in the office. Upon arrival to MAU patient reports chest pain worsened and now radiating down her left arm. She states that she also feels SOB and light headed. She states this new pain onset was sudden and worse. She currently rates pain a 8/10. She states she feel like "when she had her PE in 2018." She reports being on Lovenox 150mg  BID, but has not taken it in 2 weeks due to short supply at the pharmacy. Patient denies vaginal bleeding, leaking of fluids or abnormal vaginal discharge.   - Patient recently started on Labetalol.          OB History     Gravida  6   Para  3   Term  1   Preterm  2   AB  2   Living  3      SAB  2   IAB      Ectopic      Multiple  0   Live Births  3           Past Medical History:  Diagnosis Date   Anxiety    doing good now   Asthma    COVID 07/2021   mild   Depression    doing good   Headache(784.0)    HSV infection    Hypertension    was taken off meds after last preg   Infection    UTI   Lupus [facility])    dx age 38   Pneumonia    blood clot appeared during the pneumonia   Pulmonary embolism (HCC) 06/4/82011   STD (sexually transmitted disease) 02/2009   POSITIVE GC   UTI (urinary tract infection)     Past Surgical History:  Procedure Laterality Date   ADENOIDECTOMY     TONSILLECTOMY AND ADENOIDECTOMY  1998   WISDOM TOOTH EXTRACTION      Family History  Problem Relation Age of Onset   Asthma Mother    Healthy Father    Asthma Brother    Diabetes Maternal Aunt    Lupus Paternal Aunt    Stroke Paternal Aunt    Cancer Maternal Grandmother 72        COLON CA   Diabetes Maternal Grandmother    Hypertension Maternal Grandfather    Cancer Maternal Grandfather        prostate   Cancer Paternal Grandmother        breast   Lupus Paternal Grandmother    Anesthesia problems Neg Hx    Hypotension Neg Hx    Malignant hyperthermia Neg Hx    Pseudochol deficiency Neg Hx     Social History   Tobacco Use   Smoking status: Never   Smokeless tobacco: Never  Vaping Use   Vaping status: Never Used  Substance Use Topics   Alcohol use: Not Currently    Comment: occsinally   Drug use: No    Allergies:  Allergies  Allergen Reactions   Bactrim Anaphylaxis and Hives   Hydrocodone-Acetaminophen Anaphylaxis   Nifedipine Swelling and Anaphylaxis    Swelling  of tongue   Pineapple Itching   Prednisone Anaphylaxis and Hives    Has been on Dexamethasone without issue.   Shrimp Extract Itching    Medications Prior to Admission  Medication Sig Dispense Refill Last Dose   enoxaparin (LOVENOX) 150 MG/ML injection Inject 150 mg into the skin.   Past Month   hydroxychloroquine (PLAQUENIL) 200 MG tablet Take by mouth daily.   02/19/2023   labetalol (NORMODYNE) 100 MG tablet Take 100 mg by mouth 2 (two) times daily.   02/19/2023   acetaminophen (TYLENOL) 325 MG tablet Take 2 tablets (650 mg total) by mouth every 6 (six) hours as needed for headache or mild pain (for pain scale < 4  OR  temperature  >/=  100.5 F). (Patient not taking: Reported on 04/27/2022) 60 tablet 3    albuterol (PROVENTIL) (2.5 MG/3ML) 0.083% nebulizer solution Take 3 mLs (2.5 mg total) by nebulization every 6 (six) hours as needed for wheezing or shortness of breath. 75 mL 0    albuterol (VENTOLIN HFA) 108 (90 Base) MCG/ACT inhaler Inhale 2 puffs into the lungs every 6 (six) hours as needed for wheezing or shortness of breath. 18 g 0    buPROPion (WELLBUTRIN XL) 150 MG 24 hr tablet Take 1 tablet (150 mg total) by mouth daily. 30 tablet 3    cholecalciferol (VITAMIN D3) 25 MCG  (1000 UNIT) tablet Take 2,000 Units by mouth daily.      clonazePAM (KLONOPIN) 0.5 MG tablet Take 1 tablet (0.5 mg total) by mouth 2 (two) times daily as needed for anxiety. 30 tablet 1    EPINEPHrine 0.1 MG/0.1ML SOAJ Inject 1 Dose as directed as needed (for anaphylaxis). (Patient not taking: Reported on 01/04/2022) 2 each 2    promethazine-dextromethorphan (PROMETHAZINE-DM) 6.25-15 MG/5ML syrup Take 5 mLs by mouth 4 (four) times daily as needed. 118 mL 0    sertraline (ZOLOFT) 50 MG tablet Take 0.5 tablets (25 mg total) by mouth daily for 8 days, THEN 1 tablet (50 mg total) daily. (Patient taking differently: Take 50 mg by mouth daily) 86 tablet 0     Review of Systems  Constitutional:  Positive for diaphoresis. Negative for chills, fatigue and fever.  Eyes:  Negative for pain and visual disturbance.  Respiratory:  Positive for chest tightness and shortness of breath. Negative for apnea and wheezing.   Cardiovascular:  Positive for palpitations. Negative for chest pain.  Gastrointestinal:  Negative for abdominal pain, constipation, diarrhea, nausea and vomiting.  Genitourinary:  Negative for difficulty urinating, dysuria, pelvic pain, vaginal bleeding, vaginal discharge and vaginal pain.  Musculoskeletal:  Negative for back pain.  Neurological:  Positive for dizziness, weakness and light-headedness. Negative for seizures and headaches.  Psychiatric/Behavioral:  Negative for suicidal ideas.    Physical Exam   Blood pressure (!) 148/81, pulse 78, temperature 98.2 F (36.8 C), temperature source Oral, resp. rate 20, height 5' 8.5" (1.74 m), weight (!) 150 kg, last menstrual period 05/04/2021, SpO2 99%, currently breastfeeding.  Physical Exam Vitals and nursing note reviewed.  Constitutional:      General: She is in acute distress.     Appearance: Normal appearance. She is ill-appearing and diaphoretic.  HENT:     Head: Normocephalic.  Cardiovascular:     Rate and Rhythm: Tachycardia  present.  Pulmonary:     Breath sounds: Normal breath sounds.     Comments: Labored breathing, that began to subside as she talked.  Musculoskeletal:     Cervical back: Normal  range of motion.  Skin:    General: Skin is warm.  Neurological:     Mental Status: She is alert and oriented to person, place, and time.  Psychiatric:        Mood and Affect: Mood normal.     MAU Course  Procedures Orders Placed This Encounter  Procedures   CT Angio Chest Pulmonary Embolism (PE) W or WO Contrast   CBC with Differential/Platelet   Brain natriuretic peptide   ED EKG   EKG 12-Lead   Meds ordered this encounter  Medications   lactated ringers infusion   Results for orders placed or performed during the hospital encounter of 02/19/23 (from the past 24 hour(s))  CBC with Differential/Platelet     Status: Abnormal   Collection Time: 02/19/23  2:40 PM  Result Value Ref Range   WBC 5.2 4.0 - 10.5 K/uL   RBC 4.34 3.87 - 5.11 MIL/uL   Hemoglobin 10.4 (L) 12.0 - 15.0 g/dL   HCT 16.1 (L) 09.6 - 04.5 %   MCV 77.0 (L) 80.0 - 100.0 fL   MCH 24.0 (L) 26.0 - 34.0 pg   MCHC 31.1 30.0 - 36.0 g/dL   RDW 40.9 (H) 81.1 - 91.4 %   Platelets 374 150 - 400 K/uL   nRBC 0.0 0.0 - 0.2 %   Neutrophils Relative % 46 %   Neutro Abs 2.4 1.7 - 7.7 K/uL   Lymphocytes Relative 44 %   Lymphs Abs 2.3 0.7 - 4.0 K/uL   Monocytes Relative 6 %   Monocytes Absolute 0.3 0.1 - 1.0 K/uL   Eosinophils Relative 3 %   Eosinophils Absolute 0.2 0.0 - 0.5 K/uL   Basophils Relative 1 %   Basophils Absolute 0.0 0.0 - 0.1 K/uL   Immature Granulocytes 0 %   Abs Immature Granulocytes 0.01 0.00 - 0.07 K/uL  Brain natriuretic peptide     Status: None   Collection Time: 02/19/23  2:40 PM  Result Value Ref Range   B Natriuretic Peptide 12.6 0.0 - 100.0 pg/mL  Troponin I (High Sensitivity)     Status: None   Collection Time: 02/19/23  2:40 PM  Result Value Ref Range   Troponin I (High Sensitivity) 4 <18 ng/L   CT Angio  Chest Pulmonary Embolism (PE) W or WO Contrast  Result Date: 02/19/2023 CLINICAL DATA:  Pulmonary embolism (PE) suspected, pregnant EXAM: CT ANGIOGRAPHY CHEST WITH CONTRAST TECHNIQUE: Multidetector CT imaging of the chest was performed using the standard protocol during bolus administration of intravenous contrast. Multiplanar CT image reconstructions and MIPs were obtained to evaluate the vascular anatomy. RADIATION DOSE REDUCTION: This exam was performed according to the departmental dose-optimization program which includes automated exposure control, adjustment of the mA and/or kV according to patient size and/or use of iterative reconstruction technique. CONTRAST:  OMNIPAQUE IOHEXOL 350 MG/ML SOLN COMPARISON:  None Available. FINDINGS: Cardiovascular: Evaluation of pulmonary embolism beyond the segmental branches is limited due to suboptimal contrast-enhancement and streak artifacts from contrast within the superior vena cava. There is no embolism to the segmental pulmonary artery level. Please note, 2 attempts were made with 2 different boluses of intravenous contrast, as first attempt was nondiagnostic due to markedly suboptimal contrast enhancement of the pulmonary arteries. Normal cardiac size. No pericardial effusion. No aortic aneurysm. Mediastinum/Nodes: Visualized thyroid gland appears grossly unremarkable. No solid / cystic mediastinal masses. The esophagus is nondistended precluding optimal assessment. No axillary, mediastinal or hilar lymphadenopathy by size criteria. Lungs/Pleura: The central  tracheo-bronchial tree is patent. There are dependent changes in bilateral lungs. No mass or consolidation. No pleural effusion or pneumothorax. No suspicious lung nodules. Upper Abdomen: Visualized upper abdominal viscera within normal limits. Musculoskeletal: The visualized soft tissues of the chest wall are grossly unremarkable. No suspicious osseous lesions. Review of the MIP images confirms the  above findings. IMPRESSION: *No pulmonary embolism to the segmental pulmonary artery level. Evaluation beyond this level is limited due to suboptimal contrast enhancement. *Otherwise unremarkable exam. Electronically Signed   By: Jules Schick M.D.   On: 02/19/2023 17:04    MDM - Normal Sinus Rhythm EKG  - Normal Chest CT  - Low suspicion for PE - Chest pain unresolved, PO Flexeril ordered.  - 30 day supply of Lovenox meds2beds prior to discharge.  - Transfer of care Tyler Aas, [name] @ [address] ([place]) Suzie Portela, MSN, [name]   Assumed care of patient - on reassessment, flexeril relieved her chest pain and she requested to go home.  Assessment and Plan  Musculoskeletal chest pain History of PE Chronic hypertension Fetal heart tones present [redacted] weeks gestation of pregnancy  Allergies as of 02/19/2023       Reactions   Bactrim Anaphylaxis, Hives   Hydrocodone-acetaminophen Anaphylaxis   Nifedipine Swelling, Anaphylaxis   Swelling of tongue   Pineapple Itching   Prednisone Anaphylaxis, Hives   Has been on Dexamethasone without issue.   Shrimp Extract Itching        Medication List     TAKE these medications    acetaminophen 325 MG tablet Commonly known as: TYLENOL Take 2 tablets (650 mg total) by mouth every 6 (six) hours as needed for headache or mild pain (for pain scale < 4  OR  temperature  >/=  100.5 F).   albuterol 108 (90 Base) MCG/ACT inhaler Commonly known as: VENTOLIN HFA Inhale 2 puffs into the lungs every 6 (six) hours as needed for wheezing or shortness of breath.   albuterol (2.5 MG/3ML) 0.083% nebulizer solution Commonly known as: PROVENTIL Take 3 mLs (2.5 mg total) by nebulization every 6 (six) hours as needed for wheezing or shortness of breath.   buPROPion 150 MG 24 hr tablet Commonly known as: Wellbutrin XL Take 1 tablet (150 mg total) by mouth daily.   cholecalciferol 25 MCG (1000 UNIT) tablet Commonly known as: VITAMIN D3 Take 2,000 Units by  mouth daily.   clonazePAM 0.5 MG tablet Commonly known as: KLONOPIN Take 1 tablet (0.5 mg total) by mouth 2 (two) times daily as needed for anxiety.   cyclobenzaprine 10 MG tablet Commonly known as: FLEXERIL Take 1 tablet (10 mg total) by mouth every 8 (eight) hours as needed for muscle spasms.   enoxaparin 150 MG/ML injection Commonly known as: LOVENOX Inject 1 mL (150 mg total) into the skin in the morning and at bedtime. What changed: when to take this   EPINEPHrine 0.1 MG/0.1ML Soaj Inject 1 Dose as directed as needed (for anaphylaxis).   hydroxychloroquine 200 MG tablet Commonly known as: PLAQUENIL Take by mouth daily.   labetalol 100 MG tablet Commonly known as: NORMODYNE Take 100 mg by mouth 2 (two) times daily.   promethazine-dextromethorphan 6.25-15 MG/5ML syrup Commonly known as: PROMETHAZINE-DM Take 5 mLs by mouth 4 (four) times daily as needed.   sertraline 50 MG tablet Commonly known as: ZOLOFT Take 50 mg by mouth daily       Edd Arbour, [name], MSN, [employer] Certified Nurse Midwife, [facility]

## 2023-02-20 ENCOUNTER — Other Ambulatory Visit (HOSPITAL_COMMUNITY): Payer: Self-pay

## 2023-02-21 ENCOUNTER — Other Ambulatory Visit: Payer: Self-pay | Admitting: Obstetrics and Gynecology

## 2023-02-21 DIAGNOSIS — Z363 Encounter for antenatal screening for malformations: Secondary | ICD-10-CM

## 2023-02-26 ENCOUNTER — Other Ambulatory Visit: Payer: Self-pay

## 2023-03-14 ENCOUNTER — Encounter: Payer: Self-pay | Admitting: *Deleted

## 2023-04-03 ENCOUNTER — Other Ambulatory Visit: Payer: Self-pay

## 2023-04-06 ENCOUNTER — Encounter: Payer: Self-pay | Admitting: *Deleted

## 2023-04-06 DIAGNOSIS — O09299 Supervision of pregnancy with other poor reproductive or obstetric history, unspecified trimester: Secondary | ICD-10-CM | POA: Insufficient documentation

## 2023-04-06 DIAGNOSIS — O10919 Unspecified pre-existing hypertension complicating pregnancy, unspecified trimester: Secondary | ICD-10-CM | POA: Insufficient documentation

## 2023-04-10 ENCOUNTER — Other Ambulatory Visit: Payer: Self-pay | Admitting: *Deleted

## 2023-04-10 ENCOUNTER — Ambulatory Visit: Payer: Medicaid Other

## 2023-04-10 ENCOUNTER — Ambulatory Visit: Payer: Medicaid Other | Attending: Obstetrics and Gynecology

## 2023-04-10 ENCOUNTER — Ambulatory Visit: Payer: Medicaid Other | Admitting: *Deleted

## 2023-04-10 DIAGNOSIS — O10919 Unspecified pre-existing hypertension complicating pregnancy, unspecified trimester: Secondary | ICD-10-CM

## 2023-04-10 DIAGNOSIS — Z363 Encounter for antenatal screening for malformations: Secondary | ICD-10-CM | POA: Insufficient documentation

## 2023-04-19 ENCOUNTER — Ambulatory Visit: Payer: Medicaid Other | Admitting: *Deleted

## 2023-04-19 ENCOUNTER — Ambulatory Visit: Payer: Medicaid Other | Attending: Maternal & Fetal Medicine | Admitting: Maternal & Fetal Medicine

## 2023-04-19 VITALS — BP 134/89 | HR 107

## 2023-04-19 DIAGNOSIS — O99112 Other diseases of the blood and blood-forming organs and certain disorders involving the immune mechanism complicating pregnancy, second trimester: Secondary | ICD-10-CM

## 2023-04-19 DIAGNOSIS — M329 Systemic lupus erythematosus, unspecified: Secondary | ICD-10-CM | POA: Diagnosis not present

## 2023-04-19 DIAGNOSIS — Z86711 Personal history of pulmonary embolism: Secondary | ICD-10-CM | POA: Diagnosis not present

## 2023-04-19 DIAGNOSIS — O10912 Unspecified pre-existing hypertension complicating pregnancy, second trimester: Secondary | ICD-10-CM

## 2023-04-19 DIAGNOSIS — Z8673 Personal history of transient ischemic attack (TIA), and cerebral infarction without residual deficits: Secondary | ICD-10-CM | POA: Insufficient documentation

## 2023-04-19 DIAGNOSIS — O99891 Other specified diseases and conditions complicating pregnancy: Secondary | ICD-10-CM

## 2023-04-19 DIAGNOSIS — Z8739 Personal history of other diseases of the musculoskeletal system and connective tissue: Secondary | ICD-10-CM | POA: Diagnosis not present

## 2023-04-19 DIAGNOSIS — O112 Pre-existing hypertension with pre-eclampsia, second trimester: Secondary | ICD-10-CM | POA: Diagnosis not present

## 2023-04-19 DIAGNOSIS — O10012 Pre-existing essential hypertension complicating pregnancy, second trimester: Secondary | ICD-10-CM | POA: Insufficient documentation

## 2023-04-19 DIAGNOSIS — O10919 Unspecified pre-existing hypertension complicating pregnancy, unspecified trimester: Secondary | ICD-10-CM

## 2023-04-19 DIAGNOSIS — O09292 Supervision of pregnancy with other poor reproductive or obstetric history, second trimester: Secondary | ICD-10-CM | POA: Insufficient documentation

## 2023-04-19 DIAGNOSIS — D6861 Antiphospholipid syndrome: Secondary | ICD-10-CM

## 2023-04-19 DIAGNOSIS — O99210 Obesity complicating pregnancy, unspecified trimester: Secondary | ICD-10-CM

## 2023-04-19 DIAGNOSIS — Z3A20 20 weeks gestation of pregnancy: Secondary | ICD-10-CM | POA: Diagnosis not present

## 2023-04-19 DIAGNOSIS — Z87898 Personal history of other specified conditions: Secondary | ICD-10-CM | POA: Insufficient documentation

## 2023-04-19 NOTE — Progress Notes (Signed)
MFM Consultation  Ms. Eakle is a 37 yo G6P3 at 83 w 2d with EDD of 02/15/23. She is seen at the request of Doyce Loose NP regarding the following pregnancy issues:   She is doing well without complaints. She reports that this is an unplanned pregnancy but overall happy.   She had a low risk NIPT and normal 1hr GTT.  1) History of Preeclampsia with preterm delivery at 32 weeks.  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     04/19/2023    9:59 AM 04/10/2023   12:30 PM 02/19/2023    7:49 PM  Vitals with BMI  Systolic [phone]  Diastolic 89 101 84  Pulse 107 956 78   OB History  Gravida Para Term Preterm AB Living  6 3 1 2 2 3   SAB IAB Ectopic Multiple Live Births  2     0 3    # Outcome Date GA Lbr Len/2nd Weight Sex Type Anes PTL Lv  6 Current           5 Preterm 01/28/22 [redacted]w[redacted]d 04:32 4 lb 4.8 oz (1.95 kg) M Vag-Spont EPI  LIV  4 Preterm 01/15/19 [redacted]w[redacted]d 597:36 / 00:10 6 lb 5.1 oz (2.865 kg) M Vag-Spont EPI  LIV  3 Term 10/25/13 [redacted]w[redacted]d 09:43 / 00:14  M Vag-Vacuum EPI  LIV  2 SAB      SAB     1 SAB            Past Medical History:  Diagnosis Date   Allergy to shellfish 06/11/2018   Altered thought processes 04/04/2017   r/o embolic event     Anxiety    doing good now   Asthma    Chest pain 04/04/2017   Chronic hypertension affecting pregnancy 07/25/2018   Chronic hypertension complicating or reason for care during pregnancy, third trimester 01/12/2019   COVID 07/2021   mild   Depression    doing good   Headache(784.0)    HSV infection    Hypertension    was taken off meds after last preg   Infection    UTI   Knee pain 01/12/2020   Left knee pain 10/20/2020   Lupus (HCC)    dx age 29   Nonintractable headache 04/04/2017   Normal postpartum course 01/17/2019   Pneumonia    blood clot  appeared during the pneumonia   Postpartum anemia 01/17/2019   Preeclampsia 01/25/2022   Pregnant 07/01/2018   Pulmonary embolism (HCC) 06/4/82011   Pulmonary embolus (HCC) 02/09/2017   Right knee pain 10/12/2020   Shortness of breath    Sicca syndrome (HCC) 08/27/2018   STD (female) 01/18/2012   Hx GC 8/10  Hx Chlamydia 6/13     STD (sexually transmitted disease) 02/2009   POSITIVE GC   Threatened preterm labor 12/25/2018   Threatened preterm labor, third trimester 12/23/2018   UTI (urinary tract infection)    Word finding difficulty 04/04/2017   Past Surgical History:  Procedure Laterality Date   ADENOIDECTOMY     TONSILLECTOMY AND ADENOIDECTOMY  1998   WISDOM TOOTH EXTRACTION     Family History  Problem Relation Age of Onset   Asthma Mother    Healthy Father    Asthma Brother    Diabetes Maternal Aunt    Lupus  Paternal Aunt    Stroke Paternal Aunt    Cancer Maternal Grandmother 84       COLON CA   Diabetes Maternal Grandmother    Hypertension Maternal Grandfather    Cancer Maternal Grandfather        prostate   Cancer Paternal Grandmother        breast   Lupus Paternal Grandmother    Anesthesia problems Neg Hx    Hypotension Neg Hx    Malignant hyperthermia Neg Hx    Pseudochol deficiency Neg Hx    Social History   Socioeconomic History   Marital status: Married    Spouse name: Not on file   Number of children: Not on file   Years of education: Not on file   Highest education level: Not on file  Occupational History   Not on file  Tobacco Use   Smoking status: Never   Smokeless tobacco: Never  Vaping Use   Vaping status: Never Used  Substance and Sexual Activity   Alcohol use: Not Currently    Comment: occsinally   Drug use: No   Sexual activity: Not Currently    Partners: Male    Birth control/protection: None  Other Topics Concern   Not on file  Social History Narrative   Not on file   Social Determinants of Health   Financial Resource  Strain: Low Risk  (01/12/2019)   Overall Financial Resource Strain (CARDIA)    Difficulty of Paying Living Expenses: Not hard at all  Food Insecurity: No Food Insecurity (10/24/2021)   Received from [facility]   Hunger Vital Sign  Transportation Needs: No Transportation Needs (10/24/2021)   Received from [facility]   PRAPARE - Transportation  Physical Activity: Unknown (01/12/2019)   Exercise Vital Sign    Days of Exercise per Week: 0 days    Minutes of Exercise per Session: Not on file  Stress: No Stress Concern Present (01/12/2019)   [employer] of Occupational Health - Occupational Stress Questionnaire    Feeling of Stress : Not at all  Social Connections: Moderately Integrated (01/12/2019)   Social Connection and Isolation Panel [NHANES]    Frequency of Communication with Friends and Family: More than three times a week    Frequency of Social Gatherings with Friends and Family: Twice a week    Attends Religious Services: 1 to 4 times per year    Active Member of [employer] or Organizations: No    Attends Banker Meetings: Never    Marital Status: Living with partner  Intimate Partner Violence: Not At Risk (01/12/2019)   Humiliation, Afraid, Rape, and Kick questionnaire    Fear of Current or Ex-Partner: No    Emotionally Abused: No    Physically Abused: No    Sexually Abused: No   Allergies  Allergen Reactions   Bactrim Anaphylaxis and Hives   Hydrocodone-Acetaminophen Anaphylaxis   Nifedipine Swelling and Anaphylaxis    Swelling of tongue   Pineapple Itching   Prednisone Anaphylaxis and Hives    Has been on Dexamethasone without issue.   Shrimp Extract Itching   Prior imaging demonstrated: 04/10/23 Single intrauterine pregnancy with measurements consistent with dates.  EFW 293 g at 72% 19w 3d.   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  Ms. Wiesner is doing well overall she denies any  flares since her prior pregnancy. She continues on Plaquenil 200 mg daily. She is scheduled to see her  Rheumatologist at [facility] and she has a fetal echocardiogram scheduled in the next week or so.  She understands the increased risk for CHB, preeclampsia, inpatient admission, and fetal growth restriction. She understands the role of plaquenil in reducing flares, she also conveyed her understand that flares are often reduced in pregnancy due to its immune suppressant state.  Given the above risk to the pregnancy we recommend serial growth every 4 weeks and will initiate weekly testing at 32 weeks.   We will recommend baseline labs to include CBC, CMP, UPC and complement.   2) Early preeclampsia with severe features We discussed that there is a 50-75% risk for recurrent preeclampsia. She will take low dose ASA for prevention of preeclampsia twice per day.   Growth and fetal surveillance as noted above.   Given current medical risk we recommend delivery around 37 weeks unless completely stable.   3) Chronic hypertension: We discussed risk as noted above but in addition blood pressure should avg 135/85 mmHg and titrate medical therapy as needed.   4) APLAS with history of PE and stroke.  Continue therapeutic Lovenox and check Factor Xa in 1 week to adjust dose as necessary. Please check monthly.   5) BMI >40 We recommend total weight gain of 11-20 lbs. Fetal surveillance as noted above.   Cesarean delivery for obstetric indications.   Lastly Ms. Schlueter desires permanent sterilization.  All questions answered I spent 60 minutes in consultation, review of records and care coordination.   All question answered.   Novella Olive, MD

## 2023-04-25 ENCOUNTER — Ambulatory Visit: Payer: Medicaid Other | Attending: Cardiology | Admitting: Cardiology

## 2023-05-03 ENCOUNTER — Other Ambulatory Visit: Payer: Self-pay

## 2023-05-15 ENCOUNTER — Ambulatory Visit: Payer: Medicaid Other | Attending: Obstetrics and Gynecology

## 2023-05-15 ENCOUNTER — Ambulatory Visit: Payer: Medicaid Other

## 2023-05-21 ENCOUNTER — Other Ambulatory Visit: Payer: Self-pay

## 2023-05-21 ENCOUNTER — Other Ambulatory Visit (HOSPITAL_COMMUNITY): Payer: Self-pay

## 2023-05-22 ENCOUNTER — Other Ambulatory Visit (HOSPITAL_COMMUNITY): Payer: Self-pay

## 2023-05-24 ENCOUNTER — Inpatient Hospital Stay (HOSPITAL_COMMUNITY)
Admission: AD | Admit: 2023-05-24 | Discharge: 2023-05-24 | Disposition: A | Discharge status: Home / Self Care | Payer: Medicaid Other | Attending: Obstetrics & Gynecology | Admitting: Obstetrics & Gynecology

## 2023-05-24 ENCOUNTER — Encounter (HOSPITAL_COMMUNITY): Payer: Self-pay | Admitting: Obstetrics & Gynecology

## 2023-05-24 DIAGNOSIS — O4702 False labor before 37 completed weeks of gestation, second trimester: Secondary | ICD-10-CM | POA: Diagnosis not present

## 2023-05-24 DIAGNOSIS — Z3A25 25 weeks gestation of pregnancy: Secondary | ICD-10-CM | POA: Insufficient documentation

## 2023-05-24 DIAGNOSIS — O26892 Other specified pregnancy related conditions, second trimester: Secondary | ICD-10-CM | POA: Insufficient documentation

## 2023-05-24 DIAGNOSIS — O10912 Unspecified pre-existing hypertension complicating pregnancy, second trimester: Secondary | ICD-10-CM | POA: Diagnosis not present

## 2023-05-24 DIAGNOSIS — R109 Unspecified abdominal pain: Secondary | ICD-10-CM | POA: Diagnosis not present

## 2023-05-24 LAB — URINALYSIS, ROUTINE W REFLEX MICROSCOPIC
Bacteria, UA: NONE SEEN
Bilirubin Urine: NEGATIVE
Glucose, UA: NEGATIVE mg/dL
Hgb urine dipstick: NEGATIVE
Ketones, ur: NEGATIVE mg/dL
Nitrite: NEGATIVE
Protein, ur: NEGATIVE mg/dL
Specific Gravity, Urine: 1.012 (ref 1.005–1.030)
pH: 7 (ref 5.0–8.0)

## 2023-05-24 LAB — FETAL FIBRONECTIN: Fetal Fibronectin: NEGATIVE

## 2023-05-24 LAB — WET PREP, GENITAL
Clue Cells Wet Prep HPF POC: NONE SEEN
Sperm: NONE SEEN
Trich, Wet Prep: NONE SEEN
WBC, Wet Prep HPF POC: >=10 — AB (ref <10)
Yeast Wet Prep HPF POC: NONE SEEN

## 2023-05-24 MED ORDER — TERBUTALINE SULFATE 1 MG/ML IJ SOLN
0.2500 mg | Freq: Once | INTRAMUSCULAR | Status: AC
  Administered 2023-05-24: 0.25 mg via SUBCUTANEOUS
  Filled 2023-05-24: qty 1

## 2023-05-24 NOTE — MAU Note (Signed)
.  Hayley Webb is a 37 y.o. at [redacted]w[redacted]d here in MAU reporting: she began having ctx last night around 1900. They became stronger and more consistent around 0400 this morning. She reports that they are coming every 2-3 minutes and last for a couple of minutes. Denies VB or LOF. Reports +FM.  LMP: N/A Onset of complaint: Last night Pain score: 8/10 Vitals:   05/24/23 0711  BP: (!) 147/93  Pulse: (!) 102  Resp: 20  Temp: 97.7 F (36.5 C)  SpO2: 97%     FHT:125 Lab orders placed from triage:  UA

## 2023-05-24 NOTE — MAU Provider Note (Addendum)
Chief Complaint:  Contractions   Event Date/Time   First Provider Initiated Contact with Patient 05/24/23 [phone]     HPI: Hayley Webb is a 37 y.o. [id number] at 50w2dwho presents to maternity admissions reporting preterm contractions since 7pm  Denies leaking or bleeding. No history of preterm labor. She reports good fetal movement, denies LOF, vaginal bleeding, urinary symptoms, or fever/chills.   Does have a history of hypertension  Abdominal Pain This is a new problem. The current episode started yesterday. The problem has been unchanged. The quality of the pain is cramping. The abdominal pain does not radiate. Pertinent negatives include no constipation, diarrhea or dysuria. Nothing aggravates the pain. The pain is relieved by Nothing. She has tried nothing for the symptoms.   RN Note: Hayley Webb is a 37 y.o. at [redacted]w[redacted]d here in MAU reporting: she began having ctx last night around 1900. They became stronger and more consistent around 0400 this morning. She reports that they are coming every 2-3 minutes and last for a couple of minutes. Denies VB or LOF. Reports +FM. Onset of complaint: Last night          Pain score: 8/10  Past Medical History: Past Medical History:  Diagnosis Date   Allergy to shellfish 06/11/2018   Altered thought processes 04/04/2017   r/o embolic event     Anxiety    doing good now   Asthma    Chest pain 04/04/2017   Chronic hypertension affecting pregnancy 07/25/2018   Chronic hypertension complicating or reason for care during pregnancy, third trimester 01/12/2019   COVID 07/2021   mild   Depression    doing good   Headache(784.0)    HSV infection    Hypertension    was taken off meds after last preg   Infection    UTI   Knee pain 01/12/2020   Left knee pain 10/20/2020   Lupus    dx age 60   Nonintractable headache 04/04/2017   Normal postpartum course 01/17/2019   Pneumonia    blood clot appeared during the pneumonia   Postpartum anemia  01/17/2019   Preeclampsia 01/25/2022   Pregnant 07/01/2018   Pulmonary embolism (HCC) 06/4/82011   Pulmonary embolus (HCC) 02/09/2017   Right knee pain 10/12/2020   Shortness of breath    Sicca syndrome (HCC) 08/27/2018   STD (female) 01/18/2012   Hx GC 8/10  Hx Chlamydia 6/13     STD (sexually transmitted disease) 02/2009   POSITIVE GC   Threatened preterm labor 12/25/2018   Threatened preterm labor, third trimester 12/23/2018   UTI (urinary tract infection)    Word finding difficulty 04/04/2017    Past obstetric history: OB History  Gravida Para Term Preterm AB Living  6 3 1 2 2 3   SAB IAB Ectopic Multiple Live Births  2     0 3    # Outcome Date GA Lbr Len/2nd Weight Sex Type Anes PTL Lv  6 Current           5 Preterm 01/28/22 [redacted]w[redacted]d 04:32 1950 g M Vag-Spont EPI  LIV  4 Preterm 01/15/19 [redacted]w[redacted]d 597:36 / 00:10 2865 g M Vag-Spont EPI  LIV  3 Term 10/25/13 [redacted]w[redacted]d 09:43 / 00:14  M Vag-Vacuum EPI  LIV  2 SAB      SAB     1 SAB             Past Surgical History: Past Surgical History:  Procedure Laterality Date   ADENOIDECTOMY  TONSILLECTOMY AND ADENOIDECTOMY  1998   WISDOM TOOTH EXTRACTION      Family History: Family History  Problem Relation Age of Onset   Asthma Mother    Healthy Father    Asthma Brother    Diabetes Maternal Aunt    Lupus Paternal Aunt    Stroke Paternal Aunt    Cancer Maternal Grandmother 72       COLON CA   Diabetes Maternal Grandmother    Hypertension Maternal Grandfather    Cancer Maternal Grandfather        prostate   Cancer Paternal Grandmother        breast   Lupus Paternal Grandmother    Anesthesia problems Neg Hx    Hypotension Neg Hx    Malignant hyperthermia Neg Hx    Pseudochol deficiency Neg Hx     Social History: Social History   Tobacco Use   Smoking status: Never   Smokeless tobacco: Never  Vaping Use   Vaping status: Never Used  Substance Use Topics   Alcohol use: Not Currently    Comment: occsinally   Drug  use: No    Allergies:  Allergies  Allergen Reactions   Bactrim Anaphylaxis and Hives   Hydrocodone-Acetaminophen Anaphylaxis   Nifedipine Swelling and Anaphylaxis    Swelling of tongue   Pineapple Itching   Prednisone Anaphylaxis and Hives    Has been on Dexamethasone without issue.   Shrimp Extract Itching    Meds:  Medications Prior to Admission  Medication Sig Dispense Refill Last Dose   cholecalciferol (VITAMIN D3) 25 MCG (1000 UNIT) tablet Take 2,000 Units by mouth daily.   05/23/2023   enoxaparin (LOVENOX) 150 MG/ML injection Inject 1 mL (150 mg total) into the skin in the morning and at bedtime. 30 mL 1 05/23/2023   hydroxychloroquine (PLAQUENIL) 200 MG tablet Take by mouth daily.   05/23/2023   labetalol (NORMODYNE) 100 MG tablet Take 100 mg by mouth 3 (three) times daily.   05/23/2023   acetaminophen (TYLENOL) 325 MG tablet Take 2 tablets (650 mg total) by mouth every 6 (six) hours as needed for headache or mild pain (for pain scale < 4  OR  temperature  >/=  100.5 F). (Patient not taking: Reported on 04/27/2022) 60 tablet 3    albuterol (PROVENTIL) (2.5 MG/3ML) 0.083% nebulizer solution Take 3 mLs (2.5 mg total) by nebulization every 6 (six) hours as needed for wheezing or shortness of breath. 75 mL 0    albuterol (VENTOLIN HFA) 108 (90 Base) MCG/ACT inhaler Inhale 2 puffs into the lungs every 6 (six) hours as needed for wheezing or shortness of breath. 18 g 0    cyclobenzaprine (FLEXERIL) 10 MG tablet Take 1 tablet (10 mg total) by mouth every 8 (eight) hours as needed for muscle spasms. 30 tablet 1    EPINEPHrine 0.1 MG/0.1ML SOAJ Inject 1 Dose as directed as needed (for anaphylaxis). (Patient not taking: Reported on 01/04/2022) 2 each 2    promethazine-dextromethorphan (PROMETHAZINE-DM) 6.25-15 MG/5ML syrup Take 5 mLs by mouth 4 (four) times daily as needed. 118 mL 0     I have reviewed patient's Past Medical Hx, Surgical Hx, Family Hx, Social Hx, medications and allergies.    ROS:  Review of Systems  Gastrointestinal:  Positive for abdominal pain. Negative for constipation and diarrhea.  Genitourinary:  Negative for dysuria.   Other systems negative  Physical Exam  Patient Vitals for the past 24 hrs:  BP Temp Temp src Pulse  Resp SpO2  05/24/23 0732 134/80 -- -- 93 -- --  05/24/23 0721 (!) 140/85 -- -- 95 -- 100 %  05/24/23 0711 (!) 147/93 97.7 F (36.5 C) Oral (!) 102 20 97 %   Constitutional: Well-developed, well-nourished female in no acute distress.  Cardiovascular: normal rate and rhythm Respiratory: normal effort, clear to auscultation bilaterally GI: Abd soft, non-tender, gravid appropriate for gestational age.   No rebound or guarding. MS: Extremities nontender, no edema, normal ROM Neurologic: Alert and oriented x 4.  GU: Neg CVAT.  PELVIC EXAM:  Dilation: Fingertip Effacement (%): Thick Station: Ballotable Exam by:: Wynelle Bourgeois, [name]  FHT:  Baseline 145 , moderate variability, accelerations present, no decelerations Contractions: not tracing well but patient feels them   Labs: Fetal fibronectin sent  --/--/O POS (01/01 1503)  Imaging:  No results found.  MAU Course/MDM: I have reviewed the triage vital signs and the nursing notes.   Pertinent labs & imaging results that were available during my care of the patient were reviewed by me and considered in my medical decision making (see chart for details).      I have reviewed her medical records including past results, notes and treatments.   I have ordered FFn  NST reviewed  Treatments in MAU included Terbutaline, since patient has a history of anaphylaxis with Nifedipine.    Assessment: Single IUP at [redacted]w[redacted]d Threatened preterm Labor  Plan: Care turned over to oncoming provider   Wynelle Bourgeois [name], MSN Certified Nurse-Midwife 05/24/2023 7:47 AM  -Care assumed of 37 y.o. [id number] at [redacted]w[redacted]d who presents for preterm contractions. Pregnancy and medical history  significant for hypertension.  In room to meet patient.   .Reassessment (9:29 AM)  - Reports that contraction pain has stopped. Reports feeling regular and vigorous fetal movement - Patient updated on plan of care. Waiting for FFN results and then will reassess cervical dilation.  - SVE unchanged: 0.5/T/H. Reports return of irregular contractions.  - Wet prep, GC ordered for patient self-collect.  Medical Decision Making Amount and/or Complexity of Data Reviewed External Data Reviewed: labs and notes.    Details: Notes and labs from [facility] reviewed Labs: ordered.    Details: Wet prep, GC, FfN  Risk OTC drugs.    Reassessment (11:38 AM) - Contractions returned, irregular, less uncomfortable. Reports feeling regular and vigorous fetal movement - Wet prep negative. GC pending.  - Consulted Dr. Jolayne Panther with patient and POC, agrees discharge is appropriate at this time.  - Follow up at [facility] in 1 week.  - Return to MAU as needed for worsening symptoms.  - Discharge home in stable condition.  Lamont Snowball, MSN, [name], RNC-OB Certified Nurse Midwife, [facility] 05/24/2023 11:40 AM

## 2023-05-24 NOTE — MAU Note (Signed)
RN called lab to inquire about Urinalysis. Lab tech, Harriett Sine, answered and informed RN that that they have received sample and will process now.Marland Kitchen

## 2023-05-25 ENCOUNTER — Other Ambulatory Visit: Payer: Self-pay

## 2023-05-25 LAB — GC/CHLAMYDIA PROBE AMP (~~LOC~~) NOT AT ARMC
Chlamydia: NEGATIVE
Comment: NEGATIVE
Comment: NORMAL
Neisseria Gonorrhea: NEGATIVE

## 2023-05-28 ENCOUNTER — Other Ambulatory Visit: Payer: Self-pay

## 2023-06-06 ENCOUNTER — Inpatient Hospital Stay (HOSPITAL_COMMUNITY)
Admission: AD | Admit: 2023-06-06 | Discharge: 2023-06-07 | Disposition: A | Discharge status: Home / Self Care | Payer: Medicaid Other | Attending: Obstetrics & Gynecology | Admitting: Obstetrics & Gynecology

## 2023-06-06 ENCOUNTER — Encounter (HOSPITAL_COMMUNITY): Payer: Self-pay | Admitting: Obstetrics & Gynecology

## 2023-06-06 DIAGNOSIS — O4702 False labor before 37 completed weeks of gestation, second trimester: Secondary | ICD-10-CM

## 2023-06-06 DIAGNOSIS — O26892 Other specified pregnancy related conditions, second trimester: Secondary | ICD-10-CM | POA: Diagnosis not present

## 2023-06-06 DIAGNOSIS — O98512 Other viral diseases complicating pregnancy, second trimester: Secondary | ICD-10-CM | POA: Insufficient documentation

## 2023-06-06 DIAGNOSIS — R112 Nausea with vomiting, unspecified: Secondary | ICD-10-CM

## 2023-06-06 DIAGNOSIS — O09292 Supervision of pregnancy with other poor reproductive or obstetric history, second trimester: Secondary | ICD-10-CM | POA: Insufficient documentation

## 2023-06-06 DIAGNOSIS — O10912 Unspecified pre-existing hypertension complicating pregnancy, second trimester: Secondary | ICD-10-CM | POA: Insufficient documentation

## 2023-06-06 DIAGNOSIS — Z3A27 27 weeks gestation of pregnancy: Secondary | ICD-10-CM | POA: Diagnosis not present

## 2023-06-06 DIAGNOSIS — O26899 Other specified pregnancy related conditions, unspecified trimester: Secondary | ICD-10-CM

## 2023-06-06 NOTE — MAU Note (Signed)
Hayley Webb is a 37 y.o. at [redacted]w[redacted]d here in MAU reporting: Pt states that she took Gas-X last night because she had gas and had a loose BM after. Pt states she stated having cramping today at 1200 that got more intense tonight at 1900. Pt states the cramping/ctx came back to back around 1900. Pt states she is having 9/10 pain in her mid and lower abdomen and lower back. The pain comes and goes. Pt denies VB or LOF. +FM   Onset of complaint: 1200 today Pain score: 9/10  Vitals:   06/06/23 2348  BP: (!) 141/95  Pulse: (!) 107  Resp: 20  Temp: 98.4 F (36.9 C)  SpO2: 99%     FHT:140 Lab orders placed from triage:  UA

## 2023-06-06 NOTE — MAU Provider Note (Signed)
Chief Complaint:  Contractions   Event Date/Time   First Provider Initiated Contact with Patient 06/06/23 2354     HPI: Hayley Webb is a 37 y.o. [id number] at 36w1dwho presents to maternity admissions reporting abdominal cramping.  States feels like contractions but did try some GasX without much relief.  Was treated for preterm contractions at the end of October.  FFn then was negative. . She reports good fetal movement, denies LOF, vaginal bleeding,  urinary symptoms, or fever/chills.    Abdominal Pain This is a recurrent problem. The current episode started today. The quality of the pain is cramping. The abdominal pain does not radiate. Associated symptoms include nausea. Pertinent negatives include no constipation, dysuria or fever. Nothing aggravates the pain. The pain is relieved by Nothing.   RN Note: Hayley Webb is a 37 y.o. at [redacted]w[redacted]d here in MAU reporting: Pt states that she took Gas-X last night because she had gas and had a loose BM after. Pt states she stated having cramping today at 1200 that got more intense tonight at 1900. Pt states the cramping/ctx came back to back around 1900. Pt states she is having 9/10 pain in her mid and lower abdomen and lower back. The pain comes and goes. Pt denies VB or LOF. +FM   Onset of complaint: 1200 today  Past Medical History: Past Medical History:  Diagnosis Date   Allergy to shellfish 06/11/2018   Altered thought processes 04/04/2017   r/o embolic event     Anxiety    doing good now   Asthma    Chest pain 04/04/2017   Chronic hypertension affecting pregnancy 07/25/2018   Chronic hypertension complicating or reason for care during pregnancy, third trimester 01/12/2019   COVID 07/2021   mild   Depression    doing good   Headache(784.0)    HSV infection    Hypertension    was taken off meds after last preg   Infection    UTI   Knee pain 01/12/2020   Left knee pain 10/20/2020   Lupus    dx age 60   Nonintractable headache  04/04/2017   Normal postpartum course 01/17/2019   Pneumonia    blood clot appeared during the pneumonia   Postpartum anemia 01/17/2019   Preeclampsia 01/25/2022   Pregnant 07/01/2018   Pulmonary embolism (HCC) 06/4/82011   Pulmonary embolus (HCC) 02/09/2017   Right knee pain 10/12/2020   Shortness of breath    Sicca syndrome (HCC) 08/27/2018   STD (female) 01/18/2012   Hx GC 8/10  Hx Chlamydia 6/13     STD (sexually transmitted disease) 02/2009   POSITIVE GC   Threatened preterm labor 12/25/2018   Threatened preterm labor, third trimester 12/23/2018   UTI (urinary tract infection)    Word finding difficulty 04/04/2017    Past obstetric history: OB History  Gravida Para Term Preterm AB Living  6 3 1 2 2 3   SAB IAB Ectopic Multiple Live Births  2     0 3    # Outcome Date GA Lbr Len/2nd Weight Sex Type Anes PTL Lv  6 Current           5 Preterm 01/28/22 [redacted]w[redacted]d 04:32 1950 g M Vag-Spont EPI  LIV  4 Preterm 01/15/19 [redacted]w[redacted]d 597:36 / 00:10 2865 g M Vag-Spont EPI  LIV  3 Term 10/25/13 [redacted]w[redacted]d 09:43 / 00:14  M Vag-Vacuum EPI  LIV  2 SAB      SAB     1  SAB             Past Surgical History: Past Surgical History:  Procedure Laterality Date   ADENOIDECTOMY     TONSILLECTOMY AND ADENOIDECTOMY  1998   WISDOM TOOTH EXTRACTION      Family History: Family History  Problem Relation Age of Onset   Asthma Mother    Healthy Father    Asthma Brother    Diabetes Maternal Aunt    Lupus Paternal Aunt    Stroke Paternal Aunt    Cancer Maternal Grandmother 72       COLON CA   Diabetes Maternal Grandmother    Hypertension Maternal Grandfather    Cancer Maternal Grandfather        prostate   Cancer Paternal Grandmother        breast   Lupus Paternal Grandmother    Anesthesia problems Neg Hx    Hypotension Neg Hx    Malignant hyperthermia Neg Hx    Pseudochol deficiency Neg Hx     Social History: Social History   Tobacco Use   Smoking status: Never   Smokeless tobacco:  Never  Vaping Use   Vaping status: Never Used  Substance Use Topics   Alcohol use: Not Currently    Comment: occsinally   Drug use: No    Allergies:  Allergies  Allergen Reactions   Bactrim Anaphylaxis and Hives   Hydrocodone-Acetaminophen Anaphylaxis   Nifedipine Swelling and Anaphylaxis    Swelling of tongue   Pineapple Itching   Prednisone Anaphylaxis and Hives    Has been on Dexamethasone without issue.   Shrimp Extract Itching    Meds:  Medications Prior to Admission  Medication Sig Dispense Refill Last Dose   acetaminophen (TYLENOL) 325 MG tablet Take 2 tablets (650 mg total) by mouth every 6 (six) hours as needed for headache or mild pain (for pain scale < 4  OR  temperature  >/=  100.5 F). 60 tablet 3 06/06/2023 at 1700   cholecalciferol (VITAMIN D3) 25 MCG (1000 UNIT) tablet Take 2,000 Units by mouth daily.   06/06/2023 at 0900   enoxaparin (LOVENOX) 150 MG/ML injection Inject 1 mL (150 mg total) into the skin in the morning and at bedtime. 30 mL 1 06/06/2023 at 0900   hydroxychloroquine (PLAQUENIL) 200 MG tablet Take by mouth daily.   06/06/2023 at 0900   labetalol (NORMODYNE) 100 MG tablet Take 100 mg by mouth 3 (three) times daily.   06/06/2023 at 0900   albuterol (PROVENTIL) (2.5 MG/3ML) 0.083% nebulizer solution Take 3 mLs (2.5 mg total) by nebulization every 6 (six) hours as needed for wheezing or shortness of breath. 75 mL 0    albuterol (VENTOLIN HFA) 108 (90 Base) MCG/ACT inhaler Inhale 2 puffs into the lungs every 6 (six) hours as needed for wheezing or shortness of breath. 18 g 0    cyclobenzaprine (FLEXERIL) 10 MG tablet Take 1 tablet (10 mg total) by mouth every 8 (eight) hours as needed for muscle spasms. 30 tablet 1    EPINEPHrine 0.1 MG/0.1ML SOAJ Inject 1 Dose as directed as needed (for anaphylaxis). (Patient not taking: Reported on 01/04/2022) 2 each 2    promethazine-dextromethorphan (PROMETHAZINE-DM) 6.25-15 MG/5ML syrup Take 5 mLs by mouth 4 (four) times  daily as needed. 118 mL 0     I have reviewed patient's Past Medical Hx, Surgical Hx, Family Hx, Social Hx, medications and allergies.   ROS:  Review of Systems  Constitutional:  Negative for fever.  Gastrointestinal:  Positive for abdominal pain and nausea. Negative for constipation.  Genitourinary:  Negative for dysuria.   Other systems negative  Physical Exam  Patient Vitals for the past 24 hrs:  BP Temp Temp src Pulse Resp SpO2 Height Weight  06/06/23 2348 (!) 141/95 98.4 F (36.9 C) Oral (!) 107 20 99 % 5\' 8"  (1.727 m) (!) 140.6 kg   Constitutional: Well-developed, well-nourished female in no acute distress.  Cardiovascular: normal rate  Respiratory: normal effort GI: Abd soft, non-tender, gravid appropriate for gestational age.   No rebound or guarding. MS: Extremities nontender, no edema, normal ROM Neurologic: Alert and oriented x 4.  GU: Neg CVAT.  PELVIC EXAM: Dilation: Fingertip Effacement (%): Thick Cervical Position: Posterior Exam by:: Mayford Knife, [name]    FHT:  Baseline 140 , moderate variability, small accelerations present, no decelerations Contractions: Not tracing well   Labs: Results for orders placed or performed during the hospital encounter of 06/06/23 (from the past 24 hour(s))  Urinalysis, Routine w reflex microscopic -Urine, Clean Catch     Status: Abnormal   Collection Time: 06/06/23 11:43 PM  Result Value Ref Range   Color, Urine YELLOW YELLOW   APPearance HAZY (A) CLEAR   Specific Gravity, Urine 1.011 1.005 - 1.030   pH 6.0 5.0 - 8.0   Glucose, UA NEGATIVE NEGATIVE mg/dL   Hgb urine dipstick SMALL (A) NEGATIVE   Bilirubin Urine NEGATIVE NEGATIVE   Ketones, ur NEGATIVE NEGATIVE mg/dL   Protein, ur NEGATIVE NEGATIVE mg/dL   Nitrite NEGATIVE NEGATIVE   Leukocytes,Ua LARGE (A) NEGATIVE   RBC / HPF 0-5 0 - 5 RBC/hpf   WBC, UA 11-20 0 - 5 WBC/hpf   Bacteria, UA FEW (A) NONE SEEN   Squamous Epithelial / HPF 0-5 0 - 5 /HPF   Mucus PRESENT    Fetal fibronectin     Status: None   Collection Time: 06/07/23 12:09 AM  Result Value Ref Range   Fetal Fibronectin NEGATIVE NEGATIVE    --/--/O POS (01/01 1503)  Imaging:  No results found.  MAU Course/MDM: I have reviewed the triage vital signs and the nursing notes.   Pertinent labs & imaging results that were available during my care of the patient were reviewed by me and considered in my medical decision making (see chart for details).      I have reviewed her medical records including past results, notes and treatments.   I have ordered labs and reviewed results. UA clear with negative Fetal Fibronectin NST reviewed  Treatments in MAU included Terbutaline given due to allergy to Nifedipine.  Cramping stopped entirely after dose. Nausea improved after zofran. .    Assessment: Single IUP at [redacted]w[redacted]d Threatened preterm labor, negative FFn Nausea and vomiting, suspect ?early GI virus  Plan: Discharge home Preterm Labor precautions and fetal kick counts Follow up in Office for prenatal visits  (has appt today) Encouraged to return if she develops worsening of symptoms, increase in pain, fever, or other concerning symptoms.   Pt stable at time of discharge.  Wynelle Bourgeois [name], MSN Certified Nurse-Midwife 06/06/2023 11:54 PM

## 2023-06-07 DIAGNOSIS — O26892 Other specified pregnancy related conditions, second trimester: Secondary | ICD-10-CM

## 2023-06-07 DIAGNOSIS — R109 Unspecified abdominal pain: Secondary | ICD-10-CM

## 2023-06-07 DIAGNOSIS — R112 Nausea with vomiting, unspecified: Secondary | ICD-10-CM

## 2023-06-07 DIAGNOSIS — Z3A27 27 weeks gestation of pregnancy: Secondary | ICD-10-CM

## 2023-06-07 DIAGNOSIS — O4702 False labor before 37 completed weeks of gestation, second trimester: Secondary | ICD-10-CM

## 2023-06-07 LAB — URINALYSIS, ROUTINE W REFLEX MICROSCOPIC
Bilirubin Urine: NEGATIVE
Glucose, UA: NEGATIVE mg/dL
Ketones, ur: NEGATIVE mg/dL
Nitrite: NEGATIVE
Protein, ur: NEGATIVE mg/dL
Specific Gravity, Urine: 1.011 (ref 1.005–1.030)
pH: 6 (ref 5.0–8.0)

## 2023-06-07 LAB — FETAL FIBRONECTIN: Fetal Fibronectin: NEGATIVE

## 2023-06-07 MED ORDER — ONDANSETRON 4 MG PO TBDP
4.0000 mg | ORAL_TABLET | Freq: Once | ORAL | Status: AC
  Administered 2023-06-07: 4 mg via ORAL
  Filled 2023-06-07: qty 1

## 2023-06-07 MED ORDER — TERBUTALINE SULFATE 1 MG/ML IJ SOLN
0.2500 mg | Freq: Once | INTRAMUSCULAR | Status: AC
  Administered 2023-06-07: 0.25 mg via SUBCUTANEOUS
  Filled 2023-06-07: qty 1

## 2023-06-07 MED ORDER — ONDANSETRON 4 MG PO TBDP: 4.0000 mg | ORAL_TABLET | Freq: Four times a day (QID) | ORAL | 0 refills | Status: DC | PRN

## 2023-06-12 ENCOUNTER — Ambulatory Visit: Payer: Medicaid Other | Attending: Obstetrics and Gynecology

## 2023-06-12 ENCOUNTER — Other Ambulatory Visit: Payer: Self-pay

## 2023-06-12 ENCOUNTER — Ambulatory Visit: Payer: Medicaid Other | Admitting: *Deleted

## 2023-06-12 ENCOUNTER — Other Ambulatory Visit: Payer: Self-pay | Admitting: *Deleted

## 2023-06-12 ENCOUNTER — Encounter: Payer: Self-pay | Admitting: *Deleted

## 2023-06-12 DIAGNOSIS — O09523 Supervision of elderly multigravida, third trimester: Secondary | ICD-10-CM

## 2023-06-12 DIAGNOSIS — Z3A28 28 weeks gestation of pregnancy: Secondary | ICD-10-CM

## 2023-06-12 DIAGNOSIS — O09293 Supervision of pregnancy with other poor reproductive or obstetric history, third trimester: Secondary | ICD-10-CM

## 2023-06-12 DIAGNOSIS — O10013 Pre-existing essential hypertension complicating pregnancy, third trimester: Secondary | ICD-10-CM | POA: Diagnosis not present

## 2023-06-12 DIAGNOSIS — E669 Obesity, unspecified: Secondary | ICD-10-CM | POA: Diagnosis not present

## 2023-06-12 DIAGNOSIS — O99891 Other specified diseases and conditions complicating pregnancy: Secondary | ICD-10-CM

## 2023-06-12 DIAGNOSIS — O99213 Obesity complicating pregnancy, third trimester: Secondary | ICD-10-CM | POA: Diagnosis not present

## 2023-06-12 DIAGNOSIS — O10919 Unspecified pre-existing hypertension complicating pregnancy, unspecified trimester: Secondary | ICD-10-CM | POA: Diagnosis present

## 2023-06-12 DIAGNOSIS — M3500 Sicca syndrome, unspecified: Secondary | ICD-10-CM

## 2023-06-12 DIAGNOSIS — M329 Systemic lupus erythematosus, unspecified: Secondary | ICD-10-CM

## 2023-06-12 DIAGNOSIS — O88213 Thromboembolism in pregnancy, third trimester: Secondary | ICD-10-CM

## 2023-06-19 ENCOUNTER — Telehealth: Payer: Medicaid Other | Admitting: Physician Assistant

## 2023-06-19 DIAGNOSIS — J0100 Acute maxillary sinusitis, unspecified: Secondary | ICD-10-CM | POA: Diagnosis not present

## 2023-06-19 MED ORDER — AMOXICILLIN 875 MG PO TABS: 875.0000 mg | ORAL_TABLET | Freq: Two times a day (BID) | ORAL | 0 refills | Status: AC

## 2023-06-19 NOTE — Progress Notes (Signed)
Virtual Visit Consent   Hayley Webb, you are scheduled for a virtual visit with a [facility] provider today. Just as with appointments in the office, your consent must be obtained to participate. Your consent will be active for this visit and any virtual visit you may have with one of our providers in the next 365 days. If you have a MyChart account, a copy of this consent can be sent to you electronically.  As this is a virtual visit, video technology does not allow for your provider to perform a traditional examination. This may limit your provider's ability to fully assess your condition. If your provider identifies any concerns that need to be evaluated in person or the need to arrange testing (such as labs, EKG, etc.), we will make arrangements to do so. Although advances in technology are sophisticated, we cannot ensure that it will always work on either your end or our end. If the connection with a video visit is poor, the visit may have to be switched to a telephone visit. With either a video or telephone visit, we are not always able to ensure that we have a secure connection.  By engaging in this virtual visit, you consent to the provision of healthcare and authorize for your insurance to be billed (if applicable) for the services provided during this visit. Depending on your insurance coverage, you may receive a charge related to this service.  I need to obtain your verbal consent now. Are you willing to proceed with your visit today? Hayley Webb has provided verbal consent on 06/19/2023 for a virtual visit (video or telephone). Hayley Webb, [place]  Date: 06/19/2023 5:06 PM  Virtual Visit via Video Note   I, Hayley Webb, connected with  Hayley Webb  ([id number], Jan 11, 1986) on 06/19/23 at  5:00 PM EST by a video-enabled telemedicine application and verified that I am speaking with the correct person using two identifiers.  Location: Patient: Virtual Visit Location  Patient: Home Provider: Virtual Visit Location Provider: Home Office   I discussed the limitations of evaluation and management by telemedicine and the availability of in person appointments. The patient expressed understanding and agreed to proceed.    History of Present Illness: Hayley Webb is a 36 y.o. who identifies as a female who was assigned female at birth, and is being seen today for URI symptoms starting last week with nasal congestion and chest congestion, progressing over past few days with sinus pressure and pain, along with thick nasal discharge. Still without substantial cough but when she coughs, she does get up some phlegm. Denies fever, chills, chest pain or SOB.  OTC -- Sudafed and Tylenol  HPI: HPI  Problems:  Patient Active Problem List   Diagnosis Date Noted   History of pre-eclampsia in prior pregnancy, currently pregnant 04/06/2023   Chronic hypertension during pregnancy, antepartum 04/06/2023   Preterm labor 12/23/2018   Depressive disorder 12/23/2018   Anxiety disorder 12/23/2018   Maternal obesity affecting pregnancy, antepartum 06/11/2018   On continuous oral anticoagulation 04/04/2017   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   Antiphospholipid antibody syndrome (HCC) 02/10/2017   History of pulmonary embolus (PE) 07/24/2016   Obesity-BMI 47 05/09/2013   Hirsutism 10/02/2012   Lupus    Asthma    HSV infection     Allergies:  Allergies  Allergen Reactions   Bactrim Anaphylaxis and Hives   Hydrocodone-Acetaminophen Anaphylaxis   Nifedipine Swelling and Anaphylaxis    Swelling of tongue   Pineapple Itching  Prednisone Anaphylaxis and Hives    Has been on Dexamethasone without issue.   Shrimp Extract Itching   Medications:  Current Outpatient Medications:    amoxicillin (AMOXIL) 875 MG tablet, Take 1 tablet (875 mg total) by mouth 2 (two) times daily for 10 days., Disp: 14 tablet, Rfl: 0   acetaminophen (TYLENOL) 325 MG tablet, Take 2  tablets (650 mg total) by mouth every 6 (six) hours as needed for headache or mild pain (for pain scale < 4  OR  temperature  >/=  100.5 F)., Disp: 60 tablet, Rfl: 3   cholecalciferol (VITAMIN D3) 25 MCG (1000 UNIT) tablet, Take 2,000 Units by mouth daily., Disp: , Rfl:    cyclobenzaprine (FLEXERIL) 10 MG tablet, Take 1 tablet (10 mg total) by mouth every 8 (eight) hours as needed for muscle spasms., Disp: 30 tablet, Rfl: 1   enoxaparin (LOVENOX) 150 MG/ML injection, Inject 1 mL (150 mg total) into the skin in the morning and at bedtime., Disp: 30 mL, Rfl: 1   EPINEPHrine 0.1 MG/0.1ML SOAJ, Inject 1 Dose as directed as needed (for anaphylaxis). (Patient not taking: Reported on 01/04/2022), Disp: 2 each, Rfl: 2   hydroxychloroquine (PLAQUENIL) 200 MG tablet, Take by mouth daily., Disp: , Rfl:    labetalol (NORMODYNE) 100 MG tablet, Take 100 mg by mouth 3 (three) times daily., Disp: , Rfl:    ondansetron (ZOFRAN-ODT) 4 MG disintegrating tablet, Take 1 tablet (4 mg total) by mouth every 6 (six) hours as needed for nausea., Disp: 20 tablet, Rfl: 0  Observations/Objective: Patient is well-developed, well-nourished in no acute distress.  Resting comfortably at home.  Head is normocephalic, atraumatic.  No labored breathing. Speech is clear and coherent with logical content.  Patient is alert and oriented at baseline.   Assessment and Plan: 1. Acute non-recurrent maxillary sinusitis - amoxicillin (AMOXIL) 875 MG tablet; Take 1 tablet (875 mg total) by mouth 2 (two) times daily for 10 days.  Dispense: 14 tablet; Refill: 0  Discussed viral versus bacterial etiology. Giving worsening and sinus pain, will start Amox per orders. Pregnancy-safe OTC medications reviewed. In-person follow-up for any non-resolving, new or worsening symptoms.  Follow Up Instructions: I discussed the assessment and treatment plan with the patient. The patient was provided an opportunity to ask questions and all were answered.  The patient agreed with the plan and demonstrated an understanding of the instructions.  A copy of instructions were sent to the patient via MyChart unless otherwise noted below.   The patient was advised to call back or seek an in-person evaluation if the symptoms worsen or if the condition fails to improve as anticipated.    Hayley Climes, PA-C

## 2023-06-19 NOTE — Patient Instructions (Signed)
Mindi Curling, thank you for joining Piedad Climes, PA-C for today's virtual visit.  While this provider is not your primary care provider (PCP), if your PCP is located in our provider database this encounter information will be shared with them immediately following your visit.   A [facility] MyChart account gives you access to today's visit and all your visits, tests, and labs performed at [facility] " click here if you don't have a [facility] MyChart account or go to [email or web address]  Consent: (Patient) Hayley Webb provided verbal consent for this virtual visit at the beginning of the encounter.  Current Medications:  Current Outpatient Medications:    acetaminophen (TYLENOL) 325 MG tablet, Take 2 tablets (650 mg total) by mouth every 6 (six) hours as needed for headache or mild pain (for pain scale < 4  OR  temperature  >/=  100.5 F)., Disp: 60 tablet, Rfl: 3   albuterol (PROVENTIL) (2.5 MG/3ML) 0.083% nebulizer solution, Take 3 mLs (2.5 mg total) by nebulization every 6 (six) hours as needed for wheezing or shortness of breath., Disp: 75 mL, Rfl: 0   albuterol (VENTOLIN HFA) 108 (90 Base) MCG/ACT inhaler, Inhale 2 puffs into the lungs every 6 (six) hours as needed for wheezing or shortness of breath., Disp: 18 g, Rfl: 0   cholecalciferol (VITAMIN D3) 25 MCG (1000 UNIT) tablet, Take 2,000 Units by mouth daily., Disp: , Rfl:    cyclobenzaprine (FLEXERIL) 10 MG tablet, Take 1 tablet (10 mg total) by mouth every 8 (eight) hours as needed for muscle spasms., Disp: 30 tablet, Rfl: 1   enoxaparin (LOVENOX) 150 MG/ML injection, Inject 1 mL (150 mg total) into the skin in the morning and at bedtime., Disp: 30 mL, Rfl: 1   EPINEPHrine 0.1 MG/0.1ML SOAJ, Inject 1 Dose as directed as needed (for anaphylaxis). (Patient not taking: Reported on 01/04/2022), Disp: 2 each, Rfl: 2   hydroxychloroquine (PLAQUENIL) 200 MG tablet, Take by mouth daily., Disp: , Rfl:    labetalol  (NORMODYNE) 100 MG tablet, Take 100 mg by mouth 3 (three) times daily., Disp: , Rfl:    ondansetron (ZOFRAN-ODT) 4 MG disintegrating tablet, Take 1 tablet (4 mg total) by mouth every 6 (six) hours as needed for nausea., Disp: 20 tablet, Rfl: 0   promethazine-dextromethorphan (PROMETHAZINE-DM) 6.25-15 MG/5ML syrup, Take 5 mLs by mouth 4 (four) times daily as needed., Disp: 118 mL, Rfl: 0   Medications ordered in this encounter:  No orders of the defined types were placed in this encounter.    *If you need refills on other medications prior to your next appointment, please contact your pharmacy*  Follow-Up: Call back or seek an in-person evaluation if the symptoms worsen or if the condition fails to improve as anticipated.  [facility] [phone]  Other Instructions Please take antibiotic as directed.  Increase fluid intake.  Use Saline nasal spray.  Take a daily multivitamin. Ok to continue .  Place a humidifier in the bedroom.  Please call or return clinic if symptoms are not improving.  Sinusitis Sinusitis is redness, soreness, and swelling (inflammation) of the paranasal sinuses. Paranasal sinuses are air pockets within the bones of your face (beneath the eyes, the middle of the forehead, or above the eyes). In healthy paranasal sinuses, mucus is able to drain out, and air is able to circulate through them by way of your nose. However, when your paranasal sinuses are inflamed, mucus and air can become trapped. This can allow  bacteria and other germs to grow and cause infection. Sinusitis can develop quickly and last only a short time (acute) or continue over a long period (chronic). Sinusitis that lasts for more than 12 weeks is considered chronic.  CAUSES  Causes of sinusitis include: Allergies. Structural abnormalities, such as displacement of the cartilage that separates your nostrils (deviated septum), which can decrease the air flow through your nose and sinuses and  affect sinus drainage. Functional abnormalities, such as when the small hairs (cilia) that line your sinuses and help remove mucus do not work properly or are not present. SYMPTOMS  Symptoms of acute and chronic sinusitis are the same. The primary symptoms are pain and pressure around the affected sinuses. Other symptoms include: Upper toothache. Earache. Headache. Bad breath. Decreased sense of smell and taste. A cough, which worsens when you are lying flat. Fatigue. Fever. Thick drainage from your nose, which often is green and may contain pus (purulent). Swelling and warmth over the affected sinuses. DIAGNOSIS  Your caregiver will perform a physical exam. During the exam, your caregiver may: Look in your nose for signs of abnormal growths in your nostrils (nasal polyps). Tap over the affected sinus to check for signs of infection. View the inside of your sinuses (endoscopy) with a special imaging device with a light attached (endoscope), which is inserted into your sinuses. If your caregiver suspects that you have chronic sinusitis, one or more of the following tests may be recommended: Allergy tests. Nasal culture A sample of mucus is taken from your nose and sent to a lab and screened for bacteria. Nasal cytology A sample of mucus is taken from your nose and examined by your caregiver to determine if your sinusitis is related to an allergy. TREATMENT  Most cases of acute sinusitis are related to a viral infection and will resolve on their own within 10 days. Sometimes medicines are prescribed to help relieve symptoms (pain medicine, decongestants, nasal steroid sprays, or saline sprays).  However, for sinusitis related to a bacterial infection, your caregiver will prescribe antibiotic medicines. These are medicines that will help kill the bacteria causing the infection.  Rarely, sinusitis is caused by a fungal infection. In theses cases, your caregiver will prescribe antifungal  medicine. For some cases of chronic sinusitis, surgery is needed. Generally, these are cases in which sinusitis recurs more than 3 times per year, despite other treatments. HOME CARE INSTRUCTIONS  Drink plenty of water. Water helps thin the mucus so your sinuses can drain more easily. Use a humidifier. Inhale steam 3 to 4 times a day (for example, sit in the bathroom with the shower running). Apply a warm, moist washcloth to your face 3 to 4 times a day, or as directed by your caregiver. Use saline nasal sprays to help moisten and clean your sinuses. Take over-the-counter or prescription medicines for pain, discomfort, or fever only as directed by your caregiver. SEEK IMMEDIATE MEDICAL CARE IF: You have increasing pain or severe headaches. You have nausea, vomiting, or drowsiness. You have swelling around your face. You have vision problems. You have a stiff neck. You have difficulty breathing. MAKE SURE YOU:  Understand these instructions. Will watch your condition. Will get help right away if you are not doing well or get worse. Document Released: 07/10/2005 Document Revised: 10/02/2011 Document Reviewed: 07/25/2011 [facility] Patient Information 2014 [place], [place].    If you have been instructed to have an in-person evaluation today at a local Urgent Care facility, please use the link  below. It will take you to a list of all of our available [facility], including address, phone number and hours of operation. Please do not delay care.  [facility]  If you or a family member do not have a primary care provider, use the link below to schedule a visit and establish care. When you choose a [facility] primary care physician or advanced practice provider, you gain a long-term partner in health. Find a Primary Care Provider  Learn more about [facility] in-office and virtual care options: [facility] - Get Care Now

## 2023-06-28 ENCOUNTER — Inpatient Hospital Stay (HOSPITAL_COMMUNITY)
Admission: AD | Admit: 2023-06-28 | Discharge: 2023-06-28 | Disposition: A | Discharge status: Home / Self Care | Payer: Medicaid Other | Attending: Obstetrics & Gynecology | Admitting: Obstetrics & Gynecology

## 2023-06-28 ENCOUNTER — Other Ambulatory Visit: Payer: Self-pay

## 2023-06-28 ENCOUNTER — Encounter (HOSPITAL_COMMUNITY): Payer: Self-pay | Admitting: Obstetrics & Gynecology

## 2023-06-28 DIAGNOSIS — R519 Headache, unspecified: Secondary | ICD-10-CM

## 2023-06-28 DIAGNOSIS — R109 Unspecified abdominal pain: Secondary | ICD-10-CM | POA: Diagnosis not present

## 2023-06-28 DIAGNOSIS — O26893 Other specified pregnancy related conditions, third trimester: Secondary | ICD-10-CM | POA: Insufficient documentation

## 2023-06-28 DIAGNOSIS — R102 Pelvic and perineal pain: Secondary | ICD-10-CM | POA: Insufficient documentation

## 2023-06-28 DIAGNOSIS — Z3A30 30 weeks gestation of pregnancy: Secondary | ICD-10-CM | POA: Diagnosis not present

## 2023-06-28 DIAGNOSIS — O26899 Other specified pregnancy related conditions, unspecified trimester: Secondary | ICD-10-CM

## 2023-06-28 LAB — URINALYSIS, ROUTINE W REFLEX MICROSCOPIC
Bilirubin Urine: NEGATIVE
Glucose, UA: NEGATIVE mg/dL
Hgb urine dipstick: NEGATIVE
Ketones, ur: 20 mg/dL — AB
Leukocytes,Ua: NEGATIVE
Nitrite: NEGATIVE
Protein, ur: 30 mg/dL — AB
Specific Gravity, Urine: 1.024 (ref 1.005–1.030)
pH: 6 (ref 5.0–8.0)

## 2023-06-28 LAB — WET PREP, GENITAL
Clue Cells Wet Prep HPF POC: NONE SEEN
Sperm: NONE SEEN
Trich, Wet Prep: NONE SEEN
WBC, Wet Prep HPF POC: <10 (ref <10)
Yeast Wet Prep HPF POC: NONE SEEN

## 2023-06-28 MED ORDER — LACTATED RINGERS IV BOLUS
1000.0000 mL | Freq: Once | INTRAVENOUS | Status: AC
  Administered 2023-06-28: 1000 mL via INTRAVENOUS

## 2023-06-28 MED ORDER — DIPHENHYDRAMINE HCL 50 MG/ML IJ SOLN
25.0000 mg | Freq: Once | INTRAMUSCULAR | Status: AC
  Administered 2023-06-28: 25 mg via INTRAVENOUS
  Filled 2023-06-28: qty 1

## 2023-06-28 MED ORDER — CYCLOBENZAPRINE HCL 5 MG PO TABS
10.0000 mg | ORAL_TABLET | Freq: Once | ORAL | Status: AC
  Administered 2023-06-28: 10 mg via ORAL
  Filled 2023-06-28: qty 2

## 2023-06-28 MED ORDER — METOCLOPRAMIDE HCL 5 MG/ML IJ SOLN
10.0000 mg | Freq: Once | INTRAMUSCULAR | Status: AC
  Administered 2023-06-28: 10 mg via INTRAVENOUS
  Filled 2023-06-28: qty 2

## 2023-06-28 MED ORDER — ACETAMINOPHEN 500 MG PO TABS
1000.0000 mg | ORAL_TABLET | Freq: Once | ORAL | Status: AC
  Administered 2023-06-28: 1000 mg via ORAL
  Filled 2023-06-28: qty 2

## 2023-06-28 NOTE — MAU Note (Signed)
Hayley Webb is a 37 y.o. at [redacted]w[redacted]d here in MAU reporting: she's been having increased pelvic pressure and cramping since Monday and a consistent HA since Tuesday.  Reports she's taken both Tylenol & Percocet for HA and it's not relieved.  Denies VB or LOF.  Endorses +FM. LMP: NA Onset of complaint: Monday Pain score: 7 HA & 8 cramping Vitals:   06/28/23 1729  BP: 139/86  Pulse: (!) 102  Resp: 18  Temp: 97.7 F (36.5 C)  SpO2: 99%     FHT:133 bpm Lab orders placed from triage:   UA

## 2023-06-28 NOTE — MAU Provider Note (Signed)
History     CSN: [id number]  Arrival date and time: 06/28/23 1657   Event Date/Time   First Provider Initiated Contact with Patient 06/28/23 1811      Chief Complaint  Patient presents with   Pelvic Pressure   Headache   Cramping   Hayley Webb is a 37 y.o. [id number] at [redacted]w[redacted]d who receives care at [facility].  She reports she had an appt on Monday and her next is Dec 17th.  She presents today for cramping, pelvic pressure, and HA.  She reports cramping and pressure since Monday that are not consistent, but have no relieving or worsening factors.  She rates it a 8/1. She endorses good fetal movement and denies vaginal concerns.  She states she has had a HA since Tuesday that she describes as "pressure" in frontal area.  She reports it has no relieving or worsening contributing factors and rates it a 9/10.  She reports taking tylenol and percocet without relief.  Last dose of tylenol was at 12. She also reports left side "squeezing" pain that lasts 30-60 minutes.  She is unsure if it is due to fetus. She reports it is improved with laying.   OB History     Gravida  6   Para  3   Term  1   Preterm  2   AB  2   Living  3      SAB  2   IAB      Ectopic      Multiple  0   Live Births  3           Past Medical History:  Diagnosis Date   Allergy to shellfish 06/11/2018   Altered thought processes 04/04/2017   r/o embolic event     Anxiety    doing good now   Asthma    Chest pain 04/04/2017   Chronic hypertension affecting pregnancy 07/25/2018   Chronic hypertension complicating or reason for care during pregnancy, third trimester 01/12/2019   COVID 07/2021   mild   Depression    doing good   Headache(784.0)    HSV infection    Hypertension    was taken off meds after last preg   Infection    UTI   Knee pain 01/12/2020   Left knee pain 10/20/2020   Lupus    dx age 69   Nonintractable headache 04/04/2017   Normal postpartum course 01/17/2019    Pneumonia    blood clot appeared during the pneumonia   Postpartum anemia 01/17/2019   Preeclampsia 01/25/2022   Pregnant 07/01/2018   Pulmonary embolism (HCC) 06/4/82011   Pulmonary embolus (HCC) 02/09/2017   Right knee pain 10/12/2020   Shortness of breath    Sicca syndrome (HCC) 08/27/2018   STD (female) 01/18/2012   Hx GC 8/10  Hx Chlamydia 6/13     STD (sexually transmitted disease) 02/2009   POSITIVE GC   Threatened preterm labor 12/25/2018   Threatened preterm labor, third trimester 12/23/2018   UTI (urinary tract infection)    Word finding difficulty 04/04/2017    Past Surgical History:  Procedure Laterality Date   ADENOIDECTOMY     TONSILLECTOMY AND ADENOIDECTOMY  1998   WISDOM TOOTH EXTRACTION      Family History  Problem Relation Age of Onset   Asthma Mother    Healthy Father    Asthma Brother    Diabetes Maternal Aunt    Lupus Paternal Aunt    Stroke  Paternal Aunt    Cancer Maternal Grandmother 49       COLON CA   Diabetes Maternal Grandmother    Hypertension Maternal Grandfather    Cancer Maternal Grandfather        prostate   Cancer Paternal Grandmother        breast   Lupus Paternal Grandmother    Anesthesia problems Neg Hx    Hypotension Neg Hx    Malignant hyperthermia Neg Hx    Pseudochol deficiency Neg Hx     Social History   Tobacco Use   Smoking status: Never   Smokeless tobacco: Never  Vaping Use   Vaping status: Never Used  Substance Use Topics   Alcohol use: Not Currently    Comment: occsinally   Drug use: No    Allergies:  Allergies  Allergen Reactions   Bactrim Anaphylaxis and Hives   Hydrocodone-Acetaminophen Anaphylaxis   Nifedipine Swelling and Anaphylaxis    Swelling of tongue   Pineapple Itching   Prednisone Anaphylaxis and Hives    Has been on Dexamethasone without issue.   Shrimp Extract Itching    Medications Prior to Admission  Medication Sig Dispense Refill Last Dose   acetaminophen (TYLENOL) 325 MG  tablet Take 2 tablets (650 mg total) by mouth every 6 (six) hours as needed for headache or mild pain (for pain scale < 4  OR  temperature  >/=  100.5 F). 60 tablet 3 06/28/2023 at 1200   cyclobenzaprine (FLEXERIL) 10 MG tablet Take 1 tablet (10 mg total) by mouth every 8 (eight) hours as needed for muscle spasms. 30 tablet 1 Past Week   enoxaparin (LOVENOX) 150 MG/ML injection Inject 1 mL (150 mg total) into the skin in the morning and at bedtime. 30 mL 1 06/28/2023   labetalol (NORMODYNE) 100 MG tablet Take 100 mg by mouth 3 (three) times daily.   06/28/2023   oxyCODONE-acetaminophen (PERCOCET/ROXICET) 5-325 MG tablet Take by mouth every 4 (four) hours as needed for severe pain (pain score 7-10).   06/28/2023 at 0300   amoxicillin (AMOXIL) 875 MG tablet Take 1 tablet (875 mg total) by mouth 2 (two) times daily for 10 days. 14 tablet 0    cholecalciferol (VITAMIN D3) 25 MCG (1000 UNIT) tablet Take 2,000 Units by mouth daily.      EPINEPHrine 0.1 MG/0.1ML SOAJ Inject 1 Dose as directed as needed (for anaphylaxis). (Patient not taking: Reported on 01/04/2022) 2 each 2    hydroxychloroquine (PLAQUENIL) 200 MG tablet Take by mouth daily.      ondansetron (ZOFRAN-ODT) 4 MG disintegrating tablet Take 1 tablet (4 mg total) by mouth every 6 (six) hours as needed for nausea. 20 tablet 0     Review of Systems  Eyes:  Positive for visual disturbance (Blurry).  Gastrointestinal:  Positive for abdominal pain and vomiting (2pm). Negative for constipation, diarrhea and nausea.  Genitourinary:  Negative for difficulty urinating, dysuria, vaginal bleeding and vaginal discharge.  Neurological:  Positive for dizziness and headaches. Negative for light-headedness.   Physical Exam   Blood pressure 134/83, pulse (!) 119, temperature 97.7 F (36.5 C), temperature source Oral, resp. rate 18, height 5' 8.5" (1.74 m), weight (!) 147.9 kg, last menstrual period 05/04/2021, SpO2 97%, currently breastfeeding.  Physical  Exam Vitals reviewed. Exam conducted with a chaperone present Josephine Igo, RN).  Constitutional:      Appearance: Normal appearance. She is well-developed.  HENT:     Head: Normocephalic and atraumatic.  Eyes:  Conjunctiva/sclera: Conjunctivae normal.  Cardiovascular:     Rate and Rhythm: Normal rate.     Heart sounds: Normal heart sounds.  Pulmonary:     Effort: Pulmonary effort is normal.  Abdominal:     Palpations: Abdomen is soft.  Genitourinary:    General: Normal vulva.     Comments: Cultures collected blindly Cervix closed, soft, and posterior  Musculoskeletal:        General: Normal range of motion.     Cervical back: Normal range of motion.  Skin:    General: Skin is warm and dry.  Neurological:     Mental Status: She is alert and oriented to person, place, and time.  Psychiatric:        Mood and Affect: Mood normal.        Behavior: Behavior normal.     Fetal Assessment 130 bpm, Mod Var, -Decels, (1) 15x15 Accels Toco: No ctx graphed  MAU Course   Results for orders placed or performed during the hospital encounter of 06/28/23 (from the past 24 hour(s))  Urinalysis, Routine w reflex microscopic -Urine, Clean Catch     Status: Abnormal   Collection Time: 06/28/23  6:25 PM  Result Value Ref Range   Color, Urine AMBER (A) YELLOW   APPearance CLEAR CLEAR   Specific Gravity, Urine 1.024 1.005 - 1.030   pH 6.0 5.0 - 8.0   Glucose, UA NEGATIVE NEGATIVE mg/dL   Hgb urine dipstick NEGATIVE NEGATIVE   Bilirubin Urine NEGATIVE NEGATIVE   Ketones, ur 20 (A) NEGATIVE mg/dL   Protein, ur 30 (A) NEGATIVE mg/dL   Nitrite NEGATIVE NEGATIVE   Leukocytes,Ua NEGATIVE NEGATIVE   RBC / HPF 0-5 0 - 5 RBC/hpf   WBC, UA 0-5 0 - 5 WBC/hpf   Bacteria, UA RARE (A) NONE SEEN   Squamous Epithelial / HPF 0-5 0 - 5 /HPF   Mucus PRESENT   Wet prep, genital     Status: None   Collection Time: 06/28/23  6:25 PM   Specimen: Vaginal  Result Value Ref Range   Yeast Wet Prep HPF  POC NONE SEEN NONE SEEN   Trich, Wet Prep NONE SEEN NONE SEEN   Clue Cells Wet Prep HPF POC NONE SEEN NONE SEEN   WBC, Wet Prep HPF POC <10 <10   Sperm NONE SEEN    No results found.  MDM PE Labs: UA Cultures: Wet Prep, GC/CT EFM Assessment and Plan  37 year old [id number]  SIUP at 30.2 weeks Cat I FT Headache Abdominal Cramping Pelvic Pain  -POC Reviewed -Exam performed and findings discussed. -Patient giving option of oral vs IV medication. Requests IV. -Patient offered vaginal exam and agreeable. -Cultures to be collected. -Start IV and given HA cocktail f/b Flexeril. -Monitor and reassess.   Cherre Robins MSN, [name] 06/28/2023, 6:11 PM   Reassessment (7:51 PM) -Nurse reports that patient sleeping and reports HA has subsided.  -Patient states cramping remains and requests additional medication. -Tylenol ordered.  -Provider to bedside to reassess. -Informed that cramping may not subside.  Discussed UA and encouraged increased intake. -Patient agreeable and without questions. -Precautions reviewed. -Encouraged to call primary office or return to MAU if symptoms worsen or with the onset of new symptoms. -Discharged to home in stable condition.  Cherre Robins MSN, [name] Advanced Practice Provider, [facility]

## 2023-06-29 LAB — GC/CHLAMYDIA PROBE AMP (~~LOC~~) NOT AT ARMC
Chlamydia: NEGATIVE
Comment: NEGATIVE
Comment: NORMAL
Neisseria Gonorrhea: NEGATIVE

## 2023-07-04 ENCOUNTER — Encounter: Payer: Self-pay | Admitting: *Deleted

## 2023-07-09 ENCOUNTER — Inpatient Hospital Stay (HOSPITAL_COMMUNITY)
Admission: AD | Admit: 2023-07-09 | Discharge: 2023-07-09 | Disposition: A | Discharge status: Home / Self Care | Payer: Medicaid Other | Attending: Obstetrics and Gynecology | Admitting: Obstetrics and Gynecology

## 2023-07-09 ENCOUNTER — Encounter (HOSPITAL_COMMUNITY): Payer: Self-pay | Admitting: Obstetrics and Gynecology

## 2023-07-09 DIAGNOSIS — O98513 Other viral diseases complicating pregnancy, third trimester: Secondary | ICD-10-CM | POA: Diagnosis not present

## 2023-07-09 DIAGNOSIS — O99213 Obesity complicating pregnancy, third trimester: Secondary | ICD-10-CM | POA: Diagnosis not present

## 2023-07-09 DIAGNOSIS — Z3A31 31 weeks gestation of pregnancy: Secondary | ICD-10-CM | POA: Diagnosis not present

## 2023-07-09 DIAGNOSIS — O09523 Supervision of elderly multigravida, third trimester: Secondary | ICD-10-CM | POA: Diagnosis not present

## 2023-07-09 DIAGNOSIS — O10913 Unspecified pre-existing hypertension complicating pregnancy, third trimester: Secondary | ICD-10-CM | POA: Diagnosis present

## 2023-07-09 DIAGNOSIS — O99513 Diseases of the respiratory system complicating pregnancy, third trimester: Secondary | ICD-10-CM | POA: Insufficient documentation

## 2023-07-09 DIAGNOSIS — O4703 False labor before 37 completed weeks of gestation, third trimester: Secondary | ICD-10-CM

## 2023-07-09 DIAGNOSIS — O26893 Other specified pregnancy related conditions, third trimester: Secondary | ICD-10-CM | POA: Insufficient documentation

## 2023-07-09 DIAGNOSIS — R519 Headache, unspecified: Secondary | ICD-10-CM | POA: Insufficient documentation

## 2023-07-09 DIAGNOSIS — O09293 Supervision of pregnancy with other poor reproductive or obstetric history, third trimester: Secondary | ICD-10-CM | POA: Insufficient documentation

## 2023-07-09 DIAGNOSIS — O10919 Unspecified pre-existing hypertension complicating pregnancy, unspecified trimester: Secondary | ICD-10-CM

## 2023-07-09 LAB — URINALYSIS, ROUTINE W REFLEX MICROSCOPIC
Bilirubin Urine: NEGATIVE
Glucose, UA: NEGATIVE mg/dL
Hgb urine dipstick: NEGATIVE
Ketones, ur: 5 mg/dL — AB
Nitrite: NEGATIVE
Protein, ur: NEGATIVE mg/dL
Specific Gravity, Urine: 1.015 (ref 1.005–1.030)
pH: 6 (ref 5.0–8.0)

## 2023-07-09 LAB — COMPREHENSIVE METABOLIC PANEL
ALT: 14 U/L (ref 0–44)
AST: 18 U/L (ref 15–41)
Albumin: 2.6 g/dL — ABNORMAL LOW (ref 3.5–5.0)
Alkaline Phosphatase: 110 U/L (ref 38–126)
Anion gap: 11 (ref 5–15)
BUN: <5 mg/dL — ABNORMAL LOW (ref 6–20)
CO2: 16 mmol/L — ABNORMAL LOW (ref 22–32)
Calcium: 9 mg/dL (ref 8.9–10.3)
Chloride: 105 mmol/L (ref 98–111)
Creatinine, Ser: 0.5 mg/dL (ref 0.44–1.00)
GFR, Estimated: >60 mL/min (ref >60)
Glucose, Bld: 102 mg/dL — ABNORMAL HIGH (ref 70–99)
Potassium: 3.3 mmol/L — ABNORMAL LOW (ref 3.5–5.1)
Sodium: 132 mmol/L — ABNORMAL LOW (ref 135–145)
Total Bilirubin: 0.5 mg/dL (ref <1.2)
Total Protein: 6.4 g/dL — ABNORMAL LOW (ref 6.5–8.1)

## 2023-07-09 LAB — CBC
HCT: 33.8 % — ABNORMAL LOW (ref 36.0–46.0)
Hemoglobin: 10.9 g/dL — ABNORMAL LOW (ref 12.0–15.0)
MCH: 27 pg (ref 26.0–34.0)
MCHC: 32.2 g/dL (ref 30.0–36.0)
MCV: 83.7 fL (ref 80.0–100.0)
Platelets: 286 10*3/uL (ref 150–400)
RBC: 4.04 MIL/uL (ref 3.87–5.11)
RDW: 15.3 % (ref 11.5–15.5)
WBC: 4.7 10*3/uL (ref 4.0–10.5)
nRBC: 0 % (ref 0.0–0.2)

## 2023-07-09 LAB — PROTEIN / CREATININE RATIO, URINE
Creatinine, Urine: 196 mg/dL
Protein Creatinine Ratio: 0.12 mg/mg{creat} (ref 0.00–0.15)
Total Protein, Urine: 24 mg/dL

## 2023-07-09 LAB — FETAL FIBRONECTIN: Fetal Fibronectin: NEGATIVE

## 2023-07-09 MED ORDER — DIPHENHYDRAMINE HCL 50 MG/ML IJ SOLN
25.0000 mg | INTRAMUSCULAR | Status: AC
  Administered 2023-07-09: 25 mg via INTRAVENOUS
  Filled 2023-07-09: qty 1

## 2023-07-09 MED ORDER — LABETALOL HCL 5 MG/ML IV SOLN
20.0000 mg | INTRAVENOUS | Status: DC | PRN
Start: 2023-07-09 — End: 2023-07-09

## 2023-07-09 MED ORDER — CYCLOBENZAPRINE HCL 5 MG PO TABS
10.0000 mg | ORAL_TABLET | Freq: Once | ORAL | Status: AC
  Administered 2023-07-09: 10 mg via ORAL
  Filled 2023-07-09: qty 2

## 2023-07-09 MED ORDER — HYDRALAZINE HCL 20 MG/ML IJ SOLN
10.0000 mg | INTRAMUSCULAR | Status: DC | PRN
Start: 2023-07-09 — End: 2023-07-09

## 2023-07-09 MED ORDER — LABETALOL HCL 5 MG/ML IV SOLN: 80.0000 mg | INTRAVENOUS | Status: DC | PRN

## 2023-07-09 MED ORDER — LACTATED RINGERS IV BOLUS
1000.0000 mL | Freq: Once | INTRAVENOUS | Status: AC
  Administered 2023-07-09: 1000 mL via INTRAVENOUS

## 2023-07-09 MED ORDER — LABETALOL HCL 5 MG/ML IV SOLN
40.0000 mg | INTRAVENOUS | Status: DC | PRN
Start: 2023-07-09 — End: 2023-07-09

## 2023-07-09 MED ORDER — TERBUTALINE SULFATE 1 MG/ML IJ SOLN
0.2500 mg | Freq: Once | INTRAMUSCULAR | Status: AC
  Administered 2023-07-09: 0.25 mg via SUBCUTANEOUS
  Filled 2023-07-09: qty 1

## 2023-07-09 MED ORDER — PROCHLORPERAZINE EDISYLATE 10 MG/2ML IJ SOLN
10.0000 mg | Freq: Once | INTRAMUSCULAR | Status: AC
  Administered 2023-07-09: 10 mg via INTRAVENOUS
  Filled 2023-07-09: qty 2

## 2023-07-09 MED ORDER — ACETAMINOPHEN-CAFFEINE 500-65 MG PO TABS
2.0000 | ORAL_TABLET | Freq: Once | ORAL | Status: AC
  Administered 2023-07-09: 2 via ORAL
  Filled 2023-07-09: qty 2

## 2023-07-09 MED ORDER — CYCLOBENZAPRINE HCL 10 MG PO TABS: 10.0000 mg | ORAL_TABLET | Freq: Two times a day (BID) | ORAL | 0 refills | Status: DC | PRN

## 2023-07-09 NOTE — MAU Note (Signed)
.  Hayley Webb is a 37 y.o. at [redacted]w[redacted]d here in MAU reporting: contractions that started this morning around 0500. She states they are now every 2-3 minutes. Denies VB or LOF. +FM. Patient also reports a headache and blurred vision, denies epigastric pain.  Pain score: ctx- 9     headache-6 Vitals:   07/09/23 1015  BP: (!) 134/92  Pulse: (!) 104  Resp: 20  Temp: 97.6 F (36.4 C)  SpO2: 96%     FHT:135 Lab orders placed from triage:   UA

## 2023-07-09 NOTE — MAU Provider Note (Signed)
History     CSN: [id number]  Arrival date and time: 07/09/23 2536   Event Date/Time   First Provider Initiated Contact with Patient 07/09/23 1036      Chief Complaint  Patient presents with   Contractions   HPI [name] is a 37 y.o. year old [phone] female at [redacted]w[redacted]d weeks gestation who was sent to MAU by her OB office with reports of contractions every 2-3 mins since 0500 this morning; rated 9/10. She also reports H/a with blurry vision; rated 6/10. She denies epigastric pain. She denies VB or LOF. She report (+) FM. She has cHTN on Labetalol 100 mg TID; last dose @ 0800. 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 She receives [facility] with [facility]; next appt is this week.    OB History     Gravida  6   Para  3   Term  1   Preterm  2   AB  2   Living  3      SAB  2   IAB      Ectopic      Multiple  0   Live Births  3           Past Medical History:  Diagnosis Date   Allergy to shellfish 06/11/2018   Altered thought processes 04/04/2017   r/o embolic event     Anxiety    doing good now   Anxiety disorder 12/23/2018   Asthma    Chest pain 04/04/2017   Chronic hypertension affecting pregnancy 07/25/2018   Chronic hypertension complicating or reason for care during pregnancy, third trimester 01/12/2019   COVID 07/2021   mild   Depression    doing good   Depressive disorder 12/23/2018   Headache(784.0)    Hirsutism 10/02/2012   HSV infection    Hypertension    was taken off meds after last preg   Infection    UTI   Knee pain 01/12/2020   Left knee pain 10/20/2020   Lupus    dx age 59   Nonintractable headache 04/04/2017   Normal postpartum course 01/17/2019   Obesity-BMI 47 05/09/2013   Pneumonia    blood clot appeared during the pneumonia   Postpartum anemia 01/17/2019   Preeclampsia 01/25/2022   Pregnant 07/01/2018   Pulmonary embolism (HCC) 06/4/82011   Pulmonary embolus (HCC) 02/09/2017   Right knee pain 10/12/2020    Shortness of breath    Sicca syndrome (HCC) 08/27/2018   STD (female) 01/18/2012   Hx GC 8/10  Hx Chlamydia 6/13     STD (sexually transmitted disease) 02/2009   POSITIVE GC   Threatened preterm labor 12/25/2018   Threatened preterm labor, third trimester 12/23/2018   UTI (urinary tract infection)    Word finding difficulty 04/04/2017    Past Surgical History:  Procedure Laterality Date   ADENOIDECTOMY     TONSILLECTOMY AND ADENOIDECTOMY  1998   WISDOM TOOTH EXTRACTION      Family History  Problem Relation Age of Onset   Asthma Mother    Healthy Father    Asthma Brother    Diabetes Maternal Aunt    Lupus Paternal Aunt    Stroke Paternal Aunt    Cancer Maternal Grandmother 66       COLON CA   Diabetes Maternal Grandmother    Hypertension Maternal Grandfather    Cancer Maternal Grandfather        prostate   Cancer Paternal Grandmother  breast   Lupus Paternal Grandmother    Anesthesia problems Neg Hx    Hypotension Neg Hx    Malignant hyperthermia Neg Hx    Pseudochol deficiency Neg Hx     Social History   Tobacco Use   Smoking status: Never   Smokeless tobacco: Never  Vaping Use   Vaping status: Never Used  Substance Use Topics   Alcohol use: Not Currently    Comment: occsinally   Drug use: No    Allergies:  Allergies  Allergen Reactions   Bactrim Anaphylaxis and Hives   Hydrocodone-Acetaminophen Anaphylaxis   Nifedipine Swelling and Anaphylaxis    Swelling of tongue   Pineapple Itching   Prednisone Anaphylaxis and Hives    Has been on Dexamethasone without issue.   Shrimp Extract Itching    Medications Prior to Admission  Medication Sig Dispense Refill Last Dose/Taking   cholecalciferol (VITAMIN D3) 25 MCG (1000 UNIT) tablet Take 2,000 Units by mouth daily.   07/08/2023   cyclobenzaprine (FLEXERIL) 10 MG tablet Take 1 tablet (10 mg total) by mouth every 8 (eight) hours as needed for muscle spasms. 30 tablet 1 Past Week   enoxaparin  (LOVENOX) 150 MG/ML injection Inject 1 mL (150 mg total) into the skin in the morning and at bedtime. 30 mL 1 07/08/2023   ferrous sulfate 325 (65 FE) MG tablet Take 325 mg by mouth daily with breakfast.   07/08/2023   hydroxychloroquine (PLAQUENIL) 200 MG tablet Take by mouth daily.   07/08/2023   labetalol (NORMODYNE) 100 MG tablet Take 100 mg by mouth 3 (three) times daily.   07/09/2023 at  7:00 AM   oxyCODONE-acetaminophen (PERCOCET/ROXICET) 5-325 MG tablet Take by mouth every 4 (four) hours as needed for severe pain (pain score 7-10).   Past Week   acetaminophen (TYLENOL) 325 MG tablet Take 2 tablets (650 mg total) by mouth every 6 (six) hours as needed for headache or mild pain (for pain scale < 4  OR  temperature  >/=  100.5 F). 60 tablet 3    EPINEPHrine 0.1 MG/0.1ML SOAJ Inject 1 Dose as directed as needed (for anaphylaxis). (Patient not taking: Reported on 01/04/2022) 2 each 2    ondansetron (ZOFRAN-ODT) 4 MG disintegrating tablet Take 1 tablet (4 mg total) by mouth every 6 (six) hours as needed for nausea. 20 tablet 0     Review of Systems  Constitutional: Negative.   HENT: Negative.    Eyes: Negative.   Respiratory: Negative.    Cardiovascular: Negative.   Gastrointestinal: Negative.   Endocrine: Negative.   Genitourinary:  Positive for pelvic pain (contractions every 2-3 mins).  Musculoskeletal: Negative.   Skin: Negative.   Allergic/Immunologic: Negative.   Neurological:  Positive for headaches.  Hematological: Negative.   Psychiatric/Behavioral: Negative.     Physical Exam   Patient Vitals for the past 24 hrs:  BP Temp Temp src Pulse Resp SpO2 Weight  07/09/23 1431 129/60 -- -- 92 -- -- --  07/09/23 1416 127/64 -- -- 98 -- -- --  07/09/23 1401 123/71 -- -- 99 -- -- --  07/09/23 1346 131/75 -- -- (!) 101 -- -- --  07/09/23 1331 (!) 146/88 -- -- (!) 105 -- -- --  07/09/23 1316 (!) 140/73 -- -- (!) 102 -- -- --  07/09/23 1301 139/81 -- -- 98 -- -- --  07/09/23 1246 (!)  147/73 -- -- 98 -- -- --  07/09/23 1231 138/76 -- -- (!) 102 -- -- --  07/09/23 1201 (!) 147/95 -- -- (!) 107 -- -- --  07/09/23 1146 (!) 152/96 -- -- 92 -- -- --  07/09/23 1131 (!) 144/93 -- -- 89 -- -- --  07/09/23 1122 135/87 -- -- 97 -- -- --  07/09/23 1015 (!) 134/92 97.6 F (36.4 C) Oral (!) 104 20 96 % --  07/09/23 1008 -- -- -- -- -- -- (!) 138.8 kg     Physical Exam Vitals and nursing note reviewed. Exam conducted with a chaperone present.  Constitutional:      Appearance: Normal appearance. She is obese.  HENT:     Head: Normocephalic and atraumatic.  Cardiovascular:     Rate and Rhythm: Tachycardia present.  Pulmonary:     Effort: Pulmonary effort is normal.  Abdominal:     Palpations: Abdomen is soft.  Genitourinary:    General: Normal vulva.     Comments: fFN Swab collected by [name] using blind swab technique  Neurological:     Mental Status: She is alert and oriented to person, place, and time.  Psychiatric:        Mood and Affect: Mood normal.        Behavior: Behavior normal.        Thought Content: Thought content normal.        Judgment: Judgment normal.     MAU Course  Procedures  MDM CCUA fFN Excedrin Tension Headache 2 caplets -- no relief Benadryl 25 mg IVP -- no relief Compazine 10 mg IVP -- no relief Flexeril 10 mg po -- relieved H/A Terbutaline 0.25 mg SQ  Results for orders placed or performed during the hospital encounter of 07/09/23 (from the past 24 hours)  Urinalysis, Routine w reflex microscopic -Urine, Clean Catch     Status: Abnormal   Collection Time: 07/09/23 10:01 AM  Result Value Ref Range   Color, Urine YELLOW YELLOW   APPearance HAZY (A) CLEAR   Specific Gravity, Urine 1.015 1.005 - 1.030   pH 6.0 5.0 - 8.0   Glucose, UA NEGATIVE NEGATIVE mg/dL   Hgb urine dipstick NEGATIVE NEGATIVE   Bilirubin Urine NEGATIVE NEGATIVE   Ketones, ur 5 (A) NEGATIVE mg/dL   Protein, ur NEGATIVE NEGATIVE mg/dL   Nitrite NEGATIVE NEGATIVE    Leukocytes,Ua MODERATE (A) NEGATIVE   RBC / HPF 0-5 0 - 5 RBC/hpf   WBC, UA 6-10 0 - 5 WBC/hpf   Bacteria, UA FEW (A) NONE SEEN   Squamous Epithelial / HPF 11-20 0 - 5 /HPF   Mucus PRESENT   Protein / creatinine ratio, urine     Status: None   Collection Time: 07/09/23 10:01 AM  Result Value Ref Range   Creatinine, Urine 196 mg/dL   Total Protein, Urine 24 mg/dL   Protein Creatinine Ratio 0.12 0.00 - 0.15 mg/mg[Cre]  Fetal fibronectin     Status: None   Collection Time: 07/09/23 10:41 AM  Result Value Ref Range   Fetal Fibronectin NEGATIVE NEGATIVE  Comprehensive metabolic panel     Status: Abnormal   Collection Time: 07/09/23 10:56 AM  Result Value Ref Range   Sodium 132 (L) 135 - 145 mmol/L   Potassium 3.3 (L) 3.5 - 5.1 mmol/L   Chloride 105 98 - 111 mmol/L   CO2 16 (L) 22 - 32 mmol/L   Glucose, Bld 102 (H) 70 - 99 mg/dL   BUN <5 (L) 6 - 20 mg/dL   Creatinine, Ser 0.86 0.44 - 1.00 mg/dL   Calcium 9.0  8.9 - 10.3 mg/dL   Total Protein 6.4 (L) 6.5 - 8.1 g/dL   Albumin 2.6 (L) 3.5 - 5.0 g/dL   AST 18 15 - 41 U/L   ALT 14 0 - 44 U/L   Alkaline Phosphatase 110 38 - 126 U/L   Total Bilirubin 0.5 <1.2 mg/dL   GFR, Estimated >14 >78 mL/min   Anion gap 11 5 - 15  CBC     Status: Abnormal   Collection Time: 07/09/23 10:56 AM  Result Value Ref Range   WBC 4.7 4.0 - 10.5 K/uL   RBC 4.04 3.87 - 5.11 MIL/uL   Hemoglobin 10.9 (L) 12.0 - 15.0 g/dL   HCT 29.5 (L) 62.1 - 30.8 %   MCV 83.7 80.0 - 100.0 fL   MCH 27.0 26.0 - 34.0 pg   MCHC 32.2 30.0 - 36.0 g/dL   RDW 65.7 84.6 - 96.2 %   Platelets 286 150 - 400 K/uL   nRBC 0.0 0.0 - 0.2 %     Assessment and Plan  1. Preterm uterine contractions in third trimester, antepartum (Primary) - Information provided on PTL and preventing PTB   2. Headache in pregnancy, antepartum, third trimester - Prescription for: Flexeril 10 mg po BID prn  - Information provided on general H/A and a form to record a H/A   3. Chronic hypertension  affecting pregnancy - Continue medications as previously prescribed  4. [redacted] weeks gestation of pregnancy   - Discharge patient - Keep scheduled appt with [facility] this week - Patient verbalized an understanding of the plan of care and agrees.   Raelyn Mora, [name] 07/09/2023, 10:44 AM

## 2023-07-10 ENCOUNTER — Ambulatory Visit: Payer: Medicaid Other | Attending: Obstetrics

## 2023-07-10 DIAGNOSIS — O88213 Thromboembolism in pregnancy, third trimester: Secondary | ICD-10-CM

## 2023-07-10 DIAGNOSIS — O09523 Supervision of elderly multigravida, third trimester: Secondary | ICD-10-CM | POA: Diagnosis present

## 2023-07-10 DIAGNOSIS — Z3A32 32 weeks gestation of pregnancy: Secondary | ICD-10-CM

## 2023-07-10 DIAGNOSIS — O10013 Pre-existing essential hypertension complicating pregnancy, third trimester: Secondary | ICD-10-CM | POA: Diagnosis not present

## 2023-07-10 DIAGNOSIS — O99213 Obesity complicating pregnancy, third trimester: Secondary | ICD-10-CM | POA: Insufficient documentation

## 2023-07-10 DIAGNOSIS — E669 Obesity, unspecified: Secondary | ICD-10-CM

## 2023-07-10 DIAGNOSIS — O26893 Other specified pregnancy related conditions, third trimester: Secondary | ICD-10-CM

## 2023-07-10 DIAGNOSIS — O09293 Supervision of pregnancy with other poor reproductive or obstetric history, third trimester: Secondary | ICD-10-CM

## 2023-07-10 DIAGNOSIS — M329 Systemic lupus erythematosus, unspecified: Secondary | ICD-10-CM

## 2023-07-19 ENCOUNTER — Ambulatory Visit: Payer: Medicaid Other

## 2023-07-19 ENCOUNTER — Other Ambulatory Visit: Payer: Medicaid Other

## 2023-07-20 ENCOUNTER — Other Ambulatory Visit: Payer: Self-pay

## 2023-07-20 ENCOUNTER — Inpatient Hospital Stay (HOSPITAL_COMMUNITY)
Admission: AD | Admit: 2023-07-20 | Discharge: 2023-07-28 | DRG: 806 | Disposition: A | Discharge status: Home / Self Care | Payer: Medicaid Other | Attending: Obstetrics and Gynecology | Admitting: Obstetrics and Gynecology

## 2023-07-20 ENCOUNTER — Encounter (HOSPITAL_COMMUNITY): Payer: Self-pay | Admitting: Obstetrics and Gynecology

## 2023-07-20 ENCOUNTER — Inpatient Hospital Stay (HOSPITAL_COMMUNITY): Payer: Medicaid Other

## 2023-07-20 DIAGNOSIS — O99213 Obesity complicating pregnancy, third trimester: Secondary | ICD-10-CM

## 2023-07-20 DIAGNOSIS — O09523 Supervision of elderly multigravida, third trimester: Secondary | ICD-10-CM | POA: Diagnosis not present

## 2023-07-20 DIAGNOSIS — O133 Gestational [pregnancy-induced] hypertension without significant proteinuria, third trimester: Secondary | ICD-10-CM | POA: Diagnosis present

## 2023-07-20 DIAGNOSIS — M329 Systemic lupus erythematosus, unspecified: Secondary | ICD-10-CM

## 2023-07-20 DIAGNOSIS — Z8616 Personal history of COVID-19: Secondary | ICD-10-CM

## 2023-07-20 DIAGNOSIS — Z833 Family history of diabetes mellitus: Secondary | ICD-10-CM

## 2023-07-20 DIAGNOSIS — E669 Obesity, unspecified: Secondary | ICD-10-CM

## 2023-07-20 DIAGNOSIS — O10013 Pre-existing essential hypertension complicating pregnancy, third trimester: Secondary | ICD-10-CM | POA: Diagnosis not present

## 2023-07-20 DIAGNOSIS — O1092 Unspecified pre-existing hypertension complicating childbirth: Secondary | ICD-10-CM | POA: Diagnosis present

## 2023-07-20 DIAGNOSIS — O10919 Unspecified pre-existing hypertension complicating pregnancy, unspecified trimester: Secondary | ICD-10-CM | POA: Diagnosis present

## 2023-07-20 DIAGNOSIS — O1413 Severe pre-eclampsia, third trimester: Principal | ICD-10-CM

## 2023-07-20 DIAGNOSIS — O24420 Gestational diabetes mellitus in childbirth, diet controlled: Secondary | ICD-10-CM | POA: Diagnosis present

## 2023-07-20 DIAGNOSIS — O88213 Thromboembolism in pregnancy, third trimester: Secondary | ICD-10-CM

## 2023-07-20 DIAGNOSIS — O9832 Other infections with a predominantly sexual mode of transmission complicating childbirth: Secondary | ICD-10-CM | POA: Diagnosis present

## 2023-07-20 DIAGNOSIS — Z86711 Personal history of pulmonary embolism: Secondary | ICD-10-CM

## 2023-07-20 DIAGNOSIS — Z79899 Other long term (current) drug therapy: Secondary | ICD-10-CM

## 2023-07-20 DIAGNOSIS — Z3A33 33 weeks gestation of pregnancy: Secondary | ICD-10-CM

## 2023-07-20 DIAGNOSIS — O09293 Supervision of pregnancy with other poor reproductive or obstetric history, third trimester: Secondary | ICD-10-CM

## 2023-07-20 DIAGNOSIS — O99891 Other specified diseases and conditions complicating pregnancy: Secondary | ICD-10-CM

## 2023-07-20 DIAGNOSIS — O9902 Anemia complicating childbirth: Secondary | ICD-10-CM | POA: Diagnosis present

## 2023-07-20 DIAGNOSIS — O99214 Obesity complicating childbirth: Secondary | ICD-10-CM | POA: Diagnosis present

## 2023-07-20 DIAGNOSIS — A6000 Herpesviral infection of urogenital system, unspecified: Secondary | ICD-10-CM | POA: Diagnosis present

## 2023-07-20 DIAGNOSIS — Z8249 Family history of ischemic heart disease and other diseases of the circulatory system: Secondary | ICD-10-CM

## 2023-07-20 DIAGNOSIS — O114 Pre-existing hypertension with pre-eclampsia, complicating childbirth: Principal | ICD-10-CM | POA: Diagnosis present

## 2023-07-20 LAB — CBC
HCT: 33.1 % — ABNORMAL LOW (ref 36.0–46.0)
Hemoglobin: 10.9 g/dL — ABNORMAL LOW (ref 12.0–15.0)
MCH: 27.4 pg (ref 26.0–34.0)
MCHC: 32.9 g/dL (ref 30.0–36.0)
MCV: 83.2 fL (ref 80.0–100.0)
Platelets: 285 10*3/uL (ref 150–400)
RBC: 3.98 MIL/uL (ref 3.87–5.11)
RDW: 14.8 % (ref 11.5–15.5)
WBC: 5.3 10*3/uL (ref 4.0–10.5)
nRBC: 0 % (ref 0.0–0.2)

## 2023-07-20 LAB — PROTEIN / CREATININE RATIO, URINE
Creatinine, Urine: 341 mg/dL
Protein Creatinine Ratio: 0.13 mg/mg{creat} (ref 0.00–0.15)
Total Protein, Urine: 45 mg/dL

## 2023-07-20 LAB — COMPREHENSIVE METABOLIC PANEL
ALT: 13 U/L (ref 0–44)
AST: 20 U/L (ref 15–41)
Albumin: 2.8 g/dL — ABNORMAL LOW (ref 3.5–5.0)
Alkaline Phosphatase: 127 U/L — ABNORMAL HIGH (ref 38–126)
Anion gap: 16 — ABNORMAL HIGH (ref 5–15)
BUN: 5 mg/dL — ABNORMAL LOW (ref 6–20)
CO2: 16 mmol/L — ABNORMAL LOW (ref 22–32)
Calcium: 9.2 mg/dL (ref 8.9–10.3)
Chloride: 104 mmol/L (ref 98–111)
Creatinine, Ser: 0.54 mg/dL (ref 0.44–1.00)
GFR, Estimated: >60 mL/min (ref >60)
Glucose, Bld: 181 mg/dL — ABNORMAL HIGH (ref 70–99)
Potassium: 3.1 mmol/L — ABNORMAL LOW (ref 3.5–5.1)
Sodium: 136 mmol/L (ref 135–145)
Total Bilirubin: 0.3 mg/dL (ref <1.2)
Total Protein: 6.6 g/dL (ref 6.5–8.1)

## 2023-07-20 MED ORDER — CALCIUM CARBONATE ANTACID 500 MG PO CHEW
2.0000 | CHEWABLE_TABLET | ORAL | Status: DC | PRN
  Administered 2023-07-24: 400 mg via ORAL
  Filled 2023-07-20: qty 2

## 2023-07-20 MED ORDER — PRENATAL MULTIVITAMIN CH: 1.0000 | ORAL_TABLET | Freq: Every day | ORAL | Status: DC

## 2023-07-20 MED ORDER — OXYCODONE HCL 5 MG PO TABS
5.0000 mg | ORAL_TABLET | ORAL | Status: DC | PRN
  Administered 2023-07-20 – 2023-07-21 (×3): 5 mg via ORAL
  Filled 2023-07-20 (×3): qty 1

## 2023-07-20 MED ORDER — METOCLOPRAMIDE HCL 10 MG PO TABS
10.0000 mg | ORAL_TABLET | Freq: Once | ORAL | Status: AC
  Administered 2023-07-20: 10 mg via ORAL
  Filled 2023-07-20: qty 1

## 2023-07-20 MED ORDER — HYDRALAZINE HCL 20 MG/ML IJ SOLN: 10.0000 mg | INTRAMUSCULAR | Status: DC | PRN

## 2023-07-20 MED ORDER — ACETAMINOPHEN-CAFFEINE 500-65 MG PO TABS
2.0000 | ORAL_TABLET | ORAL | Status: AC
  Administered 2023-07-20: 2 via ORAL
  Filled 2023-07-20: qty 2

## 2023-07-20 MED ORDER — ZOLPIDEM TARTRATE 5 MG PO TABS
5.0000 mg | ORAL_TABLET | Freq: Every evening | ORAL | Status: DC | PRN
  Administered 2023-07-21: 5 mg via ORAL
  Filled 2023-07-20: qty 1

## 2023-07-20 MED ORDER — SODIUM CHLORIDE 0.9 % IV BOLUS
500.0000 mL | Freq: Once | INTRAVENOUS | Status: AC
  Administered 2023-07-20: 500 mL via INTRAVENOUS

## 2023-07-20 MED ORDER — DEXAMETHASONE SODIUM PHOSPHATE 10 MG/ML IJ SOLN
10.0000 mg | Freq: Once | INTRAMUSCULAR | Status: AC
  Administered 2023-07-20: 10 mg via INTRAVENOUS
  Filled 2023-07-20: qty 1

## 2023-07-20 MED ORDER — BUTALBITAL-APAP-CAFFEINE 50-325-40 MG PO TABS
2.0000 | ORAL_TABLET | Freq: Once | ORAL | Status: AC
  Administered 2023-07-20: 2 via ORAL
  Filled 2023-07-20: qty 2

## 2023-07-20 MED ORDER — DIPHENHYDRAMINE HCL 25 MG PO CAPS
25.0000 mg | ORAL_CAPSULE | ORAL | Status: AC
  Administered 2023-07-20: 25 mg via ORAL
  Filled 2023-07-20: qty 1

## 2023-07-20 MED ORDER — CYCLOBENZAPRINE HCL 5 MG PO TABS
10.0000 mg | ORAL_TABLET | Freq: Once | ORAL | Status: AC
  Administered 2023-07-20: 10 mg via ORAL
  Filled 2023-07-20: qty 2

## 2023-07-20 MED ORDER — LABETALOL HCL 200 MG PO TABS
300.0000 mg | ORAL_TABLET | Freq: Three times a day (TID) | ORAL | Status: DC
Start: 2023-07-20 — End: 2023-07-21
  Administered 2023-07-20 – 2023-07-21 (×2): 300 mg via ORAL
  Filled 2023-07-20 (×2): qty 1

## 2023-07-20 MED ORDER — BUTALBITAL-APAP-CAFFEINE 50-325-40 MG PO TABS: 2.0000 | ORAL_TABLET | Freq: Four times a day (QID) | ORAL | Status: DC | PRN

## 2023-07-20 MED ORDER — ACETAMINOPHEN 325 MG PO TABS
650.0000 mg | ORAL_TABLET | ORAL | Status: DC | PRN
  Administered 2023-07-21 – 2023-07-22 (×3): 650 mg via ORAL
  Filled 2023-07-20 (×3): qty 2

## 2023-07-20 MED ORDER — BUTALBITAL-APAP-CAFFEINE 50-325-40 MG PO TABS
1.0000 | ORAL_TABLET | ORAL | Status: AC
  Administered 2023-07-20: 1 via ORAL
  Filled 2023-07-20: qty 1

## 2023-07-20 MED ORDER — ENOXAPARIN SODIUM 150 MG/ML IJ SOSY
150.0000 mg | PREFILLED_SYRINGE | Freq: Two times a day (BID) | INTRAMUSCULAR | Status: DC
  Administered 2023-07-20 – 2023-07-21 (×3): 150 mg via SUBCUTANEOUS
  Filled 2023-07-20 (×6): qty 1

## 2023-07-20 MED ORDER — CYCLOBENZAPRINE HCL 5 MG PO TABS
10.0000 mg | ORAL_TABLET | Freq: Three times a day (TID) | ORAL | Status: DC | PRN
  Administered 2023-07-20 – 2023-07-21 (×2): 10 mg via ORAL
  Filled 2023-07-20 (×2): qty 1

## 2023-07-20 MED ORDER — METOCLOPRAMIDE HCL 5 MG/ML IJ SOLN
10.0000 mg | Freq: Once | INTRAMUSCULAR | Status: AC
  Administered 2023-07-20: 10 mg via INTRAVENOUS
  Filled 2023-07-20: qty 2

## 2023-07-20 MED ORDER — LABETALOL HCL 100 MG PO TABS
300.0000 mg | ORAL_TABLET | Freq: Once | ORAL | Status: AC
  Administered 2023-07-20: 300 mg via ORAL
  Filled 2023-07-20: qty 3

## 2023-07-20 MED ORDER — LABETALOL HCL 5 MG/ML IV SOLN
20.0000 mg | INTRAVENOUS | Status: DC | PRN
  Administered 2023-07-20 – 2023-07-21 (×2): 20 mg via INTRAVENOUS
  Filled 2023-07-20 (×2): qty 4

## 2023-07-20 MED ORDER — SODIUM CHLORIDE 0.9% FLUSH
3.0000 mL | Freq: Two times a day (BID) | INTRAVENOUS | Status: DC
  Administered 2023-07-20: 10 mL via INTRAVENOUS
  Administered 2023-07-21: 3 mL via INTRAVENOUS

## 2023-07-20 MED ORDER — DIPHENHYDRAMINE HCL 50 MG/ML IJ SOLN
25.0000 mg | Freq: Once | INTRAMUSCULAR | Status: AC
  Administered 2023-07-20: 25 mg via INTRAVENOUS
  Filled 2023-07-20: qty 1

## 2023-07-20 MED ORDER — MAGNESIUM OXIDE -MG SUPPLEMENT 400 (240 MG) MG PO TABS
400.0000 mg | ORAL_TABLET | Freq: Every day | ORAL | Status: DC
  Administered 2023-07-20: 400 mg via ORAL
  Filled 2023-07-20 (×3): qty 1

## 2023-07-20 MED ORDER — LABETALOL HCL 5 MG/ML IV SOLN: 80.0000 mg | INTRAVENOUS | Status: DC | PRN

## 2023-07-20 MED ORDER — SODIUM CHLORIDE 0.9% FLUSH
3.0000 mL | INTRAVENOUS | Status: DC | PRN
  Administered 2023-07-21: 10 mL via INTRAVENOUS

## 2023-07-20 MED ORDER — DOCUSATE SODIUM 100 MG PO CAPS: 100.0000 mg | ORAL_CAPSULE | Freq: Every day | ORAL | Status: DC

## 2023-07-20 MED ORDER — LABETALOL HCL 5 MG/ML IV SOLN
40.0000 mg | INTRAVENOUS | Status: DC | PRN
  Administered 2023-07-21: 40 mg via INTRAVENOUS
  Filled 2023-07-20: qty 8

## 2023-07-20 NOTE — MAU Note (Signed)
.  Hayley Webb is a 37 y.o. at [redacted]w[redacted]d here in MAU reporting: Sent over from the office for BP eval. She reports a severe range pressure in office. She reports her appointment was at 0915 and she had taken her Labetalol at 0845. She reports an ongoing HA for the past two weeks with no relief. She reports blurred vision and RUQ pain that feels like "squeezing" that began last Wednesday. Bilateral swelling of hands and feet. Denies VB or LOF. +FM.  CHTN - Labetalol 200 mg TID. Hx pre-term CTX's in this pregnancy. Hx PE - Lovenox 150 mg BID, two pre-term deliveries.  Last doses: Flexeril last night Labetalol 200 mg 0845 Lovenox   Pain score: 7/10 HA  Vitals:   07/20/23 1110  BP: (!) 142/99  Pulse: (!) 113  Resp: 16  Temp: 97.9 F (36.6 C)  SpO2: 97%      FHT: 143 initial external Lab orders placed from triage: none

## 2023-07-20 NOTE — H&P (Cosign Needed)
OB ADMISSION/ HISTORY & PHYSICAL:  Admission Date: 07/20/2023 10:58 AM  Admit Diagnosis: Gestational hypertension  Hayley Webb is a 37 y.o. female [id number] [redacted]w[redacted]d presented from the office for pre-e labs and monitoring. BP was elevated in the office, hx of CHTN w/ pre-e w/ last pregnancy in 2023. CHTN managed on Labetalol 200 TID, dose was increased today. Endorses active FM, denies LOF and vaginal bleeding. Pt was seen in MAU, pre-e labs were grossly normal, mild to severe range BP,   History of current pregnancy: [id number]   Patient entered care with [facility] at 11+2 wks.   EDC 09/04/23 by LMP and congruent w/ early 1st trimester U/S.   Anatomy scan:  complete w/ anterior placenta.   Antenatal testing: for hx of CHTN -growth Q 4 weeks at 28 wks and testing at 32 weeks Last evaluation: 33 wks cephalic/ anterior placenta/ AFI 11/ EFW 4+12 (80%)  Significant prenatal events:  Patient Active Problem List   Diagnosis Date Noted   Gestational hypertension w/o significant proteinuria in 3rd trimester 07/20/2023   History of pre-eclampsia in prior pregnancy, currently pregnant 04/06/2023   Chronic hypertension during pregnancy, antepartum 04/06/2023   Preterm labor 12/23/2018   Maternal obesity affecting pregnancy, antepartum 06/11/2018   On continuous oral anticoagulation 04/04/2017   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   Antiphospholipid antibody syndrome (HCC) 02/10/2017   History of pulmonary embolus (PE) 07/24/2016   Lupus    Asthma    HSV infection     Prenatal Labs: ABO, Rh: --/--/O POS (01/01 1503) Antibody: NEG (01/01 1503) Rubella:   immune RPR:   NR HBsAg:   NR HIV:   NR GTT: normal 1 hr GBS:   unknown GC/CHL: neg/neg Vaccines: Tdap: current Influenza: declined   OB History  Gravida Para Term Preterm AB Living  6 3 1 2 2 3   SAB IAB Ectopic Multiple Live Births  2   0 3    # Outcome Date GA Lbr Len/2nd Weight Sex Type Anes PTL Lv  6 Current           5  Preterm 01/28/22 [redacted]w[redacted]d 04:32 1950 g M Vag-Spont EPI  LIV  4 Preterm 01/15/19 [redacted]w[redacted]d 597:36 / 00:10 2865 g M Vag-Spont EPI  LIV  3 Term 10/25/13 [redacted]w[redacted]d 09:43 / 00:14  M Vag-Vacuum EPI  LIV  2 SAB      SAB     1 SAB             Medical / Surgical History: Past medical history:  Past Medical History:  Diagnosis Date   Allergy to shellfish 06/11/2018   Altered thought processes 04/04/2017   r/o embolic event     Anxiety    doing good now   Anxiety disorder 12/23/2018   Asthma    Chest pain 04/04/2017   Chronic hypertension affecting pregnancy 07/25/2018   Chronic hypertension complicating or reason for care during pregnancy, third trimester 01/12/2019   COVID 07/2021   mild   Depression    doing good   Depressive disorder 12/23/2018   Headache(784.0)    Hirsutism 10/02/2012   HSV infection    Hypertension    was taken off meds after last preg   Infection    UTI   Knee pain 01/12/2020   Left knee pain 10/20/2020   Lupus    dx age 48   Nonintractable headache 04/04/2017   Normal postpartum course 01/17/2019   Obesity-BMI 47 05/09/2013   Pneumonia  blood clot appeared during the pneumonia   Postpartum anemia 01/17/2019   Preeclampsia 01/25/2022   Pregnant 07/01/2018   Pulmonary embolism (HCC) 06/4/82011   Pulmonary embolus (HCC) 02/09/2017   Right knee pain 10/12/2020   Shortness of breath    Sicca syndrome (HCC) 08/27/2018   STD (female) 01/18/2012   Hx GC 8/10  Hx Chlamydia 6/13     STD (sexually transmitted disease) 02/2009   POSITIVE GC   Threatened preterm labor 12/25/2018   Threatened preterm labor, third trimester 12/23/2018   UTI (urinary tract infection)    Word finding difficulty 04/04/2017    Past surgical history:  Past Surgical History:  Procedure Laterality Date   ADENOIDECTOMY     TONSILLECTOMY AND ADENOIDECTOMY  1998   WISDOM TOOTH EXTRACTION     Family History:  Family History  Problem Relation Age of Onset   Asthma Mother    Healthy  Father    Asthma Brother    Diabetes Maternal Aunt    Lupus Paternal Aunt    Stroke Paternal Aunt    Cancer Maternal Grandmother 72       COLON CA   Diabetes Maternal Grandmother    Hypertension Maternal Grandfather    Cancer Maternal Grandfather        prostate   Cancer Paternal Grandmother        breast   Lupus Paternal Grandmother    Anesthesia problems Neg Hx    Hypotension Neg Hx    Malignant hyperthermia Neg Hx    Pseudochol deficiency Neg Hx     Social History:  reports that she has never smoked. She has never used smokeless tobacco. She reports that she does not currently use alcohol. She reports that she does not use drugs.  Allergies: Bactrim, Hydrocodone, Nifedipine, Pineapple, Prednisone, and Shrimp extract   Current Medications at time of admission:  Prior to Admission medications   Medication Sig Start Date End Date Taking? Authorizing Provider  acetaminophen (TYLENOL) 325 MG tablet Take 2 tablets (650 mg total) by mouth every 6 (six) hours as needed for headache or mild pain (for pain scale < 4  OR  temperature  >/=  100.5 F). 01/31/22  Yes Pinn, Naoma Diener, MD  cholecalciferol (VITAMIN D3) 25 MCG (1000 UNIT) tablet Take 2,000 Units by mouth daily.   Yes [provider]  cyclobenzaprine (FLEXERIL) 10 MG tablet Take 1 tablet (10 mg total) by mouth 2 (two) times daily as needed. 07/09/23  Yes Arita Miss, Rolitta, [name]  enoxaparin (LOVENOX) 150 MG/ML injection Inject 1 mL (150 mg total) into the skin in the morning and at bedtime. 02/19/23  Yes Sandra Cockayne M, [name]  ferrous sulfate 325 (65 FE) MG tablet Take 325 mg by mouth daily with breakfast.   Yes [provider]  hydroxychloroquine (PLAQUENIL) 200 MG tablet Take by mouth daily.   Yes [provider]  labetalol (NORMODYNE) 100 MG tablet Take 100 mg by mouth 3 (three) times daily.   Yes [provider]  oxyCODONE-acetaminophen (PERCOCET/ROXICET) 5-325 MG tablet Take by mouth every 4 (four)  hours as needed for severe pain (pain score 7-10).   Yes [provider]  ondansetron (ZOFRAN-ODT) 4 MG disintegrating tablet Take 1 tablet (4 mg total) by mouth every 6 (six) hours as needed for nausea. 06/07/23   Aviva Signs, [name]    Review of Systems: Constitutional: Negative   HENT: Negative   Eyes: Negative   Respiratory: Negative   Cardiovascular: Negative  Gastrointestinal: Negative  Genitourinary: neg for bloody show, neg for LOF   Musculoskeletal: Negative   Skin: Negative   Neurological: Negative   Endo/Heme/Allergies: Negative   Psychiatric/Behavioral: Negative    Physical Exam: VS: Blood pressure (!) 161/98, pulse (!) 107, temperature 97.9 F (36.6 C), temperature source Oral, resp. rate 16, height 5' 8.5" (1.74 m), weight (!) 145.5 kg, last menstrual period 11/18/2022, SpO2 97%, currently breastfeeding. AAO x3, no signs of distress Cardiovascular: RRR Respiratory: Unlabored GU/GI: Abdomen gravid, non-tender, non-distended, active FM, vertex Extremities: 1+, non-pitting edema, negative for pain, tenderness, and cords  Cervical exam: deferred, no S/S of labor FHR: baseline rate 125 baseline / variability moderate / accelerations present / decelerations absent TOCO: occasional   Prenatal Transfer Tool  Maternal Diabetes: No Genetic Screening: Normal Maternal Ultrasounds/Referrals: Normal Fetal Ultrasounds or other Referrals:  Fetal echo normal Maternal Substance Abuse:  No Significant Maternal Medications:  Meds include: Other:  Labetalol, Lovenox, Plaquenil Significant Maternal Lab Results: Other:  Number of Prenatal Visits:greater than 3 verified prenatal visits Maternal Vaccinations:TDap Other Comments:  None    Assessment: 37 y.o. [id number] [redacted]w[redacted]d  Chronic hypertension R/O pre-eclampsia    -pre-e labs unremarkable     -moderate to severe range BP    -increased Labetalol from 200 mg TID to 300 mg TID Headache    -PRN meds ordered Hx of  pulmonary embolism    -continue Lovenox 150 mg SQ Q 12 hrs  Plan:  23 hr Observation on OB Specialty Care unit Routine antenatal orders Plan developed w/ Dr. Tresa Moore DNP, [name] 07/20/2023 3:56 PM

## 2023-07-20 NOTE — MAU Provider Note (Cosign Needed Addendum)
History     CSN: [id number]  Arrival date and time: 07/20/23 1058   Event Date/Time   First Provider Initiated Contact with Patient 07/20/2023 11:10 AM   Chief Complaint  Patient presents with   Hypertension   Headache    HPI  Hayley Webb is a 36 y.o. [id number] at [redacted]w[redacted]d who presents to the MAU for pre-e eval. Pt with known hx pre-e -- pt with pre-e with SF (headaches) in 2023 requiring pre-term IOL at [redacted]w[redacted]d. She presented to her OB's office today for GTT, had a severe range BP (160s/110s) on presentation and was redirected to MAU for further pre-e eval. States BP was checked at 0915 and she had taken her dose of Labetalol 200mg  about 30 min prior to presenting at [employer] officer. She reports ongoing headache x 2 weeks -- not relieved with Flexeril. Also endorses blurred vision and RUQ pain, both which started 9 days ago. Endorses bilateral upper and lower extremity swelling over this time frame as well. No SOB.   Past Medical History:  Diagnosis Date   Allergy to shellfish 06/11/2018   Altered thought processes 04/04/2017   r/o embolic event     Anxiety    doing good now   Anxiety disorder 12/23/2018   Asthma    Chest pain 04/04/2017   Chronic hypertension affecting pregnancy 07/25/2018   Chronic hypertension complicating or reason for care during pregnancy, third trimester 01/12/2019   COVID 07/2021   mild   Depression    doing good   Depressive disorder 12/23/2018   Headache(784.0)    Hirsutism 10/02/2012   HSV infection    Hypertension    was taken off meds after last preg   Infection    UTI   Knee pain 01/12/2020   Left knee pain 10/20/2020   Lupus    dx age 80   Nonintractable headache 04/04/2017   Normal postpartum course 01/17/2019   Obesity-BMI 47 05/09/2013   Pneumonia    blood clot appeared during the pneumonia   Postpartum anemia 01/17/2019   Preeclampsia 01/25/2022   Pregnant 07/01/2018   Pulmonary embolism (HCC) 06/4/82011   Pulmonary embolus (HCC)  02/09/2017   Right knee pain 10/12/2020   Shortness of breath    Sicca syndrome (HCC) 08/27/2018   STD (female) 01/18/2012   Hx GC 8/10  Hx Chlamydia 6/13     STD (sexually transmitted disease) 02/2009   POSITIVE GC   Threatened preterm labor 12/25/2018   Threatened preterm labor, third trimester 12/23/2018   UTI (urinary tract infection)    Word finding difficulty 04/04/2017    Past Surgical History:  Procedure Laterality Date   ADENOIDECTOMY     TONSILLECTOMY AND ADENOIDECTOMY  1998   WISDOM TOOTH EXTRACTION      Family History  Problem Relation Age of Onset   Asthma Mother    Healthy Father    Asthma Brother    Diabetes Maternal Aunt    Lupus Paternal Aunt    Stroke Paternal Aunt    Cancer Maternal Grandmother 72       COLON CA   Diabetes Maternal Grandmother    Hypertension Maternal Grandfather    Cancer Maternal Grandfather        prostate   Cancer Paternal Grandmother        breast   Lupus Paternal Grandmother    Anesthesia problems Neg Hx    Hypotension Neg Hx    Malignant hyperthermia Neg Hx    Pseudochol deficiency Neg  Hx     Social History   Tobacco Use   Smoking status: Never   Smokeless tobacco: Never  Vaping Use   Vaping status: Never Used  Substance Use Topics   Alcohol use: Not Currently    Comment: occsinally   Drug use: No    Allergies:  Allergies  Allergen Reactions   Bactrim Anaphylaxis and Hives   Hydrocodone Anaphylaxis   Nifedipine Swelling and Anaphylaxis    Swelling of tongue   Pineapple Itching   Prednisone Anaphylaxis and Hives    Has been on Dexamethasone without issue.   Shrimp Extract Itching    Medications Prior to Admission  Medication Sig Dispense Refill Last Dose/Taking   acetaminophen (TYLENOL) 325 MG tablet Take 2 tablets (650 mg total) by mouth every 6 (six) hours as needed for headache or mild pain (for pain scale < 4  OR  temperature  >/=  100.5 F). 60 tablet 3 Past Week   cholecalciferol (VITAMIN D3) 25  MCG (1000 UNIT) tablet Take 2,000 Units by mouth daily.   07/19/2023   cyclobenzaprine (FLEXERIL) 10 MG tablet Take 1 tablet (10 mg total) by mouth 2 (two) times daily as needed. 20 tablet 0 07/19/2023   enoxaparin (LOVENOX) 150 MG/ML injection Inject 1 mL (150 mg total) into the skin in the morning and at bedtime. 30 mL 1 07/20/2023 at  8:30 AM   ferrous sulfate 325 (65 FE) MG tablet Take 325 mg by mouth daily with breakfast.   07/19/2023   hydroxychloroquine (PLAQUENIL) 200 MG tablet Take by mouth daily.   07/20/2023   labetalol (NORMODYNE) 100 MG tablet Take 100 mg by mouth 3 (three) times daily.   07/20/2023   oxyCODONE-acetaminophen (PERCOCET/ROXICET) 5-325 MG tablet Take by mouth every 4 (four) hours as needed for severe pain (pain score 7-10).   Past Week   ondansetron (ZOFRAN-ODT) 4 MG disintegrating tablet Take 1 tablet (4 mg total) by mouth every 6 (six) hours as needed for nausea. 20 tablet 0     ROS reviewed and pertinent positives and negatives as documented in HPI.  Physical Exam   Blood pressure (!) 172/105, pulse (!) 111, temperature 97.9 F (36.6 C), temperature source Oral, resp. rate 16, height 5' 8.5" (1.74 m), weight (!) 145.5 kg, last menstrual period 11/18/2022, SpO2 97%, currently breastfeeding.  Physical Exam Constitutional:      General: She is not in acute distress.    Appearance: Normal appearance. She is not ill-appearing.  HENT:     Head: Normocephalic and atraumatic.  Cardiovascular:     Rate and Rhythm: Normal rate.  Pulmonary:     Effort: Pulmonary effort is normal.     Breath sounds: Normal breath sounds.  Abdominal:     Palpations: Abdomen is soft.     Tenderness: There is no abdominal tenderness. There is no guarding.  Musculoskeletal:        General: Normal range of motion.  Skin:    General: Skin is warm and dry.     Findings: No rash.  Neurological:     General: No focal deficit present.     Mental Status: She is alert and oriented to  person, place, and time.   EFM: 125/mod/+a/-d  MAU Course  Procedures  MDM 37 y.o. [id number] at [redacted]w[redacted]d presenting for pre-e eval, has known hx pre-e with SF, requiring preterm IOL. BP elevated in severe range in clinic today x 1, reportedly also high at home. Here, readings have been in  the 140s/90s-100s. Endorses headache, blurred vision, and edema, however these symptoms have been ongoing x 1 week for pt. Has known cHTN, on Labetalol 200 mg BID. Will get PEC w/up and reassess. Will tx headache with migraine cocktail which has helped in past.  1339 PEC w/up returned -- PCR negative, PLT, Cr, LFTs all ok. HA marginally better per pt. Discussed case with Rhea Pink, [name], who recommends adjusting dose of Labetalol to 300 mg TID and will get pt close f/up in clinic. Recommends trial of Fioricet and obtaining BPP prior to discharge.  1500 Pt reports headache now mildly worse after Fioricet. Feels woozy, so eating a meal (has been fasting all day). Discussed update with [place], [name], who will discuss case with Dr. Normand Sloop and f/up.  1532 Pt with severe range Bps x 2, reports headache worsening. Called Dr. Normand Sloop with update -- plan is to admit pt. Dr. Normand Sloop recommended starting Labetalol protocol and to hold off on Mag gtt for now given labs wnl. Pt updated on plan.  Assessment and Plan  Preeclampsia, severe, third trimester Plan for admission, further A&P per primary OB   Sundra Aland, MD OB Fellow, Faculty Practice [facility], Center for [facility]  07/20/2023, 3:43 PM

## 2023-07-21 ENCOUNTER — Observation Stay (HOSPITAL_COMMUNITY): Payer: Medicaid Other

## 2023-07-21 DIAGNOSIS — O114 Pre-existing hypertension with pre-eclampsia, complicating childbirth: Secondary | ICD-10-CM | POA: Diagnosis present

## 2023-07-21 DIAGNOSIS — Z3A33 33 weeks gestation of pregnancy: Secondary | ICD-10-CM | POA: Diagnosis not present

## 2023-07-21 DIAGNOSIS — Z86711 Personal history of pulmonary embolism: Secondary | ICD-10-CM | POA: Diagnosis not present

## 2023-07-21 DIAGNOSIS — O9902 Anemia complicating childbirth: Secondary | ICD-10-CM | POA: Diagnosis present

## 2023-07-21 DIAGNOSIS — Z833 Family history of diabetes mellitus: Secondary | ICD-10-CM | POA: Diagnosis not present

## 2023-07-21 DIAGNOSIS — O1092 Unspecified pre-existing hypertension complicating childbirth: Secondary | ICD-10-CM | POA: Diagnosis present

## 2023-07-21 DIAGNOSIS — O24420 Gestational diabetes mellitus in childbirth, diet controlled: Secondary | ICD-10-CM | POA: Diagnosis present

## 2023-07-21 DIAGNOSIS — Z8616 Personal history of COVID-19: Secondary | ICD-10-CM | POA: Diagnosis not present

## 2023-07-21 DIAGNOSIS — Z79899 Other long term (current) drug therapy: Secondary | ICD-10-CM | POA: Diagnosis not present

## 2023-07-21 DIAGNOSIS — Z8249 Family history of ischemic heart disease and other diseases of the circulatory system: Secondary | ICD-10-CM | POA: Diagnosis not present

## 2023-07-21 DIAGNOSIS — O9832 Other infections with a predominantly sexual mode of transmission complicating childbirth: Secondary | ICD-10-CM | POA: Diagnosis present

## 2023-07-21 DIAGNOSIS — A6000 Herpesviral infection of urogenital system, unspecified: Secondary | ICD-10-CM | POA: Diagnosis present

## 2023-07-21 DIAGNOSIS — O99214 Obesity complicating childbirth: Secondary | ICD-10-CM | POA: Diagnosis present

## 2023-07-21 DIAGNOSIS — O133 Gestational [pregnancy-induced] hypertension without significant proteinuria, third trimester: Secondary | ICD-10-CM | POA: Diagnosis present

## 2023-07-21 LAB — BASIC METABOLIC PANEL
Anion gap: 14 (ref 5–15)
BUN: <5 mg/dL — ABNORMAL LOW (ref 6–20)
CO2: 13 mmol/L — ABNORMAL LOW (ref 22–32)
Calcium: 9.3 mg/dL (ref 8.9–10.3)
Chloride: 105 mmol/L (ref 98–111)
Creatinine, Ser: 0.67 mg/dL (ref 0.44–1.00)
GFR, Estimated: >60 mL/min (ref >60)
Glucose, Bld: 215 mg/dL — ABNORMAL HIGH (ref 70–99)
Potassium: 3.6 mmol/L (ref 3.5–5.1)
Sodium: 132 mmol/L — ABNORMAL LOW (ref 135–145)

## 2023-07-21 LAB — CBC
HCT: 33.5 % — ABNORMAL LOW (ref 36.0–46.0)
Hemoglobin: 11 g/dL — ABNORMAL LOW (ref 12.0–15.0)
MCH: 27.4 pg (ref 26.0–34.0)
MCHC: 32.8 g/dL (ref 30.0–36.0)
MCV: 83.3 fL (ref 80.0–100.0)
Platelets: 284 10*3/uL (ref 150–400)
RBC: 4.02 MIL/uL (ref 3.87–5.11)
RDW: 15 % (ref 11.5–15.5)
WBC: 6.8 10*3/uL (ref 4.0–10.5)
nRBC: 0 % (ref 0.0–0.2)

## 2023-07-21 LAB — TROPONIN I (HIGH SENSITIVITY)
Troponin I (High Sensitivity): 5 ng/L (ref <18)
Troponin I (High Sensitivity): 4 ng/L (ref <18)

## 2023-07-21 LAB — HEPATIC FUNCTION PANEL
ALT: 13 U/L (ref 0–44)
AST: 23 U/L (ref 15–41)
Albumin: 2.8 g/dL — ABNORMAL LOW (ref 3.5–5.0)
Alkaline Phosphatase: 127 U/L — ABNORMAL HIGH (ref 38–126)
Bilirubin, Direct: <0.1 mg/dL (ref 0.0–0.2)
Total Bilirubin: 0.3 mg/dL (ref <1.2)
Total Protein: 6.7 g/dL (ref 6.5–8.1)

## 2023-07-21 LAB — D-DIMER, QUANTITATIVE: D-Dimer, Quant: 0.9 ug{FEU}/mL — ABNORMAL HIGH (ref 0.00–0.50)

## 2023-07-21 LAB — GLUCOSE, CAPILLARY
Glucose-Capillary: 222 mg/dL — ABNORMAL HIGH (ref 70–99)
Glucose-Capillary: 108 mg/dL — ABNORMAL HIGH (ref 70–99)

## 2023-07-21 MED ORDER — DIPHENHYDRAMINE HCL 50 MG/ML IJ SOLN
25.0000 mg | Freq: Once | INTRAMUSCULAR | Status: AC
  Administered 2023-07-21: 25 mg via INTRAVENOUS
  Filled 2023-07-21: qty 1

## 2023-07-21 MED ORDER — ONDANSETRON 4 MG PO TBDP
4.0000 mg | ORAL_TABLET | Freq: Four times a day (QID) | ORAL | Status: DC | PRN
  Administered 2023-07-21 (×2): 4 mg via ORAL
  Filled 2023-07-21 (×2): qty 1

## 2023-07-21 MED ORDER — LORAZEPAM 2 MG/ML IJ SOLN: 1.0000 mg | Freq: Once | INTRAMUSCULAR | Status: DC

## 2023-07-21 MED ORDER — HYDROXYZINE HCL 10 MG/5ML PO SYRP: 25.0000 mg | ORAL_SOLUTION | Freq: Three times a day (TID) | ORAL | Status: DC | PRN

## 2023-07-21 MED ORDER — MORPHINE SULFATE (PF) 2 MG/ML IV SOLN
1.0000 mg | INTRAVENOUS | Status: DC | PRN
  Administered 2023-07-21: 1 mg via INTRAVENOUS
  Filled 2023-07-21: qty 1

## 2023-07-21 MED ORDER — IOHEXOL 350 MG/ML SOLN
75.0000 mL | Freq: Once | INTRAVENOUS | Status: AC | PRN
  Administered 2023-07-21: 75 mL via INTRAVENOUS

## 2023-07-21 MED ORDER — LABETALOL HCL 200 MG PO TABS: 400.0000 mg | ORAL_TABLET | Freq: Three times a day (TID) | ORAL | Status: DC

## 2023-07-21 MED ORDER — DEXAMETHASONE SODIUM PHOSPHATE 10 MG/ML IJ SOLN
10.0000 mg | Freq: Once | INTRAMUSCULAR | Status: AC
  Administered 2023-07-21: 10 mg via INTRAVENOUS
  Filled 2023-07-21: qty 1

## 2023-07-21 MED ORDER — LORAZEPAM 2 MG/ML IJ SOLN
INTRAMUSCULAR | Status: AC
  Filled 2023-07-21: qty 1

## 2023-07-21 MED ORDER — METOCLOPRAMIDE HCL 5 MG/ML IJ SOLN
10.0000 mg | Freq: Once | INTRAMUSCULAR | Status: AC
  Administered 2023-07-21: 10 mg via INTRAVENOUS
  Filled 2023-07-21: qty 2

## 2023-07-21 MED ORDER — MAGNESIUM SULFATE 40 GM/1000ML IV SOLN
2.0000 g/h | INTRAVENOUS | Status: DC
Start: 2023-07-21 — End: 2023-07-22

## 2023-07-21 MED ORDER — FENTANYL CITRATE (PF) 100 MCG/2ML IJ SOLN
100.0000 ug | INTRAMUSCULAR | Status: DC | PRN
  Administered 2023-07-21 – 2023-07-22 (×6): 100 ug via INTRAVENOUS
  Administered 2023-07-22: 50 ug via INTRAVENOUS
  Administered 2023-07-22 (×2): 100 ug via INTRAVENOUS
  Administered 2023-07-22: 50 ug via INTRAVENOUS
  Filled 2023-07-21 (×13): qty 2

## 2023-07-21 MED ORDER — LORAZEPAM 2 MG/ML IJ SOLN
1.0000 mg | INTRAMUSCULAR | Status: AC
  Administered 2023-07-21: 1 mg via INTRAVENOUS

## 2023-07-21 MED ORDER — LORAZEPAM 2 MG/ML IJ SOLN
INTRAMUSCULAR | Status: AC
  Administered 2023-07-21 (×2): 1 mg
  Filled 2023-07-21: qty 1

## 2023-07-21 MED ORDER — LACTATED RINGERS IV SOLN
INTRAVENOUS | Status: DC
Start: 2023-07-21 — End: 2023-07-22

## 2023-07-21 MED ORDER — ONDANSETRON 4 MG PO TBDP: 6.0000 mg | ORAL_TABLET | Freq: Four times a day (QID) | ORAL | Status: DC | PRN

## 2023-07-21 MED ORDER — LABETALOL HCL 200 MG PO TABS
600.0000 mg | ORAL_TABLET | Freq: Four times a day (QID) | ORAL | Status: DC
  Administered 2023-07-21 (×2): 600 mg via ORAL
  Filled 2023-07-21 (×2): qty 3

## 2023-07-21 MED ORDER — MAGNESIUM SULFATE 40 GM/1000ML IV SOLN
INTRAVENOUS | Status: AC
  Filled 2023-07-21: qty 1000

## 2023-07-21 MED ORDER — NITROGLYCERIN 0.4 MG SL SUBL: 0.4000 mg | SUBLINGUAL_TABLET | SUBLINGUAL | Status: DC | PRN

## 2023-07-21 MED ORDER — LABETALOL HCL 200 MG PO TABS: 400.0000 mg | ORAL_TABLET | Freq: Two times a day (BID) | ORAL | Status: DC

## 2023-07-21 MED ORDER — MAGNESIUM SULFATE BOLUS VIA INFUSION
4.0000 g | Freq: Once | INTRAVENOUS | Status: AC
  Administered 2023-07-21: 4 g via INTRAVENOUS

## 2023-07-21 MED ORDER — INSULIN ASPART 100 UNIT/ML IJ SOLN: 0.0000 [IU] | Freq: Three times a day (TID) | INTRAMUSCULAR | Status: DC

## 2023-07-21 NOTE — Consult Note (Addendum)
[facility]  Prenatal Consult       07/21/2023  6:39 PM   I was asked by Dr. Normand Sloop to consult on this patient for anticipated preterm delivery. I had the pleasure of meeting with Hayley Webb today with her mother present in the room and her husband on speaker phone. She is a 37 year old [phone] woman who is currently at [redacted]w[redacted]d. 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 She has now developed pre-eclampsia with severe features (history of the same in prior pregnancies) for which she is currently being managed. She and her partner are expecting a baby boy, to be named Hayley Webb, Hayley Webb. Recent EFW on 07/10/23 was 2159 grams (80th%ile). Hayley Webb had possible seizure activity today with normal head CT/CTA and is on magnesium and seizure precautions. The plan is for IOL tomorrow, with delivery sooner if indicated. Antenatal steroids given (s/p two doses of dexamethasone, beginning yesterday, in lieu of betamethasone). Of note, Hayley Webb's last baby Hayley Webb) was born at 26 weeks' gestation in 2023 and was in our NICU for ~3 weeks and had a smooth hospital course (brief CPAP, progressed normally, and her recollection is that he was discharged at ~35 weeks corrected age).   We discussed that the fetal echocardiograms have been normal/reassuring this pregnancy without signs of heart block or carditis (her oldest son had 1st degree heart block which was treated and did not progress, other children have not had cardiac issues). We also discussed that her baby is growing well. We will plan to check an EKG in the days after birth to evaluate for heart block. Otherwise, we discussed the usual expectations for a baby at this gestational age. She inquired about his need for the NICU, and I explained that babies born at <35w gestation do automatically come to the NICU due to  anticipated needs related to prematurity, including possible respiratory support, feeding difficulties needing gavage support, low blood sugar risk, temperature regulation, and vital signs monitoring. I did state that if Hayley Webb looks well in the DR and is not needing respiratory support, he may do skin-to-skin for a few minutes before coming to the NICU.  We discussed the average length of stay but I noted that the actual LOS would depend on the severity of problems encountered and response to treatments. We discussed our updated visitation policies and the resources available while her child is in the hospital.  We discussed the importance of good nutrition and various methods of providing nutrition. We discussed the benefits of human milk. She intends to breast feed. I encouraged pumping soon after birth and outlined resources that are available to support breast feeding. We discussed the possibility of using donor breast milk as a bridge.  Mother inquired about other screenings he will need and we discussed this.  Thank you for involving [place] in the care of this patient. A member of our team will be available should the family have additional questions. Time for consultation: 40 minutes, with approximately 20 minutes of face-to-face time in discussion of the risks and Hayley Webb care associated with preterm delivery.  Jacob Moores, MD Neonatal Medicine

## 2023-07-21 NOTE — Significant Event (Addendum)
Rapid Response Event Note   Reason for Call :  Elevated HR, chest pain with pain shooting down arm  Initial Focused Assessment:  Patient lying in bed with eyes shut, responds to voice with complaints of 9/10 headache and chest pain that is shooting down left arm. Headache and right upper quadrant pain has been ongoing, however chest/arm pain is new. Tachycardia increasing this AM. Patient denies feeling SOB, difficulty breathing, fluttering; some tachypnea without distress noted. Lungs clear. Heart tones normal. Skin warm/dry, bilateral LE edema present. Upper and lower lips noted to have petechial markings, per RN not present earlier. While in room tremor in right hand began approximately 1043; tremor then spread up right arm to head and left arm. While in CT, tremor worsening, tremor now noted in all extremities. See MAR for ativan administration per MD Dillard.  174/92  HR 138 RR 22 O2 98% 2L CBG 222  Interventions:  EKG: ST Labs Labetalol x2 20mg  followed by 40mg , see MAR Morphine 1mg  x1, see MAR Ativan 1mg  x3, see MAR MD to bedside  CT head, CTA chest  Post ativan administration patient responding to voice and/or light stimulation, follows commands with prompting. Tremors still present.   Plan of Care:  Await CTA results, possible IV mag gtt administration per MD, possible section per MD--OR and NICU made aware by team. Husband and patient mother at bedside and aware.  Event Summary:  MD Notified: Audree Camel MD Call Time: 2130 Arrival Time: 1029 End Time: 1210  1615 Addendum:  Patient A&Ox4, eyes open and smiling in bed. Mother at bedside. Pt states feeling much better than this AM, minor headache, dull chest pain, and still some RUQ pain; recently received fentanyl which helped.   Truddie Crumble, RN

## 2023-07-21 NOTE — Progress Notes (Signed)
Subjective:    Reports improvement of HA after second IV HA cocktail.  Objective:    VS: BP (!) 135/99 (BP Location: Right Arm)   Pulse (!) 118   Temp 97.8 F (36.6 C) (Oral)   Resp 19   Ht 5' 8.5" (1.74 m)   Wt (!) 145.5 kg   LMP 11/18/2022   SpO2 99%   BMI 48.07 kg/m  FHR : baseline 125 / variability moderate / accelerations present / absent decelerations Toco: contractions occasional, mild Membranes: intact    Assessment/Plan:   37 y.o. [id number] [redacted]w[redacted]d  Chronic hypertension in pregnancy w/o proteinuria    -New onset headache    -Labs are grossly normal    -Hx of pre-eclampsia with severe features    -Labetalol increased to 300 mg TID Reactive NST Observe today for continued HA relief   Anticipate D/C within 23 hr period Next appointment on 07/26/23   Roma Schanz DNP, [name] 07/21/2023 6:05 AM

## 2023-07-21 NOTE — Progress Notes (Signed)
Called to pts. Room.  Pt. In bed semi fowlers position. She is complaining of severe chest pain radiating down her left arm.  Pt. Is slightly short of breath (see vital signs).  Oxygen applied @ 2l per [employer].  Consulting civil engineer called to room Kandice Hams, Charity fundraiser).  Adult Rapid Response called to bedside for further evaluation of pt.. Dr. Normand Sloop notified and further orders received.  Dr. Normand Sloop enroute to bedside.

## 2023-07-21 NOTE — Progress Notes (Signed)
Patient ID: Hayley Webb, female   DOB: 04-30-1986, 37 y.o.   MRN: [id number] Pt c/o CP and headache BP (!) 140/96   Pulse (!) 108   Temp 98.4 F (36.9 C) (Oral)   Resp (!) 24   Ht 5' 8.5" (1.74 m)   Wt (!) 145.5 kg   LMP 11/18/2022   SpO2 95%   BMI 48.07 kg/m ' She is alert and awake Plan fentanyl for headache pain and labor. She does not want an epidural We will resume lovenox Pt and family agree with the plan Fetal monitoring is reassuring and NICU and anesthesia are aware and will see her

## 2023-07-21 NOTE — Progress Notes (Signed)
Pt without complaints.  No leakage of fluid or VB.  Good FM  BP (!) 140/96   Pulse (!) 108   Temp 98.4 F (36.9 C) (Oral)   Resp (!) 24   Ht 5' 8.5" (1.74 m)   Wt (!) 145.5 kg   LMP 11/18/2022   SpO2 95%   BMI 48.07 kg/m   FHTS Baseline: 130 bpm cat 1  Toco none  Pt in NAD CV RRR Lungs CTAB abd  Gravid soft and NT GU no vb EXt no calf tenderness Results for orders placed or performed during the hospital encounter of 07/20/23 (from the past 72 hours)  CBC     Status: Abnormal   Collection Time: 07/20/23 11:31 AM  Result Value Ref Range   WBC 5.3 4.0 - 10.5 K/uL   RBC 3.98 3.87 - 5.11 MIL/uL   Hemoglobin 10.9 (L) 12.0 - 15.0 g/dL   HCT 27.2 (L) 53.6 - 64.4 %   MCV 83.2 80.0 - 100.0 fL   MCH 27.4 26.0 - 34.0 pg   MCHC 32.9 30.0 - 36.0 g/dL   RDW 03.4 74.2 - 59.5 %   Platelets 285 150 - 400 K/uL   nRBC 0.0 0.0 - 0.2 %    Comment: Performed at [facility], [address], [place], [place] [address]  Comprehensive metabolic panel     Status: Abnormal   Collection Time: 07/20/23 11:31 AM  Result Value Ref Range   Sodium 136 135 - 145 mmol/L   Potassium 3.1 (L) 3.5 - 5.1 mmol/L   Chloride 104 98 - 111 mmol/L   CO2 16 (L) 22 - 32 mmol/L   Glucose, Bld 181 (H) 70 - 99 mg/dL    Comment: Glucose reference range applies only to samples taken after fasting for at least 8 hours.   BUN 5 (L) 6 - 20 mg/dL   Creatinine, Ser 6.43 0.44 - 1.00 mg/dL   Calcium 9.2 8.9 - 32.9 mg/dL   Total Protein 6.6 6.5 - 8.1 g/dL   Albumin 2.8 (L) 3.5 - 5.0 g/dL   AST 20 15 - 41 U/L   ALT 13 0 - 44 U/L   Alkaline Phosphatase 127 (H) 38 - 126 U/L   Total Bilirubin 0.3 <1.2 mg/dL   GFR, Estimated >51 >88 mL/min    Comment: (NOTE) Calculated using the CKD-EPI Creatinine Equation (2021)    Anion gap 16 (H) 5 - 15    Comment: Performed at [facility], [address], [place], [place] [address]  Protein / creatinine ratio, urine     Status: None   Collection Time: 07/20/23 11:32 AM   Result Value Ref Range   Creatinine, Urine 341 mg/dL   Total Protein, Urine 45 mg/dL    Comment: NO NORMAL RANGE ESTABLISHED FOR THIS TEST   Protein Creatinine Ratio 0.13 0.00 - 0.15 mg/mg[Cre]    Comment: Performed at [facility], [address] [place], [place] [address]  CBC     Status: Abnormal   Collection Time: 07/21/23 10:48 AM  Result Value Ref Range   WBC 6.8 4.0 - 10.5 K/uL   RBC 4.02 3.87 - 5.11 MIL/uL   Hemoglobin 11.0 (L) 12.0 - 15.0 g/dL   HCT 01.0 (L) 93.2 - 35.5 %   MCV 83.3 80.0 - 100.0 fL   MCH 27.4 26.0 - 34.0 pg   MCHC 32.8 30.0 - 36.0 g/dL   RDW 73.2 20.2 - 54.2 %  Platelets 284 150 - 400 K/uL   nRBC 0.0 0.0 - 0.2 %    Comment: Performed at [facility], [address] [place], [place] [address]  Basic metabolic panel     Status: Abnormal   Collection Time: 07/21/23 10:48 AM  Result Value Ref Range   Sodium 132 (L) 135 - 145 mmol/L   Potassium 3.6 3.5 - 5.1 mmol/L   Chloride 105 98 - 111 mmol/L   CO2 13 (L) 22 - 32 mmol/L   Glucose, Bld 215 (H) 70 - 99 mg/dL    Comment: Glucose reference range applies only to samples taken after fasting for at least 8 hours.   BUN <5 (L) 6 - 20 mg/dL   Creatinine, Ser 3.29 0.44 - 1.00 mg/dL   Calcium 9.3 8.9 - 51.8 mg/dL   GFR, Estimated >84 >16 mL/min    Comment: (NOTE) Calculated using the CKD-EPI Creatinine Equation (2021)    Anion gap 14 5 - 15    Comment: Performed at [facility], [address], [place], [place] [address]  Troponin I (High Sensitivity)     Status: None   Collection Time: 07/21/23 10:48 AM  Result Value Ref Range   Troponin I (High Sensitivity) 5 <18 ng/L    Comment: (NOTE) Elevated high sensitivity troponin I (hsTnI) values and significant  changes across serial measurements may suggest ACS but many other  chronic and acute conditions are known to elevate hsTnI results.  Refer to the "Links" section for chest pain algorithms and additional  guidance. Performed at  [facility], [address], [place], [place] [address]   D-dimer, quantitative     Status: Abnormal   Collection Time: 07/21/23 10:48 AM  Result Value Ref Range   D-Dimer, Quant 0.90 (H) 0.00 - 0.50 ug/mL-FEU    Comment: (NOTE) At the manufacturer cut-off value of 0.5 g/mL FEU, this assay has a negative predictive value of 95-100%.This assay is intended for use in conjunction with a clinical pretest probability (PTP) assessment model to exclude pulmonary embolism (PE) and deep venous thrombosis (DVT) in outpatients suspected of PE or DVT. Results should be correlated with clinical presentation. Performed at [facility], [address], [place], [place] [address]   Hepatic function panel     Status: Abnormal   Collection Time: 07/21/23 10:48 AM  Result Value Ref Range   Total Protein 6.7 6.5 - 8.1 g/dL   Albumin 2.8 (L) 3.5 - 5.0 g/dL   AST 23 15 - 41 U/L   ALT 13 0 - 44 U/L   Alkaline Phosphatase 127 (H) 38 - 126 U/L   Total Bilirubin 0.3 <1.2 mg/dL   Bilirubin, Direct <5.7 0.0 - 0.2 mg/dL   Indirect Bilirubin NOT CALCULATED 0.3 - 0.9 mg/dL    Comment: Performed at [facility], [address], [place], [place] [address]  Glucose, capillary     Status: Abnormal   Collection Time: 07/21/23 10:52 AM  Result Value Ref Range   Glucose-Capillary 222 (H) 70 - 99 mg/dL    Comment: Glucose reference range applies only to samples taken after fasting for at least 8 hours.    Assessment and Plan [redacted]w[redacted]d  Preeclmpsia with severe features atypical presentation May have had some seizure activity CT angio and head CT neg Started on magnesium and was given ativan Glucose coverage started glucose is elevted Labs WNL Vitals are stable will give magnesium and labetalol Plan induction  24 hours after dexamethasone NICU notified GBS sent Dr Parke Poisson agrees with plan of care.

## 2023-07-22 ENCOUNTER — Inpatient Hospital Stay (HOSPITAL_COMMUNITY): Payer: Medicaid Other | Admitting: Anesthesiology

## 2023-07-22 ENCOUNTER — Observation Stay (HOSPITAL_COMMUNITY): Payer: Medicaid Other

## 2023-07-22 DIAGNOSIS — Z8616 Personal history of COVID-19: Secondary | ICD-10-CM | POA: Diagnosis not present

## 2023-07-22 DIAGNOSIS — O114 Pre-existing hypertension with pre-eclampsia, complicating childbirth: Secondary | ICD-10-CM | POA: Diagnosis not present

## 2023-07-22 DIAGNOSIS — O9832 Other infections with a predominantly sexual mode of transmission complicating childbirth: Secondary | ICD-10-CM | POA: Diagnosis not present

## 2023-07-22 LAB — COMPREHENSIVE METABOLIC PANEL
ALT: 11 U/L (ref 0–44)
AST: 17 U/L (ref 15–41)
Albumin: 2.5 g/dL — ABNORMAL LOW (ref 3.5–5.0)
Alkaline Phosphatase: 105 U/L (ref 38–126)
Anion gap: 11 (ref 5–15)
BUN: <5 mg/dL — ABNORMAL LOW (ref 6–20)
CO2: 18 mmol/L — ABNORMAL LOW (ref 22–32)
Calcium: 8.1 mg/dL — ABNORMAL LOW (ref 8.9–10.3)
Chloride: 105 mmol/L (ref 98–111)
Creatinine, Ser: 0.7 mg/dL (ref 0.44–1.00)
GFR, Estimated: >60 mL/min (ref >60)
Glucose, Bld: 169 mg/dL — ABNORMAL HIGH (ref 70–99)
Potassium: 3 mmol/L — ABNORMAL LOW (ref 3.5–5.1)
Sodium: 134 mmol/L — ABNORMAL LOW (ref 135–145)
Total Bilirubin: 0.3 mg/dL (ref <1.2)
Total Protein: 5.8 g/dL — ABNORMAL LOW (ref 6.5–8.1)

## 2023-07-22 LAB — CBC
HCT: 29.3 % — ABNORMAL LOW (ref 36.0–46.0)
HCT: 31.8 % — ABNORMAL LOW (ref 36.0–46.0)
Hemoglobin: 9.6 g/dL — ABNORMAL LOW (ref 12.0–15.0)
Hemoglobin: 10.4 g/dL — ABNORMAL LOW (ref 12.0–15.0)
MCH: 27.5 pg (ref 26.0–34.0)
MCH: 27.3 pg (ref 26.0–34.0)
MCHC: 32.8 g/dL (ref 30.0–36.0)
MCHC: 32.7 g/dL (ref 30.0–36.0)
MCV: 84 fL (ref 80.0–100.0)
MCV: 83.5 fL (ref 80.0–100.0)
Platelets: 264 10*3/uL (ref 150–400)
Platelets: 293 10*3/uL (ref 150–400)
RBC: 3.49 MIL/uL — ABNORMAL LOW (ref 3.87–5.11)
RBC: 3.81 MIL/uL — ABNORMAL LOW (ref 3.87–5.11)
RDW: 15.3 % (ref 11.5–15.5)
RDW: 15.8 % — ABNORMAL HIGH (ref 11.5–15.5)
WBC: 9.1 10*3/uL (ref 4.0–10.5)
WBC: 7.4 10*3/uL (ref 4.0–10.5)
nRBC: 0 % (ref 0.0–0.2)
nRBC: 0 % (ref 0.0–0.2)

## 2023-07-22 LAB — GLUCOSE, CAPILLARY
Glucose-Capillary: 124 mg/dL — ABNORMAL HIGH (ref 70–99)
Glucose-Capillary: 130 mg/dL — ABNORMAL HIGH (ref 70–99)

## 2023-07-22 LAB — MAGNESIUM: Magnesium: 2.7 mg/dL — ABNORMAL HIGH (ref 1.7–2.4)

## 2023-07-22 MED ORDER — PHENYLEPHRINE 80 MCG/ML (10ML) SYRINGE FOR IV PUSH (FOR BLOOD PRESSURE SUPPORT): 80.0000 ug | PREFILLED_SYRINGE | INTRAVENOUS | Status: DC | PRN

## 2023-07-22 MED ORDER — EPHEDRINE 5 MG/ML INJ: 10.0000 mg | INTRAVENOUS | Status: DC | PRN

## 2023-07-22 MED ORDER — MISOPROSTOL 25 MCG QUARTER TABLET
25.0000 ug | ORAL_TABLET | ORAL | Status: AC
  Administered 2023-07-22 (×3): 25 ug via VAGINAL
  Filled 2023-07-22 (×3): qty 1

## 2023-07-22 MED ORDER — OXYCODONE-ACETAMINOPHEN 5-325 MG PO TABS: 2.0000 | ORAL_TABLET | ORAL | Status: DC | PRN

## 2023-07-22 MED ORDER — DIPHENHYDRAMINE HCL 50 MG/ML IJ SOLN: 12.5000 mg | INTRAMUSCULAR | Status: DC | PRN

## 2023-07-22 MED ORDER — LACTATED RINGERS IV SOLN: 500.0000 mL | INTRAVENOUS | Status: AC | PRN

## 2023-07-22 MED ORDER — ONDANSETRON HCL 4 MG/2ML IJ SOLN
4.0000 mg | Freq: Four times a day (QID) | INTRAMUSCULAR | Status: DC | PRN
  Administered 2023-07-23: 4 mg via INTRAVENOUS
  Filled 2023-07-22: qty 2

## 2023-07-22 MED ORDER — PENICILLIN G POT IN DEXTROSE 60000 UNIT/ML IV SOLN
3.0000 10*6.[IU] | INTRAVENOUS | Status: DC
  Administered 2023-07-22 – 2023-07-23 (×8): 3 10*6.[IU] via INTRAVENOUS
  Filled 2023-07-22 (×13): qty 50

## 2023-07-22 MED ORDER — OXYTOCIN BOLUS FROM INFUSION
333.0000 mL | Freq: Once | INTRAVENOUS | Status: AC
  Administered 2023-07-23: 333 mL via INTRAVENOUS

## 2023-07-22 MED ORDER — FENTANYL-BUPIVACAINE-NACL 0.5-0.125-0.9 MG/250ML-% EP SOLN
12.0000 mL/h | EPIDURAL | Status: DC | PRN
Start: 2023-07-22 — End: 2023-07-24
  Administered 2023-07-22 – 2023-07-23 (×2): 12 mL/h via EPIDURAL
  Filled 2023-07-22 (×3): qty 250

## 2023-07-22 MED ORDER — BUPIVACAINE HCL (PF) 0.25 % IJ SOLN
INTRAMUSCULAR | Status: DC | PRN
  Administered 2023-07-22 (×2): 4 mL via EPIDURAL
  Administered 2023-07-23: 8 mL via EPIDURAL

## 2023-07-22 MED ORDER — LACTATED RINGERS IV SOLN
500.0000 mL | Freq: Once | INTRAVENOUS | Status: AC
  Administered 2023-07-22: 500 mL via INTRAVENOUS

## 2023-07-22 MED ORDER — OXYCODONE-ACETAMINOPHEN 5-325 MG PO TABS: 1.0000 | ORAL_TABLET | ORAL | Status: DC | PRN

## 2023-07-22 MED ORDER — SOD CITRATE-CITRIC ACID 500-334 MG/5ML PO SOLN
30.0000 mL | ORAL | Status: DC | PRN
Start: 2023-07-22 — End: 2023-07-26

## 2023-07-22 MED ORDER — PHENYLEPHRINE 80 MCG/ML (10ML) SYRINGE FOR IV PUSH (FOR BLOOD PRESSURE SUPPORT)
80.0000 ug | PREFILLED_SYRINGE | INTRAVENOUS | Status: DC | PRN
  Filled 2023-07-22 (×2): qty 10

## 2023-07-22 MED ORDER — SODIUM CHLORIDE 0.9 % IV SOLN
5.0000 10*6.[IU] | Freq: Once | INTRAVENOUS | Status: AC
  Administered 2023-07-22: 5 10*6.[IU] via INTRAVENOUS
  Filled 2023-07-22: qty 5

## 2023-07-22 MED ORDER — OXYTOCIN-SODIUM CHLORIDE 30-0.9 UT/500ML-% IV SOLN
2.5000 [IU]/h | INTRAVENOUS | Status: DC
  Filled 2023-07-22: qty 500

## 2023-07-22 MED ORDER — LIDOCAINE HCL (PF) 1 % IJ SOLN: 30.0000 mL | INTRAMUSCULAR | Status: DC | PRN

## 2023-07-22 MED ORDER — LORAZEPAM 2 MG/ML IJ SOLN
2.0000 mg | Freq: Once | INTRAVENOUS | Status: DC
Start: 2023-07-22 — End: 2023-07-22
  Filled 2023-07-22: qty 1

## 2023-07-22 MED ORDER — LIDOCAINE-EPINEPHRINE (PF) 2 %-1:200000 IJ SOLN
INTRAMUSCULAR | Status: DC | PRN
  Administered 2023-07-22: 3 mL via EPIDURAL

## 2023-07-22 MED ORDER — LORAZEPAM 2 MG/ML IJ SOLN: 2.0000 mg | Freq: Once | INTRAMUSCULAR | Status: DC

## 2023-07-22 MED ORDER — TERBUTALINE SULFATE 1 MG/ML IJ SOLN: 0.2500 mg | Freq: Once | INTRAMUSCULAR | Status: DC | PRN

## 2023-07-22 MED ORDER — MAGNESIUM SULFATE 40 GM/1000ML IV SOLN
2.0000 g/h | INTRAVENOUS | Status: AC
  Administered 2023-07-22 – 2023-07-24 (×3): 2 g/h via INTRAVENOUS
  Filled 2023-07-22 (×3): qty 1000

## 2023-07-22 MED ORDER — LACTATED RINGERS IV SOLN: INTRAVENOUS | Status: AC

## 2023-07-22 MED ORDER — OXYTOCIN-SODIUM CHLORIDE 30-0.9 UT/500ML-% IV SOLN
1.0000 m[IU]/min | INTRAVENOUS | Status: DC
  Administered 2023-07-22: 2 m[IU]/min via INTRAVENOUS

## 2023-07-22 MED ORDER — GLUCAGON HCL RDNA (DIAGNOSTIC) 1 MG IJ SOLR
1.0000 mg | Freq: Once | INTRAMUSCULAR | Status: AC | PRN
  Administered 2023-07-22: 1 mg via INTRAVENOUS

## 2023-07-22 MED ORDER — MAGNESIUM SULFATE 2 GM/50ML IV SOLN: 2.0000 g | Freq: Once | INTRAVENOUS | Status: DC

## 2023-07-22 NOTE — Progress Notes (Signed)
after explaning to pt that if she had Lovenox now she would not be able to get an epidural until 1000 12-30 -24 and even if she wants epidural before 2200 she still would not be aboe to have one due to risk of bldg. Pt understands that she MUST wait until after 2200 ttodayo get an epidural

## 2023-07-22 NOTE — Progress Notes (Signed)
Hospital rapid response nurse called and given a short report of patient's complaints of chest pain

## 2023-07-22 NOTE — Progress Notes (Signed)
Patient ID: Hayley Webb, female   DOB: 1985/08/05, 37 y.o.   MRN: [id number] Pt lying in bed with a headache BP 134/73   Pulse 76   Temp 97.7 F (36.5 C) (Oral)   Resp 20   Ht 5' 8.5" (1.74 m)   Wt (!) 145.5 kg   LMP 11/18/2022   SpO2 97%   BMI 48.07 kg/m  Physical Examination: General appearance - alert, well appearing, and in no distress Chest - clear to auscultation, no wheezes, rales or rhonchi, symmetric air entry Heart - normal rate and regular rhythm Abdomen - gravid and soft Extremities - peripheral pulses normal, no pedal edema, no clubbing or cyanosis, Homan's sign negative bilaterally  CBC    Component Value Date/Time   WBC 9.1 07/22/2023 0123   RBC 3.49 (L) 07/22/2023 0123   HGB 9.6 (L) 07/22/2023 0123   HGB 12.6 05/03/2020 1617   HCT 29.3 (L) 07/22/2023 0123   HCT 38.1 05/03/2020 1617   PLT 264 07/22/2023 0123   PLT 354 02/01/2017 1108   MCV 84.0 07/22/2023 0123   MCV 87 05/03/2020 1617   MCH 27.5 07/22/2023 0123   MCHC 32.8 07/22/2023 0123   RDW 15.3 07/22/2023 0123   RDW 14.0 05/03/2020 1617   LYMPHSABS 2.3 02/19/2023 1440   LYMPHSABS 2.0 05/03/2020 1617   MONOABS 0.3 02/19/2023 1440   EOSABS 0.2 02/19/2023 1440   EOSABS 0.3 05/03/2020 1617   BASOSABS 0.0 02/19/2023 1440   BASOSABS 0.0 05/03/2020 1617      Latest Ref Rng & Units 07/22/2023    1:23 AM 07/21/2023   10:48 AM 07/20/2023   11:31 AM  CMP  Glucose 70 - 99 mg/dL 102  725  366   BUN 6 - 20 mg/dL <5  <5  5   Creatinine 0.44 - 1.00 mg/dL 4.40  3.47  4.25   Sodium 135 - 145 mmol/L 134  132  136   Potassium 3.5 - 5.1 mmol/L 3.0  3.6  3.1   Chloride 98 - 111 mmol/L 105  105  104   CO2 22 - 32 mmol/L 18  13  16    Calcium 8.9 - 10.3 mg/dL 8.1  9.3  9.2   Total Protein 6.5 - 8.1 g/dL 5.8  6.7  6.6   Total Bilirubin <1.2 mg/dL 0.3  0.3  0.3   Alkaline Phos 38 - 126 U/L 105  127  127   AST 15 - 41 U/L 17  23  20    ALT 0 - 44 U/L 11  13  13     Preeclampsia with severe features BP  controlled Labs stable will check magnesium Vytotec for cervical ripening Cat 1  fetal tracing reassuring

## 2023-07-22 NOTE — Progress Notes (Signed)
RN called to Pt. Room at 2335. Pt. States she needs to use the bathroom and RN attempts to get Pt. on bed pain. Pt. Begins complaining that she feels like she is going to pass out. RN uses ammonia inhalant to try and keep Pt. aroused. MD was called at 2353 and BP at that time was 72/32. The Pt began passing out. RN ended phone call with MD and called rapid response.

## 2023-07-22 NOTE — Significant Event (Signed)
Rapid Response Event Note   Reason for Call :  Chest pain with left arm pain 9/10  Initial Focused Assessment:  Patient sitting up in bed A&O x4 smiling when this RN entered recognizing name/face from yesterday. Complaint of squeezing mid sternal chest pain that radiates down left arm. Still with headache and RUQ discomfort. Skin warm and dry. Lungs clear, heart tones normal.   145/92 HR 78 RR 18 O2 99% RA  Interventions/Plan of Care:  EKG: NSR MD notified Mag gtt ordered  Event Summary:  MD NotifiedAudree Camel MD Call Time: 1207 Arrival Time: 1212 End Time:  Truddie Crumble, RN

## 2023-07-22 NOTE — Progress Notes (Signed)
Skin warm and dry, no apparent distress noted, no SOB

## 2023-07-22 NOTE — Plan of Care (Signed)
Problem: Education: Goal: Knowledge of disease or condition will improve Outcome: Progressing Goal: Knowledge of the prescribed therapeutic regimen will improve Outcome: Progressing   Problem: Fluid Volume: Goal: Peripheral tissue perfusion will improve Outcome: Progressing   Problem: Clinical Measurements: Goal: Complications related to disease process, condition or treatment will be avoided or minimized Outcome: Progressing   Problem: Education: Goal: Knowledge of disease or condition will improve Outcome: Progressing Goal: Knowledge of the prescribed therapeutic regimen will improve Outcome: Progressing Goal: Individualized Educational Video(s) Outcome: Progressing   Problem: Clinical Measurements: Goal: Complications related to the disease process, condition or treatment will be avoided or minimized Outcome: Progressing   Problem: Education: Goal: Knowledge of General Education information will improve Description: Including pain rating scale, medication(s)/side effects and non-pharmacologic comfort measures Outcome: Progressing   Problem: Health Behavior/Discharge Planning: Goal: Ability to manage health-related needs will improve Outcome: Progressing   Problem: Clinical Measurements: Goal: Ability to maintain clinical measurements within normal limits will improve Outcome: Progressing Goal: Will remain free from infection Outcome: Progressing Goal: Diagnostic test results will improve Outcome: Progressing Goal: Respiratory complications will improve Outcome: Progressing Goal: Cardiovascular complication will be avoided Outcome: Progressing   Problem: Activity: Goal: Risk for activity intolerance will decrease Outcome: Progressing   Problem: Nutrition: Goal: Adequate nutrition will be maintained Outcome: Progressing   Problem: Coping: Goal: Level of anxiety will decrease Outcome: Progressing   Problem: Elimination: Goal: Will not experience  complications related to bowel motility Outcome: Progressing Goal: Will not experience complications related to urinary retention Outcome: Progressing   Problem: Pain Management: Goal: General experience of comfort will improve Outcome: Progressing   Problem: Safety: Goal: Ability to remain free from injury will improve Outcome: Progressing   Problem: Skin Integrity: Goal: Risk for impaired skin integrity will decrease Outcome: Progressing   Problem: Education: Goal: Knowledge of General Education information will improve Description: Including pain rating scale, medication(s)/side effects and non-pharmacologic comfort measures Outcome: Progressing   Problem: Health Behavior/Discharge Planning: Goal: Ability to manage health-related needs will improve Outcome: Progressing   Problem: Clinical Measurements: Goal: Ability to maintain clinical measurements within normal limits will improve Outcome: Progressing Goal: Will remain free from infection Outcome: Progressing Goal: Diagnostic test results will improve Outcome: Progressing Goal: Respiratory complications will improve Outcome: Progressing Goal: Cardiovascular complication will be avoided Outcome: Progressing   Problem: Activity: Goal: Risk for activity intolerance will decrease Outcome: Progressing   Problem: Nutrition: Goal: Adequate nutrition will be maintained Outcome: Progressing   Problem: Coping: Goal: Level of anxiety will decrease Outcome: Progressing   Problem: Elimination: Goal: Will not experience complications related to bowel motility Outcome: Progressing Goal: Will not experience complications related to urinary retention Outcome: Progressing   Problem: Pain Management: Goal: General experience of comfort will improve Outcome: Progressing   Problem: Safety: Goal: Ability to remain free from injury will improve Outcome: Progressing   Problem: Skin Integrity: Goal: Risk for impaired skin  integrity will decrease Outcome: Progressing   Problem: Education: Goal: Ability to describe self-care measures that may prevent or decrease complications (Diabetes Survival Skills Education) will improve Outcome: Progressing Goal: Individualized Educational Video(s) Outcome: Progressing   Problem: Coping: Goal: Ability to adjust to condition or change in health will improve Outcome: Progressing   Problem: Fluid Volume: Goal: Ability to maintain a balanced intake and output will improve Outcome: Progressing   Problem: Health Behavior/Discharge Planning: Goal: Ability to identify and utilize available resources and services will improve Outcome: Progressing Goal: Ability to manage health-related needs will improve  Outcome: Progressing   Problem: Metabolic: Goal: Ability to maintain appropriate glucose levels will improve Outcome: Progressing

## 2023-07-22 NOTE — Anesthesia Procedure Notes (Signed)
Epidural Patient location during procedure: OB Start time: 07/22/2023 11:41 PM End time: 07/22/2023 11:53 PM  Staffing Anesthesiologist: Val Eagle, MD Performed: anesthesiologist   Preanesthetic Checklist Completed: patient identified, IV checked, risks and benefits discussed, monitors and equipment checked, pre-op evaluation and timeout performed  Epidural Patient position: sitting Prep: DuraPrep Patient monitoring: heart rate, continuous pulse ox and blood pressure Approach: midline Location: L4-L5 Injection technique: LOR saline  Needle:  Needle type: Tuohy  Needle gauge: 17 G Needle length: 9 cm Needle insertion depth: 9 cm Catheter type: closed end flexible Catheter size: 19 Gauge Catheter at skin depth: 17 cm Test dose: negative and 2% lidocaine with Epi 1:200 K  Assessment Events: blood not aspirated, no cerebrospinal fluid, injection not painful, no injection resistance, no paresthesia and negative IV test  Additional Notes Reason for block:procedure for pain

## 2023-07-22 NOTE — Hospital Course (Signed)
Pt comfortable BP 137/83   Pulse 79   Temp 97.9 F (36.6 C) (Oral)   Resp 17   Ht 5' 8.5" (1.74 m)   Wt (!) 145.5 kg   LMP 11/18/2022   SpO2 97%   BMI 48.07 kg/m  Cx 4 cm Continue pitocin Strict I/O Magnesium for seizure prophylaxsis BP stable Monitor closely Anticipate SVD

## 2023-07-22 NOTE — Progress Notes (Signed)
Patient ID: Hayley Webb, female   DOB: [birth year], 37 y.o.   MRN: [id number] Pt c/o feeling light headed.  No HA no LOC BP (!) 89/59   Pulse (!) 113   Temp 98.4 F (36.9 C) (Oral)   Resp 20   Ht 5' 8.5" (1.74 m)   Wt (!) 145.5 kg   LMP 11/18/2022   SpO2 97%   BMI 48.07 kg/m  Cat 1  Toco no contractions Lungs CTA B  Abd gravid soft nit Cx  long and  closed Ext no calf tenderness Pt with low blood pressures Rapid response called.  Magnesium dcd.  Glucogon given with fluid bolus Pt began to recover  Will check labs and mag level Hold labetalol and magnesium.  MFM desires to restart magnesium.  Will place ativan by the bedside and restart once blood pressures stable and mag level resulted Will give cytotec Pt and family agree with the IOL Monitor closely

## 2023-07-22 NOTE — Anesthesia Preprocedure Evaluation (Signed)
Anesthesia Evaluation  Patient identified by MRN, date of birth, ID band Patient awake    Reviewed: Allergy & Precautions, Patient's Chart, lab work & pertinent test results  History of Anesthesia Complications Negative for: history of anesthetic complications  Airway Mallampati: IV  TM Distance: >3 FB Neck ROM: Full    Dental  (+) Dental Advisory Given   Pulmonary asthma , PE H/o PE on anticoagulation   breath sounds clear to auscultation       Cardiovascular hypertension, Pt. on home beta blockers  Rhythm:Regular  Eclampsia   Neuro/Psych  Headaches, Seizures -,  PSYCHIATRIC DISORDERS Anxiety        GI/Hepatic negative GI ROS, Neg liver ROS,,,  Endo/Other  negative endocrine ROS    Renal/GU negative Renal ROSLab Results      Component                Value               Date                      NA                       134 (L)             07/22/2023                K                        3.0 (L)             07/22/2023                CO2                      18 (L)              07/22/2023                GLUCOSE                  169 (H)             07/22/2023                BUN                      <5 (L)              07/22/2023                CREATININE               0.70                07/22/2023                CALCIUM                  8.1 (L)             07/22/2023                GFRNONAA                 >60                 07/22/2023                Musculoskeletal  Abdominal   Peds  Hematology  (+) Blood dyscrasia, anemia .Lab Results      Component                Value               Date                      WBC                      7.4                 07/22/2023                HGB                      10.4 (L)            07/22/2023                HCT                      31.8 (L)            07/22/2023                MCV                      83.5                07/22/2023                PLT                       293                 07/22/2023            Lovenox bid last dose 12/28 2204   Anesthesia Other Findings   Reproductive/Obstetrics (+) Pregnancy                              Anesthesia Physical Anesthesia Plan  ASA: 3  Anesthesia Plan: Epidural   Post-op Pain Management:    Induction:   PONV Risk Score and Plan: 2 and Treatment may vary due to age or medical condition  Airway Management Planned: Natural Airway  Additional Equipment: None  Intra-op Plan:   Post-operative Plan:   Informed Consent: I have reviewed the patients History and Physical, chart, labs and discussed the procedure including the risks, benefits and alternatives for the proposed anesthesia with the patient or authorized representative who has indicated his/her understanding and acceptance.       Plan Discussed with: Anesthesiologist  Anesthesia Plan Comments:          Anesthesia Quick Evaluation

## 2023-07-22 NOTE — Progress Notes (Signed)
Patient ID: Hayley Webb, female   DOB: [birth year], 37 y.o.   MRN: [id number] Pt had chest pain.  No SOB BP (!) 144/81   Pulse 76   Temp 97.8 F (36.6 C) (Oral)   Resp 16   Ht 5' 8.5" (1.74 m)   Wt (!) 145.5 kg   LMP 11/18/2022   SpO2 97%   BMI 48.07 kg/m  Cat 1 Toco q 2-5 min Pt with normal exam Restart magnesium Adequate urine output Anticipate SVD

## 2023-07-22 NOTE — Significant Event (Addendum)
Rapid Response Event Note   Reason for Call : Shock Initial Focused Assessment:  I was notified by nursing staff of pt with BP 72/32.  37 y.o. [id number] [redacted]w[redacted]d with chronic hypertension/pre-eclampsia now hypotensive, lethargic and clammy. Magnesium infusion temporarily paused until MD arrives. LR bolus 1 liter initiated. Pt is arousable to noxious stimuli following ambien 5mg  PO and fentanyl IV recently. CBG 124. BP dropped to 62/45 with HR 78. Glucagon 1mg  IV ordered and given for possible beta blocker reversal.  Pt denies CP and SOB. Dr. Normand Sloop to bedside. BP improved to 113/74, 135/83 with volume. Pt more alert.   0039-HR 85, 116/74, RR 15 with sats 99% on 2L [place].   Interventions:  -LR bolus -Glucagon 1mg  IV   MD Notified: Dr. Normand Sloop at bedside.  Call Time: 2356 Arrival Time: 0002 End Time: 0040  Rose Fillers, RN

## 2023-07-23 ENCOUNTER — Encounter (HOSPITAL_COMMUNITY): Payer: Self-pay | Admitting: Obstetrics and Gynecology

## 2023-07-23 LAB — CBC
HCT: 30.4 % — ABNORMAL LOW (ref 36.0–46.0)
Hemoglobin: 10 g/dL — ABNORMAL LOW (ref 12.0–15.0)
MCH: 27.2 pg (ref 26.0–34.0)
MCHC: 32.9 g/dL (ref 30.0–36.0)
MCV: 82.8 fL (ref 80.0–100.0)
Platelets: 276 10*3/uL (ref 150–400)
RBC: 3.67 MIL/uL — ABNORMAL LOW (ref 3.87–5.11)
RDW: 15.6 % — ABNORMAL HIGH (ref 11.5–15.5)
WBC: 5.5 10*3/uL (ref 4.0–10.5)
nRBC: 0 % (ref 0.0–0.2)

## 2023-07-23 LAB — COMPREHENSIVE METABOLIC PANEL
ALT: 14 U/L (ref 0–44)
AST: 19 U/L (ref 15–41)
Albumin: 2.4 g/dL — ABNORMAL LOW (ref 3.5–5.0)
Alkaline Phosphatase: 113 U/L (ref 38–126)
Anion gap: 11 (ref 5–15)
BUN: <5 mg/dL — ABNORMAL LOW (ref 6–20)
CO2: 18 mmol/L — ABNORMAL LOW (ref 22–32)
Calcium: 7.9 mg/dL — ABNORMAL LOW (ref 8.9–10.3)
Chloride: 104 mmol/L (ref 98–111)
Creatinine, Ser: 0.61 mg/dL (ref 0.44–1.00)
GFR, Estimated: >60 mL/min (ref >60)
Glucose, Bld: 87 mg/dL (ref 70–99)
Potassium: 3.3 mmol/L — ABNORMAL LOW (ref 3.5–5.1)
Sodium: 133 mmol/L — ABNORMAL LOW (ref 135–145)
Total Bilirubin: 0.6 mg/dL (ref 0.0–1.2)
Total Protein: 6 g/dL — ABNORMAL LOW (ref 6.5–8.1)

## 2023-07-23 LAB — MAGNESIUM: Magnesium: 4.2 mg/dL — ABNORMAL HIGH (ref 1.7–2.4)

## 2023-07-23 LAB — CULTURE, BETA STREP (GROUP B ONLY)

## 2023-07-23 LAB — TYPE AND SCREEN
ABO/RH(D): O POS
Antibody Screen: NEGATIVE

## 2023-07-23 LAB — GLUCOSE, CAPILLARY: Glucose-Capillary: 129 mg/dL — ABNORMAL HIGH (ref 70–99)

## 2023-07-23 MED ORDER — LABETALOL HCL 200 MG PO TABS
600.0000 mg | ORAL_TABLET | Freq: Three times a day (TID) | ORAL | Status: DC
  Administered 2023-07-23 – 2023-07-25 (×5): 600 mg via ORAL
  Filled 2023-07-23 (×5): qty 3

## 2023-07-23 MED ORDER — LABETALOL HCL 5 MG/ML IV SOLN: 40.0000 mg | INTRAVENOUS | Status: DC | PRN

## 2023-07-23 MED ORDER — ENOXAPARIN SODIUM 150 MG/ML IJ SOSY
150.0000 mg | PREFILLED_SYRINGE | Freq: Two times a day (BID) | INTRAMUSCULAR | Status: DC
  Administered 2023-07-24 – 2023-07-28 (×9): 150 mg via SUBCUTANEOUS
  Filled 2023-07-23 (×10): qty 1

## 2023-07-23 MED ORDER — LABETALOL HCL 5 MG/ML IV SOLN
20.0000 mg | INTRAVENOUS | Status: DC | PRN
  Administered 2023-07-23: 20 mg via INTRAVENOUS
  Filled 2023-07-23: qty 4

## 2023-07-23 MED ORDER — LABETALOL HCL 5 MG/ML IV SOLN: 80.0000 mg | INTRAVENOUS | Status: DC | PRN

## 2023-07-23 MED ORDER — HYDRALAZINE HCL 20 MG/ML IJ SOLN: 10.0000 mg | INTRAMUSCULAR | Status: DC | PRN

## 2023-07-23 MED ORDER — WITCH HAZEL-GLYCERIN EX PADS: 1.0000 | MEDICATED_PAD | CUTANEOUS | Status: DC | PRN

## 2023-07-23 MED ORDER — SIMETHICONE 80 MG PO CHEW: 80.0000 mg | CHEWABLE_TABLET | ORAL | Status: DC | PRN

## 2023-07-23 MED ORDER — LACTATED RINGERS IV SOLN: INTRAVENOUS | Status: AC

## 2023-07-23 MED ORDER — ACETAMINOPHEN 325 MG PO TABS: 650.0000 mg | ORAL_TABLET | ORAL | Status: DC | PRN

## 2023-07-23 MED ORDER — FENTANYL CITRATE (PF) 100 MCG/2ML IJ SOLN
INTRAMUSCULAR | Status: AC
  Filled 2023-07-23: qty 2

## 2023-07-23 MED ORDER — SENNOSIDES-DOCUSATE SODIUM 8.6-50 MG PO TABS
2.0000 | ORAL_TABLET | Freq: Every day | ORAL | Status: DC
  Administered 2023-07-24 – 2023-07-28 (×3): 2 via ORAL
  Filled 2023-07-23 (×5): qty 2

## 2023-07-23 MED ORDER — ZOLPIDEM TARTRATE 5 MG PO TABS: 5.0000 mg | ORAL_TABLET | Freq: Every evening | ORAL | Status: DC | PRN

## 2023-07-23 MED ORDER — PRENATAL MULTIVITAMIN CH
1.0000 | ORAL_TABLET | Freq: Every day | ORAL | Status: DC
  Administered 2023-07-24 – 2023-07-28 (×5): 1 via ORAL
  Filled 2023-07-23 (×5): qty 1

## 2023-07-23 MED ORDER — BENZOCAINE-MENTHOL 20-0.5 % EX AERO
1.0000 | INHALATION_SPRAY | CUTANEOUS | Status: DC | PRN
  Administered 2023-07-23: 1 via TOPICAL
  Filled 2023-07-23: qty 56

## 2023-07-23 MED ORDER — DIBUCAINE (PERIANAL) 1 % EX OINT: 1.0000 | TOPICAL_OINTMENT | CUTANEOUS | Status: DC | PRN

## 2023-07-23 MED ORDER — COCONUT OIL OIL
1.0000 | TOPICAL_OIL | Status: DC | PRN
  Administered 2023-07-23 – 2023-07-25 (×2): 1 via TOPICAL

## 2023-07-23 MED ORDER — FENTANYL CITRATE (PF) 100 MCG/2ML IJ SOLN
INTRAMUSCULAR | Status: DC | PRN
  Administered 2023-07-23: 100 ug via EPIDURAL

## 2023-07-23 MED ORDER — IBUPROFEN 600 MG PO TABS
600.0000 mg | ORAL_TABLET | Freq: Four times a day (QID) | ORAL | Status: DC
  Administered 2023-07-23 – 2023-07-28 (×20): 600 mg via ORAL
  Filled 2023-07-23 (×21): qty 1

## 2023-07-23 MED ORDER — DIPHENHYDRAMINE HCL 25 MG PO CAPS: 25.0000 mg | ORAL_CAPSULE | Freq: Four times a day (QID) | ORAL | Status: DC | PRN

## 2023-07-23 NOTE — Lactation Note (Signed)
This note was copied from a baby's chart.  NICU Lactation Consultation Note  Patient Name: Hayley Webb [id number] Date: 07/23/2023 Age:35 hours  Reason for consult: Initial assessment; NICU baby; Preterm <34wks; Infant < 6lbs; Other (Comment) (GHTN on MgSO4)  SUBJECTIVE  LC in to visit with P4 Mom of [redacted]w[redacted]d baby "Hayley Webb" in the NICU currently on CPAP.  Mom induced for severe pre-eclampsia.  Mom is currently on magnesium sulfate.   LC assisted Mom to initiate pumping at 3 hrs post delivery.  Reviewed breast massage and hand expression.  18 mm flanges fitted.  Mom instructed to use the initiation setting.  Colostrum protectors in place and Mom explained how they work and how to discard after 24 hrs.  Mom does have a pump from her previous baby from last year, she needs some parts.  Offered to submit a STORK request and Mom agreeable.  Mom states she does not have WIC.  OBJECTIVE Infant data: Mother's Current Feeding Choice: Breast Milk and Donor Milk  O2 Device: CPAP FiO2 (%): 21 %  Infant feeding assessment No data recorded  Maternal data: [id number] Vaginal, Spontaneous Has patient been taught Hand Expression?: Yes Hand Expression Comments: no colostrum noted yet Significant Breast History:: ++ breast changes Current breast feeding challenges:: infant separation Does the patient have breastfeeding experience prior to this delivery?: Yes How long did the patient breastfeed?: 3 months with all 3 babies Pumping frequency: Initiated double pumping at 3 hrs post delivery Flange Size: 18 Risk factor for low/delayed milk supply:: Infant separation  WIC Program: No WIC Referral Sent?: No Pump:  (submitted a STORK pump request)  ASSESSMENT Infant:  Feeding Status: Scheduled 8-11-2-5  Maternal: ++ breast changes INTERVENTIONS/PLAN Interventions: Interventions: Breast feeding basics reviewed; Skin to skin; Breast massage; Hand express; Coconut oil; Support pillows; DEBP; CDC  Guidelines for Breast Pump Cleaning; LC Services brochure Tools: Flanges; Pump; Coconut oil Pump Education: Setup, frequency, and cleaning; Milk Storage  Plan: 1- STS with baby in the NICU when able to 2- Breast massage and hand expression 3- Pump both breasts on initiation setting every 3 hrs or 8 times per 24 hrs 4- ask for lactation prn  Consult Status: NICU follow-up NICU Follow-up type: New admission follow up   Hayley Webb 07/23/2023, 4:57 PM

## 2023-07-24 LAB — CBC
HCT: 25.5 % — ABNORMAL LOW (ref 36.0–46.0)
Hemoglobin: 8.3 g/dL — ABNORMAL LOW (ref 12.0–15.0)
MCH: 27 pg (ref 26.0–34.0)
MCHC: 32.5 g/dL (ref 30.0–36.0)
MCV: 83.1 fL (ref 80.0–100.0)
Platelets: 240 10*3/uL (ref 150–400)
RBC: 3.07 MIL/uL — ABNORMAL LOW (ref 3.87–5.11)
RDW: 15.7 % — ABNORMAL HIGH (ref 11.5–15.5)
WBC: 5.5 10*3/uL (ref 4.0–10.5)
nRBC: 0 % (ref 0.0–0.2)

## 2023-07-24 LAB — COMPREHENSIVE METABOLIC PANEL
ALT: 13 U/L (ref 0–44)
AST: 18 U/L (ref 15–41)
Albumin: 2.1 g/dL — ABNORMAL LOW (ref 3.5–5.0)
Alkaline Phosphatase: 102 U/L (ref 38–126)
Anion gap: 9 (ref 5–15)
BUN: 5 mg/dL — ABNORMAL LOW (ref 6–20)
CO2: 21 mmol/L — ABNORMAL LOW (ref 22–32)
Calcium: 7.7 mg/dL — ABNORMAL LOW (ref 8.9–10.3)
Chloride: 104 mmol/L (ref 98–111)
Creatinine, Ser: 0.63 mg/dL (ref 0.44–1.00)
GFR, Estimated: >60 mL/min (ref >60)
Glucose, Bld: 95 mg/dL (ref 70–99)
Potassium: 3.2 mmol/L — ABNORMAL LOW (ref 3.5–5.1)
Sodium: 134 mmol/L — ABNORMAL LOW (ref 135–145)
Total Bilirubin: <0.2 mg/dL (ref 0.0–1.2)
Total Protein: 5.1 g/dL — ABNORMAL LOW (ref 6.5–8.1)

## 2023-07-24 LAB — GLUCOSE, CAPILLARY
Glucose-Capillary: 120 mg/dL — ABNORMAL HIGH (ref 70–99)
Glucose-Capillary: 97 mg/dL (ref 70–99)

## 2023-07-24 LAB — MAGNESIUM: Magnesium: 4.1 mg/dL — ABNORMAL HIGH (ref 1.7–2.4)

## 2023-07-24 NOTE — Anesthesia Postprocedure Evaluation (Signed)
 Anesthesia Post Note  Patient: Hayley Webb  Procedure(s) Performed: AN AD HOC LABOR EPIDURAL     Patient location during evaluation: Mother Baby Anesthesia Type: Epidural Level of consciousness: awake and alert and oriented Pain management: satisfactory to patient Vital Signs Assessment: post-procedure vital signs reviewed and stable Respiratory status: respiratory function stable Cardiovascular status: stable Postop Assessment: no headache, no backache, epidural receding, patient able to bend at knees, no signs of nausea or vomiting, adequate PO intake and able to ambulate Anesthetic complications: no   No notable events documented.  Last Vitals:  Vitals:   07/24/23 0600 07/24/23 0837  BP:  (!) 143/84  Pulse:  81  Resp: 18 18  Temp:  36.6 C  SpO2:  99%    Last Pain:  Vitals:   07/24/23 0837  TempSrc:   PainSc: 3    Pain Goal: Patients Stated Pain Goal: 3 (07/24/23 9162)              Epidural/Spinal Function Cutaneous sensation: Normal sensation (07/24/23 0837)  PURNELL PORTAL

## 2023-07-24 NOTE — Social Work (Addendum)
 MOB was referred for NICU admission with noted depression/anxiety.   Patient screened out for NICU psychosocial assessment since none of the following apply: Psychosocial stressors documented in mother or baby's chart Gestation less than 32 weeks Code at delivery  Infant with anomalies  * Referral screened out by Clinical Social Worker because none of the following criteria appear to apply: ~ History of anxiety/depression during this pregnancy, or of post-partum depression following prior delivery. ~ Diagnosis of anxiety and/or depression within last 3 years. Per chart review, MOB diagnosis dates back to 2010.There were no concerns noted in the OB record.  OR * MOB's symptoms currently being treated with medication and/or therapy.  Please contact the Clinical Social Worker if needs arise, by [facility] request, or if MOB scores greater than 9/yes to question 10 on Edinburgh Postpartum Depression Screen. MOB scored 2 on the Edinburgh.   Nat Quiet, MSW, LCSW Clinical Social Worker  [phone] 07/24/2023  1:15 PM

## 2023-07-24 NOTE — Progress Notes (Signed)
 Post Partum Day 1  Subjective: no complaints, up ad lib, voiding, and tolerating PO.  Denies HA, visual changes or abdominal pain.  Objective: Blood pressure (!) 143/84, pulse 81, temperature 97.8 F (36.6 C), temperature source Oral, resp. rate 19, height 5' 8.5 (1.74 m), weight (!) 145.5 kg, last menstrual period 11/18/2022, SpO2 99%, unknown if currently breastfeeding.  Physical Exam:  General: alert, cooperative, and no distress Lochia: appropriate Uterine Fundus: firm Incision: n/a DVT Evaluation: No evidence of DVT seen on physical exam.  Recent Labs    07/23/23 1352 07/24/23 0432  HGB 10.0* 8.3*  HCT 30.4* 25.5*    Assessment/Plan: BPs mostly controlled on labetalol  600mg  TID.  Last BP mildly elevated.  Still on Mg until 1330 today. Cont routine PP care Cont lovenox  150mg  BID for h/o PE Pt instructed to f/u with hematologist, Dr. Valley (sp?), after discharge   LOS: 4 days   Jon CINDERELLA Rummer, MD 07/24/2023, 12:15 PM

## 2023-07-25 LAB — GLUCOSE, CAPILLARY
Glucose-Capillary: 81 mg/dL (ref 70–99)
Glucose-Capillary: 85 mg/dL (ref 70–99)
Glucose-Capillary: 94 mg/dL (ref 70–99)

## 2023-07-25 MED ORDER — LABETALOL HCL 200 MG PO TABS
600.0000 mg | ORAL_TABLET | Freq: Four times a day (QID) | ORAL | Status: DC
  Administered 2023-07-25 – 2023-07-27 (×10): 600 mg via ORAL
  Filled 2023-07-25 (×11): qty 3

## 2023-07-25 NOTE — Progress Notes (Signed)
 POSTPARTUM PROGRESS NOTE  Post Partum Day 2  Subjective:  Hayley Webb is a 38 y.o. [id number] s/p VD at [redacted]w[redacted]d.  She reports she is doing well. No acute events overnight. She denies any problems with ambulating, voiding or po intake. Denies nausea or vomiting.  Pain is well controlled.  Lochia is minimal.  Objective: Blood pressure (!) 150/89, pulse 79, temperature 98.6 F (37 C), temperature source Oral, resp. rate 18, height 5' 8.5 (1.74 m), weight (!) 145.5 kg, last menstrual period 11/18/2022, SpO2 97%, unknown if currently breastfeeding.  Physical Exam:  General: alert, cooperative and no distress Chest: no respiratory distress Heart:regular rate, distal pulses intact Abdomen: soft, nontender,  Uterine Fundus: firm, appropriately tender DVT Evaluation: No calf swelling or tenderness Extremities: Mild non-pitting edema Skin: warm, dry  Recent Labs    07/23/23 1352 07/24/23 0432  HGB 10.0* 8.3*  HCT 30.4* 25.5*    Assessment/Plan: Hayley Webb is a 38 y.o. [id number] s/p VD at [redacted]w[redacted]d   PPD#2 - Doing well  Routine postpartum care cHTN w/ SIPE- s/p 24 hr mag BP remains elevated. Start labetalol  600 mg QID. Continue to monitor BP.   Dispo: Plan for discharge 1/2.   LOS: 5 days   Satya Bohall Autry-Lott, DO 07/25/2023, 7:52 AM

## 2023-07-25 NOTE — Lactation Note (Signed)
 This note was copied from a baby's chart. Lactation Consultation Note  Patient Name: Boy Adalena Abdulla Unijb'd Date: 07/25/2023 Age:36 hours   LC in the room to visit with family of 13 37/4 weeks old AGA NICU female but Ms. Hutmacher had stepped out and wasn't present at a time. Noticed that she has received her Spectra  S2 Stork pump; maternal discharge planning underway for tomorrow. Lactation services to F/U at a later time.   Kimothy Kishimoto S Yovan Leeman 07/25/2023, 3:15 PM

## 2023-07-26 ENCOUNTER — Ambulatory Visit: Payer: Medicaid Other

## 2023-07-26 LAB — SURGICAL PATHOLOGY

## 2023-07-26 LAB — GLUCOSE, CAPILLARY: Glucose-Capillary: 77 mg/dL (ref 70–99)

## 2023-07-26 MED ORDER — ENALAPRIL MALEATE 2.5 MG PO TABS
10.0000 mg | ORAL_TABLET | Freq: Every day | ORAL | Status: DC
  Administered 2023-07-27 – 2023-07-28 (×2): 10 mg via ORAL
  Filled 2023-07-26 (×2): qty 4

## 2023-07-26 MED ORDER — LISINOPRIL 10 MG PO TABS
20.0000 mg | ORAL_TABLET | Freq: Every day | ORAL | Status: DC
  Administered 2023-07-26: 20 mg via ORAL
  Filled 2023-07-26: qty 2

## 2023-07-26 NOTE — Progress Notes (Signed)
 Post Partum Day 3 Subjective: no complaints, up ad lib, voiding, and tolerating PO  Objective: Blood pressure (!) 141/85, pulse 80, temperature 97.8 F (36.6 C), temperature source Oral, resp. rate 19, height 5' 8.5 (1.74 m), weight (!) 145.5 kg, last menstrual period 11/18/2022, SpO2 100%, unknown if currently breastfeeding.    07/26/2023   10:46 AM 07/26/2023    9:57 AM 07/26/2023    6:10 AM  Vitals with BMI  Systolic 141 149 866  Diastolic 85 87 81  Pulse 80 73 82    Physical Exam:  General: alert, cooperative, and no distress Lochia: appropriate Uterine Fundus: firm Incision: N/A DVT Evaluation: No evidence of DVT seen on physical exam. Calf/Ankle edema is present.  Recent Labs    07/23/23 1352 07/24/23 0432  HGB 10.0* 8.3*  HCT 30.4* 25.5*   CBG (last 3)  Recent Labs    07/25/23 1322 07/25/23 2121 07/26/23 0614  GLUCAP 85 94 77     Assessment/Plan: 37 y/o [id number] PPD # 3, s/p IOL for chronic HTN with superimposed preeclampsia with severe features, s/p 24 hrs of pp Magnesium  sulfate, stable, Labetalol  600 mg PO QID for blood pressure control. If blood pressures reman elevated will add a second antihypertensive.  Circumcision prior to discharge and Contraception for interval BTL.  Iron tabs for anemia.    LOS: 6 days   Hayley LELON Foil, MD 07/26/2023, 11:15 AM

## 2023-07-26 NOTE — Lactation Note (Signed)
 This note was copied from a baby's chart.  NICU Lactation Consultation Note  Patient Name: Hayley Webb Date: 07/26/2023 Age:36 hours  Reason for consult: Follow-up assessment; Maternal discharge; NICU baby; Infant < 6lbs; Late-preterm 34-36.6wks; Other (Comment) (gHTN, AMA)  SUBJECTIVE Visited with family of 54 41/106 weeks old AGA NICU female; Hayley Webb is a P4 and reported that despite pumping consistently every 3 hours, she hasn't seen any volume yet. Re-educated about supply/demand and the onset of secretory activation, let her know that her body just needs time for her milk to come in. She had questions regarding galactagogues, discussed those as well per her request. She might be getting discharged from [facility] today. Reviewed discharge education, pumping schedule, pumping log, pump settings and the importance of consistent pumping for the onset of lactogenesis II and to protect her supply. She got her Stork pump and plans to get inserts since the Spectra  flanges do not come in # 18 flanges; she might still have the inserts she used with her 38 y.o  OBJECTIVE Infant data: Mother's Current Feeding Choice: Breast Milk and Donor Milk  O2 Device: Room Air  Infant feeding assessment IDFTS - Readiness: 3 (unsustained cues and wake states out of bed) IDFTS - Quality: 4   Maternal data: [id number] Vaginal, Spontaneous Current breast feeding challenges:: NICU admission Pumping frequency: at least 8 times/24 hours Pumped volume: 0 mL (droplets) Flange Size: 18 Risk factor for low/delayed milk supply:: prematurity, < 5 lbs, infant separation  WIC Program: No WIC Referral Sent?: No Pump: Stork Pump (Spectra  S2)  ASSESSMENT Infant: Feeding Status: Scheduled 8-11-2-5 Feeding method: Tube/Gavage (Bolus) Nipple Type: Nfant Extra Slow Flow (gold)  Maternal: Milk volume: Normal  INTERVENTIONS/PLAN Interventions: Interventions: Breast feeding basics reviewed; Coconut oil; DEBP;  Education Discharge Education: Engorgement and breast care Tools: Pump; Flanges; Coconut oil Pump Education: Setup, frequency, and cleaning; Milk Storage  Plan: Encouraged pumping every 3 hours, ideally 8 pumping sessions/24 hours She'll take all pump parts to baby's room after her discharge She'll change her pump settings from initiate to maintain mode once she starts getting 20 ml of EBM combined or by day 5, whatever happens first  No other support person at this time. All questions and concerns answered, family to contact [facility] services PRN.  Consult Status: NICU follow-up NICU Follow-up type: Verify absence of engorgement; Verify onset of copious milk   Caitlyn Buchanan S Allye Hoyos 07/26/2023, 1:05 PM

## 2023-07-27 LAB — GLUCOSE, CAPILLARY
Glucose-Capillary: 93 mg/dL (ref 70–99)
Glucose-Capillary: 95 mg/dL (ref 70–99)

## 2023-07-27 MED ORDER — LABETALOL HCL 200 MG PO TABS
600.0000 mg | ORAL_TABLET | Freq: Four times a day (QID) | ORAL | Status: DC
  Administered 2023-07-27 – 2023-07-28 (×4): 600 mg via ORAL
  Filled 2023-07-27 (×4): qty 3

## 2023-07-27 NOTE — Progress Notes (Signed)
 Pt in NICU will see pt there

## 2023-07-28 ENCOUNTER — Ambulatory Visit (HOSPITAL_COMMUNITY): Payer: Self-pay

## 2023-07-28 ENCOUNTER — Other Ambulatory Visit (HOSPITAL_COMMUNITY): Payer: Self-pay

## 2023-07-28 LAB — GLUCOSE, CAPILLARY: Glucose-Capillary: 86 mg/dL (ref 70–99)

## 2023-07-28 MED ORDER — PRENATAL MULTIVITAMIN CH
1.0000 | ORAL_TABLET | Freq: Every day | ORAL | 0 refills | Status: AC
  Filled 2023-07-28: qty 30, 30d supply, fill #0

## 2023-07-28 MED ORDER — IBUPROFEN 600 MG PO TABS
600.0000 mg | ORAL_TABLET | Freq: Four times a day (QID) | ORAL | 0 refills | Status: DC
  Filled 2023-07-28: qty 30, 8d supply, fill #0

## 2023-07-28 MED ORDER — ENOXAPARIN SODIUM 150 MG/ML IJ SOSY
150.0000 mg | PREFILLED_SYRINGE | Freq: Two times a day (BID) | INTRAMUSCULAR | 0 refills | Status: AC
  Filled 2023-07-28: qty 17, 8d supply, fill #0
  Filled 2023-07-28: qty 43, 22d supply, fill #0

## 2023-07-28 MED ORDER — ACETAMINOPHEN 325 MG PO TABS
650.0000 mg | ORAL_TABLET | ORAL | 1 refills | Status: DC | PRN
  Filled 2023-07-28: qty 30, 3d supply, fill #0

## 2023-07-28 MED ORDER — BENZOCAINE-MENTHOL 20-0.5 % EX AERO
1.0000 | INHALATION_SPRAY | CUTANEOUS | 1 refills | Status: DC | PRN
  Filled 2023-07-28: qty 85, 30d supply, fill #0

## 2023-07-28 MED ORDER — ENALAPRIL MALEATE 10 MG PO TABS
10.0000 mg | ORAL_TABLET | Freq: Every day | ORAL | 0 refills | Status: AC
  Filled 2023-07-28: qty 30, 30d supply, fill #0

## 2023-07-28 MED ORDER — LABETALOL HCL 200 MG PO TABS
600.0000 mg | ORAL_TABLET | Freq: Four times a day (QID) | ORAL | 0 refills | Status: AC
  Filled 2023-07-28: qty 218, 18d supply, fill #0

## 2023-07-28 NOTE — Lactation Note (Signed)
 This note was copied from a baby's chart.  NICU Lactation Consultation Note  Patient Name: Hayley Webb Date: 07/28/2023 Age:36 days  Reason for consult: Follow-up assessment; Maternal discharge; NICU baby; Infant < 6lbs; Late-preterm 34-36.6wks; Other (Comment) (gHTN, AMA)  SUBJECTIVE Visited with family of 53 67/71 weeks old AGA NICU female; [name] is a P4 and reports that she's been pumping, her milk is in, praised her for her efforts. She got discharged from [facility] today and already brought all her pump pieces to [name]'s room. Revised discharge education and the importance of consistent pumping for the prevention of engorgement and the protect her supply, she voiced understanding. She already ordered inserts for her Spectra  pump at home, but plans to primarily pump at the bedside in baby's room.  OBJECTIVE Infant data: Mother's Current Feeding Choice: Breast Milk and Donor Milk  O2 Device: Room Air  Infant feeding assessment IDFTS - Readiness: 2 IDFTS - Quality: 4   Maternal data: [id number] Vaginal, Spontaneous Pumping frequency: 4-5 times/24 hours Pumped volume: 35 mL Flange Size: 18  WIC Program: No WIC Referral Sent?: No Pump: Stork Pump (Spectra  S2)  ASSESSMENT Infant: Feeding Status: (S) Scheduled 9-12-3-6 (changed schedule d/t MOB preference d/t infant waking up around 9-schedule times) Feeding method: Bottle; Tube/Gavage (Bolus) Nipple Type: Dr. Jonna Fling Preemie  Maternal: Milk volume: Normal  INTERVENTIONS/PLAN Interventions: Interventions: Breast feeding basics reviewed; Coconut oil; DEBP; Education Tools: Pump; Flanges; Coconut oil Pump Education: Setup, frequency, and cleaning  Plan: Encouraged pumping on maintain mode every 3 hours, ideally 8 pumping sessions/24 hours She'll call out for assistance when she's ready to take [name] to breast    No other support person at this time. All questions and concerns answered, family  to contact [facility] services PRN.  Consult Status: NICU follow-up NICU Follow-up type: Verify absence of engorgement; Weekly NICU follow up   Hayley Webb 07/28/2023, 5:12 PM

## 2023-07-28 NOTE — Plan of Care (Signed)
 Problem: Education: Goal: Knowledge of disease or condition will improve Outcome: Completed/Met Goal: Knowledge of the prescribed therapeutic regimen will improve Outcome: Completed/Met   Problem: Fluid Volume: Goal: Peripheral tissue perfusion will improve Outcome: Completed/Met   Problem: Clinical Measurements: Goal: Complications related to disease process, condition or treatment will be avoided or minimized Outcome: Completed/Met   Problem: Education: Goal: Knowledge of disease or condition will improve Outcome: Completed/Met Goal: Knowledge of the prescribed therapeutic regimen will improve Outcome: Completed/Met Goal: Individualized Educational Video(s) Outcome: Completed/Met   Problem: Clinical Measurements: Goal: Complications related to the disease process, condition or treatment will be avoided or minimized Outcome: Completed/Met   Problem: Education: Goal: Knowledge of General Education information will improve Description: Including pain rating scale, medication(s)/side effects and non-pharmacologic comfort measures Outcome: Completed/Met   Problem: Health Behavior/Discharge Planning: Goal: Ability to manage health-related needs will improve Outcome: Completed/Met   Problem: Clinical Measurements: Goal: Ability to maintain clinical measurements within normal limits will improve Outcome: Completed/Met Goal: Will remain free from infection Outcome: Completed/Met Goal: Diagnostic test results will improve Outcome: Completed/Met Goal: Respiratory complications will improve Outcome: Completed/Met Goal: Cardiovascular complication will be avoided Outcome: Completed/Met   Problem: Activity: Goal: Risk for activity intolerance will decrease Outcome: Completed/Met   Problem: Nutrition: Goal: Adequate nutrition will be maintained Outcome: Completed/Met   Problem: Coping: Goal: Level of anxiety will decrease Outcome: Completed/Met   Problem:  Elimination: Goal: Will not experience complications related to bowel motility Outcome: Completed/Met Goal: Will not experience complications related to urinary retention Outcome: Completed/Met   Problem: Pain Management: Goal: General experience of comfort will improve Outcome: Completed/Met   Problem: Safety: Goal: Ability to remain free from injury will improve Outcome: Completed/Met   Problem: Skin Integrity: Goal: Risk for impaired skin integrity will decrease Outcome: Completed/Met   Problem: Education: Goal: Knowledge of General Education information will improve Description: Including pain rating scale, medication(s)/side effects and non-pharmacologic comfort measures Outcome: Completed/Met   Problem: Health Behavior/Discharge Planning: Goal: Ability to manage health-related needs will improve Outcome: Completed/Met   Problem: Clinical Measurements: Goal: Ability to maintain clinical measurements within normal limits will improve Outcome: Completed/Met Goal: Will remain free from infection Outcome: Completed/Met Goal: Diagnostic test results will improve Outcome: Completed/Met Goal: Respiratory complications will improve Outcome: Completed/Met Goal: Cardiovascular complication will be avoided Outcome: Completed/Met   Problem: Activity: Goal: Risk for activity intolerance will decrease Outcome: Completed/Met   Problem: Nutrition: Goal: Adequate nutrition will be maintained Outcome: Completed/Met   Problem: Coping: Goal: Level of anxiety will decrease Outcome: Completed/Met   Problem: Elimination: Goal: Will not experience complications related to bowel motility Outcome: Completed/Met Goal: Will not experience complications related to urinary retention Outcome: Completed/Met   Problem: Pain Management: Goal: General experience of comfort will improve Outcome: Completed/Met   Problem: Safety: Goal: Ability to remain free from injury will  improve Outcome: Completed/Met   Problem: Skin Integrity: Goal: Risk for impaired skin integrity will decrease Outcome: Completed/Met   Problem: Education: Goal: Ability to describe self-care measures that may prevent or decrease complications (Diabetes Survival Skills Education) will improve Outcome: Completed/Met Goal: Individualized Educational Video(s) Outcome: Completed/Met   Problem: Coping: Goal: Ability to adjust to condition or change in health will improve Outcome: Completed/Met   Problem: Fluid Volume: Goal: Ability to maintain a balanced intake and output will improve Outcome: Completed/Met   Problem: Health Behavior/Discharge Planning: Goal: Ability to identify and utilize available resources and services will improve Outcome: Completed/Met Goal: Ability to manage health-related needs will improve  Outcome: Completed/Met   Problem: Metabolic: Goal: Ability to maintain appropriate glucose levels will improve Outcome: Completed/Met

## 2023-07-28 NOTE — Discharge Summary (Signed)
 Postpartum Discharge Summary  Date of Service updated1/4/25     Patient Name: Hayley Webb DOB: 17-Dec-1985 MRN: [id number]  Date of admission: 07/20/2023 Delivery date:07/23/2023 Delivering provider: GLORIANN CHICK Date of discharge: 07/28/2023  Admitting diagnosis: Gestational hypertension w/o significant proteinuria in 3rd trimester [O13.3] Intrauterine pregnancy: [redacted]w[redacted]d     Secondary diagnosis:  Active Problems:   Gestational hypertension w/o significant proteinuria in 3rd trimester  Additional problems: severe preeclampsia H/o PE lupus    Discharge diagnosis: Preterm Pregnancy Delivered, Preeclampsia (severe), CHTN, GDM A1, and Anemia                                              Post partum procedures:   Augmentation: AROM, Pitocin , and Cytotec  Complications: None  Hospital course: Induction of Labor With Vaginal Delivery   38 y.o. yo [id number] at [redacted]w[redacted]d was admitted to the hospital 07/20/2023 for induction of labor.  Indication for induction: Preeclampsia, A1 DM, and h/o PE .  Patient had an labor course complicated by blood pressures in the severe range  Membrane Rupture Time/Date: 9:00 AM,07/23/2023  Delivery Method:Vaginal, Spontaneous Operative Delivery:N/A Episiotomy: None Lacerations:  Labial Details of delivery can be found in separate delivery note.  Patient had a postpartum course complicated byelevated blood pressures.  . Patient is discharged home 07/28/23.  Newborn Data: Birth date:07/23/2023 Birth time:1:11 PM Gender:Female Living status:Living Apgars:7 ,8  Weight:2330 g  Magnesium  Sulfate received: Yes: Seizure prophylaxis BMZ received: Yes Rhophylac:No MMR:No T-DaP:   Flu: No RSV Vaccine received: No Transfusion:No Immunizations administered: Immunization History  Administered Date(s) Administered   HPV Quadrivalent 03/29/2011   Influenza,inj,Quad PF,6+ Mos 05/03/2020   Tdap 10/26/2013    Physical exam  Vitals:   07/27/23 2045 07/28/23 0247  07/28/23 0700 07/28/23 1246  BP:  (!) 142/95 (!) 149/89 (!) 148/93  Pulse: 79 78 81 78  Resp: 18 17 16 19   Temp: 97.7 F (36.5 C) 97.8 F (36.6 C) 97.9 F (36.6 C) 98.3 F (36.8 C)  TempSrc: Oral Oral Oral Oral  SpO2: 99% 99% 99% 99%  Weight:      Height:       General: alert and cooperative Lochia: appropriate Uterine Fundus: firm Incision: N/A DVT Evaluation: Negative Homan's sign. Labs: Lab Results  Component Value Date   WBC 5.5 07/24/2023   HGB 8.3 (L) 07/24/2023   HCT 25.5 (L) 07/24/2023   MCV 83.1 07/24/2023   PLT 240 07/24/2023      Latest Ref Rng & Units 07/24/2023    4:32 AM  CMP  Glucose 70 - 99 mg/dL 95   BUN 6 - 20 mg/dL 5   Creatinine 9.55 - 8.99 mg/dL 9.36   Sodium 864 - 854 mmol/L 134   Potassium 3.5 - 5.1 mmol/L 3.2   Chloride 98 - 111 mmol/L 104   CO2 22 - 32 mmol/L 21   Calcium  8.9 - 10.3 mg/dL 7.7   Total Protein 6.5 - 8.1 g/dL 5.1   Total Bilirubin 0.0 - 1.2 mg/dL <9.7   Alkaline Phos 38 - 126 U/L 102   AST 15 - 41 U/L 18   ALT 0 - 44 U/L 13    Edinburgh Score:    07/24/2023    9:00 AM  Edinburgh Postnatal Depression Scale Screening Tool  I have been able to laugh and see the funny side of  things. 0  I have looked forward with enjoyment to things. 0  I have blamed myself unnecessarily when things went wrong. 1  I have been anxious or worried for no good reason. 1  I have felt scared or panicky for no good reason. 0  Things have been getting on top of me. 0  I have been so unhappy that I have had difficulty sleeping. 0  I have felt sad or miserable. 0  I have been so unhappy that I have been crying. 0  The thought of harming myself has occurred to me. 0  Edinburgh Postnatal Depression Scale Total 2      After visit meds:  Allergies as of 07/28/2023       Reactions   Bactrim Anaphylaxis, Hives   Hydrocodone  Anaphylaxis   Nifedipine  Swelling, Anaphylaxis   Swelling of tongue   Pineapple Itching   Prednisone Anaphylaxis, Hives    Has been on Dexamethasone  without issue.   Shrimp Extract Itching        Medication List     STOP taking these medications    cyclobenzaprine  10 MG tablet Commonly known as: FLEXERIL    ferrous sulfate  325 (65 FE) MG tablet   ondansetron  4 MG disintegrating tablet Commonly known as: ZOFRAN -ODT   oxyCODONE -acetaminophen  5-325 MG tablet Commonly known as: PERCOCET/ROXICET       TAKE these medications    acetaminophen  325 MG tablet Commonly known as: Tylenol  Take 2 tablets (650 mg total) by mouth every 4 (four) hours as needed (for pain scale < 4). What changed:  when to take this reasons to take this   benzocaine -Menthol  20-0.5 % Aero Commonly known as: DERMOPLAST Apply 1 Application topically as needed for irritation (perineal discomfort).   cholecalciferol  25 MCG (1000 UNIT) tablet Commonly known as: VITAMIN D3 Take 2,000 Units by mouth daily.   enalapril  10 MG tablet Commonly known as: VASOTEC  Take 1 tablet (10 mg total) by mouth daily. Start taking on: July 29, 2023   enoxaparin  150 MG/ML injection Commonly known as: LOVENOX  Inject 1 mL (150 mg total) into the skin every 12 (twelve) hours. What changed: when to take this   hydroxychloroquine  200 MG tablet Commonly known as: PLAQUENIL  Take by mouth daily.   ibuprofen  600 MG tablet Commonly known as: ADVIL  Take 1 tablet (600 mg total) by mouth every 6 (six) hours.   labetalol  200 MG tablet Commonly known as: NORMODYNE  Take 3 tablets (600 mg total) by mouth every 6 (six) hours. What changed:  medication strength how much to take when to take this   prenatal multivitamin Tabs tablet Take 1 tablet by mouth daily at 12 noon. Start taking on: July 29, 2023         Discharge home in stable condition Infant Feeding: Bottle and Breast Infant Disposition:NICU Discharge instruction: per After Visit Summary and Postpartum booklet. Activity: Advance as tolerated. Pelvic rest for 6 weeks.   Diet: carb modified diet Anticipated Birth Control: Plans Interval BTL Postpartum Appointment:1 week Additional Postpartum F/U: BP check 1 week Future Appointments:No future appointments. Follow up Visit:  Follow-up Information     [facility]. Schedule an appointment as soon as possible for a visit.   Specialty: Obstetrics and Gynecology Why: for blood pressure check Contact information: [address] [place]  [address] [phone]                    07/28/2023 Ovid DELENA All, MD

## 2023-07-31 ENCOUNTER — Telehealth (HOSPITAL_COMMUNITY): Payer: Self-pay

## 2023-07-31 NOTE — Telephone Encounter (Signed)
 07/31/2023 1304  Name: Abimbola Aki MRN: [id number] DOB: 02/20/1986  Reason for Call:  Transition of Care Hospital Discharge Call  Contact Status: Patient Contact Status: Message  Language assistant needed:          Follow-Up Questions:    Van Postnatal Depression Scale:  In the Past 7 Days:    PHQ2-9 Depression Scale:     Discharge Follow-up:    Post-discharge interventions: NA  Signature  Rosaline Deretha PEAK

## 2023-08-01 ENCOUNTER — Ambulatory Visit (HOSPITAL_COMMUNITY): Payer: Self-pay

## 2023-08-01 NOTE — Lactation Note (Signed)
 This note was copied from a baby's chart.  NICU Lactation Consultation Note  Patient Name: Hayley Webb Date: 08/01/2023 Age:36 days  Reason for consult: NICU baby; Weekly NICU follow-up; Infant < 6lbs; Late-preterm 34-36.6wks; Other (Comment); Breastfeeding assistance; Mother's request; RN request (gHTN, AMA)  SUBJECTIVE Visited with family of 19 54/68 weeks old AGA NICU female; Hayley Webb requested assistance for the 3 pm feeding, she wanted to try the first latch with baby Hayley Webb. She reports pumping is going well, she's doing it consistently and her supply continues increasing, praised her for efforts. This LC took baby Hayley Webb to the L side in cross cradle hold but he kept slipping off the breast with each attempt. Once NS # 20 was introduced he was able to sustain the latch with a few small audible swallows and a mix of short bursts with long extensions of the jaw; her nursed for 10 minutes on and off (see LATCH score). No gulping noted despite maternal supply, parents have also been working on bottle feedings. Reviewed IDF 1/2 and anticipatory guidelines.               OBJECTIVE Infant data: Mother's Current Feeding Choice: Breast Milk  O2 Device: Room Air  Infant feeding assessment IDFTS - Readiness: 1 IDFTS - Quality: 3   Maternal data: [id number] Vaginal, Spontaneous Pumping frequency: 8-9 times/24 hours Pumped volume: 90 mL Flange Size: 18  WIC Program: No WIC Referral Sent?: No Pump: Stork Pump (Spectra  S2)  ASSESSMENT Infant: Latch: Repeated attempts needed to sustain latch, nipple held in mouth throughout feeding, stimulation needed to elicit sucking reflex. Audible Swallowing: A few with stimulation Type of Nipple: Everted at rest and after stimulation Comfort (Breast/Nipple): Soft / non-tender Hold (Positioning): Assistance needed to correctly position infant at breast and maintain latch. LATCH Score: 7  Feeding Status: Scheduled  9-12-3-6 Feeding method: Breast Nipple Type: Dr. Jonna Fling Preemie  Maternal: Milk volume: Normal  INTERVENTIONS/PLAN Interventions: Interventions: Breast feeding basics reviewed; Assisted with latch; Skin to skin; Breast massage; Hand express; Breast compression; Adjust position; Support pillows; DEBP; Education; Infant Driven Feeding Algorithm education Tools: Flanges; Pump; Nipple Shields Pump Education: Setup, frequency, and cleaning; Milk Storage Nipple shield size: 20  Plan: Encouraged pumping on maintain mode every 3 hours, ideally 8 pumping sessions/24 hours She'll start taking Hayley Webb to a full breast on feeding cues around feeding times  using NS # 20 PRN and will call for assistance when needed Parents will continue advancing on bottle feedings   No other support person at this time. All questions and concerns answered, family to contact [facility] services PRN.  Consult Status: NICU follow-up NICU Follow-up type: Weekly NICU follow up   Hayley Webb S Miriam 08/01/2023, 4:40 PM

## 2023-08-02 ENCOUNTER — Ambulatory Visit: Payer: Medicaid Other

## 2023-08-09 ENCOUNTER — Ambulatory Visit (HOSPITAL_COMMUNITY): Payer: Self-pay

## 2023-08-09 ENCOUNTER — Ambulatory Visit: Payer: Medicaid Other

## 2023-08-09 NOTE — Lactation Note (Signed)
This note was copied from a baby's chart.  NICU Lactation Consultation Note  Patient Name: Hayley Webb [id number] Date: 08/09/2023 Age:36 wk.o.  Reason for consult: Weekly NICU follow-up; Late-preterm 34-36.6wks; NICU baby; Infant < 6lbs  SUBJECTIVE Visited with family of 37 43/22 weeks old AGA NICU female; Ms. Dagraca is a P4 and reported that she's still pumping consistently and her supply remains stable, praised her for her efforts. She's been using the # 18-21 inserts for her Spectra pump at home. She's been mostly working on pumping and bottle feeding while in the NICU but she's open to taking baby to breast occasionally once he goes home. Baby "[name]" might be getting discharged today. Reviewed discharge education and the importance of pumping after feedings/attempts at the breast to protect her supply. Referral to [facility] sent for [facility] OP F/U. No other support person at this time. All questions and concerns answered, family to contact [facility] services PRN.  OBJECTIVE Infant data: Mother's Current Feeding Choice: Breast Milk  O2 Device: Room Air  Infant feeding assessment IDFTS - Readiness: 1 IDFTS - Quality: 2   Maternal data: [id number] Vaginal, Spontaneous Pumping frequency: 6-8 times/24 hours Pumped volume: 90 mL (90-100 ml)  WIC Program: No WIC Referral Sent?: No Pump: Stork Pump (Spectra S2)  ASSESSMENT Infant: Feeding Status: Ad lib Feeding method: Bottle Nipple Type: Dr. Levert Feinstein Preemie  Maternal: Milk volume: Normal  INTERVENTIONS/PLAN Interventions: Interventions: Breast feeding basics reviewed; DEBP; Education Discharge Education: Outpatient recommendation; Outpatient Epic message sent  Plan: Consult Status: Complete   Sonja Manseau Venetia Constable 08/09/2023, 11:25 AM

## 2023-09-12 NOTE — Progress Notes (Signed)
 Sent message, via epic in basket, requesting orders in epic from Careers adviser.

## 2023-09-17 ENCOUNTER — Other Ambulatory Visit: Payer: Self-pay | Admitting: Obstetrics & Gynecology

## 2023-09-17 NOTE — Progress Notes (Signed)
 Second request for pre op orders in [facility] spoke with Del.

## 2023-09-18 NOTE — Progress Notes (Signed)
 COVID Vaccine received:  []  No []  Yes Date of any COVID positive Test in last 90 days:  PCP - Janeece Agee, NP [phone] (Work)  [phone] (Fax)  Cardiologist -   Chest x-ray -  EKG -07-22-2023  Epic  Stress Test -  ECHO -02-12-2017  Epic  Cardiac Cath -   PCR screen: []  Ordered & Completed []   No Order but Needs PROFEND     [x]   N/A for this surgery  Surgery Plan:  [x]  Ambulatory   []  Outpatient in bed  []  Admit Anesthesia:    [x]  General  []  Spinal  []   Choice []   MAC  Bowel Prep - [x]  No  []   Yes ______  Pacemaker / ICD device [x]  No []  Yes   Spinal Cord Stimulator:[x]  No []  Yes       History of Sleep Apnea? [x]  No []  Yes   CPAP used?- [x]  No []  Yes    Does the patient monitor blood sugar?   []  N/A   []  No []  Yes  Patient has: []  NO Hx DM   []  Pre-DM   []  DM1  []   DM2 Last A1c was:        on       Blood Thinner / Instructions:  Lovenox injections ?   Aspirin Instructions: none  ERAS Protocol Ordered: []  No  []  Yes PRE-SURGERY []  ENSURE  []  G2   []  No Drink Ordered  Patient is to be NPO after:    NO ORDERS  Dental hx: []  Dentures:  []  N/A      []  Bridge or Partial:                   []  Loose or Damaged teeth:   Comments:   Activity level: Patient is able / unable to climb a flight of stairs without difficulty; []  No CP  []  No SOB, but would have ___   Patient can / can not perform ADLs without assistance.   Anesthesia review: Delivered baby 07-22-23 at 33 weeks d/t eclampsia & seizure, Lupus, antiphospholipid syndrome, Hx Chronic PE, GAD, Hx Phentermine and Plaquenil,   Patient denies shortness of breath, fever, cough and chest pain at PAT appointment.  Patient verbalized understanding and agreement to the Pre-Surgical Instructions that were given to them at this PAT appointment. Patient was also educated of the need to review these PAT instructions again prior to her surgery.I reviewed the appropriate phone numbers to call if they have any and questions or  concerns.

## 2023-09-18 NOTE — Patient Instructions (Addendum)
 SURGICAL WAITING ROOM VISITATION Patients having surgery or a procedure may have no more than 2 support people in the waiting area - these visitors may rotate in the visitor waiting room.   Due to an increase in RSV and influenza rates and associated hospitalizations, children ages 35 and under may not visit patients in [facility] hospitals. If the patient needs to stay at the hospital during part of their recovery, the visitor guidelines for inpatient rooms apply.  PRE-OP VISITATION  Pre-op nurse will coordinate an appropriate time for 1 support person to accompany the patient in pre-op.  This support person may not rotate.  This visitor will be contacted when the time is appropriate for the visitor to come back in the pre-op area.  Please refer to the [facility] website for the visitor guidelines for Inpatients (after your surgery is over and you are in a regular room).  You are not required to quarantine at this time prior to your surgery. However, you must do this: Hand Hygiene often Do NOT share personal items Notify your provider if you are in close contact with someone who has COVID or you develop fever 100.4 or greater, new onset of sneezing, cough, sore throat, shortness of breath or body aches.  If you test positive for Covid or have been in contact with anyone that has tested positive in the last 10 days please notify you surgeon.    Your procedure is scheduled on:  Friday  October 05, 2023  Report to [facility] Main Entrance: [facility]sen entrance where the [employer] is available.   Report to admitting at: 11:45    AM  Call this number if you have any questions or problems the morning of surgery [phone]  FOLLOW ANY ADDITIONAL PRE OP INSTRUCTIONS YOU RECEIVED FROM YOUR SURGEON'S OFFICE!!!  Do not eat food after Midnight the night prior to your surgery/procedure.  After Midnight you may have the following liquids until    ????     AM / PM DAY OF SURGERY  Clear  Liquid Diet Water Black Coffee (sugar ok, NO MILK/CREAM OR CREAMERS)  Tea (sugar ok, NO MILK/CREAM OR CREAMERS) regular and decaf                             Plain Jell-O  with no fruit (NO RED)                                           Fruit ices (not with fruit pulp, NO RED)                                     Popsicles (NO RED)                                                                  Juice: NO CITRUS JUICES: only apple, WHITE grape, WHITE cranberry Sports drinks like Gatorade or Powerade (NO RED)  The day of surgery:  Drink ONE (1) Pre-Surgery Clear Ensure or G2 at   ????     AM the morning of surgery. Drink in one sitting. Do not sip.  This drink was given to you during your hospital pre-op appointment visit. Nothing else to drink after completing the Pre-Surgery Clear Ensure or G2 : No candy, chewing gum or throat lozenges.     Oral Hygiene is also important to reduce your risk of infection.        Remember - BRUSH YOUR TEETH THE MORNING OF SURGERY WITH YOUR REGULAR TOOTHPASTE  Do NOT smoke after Midnight the night before surgery.  STOP TAKING all Vitamins, Herbs and supplements 1 week before your surgery.   Take ONLY these medicines the morning of surgery with A SIP OF WATER: Labetalol and Tylenol if needed for pain.                    You may not have any metal on your body including hair pins, jewelry, and body piercing  Do not wear make-up, lotions, powders, perfumes or deodorant  Do not wear nail polish including gel and S&S, artificial / acrylic nails, or any other type of covering on natural nails including finger and toenails. If you have artificial nails, gel coating, etc., that needs to be removed by a nail salon, Please have this removed prior to surgery. Not doing so may mean that your surgery could be cancelled or delayed if the Surgeon or anesthesia staff feels like they are unable to monitor you safely.   Do not shave 48 hours prior to  surgery to avoid nicks in your skin which may contribute to postoperative infections.   Contacts, Hearing Aids, dentures or bridgework may not be worn into surgery. DENTURES WILL BE REMOVED PRIOR TO SURGERY PLEASE DO NOT APPLY "Poly grip" OR ADHESIVES!!!  Patients discharged on the day of surgery will not be allowed to drive home.  Someone NEEDS to stay with you for the first 24 hours after anesthesia.  Do not bring your home medications to the hospital. The Pharmacy will dispense medications listed on your medication list to you during your admission in the Hospital.  Please read over the following fact sheets you were given: IF YOU HAVE QUESTIONS ABOUT YOUR PRE-OP INSTRUCTIONS, PLEASE CALL [phone]   [facility] - Preparing for Surgery Before surgery, you can play an important role.  Because skin is not sterile, your skin needs to be as free of germs as possible.  You can reduce the number of germs on your skin by washing with CHG (chlorahexidine gluconate) soap before surgery.  CHG is an antiseptic cleaner which kills germs and bonds with the skin to continue killing germs even after washing. Please DO NOT use if you have an allergy to CHG or antibacterial soaps.  If your skin becomes reddened/irritated stop using the CHG and inform your nurse when you arrive at Short Stay. Do not shave (including legs and underarms) for at least 48 hours prior to the first CHG shower.  You may shave your face/neck.  Please follow these instructions carefully:  1.  Shower with CHG Soap the night before surgery and the  morning of surgery.  2.  If you choose to wash your hair, wash your hair first as usual with your normal  shampoo.  3.  After you shampoo, rinse your hair and body thoroughly to remove the shampoo.  4.  Use CHG as you would any other liquid soap.  You can apply chg directly to the skin and wash.  Gently with a scrungie or clean washcloth.  5.  Apply the CHG Soap  to your body ONLY FROM THE NECK DOWN.   Do not use on face/ open                           Wound or open sores. Avoid contact with eyes, ears mouth and genitals (private parts).                       Wash face,  Genitals (private parts) with your normal soap.             6.  Wash thoroughly, paying special attention to the area where your  surgery  will be performed.  7.  Thoroughly rinse your body with warm water from the neck down.  8.  DO NOT shower/wash with your normal soap after using and rinsing off the CHG Soap.            9.  Pat yourself dry with a clean towel.            10.  Wear clean pajamas.            11.  Place clean sheets on your bed the night of your first shower and do not  sleep with pets.  ON THE DAY OF SURGERY : Do not apply any lotions/deodorants the morning of surgery.  Please wear clean clothes to the hospital/surgery center.     FAILURE TO FOLLOW THESE INSTRUCTIONS MAY RESULT IN THE CANCELLATION OF YOUR SURGERY  PATIENT SIGNATURE_________________________________  NURSE SIGNATURE__________________________________  ________________________________________________________________________

## 2023-09-19 ENCOUNTER — Encounter (HOSPITAL_COMMUNITY)
Admission: RE | Admit: 2023-09-19 | Discharge: 2023-09-19 | Disposition: A | Discharge status: Home / Self Care | Payer: Medicaid Other | Source: Ambulatory Visit | Attending: Anesthesiology | Admitting: Anesthesiology

## 2023-09-19 DIAGNOSIS — D6861 Antiphospholipid syndrome: Secondary | ICD-10-CM

## 2023-09-19 DIAGNOSIS — Z01818 Encounter for other preprocedural examination: Secondary | ICD-10-CM

## 2023-09-19 DIAGNOSIS — T505X5D Adverse effect of appetite depressants, subsequent encounter: Secondary | ICD-10-CM

## 2023-10-04 ENCOUNTER — Encounter (HOSPITAL_COMMUNITY)

## 2023-10-09 ENCOUNTER — Other Ambulatory Visit: Payer: Self-pay | Admitting: Obstetrics & Gynecology

## 2023-10-21 ENCOUNTER — Telehealth

## 2023-10-22 ENCOUNTER — Encounter (HOSPITAL_COMMUNITY): Payer: Self-pay | Admitting: Obstetrics & Gynecology

## 2023-10-22 NOTE — Progress Notes (Signed)
 Spoke w/ via phone for pre-op interview--- Hayley Webb needs dos----   CBC, T&S, UPT per surgeon BMP per anesthesia.      Lab results------Current EKG in Epic dated 07/22/23 COVID test -----patient states asymptomatic no test needed Arrive at ------- NPO after MN NO Solid Food.   Pre-Surgery Ensure or G2:  Med rec completed Medications to take morning of surgery ----- Labatelol and Plaquenel Diabetic medication -----  GLP1 agonist last dose: GLP1 instructions:  Patient instructed no nail polish to be worn day of surgery Patient instructed to bring photo id and insurance card day of surgery Patient aware to have Driver (ride ) / caregiver    for 24 hours after surgery - Husband Psychologist, clinical Patient Special Instructions -----shower with antibacterial soap. Pre-Op special Instructions -----  Patient verbalized understanding of instructions that were given at this phone interview. Patient denies chest pain, sob, fever, cough at the interview.   Patient is on Lovenox, surgery scheduler (Del) contacted about instructions for holding or continuing for surgery. Office to notify patient of instructions.

## 2023-10-24 NOTE — H&P (Signed)
 Hayley Webb is an 38 y.o. female [id number]  here for robot assisted laparoscopic bilateral salpingectomy for sterilization.   Pertinent Gynecological History: Menses:  regular, monthly.  Bleeding: None currently.  Contraception:  Phexxi DES exposure: unknown Blood transfusions: none Sexually transmitted diseases: History of gonorrhea, chlamydia.  Previous GYN Procedures:  None.    Last mammogram:  N/A   Last pap: normal Date: 08/05/20  Menstrual History: No LMP recorded.    Past Medical History:  Diagnosis Date   Allergy to shellfish 06/11/2018   Altered thought processes 04/04/2017   r/o embolic event     Anxiety    doing good now   Anxiety disorder 12/23/2018   Asthma    Chest pain 04/04/2017   Chronic hypertension affecting pregnancy 07/25/2018   Chronic hypertension complicating or reason for care during pregnancy, third trimester 01/12/2019   COVID 07/2021   mild   Depression    doing good   Depressive disorder 12/23/2018   Headache(784.0)    Hirsutism 10/02/2012   HSV infection    Hypertension    was taken off meds after last preg   Infection    UTI   Knee pain 01/12/2020   Left knee pain 10/20/2020   Lupus    dx age 20   Nonintractable headache 04/04/2017   Normal postpartum course 01/17/2019   Obesity-BMI 47 05/09/2013   Pneumonia    blood clot appeared during the pneumonia   Postpartum anemia 01/17/2019   Preeclampsia 01/25/2022   Pregnant 07/01/2018   Pulmonary embolism (HCC) 06/4/82011   Pulmonary embolus (HCC) 02/09/2017   Right knee pain 10/12/2020   Shortness of breath    Sicca syndrome (HCC) 08/27/2018   STD (female) 01/18/2012   Hx GC 8/10  Hx Chlamydia 6/13     STD (sexually transmitted disease) 02/2009   POSITIVE GC   Threatened preterm labor 12/25/2018   Threatened preterm labor, third trimester 12/23/2018   UTI (urinary tract infection)    Word finding difficulty 04/04/2017    Past Surgical History:  Procedure Laterality Date    ADENOIDECTOMY     TONSILLECTOMY AND ADENOIDECTOMY  1998   WISDOM TOOTH EXTRACTION      Family History  Problem Relation Age of Onset   Asthma Mother    Healthy Father    Asthma Brother    Diabetes Maternal Aunt    Lupus Paternal Aunt    Stroke Paternal Aunt    Cancer Maternal Grandmother 72       COLON CA   Diabetes Maternal Grandmother    Hypertension Maternal Grandfather    Cancer Maternal Grandfather        prostate   Cancer Paternal Grandmother        breast   Lupus Paternal Grandmother    Anesthesia problems Neg Hx    Hypotension Neg Hx    Malignant hyperthermia Neg Hx    Pseudochol deficiency Neg Hx     Social History:  reports that she has never smoked. She has never used smokeless tobacco. She reports that she does not currently use alcohol. She reports that she does not use drugs.  Allergies:  Allergies  Allergen Reactions   Bactrim Anaphylaxis and Hives   Hydrocodone Anaphylaxis   Nifedipine Swelling and Anaphylaxis    Swelling of tongue   Pineapple Itching   Prednisone Anaphylaxis and Hives    Has been on Dexamethasone without issue.   Shrimp Extract Itching    Current Outpatient Medications  Medication Instructions  acetaminophen (TYLENOL) 650 mg, Oral, Every 4 hours PRN   benzocaine-Menthol (DERMOPLAST) 20-0.5 % AERO 1 Application, Topical, As needed   enalapril (VASOTEC) 10 mg, Oral, Daily   enoxaparin (LOVENOX) 150 mg, Subcutaneous, Every 12 hours   hydroxychloroquine (PLAQUENIL) 200 mg, Daily   ibuprofen (ADVIL) 600 mg, Oral, Every 6 hours   labetalol (NORMODYNE) 600 mg, Oral, Every 6 hours   Prenatal Vit-Fe Fumarate-FA (PRENATAL MULTIVITAMIN) TABS tablet 1 tablet, Oral, Daily   Vitamin D 2,000 Units, Daily  Patient last used lovenox on 10/23/23 night time.    Review of Systems Constitutional: Denies fevers/chills Cardiovascular: Denies chest pain or palpitations Pulmonary: Denies coughing or wheezing Gastrointestinal: Denies nausea, vomiting  or diarrhea Genitourinary: Denies pelvic pain, unusual vaginal bleeding, unusual vaginal discharge, dysuria, urgency or frequency.  Musculoskeletal: Denies muscle or joint aches and pain.  Neurology: Denies abnormal sensations such as tingling or numbness.    Height 5' 8.5" (1.74 m), weight (!) 140.6 kg, currently breastfeeding. BMI 46.45 kg/m2.  Physical Exam Vitals reviewed. Constitutional: She is oriented to person, place, and time. She appears well-developed and well-nourished.  HENT:  Head: Normocephalic and atraumatic.  Eyes: Conjunctivae and EOM are normal.  Neck: Normal range of motion. Neck supple.  Cardiovascular: Normal rate, regular rhythm, normal heart sounds and intact distal pulses.  Respiratory: Effort normal and breath sounds normal.  GI: Soft. She exhibits no mass. There is no tenderness.  Genitourinary: Vagina normal and uterus normal.  Musculoskeletal: Normal range of motion.  Neurological: She is alert and oriented to person, place, and time.  Skin: Skin is warm and dry.  Psychiatric: She has a normal mood and affect. Judgment normal.    Assessment/Plan: 38 y/o multiparous patient here for robot assisted bilateral laparoscopic salpingectomy for sterilization, - Admit to [facility] OR- Same Day surgery as per orders.  -  We discussed risks, benefits and alternatives of robot assisted bilateral laparoscopic salpingectomy for sterilization to include but not limited to risks of bleeding, infection, damage to organs.  She also understood the risk of tubal regret but she states she is 100% sure she did not want any more children.  We discussed that complete removal of both tubes will ensure complete sterilization but as with every procedure it is not 100% guaranteed.  She understood there were other kinds of birth control such as pills, patches, IUDs, vaginal rings and depo provera which were temporary but she did not desire them.  She understood there was also an option of  female sterilization but she did not desire that option either.  She did not desire partial salpingectomy or tubal fulguration or placement of tubal clips.    All her questions were answered and she was consented for the procedure.  She expressed understanding that if she does have irregular or heavy bleeding after the sterilization she may require hormone control in the form of birth control devices or medication to regulate her cycles.     - Case was cancelled by anesthesia as patient had had respiratory symptoms within 1 week. She will be rescheduled for 4 to 6 weeks from now as per anesthesia.   Prescilla Sours, MD.  10/25/2023.

## 2023-10-24 NOTE — Op Note (Incomplete)
 Patient:  DOB:  MRN:  Date of procedure:   PREOP DIAGNOSIS:  22. 38 year old Para 4 female who desires permanent sterilization.      Post operative diagnosis:  Same as above.        Procedure:  Robot assisted laparoscopic bilateral salpingectomy.   Surgeon: Dr. Hoover Browns.  Assistant: * .   SURGEON ATTESTATION: I was present and scrubbed for the entire case.  An experienced assistant was required given the standard of surgical care given the complexity of the case.  This assistant was needed for exposure, retraction, instrument exchange and for overall help during the surgery.    Anesthesia: General ETA, DrMarland Kitchen  Complications: None.  Findings: Normal uterus, normal left and right ovaries , normal left and right fallopian tubes. Normal bowels.   IV Fluids: *.   Urine:  * .   EBL: * cc.   Indications: 38 y/o female Para 4 who desired permanent sterilization via bilateral salpingectomy.    Procedure: Informed consent was obtained from the patient to undergo the procedure after discussing risks that include but not limited to risks of bleeding, infection and damage to organs.  She was taken to the operating room and anesthesia was administered.  She was carefully positioned in the operating room table in dorsal lithotomy position with both arms tucked to her sides and on the Pink Trendelenburg Pad.  An exam done showed a small anteverted uterus and a closed cervix.  There were no palpable adnexal masses.   Abdomen perineum and vagina were prepped with 4% chlorhexidine gluconate prep.  She was  draped in the usual sterile fashion. A 16 french foley catheter was placed into her bladder.    A Graves speculum was placed into vagina.  Tenaculum was placed at anterior cervix.  The Acorn manipulator was placed into cervix.  Patient was placed in low dorsal lithotomy position.  Attention was then turned to the abdomen.  0.25% marcaine was injected in infraumbilical area and 3 mm incision made.  Veress needle was placed in and correct placement was confirmed with irrigation, suction and hanging drop test.   Gas was instilled with low opening pressures noted and to a maximum pressure of 15 mm Hg.  The incision was extended for an 8 mm port.  The 8 mm robotic trocar was placed with direct visualization with the 0 degree scope.  Upon entry there was no notable injury to organs or vessels.   She was placed in T-burg position and maximum gas pressure was set to 15 mm Hg.  Two other side ports were placed on the left and right side of the abdomen under direct visualization for robot arms 1 and 3, lateral and a little below the infraumbilical incision, about 10 cm from umbilicus bilaterally.  The robot was docked.  The infraumbilical port was docked, 30 degree robot camera was placed in, targeting performed then the other ports were docked: Bipolar grasper was placed in Arm 1, Camera in arm 2, vessel sealer in arm 3, robot arm 4 was stowed away.  The abdomen and pelvis were surveyed with the above findings.  From the fimbria to the cornual end the right fallopian tube was sealed then excised with the vessel sealer and placed in anterior cul de sac.  The left fallopian tube was similarly sealed and excised.  The specimens were delivered under direct visualization through arm 3 by the assistant using a laparoscopic grasper after removal of the vessel sealer.  Good hemostasis was noted on the remaining pedicles.  The long bipolar grasper was then removed with direct visualization.  The [employer] was undocked.  Gas was turned off and was allowed to escape from the cavity, then the umbilical port were removed under visualization with the 0 degree 5 mm camera.  Patient received five manual inflations from the anesthesia.  She was then taken out of Trendelenburg.  The skin incisions were closed with 4-0 monocryl.  Dermabond was then applied over the skin incisions.  Attention was then turned to perineum.  Speculum  was placed in and Acorn manipulator and tenaculum removed.  Hemostasis on the cervix was achieved with silver nitrate.  Patient was then cleaned and taken to the recovery room in stable condition.  Specimens: Left and right fallopian tubes to pathology.   Dr. Hoover Browns. *.

## 2023-10-25 ENCOUNTER — Encounter (HOSPITAL_COMMUNITY)
Admission: RE | Disposition: A | Discharge status: Home / Self Care | Payer: Self-pay | Source: Home / Self Care | Attending: Obstetrics & Gynecology

## 2023-10-25 ENCOUNTER — Encounter (HOSPITAL_COMMUNITY): Payer: Self-pay | Admitting: Anesthesiology

## 2023-10-25 ENCOUNTER — Ambulatory Visit (HOSPITAL_COMMUNITY)
Admission: RE | Admit: 2023-10-25 | Discharge: 2023-10-25 | Disposition: A | Discharge status: Home / Self Care | Payer: Medicaid Other | Attending: Obstetrics & Gynecology | Admitting: Obstetrics & Gynecology

## 2023-10-25 ENCOUNTER — Encounter (HOSPITAL_COMMUNITY): Payer: Self-pay | Admitting: Obstetrics & Gynecology

## 2023-10-25 DIAGNOSIS — Z538 Procedure and treatment not carried out for other reasons: Secondary | ICD-10-CM | POA: Insufficient documentation

## 2023-10-25 DIAGNOSIS — Z302 Encounter for sterilization: Secondary | ICD-10-CM | POA: Diagnosis present

## 2023-10-25 DIAGNOSIS — T505X5D Adverse effect of appetite depressants, subsequent encounter: Secondary | ICD-10-CM

## 2023-10-25 DIAGNOSIS — Z01818 Encounter for other preprocedural examination: Secondary | ICD-10-CM

## 2023-10-25 DIAGNOSIS — D6861 Antiphospholipid syndrome: Secondary | ICD-10-CM

## 2023-10-25 HISTORY — PX: XI ROBOTIC ASSISTED SALPINGECTOMY: SHX6824

## 2023-10-25 LAB — POCT PREGNANCY, URINE: Preg Test, Ur: NEGATIVE

## 2023-10-25 SURGERY — SALPINGECTOMY, ROBOT-ASSISTED
Anesthesia: General | Laterality: Bilateral

## 2023-10-25 MED ORDER — ACETAMINOPHEN 500 MG PO TABS
ORAL_TABLET | ORAL | Status: DC
Start: 2023-10-25 — End: 2023-10-25
  Filled 2023-10-25: qty 2

## 2023-10-25 MED ORDER — BUPIVACAINE HCL (PF) 0.25 % IJ SOLN
INTRAMUSCULAR | Status: AC
  Filled 2023-10-25: qty 30

## 2023-10-25 MED ORDER — ORAL CARE MOUTH RINSE: 15.0000 mL | Freq: Once | OROMUCOSAL | Status: DC

## 2023-10-25 MED ORDER — POVIDONE-IODINE 10 % EX SWAB: 2.0000 | Freq: Once | CUTANEOUS | Status: DC

## 2023-10-25 MED ORDER — LIDOCAINE 2% (20 MG/ML) 5 ML SYRINGE
INTRAMUSCULAR | Status: AC
Start: 2023-10-25 — End: ?
  Filled 2023-10-25: qty 5

## 2023-10-25 MED ORDER — ROCURONIUM BROMIDE 10 MG/ML (PF) SYRINGE
PREFILLED_SYRINGE | INTRAVENOUS | Status: AC
  Filled 2023-10-25: qty 10

## 2023-10-25 MED ORDER — METHYLENE BLUE (ANTIDOTE) 1 % IV SOLN
INTRAVENOUS | Status: AC
  Filled 2023-10-25: qty 10

## 2023-10-25 MED ORDER — MIDAZOLAM HCL 2 MG/2ML IJ SOLN
INTRAMUSCULAR | Status: AC
  Filled 2023-10-25: qty 2

## 2023-10-25 MED ORDER — CHLORHEXIDINE GLUCONATE 0.12 % MT SOLN
OROMUCOSAL | Status: AC
  Filled 2023-10-25: qty 15

## 2023-10-25 MED ORDER — PROPOFOL 10 MG/ML IV BOLUS
INTRAVENOUS | Status: AC
  Filled 2023-10-25: qty 20

## 2023-10-25 MED ORDER — ONDANSETRON HCL 4 MG/2ML IJ SOLN
INTRAMUSCULAR | Status: AC
  Filled 2023-10-25: qty 2

## 2023-10-25 MED ORDER — FENTANYL CITRATE (PF) 250 MCG/5ML IJ SOLN
INTRAMUSCULAR | Status: AC
  Filled 2023-10-25: qty 5

## 2023-10-25 MED ORDER — ACETAMINOPHEN 500 MG PO TABS: 1000.0000 mg | ORAL_TABLET | Freq: Once | ORAL | Status: DC

## 2023-10-25 MED ORDER — DEXAMETHASONE SODIUM PHOSPHATE 10 MG/ML IJ SOLN
INTRAMUSCULAR | Status: AC
  Filled 2023-10-25: qty 1

## 2023-10-25 MED ORDER — CHLORHEXIDINE GLUCONATE 0.12 % MT SOLN: 15.0000 mL | Freq: Once | OROMUCOSAL | Status: DC

## 2023-10-25 MED ORDER — LACTATED RINGERS IV SOLN: INTRAVENOUS | Status: DC

## 2023-10-25 SURGICAL SUPPLY — 39 items
APPLICATOR ARISTA FLEXITIP XL (MISCELLANEOUS) IMPLANT
CATH ROBINSON RED A/P 16FR (CATHETERS) ×1 IMPLANT
COVER BACK TABLE 60X90IN (DRAPES) ×1 IMPLANT
COVER TIP SHEARS 8 DVNC (MISCELLANEOUS) IMPLANT
DEFOGGER SCOPE WARMER CLEARIFY (MISCELLANEOUS) ×1 IMPLANT
DERMABOND ADVANCED .7 DNX12 (GAUZE/BANDAGES/DRESSINGS) ×1 IMPLANT
DRAPE ARM DVNC X/XI (DISPOSABLE) ×4 IMPLANT
DRAPE COLUMN DVNC XI (DISPOSABLE) ×1 IMPLANT
DRAPE SURG IRRIG POUCH 19X23 (DRAPES) ×1 IMPLANT
DRAPE UTILITY XL STRL (DRAPES) ×1 IMPLANT
DURAPREP 26ML APPLICATOR (WOUND CARE) ×1 IMPLANT
ELECT REM PT RETURN 9FT ADLT (ELECTROSURGICAL) ×1 IMPLANT
FORCEPS BPLR LNG DVNC XI (INSTRUMENTS) ×1 IMPLANT
GAUZE 4X4 16PLY ~~LOC~~+RFID DBL (SPONGE) IMPLANT
GLOVE BIOGEL PI IND STRL 7.0 (GLOVE) ×3 IMPLANT
GLOVE SURG SS PI 6.5 STRL IVOR (GLOVE) ×2 IMPLANT
HEMOSTAT ARISTA ABSORB 3G PWDR (HEMOSTASIS) IMPLANT
IRRIG SUCT STRYKERFLOW 2 WTIP (MISCELLANEOUS) IMPLANT
KIT TURNOVER CYSTO (KITS) ×1 IMPLANT
LEGGING LITHOTOMY PAIR STRL (DRAPES) IMPLANT
MANIFOLD NEPTUNE II (INSTRUMENTS) ×1 IMPLANT
NEEDLE INSUFFLATION 14GA 120MM (NEEDLE) ×1 IMPLANT
OBTURATOR OPTICAL STND 8 DVNC (TROCAR) ×1 IMPLANT
PACK ROBOT GYN CUSTOM WL (TRAY / TRAY PROCEDURE) ×1 IMPLANT
PACK ROBOTIC GOWN (GOWN DISPOSABLE) ×1 IMPLANT
PAD POSITIONING PINK XL (MISCELLANEOUS) ×1 IMPLANT
PAD PREP 24X48 CUFFED NSTRL (MISCELLANEOUS) ×1 IMPLANT
PROTECTOR NERVE ULNAR (MISCELLANEOUS) ×1 IMPLANT
SEAL UNIV 5-12 XI (MISCELLANEOUS) ×3 IMPLANT
SEALER VESSEL EXT DVNC XI (MISCELLANEOUS) ×1 IMPLANT
SET IRRIG Y TYPE TUR BLADDER L (SET/KITS/TRAYS/PACK) IMPLANT
SET TUBE SMOKE EVAC HIGH FLOW (TUBING) IMPLANT
SLEEVE SCD COMPRESS KNEE MED (STOCKING) ×1 IMPLANT
SUT MNCRL AB 4-0 PS2 18 (SUTURE) ×2 IMPLANT
SUT VICRYL 0 UR6 27IN ABS (SUTURE) IMPLANT
TOWEL OR 17X24 6PK STRL BLUE (TOWEL DISPOSABLE) ×1 IMPLANT
TRAY FOLEY MTR SLVR 16FR STAT (SET/KITS/TRAYS/PACK) ×1 IMPLANT
TUBING INSUFFLATION 10FT LAP (TUBING) ×1 IMPLANT
WATER STERILE IRR 1000ML POUR (IV SOLUTION) ×1 IMPLANT

## 2023-10-25 NOTE — Anesthesia Preprocedure Evaluation (Signed)
 Anesthesia Evaluation  Patient identified by MRN, date of birth, ID band Patient awake    Reviewed: Allergy & Precautions, H&P , NPO status , Patient's Chart, lab work & pertinent test results, reviewed documented beta blocker date and time   Airway Mallampati: II  TM Distance: >3 FB Neck ROM: Full    Dental no notable dental hx.    Pulmonary asthma , PE (2011, lovenox LD:)   Pulmonary exam normal breath sounds clear to auscultation       Cardiovascular hypertension, Pt. on medications and Pt. on home beta blockers Normal cardiovascular exam Rhythm:Regular Rate:Normal     Neuro/Psych  Headaches PSYCHIATRIC DISORDERS Anxiety Depression       GI/Hepatic negative GI ROS, Neg liver ROS,,,  Endo/Other    Class 3 obesityBMI 46  Renal/GU negative Renal ROS  negative genitourinary   Musculoskeletal negative musculoskeletal ROS (+)    Abdominal  (+) + obese  Peds negative pediatric ROS (+)  Hematology negative hematology ROS (+)   Anesthesia Other Findings   Reproductive/Obstetrics negative OB ROS                             Anesthesia Physical Anesthesia Plan  ASA: 3  Anesthesia Plan: General   Post-op Pain Management: Tylenol PO (pre-op)*, Toradol IV (intra-op)*, Ketamine IV* and Dilaudid IV   Induction: Intravenous  PONV Risk Score and Plan: 4 or greater and Ondansetron, Dexamethasone, Midazolam, Treatment may vary due to age or medical condition and Scopolamine patch - Pre-op  Airway Management Planned: Oral ETT  Additional Equipment: None  Intra-op Plan:   Post-operative Plan: Extubation in OR  Informed Consent: I have reviewed the patients History and Physical, chart, labs and discussed the procedure including the risks, benefits and alternatives for the proposed anesthesia with the patient or authorized representative who has indicated his/her understanding and acceptance.      Dental advisory given  Plan Discussed with: CRNA  Anesthesia Plan Comments: (2 PIVs)       Anesthesia Quick Evaluation

## 2023-10-25 NOTE — Progress Notes (Addendum)
 Upon arrival to pre-op, pt reported cold symptoms (congestion, sore throat) starting 1 week ago. States she did not take a covid or flu test. Feels that symptoms have improved but still having some dry/scratchy throat. Notified Dr. Salvadore Farber, who came to speak with the pt and discuss risk of anesthesia after recent URI and recommendation to wait 4-6 weeks. Pt amenable and spouse updated. Pt aware to expect call from MD office for rescheduling. Dr. Sallye Ober made aware.

## 2023-10-26 ENCOUNTER — Encounter (HOSPITAL_COMMUNITY): Payer: Self-pay | Admitting: Obstetrics & Gynecology

## 2023-10-31 ENCOUNTER — Encounter (HOSPITAL_COMMUNITY)

## 2023-11-28 ENCOUNTER — Encounter (HOSPITAL_COMMUNITY): Payer: Self-pay | Admitting: Anesthesiology

## 2023-11-29 NOTE — Progress Notes (Signed)
 Spoke w/ via phone for pre-op interview--- Unable to reach patient, VM left with instructions Lab needs dos---- APTT, Protime INR, CBC, UPT and T&S per surgeon. BMP per anesthesia.        Lab results------Current EKG in Epic dated 07/22/23.  COVID test -----patient states asymptomatic no test needed Arrive at -------1000 NPO after MN NO Solid Food.  Clear liquids from MN until---0900 Pre-Surgery Ensure or G2:  Med rec completed Medications to take morning of surgery -----Plaquenil , and Labatelol. Diabetic medication -----  GLP1 agonist last dose: GLP1 instructions:  Patient instructed no nail polish to be worn day of surgery Patient instructed to bring photo id and insurance card day of surgery Patient aware to have Driver (ride ) / caregiver    for 24 hours after surgery - Unknown unable to reach pt. Patient Special Instructions ----- Asked pt to shower with antibacterial soap. Pre-Op special Instructions -----  Patient verbalized understanding of instructions that were given at this phone interview. Patient denies chest pain, sob, fever, cough at the interview.

## 2023-11-29 NOTE — Progress Notes (Signed)
 Attempted to reach patient using both numbers listed in chart, unable to reach messages left. Asked for return call today since surgery has been added for tomorrow. Del surgery scheduler at [facility] notified that I cannot reach patient, surgery scheduler is going to attempt to reach patient and have her call me back to complete preop call/ instructions.

## 2023-11-30 ENCOUNTER — Ambulatory Visit (HOSPITAL_COMMUNITY): Admission: RE | Admit: 2023-11-30 | Source: Home / Self Care | Admitting: Obstetrics & Gynecology

## 2023-11-30 DIAGNOSIS — Z302 Encounter for sterilization: Secondary | ICD-10-CM

## 2023-11-30 SURGERY — SALPINGECTOMY, ROBOT-ASSISTED
Anesthesia: General | Laterality: Bilateral

## 2023-11-30 NOTE — Progress Notes (Signed)
 Pt is unable to have surgery today d/t issue w/ childcare.  Will notify the team.

## 2024-02-25 ENCOUNTER — Telehealth: Admitting: Physician Assistant

## 2024-02-25 DIAGNOSIS — J069 Acute upper respiratory infection, unspecified: Secondary | ICD-10-CM | POA: Diagnosis not present

## 2024-02-25 MED ORDER — CETIRIZINE HCL 10 MG PO TABS: 10.0000 mg | ORAL_TABLET | Freq: Every day | ORAL | 0 refills | Status: DC

## 2024-02-25 MED ORDER — FLUTICASONE PROPIONATE 50 MCG/ACT NA SUSP: 2.0000 | Freq: Every day | NASAL | 6 refills | Status: AC

## 2024-02-25 MED ORDER — BENZONATATE 100 MG PO CAPS: 100.0000 mg | ORAL_CAPSULE | Freq: Two times a day (BID) | ORAL | 0 refills | Status: DC | PRN

## 2024-02-25 NOTE — Progress Notes (Signed)
 Virtual Visit Consent   [name], you are scheduled for a virtual visit with a [name] provider today. Just as with appointments in the office, your consent must be obtained to participate. Your consent will be active for this visit and any virtual visit you may have with one of our providers in the next 365 days. If you have a MyChart account, a copy of this consent can be sent to you electronically.  As this is a virtual visit, video technology does not allow for your provider to perform a traditional examination. This may limit your provider's ability to fully assess your condition. If your provider identifies any concerns that need to be evaluated in person or the need to arrange testing (such as labs, EKG, etc.), we will make arrangements to do so. Although advances in technology are sophisticated, we cannot ensure that it will always work on either your end or our end. If the connection with a video visit is poor, the visit may have to be switched to a telephone visit. With either a video or telephone visit, we are not always able to ensure that we have a secure connection.  By engaging in this virtual visit, you consent to the provision of healthcare and authorize for your insurance to be billed (if applicable) for the services provided during this visit. Depending on your insurance coverage, you may receive a charge related to this service.  I need to obtain your verbal consent now. Are you willing to proceed with your visit today? Briea Mcenery has provided verbal consent on 02/25/2024 for a virtual visit (video or telephone). Hayley Webb, [place]  Date: 02/25/2024 5:17 PM   Virtual Visit via Video Note   I, Hayley Webb, connected with  [name]  ([id number], May 10, 1986) on 02/25/24 at  5:15 PM EDT by a video-enabled telemedicine application and verified that I am speaking with the correct person using two identifiers.  Location: Patient: Virtual Visit Location Patient:  Home Provider: Virtual Visit Location Provider: Home Office   I discussed the limitations of evaluation and management by telemedicine and the availability of in person appointments. The patient expressed understanding and agreed to proceed.    History of Present Illness: Hayley Webb is a 38 y.o. who identifies as a female who was assigned female at birth, and is being seen today for flu like symptoms.  HPI: URI  This is a recurrent problem. The current episode started today. The problem has been rapidly worsening. There has been no fever. Associated symptoms include congestion and rhinorrhea. She has tried nothing for the symptoms. The treatment provided no relief.    Problems:  Patient Active Problem List   Diagnosis Date Noted   Gestational hypertension w/o significant proteinuria in 3rd trimester 07/21/2023   History of pre-eclampsia in prior pregnancy, currently pregnant 04/06/2023   Chronic hypertension during pregnancy, antepartum 04/06/2023   Preterm labor 12/23/2018   Maternal obesity affecting pregnancy, antepartum 06/11/2018   On continuous oral anticoagulation 04/04/2017   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   Antiphospholipid antibody syndrome (HCC) 02/10/2017   History of pulmonary embolus (PE) 07/24/2016   Lupus    Asthma    HSV infection     Allergies:  Allergies  Allergen Reactions   Bactrim Anaphylaxis and Hives   Hydrocodone  Anaphylaxis   Nifedipine  Swelling and Anaphylaxis    Swelling of tongue   Pineapple Itching   Prednisone Anaphylaxis and Hives    Has been on Dexamethasone  without issue.  Shrimp Extract Itching   Medications:  Current Outpatient Medications:    acetaminophen  (TYLENOL ) 325 MG tablet, Take 2 tablets (650 mg total) by mouth every 4 (four) hours as needed (for pain scale < 4)., Disp: 30 tablet, Rfl: 1   benzocaine -Menthol  (DERMOPLAST) 20-0.5 % AERO, Apply 1 Application topically as needed for irritation (perineal  discomfort). (Patient not taking: Reported on 09/14/2023), Disp: 85 g, Rfl: 1   Cholecalciferol  (VITAMIN D ) 50 MCG (2000 UT) CAPS, Take 2,000 Units by mouth daily., Disp: , Rfl:    enalapril  (VASOTEC ) 10 MG tablet, Take 1 tablet (10 mg total) by mouth daily. (Patient taking differently: Take 20 mg by mouth daily. Per pt her MD increased to 20mg ), Disp: 30 tablet, Rfl: 0   enoxaparin  (LOVENOX ) 150 MG/ML injection, Inject 1 mL (150 mg total) into the skin every 12 (twelve) hours., Disp: 60 mL, Rfl: 0   hydroxychloroquine  (PLAQUENIL ) 200 MG tablet, Take 200 mg by mouth daily., Disp: , Rfl:    ibuprofen  (ADVIL ) 600 MG tablet, Take 1 tablet (600 mg total) by mouth every 6 (six) hours. (Patient not taking: Reported on 09/14/2023), Disp: 30 tablet, Rfl: 0   labetalol  (NORMODYNE ) 200 MG tablet, Take 3 tablets (600 mg total) by mouth every 6 (six) hours., Disp: 360 tablet, Rfl: 0   Prenatal Vit-Fe Fumarate-FA (PRENATAL MULTIVITAMIN) TABS tablet, Take 1 tablet by mouth daily at 12 noon., Disp: 30 tablet, Rfl: 0  Observations/Objective: Patient is well-developed, well-nourished in no acute distress.  Resting comfortably  at home.  Head is normocephalic, atraumatic.  No labored breathing.  Speech is clear and coherent with logical content.  Patient is alert and oriented at baseline.    Assessment and Plan: 1. Upper respiratory tract infection, unspecified type (Primary)   Patients present symptoms suspicious for URI. Home COVID negative Differentials include allergic rhinitis,  bacterial pneumonia, sinusitis. Do not suspect underlying cardiopulmonary process. I considered, but think unlikely, dangerous causes of this patient's symptoms to include ACS, CHF or pneumonia, pneumothorax. Patient is nontoxic appearing and not in need of emergent medical intervention.  Plan: reassurance, reassessment, over the counter medications, discharge with PCP follow-up  Follow Up Instructions: I discussed the assessment  and treatment plan with the patient. The patient was provided an opportunity to ask questions and all were answered. The patient agreed with the plan and demonstrated an understanding of the instructions.  A copy of instructions were sent to the patient via MyChart unless otherwise noted below.    The patient was advised to call back or seek an in-person evaluation if the symptoms worsen or if the condition fails to improve as anticipated.    Hayley Shuck, PA-C

## 2024-02-25 NOTE — Patient Instructions (Signed)
  Hayley Webb, thank you for joining [name], PA-C for today's virtual visit.  While this provider is not your primary care provider (PCP), if your PCP is located in our provider database this encounter information will be shared with them immediately following your visit.   A [facility] MyChart account gives you access to today's visit and all your visits, tests, and labs performed at [facility]  click here if you don't have a [facility] MyChart account or go to [email or web address]  Consent: (Patient) Hayley Webb provided verbal consent for this virtual visit at the beginning of the encounter.  Current Medications:  Current Outpatient Medications:    acetaminophen  (TYLENOL ) 325 MG tablet, Take 2 tablets (650 mg total) by mouth every 4 (four) hours as needed (for pain scale < 4)., Disp: 30 tablet, Rfl: 1   benzocaine -Menthol  (DERMOPLAST) 20-0.5 % AERO, Apply 1 Application topically as needed for irritation (perineal discomfort). (Patient not taking: Reported on 09/14/2023), Disp: 85 g, Rfl: 1   Cholecalciferol  (VITAMIN D ) 50 MCG (2000 UT) CAPS, Take 2,000 Units by mouth daily., Disp: , Rfl:    enalapril  (VASOTEC ) 10 MG tablet, Take 1 tablet (10 mg total) by mouth daily. (Patient taking differently: Take 20 mg by mouth daily. Per pt her MD increased to 20mg ), Disp: 30 tablet, Rfl: 0   enoxaparin  (LOVENOX ) 150 MG/ML injection, Inject 1 mL (150 mg total) into the skin every 12 (twelve) hours., Disp: 60 mL, Rfl: 0   hydroxychloroquine  (PLAQUENIL ) 200 MG tablet, Take 200 mg by mouth daily., Disp: , Rfl:    ibuprofen  (ADVIL ) 600 MG tablet, Take 1 tablet (600 mg total) by mouth every 6 (six) hours. (Patient not taking: Reported on 09/14/2023), Disp: 30 tablet, Rfl: 0   labetalol  (NORMODYNE ) 200 MG tablet, Take 3 tablets (600 mg total) by mouth every 6 (six) hours., Disp: 360 tablet, Rfl: 0   Prenatal Vit-Fe Fumarate-FA (PRENATAL MULTIVITAMIN) TABS tablet, Take 1 tablet by  mouth daily at 12 noon., Disp: 30 tablet, Rfl: 0   Medications ordered in this encounter:  No orders of the defined types were placed in this encounter.    *If you need refills on other medications prior to your next appointment, please contact your pharmacy*  Follow-Up: Call back or seek an in-person evaluation if the symptoms worsen or if the condition fails to improve as anticipated.  [facility] [phone]  Other Instructions Please report to the nearest Emergency room with any worsening symptoms. Follow up with primary care provider (PCP) in 2 -3 days.    If you have been instructed to have an in-person evaluation today at a local Urgent Care facility, please use the link below. It will take you to a list of all of our available [facility], including address, phone number and hours of operation. Please do not delay care.  [facility]  If you or a family member do not have a primary care provider, use the link below to schedule a visit and establish care. When you choose a [facility] primary care physician or advanced practice provider, you gain a long-term partner in health. Find a Primary Care Provider  Learn more about [facility]'s in-office and virtual care options: [facility] - Get Care Now

## 2024-03-01 ENCOUNTER — Telehealth: Admitting: Nurse Practitioner

## 2024-03-01 DIAGNOSIS — J40 Bronchitis, not specified as acute or chronic: Secondary | ICD-10-CM

## 2024-03-01 MED ORDER — METHYLPREDNISOLONE 4 MG PO TBPK: ORAL_TABLET | ORAL | 0 refills | Status: DC

## 2024-03-01 MED ORDER — AZITHROMYCIN 250 MG PO TABS: ORAL_TABLET | ORAL | 0 refills | Status: AC

## 2024-03-01 NOTE — Patient Instructions (Signed)
 Hayley Webb, thank you for joining [name], NP for today's virtual visit.  While this provider is not your primary care provider (PCP), if your PCP is located in our provider database this encounter information will be shared with them immediately following your visit.   A [facility] MyChart account gives you access to today's visit and all your visits, tests, and labs performed at [facility]  click here if you don't have a  MyChart account or go to [email or web address]  Consent: (Patient) Hayley Webb provided verbal consent for this virtual visit at the beginning of the encounter.  Current Medications:  Current Outpatient Medications:    azithromycin  (ZITHROMAX ) 250 MG tablet, Take 2 tablets on day 1, then 1 tablet daily on days 2 through 5, Disp: 6 tablet, Rfl: 0   methylPREDNISolone  (MEDROL  DOSEPAK) 4 MG TBPK tablet, Directions for 6 day taper: Day 1: 2 tablets before breakfast, 1 after both lunch & dinner and 2 at bedtime Day 2: 1 tab before breakfast, 1 after both lunch & dinner and 2 at bedtime Day 3: 1 tab at each meal & 1 at bedtime Day 4: 1 tab at breakfast, 1 at lunch, 1 at bedtime Day 5: 1 tab at breakfast & 1 tab at bedtime Day 6: 1 tab at breakfast, Disp: 21 tablet, Rfl: 0   acetaminophen  (TYLENOL ) 325 MG tablet, Take 2 tablets (650 mg total) by mouth every 4 (four) hours as needed (for pain scale < 4)., Disp: 30 tablet, Rfl: 1   benzocaine -Menthol  (DERMOPLAST) 20-0.5 % AERO, Apply 1 Application topically as needed for irritation (perineal discomfort). (Patient not taking: Reported on 09/14/2023), Disp: 85 g, Rfl: 1   benzonatate  (TESSALON ) 100 MG capsule, Take 1 capsule (100 mg total) by mouth 2 (two) times daily as needed for cough., Disp: 20 capsule, Rfl: 0   cetirizine  (ZYRTEC  ALLERGY) 10 MG tablet, Take 1 tablet (10 mg total) by mouth daily for 10 days., Disp: 10 tablet, Rfl: 0   Cholecalciferol  (VITAMIN D ) 50 MCG (2000 UT) CAPS, Take  2,000 Units by mouth daily., Disp: , Rfl:    enalapril  (VASOTEC ) 10 MG tablet, Take 1 tablet (10 mg total) by mouth daily. (Patient taking differently: Take 20 mg by mouth daily. Per pt her MD increased to 20mg ), Disp: 30 tablet, Rfl: 0   enoxaparin  (LOVENOX ) 150 MG/ML injection, Inject 1 mL (150 mg total) into the skin every 12 (twelve) hours., Disp: 60 mL, Rfl: 0   fluticasone  (FLONASE ) 50 MCG/ACT nasal spray, Place 2 sprays into both nostrils daily., Disp: 16 g, Rfl: 6   hydroxychloroquine  (PLAQUENIL ) 200 MG tablet, Take 200 mg by mouth daily., Disp: , Rfl:    ibuprofen  (ADVIL ) 600 MG tablet, Take 1 tablet (600 mg total) by mouth every 6 (six) hours. (Patient not taking: Reported on 09/14/2023), Disp: 30 tablet, Rfl: 0   labetalol  (NORMODYNE ) 200 MG tablet, Take 3 tablets (600 mg total) by mouth every 6 (six) hours., Disp: 360 tablet, Rfl: 0   Prenatal Vit-Fe Fumarate-FA (PRENATAL MULTIVITAMIN) TABS tablet, Take 1 tablet by mouth daily at 12 noon., Disp: 30 tablet, Rfl: 0   Medications ordered in this encounter:  Meds ordered this encounter  Medications   azithromycin  (ZITHROMAX ) 250 MG tablet    Sig: Take 2 tablets on day 1, then 1 tablet daily on days 2 through 5    Dispense:  6 tablet    Refill:  0    Supervising Provider:   BLAISE,  PHILIP O [[id number]]   methylPREDNISolone  (MEDROL  DOSEPAK) 4 MG TBPK tablet    Sig: Directions for 6 day taper: Day 1: 2 tablets before breakfast, 1 after both lunch & dinner and 2 at bedtime Day 2: 1 tab before breakfast, 1 after both lunch & dinner and 2 at bedtime Day 3: 1 tab at each meal & 1 at bedtime Day 4: 1 tab at breakfast, 1 at lunch, 1 at bedtime Day 5: 1 tab at breakfast & 1 tab at bedtime Day 6: 1 tab at breakfast    Dispense:  21 tablet    Refill:  0    Patient states she can take medrol  but not prednisone    Supervising Provider:   LAMPTEY, PHILIP O [phone]     *If you need refills on other medications prior to your next appointment, please  contact your pharmacy*  Follow-Up: Call back or seek an in-person evaluation if the symptoms worsen or if the condition fails to improve as anticipated.  [facility] [phone]  Other Instructions INSTRUCTIONS: use a humidifier for nasal congestion Drink plenty of fluids, rest and wash hands frequently to avoid the spread of infection Alternate tylenol  and Motrin  for relief of fever    If you have been instructed to have an in-person evaluation today at a local Urgent Care facility, please use the link below. It will take you to a list of all of our available [facility], including address, phone number and hours of operation. Please do not delay care.  [facility]  If you or a family member do not have a primary care provider, use the link below to schedule a visit and establish care. When you choose a [facility] primary care physician or advanced practice provider, you gain a long-term partner in health. Find a Primary Care Provider  Learn more about [facility]'s in-office and virtual care options:  - Get Care Now

## 2024-03-01 NOTE — Progress Notes (Signed)
 Virtual Visit Consent   Hayley Webb, you are scheduled for a virtual visit with a [facility] provider today. Just as with appointments in the office, your consent must be obtained to participate. Your consent will be active for this visit and any virtual visit you may have with one of our providers in the next 365 days. If you have a MyChart account, a copy of this consent can be sent to you electronically.  As this is a virtual visit, video technology does not allow for your provider to perform a traditional examination. This may limit your provider's ability to fully assess your condition. If your provider identifies any concerns that need to be evaluated in person or the need to arrange testing (such as labs, EKG, etc.), we will make arrangements to do so. Although advances in technology are sophisticated, we cannot ensure that it will always work on either your end or our end. If the connection with a video visit is poor, the visit may have to be switched to a telephone visit. With either a video or telephone visit, we are not always able to ensure that we have a secure connection.  By engaging in this virtual visit, you consent to the provision of healthcare and authorize for your insurance to be billed (if applicable) for the services provided during this visit. Depending on your insurance coverage, you may receive a charge related to this service.  I need to obtain your verbal consent now. Are you willing to proceed with your visit today? Hayley Webb has provided verbal consent on 03/01/2024 for a virtual visit (video or telephone). Hayley LELON Servant, NP  Date: 03/01/2024 4:57 PM   Virtual Visit via Video Note   I, Hayley Webb, connected with  Hayley Webb  ([id number], 1986/02/10) on 03/01/24 at  5:00 PM EDT by a video-enabled telemedicine application and verified that I am speaking with the correct person using two identifiers.  Location: Patient: Virtual Visit Location Patient:  Home Provider: Virtual Visit Location Provider: Home Office   I discussed the limitations of evaluation and management by telemedicine and the availability of in person appointments. The patient expressed understanding and agreed to proceed.    History of Present Illness: Hayley Webb is a 38 y.o. who identifies as a female who was assigned female at birth, and is being seen today for bronchitis.  Hayley Webb was seen for VV on 02-25-2024.  At that time she had complaints of chest and nasal congestion along with rhinorrhea.  She was prescribed Tessalon , antihistamine and nasal steroid spray at that time.  She states today despite using these medications her symptoms have worsened and she is currently experiencing shortness of breath, difficulty expectorating phlegm, cough and chest congestion.  She does have a history of asthma and is using her nebulizer and rescue inhaler every other hour.  She denies fever.  She has taken 2 COVID tests and both were negative with last test being 2 days ago.   Problems:  Patient Active Problem List   Diagnosis Date Noted   Gestational hypertension w/o significant proteinuria in 3rd trimester 07/21/2023   History of pre-eclampsia in prior pregnancy, currently pregnant 04/06/2023   Chronic hypertension during pregnancy, antepartum 04/06/2023   Preterm labor 12/23/2018   Maternal obesity affecting pregnancy, antepartum 06/11/2018   On continuous oral anticoagulation 04/04/2017   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   Antiphospholipid antibody syndrome (HCC) 02/10/2017   History of pulmonary embolus (PE) 07/24/2016   Lupus  Asthma    HSV infection     Allergies:  Allergies  Allergen Reactions   Bactrim Anaphylaxis and Hives   Hydrocodone  Anaphylaxis   Nifedipine  Swelling and Anaphylaxis    Swelling of tongue   Pineapple Itching   Prednisone Anaphylaxis and Hives    Has been on Dexamethasone  without issue.   Shrimp Extract Itching    Medications:  Current Outpatient Medications:    azithromycin  (ZITHROMAX ) 250 MG tablet, Take 2 tablets on day 1, then 1 tablet daily on days 2 through 5, Disp: 6 tablet, Rfl: 0   methylPREDNISolone  (MEDROL  DOSEPAK) 4 MG TBPK tablet, Directions for 6 day taper: Day 1: 2 tablets before breakfast, 1 after both lunch & dinner and 2 at bedtime Day 2: 1 tab before breakfast, 1 after both lunch & dinner and 2 at bedtime Day 3: 1 tab at each meal & 1 at bedtime Day 4: 1 tab at breakfast, 1 at lunch, 1 at bedtime Day 5: 1 tab at breakfast & 1 tab at bedtime Day 6: 1 tab at breakfast, Disp: 21 tablet, Rfl: 0   acetaminophen  (TYLENOL ) 325 MG tablet, Take 2 tablets (650 mg total) by mouth every 4 (four) hours as needed (for pain scale < 4)., Disp: 30 tablet, Rfl: 1   benzocaine -Menthol  (DERMOPLAST) 20-0.5 % AERO, Apply 1 Application topically as needed for irritation (perineal discomfort). (Patient not taking: Reported on 09/14/2023), Disp: 85 g, Rfl: 1   benzonatate  (TESSALON ) 100 MG capsule, Take 1 capsule (100 mg total) by mouth 2 (two) times daily as needed for cough., Disp: 20 capsule, Rfl: 0   cetirizine  (ZYRTEC  ALLERGY) 10 MG tablet, Take 1 tablet (10 mg total) by mouth daily for 10 days., Disp: 10 tablet, Rfl: 0   Cholecalciferol  (VITAMIN D ) 50 MCG (2000 UT) CAPS, Take 2,000 Units by mouth daily., Disp: , Rfl:    enalapril  (VASOTEC ) 10 MG tablet, Take 1 tablet (10 mg total) by mouth daily. (Patient taking differently: Take 20 mg by mouth daily. Per pt her MD increased to 20mg ), Disp: 30 tablet, Rfl: 0   enoxaparin  (LOVENOX ) 150 MG/ML injection, Inject 1 mL (150 mg total) into the skin every 12 (twelve) hours., Disp: 60 mL, Rfl: 0   fluticasone  (FLONASE ) 50 MCG/ACT nasal spray, Place 2 sprays into both nostrils daily., Disp: 16 g, Rfl: 6   hydroxychloroquine  (PLAQUENIL ) 200 MG tablet, Take 200 mg by mouth daily., Disp: , Rfl:    ibuprofen  (ADVIL ) 600 MG tablet, Take 1 tablet (600 mg total) by mouth every  6 (six) hours. (Patient not taking: Reported on 09/14/2023), Disp: 30 tablet, Rfl: 0   labetalol  (NORMODYNE ) 200 MG tablet, Take 3 tablets (600 mg total) by mouth every 6 (six) hours., Disp: 360 tablet, Rfl: 0   Prenatal Vit-Fe Fumarate-FA (PRENATAL MULTIVITAMIN) TABS tablet, Take 1 tablet by mouth daily at 12 noon., Disp: 30 tablet, Rfl: 0  Observations/Objective: Patient is well-developed, well-nourished in no acute distress.  Resting comfortably at home.  Head is normocephalic, atraumatic.  No labored breathing.  Speech is clear and coherent with logical content.  Patient is alert and oriented at baseline.    Assessment and Plan: 1. Bronchitis (Primary) - azithromycin  (ZITHROMAX ) 250 MG tablet; Take 2 tablets on day 1, then 1 tablet daily on days 2 through 5  Dispense: 6 tablet; Refill: 0 - methylPREDNISolone  (MEDROL  DOSEPAK) 4 MG TBPK tablet; Directions for 6 day taper: Day 1: 2 tablets before breakfast, 1 after both lunch & dinner  and 2 at bedtime Day 2: 1 tab before breakfast, 1 after both lunch & dinner and 2 at bedtime Day 3: 1 tab at each meal & 1 at bedtime Day 4: 1 tab at breakfast, 1 at lunch, 1 at bedtime Day 5: 1 tab at breakfast & 1 tab at bedtime Day 6: 1 tab at breakfast  Dispense: 21 tablet; Refill: 0  INSTRUCTIONS: use a humidifier for nasal congestion Drink plenty of fluids, rest and wash hands frequently to avoid the spread of infection Alternate tylenol  and Motrin  for relief of fever   Follow Up Instructions: I discussed the assessment and treatment plan with the patient. The patient was provided an opportunity to ask questions and all were answered. The patient agreed with the plan and demonstrated an understanding of the instructions.  A copy of instructions were sent to the patient via MyChart unless otherwise noted below.    The patient was advised to call back or seek an in-person evaluation if the symptoms worsen or if the condition fails to improve as  anticipated.    Margarette Vannatter W Kalid Ghan, NP

## 2024-03-16 ENCOUNTER — Telehealth: Admitting: Family

## 2024-03-16 DIAGNOSIS — J019 Acute sinusitis, unspecified: Secondary | ICD-10-CM | POA: Diagnosis not present

## 2024-03-16 DIAGNOSIS — B9689 Other specified bacterial agents as the cause of diseases classified elsewhere: Secondary | ICD-10-CM | POA: Diagnosis not present

## 2024-03-16 MED ORDER — AMOXICILLIN-POT CLAVULANATE 875-125 MG PO TABS: 1.0000 | ORAL_TABLET | Freq: Two times a day (BID) | ORAL | 0 refills | Status: DC

## 2024-03-16 NOTE — Progress Notes (Signed)
 Virtual Visit Consent   Hayley Webb, you are scheduled for a virtual visit with a [facility] provider today. Just as with appointments in the office, your consent must be obtained to participate. Your consent will be active for this visit and any virtual visit you may have with one of our providers in the next 365 days. If you have a MyChart account, a copy of this consent can be sent to you electronically.  As this is a virtual visit, video technology does not allow for your provider to perform a traditional examination. This may limit your provider's ability to fully assess your condition. If your provider identifies any concerns that need to be evaluated in person or the need to arrange testing (such as labs, EKG, etc.), we will make arrangements to do so. Although advances in technology are sophisticated, we cannot ensure that it will always work on either your end or our end. If the connection with a video visit is poor, the visit may have to be switched to a telephone visit. With either a video or telephone visit, we are not always able to ensure that we have a secure connection.  By engaging in this virtual visit, you consent to the provision of healthcare and authorize for your insurance to be billed (if applicable) for the services provided during this visit. Depending on your insurance coverage, you may receive a charge related to this service.  I need to obtain your verbal consent now. Are you willing to proceed with your visit today? Hayley Webb has provided verbal consent on 03/16/2024 for a virtual visit (video or telephone). Bari Learn, FNP  Date: 03/16/2024 12:54 PM   Virtual Visit via Video Note   I, Bari Learn, connected with  Hayley Webb  ([id number], 29-Jul-1985) on 03/16/24 at  1:00 PM EDT by a video-enabled telemedicine application and verified that I am speaking with the correct person using two identifiers.  Location: Patient: Virtual Visit Location Patient:  Home Provider: Virtual Visit Location Provider: Home Office   I discussed the limitations of evaluation and management by telemedicine and the availability of in person appointments. The patient expressed understanding and agreed to proceed.    History of Present Illness: Hayley Webb is a 38 y.o. who identifies as a female who was assigned female at birth, and is being seen today for congestion and cough that started yesterday. She had a negative COVID test.   She reports she was sick two weeks and diagnosed with Viral infection. She completed flonase  and tessalon . She was feeling better, but yesterday started feeling sick again.  HPI: Cough This is a new problem. The current episode started yesterday. The problem has been unchanged. The problem occurs every few minutes. The cough is Non-productive. Associated symptoms include chills, a fever, headaches, myalgias, nasal congestion, postnasal drip and shortness of breath. Pertinent negatives include no ear congestion, ear pain, sore throat or wheezing. She has tried rest for the symptoms. The treatment provided mild relief.    Problems:  Patient Active Problem List   Diagnosis Date Noted   Gestational hypertension w/o significant proteinuria in 3rd trimester 07/21/2023   History of pre-eclampsia in prior pregnancy, currently pregnant 04/06/2023   Chronic hypertension during pregnancy, antepartum 04/06/2023   Preterm labor 12/23/2018   Maternal obesity affecting pregnancy, antepartum 06/11/2018   On continuous oral anticoagulation 04/04/2017   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   Antiphospholipid antibody syndrome (HCC) 02/10/2017   History of pulmonary embolus (PE) 07/24/2016  Lupus    Asthma    HSV infection     Allergies:  Allergies  Allergen Reactions   Bactrim Anaphylaxis and Hives   Hydrocodone  Anaphylaxis   Nifedipine  Swelling and Anaphylaxis    Swelling of tongue   Pineapple Itching   Prednisone Anaphylaxis  and Hives    Has been on Dexamethasone  without issue.   Shrimp Extract Itching   Medications:  Current Outpatient Medications:    amoxicillin -clavulanate (AUGMENTIN ) 875-125 MG tablet, Take 1 tablet by mouth 2 (two) times daily., Disp: 14 tablet, Rfl: 0   acetaminophen  (TYLENOL ) 325 MG tablet, Take 2 tablets (650 mg total) by mouth every 4 (four) hours as needed (for pain scale < 4)., Disp: 30 tablet, Rfl: 1   benzonatate  (TESSALON ) 100 MG capsule, Take 1 capsule (100 mg total) by mouth 2 (two) times daily as needed for cough., Disp: 20 capsule, Rfl: 0   cetirizine  (ZYRTEC  ALLERGY) 10 MG tablet, Take 1 tablet (10 mg total) by mouth daily for 10 days., Disp: 10 tablet, Rfl: 0   Cholecalciferol  (VITAMIN D ) 50 MCG (2000 UT) CAPS, Take 2,000 Units by mouth daily., Disp: , Rfl:    enalapril  (VASOTEC ) 10 MG tablet, Take 1 tablet (10 mg total) by mouth daily. (Patient taking differently: Take 20 mg by mouth daily. Per pt her MD increased to 20mg ), Disp: 30 tablet, Rfl: 0   enoxaparin  (LOVENOX ) 150 MG/ML injection, Inject 1 mL (150 mg total) into the skin every 12 (twelve) hours., Disp: 60 mL, Rfl: 0   fluticasone  (FLONASE ) 50 MCG/ACT nasal spray, Place 2 sprays into both nostrils daily., Disp: 16 g, Rfl: 6   hydroxychloroquine  (PLAQUENIL ) 200 MG tablet, Take 200 mg by mouth daily., Disp: , Rfl:    ibuprofen  (ADVIL ) 600 MG tablet, Take 1 tablet (600 mg total) by mouth every 6 (six) hours. (Patient not taking: Reported on 09/14/2023), Disp: 30 tablet, Rfl: 0   labetalol  (NORMODYNE ) 200 MG tablet, Take 3 tablets (600 mg total) by mouth every 6 (six) hours., Disp: 360 tablet, Rfl: 0   methylPREDNISolone  (MEDROL  DOSEPAK) 4 MG TBPK tablet, Directions for 6 day taper: Day 1: 2 tablets before breakfast, 1 after both lunch & dinner and 2 at bedtime Day 2: 1 tab before breakfast, 1 after both lunch & dinner and 2 at bedtime Day 3: 1 tab at each meal & 1 at bedtime Day 4: 1 tab at breakfast, 1 at lunch, 1 at bedtime  Day 5: 1 tab at breakfast & 1 tab at bedtime Day 6: 1 tab at breakfast, Disp: 21 tablet, Rfl: 0   Prenatal Vit-Fe Fumarate-FA (PRENATAL MULTIVITAMIN) TABS tablet, Take 1 tablet by mouth daily at 12 noon., Disp: 30 tablet, Rfl: 0  Observations/Objective: Patient is well-developed, well-nourished in no acute distress.  Resting comfortably  at home.  Head is normocephalic, atraumatic.  No labored breathing.  Speech is clear and coherent with logical content.  Patient is alert and oriented at baseline.  Nasal congestion, sinus pressure   Assessment and Plan: 1. Acute bacterial sinusitis (Primary) - amoxicillin -clavulanate (AUGMENTIN ) 875-125 MG tablet; Take 1 tablet by mouth 2 (two) times daily.  Dispense: 14 tablet; Refill: 0  - Take meds as prescribed - Use a cool mist humidifier  -Use saline nose sprays frequently -Force fluids -For any cough or congestion  Use plain Mucinex - regular strength or max strength is fine -For fever or aces or pains- take tylenol  or ibuprofen . -Throat lozenges if help -Follow up if symptoms  worsen or do not improve   Follow Up Instructions: I discussed the assessment and treatment plan with the patient. The patient was provided an opportunity to ask questions and all were answered. The patient agreed with the plan and demonstrated an understanding of the instructions.  A copy of instructions were sent to the patient via MyChart unless otherwise noted below.     The patient was advised to call back or seek an in-person evaluation if the symptoms worsen or if the condition fails to improve as anticipated.    Bari Learn, FNP

## 2024-03-16 NOTE — Patient Instructions (Signed)

## 2024-04-03 ENCOUNTER — Ambulatory Visit: Payer: Self-pay

## 2024-04-03 NOTE — Telephone Encounter (Signed)
 FYI Only or Action Required?: FYI only for provider.  Patient was last seen in primary care on 03/16/2024 by Lavell Bari LABOR, FNP.  Called Nurse Triage reporting [place].  Symptoms began several months ago.  Interventions attempted: Nothing.  Symptoms are: stable.  Triage Disposition: See PCP When Office is Open (Within 3 Days)  Patient/caregiver understands and will follow disposition?: Yes. Reason for Disposition  [1] Small swelling or lump AND [2] unexplained AND [3] present > 1 week    TOC visit 9/16  Answer Assessment - Initial Assessment Questions Has been sick back to back the last 2-3 months with allergies and cold symptoms.   1. APPEARANCE of SWELLING: What does it look like?     Only something can feel, has to really turn neck to feel it  2. SIZE: How large is the swelling? (e.g., inches, cm; or compare to size of pinhead, tip of pen, eraser, coin, pea, grape, ping pong ball)      Feels about the size of a quarter, shrinks down to dime size, then gets bigger, not a consistent size  3. LOCATION: Where is the swelling located?     L side Under jaw, near possible lymph node in neck 4. ONSET: When did the swelling start?     2-3 months ago  5. COLOR: What color is it? Is there more than one color?     No  6. PAIN: Is there any pain? If Yes, ask: How bad is the pain? (Scale 1-10; or mild, moderate, severe)       No, tender to press on it  7. ITCH: Does it itch? If Yes, ask: How bad is the itch?      No  8 OTHER SYMPTOMS: Do you have any other symptoms? (e.g., fever)     No  Protocols used: Skin Lump or Localized Swelling-A-AH  Copied from CRM #[id number]. Topic: Clinical - Medical Advice >> Apr 03, 2024  3:26 PM [place] E wrote: Reason for CRM: Patient has recurring lump on the left side of her neck, been going on for the past 2-3 months, comes and goes. Stated that it is not painful. Patient has TOC appointment on 9/16, and questioning if she  should wait to get it checked out then. Callback number [phone].

## 2024-04-03 NOTE — Telephone Encounter (Signed)
 Patient has recurring lump on the left side of her neck, been going on for the past 2-3 months, comes and goes. Stated that it is not painful. Patient has appt with you 04/08/24 TOC.   FYI, I looked at your schedule and you are fully booked tomorrow.

## 2024-04-08 ENCOUNTER — Encounter: Admitting: Student in an Organized Health Care Education/Training Program

## 2024-04-22 ENCOUNTER — Encounter: Admitting: Student in an Organized Health Care Education/Training Program

## 2024-04-29 ENCOUNTER — Encounter: Admitting: Student in an Organized Health Care Education/Training Program

## 2024-04-29 DIAGNOSIS — O200 Threatened abortion: Secondary | ICD-10-CM | POA: Insufficient documentation

## 2024-04-29 DIAGNOSIS — R300 Dysuria: Secondary | ICD-10-CM | POA: Insufficient documentation

## 2024-04-29 DIAGNOSIS — N390 Urinary tract infection, site not specified: Secondary | ICD-10-CM | POA: Insufficient documentation

## 2024-04-29 DIAGNOSIS — R5383 Other fatigue: Secondary | ICD-10-CM | POA: Insufficient documentation

## 2024-04-29 DIAGNOSIS — R252 Cramp and spasm: Secondary | ICD-10-CM | POA: Insufficient documentation

## 2024-04-29 DIAGNOSIS — Z789 Other specified health status: Secondary | ICD-10-CM | POA: Insufficient documentation

## 2024-04-29 DIAGNOSIS — R42 Dizziness and giddiness: Secondary | ICD-10-CM | POA: Insufficient documentation

## 2024-04-29 DIAGNOSIS — O4693 Antepartum hemorrhage, unspecified, third trimester: Secondary | ICD-10-CM | POA: Insufficient documentation

## 2024-04-29 DIAGNOSIS — R110 Nausea: Secondary | ICD-10-CM | POA: Insufficient documentation

## 2024-04-29 DIAGNOSIS — R350 Frequency of micturition: Secondary | ICD-10-CM | POA: Insufficient documentation

## 2024-06-08 ENCOUNTER — Telehealth: Admitting: Family

## 2024-06-08 DIAGNOSIS — J209 Acute bronchitis, unspecified: Secondary | ICD-10-CM

## 2024-06-08 MED ORDER — PROMETHAZINE-DM 6.25-15 MG/5ML PO SYRP: 5.0000 mL | ORAL_SOLUTION | Freq: Three times a day (TID) | ORAL | 0 refills | Status: DC | PRN

## 2024-06-08 MED ORDER — METHYLPREDNISOLONE 4 MG PO TBPK: ORAL_TABLET | ORAL | 0 refills | Status: DC

## 2024-06-08 MED ORDER — BENZONATATE 200 MG PO CAPS: 200.0000 mg | ORAL_CAPSULE | Freq: Two times a day (BID) | ORAL | 0 refills | Status: DC | PRN

## 2024-06-08 NOTE — Progress Notes (Signed)
 Virtual Visit Consent   Hayley Webb, you are scheduled for a virtual visit with a [facility] provider today. Just as with appointments in the office, your consent must be obtained to participate. Your consent will be active for this visit and any virtual visit you may have with one of our providers in the next 365 days. If you have a MyChart account, a copy of this consent can be sent to you electronically.  As this is a virtual visit, video technology does not allow for your provider to perform a traditional examination. This may limit your provider's ability to fully assess your condition. If your provider identifies any concerns that need to be evaluated in person or the need to arrange testing (such as labs, EKG, etc.), we will make arrangements to do so. Although advances in technology are sophisticated, we cannot ensure that it will always work on either your end or our end. If the connection with a video visit is poor, the visit may have to be switched to a telephone visit. With either a video or telephone visit, we are not always able to ensure that we have a secure connection.  By engaging in this virtual visit, you consent to the provision of healthcare and authorize for your insurance to be billed (if applicable) for the services provided during this visit. Depending on your insurance coverage, you may receive a charge related to this service.  I need to obtain your verbal consent now. Are you willing to proceed with your visit today? Hayley Webb has provided verbal consent on 06/08/2024 for a virtual visit (video or telephone). Hayley Learn, FNP  Date: 06/08/2024 4:20 PM   Virtual Visit via Video Note   I, Hayley Webb, connected with  Hayley Webb  ([id number], 07-02-1986) on 06/08/24 at  4:15 PM EST by a video-enabled telemedicine application and verified that I am speaking with the correct person using two identifiers.  Location: Patient: Virtual Visit Location Patient:  Home Provider: Virtual Visit Location Provider: Home Office   I discussed the limitations of evaluation and management by telemedicine and the availability of in person appointments. The patient expressed understanding and agreed to proceed.    History of Present Illness: Hayley Webb is a 38 y.o. who identifies as a female who was assigned female at birth, and is being seen today for cough that started 3 days ago. Did a home COVID that was negative.   HPI: Cough This is a new problem. The current episode started in the past 7 days. The problem has been gradually worsening. The problem occurs every few minutes. The cough is Productive of sputum. Associated symptoms include chills, myalgias, nasal congestion, postnasal drip, a sore throat and shortness of breath. Pertinent negatives include no ear congestion, ear pain, fever, headaches or wheezing. She has tried rest and OTC cough suppressant for the symptoms. The treatment provided mild relief.    Problems:  Patient Active Problem List   Diagnosis Date Noted   Crampy pain 04/29/2024   Discomfort 04/29/2024   Dizziness 04/29/2024   Dysuria 04/29/2024   Fatigue 04/29/2024   Increased frequency of urination 04/29/2024   Nausea 04/29/2024   Third trimester bleeding 04/29/2024   Threatened abortion in first trimester 04/29/2024   Urinary tract infectious disease 04/29/2024   Gestational hypertension w/o significant proteinuria in 3rd trimester 07/21/2023   History of pre-eclampsia in prior pregnancy, currently pregnant 04/06/2023   Chronic hypertension during pregnancy, antepartum 04/06/2023   Vitamin D  deficiency 08/11/2021  History of preterm delivery 08/08/2021   Essential hypertension 05/24/2020   Mixed hyperlipidemia 05/24/2020   Preterm labor 12/23/2018   Maternal obesity affecting pregnancy, antepartum 06/11/2018   History of multiple allergies 06/11/2018   On continuous oral anticoagulation 04/04/2017   Recurrent pulmonary  embolism (HCC) 04/04/2017   Antiphospholipid antibody syndrome 02/10/2017   Drug therapy 09/28/2016   Hematuria, microscopic 09/20/2016   Proteinuria 09/20/2016   History of pulmonary embolus (PE) 07/24/2016   Lupus (systemic lupus erythematosus) (HCC) 05/30/2013   Symptom associated with female genital organs 09/20/2012   Lupus    HSV infection     Allergies:  Allergies  Allergen Reactions   Bactrim Anaphylaxis and Hives   Hydrocodone  Anaphylaxis   Hydrocodone -Acetaminophen  Anaphylaxis and Other (See Comments)    acetaminophen  / hydrocodone    Nifedipine  Swelling and Anaphylaxis    Swelling of tongue   Nsaids Other (See Comments)    Has Lupus, reccommended to avoid NSAIDs   Pineapple Itching   Prednisone Anaphylaxis and Hives    Has been on Dexamethasone  without issue.   Shrimp Extract Itching   Pineapple Extract Itching    pineapple extract   Shellfish Protein-Containing Drug Products Itching    shellfish derived   Medications:  Current Outpatient Medications:    benzonatate  (TESSALON ) 200 MG capsule, Take 1 capsule (200 mg total) by mouth 2 (two) times daily as needed for cough., Disp: 20 capsule, Rfl: 0   methylPREDNISolone  (MEDROL  DOSEPAK) 4 MG TBPK tablet, Take by mouth as directed., Disp: 21 tablet, Rfl: 0   promethazine -dextromethorphan (PROMETHAZINE -DM) 6.25-15 MG/5ML syrup, Take 5 mLs by mouth 3 (three) times daily as needed for cough., Disp: 118 mL, Rfl: 0   acetaminophen  (TYLENOL ) 325 MG tablet, Take 2 tablets (650 mg total) by mouth every 4 (four) hours as needed (for pain scale < 4)., Disp: 30 tablet, Rfl: 1   amoxicillin -clavulanate (AUGMENTIN ) 875-125 MG tablet, Take 1 tablet by mouth 2 (two) times daily., Disp: 14 tablet, Rfl: 0   cetirizine  (ZYRTEC  ALLERGY) 10 MG tablet, Take 1 tablet (10 mg total) by mouth daily for 10 days., Disp: 10 tablet, Rfl: 0   Cholecalciferol  (VITAMIN D ) 50 MCG (2000 UT) CAPS, Take 2,000 Units by mouth daily., Disp: , Rfl:     enalapril  (VASOTEC ) 10 MG tablet, Take 1 tablet (10 mg total) by mouth daily. (Patient taking differently: Take 20 mg by mouth daily. Per pt her MD increased to 20mg ), Disp: 30 tablet, Rfl: 0   enoxaparin  (LOVENOX ) 150 MG/ML injection, Inject 1 mL (150 mg total) into the skin every 12 (twelve) hours., Disp: 60 mL, Rfl: 0   fluticasone  (FLONASE ) 50 MCG/ACT nasal spray, Place 2 sprays into both nostrils daily., Disp: 16 g, Rfl: 6   hydroxychloroquine  (PLAQUENIL ) 200 MG tablet, Take 200 mg by mouth daily., Disp: , Rfl:    ibuprofen  (ADVIL ) 600 MG tablet, Take 1 tablet (600 mg total) by mouth every 6 (six) hours. (Patient not taking: Reported on 09/14/2023), Disp: 30 tablet, Rfl: 0   labetalol  (NORMODYNE ) 200 MG tablet, Take 3 tablets (600 mg total) by mouth every 6 (six) hours., Disp: 360 tablet, Rfl: 0   Prenatal Vit-Fe Fumarate-FA (PRENATAL MULTIVITAMIN) TABS tablet, Take 1 tablet by mouth daily at 12 noon., Disp: 30 tablet, Rfl: 0   sertraline  (ZOLOFT ) 50 MG tablet, Take 1 tablet by mouth daily., Disp: , Rfl:   Observations/Objective: Patient is well-developed, well-nourished in no acute distress.  Resting comfortably  at home.  Head is  normocephalic, atraumatic.  No labored breathing.  Speech is clear and coherent with logical content.  Patient is alert and oriented at baseline.  Coarse nonproductive cough  Assessment and Plan: 1. Acute bronchitis, unspecified organism (Primary) - benzonatate  (TESSALON ) 200 MG capsule; Take 1 capsule (200 mg total) by mouth 2 (two) times daily as needed for cough.  Dispense: 20 capsule; Refill: 0 - promethazine -dextromethorphan (PROMETHAZINE -DM) 6.25-15 MG/5ML syrup; Take 5 mLs by mouth 3 (three) times daily as needed for cough.  Dispense: 118 mL; Refill: 0 - methylPREDNISolone  (MEDROL  DOSEPAK) 4 MG TBPK tablet; Take by mouth as directed.  Dispense: 21 tablet; Refill: 0  - Take meds as prescribed - Use a cool mist humidifier  -Use saline nose sprays  frequently -Force fluids -For any cough or congestion  Use plain Mucinex - regular strength or max strength is fine -For fever or aces or pains- take tylenol  or ibuprofen . -Throat lozenges if help Pt states she can take medrol  instead of prednisone  -Follow up if symptoms worsen or do not improve   Follow Up Instructions: I discussed the assessment and treatment plan with the patient. The patient was provided an opportunity to ask questions and all were answered. The patient agreed with the plan and demonstrated an understanding of the instructions.  A copy of instructions were sent to the patient via MyChart unless otherwise noted below.     The patient was advised to call back or seek an in-person evaluation if the symptoms worsen or if the condition fails to improve as anticipated.    Hayley Learn, FNP

## 2024-07-19 ENCOUNTER — Telehealth: Admitting: Nurse Practitioner

## 2024-07-19 DIAGNOSIS — J111 Influenza due to unidentified influenza virus with other respiratory manifestations: Secondary | ICD-10-CM

## 2024-07-19 DIAGNOSIS — J209 Acute bronchitis, unspecified: Secondary | ICD-10-CM | POA: Diagnosis not present

## 2024-07-19 DIAGNOSIS — R051 Acute cough: Secondary | ICD-10-CM

## 2024-07-19 MED ORDER — PROMETHAZINE-DM 6.25-15 MG/5ML PO SYRP: 5.0000 mL | ORAL_SOLUTION | Freq: Four times a day (QID) | ORAL | 0 refills | Status: AC | PRN

## 2024-07-19 NOTE — Progress Notes (Signed)
 E visit for Flu like symptoms   We are sorry that you are not feeling well.  Here is how we plan to help! Based on what you have shared with me it looks like you may have a respiratory virus that may be influenza.  Influenza or the flu is  an infection caused by a respiratory virus. The flu virus is highly contagious and persons who did not receive their yearly flu vaccination may catch the flu from close contact.  We have anti-viral medications to treat the viruses that cause this infection. They are not a cure and only shorten the course of the infection. These prescriptions are most effective when they are given within the first 2 days of flu symptoms. Antiviral medications are indicated if you have a high risk of complications from the flu. You should  also consider an antiviral medication if you are in close contact with someone who is at risk. These medications can help patients avoid complications from the flu but have side effects that you should know.   Possible side effects from Tamiflu  or oseltamivir  include nausea, vomiting, diarrhea, dizziness, headaches, eye redness, sleep problems or other respiratory symptoms. You should not take Tamiflu  if you have an allergy to oseltamivir  or any to the ingredients in Tamiflu ..   For nasal congestion, you may use an oral decongestant such as Mucinex  D or if you have glaucoma or high blood pressure use plain Mucinex .  Saline nasal spray or nasal drops can help and can safely be used as often as needed for congestion.  If you have a sore or scratchy throat, use a saltwater gargle-  to  teaspoon of salt dissolved in a 4-ounce to 8-ounce glass of warm water.  Gargle the solution for approximately 15-30 seconds and then spit.  It is important not to swallow the solution.  You can also use throat lozenges/cough drops and Chloraseptic spray to help with throat pain or discomfort.  Warm or cold liquids can also be helpful in relieving throat  pain.  For headache, pain or general discomfort, you can use Ibuprofen  or Tylenol  as directed.   Some authorities believe that zinc sprays or the use of Echinacea may shorten the course of your symptoms.  I have prescribed the following medications to help lessen symptoms: I have prescribed Phenergan  DM 6.25 mg/15 mg. You make take one teaspoon / 5 ml every 4-6 hours as needed for cough  You are to isolate at home until you have been fever-free for at least 24 hours without a fever-reducing medication, and symptoms have been steadily improving for 24 hours.  If you must be around other household members who do not have symptoms, you need to make sure that both you and the family members are masking consistently with a high-quality mask.  If you note any worsening of symptoms despite treatment, please seek an in-person evaluation ASAP. If you note any significant shortness of breath or any chest pain, please seek ED evaluation. Please do not delay care!  ANYONE WHO HAS FLU SYMPTOMS SHOULD: Stay home. The flu is highly contagious and going out or to work exposes others! Be sure to drink plenty of fluids. Water is fine as well as fruit juices, sodas and electrolyte beverages. You may want to stay away from caffeine  or alcohol. If you are nauseated, try taking small sips of liquids. How do you know if you are getting enough fluid? Your urine should be a pale yellow or almost colorless. Get  rest. Taking a steamy shower or using a humidifier may help nasal congestion and ease sore throat pain. Using a saline nasal spray works much the same way. Cough drops, hard candies and sore throat lozenges may ease your cough. Line up a caregiver. Have someone check on you regularly.  GET HELP RIGHT AWAY IF: You cannot keep down liquids or your medications. You become short of breath Your fell like you are going to pass out or loose consciousness. Your symptoms persist after you have completed your treatment  plan  MAKE SURE YOU  Understand these instructions. Will watch your condition. Will get help right away if you are not doing well or get worse.  Your e-visit answers were reviewed by a board certified advanced clinical practitioner to complete your personal care plan.  Depending on the condition, your plan could have included both over the counter or prescription medications.  If there is a problem please reply  once you have received a response from your provider.  Your safety is important to us .  If you have drug allergies check your prescription carefully.    You can use MyChart to ask questions about todays visit, request a non-urgent call back, or ask for a work or school excuse for 24 hours related to this e-Visit. If it has been greater than 24 hours you will need to follow up with your provider, or enter a new e-Visit to address those concerns.  You will get an e-mail in the next two days asking about your experience.  I hope that your e-visit has been valuable and will speed your recovery. Thank you for using e-visits.   I have spent 5 minutes in review of e-visit questionnaire, review and updating patient chart, medical decision making and response to patient.   Marlise Fahr, FNP

## 2024-07-27 ENCOUNTER — Telehealth

## 2024-07-27 DIAGNOSIS — J40 Bronchitis, not specified as acute or chronic: Secondary | ICD-10-CM

## 2024-07-27 MED ORDER — AZITHROMYCIN 250 MG PO TABS: ORAL_TABLET | ORAL | 0 refills | Status: AC

## 2024-07-27 NOTE — Patient Instructions (Signed)
 " Hayley Webb, thank you for joining Hayley LELON Servant, NP for today's virtual visit.  While this provider is not your primary care provider (PCP), if your PCP is located in our provider database this encounter information will be shared with them immediately following your visit.   A [facility] MyChart account gives you access to today's visit and all your visits, tests, and labs performed at [facility]  click here if you don't have a [facility] MyChart account or go to [email or web address]  Consent: (Patient) Hayley Webb provided verbal consent for this virtual visit at the beginning of the encounter.  Current Medications:  Current Outpatient Medications:    azithromycin  (ZITHROMAX ) 250 MG tablet, Take 2 tablets on day 1, then 1 tablet daily on days 2 through 5, Disp: 6 tablet, Rfl: 0   benzonatate  (TESSALON ) 200 MG capsule, Take 1 capsule (200 mg total) by mouth 2 (two) times daily as needed for cough., Disp: 20 capsule, Rfl: 0   Cholecalciferol  (VITAMIN D ) 50 MCG (2000 UT) CAPS, Take 2,000 Units by mouth daily., Disp: , Rfl:    enalapril  (VASOTEC ) 10 MG tablet, Take 1 tablet (10 mg total) by mouth daily. (Patient taking differently: Take 20 mg by mouth daily. Per pt her MD increased to 20mg ), Disp: 30 tablet, Rfl: 0   enoxaparin  (LOVENOX ) 150 MG/ML injection, Inject 1 mL (150 mg total) into the skin every 12 (twelve) hours., Disp: 60 mL, Rfl: 0   fluticasone  (FLONASE ) 50 MCG/ACT nasal spray, Place 2 sprays into both nostrils daily., Disp: 16 g, Rfl: 6   hydroxychloroquine  (PLAQUENIL ) 200 MG tablet, Take 200 mg by mouth daily., Disp: , Rfl:    labetalol  (NORMODYNE ) 200 MG tablet, Take 3 tablets (600 mg total) by mouth every 6 (six) hours., Disp: 360 tablet, Rfl: 0   methylPREDNISolone  (MEDROL  DOSEPAK) 4 MG TBPK tablet, Take by mouth as directed., Disp: 21 tablet, Rfl: 0   Prenatal Vit-Fe Fumarate-FA (PRENATAL MULTIVITAMIN) TABS tablet, Take 1 tablet by mouth daily at 12  noon., Disp: 30 tablet, Rfl: 0   promethazine -dextromethorphan (PROMETHAZINE -DM) 6.25-15 MG/5ML syrup, Take 5 mLs by mouth 3 (three) times daily as needed for cough., Disp: 118 mL, Rfl: 0   promethazine -dextromethorphan (PROMETHAZINE -DM) 6.25-15 MG/5ML syrup, Take 5 mLs by mouth 4 (four) times daily as needed for up to 10 days for cough., Disp: 118 mL, Rfl: 0   sertraline  (ZOLOFT ) 50 MG tablet, Take 1 tablet by mouth daily., Disp: , Rfl:    Medications ordered in this encounter:  Meds ordered this encounter  Medications   azithromycin  (ZITHROMAX ) 250 MG tablet    Sig: Take 2 tablets on day 1, then 1 tablet daily on days 2 through 5    Dispense:  6 tablet    Refill:  0    Supervising Provider:   LAMPTEY, PHILIP O [phone]     *If you need refills on other medications prior to your next appointment, please contact your pharmacy*  Follow-Up: Call back or seek an in-person evaluation if the symptoms worsen or if the condition fails to improve as anticipated.  [facility] [phone]  Other Instructions INSTRUCTIONS: use a humidifier for nasal congestion Drink plenty of fluids, rest and wash hands frequently to avoid the spread of infection Alternate tylenol  and Motrin  for relief of fever    If you have been instructed to have an in-person evaluation today at a local Urgent Care facility, please use the link below. It will take  you to a list of all of our available [facility], including address, phone number and hours of operation. Please do not delay care.  [facility]  If you or a family member do not have a primary care provider, use the link below to schedule a visit and establish care. When you choose a [facility] primary care physician or advanced practice provider, you gain a long-term partner in health. Find a Primary Care Provider  Learn more about [facility] in-office and virtual care options: [facility] - Get Care Now  "

## 2024-07-27 NOTE — Progress Notes (Signed)
 " Virtual Visit Consent   Hayley Webb, you are scheduled for a virtual visit with a [facility] provider today. Just as with appointments in the office, your consent must be obtained to participate. Your consent will be active for this visit and any virtual visit you may have with one of our providers in the next 365 days. If you have a MyChart account, a copy of this consent can be sent to you electronically.  As this is a virtual visit, video technology does not allow for your provider to perform a traditional examination. This may limit your provider's ability to fully assess your condition. If your provider identifies any concerns that need to be evaluated in person or the need to arrange testing (such as labs, EKG, etc.), we will make arrangements to do so. Although advances in technology are sophisticated, we cannot ensure that it will always work on either your end or our end. If the connection with a video visit is poor, the visit may have to be switched to a telephone visit. With either a video or telephone visit, we are not always able to ensure that we have a secure connection.  By engaging in this virtual visit, you consent to the provision of healthcare and authorize for your insurance to be billed (if applicable) for the services provided during this visit. Depending on your insurance coverage, you may receive a charge related to this service.  I need to obtain your verbal consent now. Are you willing to proceed with your visit today? Hayley Webb has provided verbal consent on 07/27/2024 for a virtual visit (video or telephone). Hayley LELON Servant, NP  Date: 07/27/2024 10:44 AM   Virtual Visit via Video Note   I, Hayley Webb, connected with  Hayley Webb  ([id number], 1985/09/20) on 07/27/2024 at 10:30 AM EST by a video-enabled telemedicine application and verified that I am speaking with the correct person using two identifiers.  Location: Patient: Virtual Visit Location Patient:  Home Provider: Virtual Visit Location Provider: Home Office   I discussed the limitations of evaluation and management by telemedicine and the availability of in person appointments. The patient expressed understanding and agreed to proceed.    History of Present Illness: Hayley Webb is a 39 y.o. who identifies as a female who was assigned female at birth, and is being seen today for URI with cough and congestion .  Over the past 8 days Hayley Webb has been experiencing body aches, chills, sinus pressure, severe chest and nasal congestion and persistent deep productive cough. Negative for COVID and FLU. Currently taking promethazine  DM and using her albuterol  inhaler prn    Problems:  Patient Active Problem List   Diagnosis Date Noted   Crampy pain 04/29/2024   Discomfort 04/29/2024   Dizziness 04/29/2024   Dysuria 04/29/2024   Fatigue 04/29/2024   Increased frequency of urination 04/29/2024   Nausea 04/29/2024   Third trimester bleeding 04/29/2024   Threatened abortion in first trimester 04/29/2024   Urinary tract infectious disease 04/29/2024   Gestational hypertension w/o significant proteinuria in 3rd trimester 07/21/2023   History of pre-eclampsia in prior pregnancy, currently pregnant 04/06/2023   Chronic hypertension during pregnancy, antepartum 04/06/2023   Vitamin D  deficiency 08/11/2021   History of preterm delivery 08/08/2021   Essential hypertension 05/24/2020   Mixed hyperlipidemia 05/24/2020   Preterm labor 12/23/2018   Maternal obesity affecting pregnancy, antepartum 06/11/2018   History of multiple allergies 06/11/2018   On continuous oral anticoagulation 04/04/2017  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   Antiphospholipid antibody syndrome 02/10/2017   Drug therapy 09/28/2016   Hematuria, microscopic 09/20/2016   Proteinuria 09/20/2016   History of pulmonary embolus (PE) 07/24/2016   Lupus (systemic lupus erythematosus) (HCC) 05/30/2013    Symptom associated with female genital organs 09/20/2012   Lupus    HSV infection     Allergies: Allergies[1] Medications: Current Medications[2]  Observations/Objective: Patient is well-developed, well-nourished in no acute distress.  Resting comfortably at home.  Head is normocephalic, atraumatic.  No labored breathing. Speech is clear and coherent with logical content.  Patient is alert and oriented at baseline.    Assessment and Plan: 1. Bronchitis (Primary) - azithromycin  (ZITHROMAX ) 250 MG tablet; Take 2 tablets on day 1, then 1 tablet daily on days 2 through 5  Dispense: 6 tablet; Refill: 0  INSTRUCTIONS: use a humidifier for nasal congestion Drink plenty of fluids, rest and wash hands frequently to avoid the spread of infection Alternate tylenol  and Motrin  for relief of fever   Follow Up Instructions: I discussed the assessment and treatment plan with the patient. The patient was provided an opportunity to ask questions and all were answered. The patient agreed with the plan and demonstrated an understanding of the instructions.  A copy of instructions were sent to the patient via MyChart unless otherwise noted below.     The patient was advised to call back or seek an in-person evaluation if the symptoms worsen or if the condition fails to improve as anticipated.    Hayley LELON Servant, NP     [1]  Allergies Allergen Reactions   Bactrim Anaphylaxis and Hives   Hydrocodone  Anaphylaxis   Hydrocodone -Acetaminophen  Anaphylaxis and Other (See Comments)    acetaminophen  / hydrocodone    Nifedipine  Swelling and Anaphylaxis    Swelling of tongue   Nsaids Other (See Comments)    Has Lupus, reccommended to avoid NSAIDs   Pineapple Itching   Prednisone Anaphylaxis and Hives    Has been on Dexamethasone  without issue.   Shrimp Extract Itching   Pineapple Extract Itching    pineapple extract   Shellfish Protein-Containing Drug Products Itching    shellfish derived  [2]   Current Outpatient Medications:    azithromycin  (ZITHROMAX ) 250 MG tablet, Take 2 tablets on day 1, then 1 tablet daily on days 2 through 5, Disp: 6 tablet, Rfl: 0   benzonatate  (TESSALON ) 200 MG capsule, Take 1 capsule (200 mg total) by mouth 2 (two) times daily as needed for cough., Disp: 20 capsule, Rfl: 0   Cholecalciferol  (VITAMIN D ) 50 MCG (2000 UT) CAPS, Take 2,000 Units by mouth daily., Disp: , Rfl:    enalapril  (VASOTEC ) 10 MG tablet, Take 1 tablet (10 mg total) by mouth daily. (Patient taking differently: Take 20 mg by mouth daily. Per pt her MD increased to 20mg ), Disp: 30 tablet, Rfl: 0   enoxaparin  (LOVENOX ) 150 MG/ML injection, Inject 1 mL (150 mg total) into the skin every 12 (twelve) hours., Disp: 60 mL, Rfl: 0   fluticasone  (FLONASE ) 50 MCG/ACT nasal spray, Place 2 sprays into both nostrils daily., Disp: 16 g, Rfl: 6   hydroxychloroquine  (PLAQUENIL ) 200 MG tablet, Take 200 mg by mouth daily., Disp: , Rfl:    labetalol  (NORMODYNE ) 200 MG tablet, Take 3 tablets (600 mg total) by mouth every 6 (six) hours., Disp: 360 tablet, Rfl: 0   methylPREDNISolone  (MEDROL  DOSEPAK) 4 MG TBPK tablet, Take by mouth as directed., Disp: 21 tablet, Rfl: 0  Prenatal Vit-Fe Fumarate-FA (PRENATAL MULTIVITAMIN) TABS tablet, Take 1 tablet by mouth daily at 12 noon., Disp: 30 tablet, Rfl: 0   promethazine -dextromethorphan (PROMETHAZINE -DM) 6.25-15 MG/5ML syrup, Take 5 mLs by mouth 3 (three) times daily as needed for cough., Disp: 118 mL, Rfl: 0   promethazine -dextromethorphan (PROMETHAZINE -DM) 6.25-15 MG/5ML syrup, Take 5 mLs by mouth 4 (four) times daily as needed for up to 10 days for cough., Disp: 118 mL, Rfl: 0   sertraline  (ZOLOFT ) 50 MG tablet, Take 1 tablet by mouth daily., Disp: , Rfl:   "

## 2024-08-14 ENCOUNTER — Ambulatory Visit: Payer: Self-pay

## 2024-08-14 ENCOUNTER — Telehealth: Admitting: Family

## 2024-08-14 DIAGNOSIS — N898 Other specified noninflammatory disorders of vagina: Secondary | ICD-10-CM

## 2024-08-14 NOTE — Progress Notes (Signed)
" °  Because of your urinary frequency and back pain and vaginal discharge, I feel your condition warrants further evaluation and I recommend that you be seen in a face-to-face visit.   NOTE: There will be NO CHARGE for this E-Visit   If you are having a true medical emergency, please call 911.     For an urgent face to face visit, [facility] has multiple urgent care centers for your convenience.  Click the link below for the full list of locations and hours, walk-in wait times, appointment scheduling options and driving directions:  Urgent Care - [place], [place], [place], [place], [place], [place]  [place]     Your MyChart E-visit questionnaire answers were reviewed by a board certified advanced clinical practitioner to complete your personal care plan based on your specific symptoms.    Thank you for using e-Visits.    "

## 2024-08-14 NOTE — Telephone Encounter (Signed)
"   FYI Only or Action Required?: FYI only for provider: appointment scheduled on 08/16/23.  Patient was last seen in primary care on 07/27/2024 by Theotis Haze ORN, NP.  Called Nurse Triage reporting Back Pain.  Symptoms began unknown.  Interventions attempted: OTC medications: Tylenol , Prescription medications: cyclobenzaprine , and Ice/heat application.  Symptoms are: gradually worsening.  Triage Disposition: See PCP When Office is Open (Within 3 Days)  Patient/caregiver understands and will follow disposition?: Yes  Message from [place] C sent at 08/14/2024  4:40 PM EST  Reason for Triage: Patient states she is having extreme pain in her back close to the shoulder area.   Reason for Disposition  [1] MODERATE back pain (e.g., interferes with normal activities) AND [2] present > 3 days  Answer Assessment - Initial Assessment Questions Patient reports pain where the bra strap is on her back causing difficulty with lifting the left arm more than the right, redness to the area of the bra strap. She's been taking old cyclobenzaprine  and using a heating pad to relieve the pain as well as Tylenol . Advised availability tomorrow with Dr. Levora, she agreed to that appointment. Advised to speak with the receptionist tomorrow regarding re-establishing care at [facility]. She verbalized understanding.    1. ONSET: When did the pain begin? (e.g., minutes, hours, days)     Unure  2. LOCATION: Where does it hurt? (upper, mid or lower back)     Upper/mid back, shoulders  3. SEVERITY: How bad is the pain?  (e.g., Scale 1-10; mild, moderate, or severe)     9-10 when it's there  4. PATTERN: Is the pain constant? (e.g., yes, no; constant, intermittent)      Constant at times  5. RADIATION: Does the pain shoot into your legs or somewhere else?     Both shoulders  6. CAUSE:  What do you think is causing the back pain?      Heavy breasts  7. BACK OVERUSE:  Any recent lifting of heavy  objects, strenuous work or exercise?     No  8. MEDICINES: What have you taken so far for the pain? (e.g., nothing, acetaminophen , NSAIDS)     Tylenol , cyclobenzaprine    10. OTHER SYMPTOMS: Do you have any other symptoms? (e.g., fever, abdomen pain, burning with urination, blood in urine)       No  Protocols used: Back Pain-A-AH  "

## 2024-08-15 ENCOUNTER — Other Ambulatory Visit

## 2024-08-15 ENCOUNTER — Ambulatory Visit: Admitting: Family Medicine

## 2024-08-15 VITALS — BP 170/125 | HR 77 | Temp 97.8°F | Resp 18 | Ht 68.5 in | Wt 324.2 lb

## 2024-08-15 DIAGNOSIS — R350 Frequency of micturition: Secondary | ICD-10-CM | POA: Diagnosis not present

## 2024-08-15 DIAGNOSIS — N898 Other specified noninflammatory disorders of vagina: Secondary | ICD-10-CM | POA: Diagnosis not present

## 2024-08-15 DIAGNOSIS — M542 Cervicalgia: Secondary | ICD-10-CM

## 2024-08-15 DIAGNOSIS — M549 Dorsalgia, unspecified: Secondary | ICD-10-CM

## 2024-08-15 DIAGNOSIS — I10 Essential (primary) hypertension: Secondary | ICD-10-CM | POA: Diagnosis not present

## 2024-08-15 LAB — POCT URINALYSIS DIPSTICK
Bilirubin, UA: NEGATIVE
Blood, UA: 25
Glucose, UA: NEGATIVE
Ketones, UA: NEGATIVE
Leukocytes, UA: NEGATIVE
Nitrite, UA: NEGATIVE
Protein, UA: NEGATIVE
Spec Grav, UA: >=1.03 — AB
Urobilinogen, UA: 0.2 U/dL
pH, UA: 6

## 2024-08-15 LAB — BASIC METABOLIC PANEL WITH GFR
BUN: 15 mg/dL (ref 6–23)
CO2: 28 meq/L (ref 19–32)
Calcium: 9.6 mg/dL (ref 8.4–10.5)
Chloride: 105 meq/L (ref 96–112)
Creatinine, Ser: 0.68 mg/dL (ref 0.40–1.20)
GFR: 111.96 mL/min
Glucose, Bld: 103 mg/dL — ABNORMAL HIGH (ref 70–99)
Potassium: 4.1 meq/L (ref 3.5–5.1)
Sodium: 138 meq/L (ref 135–145)

## 2024-08-15 MED ORDER — CYCLOBENZAPRINE HCL 5 MG PO TABS: 5.0000 mg | ORAL_TABLET | Freq: Three times a day (TID) | ORAL | 1 refills | Status: AC | PRN

## 2024-08-15 MED ORDER — FLUCONAZOLE 150 MG PO TABS: 150.0000 mg | ORAL_TABLET | Freq: Once | ORAL | 0 refills | Status: AC

## 2024-08-15 NOTE — Patient Instructions (Addendum)
 I do not see any sign of urinary tract infection today, but I will check a urine culture.  I have also checked a test for possible yeast infection or bacterial vaginosis, but with recent antibiotics I think it would be reasonable to try 1 dose of Diflucan  for possible yeast infection with the vaginal irritation.  Depending on lab results and if those symptoms persist, we can discuss other testing or treatment.  Blood pressure is elevated today, but without any new symptoms I think we continue to monitor outpatient and see if that will improve with restart of medications today.  Make sure you do not miss any doses of meds.  Bring a copy of your blood pressures from home to the next visit in 1 week.  I am checking a kidney test and electrolytes as well today.   Have x-ray for your neck and upper back pain at the [facility] location below.  I will let you know if there are any concerns.  Flexeril  up to 3 times per day if needed for now, but be careful with that medication as it can cause sedation, should not drive or operate machinery.    [facility] or xray: Walk in 8:30-4:30 during weekdays, no appointment needed [address]  [place], [place] [id number]

## 2024-08-15 NOTE — Telephone Encounter (Signed)
 Patient states she is having extreme pain in her back close to the shoulder area. - appt with you today

## 2024-08-15 NOTE — Progress Notes (Signed)
 "  Subjective:  Patient ID: Hayley Webb, female    DOB: [birth year]  Age: 39 y.o. MRN: [id number]  CC:  Chief Complaint  Patient presents with   Back Pain    Sx started 5-6 months ago. No know injury. Started after you stopped breast feeding. Breast feel very heavy and causing upper shoulder and back pain. Patient is interested in establishing care with Dr. Jerrell.    Urinary Frequency    Lower back pain. Vaginal burning.     HPI Hayley Webb presents for acute visit for above conditions. No current PCP - prior patient of [employer], plans to establish with Dr. Jerrell.    Back pain Upper back past 5-6 months, NKI. No new exercises. Noticed some when breast feeding, more after stopped breastfeeding. 1yo son Hayley Webb. Pain bt shoulder blades and in back of neck. Pain into left or r arm at times - episodic. No weakness.  No neck surgery/injection/prior treatment.  Took leftover flexeril   few times. Hx of lupus. Once per month with pain in arms.  Did feel better when on medrol  in November for bronchiitis.  Avoids NSAIDs with lupus.  Urinary frequency with low back pain, vaginal burning Started early last week. Frequent urination and dysruia. Some improvement with Azo for 3-4 days, then sx's returned. No n/v/abd pain. Some low back pain past week. Vaginal d/c at times, no new sexual contacts.  Clear vaginal d/c. Some burning in vagina. Had abx 2 weeks ago for respiratory issue.    Hypertension: Missed yesterday's dose of meds. OBGYN has been managing her meds. No recent changes.  Enalapril  10mg  every day and labetalol  600mg  tid. Has taken 1 dose of meds today.  No HA, CP, focal weakness, or vision changes.  Home readings on meds - 140/90 range.  BP Readings from Last 3 Encounters:  08/15/24 (!) 170/125  10/25/23 (!) 155/112  07/28/23 136/83   Lab Results  Component Value Date   CREATININE 0.63 07/24/2023      History Patient Active Problem List   Diagnosis Date Noted    Crampy pain 04/29/2024   Discomfort 04/29/2024   Dizziness 04/29/2024   Dysuria 04/29/2024   Fatigue 04/29/2024   Increased frequency of urination 04/29/2024   Nausea 04/29/2024   Third trimester bleeding 04/29/2024   Threatened abortion in first trimester 04/29/2024   Urinary tract infectious disease 04/29/2024   Gestational hypertension w/o significant proteinuria in 3rd trimester 07/21/2023   History of pre-eclampsia in prior pregnancy, currently pregnant 04/06/2023   Chronic hypertension during pregnancy, antepartum 04/06/2023   Vitamin D  deficiency 08/11/2021   History of preterm delivery 08/08/2021   Essential hypertension 05/24/2020   Mixed hyperlipidemia 05/24/2020   Preterm labor 12/23/2018   Maternal obesity affecting pregnancy, antepartum 06/11/2018   History of multiple allergies 06/11/2018   On continuous oral anticoagulation 04/04/2017   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   Antiphospholipid antibody syndrome 02/10/2017   Drug therapy 09/28/2016   Hematuria, microscopic 09/20/2016   Proteinuria 09/20/2016   History of pulmonary embolus (PE) 07/24/2016   Lupus (systemic lupus erythematosus) (HCC) 05/30/2013   Symptom associated with female genital organs 09/20/2012   Lupus    HSV infection    Past Medical History:  Diagnosis Date   Allergy to shellfish 06/11/2018   Altered thought processes 04/04/2017   r/o embolic event     Anxiety    doing good now   Anxiety disorder 12/23/2018   Asthma    Chest pain 04/04/2017  Chronic hypertension affecting pregnancy 07/25/2018   Chronic hypertension complicating or reason for care during pregnancy, third trimester 01/12/2019   COVID 07/2021   mild   Depression    doing good   Depressive disorder 12/23/2018   Headache(784.0)    Hirsutism 10/02/2012   HSV infection    Hypertension    was taken off meds after last preg   Infection    UTI   Knee pain 01/12/2020   Left knee pain 10/20/2020   Lupus     dx age 69   Nonintractable headache 04/04/2017   Normal postpartum course 01/17/2019   Obesity-BMI 47 05/09/2013   Pneumonia    blood clot appeared during the pneumonia   Postpartum anemia 01/17/2019   Preeclampsia 01/25/2022   Pregnant 07/01/2018   Pulmonary embolism (HCC) 06/4/82011   Pulmonary embolus (HCC) 02/09/2017   Right knee pain 10/12/2020   Shortness of breath    Sicca syndrome 08/27/2018   STD (female) 01/18/2012   Hx GC 8/10  Hx Chlamydia 6/13     STD (sexually transmitted disease) 02/2009   POSITIVE GC   Threatened preterm labor 12/25/2018   Threatened preterm labor, third trimester 12/23/2018   UTI (urinary tract infection)    Word finding difficulty 04/04/2017   Past Surgical History:  Procedure Laterality Date   ADENOIDECTOMY     TONSILLECTOMY AND ADENOIDECTOMY  1998   WISDOM TOOTH EXTRACTION     XI ROBOTIC ASSISTED SALPINGECTOMY Bilateral 10/25/2023   Procedure: SALPINGECTOMY, ROBOT-ASSISTED;  Surgeon: Gloriann Chick, MD;  Location: [facility] OR;  Service: Gynecology;  Laterality: Bilateral;   Allergies[1] Prior to Admission medications  Medication Sig Start Date End Date Taking? Authorizing Provider  Cholecalciferol  (VITAMIN D ) 50 MCG (2000 UT) CAPS Take 2,000 Units by mouth daily.   Yes [provider]  enalapril  (VASOTEC ) 10 MG tablet Take 1 tablet (10 mg total) by mouth daily. Patient taking differently: Take 20 mg by mouth daily. Per pt her MD increased to 20mg  07/29/23  Yes Dillard, Naima, MD  enoxaparin  (LOVENOX ) 150 MG/ML injection Inject 1 mL (150 mg total) into the skin every 12 (twelve) hours. 07/28/23  Yes Dillard, Naima, MD  fluticasone  (FLONASE ) 50 MCG/ACT nasal spray Place 2 sprays into both nostrils daily. 02/25/24  Yes McLean, Darius, PA-C  hydroxychloroquine  (PLAQUENIL ) 200 MG tablet Take 200 mg by mouth daily.   Yes [provider]  labetalol  (NORMODYNE ) 200 MG tablet Take 3 tablets (600 mg total) by mouth every 6 (six) hours. 07/28/23  Yes  Dillard, Ovid, MD  Prenatal Vit-Fe Fumarate-FA (PRENATAL MULTIVITAMIN) TABS tablet Take 1 tablet by mouth daily at 12 noon. 07/29/23  Yes Dillard, Naima, MD  benzonatate  (TESSALON ) 200 MG capsule Take 1 capsule (200 mg total) by mouth 2 (two) times daily as needed for cough. Patient not taking: Reported on 08/15/2024 06/08/24   Lavell Lye A, FNP  methylPREDNISolone  (MEDROL  DOSEPAK) 4 MG TBPK tablet Take by mouth as directed. 06/08/24   Lavell Lye LABOR, FNP  promethazine -dextromethorphan (PROMETHAZINE -DM) 6.25-15 MG/5ML syrup Take 5 mLs by mouth 3 (three) times daily as needed for cough. Patient not taking: Reported on 08/15/2024 06/08/24   Lavell Lye A, FNP  sertraline  (ZOLOFT ) 50 MG tablet Take 1 tablet by mouth daily. Patient not taking: Reported on 08/15/2024 10/01/23   [provider]   Social History   Socioeconomic History   Marital status: Married    Spouse name: Neville   Number of children: Not on file   Years  of education: Not on file   Highest education level: Some college, no degree  Occupational History   Not on file  Tobacco Use   Smoking status: Never   Smokeless tobacco: Never  Vaping Use   Vaping status: Never Used  Substance and Sexual Activity   Alcohol use: Not Currently    Comment: occsinally   Drug use: No   Sexual activity: Not Currently    Partners: Male    Birth control/protection: None  Other Topics Concern   Not on file  Social History Narrative   Not on file   Social Drivers of Health   Tobacco Use: Low Risk (08/15/2024)   Patient History    Smoking Tobacco Use: Never    Smokeless Tobacco Use: Never    Passive Exposure: Not on file  Financial Resource Strain: Low Risk (08/15/2024)   Overall Financial Resource Strain (CARDIA)    Difficulty of Paying Living Expenses: Not hard at all  Food Insecurity: No Food Insecurity (08/15/2024)   Epic    Worried About Radiation Protection Practitioner of Food in the Last Year: Never true    Ran Out of Food in the  Last Year: Never true  Transportation Needs: No Transportation Needs (08/15/2024)   Epic    Lack of Transportation (Medical): No    Lack of Transportation (Non-Medical): No  Physical Activity: Sufficiently Active (08/15/2024)   Exercise Vital Sign    Days of Exercise per Week: 5 days    Minutes of Exercise per Session: 60 min  Stress: No Stress Concern Present (08/15/2024)   [employer] of Occupational Health - Occupational Stress Questionnaire    Feeling of Stress: Only a little  Social Connections: Socially Integrated (08/15/2024)   Social Connection and Isolation Panel    Frequency of Communication with Friends and Family: More than three times a week    Frequency of Social Gatherings with Friends and Family: Once a week    Attends Religious Services: More than 4 times per year    Active Member of [employer] or Organizations: Yes    Attends Engineer, Structural: More than 4 times per year    Marital Status: Married  Catering Manager Violence: Not At Risk (07/20/2023)   Humiliation, Afraid, Rape, and Kick questionnaire    Fear of Current or Ex-Partner: No    Emotionally Abused: No    Physically Abused: No    Sexually Abused: No  Depression (PHQ2-9): Low Risk (08/15/2024)   Depression (PHQ2-9)    PHQ-2 Score: 0  Alcohol Screen: Low Risk (08/15/2024)   Alcohol Screen    Last Alcohol Screening Score (AUDIT): 1  Housing: Low Risk (08/15/2024)   Epic    Unable to Pay for Housing in the Last Year: No    Number of Times Moved in the Last Year: 0    Homeless in the Last Year: No  Utilities: Low Risk (09/10/2023)   Received from [facility]   Utilities    In the past 12 months has the electric, gas, oil, or water company threatened to shut off services in your home? : No  Health Literacy: Not on file    Review of Systems Per HPI.   Objective:   Vitals:   08/15/24 0925 08/15/24 0932  BP: (!) 154/120 (!) 170/125  Pulse: 77   Resp: 18   Temp: 97.8 F (36.6 C)    TempSrc: Temporal   SpO2: 99%   Weight: (!) 324 lb 3.2 oz (147.1 kg)  Height: 5' 8.5 (1.74 m)      Physical Exam Vitals reviewed.  Constitutional:      Appearance: Normal appearance. She is well-developed.  HENT:     Head: Normocephalic and atraumatic.  Eyes:     Conjunctiva/sclera: Conjunctivae normal.     Pupils: Pupils are equal, round, and reactive to light.  Neck:     Vascular: No carotid bruit.  Cardiovascular:     Rate and Rhythm: Normal rate and regular rhythm.     Heart sounds: Normal heart sounds.  Pulmonary:     Effort: Pulmonary effort is normal.     Breath sounds: Normal breath sounds.  Abdominal:     General: Abdomen is flat.     Palpations: Abdomen is soft. There is no pulsatile mass.     Tenderness: There is no abdominal tenderness. There is no right CVA tenderness, left CVA tenderness or guarding.  Musculoskeletal:     Right lower leg: No edema.     Left lower leg: No edema.     Comments: C-spine, no midline bony tenderness, pain-free range of motion.  Upper extremity strength, grip strength equal bilaterally.  No focal weakness.  Lumbar spine nontender.  Skin:    General: Skin is warm and dry.  Neurological:     Mental Status: She is alert and oriented to person, place, and time.  Psychiatric:        Mood and Affect: Mood normal.        Behavior: Behavior normal.       Results for orders placed or performed in visit on 08/15/24  POCT urinalysis dipstick   Collection Time: 08/15/24  9:43 AM  Result Value Ref Range   Color, UA yellow/organge    Clarity, UA hazy    Glucose, UA Negative Negative   Bilirubin, UA Negative    Ketones, UA Negative    Spec Grav, UA >=1.030 (A) 1.010 - 1.025   Blood, UA 25    pH, UA 6.0 5.0 - 8.0   Protein, UA Negative Negative   Urobilinogen, UA 0.2 0.2 or 1.0 E.U./dL   Nitrite, UA Negative    Leukocytes, UA Negative Negative   Appearance     Odor      Assessment & Plan:  Hayley Webb is a 39 y.o.  female . Urinary frequency - Plan: POCT urinalysis dipstick, fluconazole  (DIFLUCAN ) 150 MG tablet, Urine Culture, SureSwab Advanced Vaginitis Plus,TMA Vaginal irritation - Plan: fluconazole  (DIFLUCAN ) 150 MG tablet, Urine Culture, SureSwab Advanced Vaginitis Plus,TMA Vaginal discharge - Plan: Urine Culture, SureSwab Advanced Vaginitis Plus,TMA  - Vaginal irritation with urinary symptoms above, but reassuring in office urinalysis.  Less likely UTI.  Check urine culture, check vaginitis swab to eval for BV or candidal vaginitis, but given her symptoms and recent antibiotics I think it would be reasonable to try treatment initially for Candida with Diflucan  150 mg x 1.  Monitor for lab results and adjust plan accordingly with RTC precautions given.  Neck pain - Plan: DG Cervical Spine Complete, cyclobenzaprine  (FLEXERIL ) 5 MG tablet Upper back pain - Plan: DG Cervical Spine Complete, cyclobenzaprine  (FLEXERIL ) 5 MG tablet  - Ongoing symptoms as above, overall reassuring exam.  Check imaging of C-spine, gentle range of motion, short-term trial of Flexeril  and recheck in the next week or so to decide on next step.  Could consider meeting with spine specialist, trial of PT.  Hold NSAIDs for reasons above.  Essential hypertension - Plan: Basic metabolic panel with GFR  -  Uncontrolled but missed medication yesterday.  Asymptomatic at this time.  Check a BMP, restart usual regimen and close follow-up with home readings in the next week to decide on adjustment of plan.  Initially had plan to follow-up with other physician for establishing care, but can discuss this at her follow-up as I may be able to follow her moving forward.  Can discuss at next visit.  Meds ordered this encounter  Medications   fluconazole  (DIFLUCAN ) 150 MG tablet    Sig: Take 1 tablet (150 mg total) by mouth once for 1 dose.    Dispense:  1 tablet    Refill:  0   cyclobenzaprine  (FLEXERIL ) 5 MG tablet    Sig: Take 1 tablet (5 mg  total) by mouth 3 (three) times daily as needed for muscle spasms (start qhs prn due to sedation).    Dispense:  15 tablet    Refill:  1   Patient Instructions  I do not see any sign of urinary tract infection today, but I will check a urine culture.  I have also checked a test for possible yeast infection or bacterial vaginosis, but with recent antibiotics I think it would be reasonable to try 1 dose of Diflucan  for possible yeast infection with the vaginal irritation.  Depending on lab results and if those symptoms persist, we can discuss other testing or treatment.  Blood pressure is elevated today, but without any new symptoms I think we continue to monitor outpatient and see if that will improve with restart of medications today.  Make sure you do not miss any doses of meds.  Bring a copy of your blood pressures from home to the next visit in 1 week.  I am checking a kidney test and electrolytes as well today.   Have x-ray for your neck and upper back pain at the [facility] location below.  I will let you know if there are any concerns.  Flexeril  up to 3 times per day if needed for now, but be careful with that medication as it can cause sedation, should not drive or operate machinery.    [facility] or xray: Walk in 8:30-4:30 during weekdays, no appointment needed [address]  [place], [place] [id number]     Signed,   Reyes Pines, MD [facility], [facility] 08/15/24 10:31 AM      [1]  Allergies Allergen Reactions   Bactrim Anaphylaxis and Hives   Hydrocodone  Anaphylaxis   Hydrocodone -Acetaminophen  Anaphylaxis and Other (See Comments)    acetaminophen  / hydrocodone    Nifedipine  Swelling and Anaphylaxis    Swelling of tongue   Nsaids Other (See Comments)    Has Lupus, reccommended to avoid NSAIDs   Pineapple Itching   Prednisone Anaphylaxis and Hives    Has been on Dexamethasone  without issue.   Shrimp Extract Itching    Pineapple Extract Itching    pineapple extract   Shellfish Protein-Containing Drug Products Itching    shellfish derived   "

## 2024-08-17 LAB — SURESWAB® ADVANCED VAGINITIS PLUS,TMA
C. trachomatis RNA, TMA: NOT DETECTED
CANDIDA SPECIES: NOT DETECTED
Candida glabrata: NOT DETECTED
N. gonorrhoeae RNA, TMA: NOT DETECTED
SURESWAB(R) ADV BACTERIAL VAGINOSIS(BV),TMA: NEGATIVE
TRICHOMONAS VAGINALIS (TV),TMA: NOT DETECTED

## 2024-08-17 LAB — URINE CULTURE
MICRO NUMBER:: 17508641
Result:: NO GROWTH
SPECIMEN QUALITY:: ADEQUATE

## 2024-08-18 ENCOUNTER — Ambulatory Visit: Payer: Self-pay | Admitting: Family Medicine

## 2024-08-21 ENCOUNTER — Ambulatory Visit: Admitting: Family Medicine
